# Patient Record
Sex: Female | Born: 1940 | Race: Black or African American | Hispanic: No | Smoking: Never smoker
Health system: Southern US, Community
[De-identification: ages and names within clinical notes are randomized; demographics above are authoritative.]

## PROBLEM LIST (undated history)

## (undated) DIAGNOSIS — I639 Cerebral infarction, unspecified: Secondary | ICD-10-CM

## (undated) DIAGNOSIS — I63512 Cerebral infarction due to unspecified occlusion or stenosis of left middle cerebral artery: Secondary | ICD-10-CM

## (undated) DIAGNOSIS — I071 Rheumatic tricuspid insufficiency: Secondary | ICD-10-CM

## (undated) DIAGNOSIS — I5032 Chronic diastolic (congestive) heart failure: Secondary | ICD-10-CM

## (undated) DIAGNOSIS — K8689 Other specified diseases of pancreas: Secondary | ICD-10-CM

## (undated) DIAGNOSIS — R001 Bradycardia, unspecified: Secondary | ICD-10-CM

## (undated) DIAGNOSIS — Z9889 Other specified postprocedural states: Secondary | ICD-10-CM

## (undated) DIAGNOSIS — I10 Essential (primary) hypertension: Secondary | ICD-10-CM

## (undated) DIAGNOSIS — I4892 Unspecified atrial flutter: Secondary | ICD-10-CM

## (undated) DIAGNOSIS — I340 Nonrheumatic mitral (valve) insufficiency: Secondary | ICD-10-CM

## (undated) DIAGNOSIS — R4701 Aphasia: Secondary | ICD-10-CM

## (undated) DIAGNOSIS — S7292XA Unspecified fracture of left femur, initial encounter for closed fracture: Secondary | ICD-10-CM

## (undated) DIAGNOSIS — I499 Cardiac arrhythmia, unspecified: Secondary | ICD-10-CM

## (undated) DIAGNOSIS — R160 Hepatomegaly, not elsewhere classified: Secondary | ICD-10-CM

## (undated) DIAGNOSIS — M1990 Unspecified osteoarthritis, unspecified site: Secondary | ICD-10-CM

## (undated) DIAGNOSIS — E785 Hyperlipidemia, unspecified: Secondary | ICD-10-CM

## (undated) HISTORY — DX: Nonrheumatic mitral (valve) insufficiency: I34.0

## (undated) HISTORY — PX: HIP SURGERY: SHX245

## (undated) HISTORY — PX: TOTAL KNEE ARTHROPLASTY: SHX125

## (undated) HISTORY — PX: LUMBAR SPINE SURGERY: SHX701

## (undated) HISTORY — DX: Cerebral infarction, unspecified: I63.9

## (undated) HISTORY — DX: Unspecified fracture of left femur, initial encounter for closed fracture: S72.92XA

## (undated) HISTORY — DX: Other specified diseases of pancreas: K86.89

## (undated) HISTORY — DX: Rheumatic tricuspid insufficiency: I07.1

## (undated) HISTORY — DX: Unspecified atrial flutter: I48.92

## (undated) HISTORY — DX: Hepatomegaly, not elsewhere classified: R16.0

## (undated) HISTORY — PX: KNEE ARTHROSCOPY: SUR90

## (undated) HISTORY — PX: TUBAL LIGATION: SHX77

## (undated) HISTORY — DX: Bradycardia, unspecified: R00.1

## (undated) HISTORY — DX: Cerebral infarction due to unspecified occlusion or stenosis of left middle cerebral artery: I63.512

## (undated) HISTORY — DX: Aphasia: R47.01

## (undated) HISTORY — DX: Unspecified osteoarthritis, unspecified site: M19.90

## (undated) HISTORY — DX: Essential (primary) hypertension: I10

## (undated) HISTORY — DX: Hyperlipidemia, unspecified: E78.5

## (undated) HISTORY — PX: JOINT REPLACEMENT: SHX530

---

## 1998-10-09 ENCOUNTER — Other Ambulatory Visit: Admission: RE | Admit: 1998-10-09 | Discharge: 1998-10-09 | Payer: Self-pay | Admitting: Family Medicine

## 1999-11-12 ENCOUNTER — Other Ambulatory Visit: Admission: RE | Admit: 1999-11-12 | Discharge: 1999-11-12 | Payer: Self-pay | Admitting: Family Medicine

## 2001-08-24 ENCOUNTER — Other Ambulatory Visit: Admission: RE | Admit: 2001-08-24 | Discharge: 2001-08-24 | Payer: Self-pay | Admitting: Family Medicine

## 2006-03-05 ENCOUNTER — Other Ambulatory Visit: Admission: RE | Admit: 2006-03-05 | Discharge: 2006-03-05 | Payer: Self-pay | Admitting: Family Medicine

## 2006-06-10 ENCOUNTER — Inpatient Hospital Stay (HOSPITAL_COMMUNITY): Admission: RE | Admit: 2006-06-10 | Discharge: 2006-06-13 | Payer: Self-pay | Admitting: Orthopedic Surgery

## 2006-07-06 ENCOUNTER — Encounter: Admission: RE | Admit: 2006-07-06 | Discharge: 2006-08-06 | Payer: Self-pay | Admitting: Orthopedic Surgery

## 2006-08-07 ENCOUNTER — Encounter: Admission: RE | Admit: 2006-08-07 | Discharge: 2006-09-10 | Payer: Self-pay | Admitting: Orthopedic Surgery

## 2006-12-17 ENCOUNTER — Emergency Department (HOSPITAL_COMMUNITY): Admission: EM | Admit: 2006-12-17 | Discharge: 2006-12-17 | Payer: Self-pay | Admitting: Emergency Medicine

## 2007-04-20 ENCOUNTER — Inpatient Hospital Stay (HOSPITAL_COMMUNITY): Admission: RE | Admit: 2007-04-20 | Discharge: 2007-04-21 | Payer: Self-pay | Admitting: Neurosurgery

## 2007-10-07 ENCOUNTER — Ambulatory Visit: Payer: Self-pay | Admitting: Gastroenterology

## 2007-10-19 ENCOUNTER — Ambulatory Visit: Payer: Self-pay | Admitting: Gastroenterology

## 2007-10-19 ENCOUNTER — Encounter: Payer: Self-pay | Admitting: Gastroenterology

## 2008-06-14 ENCOUNTER — Ambulatory Visit: Payer: Self-pay | Admitting: Cardiology

## 2008-07-11 ENCOUNTER — Encounter: Payer: Self-pay | Admitting: Cardiology

## 2008-07-11 ENCOUNTER — Ambulatory Visit: Payer: Self-pay

## 2008-08-16 ENCOUNTER — Ambulatory Visit: Payer: Self-pay | Admitting: Cardiology

## 2009-01-04 DIAGNOSIS — E785 Hyperlipidemia, unspecified: Secondary | ICD-10-CM | POA: Insufficient documentation

## 2009-01-04 DIAGNOSIS — I10 Essential (primary) hypertension: Secondary | ICD-10-CM | POA: Insufficient documentation

## 2009-01-09 ENCOUNTER — Ambulatory Visit: Payer: Self-pay | Admitting: Cardiology

## 2009-01-09 DIAGNOSIS — I080 Rheumatic disorders of both mitral and aortic valves: Secondary | ICD-10-CM | POA: Insufficient documentation

## 2009-01-09 DIAGNOSIS — E663 Overweight: Secondary | ICD-10-CM | POA: Insufficient documentation

## 2009-01-09 DIAGNOSIS — R001 Bradycardia, unspecified: Secondary | ICD-10-CM | POA: Insufficient documentation

## 2009-03-21 ENCOUNTER — Encounter: Payer: Self-pay | Admitting: Cardiology

## 2009-04-18 ENCOUNTER — Encounter (INDEPENDENT_AMBULATORY_CARE_PROVIDER_SITE_OTHER): Payer: Self-pay | Admitting: *Deleted

## 2009-06-26 ENCOUNTER — Ambulatory Visit: Payer: Self-pay | Admitting: Cardiology

## 2009-06-26 ENCOUNTER — Ambulatory Visit (HOSPITAL_COMMUNITY): Admission: RE | Admit: 2009-06-26 | Discharge: 2009-06-26 | Payer: Self-pay | Admitting: Cardiology

## 2009-06-26 ENCOUNTER — Ambulatory Visit: Payer: Self-pay

## 2009-06-26 ENCOUNTER — Encounter: Payer: Self-pay | Admitting: Cardiology

## 2009-07-10 ENCOUNTER — Telehealth: Payer: Self-pay | Admitting: Cardiology

## 2009-07-10 ENCOUNTER — Encounter: Payer: Self-pay | Admitting: Cardiology

## 2009-07-11 ENCOUNTER — Telehealth: Payer: Self-pay | Admitting: Cardiology

## 2009-07-31 ENCOUNTER — Telehealth: Payer: Self-pay | Admitting: Cardiology

## 2009-08-01 ENCOUNTER — Telehealth: Payer: Self-pay | Admitting: Cardiology

## 2009-08-07 ENCOUNTER — Encounter: Payer: Self-pay | Admitting: Cardiology

## 2009-08-15 ENCOUNTER — Ambulatory Visit: Payer: Self-pay | Admitting: Cardiology

## 2009-09-03 ENCOUNTER — Telehealth: Payer: Self-pay | Admitting: Cardiology

## 2009-12-06 ENCOUNTER — Telehealth (INDEPENDENT_AMBULATORY_CARE_PROVIDER_SITE_OTHER): Payer: Self-pay | Admitting: *Deleted

## 2009-12-10 ENCOUNTER — Encounter: Payer: Self-pay | Admitting: Cardiology

## 2009-12-10 ENCOUNTER — Telehealth: Payer: Self-pay | Admitting: Cardiology

## 2009-12-26 ENCOUNTER — Ambulatory Visit: Payer: Self-pay | Admitting: Cardiology

## 2010-01-21 ENCOUNTER — Telehealth: Payer: Self-pay | Admitting: Cardiology

## 2010-03-14 ENCOUNTER — Ambulatory Visit (HOSPITAL_COMMUNITY): Admission: RE | Admit: 2010-03-14 | Discharge: 2010-03-14 | Payer: Self-pay | Admitting: Orthopedic Surgery

## 2010-03-19 ENCOUNTER — Telehealth: Payer: Self-pay | Admitting: Cardiology

## 2010-05-07 ENCOUNTER — Ambulatory Visit: Payer: Self-pay | Admitting: Cardiology

## 2010-05-07 ENCOUNTER — Inpatient Hospital Stay (HOSPITAL_COMMUNITY): Admission: RE | Admit: 2010-05-07 | Discharge: 2010-05-11 | Payer: Self-pay | Admitting: Orthopedic Surgery

## 2010-05-13 ENCOUNTER — Telehealth (INDEPENDENT_AMBULATORY_CARE_PROVIDER_SITE_OTHER): Payer: Self-pay | Admitting: *Deleted

## 2010-05-29 ENCOUNTER — Encounter: Payer: Self-pay | Admitting: Internal Medicine

## 2010-05-29 ENCOUNTER — Ambulatory Visit: Payer: Self-pay | Admitting: Cardiology

## 2010-05-29 DIAGNOSIS — I480 Paroxysmal atrial fibrillation: Secondary | ICD-10-CM | POA: Insufficient documentation

## 2010-05-29 DIAGNOSIS — R55 Syncope and collapse: Secondary | ICD-10-CM | POA: Insufficient documentation

## 2010-05-30 ENCOUNTER — Ambulatory Visit: Payer: Self-pay | Admitting: Internal Medicine

## 2010-05-30 ENCOUNTER — Telehealth: Payer: Self-pay | Admitting: Internal Medicine

## 2010-06-03 ENCOUNTER — Encounter
Admission: RE | Admit: 2010-06-03 | Discharge: 2010-08-08 | Payer: Self-pay | Source: Home / Self Care | Attending: Orthopedic Surgery | Admitting: Orthopedic Surgery

## 2010-06-04 ENCOUNTER — Encounter: Payer: Self-pay | Admitting: Internal Medicine

## 2010-06-11 HISTORY — PX: ATRIAL ABLATION SURGERY: SHX560

## 2010-06-12 ENCOUNTER — Encounter: Payer: Self-pay | Admitting: Internal Medicine

## 2010-06-17 ENCOUNTER — Encounter: Payer: Self-pay | Admitting: Internal Medicine

## 2010-06-20 ENCOUNTER — Encounter: Payer: Self-pay | Admitting: Internal Medicine

## 2010-06-24 ENCOUNTER — Ambulatory Visit: Payer: Self-pay | Admitting: Internal Medicine

## 2010-06-24 ENCOUNTER — Ambulatory Visit (HOSPITAL_COMMUNITY): Admission: RE | Admit: 2010-06-24 | Discharge: 2010-06-25 | Payer: Self-pay | Admitting: Internal Medicine

## 2010-06-28 ENCOUNTER — Encounter: Payer: Self-pay | Admitting: Internal Medicine

## 2010-07-24 ENCOUNTER — Encounter: Payer: Self-pay | Admitting: Internal Medicine

## 2010-07-24 ENCOUNTER — Ambulatory Visit: Payer: Self-pay | Admitting: Internal Medicine

## 2010-09-10 NOTE — Progress Notes (Signed)
Summary: surgical clearance   Phone Note Call from Patient Call back at Home Phone [phone]   Caller: Patient Reason for Call: Talk to Nurse Summary of Call: having orth surgery on 8/15 w/ Dr Wynelle Link, needs surgical clearance Initial call taken by: Darnell Level,  January 21, 2010 12:32 PM  Follow-up for Phone Call        The patient would certainly have bradycardia before during and after the procedure.  To date this has not been clinically significant.  However, she would be a risk for symptomatic bradyarrhythmias.  She would need to be watched closely on telemetry and I would suggest doing this procedure at [facility] so that she can be closer to acute cardiology intervention if needed. Follow-up by: [name], MD, [facility],  January 22, 2010 1:23 PM

## 2010-09-10 NOTE — Progress Notes (Signed)
Summary: BP Management   Phone Note Other Incoming   Summary of Call: I received a phone call from Ronnald Collum NP at [facility] in regards to this pt.  She saw the pt last week for HTN.  BP 210/97 pulse regular.  The pt has a Hx of Bigeminy and Bradycardia and Metoprolol and Norvasc were discontinued in the past.  The pt's only BP medication is Benicar HCT 40/25mg  daily.  Ronnald Collum started the pt on Clonidine 0.1mg  two times a day last week.  The pt was seen today at [facility] and BP was 140/90, EKG showed bigeminy 57 per Ronnald Collum.  EKG was faxed to our office for review.  Ronnald Collum would like to know if she should keep the pt on Clonidine or try another BP agent.  I discussed this pt with Dr Olevia Perches (DOD).  He recommended that the pt taper Clonidine off and restart Norvasc 5mg  daily.  Dr Olevia Perches would like the pt to see Ronnald Collum NP near the end of the week to follow-up on these medication changes.  I did make Dr Olevia Perches aware that the pt has taken Norvasc in the past but he recommended that the pt retry this medication.  I spoke with Ronnald Collum and made her aware of Dr Nichola Sizer recommendations.  Initial call taken by: Theodosia Quay, RN, BSN,  Dec 10, 2009 1:52 PM     Appended Document: BP Management Discussed with the primary care provider and plan formulated.

## 2010-09-10 NOTE — Assessment & Plan Note (Signed)
Summary: [facility]  Medications Added LIPITOR 20 MG TABS (ATORVASTATIN CALCIUM) 1 by mouth daily VICODIN 5-500 MG TABS (HYDROCODONE-ACETAMINOPHEN) as needed      Allergies Added: NKDA  Visit Type:  Follow-up Primary Provider:  Ronnald Collum NP  CC:  Atrial Flutter.  History of Present Illness: The patient presents for followup after recent knee surgery. Following the surgery well and ambulating with physical therapy she had a syncopal episode. She was noted at that time to be in atrial flutter. The rate was somewhat fast but was controlled with medications. She was anticoagulated per orthopedic protocol. She presents today for followup. Unfortunately her Coumadin was stopped but she remains in flutter. She doesn't really feel this. She doesn't feel any palpitations and she has had no presyncope or syncope. She has had no chest pressure, neck or arm is clear she has had no shortness of breath, PND or orthopnea. She has had no weight gain.  Current Medications (verified): 1)  Lipitor 20 Mg Tabs (Atorvastatin Calcium) .Marland Kitchen.. 1 By Mouth Daily 2)  Benicar Hct 40-25 Mg Tabs (Olmesartan Medoxomil-Hctz) .Marland Kitchen.. 1 Tab By Mouth Once Daily 3)  Fish Oil   Oil (Fish Oil) .... 3 Tabs Once Daily 4)  Vitamin D 1000 Unit  Tabs (Cholecalciferol) .... Once Daily 5)  Calcium Carbonate-Vitamin D 600-400 Mg-Unit  Tabs (Calcium Carbonate-Vitamin D) .... Once Daily 6)  Spironolactone 25 Mg Tabs (Spironolactone) .Marland Kitchen.. 1 By Mouth Daily 7)  Amlodipine Besylate 10 Mg Tabs (Amlodipine Besylate) .... 1/2 By Mouth Daily 8)  Vicodin 5-500 Mg Tabs (Hydrocodone-Acetaminophen) .... As Needed  Allergies (verified): No Known Drug Allergies  Past History:  Past Medical History:  1. Hypertension since 1997.   2. Hyperlipidemia x5 years.   3. Mild to Mod MR  4. Bradycardia  5. Atrial flutter  6. Moderate TR  Past Surgical History:  1. Right knee arthroscopy.  2. Tubal ligation.  3. Lumbar back surgery  4. Left  knee surgery  Review of Systems       As stated in the HPI and negative for all other systems.   Vital Signs:  Patient profile:   70 year old female Height:      64 inches Weight:      180 pounds BMI:     31.01 Pulse rate:   110 / minute Resp:     16 per minute BP sitting:   110 / 64  (right arm)  Vitals Entered By: Levora Angel, CNA (May 29, 2010 1:09 PM)  Physical Exam  General:  Well developed, well nourished, in no acute distress. Head:  normocephalic and atraumatic Mouth:  Poor dentition,gums and palate normal. Oral mucosa normal. Neck:  Neck supple, no JVD. No masses, thyromegaly or abnormal cervical nodes. Chest Wall:  no deformities or breast masses noted Lungs:  Clear bilaterally to auscultation and percussion. Abdomen:  Bowel sounds positive; abdomen soft and non-tender without masses, organomegaly, or hernias noted. No hepatosplenomegaly. Msk:  Back normal, normal gait. Muscle strength and tone normal. Extremities:  No clubbing or cyanosis, mild edema left leg Neurologic:  Alert and oriented x 3. Skin:  Intact without lesions or rashes. Cervical Nodes:  no significant adenopathy Inguinal Nodes:  no significant adenopathy Psych:  Normal affect.   Detailed Cardiovascular Exam  Neck    Carotids: Carotids full and equal bilaterally without bruits.      Neck Veins: Normal, no JVD.    Heart    Inspection: no deformities or lifts noted.  Palpation: normal PMI with no thrills palpable.      Auscultation: irregular rate and rhythm, S1, S2 soft apical systolic murmur heard in the axilla and holosystolic, no diastolic murmurs  Vascular    Abdominal Aorta: no palpable masses, pulsations, or audible bruits.      Femoral Pulses: normal femoral pulses bilaterally.      Pedal Pulses: pulses normal in all 4 extremities    Radial Pulses: normal radial pulses bilaterally.      Peripheral Circulation: no clubbing, cyanosis, or edema noted with normal capillary  refill.     EKG  Procedure date:  05/29/2010  Findings:      atrial flutter with variable conduction, left axis deviation, left ventricular hypertrophy, RSR prime V1  Impression & Recommendations:  Problem # 1:  ATRIAL FLUTTER (ICD-427.32) Patient has persistent atrial flutter. I will refer her to eat keep saturation lesion. She understands she needs to restart anticoagulation in anticipation of this. She could not work Pradaxa so she will resume Coumadin. She so much wants to limit her time on Coumadin therapy including a TEE would be desirable but I will defer to her electrophysiologist. I will arrange for Coumadin followup at her primary care office.  Problem # 2:  MITRAL REGURGITATION (ICD-396.3) This has been mild to moderate and I will follow this clinically with repeat echoes in the future.  Problem # 3:  HYPERTENSION (ICD-401.9) Bpressure is 12. She will continue the meds as listed.  Problem # 4:  SYNCOPE (ICD-780.2) In the hospital this appeared to be vagal and not related to arrhythmia.  Other Orders: EKG w/ Interpretation (93000) EP Referral (Cardiology EP Ref )  Patient Instructions: 1)  Your physician recommends that you schedule a follow-up appointment in: 6 months with Dr Percival Spanish in [place] 2)  Your physician recommends that you continue on your current medications as directed. Please refer to the Current Medication list given to you today. 3)  Your physician recommends that you have  lab work:  PT/INR at [facility] Monday June 03, 2010 at 12:20 4)  You have been referred to EP for atrial fib/flutter ablation

## 2010-09-10 NOTE — Progress Notes (Signed)
Summary: calling re surgical clearence   Phone Note Call from Patient   Caller: Patient Reason for Call: Talk to Nurse Summary of Call: pt calling to see if we recieved surgical clearence from [place]? pls call [phone] Initial call taken by: Lorenda Hatchet,  March 19, 2010 8:35 AM  Follow-up for Phone Call        I spoke with the pt and made her aware that Dr Percival Spanish cleared her for surgery in June (phone note).  The pt is scheduled for surgery on 8/15 with Dr Maureen Ralphs at [facility].  I made the pt aware that Dr Percival Spanish recommended performing surgery at [facility].  I will fax phone note to Dr Alusio's office and they can make further decisions about surgery.  Follow-up by: Theodosia Quay, RN, BSN,  March 19, 2010 8:55 AM

## 2010-09-10 NOTE — Progress Notes (Signed)
Summary: QUESTION ON MEDS   Phone Note Call from Patient Call back at Home Phone [phone]   Caller: Patient Reason for Call: Talk to Nurse Summary of Call: PT HAS QUESTION ON MEDS. Initial call taken by: Regan Lemming,  May 30, 2010 3:16 PM  Follow-up for Phone Call        Pt is going to start Pradaxa 150mg  two times a day and we will sch her ablation for 06/24/10.  will call pt to sch labs  pt aware Janan Halter, RN, BSN  May 30, 2010 4:37 PM

## 2010-09-10 NOTE — Progress Notes (Signed)
   Lov faxed to [facility]  December 06, 2009 2:43 PM

## 2010-09-10 NOTE — Procedures (Signed)
Summary: Holter monitor order  Holter monitor order   Imported By: Jamelle Haring 08/13/2009 08:43:33  _____________________________________________________________________  External Attachment:    Type:   Image     Comment:   External Document

## 2010-09-10 NOTE — Assessment & Plan Note (Signed)
Summary: Brooke Chavez  Medications Added SPIRONOLACTONE 25 MG TABS (SPIRONOLACTONE) 1 by mouth daily AMLODIPINE BESYLATE 10 MG TABS (AMLODIPINE BESYLATE) 1/2 by mouth daily      Allergies Added: NKDA  Visit Type:  Follow-up Primary Provider:  Ronnald Collum NP  CC:  Bradycardia.  History of Present Illness: The patient was referred back to discuss bradycardia. She still noted to have heart rates in the 40s. Today she is having premature ectopic complexes as well. However, she remains asymptomatic. She has had no presyncope or syncope. She has no weakness or lightheadedness. She has no chest discomfort or shortness of breath. She has been evaluated in the past with a Holter monitor demonstrating no significant symptomatic bradycardia arrhythmias or pauses.  Current Medications (verified): 1)  Lipitor 40 Mg Tabs (Atorvastatin Calcium) .... Take One Tablet By Mouth Daily. 2)  Benicar Hct 40-25 Mg Tabs (Olmesartan Medoxomil-Hctz) .Marland Kitchen.. 1 Tab By Mouth Once Daily 3)  Fish Oil   Oil (Fish Oil) .... 3 Tabs Once Daily 4)  Vitamin D 1000 Unit  Tabs (Cholecalciferol) .... Once Daily 5)  Calcium Carbonate-Vitamin D 600-400 Mg-Unit  Tabs (Calcium Carbonate-Vitamin D) .... Once Daily 6)  Spironolactone 25 Mg Tabs (Spironolactone) .Marland Kitchen.. 1 By Mouth Daily 7)  Amlodipine Besylate 10 Mg Tabs (Amlodipine Besylate) .... 1/2 By Mouth Daily  Allergies (verified): No Known Drug Allergies  Past History:  Past Medical History: Reviewed history from 01/09/2009 and no changes required.  1. Hypertension since 1997.   2. Hyperlipidemia x5 years.   3. Mild to Mod MR  4. Bradycardia  Past Surgical History: Reviewed history from 01/09/2009 and no changes required.  1. Right knee arthroscopy.  2. Tubal ligation.  3. Lumbar back surgery  Review of Systems       As stated in the HPI and negative for all other systems.   Vital Signs:  Patient profile:   70 year old female Height:      64  inches Weight:      181 pounds BMI:     31.18 Pulse rate:   62 / minute Resp:     16 per minute BP sitting:   124 / 60  (right arm)  Vitals Entered By: Levora Angel, CNA (Dec 26, 2009 11:44 AM)  Physical Exam  General:  Well developed, well nourished, in no acute distress. Head:  normocephalic and atraumatic Neck:  Neck supple, no JVD. No masses, thyromegaly or abnormal cervical nodes. Chest Wall:  no deformities or breast masses noted Lungs:  Clear bilaterally to auscultation and percussion. Heart:  Non-displaced PMI, chest non-tender; regular rate and rhythm, S1, S2 without murmurs, rubs or gallops. Carotid upstroke normal, no bruit. Normal abdominal aortic size, no bruits. Femorals normal pulses, no bruits. Pedals normal pulses. No edema, no varicosities. Abdomen:  Bowel sounds positive; abdomen soft and non-tender without masses, organomegaly, or hernias noted. No hepatosplenomegaly. Msk:  Back normal, normal gait. Muscle strength and tone normal. Extremities:  No clubbing or cyanosis. Neurologic:  Alert and oriented x 3. Psych:  Normal affect.   EKG  Procedure date:  12/26/2009  Findings:      junctional escape beats, premature ectopic complexes, left axis deviation, left ventricular hypertrophy  Impression & Recommendations:  Problem # 1:  BRADYCARDIA (ICD-427.89) I reviewed the previous Holter results as well as the most recent EKG. There were clear P waves with sinus bradycardia on the most recent EKG. Right now she has junctional beats with premature ectopic complexes frequently.  She's had junctional rhythm in the past. Its narrow complex. 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 At this point she remains completely asymptomatic. No further therapy is indicated at this point the we have had a long discussion about the symptoms that could occur that would prompt further evaluation. Orders: EKG w/ Interpretation (93000)  Problem # 2:  HYPERTENSION  (ICD-401.9) Her blood pressure is controlled. She will continue the meds as listed. Orders: EKG w/ Interpretation (93000)  Problem # 3:  MITRAL REGURGITATION (ICD-396.3) I will follow this clinically over time.  Patient Instructions: 1)  Your physician recommends that you schedule a follow-up appointment in: i yr with Dr Percival Spanish 2)  Your physician recommends that you continue on your current medications as directed. Please refer to the Current Medication list given to you today.

## 2010-09-10 NOTE — Assessment & Plan Note (Signed)
Summary: discuss flutter ablation.  Medications Added CARDIZEM CD 120 MG XR24H-CAP (DILTIAZEM HCL COATED BEADS) one by mouth daily PRADAXA 150 MG CAPS (DABIGATRAN ETEXILATE MESYLATE) one by mouth bid        Visit Type:  Initial Consult Referring Provider:  Dr Percival Spanish Primary Provider:  Ronnald Collum NP ([facility])   History of Present Illness: Brooke Chavez is a pleasant 70 yo AAF with a h/o HTN, DJD, and recently diagnosed atrial flutter who presents today for EP consultation.  She is recovering from recent L total knee arthroplasy surgery 05/07/10.  She states that on post operative day 2, while walking with physical therapy, she became weak and collapsed.  She reports loss of consciousness for several seconds but took several minutes to regain composure.  Review of discharge summary suggests that she was in atrial flutter with V rates of 100 bpm at that time.  She recalls having severe pain while ambulating and things that this causes her to "pass out".   She was discharged and has done well since that time.  She reports fatigue and decreased exercise tolerance.  She is not sure if this is atrial flutter or recovery from surgery. The patient denies symptoms of palpitations, chest pain, shortness of breath, orthopnea, PND, lower extremity edema, dizziness, presyncope,  further syncope, or neurologic sequela. The patient is tolerating medications without difficulties and is otherwise without complaint today.    Allergies: No Known Drug Allergies  Past History:  Past Medical History:  1. Hypertension since 1997.   2. Hyperlipidemia x5 years.   3. Mild to Mod MR  4. Bradycardia  5. Moderate TR with RA enlargement  6. Atrial flutter, typical  by EKG diagnosed 9/11  7. DJD  Past Surgical History:  1. Right knee arthroscopy.  2. Tubal ligation.  3. Lumbar back surgery  4. L total knee arthroplasty  Family History: Reviewed history from 01/04/2009 and no changes  required. Father deceased at age 65 cause is unknown.  The patient's  father died when she was young.  Mother deceased at age 23 with a history of  hypertension.  Social History: Pt lives in [place].   Her daughter lives with her.  The patient denies any tobacco products or alcohol products.  Review of Systems       All systems are reviewed and negative except as listed in the HPI.   Vital Signs:  Patient profile:   70 year old female Height:      64 inches Weight:      181 pounds BMI:     31.18 Pulse rate:   91 / minute BP sitting:   124 / 78  (left arm)  Vitals Entered By: Margaretmary Bayley CMA (May 30, 2010 9:00 AM)  Physical Exam  General:  walks with a cane, overweight Head:  normocephalic and atraumatic Eyes:  PERRLA/EOM intact; conjunctiva and lids normal. Mouth:  Teeth, gums and palate normal. Oral mucosa normal. Neck:  Neck supple, no JVD. No masses, thyromegaly or abnormal cervical nodes. Lungs:  Clear bilaterally to auscultation and percussion. Heart:  iRRR, 2/6 LLSB Abdomen:  Bowel sounds positive; abdomen soft and non-tender without masses, organomegaly, or hernias noted. No hepatosplenomegaly. Msk:  walks with a cane Pulses:  pulses normal in all 4 extremities Extremities:  No clubbing or cyanosis. Neurologic:  Alert and oriented x 3. Skin:  Intact without lesions or rashes. Psych:  Normal affect.   Echocardiogram  Procedure date:  07/11/2008  Findings:        -  Overall left ventricular systolic function was normal. Left         ventricular ejection fraction was estimated , range being 55         % to 60 %. There were no left ventricular regional wall         motion abnormalities.   -  There was mild to moderate thickening of the mitral valve. There         was mild to moderate mitral valvular regurgitation. The         effective orifice of mitral regurgitation by proximal         isovelocity surface area was 0.13 cm^2. The volume of mitral          regurgitation by proximal isovelocity surface area was 28 cc.   -  The left atrium was moderately dilated.   -  The estimated peak pulmonary artery systolic pressure was mildly         increased.   -  The right atrium was mildly dilated.  CXR  Procedure date:  04/19/2007  Findings:      Mild thoracic spondylosis. Midline trachea. Mild   cardiomegaly. Normal   mediastinal contours.    Sharp costophrenic angles. No pneumothorax or congestive failure.    Minimal right base subsegmental atelectasis or scarring.    IMPRESSION     1. Cardiomegaly without acute cardiopulmonary disease.    EKG  Procedure date:  05/30/2010  Findings:      typical appearing atrial flutter, V rate 110 bpm, IVCD  Impression & Recommendations:  Problem # 1:  ATRIAL FLUTTER (ICD-427.32) The patient has typical appearing atrial flutter by EKG today.  She reports symptoms of fatigue.  Her CHADSVASc score is 3.  She therefore should be chronically anticoagulated with either coumadin or pradaxa.  I have suggested catheter ablation as an alternative to chronic anticoagulation. Risk, benefits, and alternatives to EP study and radiofrequency ablation for atrial flutter were also discussed in detail today. These risks include but are not limited to stroke, bleeding, vascular damage, tamponade, perforation, damage to the esophagus, lungs, and other structions, AV block requiring a pacemaker, worsening renal function, and death. The patient understands these risk and wishes to proceed.  We will therefore initiate anticoagulation at this time.  (She was initiated on coumadin yesterday but did not start taking the medicine as prescribed.  She wishes to check on her copay with pradaxa and would prefer pradaxa at this time).  Once she has been appropriately anticoagulted with either coumadin or pradaxa, we will proceed with ablation.  I have started cardizem 120mg  daily for rate control today.  Problem # 2:  SYNCOPE  (ICD-780.2) likely vagally mediated. no further workup planned at this time  Problem # 3:  HYPERTENSION (ICD-401.9) stable  Problem # 4:  BRADYCARDIA (ICD-427.89) we will stop cardizem post ablation  Patient Instructions: 1)  Your physician has recommended that you have an ablation.  Catheter ablation is a medical procedure used to treat some cardiac arrhythmias (irregular heartbeats). During catheter ablation, a long, thin, flexible tube is put into a blood vessel in your groin (upper thigh), or neck. This tube is called an ablation catheter. It is then guided to your heart through the blood vessel. Radiofrequency waves destroy small areas of heart tissue where abnormal heartbeats may cause an arrhythmia to start.  Please see the instruction sheet given to you today. 2)  Your physician has recommended you make the following change in your medication: start Cardizem 120mg  daily, start Pradaxa 150mg  one by mouth two times a day Prescriptions: PRADAXA 150 MG CAPS (DABIGATRAN ETEXILATE MESYLATE) one by mouth bid  #60 x 3   Entered by:   Janan Halter, RN, BSN   Authorized by:   Thompson Grayer, MD   Signed by:   Janan Halter, RN, BSN on 05/30/2010   Method used:   Electronically to        [facility] [phone]* (retail)       [place], [place]  [address]       Ph: [phone] or [phone]       Fax: [phone]   RxID:   [id number] CARDIZEM CD 120 MG XR24H-CAP (DILTIAZEM HCL COATED BEADS) one by mouth daily  #30 x 6   Entered by:   Janan Halter, RN, BSN   Authorized by:   Thompson Grayer, MD   Signed by:   Janan Halter, RN, BSN on 05/30/2010   Method used:   Electronically to        [facility] [phone]* (retail)       [place], [place]  [address]       Ph: [phone] or [phone]       Fax: [phone]   RxID:   [id number] CARDIZEM CD 120 MG XR24H-CAP (DILTIAZEM HCL COATED  BEADS) one by mouth daily  #30 x 6   Entered by:   Janan Halter, RN, BSN   Authorized by:   Thompson Grayer, MD   Signed by:   Janan Halter, RN, BSN on 05/30/2010   Method used:   Electronically to        [facility]* (retail)       [address]       [place], [place]  [address]       Ph: [phone]       Fax: [phone]   RxID:   [id number]

## 2010-09-10 NOTE — Assessment & Plan Note (Signed)
Summary: Brooke Chavez      Allergies Added: NKDA  Visit Type:  Follow-up Primary Provider:  Ronnald Collum NP  CC:  HTN and Bradycardia.  History of Present Illness: The patient presents for followup of the above. After the last appointment she wore a Holter monitor. This demonstrated sinus rhythm, premature atrial contractions and short runs of atrial tachycardia and occasional runs of a junctional rhythm or sinus bradycardia. She had no sustained pauses or severe symptoms. She had no presyncope or syncope. She felt no palpitations. She has been doing relatively well. She denies any chest pressure, neck or arm discomfort. She has had no shortness of breath, PND or orthopnea.  Of note she brings a blood pressure diary today as requested. Her typical blood pressure is above 140 occasionally with systolics in the [id number]. However, she did not tolerate Aldactone when I try that.  Current Medications (verified): 1)  Lipitor 40 Mg Tabs (Atorvastatin Calcium) .... Take One Tablet By Mouth Daily. 2)  Benicar Hct 40-25 Mg Tabs (Olmesartan Medoxomil-Hctz) .Marland Kitchen.. 1 Tab By Mouth Once Daily 3)  Fish Oil   Oil (Fish Oil) .... 3 Tabs Once Daily 4)  Vitamin D 1000 Unit  Tabs (Cholecalciferol) .... Once Daily 5)  Calcium Carbonate-Vitamin D 600-400 Mg-Unit  Tabs (Calcium Carbonate-Vitamin D) .... Once Daily  Allergies (verified): No Known Drug Allergies  Past History:  Past Medical History: Reviewed history from 01/09/2009 and no changes required.  1. Hypertension since 1997.   2. Hyperlipidemia x5 years.   3. Mild to Mod MR  4. Bradycardia  Past Surgical History: Reviewed history from 01/09/2009 and no changes required.  1. Right knee arthroscopy.  2. Tubal ligation.  3. Lumbar back surgery  Review of Systems       As stated in the HPI and negative for all other systems.   Vital Signs:  Patient profile:   70 year old female Height:      64 inches Weight:      186 pounds BMI:      32.04 Pulse rate:   48 / minute Resp:     16 per minute BP sitting:   146 / 74  (right arm)  Vitals Entered By: Levora Angel, CNA (August 15, 2009 3:31 PM)  Physical Exam  General:  Well developed, well nourished, in no acute distress. Head:  normocephalic and atraumatic Eyes:  PERRLA/EOM intact; conjunctiva and lids normal. Mouth:  Teeth, gums and palate normal. Oral mucosa normal. Neck:  Neck supple, no JVD. No masses, thyromegaly or abnormal cervical nodes. Chest Wall:  no deformities or breast masses noted Lungs:  Clear bilaterally to auscultation and percussion. Abdomen:  Bowel sounds positive; abdomen soft and non-tender without masses, organomegaly, or hernias noted. No hepatosplenomegaly. Msk:  Back normal, normal gait. Muscle strength and tone normal. Pulses:  pulses normal in all 4 extremities Extremities:  No clubbing or cyanosis. Neurologic:  Alert and oriented x 3. Skin:  Intact without lesions or rashes. Cervical Nodes:  no significant adenopathy Axillary Nodes:  no significant adenopathy Inguinal Nodes:  no significant adenopathy Psych:  Normal affect.   Detailed Cardiovascular Exam  Neck    Carotids: Carotids full and equal bilaterally without bruits.      Neck Veins: Normal, no JVD.    Heart    Inspection: no deformities or lifts noted.      Palpation: normal PMI with no thrills palpable.      Auscultation: regular rate and rhythm, S1,  S2 soft apical systolic murmur heard in the axilla and holosystolic, no diastolic murmurs  Vascular    Abdominal Aorta: no palpable masses, pulsations, or audible bruits.      Femoral Pulses: normal femoral pulses bilaterally.      Pedal Pulses: pulses normal in all 4 extremities    Radial Pulses: normal radial pulses bilaterally.      Peripheral Circulation: no clubbing, cyanosis, or edema noted with normal capillary refill.     Impression & Recommendations:  Problem # 1:  HYPERTENSION (ICD-401.9) Her blood pressure  is not at target. Unfortunately she hasn't tolerated amlodipine or Aldactone. I don't want to use medications that might worsen bradycardia. I don't want to use 4 times daily drugs or branded drugs because of cost. Therefore, our medication options are limited. Rather I had a frank discussion with her about therapeutic lifestyle changes that could keep [place] from needing to add another medicine. This should include increased physical activity and I gave her specific instructions about exercise. In addition I've instructed 10 pounds of weight loss with specific dietary instructions. (Greater than half hour with this discussion).  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 No further testing is indicated. Should she have any palpitations, presyncope or syncope I would be happy to readdress this.  Problem # 3:  MITRAL REGURGITATION (ICD-396.3) This he is mild to moderate and can be followed clinically.  Problem # 4:  OVERWEIGHT (ICD-278.02) As above.  Patient Instructions: 1)  Your physician recommends that you schedule a follow-up appointment in: 6 months 2)  Your physician recommends that you continue on your current medications as directed. Please refer to the Current Medication list given to you today. 3)  Your physician encouraged you to lose weight for better health. 4)  Your physician discussed the importance of regular exercise and recommended that you start or continue a regular exercise program for good health. Increase walking

## 2010-09-10 NOTE — Progress Notes (Signed)
Summary: REFILL--Benicar  Medications Added METOPROLOL TARTRATE 50 MG TABS (METOPROLOL TARTRATE) Take one tablet by mouth twice a day METOPROLOL TARTRATE 50 MG TABS (METOPROLOL TARTRATE) Take one tablet by mouth twice a day AMLODIPINE BESYLATE 10 MG TABS (AMLODIPINE BESYLATE) Take one tablet by mouth daily AMLODIPINE BESYLATE 10 MG TABS (AMLODIPINE BESYLATE) Take one tablet by mouth daily LIPITOR 40 MG TABS (ATORVASTATIN CALCIUM) Take one tablet by mouth daily. BENICAR HCT 40-25 MG TABS (OLMESARTAN MEDOXOMIL-HCTZ) 1 tab by mouth once daily FISH OIL   OIL (FISH OIL) 3 tabs once daily VITAMIN D 1000 UNIT  TABS (CHOLECALCIFEROL) once daily CALCIUM CARBONATE-VITAMIN D 600-400 MG-UNIT  TABS (CALCIUM CARBONATE-VITAMIN D) once daily SPIRONOLACTONE 50 MG TABS (SPIRONOLACTONE) one by mouth daily SPIRONOLACTONE 50 MG TABS (SPIRONOLACTONE) one by mouth daily       Phone Note Refill Request Message from:  Patient on September 03, 2009 8:55 AM  Refills Requested: Medication #1:  BENICAR HCT 40-25 MG TABS 1 tab by mouth once daily [facility] [id number]  Initial call taken by: Delsa Sale,  September 03, 2009 8:55 AM  Follow-up for Phone Call        Rx sent into pharmacy. Pt notiified. Levora Angel, CNA  September 03, 2009 9:09 AM  Follow-up by: Levora Angel, CNA,  September 03, 2009 9:09 AM    Prescriptions: BENICAR HCT 40-25 MG TABS (OLMESARTAN MEDOXOMIL-HCTZ) 1 tab by mouth once daily  #30 x 11   Entered by:   Levora Angel, CNA   Authorized by:   [name], MD, [facility]   Signed by:   Levora Angel, CNA on 09/03/2009   Method used:   Electronically to        [facility] (retail)       [address]       [place], [place]  [address]       Ph: [phone]       Fax: [phone]   RxID:   [id number]

## 2010-09-10 NOTE — Letter (Signed)
Summary: ELectrophysiology/Ablation Procedure Instructions  [facility] [place]  [id number]   [place], [place] [address]   Phone: [phone]  Fax: [phone]     Electrophysiology/Ablation Procedure Instructions    You are scheduled for a(n) flutter ablation on 06/24/10 at 12:30pm with [name].  1.  Please come to the [facility] at 10:30am on the day of your procedure.  2.  Come prepared to stay overnight.   Please bring your insurance cards and a list of your medications.  3.  Come to [facility] office on [facility] 06/17/10 for lab work.  You do not have to be fasting.  4.  Do not have anything to eat or drink after midnight the night before your procedure.  5.   All of your  medications may be taken with a small amount of water.  6.  Educational material received:  Ablation   * Occasionally, EP studies and ablations can become lengthy.  Please make your family aware of this before your procedure starts.  Average time ranges from 2-8 hours for EP studies/ablations.  Your physician will locate your family after the procedure with the results.  * If you have any questions after you get home, please call the office at [phone]. Leonia Reader

## 2010-09-10 NOTE — Progress Notes (Signed)
Summary: talk to nurse   Phone Note Call from Patient Call back at Home Phone [phone]   Caller: Patient Summary of Call: pt was told when she left the hospital that she would be on HCTZ 25mg , but does not have a script. please call the pt to discuss this.  Initial call taken by: Lorraine Lax,  May 13, 2010 1:38 PM  Follow-up for Phone Call        Pt calls today with question about HCTZ.  Her dc instruction sheet listed this as new but she did not have a prescription for this given to her at discharge.  She called her pharmacy and none was called in for her.  She was told to call Dr. Rosezella [place] office. She did not think Dr. Maureen Ralphs ordered it and did not want to call his office. Her bp today is 119/70 she is currently taking amlodipine 5 mg daily and Benicar 40/25 mg daily.  She does not want to start another new bp med if she doesn't have to.  I told her Dr. Percival Spanish would be out of the office until Thursday.  I will send him this message today.     Appended Document: talk to nurse No need to start [place]: talk to nurse pt aware

## 2010-09-12 NOTE — Medication Information (Signed)
Summary: Pradaxa  Pradaxa   Imported By: Marilynne Drivers 08/07/2010 10:45:10  _____________________________________________________________________  External Attachment:    Type:   Image     Comment:   External Document

## 2010-09-12 NOTE — Assessment & Plan Note (Signed)
Summary: eph/post ablation/Brooke Chavez/appt at 1:45  Medications Added LIPITOR 40 MG TABS (ATORVASTATIN CALCIUM) Take one tablet by mouth daily.      Allergies Added: NKDA  Referring Brooke Chavez:  Dr Brooke Chavez Primary Brooke Chavez:  Brooke Collum NP ([place])   History of Present Illness: The patient presents today for routine electrophysiology followup. She reports doing very well since her atrial flutter ablation. The patient denies symptoms of palpitations, chest pain, shortness of breath, orthopnea, PND, lower extremity edema, dizziness, presyncope, syncope, or neurologic sequela. The patient is tolerating medications without difficulties and is otherwise without complaint today.   Current Medications (verified): 1)  Lipitor 40 Mg Tabs (Atorvastatin Calcium) .... Take One Tablet By Mouth Daily. 2)  Benicar Hct 40-25 Mg Tabs (Olmesartan Medoxomil-Hctz) .Marland Kitchen.. 1 Tab By Mouth Once Daily 3)  Fish Oil   Oil (Fish Oil) .... 3 Tabs Once Daily 4)  Vitamin D 1000 Unit  Tabs (Cholecalciferol) .... Once Daily 5)  Calcium Carbonate-Vitamin D 600-400 Mg-Unit  Tabs (Calcium Carbonate-Vitamin D) .... Once Daily 6)  Spironolactone 25 Mg Tabs (Spironolactone) .Marland Kitchen.. 1 By Mouth Daily 7)  Amlodipine Besylate 10 Mg Tabs (Amlodipine Besylate) .... 1/2 By Mouth Daily 8)  Vicodin 5-500 Mg Tabs (Hydrocodone-Acetaminophen) .... As Needed 9)  Pradaxa 150 Mg Caps (Dabigatran Etexilate Mesylate) .... One By Mouth Bid  Allergies (verified): No Known Drug Allergies  Past History:  Past Medical History:  1. Hypertension since 1997.   2. Hyperlipidemia x5 years.   3. Mild to Mod MR  4. Bradycardia  5. Moderate TR with RA enlargement  6. Atrial flutter, typical  by EKG diagnosed 9/11 s/p CIT ablation 11/11  7. DJD  Past Surgical History:  1. Right knee arthroscopy.  2. Tubal ligation.  3. Lumbar back surgery  4. L total knee arthroplasty  5. Atrial flutter ablation 11/11  Social  History: Reviewed history from 05/30/2010 and no changes required. Pt lives in [place].   Her daughter lives with her.  The patient denies any tobacco products or alcohol products.  Review of Systems       All systems are reviewed and negative except as listed in the HPI.   Vital Signs:  Patient profile:   70 year old female Height:      64 inches Weight:      181 pounds BMI:     31.18 Pulse rate:   58 / minute BP sitting:   130 / 70  (left arm)  Vitals Entered By: Margaretmary Bayley CMA (July 24, 2010 2:49 PM)  Physical Exam  General:  walks with a cane, overweight Head:  normocephalic and atraumatic Eyes:  PERRLA/EOM intact; conjunctiva and lids normal. Mouth:  Teeth, gums and palate normal. Oral mucosa normal. Neck:  Neck supple, no JVD. No masses, thyromegaly or abnormal cervical nodes. Lungs:  Clear bilaterally to auscultation and percussion. Heart:  brady RR, 2/6 LLSB Abdomen:  Bowel sounds positive; abdomen soft and non-tender without masses, organomegaly, or hernias noted. No hepatosplenomegaly. Msk:  walks with a cane Extremities:  No clubbing or cyanosis. Neurologic:  Alert and oriented x 3.   EKG  Procedure date:  07/24/2010  Findings:      junctional rhythm 58 bpm with occasional sinus beats.  With casual ambulation in the hall, her heart rate increased to 80s.  Follow-up EKG (after resting for several minutes while hooking up ekg machine) revealed sinus rhythm 68 bpm.  Impression & Recommendations:  Problem # 1:  ATRIAL  FLUTTER (ICD-427.32) doing well s/p ablation stop pradaxa  Problem # 2:  BRADYCARDIA (ICD-427.89) asymptomatic she has sinus node dysfunction, but appears to be tolerating this without difficulty her sinus node functions nicely with ambulation she wishes to avoid PPM and at this time I would agree that she does not meet indications for pacing  Problem # 3:  HYPERTENSION (ICD-401.9) stable follow-up with Dr Brooke Chavez  Patient  Instructions: 1)  Your physician recommends that you schedule a follow-up appointment with Dr Brooke Chavez 2)  Your physician has recommended you make the following change in your medication: stop Pradaxa

## 2010-10-24 LAB — CBC
HCT: 31.1 % — ABNORMAL LOW (ref 36.0–46.0)
HCT: 33.1 % — ABNORMAL LOW (ref 36.0–46.0)
HCT: 35.3 % — ABNORMAL LOW (ref 36.0–46.0)
HCT: 42.3 % (ref 36.0–46.0)
Hemoglobin: 10.6 g/dL — ABNORMAL LOW (ref 12.0–15.0)
Hemoglobin: 11.3 g/dL — ABNORMAL LOW (ref 12.0–15.0)
Hemoglobin: 12.1 g/dL (ref 12.0–15.0)
Hemoglobin: 14.8 g/dL (ref 12.0–15.0)
MCH: 31.8 pg (ref 26.0–34.0)
MCH: 32.2 pg (ref 26.0–34.0)
MCH: 32.4 pg (ref 26.0–34.0)
MCH: 33 pg (ref 26.0–34.0)
MCHC: 34.1 g/dL (ref 30.0–36.0)
MCHC: 34.1 g/dL (ref 30.0–36.0)
MCHC: 34.3 g/dL (ref 30.0–36.0)
MCHC: 35 g/dL (ref 30.0–36.0)
MCV: 92.7 fL (ref 78.0–100.0)
MCV: 94.4 fL (ref 78.0–100.0)
MCV: 94.5 fL (ref 78.0–100.0)
MCV: 94.8 fL (ref 78.0–100.0)
Platelets: 163 10*3/uL (ref 150–400)
Platelets: 165 10*3/uL (ref 150–400)
Platelets: 196 10*3/uL (ref 150–400)
Platelets: 224 10*3/uL (ref 150–400)
RBC: 3.29 MIL/uL — ABNORMAL LOW (ref 3.87–5.11)
RBC: 3.49 MIL/uL — ABNORMAL LOW (ref 3.87–5.11)
RBC: 3.81 MIL/uL — ABNORMAL LOW (ref 3.87–5.11)
RBC: 4.48 MIL/uL (ref 3.87–5.11)
RDW: 13.1 % (ref 11.5–15.5)
RDW: 13.1 % (ref 11.5–15.5)
RDW: 13.2 % (ref 11.5–15.5)
RDW: 13.3 % (ref 11.5–15.5)
WBC: 10.8 10*3/uL — ABNORMAL HIGH (ref 4.0–10.5)
WBC: 13.4 10*3/uL — ABNORMAL HIGH (ref 4.0–10.5)
WBC: 13.4 10*3/uL — ABNORMAL HIGH (ref 4.0–10.5)
WBC: 7.7 10*3/uL (ref 4.0–10.5)

## 2010-10-24 LAB — COMPREHENSIVE METABOLIC PANEL
ALT: 27 U/L (ref 0–35)
AST: 37 U/L (ref 0–37)
Albumin: 4 g/dL (ref 3.5–5.2)
Alkaline Phosphatase: 64 U/L (ref 39–117)
BUN: 20 mg/dL (ref 6–23)
CO2: 27 mEq/L (ref 19–32)
Calcium: 10.2 mg/dL (ref 8.4–10.5)
Chloride: 107 mEq/L (ref 96–112)
Creatinine, Ser: 1.25 mg/dL — ABNORMAL HIGH (ref 0.4–1.2)
GFR calc Af Amer: 51 mL/min — ABNORMAL LOW (ref >60)
GFR calc non Af Amer: 42 mL/min — ABNORMAL LOW (ref >60)
Glucose, Bld: 86 mg/dL (ref 70–99)
Potassium: 4.1 mEq/L (ref 3.5–5.1)
Sodium: 139 mEq/L (ref 135–145)
Total Bilirubin: 0.7 mg/dL (ref 0.3–1.2)
Total Protein: 7.2 g/dL (ref 6.0–8.3)

## 2010-10-24 LAB — BASIC METABOLIC PANEL
BUN: 10 mg/dL (ref 6–23)
BUN: 11 mg/dL (ref 6–23)
BUN: 8 mg/dL (ref 6–23)
CO2: 26 mEq/L (ref 19–32)
CO2: 29 mEq/L (ref 19–32)
CO2: 29 mEq/L (ref 19–32)
Calcium: 8.6 mg/dL (ref 8.4–10.5)
Calcium: 8.7 mg/dL (ref 8.4–10.5)
Calcium: 9 mg/dL (ref 8.4–10.5)
Chloride: 104 mEq/L (ref 96–112)
Chloride: 105 mEq/L (ref 96–112)
Chloride: 105 mEq/L (ref 96–112)
Creatinine, Ser: 1.22 mg/dL — ABNORMAL HIGH (ref 0.4–1.2)
Creatinine, Ser: 1.22 mg/dL — ABNORMAL HIGH (ref 0.4–1.2)
Creatinine, Ser: 1.23 mg/dL — ABNORMAL HIGH (ref 0.4–1.2)
GFR calc Af Amer: 52 mL/min — ABNORMAL LOW (ref >60)
GFR calc Af Amer: 53 mL/min — ABNORMAL LOW (ref >60)
GFR calc Af Amer: 53 mL/min — ABNORMAL LOW (ref >60)
GFR calc non Af Amer: 43 mL/min — ABNORMAL LOW (ref >60)
GFR calc non Af Amer: 44 mL/min — ABNORMAL LOW (ref >60)
GFR calc non Af Amer: 44 mL/min — ABNORMAL LOW (ref >60)
Glucose, Bld: 106 mg/dL — ABNORMAL HIGH (ref 70–99)
Glucose, Bld: 118 mg/dL — ABNORMAL HIGH (ref 70–99)
Glucose, Bld: 125 mg/dL — ABNORMAL HIGH (ref 70–99)
Potassium: 3.9 mEq/L (ref 3.5–5.1)
Potassium: 3.9 mEq/L (ref 3.5–5.1)
Potassium: 4.1 mEq/L (ref 3.5–5.1)
Sodium: 135 mEq/L (ref 135–145)
Sodium: 138 mEq/L (ref 135–145)
Sodium: 140 mEq/L (ref 135–145)

## 2010-10-24 LAB — URINALYSIS, MICROSCOPIC ONLY
Bilirubin Urine: NEGATIVE
Glucose, UA: 100 mg/dL — AB
Ketones, ur: NEGATIVE mg/dL
Nitrite: NEGATIVE
Protein, ur: NEGATIVE mg/dL
Specific Gravity, Urine: 1.013 (ref 1.005–1.030)
Urobilinogen, UA: 1 mg/dL (ref 0.0–1.0)
pH: 6 (ref 5.0–8.0)

## 2010-10-24 LAB — URINALYSIS, ROUTINE W REFLEX MICROSCOPIC
Bilirubin Urine: NEGATIVE
Glucose, UA: NEGATIVE mg/dL
Hgb urine dipstick: NEGATIVE
Ketones, ur: NEGATIVE mg/dL
Nitrite: NEGATIVE
Protein, ur: NEGATIVE mg/dL
Specific Gravity, Urine: 1.016 (ref 1.005–1.030)
Urobilinogen, UA: 1 mg/dL (ref 0.0–1.0)
pH: 6 (ref 5.0–8.0)

## 2010-10-24 LAB — TYPE AND SCREEN
ABO/RH(D): O NEG
Antibody Screen: NEGATIVE

## 2010-10-24 LAB — ABO/RH: ABO/RH(D): O NEG

## 2010-10-24 LAB — APTT: aPTT: 30 seconds (ref 24–37)

## 2010-10-24 LAB — PROTIME-INR
INR: 0.96 (ref 0.00–1.49)
INR: 1.16 (ref 0.00–1.49)
INR: 1.76 — ABNORMAL HIGH (ref 0.00–1.49)
INR: 1.76 — ABNORMAL HIGH (ref 0.00–1.49)
INR: 1.94 — ABNORMAL HIGH (ref 0.00–1.49)
Prothrombin Time: 13 seconds (ref 11.6–15.2)
Prothrombin Time: 15 seconds (ref 11.6–15.2)
Prothrombin Time: 20.7 seconds — ABNORMAL HIGH (ref 11.6–15.2)
Prothrombin Time: 20.7 seconds — ABNORMAL HIGH (ref 11.6–15.2)
Prothrombin Time: 22.3 seconds — ABNORMAL HIGH (ref 11.6–15.2)

## 2010-10-24 LAB — SURGICAL PCR SCREEN
MRSA, PCR: NEGATIVE
Staphylococcus aureus: NEGATIVE

## 2010-10-24 LAB — URINE CULTURE
Colony Count: NO GROWTH
Culture  Setup Time: 201109291250
Culture: NO GROWTH

## 2010-10-24 LAB — GLUCOSE, CAPILLARY: Glucose-Capillary: 142 mg/dL — ABNORMAL HIGH (ref 70–99)

## 2010-10-25 LAB — URINALYSIS, ROUTINE W REFLEX MICROSCOPIC
Bilirubin Urine: NEGATIVE
Glucose, UA: NEGATIVE mg/dL
Hgb urine dipstick: NEGATIVE
Ketones, ur: NEGATIVE mg/dL
Nitrite: NEGATIVE
Protein, ur: NEGATIVE mg/dL
Specific Gravity, Urine: 1.021 (ref 1.005–1.030)
Urobilinogen, UA: 0.2 mg/dL (ref 0.0–1.0)
pH: 5.5 (ref 5.0–8.0)

## 2010-10-25 LAB — CBC
HCT: 42.7 % (ref 36.0–46.0)
Hemoglobin: 14.8 g/dL (ref 12.0–15.0)
MCH: 33.3 pg (ref 26.0–34.0)
MCHC: 34.5 g/dL (ref 30.0–36.0)
MCV: 96.6 fL (ref 78.0–100.0)
Platelets: 189 10*3/uL (ref 150–400)
RBC: 4.43 MIL/uL (ref 3.87–5.11)
RDW: 13.9 % (ref 11.5–15.5)
WBC: 8 10*3/uL (ref 4.0–10.5)

## 2010-10-25 LAB — COMPREHENSIVE METABOLIC PANEL
ALT: 24 U/L (ref 0–35)
AST: 31 U/L (ref 0–37)
Albumin: 3.8 g/dL (ref 3.5–5.2)
Alkaline Phosphatase: 51 U/L (ref 39–117)
BUN: 20 mg/dL (ref 6–23)
CO2: 25 mEq/L (ref 19–32)
Calcium: 9.9 mg/dL (ref 8.4–10.5)
Chloride: 110 mEq/L (ref 96–112)
Creatinine, Ser: 1.36 mg/dL — ABNORMAL HIGH (ref 0.4–1.2)
GFR calc Af Amer: 47 mL/min — ABNORMAL LOW (ref >60)
GFR calc non Af Amer: 39 mL/min — ABNORMAL LOW (ref >60)
Glucose, Bld: 96 mg/dL (ref 70–99)
Potassium: 4 mEq/L (ref 3.5–5.1)
Sodium: 142 mEq/L (ref 135–145)
Total Bilirubin: 1.2 mg/dL (ref 0.3–1.2)
Total Protein: 7.5 g/dL (ref 6.0–8.3)

## 2010-10-25 LAB — APTT: aPTT: 34 seconds (ref 24–37)

## 2010-10-25 LAB — PROTIME-INR
INR: 1.07 (ref 0.00–1.49)
Prothrombin Time: 14.1 seconds (ref 11.6–15.2)

## 2010-10-25 LAB — SURGICAL PCR SCREEN
MRSA, PCR: NEGATIVE
Staphylococcus aureus: NEGATIVE

## 2010-12-24 NOTE — Assessment & Plan Note (Signed)
[facility] OFFICE NOTE   LYNA, ODEA                          MRN:          [id number]  DATE:08/16/2008                            DOB:          [birth year]    PRIMARY CARE PHYSICIAN:  [name], Nurse Practitioner.   REASON FOR PRESENTATION:  Evaluate the patient with hypertension and  mitral regurgitation.   HISTORY OF PRESENT ILLNESS:  The patient is 70 years old.  She returns  for her second visit.  She was referred initially for cardiomegaly.  I  did refer her for an echocardiogram.  This demonstrated her EF to be  well preserved.  There was some mild-to-moderate mitral valve  regurgitation.  Her right-sided pressures and pulmonary pressures were  slightly elevated.  There was actually no evidence of left ventricular  hypertrophy or diastolic dysfunction.  The patient had recently been  started on Benicar HCT, switched off an ACE inhibitor secondary to  cough.  She says she is no longer having that cough.  Her blood pressure  is little bit better but still elevated.  She has had some reading still  in the 170s.  She does not take it at home.  She is having no new  shortness of breath.  Denies any PND or orthopnea.  She is not having  any new chest discomfort, neck or arm discomfort.  She does not have any  palpitations, presyncope, or syncope.   PAST MEDICAL HISTORY:  1. Hypertension since 1997.  2. Hyperlipidemia x5 years.  3. Right total knee replacement.  4. Lower lumbar disk surgery.  5. Tubal ligation.   ALLERGIES:  None.   MEDICATIONS:  1. Benicar 40/12.5 daily.  2. Lipitor 40 mg daily.  3. Amlodipine 10 mg daily.  4. Calcium.  5. Fish oil.  6. Metoprolol 50 mg b.i.d.   REVIEW OF SYSTEMS:  As stated in the HPI and otherwise negative for  other systems.   PHYSICAL EXAMINATION:  GENERAL:  The patient is in no distress.  VITAL SIGNS:  Blood pressure 152/78, heart rate 61  and regular, weight  185 pounds, and body mass index 31.  HEENT:  Eyes unremarkable.  Pupils equal, round, and reactive to light.  Fundi not visualized.  Oral mucosa unremarkable.  NECK:  No jugular venous distension at 45 degrees.  Carotid upstroke  brisk and symmetrical.  No bruits.  No thyromegaly.  LYMPHATICS:  No cervical, axillary, or inguinal adenopathy.  LUNGS:  Clear to auscultation bilaterally.  BACK:  No costovertebral angle tenderness.  CHEST:  Unremarkable.  HEART:  PMI not displaced or sustained.  S1 and S2 within normal limits.  No S3, no S4, 2/6 apical systolic murmur radiating slightly to the  axilla, no diastolic murmurs.  ABDOMEN:  Obese, positive bowel sounds.  Normal in frequency and pitch.  No bruits, no rebound, no guarding.  No midline pulsatile mass.  No  hepatomegaly.  No splenomegaly.  SKIN:  No rashes, no nodules.  EXTREMITIES:  2+ pulses throughout.  No edema.  No cyanosis.  No  clubbing.  NEUROLOGIC:  Oriented to person, place, and time.  Cranial nerves II-XII  grossly intact.  Motor grossly intact.   ASSESSMENT AND PLAN:  1. Hypertension.  Blood pressure is still not well controlled.  I am      going to increase her to Benicar 40/25 daily.  I have told her that      she clearly has an ACE-induced cough and she would list this as an      intolerance.  I have also asked her to lose weight as this will      help with her blood pressure control.  The next step in my opinion      should be the addition of spironolactone if she is not at target      blood pressures.  2. Dyslipidemia per [name].  I would suggest a goal LDL      less than 100 and HDL greater than 50, which is an aggressive goal.  3. Obesity.  She understands the need to lose weight with diet and      exercise.  4. Mitral regurgitation.  This was found on echo and I do hear a      slight murmur.  We will follow this up with a repeat echo in 1 year      or symptomatically.  5.  Followup.  I will see her again in a couple of months or sooner if      needed.     [name], MD, [facility]  Electronically Signed    JH/MedQ  DD: 08/16/2008  DT: 08/17/2008  Job #: [id number]   cc:   [name], NP

## 2010-12-24 NOTE — Op Note (Signed)
Brooke Chavez, Brooke Chavez                 ACCOUNT NO.:  [id number]   MEDICAL RECORD NO.:  [id number]          PATIENT TYPE:  INP   LOCATION:  3018                         FACILITY:  [facility]   PHYSICIAN:  Otilio Connors, M.D.  DATE OF BIRTH:  24-May-1941   DATE OF PROCEDURE:  04/20/2007  DATE OF DISCHARGE:                               OPERATIVE REPORT   PREOPERATIVE DIAGNOSES:  1. Lumbar stenosis.  2. Spondylosis.  3. Herniated nucleus pulposus with left radiculopathy.   POSTOPERATIVE DIAGNOSES:  1. Lumbar stenosis.  2. Spondylosis.  3. Herniated nucleus pulposus with left radiculopathy.   PROCEDURES:  1. Decompressive laminectomy of L4-5 with diskectomy, all on the left.  2. Microdissection with microscope.   ASSISTANT:  Dr. Arnoldo Morale   ANESTHESIA:  General endotracheal tube anesthesia.   ESTIMATED BLOOD LOSS:  Minimal.   BLOOD GIVEN:  None.   DRAINS:  None.   COMPLICATIONS:  None.   REASON FOR PROCEDURE:  The patient is a 70 year old woman who has had  back, left hip and left leg pain and tingling.  MRI was done showing  spondylotic change, worse at 4-5 with stenosis, lateral recess stenosis  and HNP, worse to the left.  The patient was brought in for  decompression.   PROCEDURE IN DETAIL:  The patient was brought to the operating room.  General anesthesia was induced.  The patient was placed in the prone  position in a Wilson frame with all pressure points padded.  The patient  was prepped and draped in a sterile fashion.  The incision was injected  with 10 mL of 1% lidocaine with epinephrine.  The needle was placed in  the interspace.  X-rays were obtained showing this was the point of 3-4  interspace.  An incision was then made centered below the level where  the needle was.  The incision was taken down to the fascia.  Hemostasis  was obtained with Bovie cauterization.  The fascia was incised on the  left side and subperiosteal dissection was done over the spinous  process  lamina out to the facet.  A self-retaining retractor was placed and a  marker was placed in the interspace.  Another x-ray was obtained showing  position at the 4-5 level.   The microscope was brought in for microdissection.  A high-speed drill  was used to start decompressive laminectomy and then medial facetectomy  of the inferior part of L4 to L5 and medial facets were removed to  decompress the central canal.  Hypertrophic ligamentum flavum was also  removed and a foraminotomy done over the L5 root.  Then, we started  exploring at the epidural space and there was thick ligament flavum out  laterally and this also was removed with Kerrison punches and when we  were finished, we had good central decompression.   We continued to explore the epidural space and a large __________  disk  herniation and then medially free fragment of disk were found.  We  started removing free fragment of disk.  We incised the disk space and  we then performed  diskectomy using pituitary rongeurs and curettes.  An  osteophyte tool were used to remove the osteophytes from the superior  and inferior endplates.  When we were finished, we had good  decompression of the central canal and decompression at both the 4 and 5  roots.   Hemostasis was obtained with bipolar cauterization and Gelfoam and  thrombin Gelfoam was irrigated out __________  solution, we had good  hemostasis and the retractors were removed.  The fascia was closed with  0 Vicryl interrupted sutures.  The subcutaneous tissue was closed with  both 2-0 and 3-0 Vicryl interrupted sutures.  The skin was closed with  benzoin and Steri-Strips.  Dressing was placed.   The patient was placed back into supine position, awoken from anesthesia  and transferred to the recovery room in stable condition.           ______________________________  Otilio Connors, M.D.     JRH/MEDQ  D:  04/20/2007  T:  04/21/2007  Job:  [id number]

## 2010-12-24 NOTE — Assessment & Plan Note (Signed)
Brooke Chavez OFFICE NOTE   Brooke Chavez, Brooke Chavez                          MRN:          [id number]  DATE:06/14/2008                            DOB:          [birth year]    PRIMARY CARE PHYSICIAN:  [name], MD   REASON FOR PRESENTATION:  Evaluate the patient with cardiomegaly.   HISTORY OF PRESENT ILLNESS:  The patient is a lovely 70 year old African  American female, who has had a cough for about 6 months.  It was a dry,  hacking, nonproductive cough.  It would bother her periodically  throughout the day.  As part of the workup, she did have a chest x-ray.  This demonstrated some vascular congestion with cardiomegaly.  She  subsequently had her benazepril discontinued.  She has been switched to  Benicar HCT.  Her cough she actually thinks is much improved.  Her blood  pressure has been a little elevated since that switch, however.  It is  being followed at [facility].   The patient has not had any prior cardiac workup.  She has had a  longstanding hypertension, however.  She has some hyperlipidemia as  well.  She does not exercise routinely being limited by knee pain.  However, she does push a vacuum cleaner and do some other chores of  daily living.  With this, she does not get any shortness of breath, PND,  or orthopnea.  She does not have any palpitation, presyncope, or  syncope.  She has never had chest pressure, neck or arm discomfort.   PAST MEDICAL HISTORY:  Hypertension since 1977, hyperlipidemia x5 years.   PAST SURGICAL HISTORY:  Right total knee replacement, lower lumbar disk  surgery, tubal ligation.   ALLERGIES:  None.   MEDICATIONS:  1. Benicar HCT 40/12.5 daily.  2. Lipitor 40 mg daily.  3. Amlodipine 10 mg daily.  4. Calcium.  5. Fish oil.   SOCIAL HISTORY:  The patient is retired.  She is a widow.  She has 3  children.  She does not smoke cigarettes or drink alcohol.   FAMILY HISTORY:  Contributory for father dying of myocardial infarction  at 72.  Her brother died in his early 70s of myocardial infarction.  Her  mother died of complications of hypertension at age 55.   REVIEW OF SYSTEMS:  As stated in the HPI and positive for diarrhea.  Otherwise, negative for all other systems.   PHYSICAL EXAMINATION:  GENERAL:  The patient is pleasant and in no  distress.  VITAL SIGNS:  Blood pressure 170/80, heart rate 53 and regular, weight  181 pounds, body mass index 31.  HEENT:  Eyes are unremarkable.  Pupils, equal, round, and reactive to  light.  Fundi within normal limits.  Oral mucosa unremarkable.  NECK:  No jugular venous distention to 45 degrees.  Carotid upstroke  brisk and symmetrical.  No bruits, no thyromegaly.  LYMPHATICS:  No cervical, axillary, or inguinal adenopathy.  LUNGS:  Clear to auscultation bilaterally.  BACK:  No costovertebral angle tenderness.  CHEST:  Unremarkable.  HEART:  PMI is displaced laterally or mildly sustained.  S1 and S2  within normal limits.  No S3, no S4, no murmurs.  ABDOMEN:  Flat, positive bowel sounds, normal in frequency and pitch, no  bruits.  No midline pulsatile mass, no hepatomegaly, no splenomegaly.  SKIN:  No rashes, no nodules.  EXTREMITIES:  Pulses 2+ throughout.  No edema, no cyanosis, no clubbing.  NEURO:  Oriented to person, place, and time.  Cranial nerves II through  XII are grossly intact.  Motor grossly intact throughout.   EKG bradycardia, possible ectopic atrial origin, rate 53, left ventricle  hypertrophy by voltage criteria, left axis deviation, borderline  interventricular conduction delay.   ASSESSMENT AND PLAN:  1. Cardiomegaly.  The patient had cardiomegaly on a chest x-ray.  PMI      suggests this as well, though I do not hear any S4.  I would      suspect that she probably does have some left ventricular      hypertrophy and cardiomegaly based on the EKG in the physical.      This is  most likely related to hypertension.  I am going to order      an echocardiogram to further quantify this.  The treatment, as she      is asymptomatic, will continue to be management of her      hypertension.  2. Hypertension.  Blood pressure is elevated today.  She was just made      this switch to Benicar HCT.  This may need to be increased or      another medicine added.  We will follow this when I see her back      and also she will be followed at [facility].  3. Dyslipidemia, per her primary care physicians.  4. Osteoporosis, again per primary care physicians.  5. Weight.  She does have a body mass index that puts her in the obese      range.  She and I will discuss the need for weight loss as well as      other therapeutic lifestyle changes (TLC).      This will help her blood pressure as well.  6. Followup.  I would like to see her back in about a month after she      has had her echo.     [name], MD, [facility]  Electronically Signed    JH/MedQ  DD: 06/14/2008  DT: 06/15/2008  Job #: [id number]   cc:   [name], MD

## 2010-12-27 NOTE — Discharge Summary (Signed)
Brooke Chavez, Brooke Chavez                 ACCOUNT NO.:  [id number]   MEDICAL RECORD NO.:  [id number]          PATIENT TYPE:  INP   LOCATION:  [id number]                         FACILITY:  [facility]   PHYSICIAN:  Gaynelle Arabian, M.D.    DATE OF BIRTH:  [birth year]   DATE OF ADMISSION:  06/10/2006  DATE OF DISCHARGE:  06/13/2006                               DISCHARGE SUMMARY   ADMISSION DIAGNOSES:  1. Bilateral knee osteoarthritis, right more symptomatic than left.  2. Hypertension.  3. Hypercholesterolemia.   DISCHARGE DIAGNOSES:  1. Osteoarthritis right knee, status post right total knee      arthroplasty.  2. Acute blood loss anemia, did not require transfusion.  3. Osteoarthritis left knee.  4. Hypertension.  5. Hypercholesterolemia.   PROCEDURE:  June 10, 2006, right total knee.  Surgeon was Gaynelle Arabian, M.D. and assistant was Alexzandrew L. Dara Lords, P.A.  Anesthesia  was spinal, tourniquet time 45 minutes.   CONSULTATIONS:  None.   BRIEF HISTORY:  Ms. Bumpas is a 70 year old with end-stage arthritis of  both knees, right more symptomatic than left.  She has failed  nonoperative management and now presents for right total knee.   LABORATORY DATA:  She had a preoperative CBC that showed hemoglobin of  15.2, hematocrit 43.8, white blood cell count 5.8.  Postoperative  hemoglobin 12.1.  Last known H&H 10.4 and 30.4.  PT/PTT preoperative  12.9 and 31 respectively, INR 1.0.  Serial pro times follow; PT/INR 17.6  and 1.4.  Chem study on admission all within normal limits.  Serial  BMET's were followed.  Sodium did drop from 138 to 134, last noted at  132.  Remaining electrolytes remained within normal limits.  Glucose did  go up to 96, to 155, back down to 102.  Lipid profile taken on June 05, 2006; cholesterol elevated at 295, triglycerides normal at 95, HDL  cholesterol 64, VLDL 19, LDL cholesterol high at 212, total  cholesterol/HDL ratio of 4.6.  Vitamin B level at 33.   Urinalysis  preoperative negative.  Blood group type O negative.   Chest x-ray dated February 16, 2006; no active disease.   EKG May 16, 2006; sinus bradycardia, possible left atrial  enlargement, left axis deviation, left ventricular hypertrophy, no old  tracings, confirmed by Bryson Dames, M.D.   HOSPITAL COURSE:  The patient was admitted to [facility] and tolerated the procedure well and later transferred to  recovery room and orthopedic floor.  She was started on PCA and p.o.  analgesics for pain control following surgery and given 24 hours of  postoperative IV antibiotics.  Started on Coumadin for DVT prophylaxis.  Had a rough night following surgery, but was able to sleep a little bit.  For the pain, encouraged the PCA supplemented by the p.o. medicines.  Did have some drainage onto the dressing.  Dressing was changed and  incision looked good.  By day #2, she was feeling better.  Pain was  under better control.  I discontinued the PCA and the fluid.  Incision  looked excellent.  She was running some low grade temperature,  encouraged incentive spirometer and antipyretics.  Sodium was a little  lower following surgery.  She started getting up with physical therapy a  little bit more on day #2, walked about 100 feet and then 200 feet later  that day.  She did progress well with PT and therefore she was ready to  go home by the following day of June 13, 2006.  Temperature was down  a little bit to 100.7.  Incision was clean and dry, no signs of  infection, and was ready to go home.   DISCHARGE PLAN:  The patient was discharged home on June 13, 2006.   DISCHARGE DIAGNOSES:  Please see above.   DISCHARGE MEDICATIONS:  Coumadin, Percocet, and Robaxin.   DIET:  As tolerated.   FOLLOWUP:  In 2 weeks.   DISPOSITION:  Home.   CONDITION ON DISCHARGE:  Improved.      Alexzandrew L. Dara Lords, P.A.      Gaynelle Arabian, M.D.  Electronically  Signed    ALP/MEDQ  D:  07/09/2006  T:  07/10/2006  Job:  [id number]   cc:   Chipper Herb, M.D.  Fax: [phone]

## 2010-12-27 NOTE — Op Note (Signed)
NAMEJALAYNA, SHAGENA                 ACCOUNT NO.:  [id number]   MEDICAL RECORD NO.:  [id number]          PATIENT TYPE:  INP   LOCATION:  57                         FACILITY:  [facility]   PHYSICIAN:  Gaynelle Arabian, M.D.    DATE OF BIRTH:  1941/03/10   DATE OF PROCEDURE:  06/10/2006  DATE OF DISCHARGE:                                 OPERATIVE REPORT   PREOPERATIVE DIAGNOSIS:  Osteoarthritis, right knee.   POSTOPERATIVE DIAGNOSIS:  Osteoarthritis, right knee.   PROCEDURE:  Right total knee arthroplasty.   SURGEON:  Aluisio.   ASSISTANT:  Arlee Muslim.   ANESTHESIA:  Spinal.   ESTIMATED BLOOD LOSS:  Minimal.   DRAINS:  Hemovac x1.   TOURNIQUET TIME:  45 minutes at 300 mmHg.   COMPLICATIONS:  None.   CONDITION:  Stable to recovery.   CLINICAL NOTE:  Ms. Hindes is a 70 year old female with end-stage  osteoarthritis of both knees, right more symptomatic than left.  She has  failed nonoperative management and presents for total knee arthroplasty.   PROCEDURE IN DETAIL:  After successful initiation of spinal anesthetic, a  tourniquet was placed high on her right thigh, and right lower extremity  prepped and draped in the usual sterile fashion.  Extremity was wrapped in  Esmarch, knee flexed, tourniquet inflated to 300 mmHg.  Midline incision was  made with a 10 blade through subcutaneous tissue to the level of the  extensor mechanism.  Fresh blade was used to make a medial parapatellar  arthrotomy.  Soft tissue over the proximal medial tibia is subperiosteally  elevated to the joint line with the knife and into the semimembranosus bursa  with a Cobb elevator.  Soft tissue laterally is elevated with attention  being paid to avoiding the patellar tendon on the tibial tubercle.  Patella  subluxed laterally, knee flexed 90 degrees and ACL and PCL removed.  Drill  was used to create a starting hole in the distal femur.  Canal was  thoroughly irrigated.  A 5-degree right valgus alignment  guide was placed  and referencing off the posterior condyles rotations marked and the blocked  pinned removed 11 mm off of the distal femur.  I removed 11 mm because of  her flexion contracture.  Sizing block was placed, and the size 3 was most  appropriate.  Rotation was marked off of the epicondylar axis.  Size 3  cutting blocks placed, and then anterior, posterior and chamfer cuts were  made.   Tibia subluxed forward and the menisci were removed.  Extramedullary tibial  alignment guide was placed referencing proximally at the medial aspect of  the tibial tubercle and distally along the second metatarsal axis and tibial  crest.  Blocks pinned to remove 10 mm of the nondeficient lateral side.  Tibial resection was made with an oscillating saw.  Size 3 was the most  appropriate tibial component, and the proximal tibia was prepared the  modular drill and keel punch for a size 3.  Femoral preparation was  completed the intercondylar cut.   Size 3 mobile bearing tibial trial, size  3 posterior stabilized femoral  trial and a 10-mm posterior stabilized rotating platform insert trial are  placed.  With a 10, she had a tiny bit of hyperextension.  Went to a 12.5  which allowed for full extension with excellent varus and valgus balance  throughout full range of motion.  Patella was then everted and thickness  measured to be 25 mm.  Freehand resection was taken about 14 mm, 38 template  was placed, lug holes were drilled, trial patella was placed and it tracked  normally.  Osteophytes were removed off the posterior femur with the trial  in place.  All trials were removed, and the cut bone surfaces prepared with  pulsatile lavage.  Cement mixed, and once ready for implantation, the size 3  mobile bearing tibial tray, size 3 posterior stabilized femur and 38 patella  were cemented into place.  The patella was held with a clamp.  Trial 12.5-mm  insert was placed, knee held in full extension, and all  extruded cement  removed.  Once the cement was fully hardened, then the permanent 12.5-mm  posterior stabilized rotating platform insert was placed into the tibial  tray.  The wounds were copiously irrigated with saline solution, and the  extensor mechanism closed over a Hemovac drain with interrupted #1 PDS.  Flexion against gravity ws about 135 degrees.  Tourniquet was released a  total time of 45 minutes.  Subcu was closed with interrupted 2-0 Vicryl and  subcuticular running 4-0 Monocryl.  The Hemovac was hooked to suction.  Incision cleaned and dried, and Steri-Strips and a bulky sterile dressing  applied.  She was placed into a knee immobilizer, awakened, and transferred  to recovery in stable condition.      Gaynelle Arabian, M.D.  Electronically Signed     FA/MEDQ  D:  06/10/2006  T:  06/10/2006  Job:  [id number]

## 2010-12-27 NOTE — H&P (Signed)
NAMEJOHAN, Brooke Chavez                 ACCOUNT NO.:  [id number]   MEDICAL RECORD NO.:  [id number]          PATIENT TYPE:  INP   LOCATION:  [id number]                         FACILITY:  [facility]   PHYSICIAN:  Gaynelle Arabian, M.D.    DATE OF BIRTH:  01-29-1941   DATE OF ADMISSION:  06/10/2006  DATE OF DISCHARGE:                                HISTORY & PHYSICAL   Date of office visit history and physical June 02, 2006.   CHIEF COMPLAINT:  Bilateral knee pain.   HISTORY OF PRESENT ILLNESS:  The patient is a 70 year old female who has  been seen by Dr. Wynelle Link for ongoing bilateral knee pain.  She had problems  in the right knee for many years now and most recently developed ongoing  problems in the left knee.  Her pain has been progressive in nature.  She  has severe arthritic changes at both knees with bone-on-bone knee and  patellofemoral compartments on the right and a little lesser to an extent on  the left.  She has end-stage arthritis in both knees.  It is felt she has  reached a point where she can benefit from undergoing knee replacement.  Risks and benefits have been discussed and she elects to proceed with  surgery.   ALLERGIES:  No known drug allergies.   INTOLERANCES:  Aspirin causes GI upset and Lipitor causes leg cramps.   CURRENT MEDICATIONS:  1. Mobic 15 mg daily.  2. Benazepril HCl 20 mg daily.  3. Amlodipine 10 mg daily.  4. Fish oil 4 times a day.  5. Garlic supplement daily.  6. Citracal daily.   PAST MEDICAL HISTORY:  1. Hypertension.  2. Hypercholesterolemia.  3. Arthritis.   PAST SURGICAL HISTORY:  1. Right knee arthroscopy.  2. Tubal ligation.   FAMILY HISTORY:  Father deceased at age 69 cause is unknown.  The patient's  father died when she was young.  Mother deceased at age 45 with a history of  hypertension.   SOCIAL HISTORY:  Her daughter lives with her.  The patient denies any  tobacco products or alcohol products.   REVIEW OF SYSTEMS:  GENERAL:   No fevers, chills, night sweats.  NEURO:  No  seizures, syncope, or paralysis.  RESPIRATORY:  No shortness of breath,  productive cough, or hemoptysis.  She does have a little bit of a dry throat  which she uses throat lozenges for.  CARDIOVASCULAR:  No chest pain, angina,  orthopnea.  GI:  No nausea, vomiting, diarrhea, or constipation.  GU:  No  dysuria, hematuria, or discharge.  MUSCULOSKELETAL:  Both knees.   PHYSICAL EXAMINATION:  VITAL SIGNS:  Pulse 72.  Respiration 12.  Blood  pressure 178/80.  GENERAL:  A 70 year old, African-American female, well-nourished, well-  developed in no acute distress.  She is alert and cooperative, pleasant,  good historian.  HEENT:  Normocephalic, atraumatic.  Pupils are round and reactive.  Oropharynx is clear.  EOMs intact.  She does have partial upper and lower  denture plates.  NECK:  Supple. No bruits.  CHEST:  Clear.  Anterior  and posterior chest wall is normal.  No rhonchi,  rales, or wheezing.  HEART:  Regular rate and rhythm.  No murmur.  S1, S2 noted.  ABDOMEN:  Soft, nontender.  Bowel sounds present.  BREASTS/RECTAL/GENITALIA:  Not done. Not pertinent to present illness.  EXTREMITIES:  The right knee shows a moderate varus malalignment deformity.  No effusion.  Range of motion 10 to 110, moderate crepitus is noted.  Left  knee range of motion 0 to 115, moderate crepitus, no effusion.   IMPRESSION:  1. Bilateral knees, osteoarthritis, right more symptomatic than the left.  2. Hypertension.  3. Hypercholesterolemia.   PLAN:  The patient admitted to [facility] to undergo a right total knee  arthroplasty.  Surgery will be performed by Dr. Gaynelle Arabian.      Alexzandrew L. Dara Lords, P.A.      Gaynelle Arabian, M.D.  Electronically Signed    ALP/MEDQ  D:  06/11/2006  T:  06/12/2006  Job:  [id number]   cc:   Chipper Herb, M.D.  Fax: [phone]   Gaynelle Arabian, M.D.  Fax: [phone]

## 2011-03-21 ENCOUNTER — Encounter: Payer: Self-pay | Admitting: Cardiology

## 2011-04-29 ENCOUNTER — Encounter: Payer: Self-pay | Admitting: Cardiology

## 2011-04-30 ENCOUNTER — Encounter: Payer: Self-pay | Admitting: Cardiology

## 2011-04-30 ENCOUNTER — Ambulatory Visit (INDEPENDENT_AMBULATORY_CARE_PROVIDER_SITE_OTHER): Payer: Medicare Other | Admitting: Cardiology

## 2011-04-30 VITALS — BP 108/64 | HR 100 | Resp 18 | Ht 63.0 in | Wt 178.0 lb

## 2011-04-30 DIAGNOSIS — E785 Hyperlipidemia, unspecified: Secondary | ICD-10-CM

## 2011-04-30 DIAGNOSIS — E663 Overweight: Secondary | ICD-10-CM

## 2011-04-30 DIAGNOSIS — I4892 Unspecified atrial flutter: Secondary | ICD-10-CM

## 2011-04-30 DIAGNOSIS — I080 Rheumatic disorders of both mitral and aortic valves: Secondary | ICD-10-CM

## 2011-04-30 MED ORDER — DABIGATRAN ETEXILATE MESYLATE 75 MG PO CAPS: ORAL_CAPSULE | ORAL | Status: DC

## 2011-04-30 MED ORDER — DABIGATRAN ETEXILATE MESYLATE 75 MG PO CAPS: 75.0000 mg | ORAL_CAPSULE | Freq: Two times a day (BID) | ORAL | Status: DC

## 2011-04-30 NOTE — Assessment & Plan Note (Signed)
She had some mild to moderate MR on echo in Nov 11.  I will repeat this echo at future appts in sinus rhythm.

## 2011-04-30 NOTE — Assessment & Plan Note (Signed)
She is back in flutter.  At this point she would consider redo ablation.  I will set her up to see [name].  She will resume Pradaxa and I will check a CBC next week.

## 2011-04-30 NOTE — Assessment & Plan Note (Signed)
The patient understands the need to lose weight with diet and exercise.

## 2011-04-30 NOTE — Progress Notes (Signed)
Addended by: Janan Halter F on: 04/30/2011 07:05 PM   Modules accepted: Orders

## 2011-04-30 NOTE — Assessment & Plan Note (Signed)
Her LDL was 130 in May and her Lipitor was increased.  This is followed closely by her primary team.

## 2011-04-30 NOTE — Progress Notes (Signed)
HPI The patient presents for follow up of recurrent atrial flutter.  She was noted to be in this yesterday.  She is s/p ablation last year.  She actually feels quite well. She does not notice any palpitations. She does not have any of the pounding she had before.  The patient denies any new symptoms such as chest discomfort, neck or arm discomfort. There has been no new shortness of breath, PND or orthopnea. There have been no reported, presyncope or syncope.  Of note she has had no evidence of GI bleeding or other contraindication to anticoagulation.  No Known Allergies  Current Outpatient Prescriptions  Medication Sig Dispense Refill  . amLODipine (NORVASC) 10 MG tablet Take 5 mg by mouth daily.        Marland Kitchen atorvastatin (LIPITOR) 40 MG tablet Take 40 mg by mouth daily.        . cholecalciferol (VITAMIN D) 1000 UNITS tablet Take 1,000 Units by mouth daily.        . fish oil-omega-3 fatty acids 1000 MG capsule Take 3 capsules by mouth daily.        Marland Kitchen olmesartan-hydrochlorothiazide (BENICAR HCT) 40-25 MG per tablet Take 1 tablet by mouth daily.        Marland Kitchen spironolactone (ALDACTONE) 25 MG tablet Take 25 mg by mouth daily.          Past Medical History  Diagnosis Date  . Hypertension     Since 1997  . Hyperlipidemia     x5 years  . Mitral regurgitation     Mild to moderate  . Bradycardia   . 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 Atrial flutter     Typical by EKG diagnosis 9/11 s/p CIT ablation 11/11  . DJD (degenerative joint disease)     Past Surgical History  Procedure Date  . Knee arthroscopy     Right  . Tubal ligation   . Lumbar spine surgery   . Total knee arthroplasty     Left  . Atrial ablation surgery 06/2010    ROS:  As stated in the HPI and negative for all other systems.  PHYSICAL EXAM BP 108/64  Pulse 100  Resp 18  Ht 5\' 3"  (1.6 m)  Wt 178 lb (80.74 kg)  BMI 31.53 kg/m2 GENERAL:  Well appearing HEENT:  Pupils equal round and reactive,  fundi not visualized, oral mucosa unremarkable, poor dentition NECK:  No jugular venous distention, waveform within normal limits, carotid upstroke brisk and symmetric, no bruits, no thyromegaly LYMPHATICS:  No cervical, inguinal adenopathy LUNGS:  Clear to auscultation bilaterally BACK:  No CVA tenderness CHEST:  Unremarkable HEART:  PMI not displaced or sustained,S1 and S2 within normal limits, no S3, no S4, no clicks, no rubs, no murmurs, irregular ABD:  Flat, positive bowel sounds normal in frequency in pitch, no bruits, no rebound, no guarding, no midline pulsatile mass, no hepatomegaly, no splenomegaly EXT:  2 plus pulses throughout, no edema, no cyanosis no clubbing SKIN:  No rashes no nodules NEURO:  Cranial nerves II through XII grossly intact, motor grossly intact throughout PSYCH:  Cognitively intact, oriented to person place and time  EKG:  Atrial flutter with variable conduction. LAD.  ASSESSMENT AND PLAN

## 2011-04-30 NOTE — Patient Instructions (Addendum)
Please start Pradaxa 75 mg twice a day Continue all other medications the same  Please have a CBC (lab work) in 2 weeks  Please see Dr Rayann Heman for repeat flutter ablation

## 2011-05-05 NOTE — Progress Notes (Signed)
Addended by: Shellia Cleverly on: 05/05/2011 04:09 PM   Modules accepted: Orders

## 2011-05-08 ENCOUNTER — Telehealth: Payer: Self-pay | Admitting: Cardiology

## 2011-05-08 ENCOUNTER — Telehealth: Payer: Self-pay | Admitting: *Deleted

## 2011-05-08 NOTE — Telephone Encounter (Signed)
Paperwork received and is being worked on.

## 2011-05-08 NOTE — Telephone Encounter (Signed)
A call was left on may voice mail yesterday when I wasn't in the office. Gwen was calling regarding Prior Authorization for the patient's Pradaxa. Will forward to Dr. Rosezella [place] nurse.

## 2011-05-08 NOTE — Telephone Encounter (Signed)
Brooke Chavez has samples for the pt.  They will leave them at the front desk for her to pick up.  Pt is aware.

## 2011-05-08 NOTE — Telephone Encounter (Signed)
I attempted to call [employer]. They were asking for a diagnosis code for the patient's Pradaxa. I explained it would be for atrial flutter. Per Holland Falling, a diagnosis of "heart flutter" will not be accepted for Pradaxa. The person I spoke with said that a letter was faxed to Dr. Percival Spanish regarding this issue. I will forward the message to [facility] to see if she knows about this.

## 2011-05-08 NOTE — Telephone Encounter (Signed)
The med is pradaxa

## 2011-05-08 NOTE — Telephone Encounter (Signed)
Pt called. She said that insurance company not covering. Pt wants to know if she can have samples.

## 2011-05-15 ENCOUNTER — Encounter: Payer: Self-pay | Admitting: Internal Medicine

## 2011-05-19 ENCOUNTER — Ambulatory Visit: Payer: Self-pay | Admitting: Internal Medicine

## 2011-05-23 LAB — COMPREHENSIVE METABOLIC PANEL
ALT: 70 — ABNORMAL HIGH
AST: 31
Albumin: 3.3 — ABNORMAL LOW
Alkaline Phosphatase: 62
BUN: 14
CO2: 29
Calcium: 9.4
Chloride: 109
Creatinine, Ser: 1.02
GFR calc Af Amer: >60
GFR calc non Af Amer: 54 — ABNORMAL LOW
Glucose, Bld: 67 — ABNORMAL LOW
Potassium: 4
Sodium: 146 — ABNORMAL HIGH
Total Bilirubin: 0.5
Total Protein: 6.2

## 2011-05-23 LAB — URINALYSIS, ROUTINE W REFLEX MICROSCOPIC
Glucose, UA: NEGATIVE
Hgb urine dipstick: NEGATIVE
Ketones, ur: NEGATIVE
Nitrite: NEGATIVE
Protein, ur: NEGATIVE
Specific Gravity, Urine: 1.02
Urobilinogen, UA: 1
pH: 6.5

## 2011-05-23 LAB — CBC
HCT: 44.3
Hemoglobin: 14.9
MCHC: 33.6
MCV: 94.2
Platelets: 259
RBC: 4.71
RDW: 15.4 — ABNORMAL HIGH
WBC: 11.5 — ABNORMAL HIGH

## 2011-05-23 LAB — APTT: aPTT: 27

## 2011-05-23 LAB — PROTIME-INR
INR: 0.9
Prothrombin Time: 12.3

## 2011-05-28 ENCOUNTER — Ambulatory Visit (INDEPENDENT_AMBULATORY_CARE_PROVIDER_SITE_OTHER): Payer: Medicare Other | Admitting: Internal Medicine

## 2011-05-28 ENCOUNTER — Encounter: Payer: Self-pay | Admitting: Internal Medicine

## 2011-05-28 ENCOUNTER — Encounter: Payer: Self-pay | Admitting: *Deleted

## 2011-05-28 DIAGNOSIS — I10 Essential (primary) hypertension: Secondary | ICD-10-CM

## 2011-05-28 DIAGNOSIS — E785 Hyperlipidemia, unspecified: Secondary | ICD-10-CM

## 2011-05-28 DIAGNOSIS — I4892 Unspecified atrial flutter: Secondary | ICD-10-CM

## 2011-05-28 MED ORDER — DABIGATRAN ETEXILATE MESYLATE 150 MG PO CAPS: ORAL_CAPSULE | ORAL | Status: DC

## 2011-05-28 NOTE — Patient Instructions (Addendum)
Your physician has recommended that you have an ablation. Catheter ablation is a medical procedure used to treat some cardiac arrhythmias (irregular heartbeats). During catheter ablation, a long, thin, flexible tube is put into a blood vessel in your groin (upper thigh), or neck. This tube is called an ablation catheter. It is then guided to your heart through the blood vessel. Radio frequency waves destroy small areas of heart tissue where abnormal heartbeats may cause an arrhythmia to start. Please see the instruction sheet given to you today.  Your physician has recommended you make the following change in your medication:  1) Increase Pradaxa to 150mg  twice daily

## 2011-05-28 NOTE — Progress Notes (Signed)
The patient presents today for routine electrophysiology followup.  Since last being seen in our clinic, the patient reports doing reasonably well.  She recently returned to Dr Hochrein's clinic and was found to have returned to atrial flutter.  She reports occasional palpitations and fatigue.  Today, she denies symptoms of chest pain, shortness of breath, orthopnea, PND, lower extremity edema, dizziness, presyncope, syncope, or neurologic sequela.  The patient feels that she is tolerating medications without difficulties and is otherwise without complaint today.   Past Medical History  Diagnosis Date  . Hypertension     Since 1997  . Hyperlipidemia     x5 years  . Mitral regurgitation     Mild to moderate  . Bradycardia   . 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 Atrial flutter     Typical by EKG diagnosis 9/11 s/p CIT ablation 11/11  . DJD (degenerative joint disease)    Past Surgical History  Procedure Date  . Knee arthroscopy     Right  . Tubal ligation   . Lumbar spine surgery   . Total knee arthroplasty     Left  . Atrial ablation surgery 06/2010    Current Outpatient Prescriptions  Medication Sig Dispense Refill  . amLODipine (NORVASC) 10 MG tablet Take 5 mg by mouth daily.        Marland Kitchen atorvastatin (LIPITOR) 40 MG tablet Take 40 mg by mouth daily.        . cholecalciferol (VITAMIN D) 1000 UNITS tablet Take 1,000 Units by mouth daily.        . dabigatran (PRADAXA) 75 MG CAPS Take one by mouth twice daily  60 capsule  6  . fish oil-omega-3 fatty acids 1000 MG capsule Take 3 capsules by mouth daily.        Marland Kitchen olmesartan-hydrochlorothiazide (BENICAR HCT) 40-25 MG per tablet Take 1 tablet by mouth daily.        Marland Kitchen spironolactone (ALDACTONE) 25 MG tablet Take 25 mg by mouth daily.          Allergies  Allergen Reactions  . Aspirin     History   Social History  . Marital Status: Widowed    Spouse Name: N/A    Number of Children: N/A  . Years of  Education: N/A   Occupational History  . Not on file.   Social History Main Topics  . Smoking status: Never Smoker   . Smokeless tobacco: Not on file  . Alcohol Use: No  . Drug Use: Not on file  . Sexually Active: Not on file   Other Topics Concern  . Not on file   Social History Narrative   Lives in [place] daughter lives with her    Family History  Problem Relation Age of Onset  . Hypertension Mother     ROS-  All systems are reviewed and are negative except as outlined in the HPI above   Physical Exam: Filed Vitals:   05/28/11 1431  BP: 137/80  Pulse: 73  Height: 5\' 3"  (1.6 m)  Weight: 179 lb (81.194 kg)    GEN- The patient is well appearing, alert and oriented x 3 today.   Head- normocephalic, atraumatic Eyes-  Sclera clear, conjunctiva pink Ears- hearing intact Oropharynx- clear Neck- supple, no JVP Lymph- no cervical lymphadenopathy Lungs- Clear to ausculation bilaterally, normal work of breathing Heart- irregular rate and rhythm,  GI- soft, NT, ND, + BS Extremities- no clubbing, cyanosis, or edema  MS- no significant deformity or atrophy Skin- no rash or lesion Psych- euthymic mood, full affect Neuro- strength and sensation are intact  ekg from 04/29/11 reveals typical appearing atrial flutter  Assessment and Plan:

## 2011-05-28 NOTE — Assessment & Plan Note (Signed)
Stable No change required today  

## 2011-05-28 NOTE — Assessment & Plan Note (Signed)
The patient presents today with recurrent typical appearing atrial flutter.  She is minimally symptomatic.  Her CHADSVASC score is at least 3.  She should therefore be anticoagulated.  She is on Pradaxa 75mg  BID today, though her CrCl is 55.  I will therefore increase to pradaxa 150mg  BID.   Therapeutic strategies for atrial flutter including medicine and ablation were discussed in detail with the patient today. Risk, benefits, and alternatives to EP study and radiofrequency ablation for afib were also discussed in detail today. These risks include but are not limited to stroke, bleeding, vascular damage, tamponade, perforation, damage to the heart and other structures,AV block requiring PPM, worsening renal function, and death. The patient understands these risk and wishes to proceed.  We will therefore proceed with catheter ablation in 3 weeks once the patient has been adequately anticoagulated.

## 2011-05-29 ENCOUNTER — Encounter (HOSPITAL_COMMUNITY): Payer: Self-pay

## 2011-05-29 ENCOUNTER — Ambulatory Visit (HOSPITAL_COMMUNITY): Admit: 2011-05-29 | Payer: Self-pay | Admitting: Internal Medicine

## 2011-05-29 SURGERY — ATRIAL FLUTTER ABLATION
Anesthesia: Choice

## 2011-05-30 ENCOUNTER — Other Ambulatory Visit: Payer: Self-pay | Admitting: Internal Medicine

## 2011-05-30 DIAGNOSIS — I4892 Unspecified atrial flutter: Secondary | ICD-10-CM

## 2011-05-30 NOTE — Progress Notes (Signed)
Addended byKarlene Einstein on: 05/30/2011 04:25 PM   Modules accepted: Orders

## 2011-06-20 ENCOUNTER — Other Ambulatory Visit (INDEPENDENT_AMBULATORY_CARE_PROVIDER_SITE_OTHER): Payer: Medicare Other | Admitting: *Deleted

## 2011-06-20 DIAGNOSIS — I4892 Unspecified atrial flutter: Secondary | ICD-10-CM

## 2011-06-20 LAB — CBC WITH DIFFERENTIAL/PLATELET
Basophils Absolute: 0 10*3/uL (ref 0.0–0.1)
Basophils Relative: 0.4 % (ref 0.0–3.0)
Eosinophils Absolute: 0.1 10*3/uL (ref 0.0–0.7)
Eosinophils Relative: 2.2 % (ref 0.0–5.0)
HCT: 36.1 % (ref 36.0–46.0)
Hemoglobin: 12.2 g/dL (ref 12.0–15.0)
Lymphocytes Relative: 34.8 % (ref 12.0–46.0)
Lymphs Abs: 1.7 10*3/uL (ref 0.7–4.0)
MCHC: 33.8 g/dL (ref 30.0–36.0)
MCV: 94.9 fl (ref 78.0–100.0)
Monocytes Absolute: 0.4 10*3/uL (ref 0.1–1.0)
Monocytes Relative: 7.8 % (ref 3.0–12.0)
Neutro Abs: 2.8 10*3/uL (ref 1.4–7.7)
Neutrophils Relative %: 54.8 % (ref 43.0–77.0)
Platelets: 202 10*3/uL (ref 150.0–400.0)
RBC: 3.81 Mil/uL — ABNORMAL LOW (ref 3.87–5.11)
RDW: 13.8 % (ref 11.5–14.6)
WBC: 5 10*3/uL (ref 4.5–10.5)

## 2011-06-20 LAB — BASIC METABOLIC PANEL
BUN: 20 mg/dL (ref 6–23)
CO2: 25 mEq/L (ref 19–32)
Calcium: 9.2 mg/dL (ref 8.4–10.5)
Chloride: 109 mEq/L (ref 96–112)
Creatinine, Ser: 1.6 mg/dL — ABNORMAL HIGH (ref 0.4–1.2)
GFR: 41.79 mL/min — ABNORMAL LOW (ref >60.00)
Glucose, Bld: 88 mg/dL (ref 70–99)
Potassium: 4.2 mEq/L (ref 3.5–5.1)
Sodium: 141 mEq/L (ref 135–145)

## 2011-06-23 ENCOUNTER — Encounter (HOSPITAL_COMMUNITY): Payer: Self-pay | Admitting: Pharmacy Technician

## 2011-06-24 ENCOUNTER — Encounter (HOSPITAL_COMMUNITY): Payer: Self-pay | Admitting: Pharmacy Technician

## 2011-06-24 ENCOUNTER — Telehealth: Payer: Self-pay | Admitting: Internal Medicine

## 2011-06-24 NOTE — Telephone Encounter (Signed)
Pt has some questions about the procedure she is having done in hospital. Please call

## 2011-06-24 NOTE — Telephone Encounter (Signed)
I talked with pt. She had questions about billing for ablation. I referred her to billing [phone].

## 2011-06-24 NOTE — Telephone Encounter (Signed)
Attempted to reach the pt but no answer.

## 2011-06-27 ENCOUNTER — Encounter (HOSPITAL_COMMUNITY): Payer: Self-pay | Admitting: Certified Registered"

## 2011-06-27 ENCOUNTER — Ambulatory Visit (HOSPITAL_COMMUNITY): Payer: Medicare Other | Admitting: Certified Registered"

## 2011-06-27 ENCOUNTER — Encounter (HOSPITAL_COMMUNITY)
Admission: RE | Disposition: A | Discharge status: Home / Self Care | Payer: Self-pay | Source: Ambulatory Visit | Attending: Internal Medicine

## 2011-06-27 ENCOUNTER — Ambulatory Visit (HOSPITAL_COMMUNITY)
Admission: RE | Admit: 2011-06-27 | Discharge: 2011-06-28 | Disposition: A | Discharge status: Home / Self Care | Payer: Medicare Other | Source: Ambulatory Visit | Attending: Internal Medicine | Admitting: Internal Medicine

## 2011-06-27 ENCOUNTER — Encounter (HOSPITAL_COMMUNITY): Payer: Self-pay | Admitting: General Practice

## 2011-06-27 DIAGNOSIS — I10 Essential (primary) hypertension: Secondary | ICD-10-CM | POA: Insufficient documentation

## 2011-06-27 DIAGNOSIS — R001 Bradycardia, unspecified: Secondary | ICD-10-CM | POA: Insufficient documentation

## 2011-06-27 DIAGNOSIS — I495 Sick sinus syndrome: Secondary | ICD-10-CM | POA: Insufficient documentation

## 2011-06-27 DIAGNOSIS — I480 Paroxysmal atrial fibrillation: Secondary | ICD-10-CM | POA: Insufficient documentation

## 2011-06-27 DIAGNOSIS — E785 Hyperlipidemia, unspecified: Secondary | ICD-10-CM | POA: Insufficient documentation

## 2011-06-27 DIAGNOSIS — I079 Rheumatic tricuspid valve disease, unspecified: Secondary | ICD-10-CM | POA: Insufficient documentation

## 2011-06-27 DIAGNOSIS — I4892 Unspecified atrial flutter: Secondary | ICD-10-CM

## 2011-06-27 HISTORY — PX: A FLUTTER ABLATION: SHX5348

## 2011-06-27 LAB — PROTIME-INR
INR: 1.76 — ABNORMAL HIGH (ref 0.00–1.49)
Prothrombin Time: 20.8 seconds — ABNORMAL HIGH (ref 11.6–15.2)

## 2011-06-27 SURGERY — ABLATION, ARRHYTHMOGENIC FOCUS, FOR ATRIAL FLUTTER
Anesthesia: General | Wound class: Clean

## 2011-06-27 MED ORDER — SIMVASTATIN 20 MG PO TABS
20.0000 mg | ORAL_TABLET | Freq: Every day | ORAL | Status: DC
  Administered 2011-06-27: 20 mg via ORAL
  Filled 2011-06-27 (×2): qty 1

## 2011-06-27 MED ORDER — SODIUM CHLORIDE 0.9 % IJ SOLN
3.0000 mL | Freq: Two times a day (BID) | INTRAMUSCULAR | Status: DC
  Administered 2011-06-27 (×2): 3 mL via INTRAVENOUS

## 2011-06-27 MED ORDER — LACTATED RINGERS IV SOLN: INTRAVENOUS | Status: DC | PRN

## 2011-06-27 MED ORDER — HYDROCODONE-ACETAMINOPHEN 5-325 MG PO TABS: 1.0000 | ORAL_TABLET | ORAL | Status: DC | PRN

## 2011-06-27 MED ORDER — ONDANSETRON HCL 4 MG/2ML IJ SOLN: 4.0000 mg | Freq: Once | INTRAMUSCULAR | Status: AC | PRN

## 2011-06-27 MED ORDER — SODIUM CHLORIDE 0.45 % IV SOLN
INTRAVENOUS | Status: DC
  Administered 2011-06-27: 25 mL via INTRAVENOUS
  Administered 2011-06-27: 100 mL via INTRAVENOUS
  Administered 2011-06-27 (×2): 25 mL via INTRAVENOUS
  Administered 2011-06-27: 50 mL via INTRAVENOUS
  Administered 2011-06-27: 25 mL via INTRAVENOUS
  Administered 2011-06-27: 06:00:00 via INTRAVENOUS
  Administered 2011-06-27: 125 mL via INTRAVENOUS
  Administered 2011-06-27: 25 mL via INTRAVENOUS

## 2011-06-27 MED ORDER — PROPOFOL 10 MG/ML IV EMUL
INTRAVENOUS | Status: DC | PRN
  Administered 2011-06-27: 75 ug/kg/min via INTRAVENOUS

## 2011-06-27 MED ORDER — HEPARIN SODIUM (PORCINE) 1000 UNIT/ML IJ SOLN
INTRAMUSCULAR | Status: AC
  Filled 2011-06-27: qty 1

## 2011-06-27 MED ORDER — VITAMIN D3 25 MCG (1000 UNIT) PO TABS
1000.0000 [IU] | ORAL_TABLET | Freq: Every day | ORAL | Status: DC
  Administered 2011-06-27: 1000 [IU] via ORAL
  Filled 2011-06-27 (×2): qty 1

## 2011-06-27 MED ORDER — HYDROMORPHONE HCL PF 1 MG/ML IJ SOLN: 0.2500 mg | INTRAMUSCULAR | Status: DC | PRN

## 2011-06-27 MED ORDER — DABIGATRAN ETEXILATE MESYLATE 150 MG PO CAPS
150.0000 mg | ORAL_CAPSULE | Freq: Two times a day (BID) | ORAL | Status: DC
  Administered 2011-06-27: 150 mg via ORAL
  Filled 2011-06-27 (×4): qty 1

## 2011-06-27 MED ORDER — SODIUM CHLORIDE 0.9 % IJ SOLN: 3.0000 mL | INTRAMUSCULAR | Status: DC | PRN

## 2011-06-27 MED ORDER — ACETAMINOPHEN 325 MG PO TABS
650.0000 mg | ORAL_TABLET | ORAL | Status: DC | PRN
Start: 2011-06-27 — End: 2011-06-28

## 2011-06-27 MED ORDER — AMLODIPINE BESYLATE 5 MG PO TABS
5.0000 mg | ORAL_TABLET | Freq: Every day | ORAL | Status: DC
  Administered 2011-06-27: 5 mg via ORAL
  Filled 2011-06-27 (×2): qty 1

## 2011-06-27 MED ORDER — ONDANSETRON HCL 4 MG/2ML IJ SOLN: 4.0000 mg | Freq: Four times a day (QID) | INTRAMUSCULAR | Status: DC | PRN

## 2011-06-27 MED ORDER — BUPIVACAINE HCL (PF) 0.25 % IJ SOLN
INTRAMUSCULAR | Status: AC
  Filled 2011-06-27: qty 30

## 2011-06-27 MED ORDER — ACETAMINOPHEN 325 MG PO TABS: 325.0000 mg | ORAL_TABLET | Freq: Four times a day (QID) | ORAL | Status: DC | PRN

## 2011-06-27 MED ORDER — FENTANYL CITRATE 0.05 MG/ML IJ SOLN
INTRAMUSCULAR | Status: DC | PRN
  Administered 2011-06-27 (×2): 50 ug via INTRAVENOUS

## 2011-06-27 NOTE — Progress Notes (Signed)
Doing well s/p ablation of atrial flutter (see my op note) She has chronic bradycardia with intermittent junctional rhythm for which she is asymptomatic.  No further workup is planned unless symptoms occur.  Plan is to discharge to home in am. Continue pradaxa 150mg  BID until she sees me in the office in 4 weeks. Would not start AV nodal agents given bradycardia.

## 2011-06-27 NOTE — Transfer of Care (Signed)
Immediate Anesthesia Transfer of Care Note  Patient: Brooke Chavez  Procedure(s) Performed:  ABLATION A FLUTTER  Patient Location: PACU and Cath Lab  Anesthesia Type: MAC  Level of Consciousness: awake, alert  and oriented  Airway & Oxygen Therapy: Patient Spontanous Breathing  Post-op Assessment: Report given to PACU RN and Post -op Vital signs reviewed and stable  Post vital signs: stable  Complications: No apparent anesthesia complications

## 2011-06-27 NOTE — Anesthesia Postprocedure Evaluation (Signed)
  Anesthesia Post-op Note  Patient: Brooke Chavez  Procedure(s) Performed:  ABLATION A FLUTTER  Patient Location: Cath Lab  Anesthesia Type: MAC  Level of Consciousness: awake, alert , oriented and sedated  Airway and Oxygen Therapy: Patient Spontanous Breathing and Patient connected to nasal cannula oxygen  Post-op Pain: none  Post-op Assessment: Post-op Vital signs reviewed, Patient's Cardiovascular Status Stable, Respiratory Function Stable, Patent Airway, No signs of Nausea or vomiting and Pain level controlled  Post-op Vital Signs: stable  Complications: No apparent anesthesia complications

## 2011-06-27 NOTE — Brief Op Note (Signed)
06/27/2011  9:34 AM  PATIENT:  Brooke Chavez  70 y.o. female  PRE-OPERATIVE DIAGNOSIS:  A Flutter  POST-OPERATIVE DIAGNOSIS:  Typical counter clockwise atrial flutter,  Sinus node dysfunction  PROCEDURE:  Procedure(s): ABLATION A FLUTTER  SURGEON:  Surgeon(s): Coralyn Mark, MD  ANESTHESIA:   IV sedation  EBL:   1cc  BLOOD ADMINISTERED:none  DRAINS: none   LOCAL MEDICATIONS USED:  LIDOCAINE 1 CC  SPECIMEN:  None   DISPOSITION OF SPECIMEN:  N/A  COUNTS:  YES  TOURNIQUET:  none  DICTATION: epic  PLAN OF CARE: Admit for overnight observation  PATIENT DISPOSITION:  PACU - hemodynamically stable.   Delay start of Pharmacological VTE agent (>24hrs) due to surgical blood loss or risk of bleeding:  {YES/NO/NOT APPLICABLE:20182

## 2011-06-27 NOTE — Preoperative (Signed)
Beta Blockers   Reason not to administer Beta Blockers:Not Applicable 

## 2011-06-27 NOTE — H&P (View-Only) (Signed)
The patient presents today for routine electrophysiology followup.  Since last being seen in our clinic, the patient reports doing reasonably well.  She recently returned to Dr Hochrein's clinic and was found to have returned to atrial flutter.  She reports occasional palpitations and fatigue.  Today, she denies symptoms of chest pain, shortness of breath, orthopnea, PND, lower extremity edema, dizziness, presyncope, syncope, or neurologic sequela.  The patient feels that she is tolerating medications without difficulties and is otherwise without complaint today.   Past Medical History  Diagnosis Date  . Hypertension     Since 1997  . Hyperlipidemia     x5 years  . Mitral regurgitation     Mild to moderate  . Bradycardia   . 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 Atrial flutter     Typical by EKG diagnosis 9/11 s/p CIT ablation 11/11  . DJD (degenerative joint disease)    Past Surgical History  Procedure Date  . Knee arthroscopy     Right  . Tubal ligation   . Lumbar spine surgery   . Total knee arthroplasty     Left  . Atrial ablation surgery 06/2010    Current Outpatient Prescriptions  Medication Sig Dispense Refill  . amLODipine (NORVASC) 10 MG tablet Take 5 mg by mouth daily.        Marland Kitchen atorvastatin (LIPITOR) 40 MG tablet Take 40 mg by mouth daily.        . cholecalciferol (VITAMIN D) 1000 UNITS tablet Take 1,000 Units by mouth daily.        . dabigatran (PRADAXA) 75 MG CAPS Take one by mouth twice daily  60 capsule  6  . fish oil-omega-3 fatty acids 1000 MG capsule Take 3 capsules by mouth daily.        Marland Kitchen olmesartan-hydrochlorothiazide (BENICAR HCT) 40-25 MG per tablet Take 1 tablet by mouth daily.        Marland Kitchen spironolactone (ALDACTONE) 25 MG tablet Take 25 mg by mouth daily.          Allergies  Allergen Reactions  . Aspirin     History   Social History  . Marital Status: Widowed    Spouse Name: N/A    Number of Children: N/A  . Years of  Education: N/A   Occupational History  . Not on file.   Social History Main Topics  . Smoking status: Never Smoker   . Smokeless tobacco: Not on file  . Alcohol Use: No  . Drug Use: Not on file  . Sexually Active: Not on file   Other Topics Concern  . Not on file   Social History Narrative   Lives in [place] daughter lives with her    Family History  Problem Relation Age of Onset  . Hypertension Mother     ROS-  All systems are reviewed and are negative except as outlined in the HPI above   Physical Exam: Filed Vitals:   05/28/11 1431  BP: 137/80  Pulse: 73  Height: 5\' 3"  (1.6 m)  Weight: 179 lb (81.194 kg)    GEN- The patient is well appearing, alert and oriented x 3 today.   Head- normocephalic, atraumatic Eyes-  Sclera clear, conjunctiva pink Ears- hearing intact Oropharynx- clear Neck- supple, no JVP Lymph- no cervical lymphadenopathy Lungs- Clear to ausculation bilaterally, normal work of breathing Heart- irregular rate and rhythm,  GI- soft, NT, ND, + BS Extremities- no clubbing, cyanosis, or edema  MS- no significant deformity or atrophy Skin- no rash or lesion Psych- euthymic mood, full affect Neuro- strength and sensation are intact  ekg from 04/29/11 reveals typical appearing atrial flutter  Assessment and Plan:

## 2011-06-27 NOTE — Anesthesia Procedure Notes (Signed)
Procedure Name: MAC Date/Time: 06/27/2011 7:37 AM Performed by: Barrington Ellison Pre-anesthesia Checklist: Patient identified, Emergency Drugs available, Patient being monitored and Suction available Patient Re-evaluated:Patient Re-evaluated prior to inductionOxygen Delivery Method: Simple face mask Intubation Type: IV induction

## 2011-06-27 NOTE — Anesthesia Preprocedure Evaluation (Addendum)
Anesthesia Evaluation  Patient identified by MRN, date of birth, ID band Patient awake    Reviewed: Allergy & Precautions, H&P , NPO status   History of Anesthesia Complications (+) AWARENESS UNDER ANESTHESIA  Airway Mallampati: I TM Distance: >3 FB Neck ROM: full    Dental  (+) Poor Dentition, Missing and Dental Advisory Given   Pulmonary neg pulmonary ROS,    Pulmonary exam normal       Cardiovascular hypertension, Pt. on medications + dysrhythmias Atrial Fibrillation irregular Normal    Neuro/Psych Negative Neurological ROS  Negative Psych ROS   GI/Hepatic negative GI ROS, Neg liver ROS,   Endo/Other  Negative Endocrine ROS  Renal/GU negative Renal ROS  Genitourinary negative   Musculoskeletal   Abdominal   Peds  Hematology negative hematology ROS (+)   Anesthesia Other Findings   Reproductive/Obstetrics                         Anesthesia Physical Anesthesia Plan  ASA: III  Anesthesia Plan: General and MAC   Post-op Pain Management:    Induction: Intravenous  Airway Management Planned: Mask  Additional Equipment:   Intra-op Plan:   Post-operative Plan: Extubation in OR  Informed Consent: I have reviewed the patients History and Physical, chart, labs and discussed the procedure including the risks, benefits and alternatives for the proposed anesthesia with the patient or authorized representative who has indicated his/her understanding and acceptance.     Plan Discussed with: Anesthesiologist, CRNA and Surgeon  Anesthesia Plan Comments:        Anesthesia Quick Evaluation

## 2011-06-27 NOTE — Interval H&P Note (Signed)
History and Physical Interval Note:   06/27/2011   7:20 AM   Brooke Chavez  has presented today for EP study and radiofrequency ablation for recurrent atrial flutter.  The various methods of treatment have been discussed with the patient and family. After consideration of risks, benefits and other options for treatment, the patient has consented to  Procedure(s): EP study and radiofrequency ablation. ABLATION A FLUTTER as a surgical intervention .  The patients' history has been reviewed, patient re-examined, no change in status, stable for surgery.  I have reviewed the patients' chart and labs.  Questions were answered to the patient's satisfaction.     Thompson Grayer  MD

## 2011-06-27 NOTE — Op Note (Signed)
SURGEON:  Thompson Grayer, MD      PREPROCEDURE DIAGNOSIS:  Atrial flutter.      POSTPROCEDURE DIAGNOSIS:  Isthmus-dependent counter clockwise right atrial flutter,  Sick sinus syndrome     PROCEDURES:   1. Comprehensive EP study.   2. Coronary sinus pacing and recording.   3. Mapping of supraventricular tachycardia.   4. Radiofrequency ablation of supraventricular tachycardia.      INTRODUCTION: Brooke Chavez is a 70 y.o. female with a history of recurrent atrial flutter who presents today for EP study and radiofrequency ablation.  She previously underwent CTI ablation for atrial flutter 06/25/2011. She has chronic asymptomatic bradycardia.  She did well until recently.  She has now developed symptoms of weakness and fatigue for which she was found to have atrial flutter with ventricular rates in the 100s.  She now presents for repeat EP study and radiofrequency ablation of her atrial flutter.      DESCRIPTION OF PROCEDURE:  Informed written consent was obtained and the patient was brought to the Electrophysiology Lab in the fasting state.   She was very clear that she had been compliant with Pradaxa 150 mg b.i.d. over the past 3 weeks without interruption.  She was adequately sedated with intravenous medication as outlined in the anesthesia  report.  The patient's right groin was prepped and draped in the usual sterile fashion by the EP Lab staff.  Using a percutaneous Seldinger technique, one 6-French, one 7-French, and one 8-French hemostasis sheaths were placed into the right common femoral vein.  A 6-French decapolar Polaris X catheter was introduced through the right common femoral vein and advanced into the coronary sinus for recording and pacing from this location.  A 6-French quadripolar Josephson catheter was introduced through the right common femoral vein and advanced into the right ventricle for recording and pacing.  This catheter was then pulled back to the His bundle location.  The  patient presented to the Electrophysiology Lab in atrial flutter.  The surface electrocardiogram was consistent with typical atrial flutter.  The atrial flutter cycle length was 300 milliseconds.  The coronary sinus catheter activation was consistent with proximal to distal activation and suggestive of right atrial flutter.  The patient's QRS duration was 109 milliseconds with a QT interval of 412 milliseconds and an HV interval of 54 milliseconds.   Entrainment was performed from the left atrium, which revealed a long postpacing interval.   Entrainment was performed from the cavotricuspid isthmus which revealed a PPI equal to the tachycadria cycle length.  A duodecapolar halo catheter was introduced into the RA and positioned around the tricuspid annulus.  This demonstrated counter clockwise conduction around the TVA.   The patient was therefore felt to have recurrent counter clock wise isthmus-dependent right atrial flutter.     A Biosense Webster 3.5 mm Thermacool ablation catheter was introduced through the right common femoral vein and   advanced into the right atrium.   Mapping was performed along the atrial side of  the cavotricuspid isthmus.  This demonstrated a moderate-sized isthmus.   The patient was found to have recurrent conduction along the ventricular side of the isthmus.  A series of radiofrequency applications were delivered with a target temperature of 40 degrees at 35 watts each.  The tachycardia slowed and then terminated during ablation.  Additional mapping of the atrial signal was performed with additional ablation performed  Following radiofrequency applications, complete bidirectional isthmus block was achieved as evident by differential atrial pacing from  the low lateral right atrium.  The patient was observed for 20 minutes without return of conduction through the cavotricuspid isthmus.  Following ablation, the stimulus to earliest atrial activation recorded across the isthmus line  measured 200 milliseconds.  Following ablation, the AH interval measured 92 milliseconds with an HV interval of 52 milliseconds.  The patient was observed to have junctional rhythm predominantly with occasional sinus beats.  A long sinus node recovery time was also observed following atrial pacing.  As Atrial pacing was performed, which revealed decremental AV conduction with no evidence of PR greater than RR.  The AV Wenckebach cycle length was 410 milliseconds.  Atrial pacing was continued down to a cycle length of 220 milliseconds with no sustained arrhythmias induced.  Ventricular pacing was performed, which revealed midline decremental VA conduction with a retrograde VA block of 460 milliseconds.  No arrhythmias were induced. The procedure was therefore considered completed.  All catheters were removed and the sheaths were aspirated and flushed.  The sheaths were removed and hemostasis was assured.  There were no early apparent complications.      CONCLUSIONS:   1.  Recurrent Isthmus-dependent right atrial flutter upon presentation.   2. Successful radiofrequency ablation of atrial flutter along the cavotricuspid isthmus with complete bidirectional isthmus block achieved.   3. No inducible arrhythmias following ablation.   4. Sinus Node dysfunction (previously observed clinically but without symptoms)  5. No early apparent complications.           Brooke Chavez 06/27/2011 9:51 AM

## 2011-06-28 NOTE — Progress Notes (Signed)
  Subjective:  No chest pain or sob.  Objective:  Vital Signs in the last 24 hours: Temp:  [97.8 F (36.6 C)-98.8 F (37.1 C)] 97.9 F (36.6 C) (11/17 0420) Pulse Rate:  [28-54] 45  (11/17 0420) Resp:  [18-19] 19  (11/17 0420) BP: (108-137)/(55-64) 109/64 mmHg (11/17 0420) SpO2:  [98 %-100 %] 98 % (11/17 0420)  Intake/Output from previous day: 11/16 0701 - 11/17 0700 In: 603 [P.O.:600; I.V.:3] Out: -  Intake/Output from this shift:    Physical Exam: Well appearing NAD HEENT: Unremarkable Neck:  No JVD, no thyromegally Lymphatics:  No adenopathy Back:  No CVA tenderness Lungs:  Clear with no wheezes, rales, or rhonchi HEART:  Regular rate rhythm, no murmurs, no rubs, no clicks Abd:  Flat, positive bowel sounds, no organomegally, no rebound, no guarding Ext:  2 plus pulses, no edema, no cyanosis, no clubbing. Groin without hematoma. Skin:  No rashes no nodules Neuro:  CN II through XII intact, motor grossly intact  Lab Results: No results found for this basename: WBC:2,HGB:2,PLT:2 in the last 72 hours No results found for this basename: NA:2,K:2,CL:2,CO2:2,GLUCOSE:2,BUN:2,CREATININE:2 in the last 72 hours No results found for this basename: TROPONINI:2,CK,MB:2 in the last 72 hours Hepatic Function Panel No results found for this basename: PROT,ALBUMIN,AST,ALT,ALKPHOS,BILITOT,BILIDIR,IBILI in the last 72 hours No results found for this basename: CHOL in the last 72 hours No results found for this basename: PROTIME in the last 72 hours  Cardiac Studies: Tele - NSR/Sinus brady with PAC's Assessment/Plan:  Active Problems: 1.  HYPERTENSION - Vitals are stable. Continue current meds. 2.  Atrial flutter - She is s/p catheter ablation.   3. BRADYCARDIA - She is asymptomatic.    LOS: 1 day    Cristopher Peru 06/28/2011, 7:48 AM

## 2011-06-28 NOTE — Discharge Summary (Signed)
Physician Discharge Summary  Patient ID: Brooke Chavez,  MRN: [id number], DOB/AGE: [birth year] 70 y.o.  Admit date: 06/27/2011 Discharge date: 06/28/2011  Primary Discharge Diagnosis:  1. Atrial flutter  - typical by EKG 9/11 s/p CIT 11/11 2. Chronic asymptomatic bradycardia  Secondary Discharge Diagnosis:  1. Hypertension     2. Hyperlipidemia      3. Mitral regurgitation, mild to moderate   4. TR (tricuspid regurgitation) Mild with RA enlargment   5. DJD (degenerative joint disease)     6. bilateral knee arthroscopy 7. Tubal ligation     8. Lumbar spine surgery      Hospital Course: 70 y/o Brooke Chavez with hx atrial flutter, HTN, HL presented to [name] office with recurrent atrial flutter with associated fatigue and weakness. She also has a history of chronic bradycardia. [name] felt she would benefit from EP study with RF atrial flutter ablation. She came in for this procedure 06/27/11 and had mapping of her SVT as well as RFA. She tolerated the procedure well without complications. The patient was seen and examined by [name] who feels she is stable for discharge.   Discharge Vitals: Blood pressure 109/64, pulse 45, temperature 97.9 Brooke Chavez (36.6 C), temperature source Oral, resp. rate 19, height 5\' 3"  (1.6 m), weight 179 lb (81.194 kg), SpO2 98.00%.  Labs: No labs this admission, but outpatient: Lab Results  Component Value Date   WBC 5.0 06/20/2011   HGB 12.2 06/20/2011   HCT 36.1 06/20/2011   MCV 94.9 06/20/2011   PLT 202.0 06/20/2011    Diagnostic Studies/Procedures:  1. EPS/RFA 06/27/11, please see full report.  Discharge Medications:  Current Discharge Medication List    CONTINUE these medications which have NOT CHANGED   Details  acetaminophen (TYLENOL) 325 MG tablet Take 325 mg by mouth every 6 (six) hours as needed. For pain       amLODipine (NORVASC) 5 MG tablet Take 5 mg by mouth daily.      atorvastatin (LIPITOR) 40 MG tablet Take 40 mg by mouth daily.        cholecalciferol (VITAMIN D) 1000 UNITS tablet Take 1,000 Units by mouth daily.      dabigatran (PRADAXA) 150 MG CAPS Take one by mouth twice daily Qty: 60 capsule, Refills: 6   Associated Diagnoses: Atrial flutter    fish oil-omega-3 fatty acids 1000 MG capsule Take 3 capsules by mouth daily.      olmesartan-hydrochlorothiazide (BENICAR HCT) 40-25 MG per tablet Take 1 tablet by mouth daily.      spironolactone (ALDACTONE) 25 MG tablet Take 25 mg by mouth daily.         Disposition:  The patient will be discharged in stable condition to home. Discharge Orders    Future Orders Please Complete By Expires   Diet - low sodium heart healthy      Increase activity slowly      Scheduling Instructions:   No driving for 1 day. No lifting over 5 lbs for 3 day. No sexual activity for 3 days.    Discharge wound care:      Comments:   Keep procedure site clean & dry. If you notice increased pain, swelling, bleeding or pus, call/return!  You may shower, but no soaking baths/hot tubs/pools for 1 week.       Follow-up Information    Follow up with Thompson Grayer, MD. (Office will call you for an appointment in 4 weeks)    Contact information:   [id number]  [address] [place] [phone]          Duration of Discharge Encounter: Greater than 30 minutes including physician and PA time.  Signed, Melina Copa PA-C 06/28/2011, 8:23 AM

## 2011-07-15 ENCOUNTER — Telehealth: Payer: Self-pay | Admitting: Internal Medicine

## 2011-07-15 NOTE — Telephone Encounter (Signed)
Patient wants samples to be given to [facility] Dr Hochrein's nurse to bring to [facility] next Wed.  07/23/11  She has enough to last until Sat  And will call Dr Tawanna Sat office to see if they can give her enough to last until wed  She will call me back if there is a problem

## 2011-07-15 NOTE — Telephone Encounter (Signed)
New Msg: Pt calling wanting to get samples of pradaxa 150 mg. Please return pt call to discuss further.

## 2011-07-17 ENCOUNTER — Telehealth: Payer: Self-pay | Admitting: Internal Medicine

## 2011-07-17 NOTE — Telephone Encounter (Signed)
Fu Call: Pt calling wanting to see if pt can come to office and pick up samples of pradxa. Please return pt call to discuss further.

## 2011-07-17 NOTE — Telephone Encounter (Signed)
Samples out front for her  Patient is on her way to pick them up

## 2011-07-29 ENCOUNTER — Ambulatory Visit (INDEPENDENT_AMBULATORY_CARE_PROVIDER_SITE_OTHER): Payer: Medicare Other | Admitting: Physician Assistant

## 2011-07-29 ENCOUNTER — Encounter: Payer: Self-pay | Admitting: Physician Assistant

## 2011-07-29 VITALS — BP 145/75 | HR 60 | Ht 63.0 in | Wt 183.0 lb

## 2011-07-29 DIAGNOSIS — I080 Rheumatic disorders of both mitral and aortic valves: Secondary | ICD-10-CM

## 2011-07-29 DIAGNOSIS — I10 Essential (primary) hypertension: Secondary | ICD-10-CM

## 2011-07-29 DIAGNOSIS — R1013 Epigastric pain: Secondary | ICD-10-CM

## 2011-07-29 DIAGNOSIS — K219 Gastro-esophageal reflux disease without esophagitis: Secondary | ICD-10-CM | POA: Insufficient documentation

## 2011-07-29 DIAGNOSIS — I4892 Unspecified atrial flutter: Secondary | ICD-10-CM

## 2011-07-29 DIAGNOSIS — I498 Other specified cardiac arrhythmias: Secondary | ICD-10-CM

## 2011-07-29 DIAGNOSIS — K3189 Other diseases of stomach and duodenum: Secondary | ICD-10-CM

## 2011-07-29 MED ORDER — FAMOTIDINE 20 MG PO TABS: 20.0000 mg | ORAL_TABLET | Freq: Two times a day (BID) | ORAL | Status: DC

## 2011-07-29 NOTE — Assessment & Plan Note (Signed)
Arrange followup with Dr. Percival Spanish in 3 months in [place].  Obtain repeat echocardiogram to reassess MR prior to that visit.

## 2011-07-29 NOTE — Assessment & Plan Note (Signed)
I reviewed her old EKGs and reviewed her EKGs with Dr. [name].   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  She is not symptomatic.  No change in therapy at this time.

## 2011-07-29 NOTE — Progress Notes (Signed)
[place],   [address] Phone: [phone] Fax:  [phone]  Date:  07/29/2011   Name:  Brooke Chavez       DOB:  [birth year] MRN:  [id number]  PCP:  Dr. Laurance Flatten Primary Cardiologist:  Dr. [name]  Primary Electrophysiologist:  Dr. Thompson Grayer    History of Present Illness: Brooke Chavez is a 70 y.o. female who presents for post hospital follow up.  She has a history of hypertension, hyperlipidemia, mild to moderate mitral regurgitation, mild tricuspid regurgitation and atrial flutter.  She had undergone prior ablative therapy.  She had recurrent atrial flutter recently and was set up for repeat RFCA with [name].  This was performed 06/27/11.  This was successful and demonstrated normal sinus rhythm postoperatively.  She had been anticoagulated with Pradaxa.  [name] recommended continuing Pradaxa until she followed up in 4 weeks with him.  Overall, she is doing well.  She denies dyspnea, orthopnea, PND, significant LE edema, lightheadedness, dizziness or syncope.  She denies exertional chest discomfort.  She has noted recent onset of indigestion.  This is associated with meals.  She has mild relief with TUMS therapy.  Past Medical History  Diagnosis Date  . Hypertension     Since 1997  . Hyperlipidemia     x5 years  . Mitral regurgitation     Mild to moderate  . Bradycardia   . 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 Atrial flutter     Typical by EKG diagnosis 9/11 s/p CIT ablation 11/11  . DJD (degenerative joint disease)   . Shortness of breath     Current Outpatient Prescriptions  Medication Sig Dispense Refill  . acetaminophen (TYLENOL) 325 MG tablet Take 325 mg by mouth every 6 (six) hours as needed. For pain         . amLODipine (NORVASC) 5 MG tablet Take 5 mg by mouth daily.        Marland Kitchen atorvastatin (LIPITOR) 40 MG tablet Take 40 mg by mouth daily.        . cholecalciferol (VITAMIN D) 1000 UNITS  tablet Take 1,000 Units by mouth daily.        . dabigatran (PRADAXA) 150 MG CAPS Take one by mouth twice daily  60 capsule  6  . fish oil-omega-3 fatty acids 1000 MG capsule Take 3 capsules by mouth daily.        Marland Kitchen olmesartan-hydrochlorothiazide (BENICAR HCT) 40-25 MG per tablet Take 1 tablet by mouth daily.        Marland Kitchen spironolactone (ALDACTONE) 25 MG tablet Take 25 mg by mouth daily.         Allergies: Allergies  Allergen Reactions  . Aspirin Nausea And Vomiting    History  Substance Use Topics  . Smoking status: Never Smoker   . Smokeless tobacco: Never Used  . Alcohol Use: No     ROS:  Please see the history of present illness.   She denies melena, hematochezia, hematemesis.  All other systems reviewed and negative.   PHYSICAL EXAM: VS:  BP 145/75  Pulse 60  Ht 5\' 3"  (1.6 m)  Wt 183 lb (83.008 kg)  BMI 32.42 kg/m2 Well nourished, well developed, in no acute distress HEENT: normal Neck: JVP 4-5 cm Cardiac:  normal S1, S2; Irregular rhythm; no murmur Lungs:  clear to auscultation bilaterally, no wheezing, rhonchi or rales Abd: soft, nontender, no hepatomegaly Ext: no edema; RFA  site without hematoma or bruit Skin: warm and dry Neuro:  CNs 2-12 intact, no focal abnormalities noted  EKG:   Junctional rhythm with occasional sinus beat, heart rate 60, no significant change when compared to prior tracings  ASSESSMENT AND PLAN:

## 2011-07-29 NOTE — Assessment & Plan Note (Signed)
This may be exacerbated by her Pradaxa.  Start Pepcid 20 mg twice a day.  If she does not have significant relief with this, I have asked her to followup with her PCP.

## 2011-07-29 NOTE — Patient Instructions (Addendum)
Continue Pradaxa for now.  I will discuss with Dr. Thompson Grayer to see if we can get you off of it.   Start Pepcid 20 mg twice a day  Your physician recommends that you schedule a follow-up appointment with [name] in one month  Your physician has requested that you have an echocardiogram. Echocardiography is a painless test that uses sound waves to create images of your heart. It provides your doctor with information about the size and shape of your heart and how well your heart's chambers and valves are working. This procedure takes approximately one hour. There are no restrictions for this procedure.Schedule in 3 months before you see Dr.Hochrein.  Your physician wants you to follow-up in: 42months with Dr. Percival Spanish in [facility] will receive a reminder letter in the mail two months in advance. If you don't receive a letter, please call our office to schedule the follow-up appointment.

## 2011-07-29 NOTE — Assessment & Plan Note (Signed)
Status post repeat RFCA.  I will discuss her case further with [name] to determine whether or not her Pradaxa can be discontinued.  Followup with [name] in one month.

## 2011-07-29 NOTE — Assessment & Plan Note (Signed)
Blood pressure somewhat uncontrolled today.  Continue to monitor for now.

## 2011-07-31 ENCOUNTER — Telehealth: Payer: Self-pay | Admitting: Physician Assistant

## 2011-07-31 NOTE — Telephone Encounter (Signed)
D/w Dr. Thompson Grayer. She can stop her Pradaxa now. Richardson Dopp, PA-C  5:35 PM 07/31/2011

## 2011-08-01 NOTE — Telephone Encounter (Signed)
Patient aware to stop her Pradaxa now.

## 2011-08-12 DIAGNOSIS — S7292XA Unspecified fracture of left femur, initial encounter for closed fracture: Secondary | ICD-10-CM

## 2011-08-12 HISTORY — DX: Unspecified fracture of left femur, initial encounter for closed fracture: S72.92XA

## 2011-09-17 ENCOUNTER — Ambulatory Visit: Payer: Medicare Other | Admitting: Internal Medicine

## 2011-10-13 DIAGNOSIS — I10 Essential (primary) hypertension: Secondary | ICD-10-CM | POA: Diagnosis not present

## 2011-10-13 DIAGNOSIS — E785 Hyperlipidemia, unspecified: Secondary | ICD-10-CM | POA: Diagnosis not present

## 2011-10-13 DIAGNOSIS — E782 Mixed hyperlipidemia: Secondary | ICD-10-CM | POA: Diagnosis not present

## 2011-10-14 ENCOUNTER — Other Ambulatory Visit: Payer: Self-pay

## 2011-10-14 ENCOUNTER — Ambulatory Visit (HOSPITAL_COMMUNITY): Payer: Medicare Other | Attending: Cardiology

## 2011-10-14 DIAGNOSIS — R0609 Other forms of dyspnea: Secondary | ICD-10-CM | POA: Diagnosis not present

## 2011-10-14 DIAGNOSIS — I10 Essential (primary) hypertension: Secondary | ICD-10-CM | POA: Insufficient documentation

## 2011-10-14 DIAGNOSIS — I080 Rheumatic disorders of both mitral and aortic valves: Secondary | ICD-10-CM

## 2011-10-14 DIAGNOSIS — I059 Rheumatic mitral valve disease, unspecified: Secondary | ICD-10-CM

## 2011-10-14 DIAGNOSIS — E785 Hyperlipidemia, unspecified: Secondary | ICD-10-CM | POA: Diagnosis not present

## 2011-10-14 DIAGNOSIS — R55 Syncope and collapse: Secondary | ICD-10-CM | POA: Insufficient documentation

## 2011-10-14 DIAGNOSIS — I4892 Unspecified atrial flutter: Secondary | ICD-10-CM

## 2011-10-14 DIAGNOSIS — R0989 Other specified symptoms and signs involving the circulatory and respiratory systems: Secondary | ICD-10-CM | POA: Insufficient documentation

## 2011-10-15 DIAGNOSIS — S79929A Unspecified injury of unspecified thigh, initial encounter: Secondary | ICD-10-CM | POA: Diagnosis not present

## 2011-10-15 DIAGNOSIS — S72009A Fracture of unspecified part of neck of unspecified femur, initial encounter for closed fracture: Secondary | ICD-10-CM | POA: Diagnosis not present

## 2011-10-15 DIAGNOSIS — R6889 Other general symptoms and signs: Secondary | ICD-10-CM | POA: Diagnosis not present

## 2011-10-15 DIAGNOSIS — S298XXA Other specified injuries of thorax, initial encounter: Secondary | ICD-10-CM | POA: Diagnosis not present

## 2011-10-15 DIAGNOSIS — S79919A Unspecified injury of unspecified hip, initial encounter: Secondary | ICD-10-CM | POA: Diagnosis not present

## 2011-10-15 DIAGNOSIS — E875 Hyperkalemia: Secondary | ICD-10-CM | POA: Diagnosis not present

## 2011-10-15 DIAGNOSIS — D62 Acute posthemorrhagic anemia: Secondary | ICD-10-CM | POA: Diagnosis not present

## 2011-10-15 DIAGNOSIS — E669 Obesity, unspecified: Secondary | ICD-10-CM | POA: Diagnosis not present

## 2011-10-15 DIAGNOSIS — I10 Essential (primary) hypertension: Secondary | ICD-10-CM | POA: Diagnosis not present

## 2011-10-15 DIAGNOSIS — I440 Atrioventricular block, first degree: Secondary | ICD-10-CM | POA: Diagnosis not present

## 2011-10-15 DIAGNOSIS — S72143A Displaced intertrochanteric fracture of unspecified femur, initial encounter for closed fracture: Secondary | ICD-10-CM | POA: Diagnosis not present

## 2011-10-16 ENCOUNTER — Encounter: Payer: Self-pay | Admitting: *Deleted

## 2011-10-16 ENCOUNTER — Telehealth: Payer: Self-pay | Admitting: *Deleted

## 2011-10-16 NOTE — Telephone Encounter (Signed)
Message copied by Michae Kava on Thu Oct 16, 2011  5:01 PM ------      Message from: [place], [place] T      Created: Wed Oct 15, 2011  1:22 PM       Normal LV function      Mild mitral regurgitation      Follow up with Dr. [name] as planned      [facility], PA-C  1:22 PM 10/15/2011

## 2011-10-16 NOTE — Telephone Encounter (Signed)
I called the pt's home # and a woman answered and when I asked to s/w the pt woman abrubtly said NO and hung up on me, so I sent out a results letter today. Brooke Chavez

## 2011-10-21 DIAGNOSIS — Z9181 History of falling: Secondary | ICD-10-CM | POA: Diagnosis not present

## 2011-10-21 DIAGNOSIS — S8290XD Unspecified fracture of unspecified lower leg, subsequent encounter for closed fracture with routine healing: Secondary | ICD-10-CM | POA: Diagnosis not present

## 2011-10-21 DIAGNOSIS — I10 Essential (primary) hypertension: Secondary | ICD-10-CM | POA: Diagnosis not present

## 2011-10-23 DIAGNOSIS — S8290XD Unspecified fracture of unspecified lower leg, subsequent encounter for closed fracture with routine healing: Secondary | ICD-10-CM | POA: Diagnosis not present

## 2011-10-23 DIAGNOSIS — Z9181 History of falling: Secondary | ICD-10-CM | POA: Diagnosis not present

## 2011-10-23 DIAGNOSIS — I10 Essential (primary) hypertension: Secondary | ICD-10-CM | POA: Diagnosis not present

## 2011-10-24 DIAGNOSIS — S8290XD Unspecified fracture of unspecified lower leg, subsequent encounter for closed fracture with routine healing: Secondary | ICD-10-CM | POA: Diagnosis not present

## 2011-10-24 DIAGNOSIS — I10 Essential (primary) hypertension: Secondary | ICD-10-CM | POA: Diagnosis not present

## 2011-10-24 DIAGNOSIS — Z9181 History of falling: Secondary | ICD-10-CM | POA: Diagnosis not present

## 2011-10-27 ENCOUNTER — Ambulatory Visit: Payer: Medicare Other | Admitting: Internal Medicine

## 2011-10-28 DIAGNOSIS — Z9181 History of falling: Secondary | ICD-10-CM | POA: Diagnosis not present

## 2011-10-28 DIAGNOSIS — I10 Essential (primary) hypertension: Secondary | ICD-10-CM | POA: Diagnosis not present

## 2011-10-28 DIAGNOSIS — S8290XD Unspecified fracture of unspecified lower leg, subsequent encounter for closed fracture with routine healing: Secondary | ICD-10-CM | POA: Diagnosis not present

## 2011-10-29 ENCOUNTER — Encounter: Payer: Self-pay | Admitting: Cardiology

## 2011-10-29 ENCOUNTER — Ambulatory Visit: Payer: Medicare Other | Admitting: Internal Medicine

## 2011-10-29 ENCOUNTER — Ambulatory Visit (INDEPENDENT_AMBULATORY_CARE_PROVIDER_SITE_OTHER): Payer: Medicare Other | Admitting: Cardiology

## 2011-10-29 VITALS — BP 100/60 | HR 52 | Ht 62.0 in | Wt 176.0 lb

## 2011-10-29 DIAGNOSIS — I10 Essential (primary) hypertension: Secondary | ICD-10-CM | POA: Diagnosis not present

## 2011-10-29 DIAGNOSIS — R001 Bradycardia, unspecified: Secondary | ICD-10-CM

## 2011-10-29 DIAGNOSIS — I080 Rheumatic disorders of both mitral and aortic valves: Secondary | ICD-10-CM | POA: Diagnosis not present

## 2011-10-29 DIAGNOSIS — I498 Other specified cardiac arrhythmias: Secondary | ICD-10-CM

## 2011-10-29 DIAGNOSIS — I4892 Unspecified atrial flutter: Secondary | ICD-10-CM | POA: Diagnosis not present

## 2011-10-29 NOTE — Patient Instructions (Signed)
The current medical regimen is effective;  continue present plan and medications.  Follow up in 6 months with Dr Hochrein.  You will receive a letter in the mail 2 months before you are due.  Please call us when you receive this letter to schedule your follow up appointment.  

## 2011-10-29 NOTE — Progress Notes (Signed)
   HPI The patient presents for follow up of atrial flutter.   She is s/p ablation last year although she has had paroxysms post ablation.  She had a redo ablation.  She seems to be maintaining sinus rhythm. She's had no symptomatic paroxysms. Unfortunately she fell and broke her hip surgery at [facility]. She had no cardiovascular complications with that. She has had a followup echocardiogram which demonstrated mild mitral regurgitation but preserved left ventricular function. She denies any presyncope or syncope. She has had no chest pressure, neck or arm discomfort. She has had no shortness of breath.   Allergies  Allergen Reactions  . Aspirin Nausea And Vomiting    Current Outpatient Prescriptions  Medication Sig Dispense Refill  . acetaminophen (TYLENOL) 325 MG tablet Take 325 mg by mouth every 6 (six) hours as needed. For pain         . amLODipine (NORVASC) 5 MG tablet Take 5 mg by mouth daily.        Marland Kitchen atorvastatin (LIPITOR) 40 MG tablet Take 40 mg by mouth daily.        . cholecalciferol (VITAMIN D) 1000 UNITS tablet Take 1,000 Units by mouth daily.        . fish oil-omega-3 fatty acids 1000 MG capsule Take 3 capsules by mouth daily.        Marland Kitchen olmesartan-hydrochlorothiazide (BENICAR HCT) 40-25 MG per tablet Take 1 tablet by mouth daily.        Marland Kitchen spironolactone (ALDACTONE) 25 MG tablet Take 25 mg by mouth daily.         Past Medical History  Diagnosis Date  . Hypertension     Since 1997  . Hyperlipidemia     x5 years  . Mitral regurgitation     Mild to moderate  . Bradycardia   . 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 Atrial flutter     Typical by EKG diagnosis 9/11 s/p CIT ablation 11/11  . DJD (degenerative joint disease)   . Shortness of breath     Past Surgical History  Procedure Date  . Knee arthroscopy     Right  . Tubal ligation   . Lumbar spine surgery   . Total knee arthroplasty     Left  . Atrial ablation surgery 06/2010     ROS:  As stated in the HPI and negative for all other systems.  PHYSICAL EXAM BP 100/60  Pulse 52  Ht 5\' 2"  (1.575 m)  Wt 176 lb (79.833 kg)  BMI 32.19 kg/m2 PHYSICAL EXAM GEN:  No distress NECK:  No jugular venous distention at 90 degrees, waveform within normal limits, carotid upstroke brisk and symmetric, no bruits, no thyromegaly LYMPHATICS:  No cervical adenopathy LUNGS:  Clear to auscultation bilaterally BACK:  No CVA tenderness CHEST:  Unremarkable HEART:  S1 and S2 within normal limits, no S3, no S4, no clicks, no rubs, no murmurs ABD:  Positive bowel sounds normal in frequency in pitch, no bruits, no rebound, no guarding, unable to assess midline mass or bruit with the patient seated. EXT:  2 plus pulses throughout, moderate edema, no cyanosis no clubbing  EKG:  Sinus rhythm with sinus arrhythmia, left ventricular hypertrophy by voltage criteria, left axis deviation, rate 52, no acute ST-T wave changes. 10/29/2011   ASSESSMENT AND PLAN

## 2011-10-29 NOTE — Assessment & Plan Note (Signed)
No further cardiovascular testing is indicated.  She will continue the meds as listed.

## 2011-10-29 NOTE — Assessment & Plan Note (Signed)
This is mild and no further testing is indicated.

## 2011-10-29 NOTE — Assessment & Plan Note (Signed)
The blood pressure is at target. No change in medications is indicated. We will continue with therapeutic lifestyle changes (TLC).  

## 2011-10-30 DIAGNOSIS — Z9181 History of falling: Secondary | ICD-10-CM | POA: Diagnosis not present

## 2011-10-30 DIAGNOSIS — S8290XD Unspecified fracture of unspecified lower leg, subsequent encounter for closed fracture with routine healing: Secondary | ICD-10-CM | POA: Diagnosis not present

## 2011-10-30 DIAGNOSIS — I10 Essential (primary) hypertension: Secondary | ICD-10-CM | POA: Diagnosis not present

## 2011-10-31 DIAGNOSIS — I10 Essential (primary) hypertension: Secondary | ICD-10-CM | POA: Diagnosis not present

## 2011-10-31 DIAGNOSIS — Z9181 History of falling: Secondary | ICD-10-CM | POA: Diagnosis not present

## 2011-10-31 DIAGNOSIS — S8290XD Unspecified fracture of unspecified lower leg, subsequent encounter for closed fracture with routine healing: Secondary | ICD-10-CM | POA: Diagnosis not present

## 2011-11-03 DIAGNOSIS — Z9181 History of falling: Secondary | ICD-10-CM | POA: Diagnosis not present

## 2011-11-03 DIAGNOSIS — I10 Essential (primary) hypertension: Secondary | ICD-10-CM | POA: Diagnosis not present

## 2011-11-03 DIAGNOSIS — S8290XD Unspecified fracture of unspecified lower leg, subsequent encounter for closed fracture with routine healing: Secondary | ICD-10-CM | POA: Diagnosis not present

## 2011-11-06 DIAGNOSIS — S8290XD Unspecified fracture of unspecified lower leg, subsequent encounter for closed fracture with routine healing: Secondary | ICD-10-CM | POA: Diagnosis not present

## 2011-11-06 DIAGNOSIS — I10 Essential (primary) hypertension: Secondary | ICD-10-CM | POA: Diagnosis not present

## 2011-11-06 DIAGNOSIS — Z9181 History of falling: Secondary | ICD-10-CM | POA: Diagnosis not present

## 2011-11-07 DIAGNOSIS — I10 Essential (primary) hypertension: Secondary | ICD-10-CM | POA: Diagnosis not present

## 2011-11-07 DIAGNOSIS — Z9181 History of falling: Secondary | ICD-10-CM | POA: Diagnosis not present

## 2011-11-07 DIAGNOSIS — S8290XD Unspecified fracture of unspecified lower leg, subsequent encounter for closed fracture with routine healing: Secondary | ICD-10-CM | POA: Diagnosis not present

## 2011-11-11 DIAGNOSIS — S8290XD Unspecified fracture of unspecified lower leg, subsequent encounter for closed fracture with routine healing: Secondary | ICD-10-CM | POA: Diagnosis not present

## 2011-11-11 DIAGNOSIS — I10 Essential (primary) hypertension: Secondary | ICD-10-CM | POA: Diagnosis not present

## 2011-11-11 DIAGNOSIS — Z9181 History of falling: Secondary | ICD-10-CM | POA: Diagnosis not present

## 2011-11-13 DIAGNOSIS — S72009A Fracture of unspecified part of neck of unspecified femur, initial encounter for closed fracture: Secondary | ICD-10-CM | POA: Diagnosis not present

## 2011-11-14 DIAGNOSIS — Z9181 History of falling: Secondary | ICD-10-CM | POA: Diagnosis not present

## 2011-11-14 DIAGNOSIS — S8290XD Unspecified fracture of unspecified lower leg, subsequent encounter for closed fracture with routine healing: Secondary | ICD-10-CM | POA: Diagnosis not present

## 2011-11-14 DIAGNOSIS — I10 Essential (primary) hypertension: Secondary | ICD-10-CM | POA: Diagnosis not present

## 2011-11-26 DIAGNOSIS — S72009D Fracture of unspecified part of neck of unspecified femur, subsequent encounter for closed fracture with routine healing: Secondary | ICD-10-CM | POA: Diagnosis not present

## 2011-12-01 ENCOUNTER — Ambulatory Visit: Payer: Medicare Other | Admitting: Physical Therapy

## 2011-12-02 ENCOUNTER — Ambulatory Visit: Payer: Medicare Other | Attending: Orthopedic Surgery | Admitting: Physical Therapy

## 2011-12-02 DIAGNOSIS — M25559 Pain in unspecified hip: Secondary | ICD-10-CM | POA: Insufficient documentation

## 2011-12-02 DIAGNOSIS — R5381 Other malaise: Secondary | ICD-10-CM | POA: Diagnosis not present

## 2011-12-02 DIAGNOSIS — R269 Unspecified abnormalities of gait and mobility: Secondary | ICD-10-CM | POA: Insufficient documentation

## 2011-12-02 DIAGNOSIS — IMO0001 Reserved for inherently not codable concepts without codable children: Secondary | ICD-10-CM | POA: Insufficient documentation

## 2011-12-04 ENCOUNTER — Ambulatory Visit: Payer: Medicare Other | Admitting: Physical Therapy

## 2011-12-09 ENCOUNTER — Ambulatory Visit: Payer: Medicare Other | Admitting: Physical Therapy

## 2011-12-11 ENCOUNTER — Ambulatory Visit: Payer: Medicare Other | Attending: Orthopedic Surgery | Admitting: Physical Therapy

## 2011-12-11 DIAGNOSIS — R5381 Other malaise: Secondary | ICD-10-CM | POA: Diagnosis not present

## 2011-12-11 DIAGNOSIS — R269 Unspecified abnormalities of gait and mobility: Secondary | ICD-10-CM | POA: Insufficient documentation

## 2011-12-11 DIAGNOSIS — IMO0001 Reserved for inherently not codable concepts without codable children: Secondary | ICD-10-CM | POA: Diagnosis not present

## 2011-12-11 DIAGNOSIS — M25559 Pain in unspecified hip: Secondary | ICD-10-CM | POA: Diagnosis not present

## 2011-12-16 ENCOUNTER — Ambulatory Visit: Payer: Medicare Other | Admitting: *Deleted

## 2011-12-18 ENCOUNTER — Ambulatory Visit: Payer: Medicare Other | Admitting: *Deleted

## 2011-12-23 ENCOUNTER — Ambulatory Visit: Payer: Medicare Other | Admitting: Physical Therapy

## 2011-12-25 ENCOUNTER — Ambulatory Visit: Payer: Medicare Other | Admitting: *Deleted

## 2011-12-30 ENCOUNTER — Ambulatory Visit: Payer: Medicare Other | Admitting: *Deleted

## 2012-01-01 ENCOUNTER — Ambulatory Visit: Payer: Medicare Other | Admitting: *Deleted

## 2012-01-06 ENCOUNTER — Ambulatory Visit: Payer: Medicare Other | Admitting: *Deleted

## 2012-01-08 ENCOUNTER — Ambulatory Visit: Payer: Medicare Other | Admitting: *Deleted

## 2012-01-13 ENCOUNTER — Ambulatory Visit: Payer: Medicare Other | Attending: Orthopedic Surgery | Admitting: *Deleted

## 2012-01-13 DIAGNOSIS — IMO0001 Reserved for inherently not codable concepts without codable children: Secondary | ICD-10-CM | POA: Insufficient documentation

## 2012-01-13 DIAGNOSIS — M25559 Pain in unspecified hip: Secondary | ICD-10-CM | POA: Insufficient documentation

## 2012-01-13 DIAGNOSIS — R269 Unspecified abnormalities of gait and mobility: Secondary | ICD-10-CM | POA: Diagnosis not present

## 2012-01-13 DIAGNOSIS — R5381 Other malaise: Secondary | ICD-10-CM | POA: Insufficient documentation

## 2012-01-15 ENCOUNTER — Ambulatory Visit: Payer: Medicare Other | Admitting: *Deleted

## 2012-01-16 DIAGNOSIS — M1710 Unilateral primary osteoarthritis, unspecified knee: Secondary | ICD-10-CM | POA: Diagnosis not present

## 2012-01-16 DIAGNOSIS — IMO0002 Reserved for concepts with insufficient information to code with codable children: Secondary | ICD-10-CM | POA: Diagnosis not present

## 2012-01-20 ENCOUNTER — Ambulatory Visit: Payer: Medicare Other | Admitting: *Deleted

## 2012-01-20 DIAGNOSIS — R5381 Other malaise: Secondary | ICD-10-CM | POA: Diagnosis not present

## 2012-01-20 DIAGNOSIS — R5383 Other fatigue: Secondary | ICD-10-CM | POA: Diagnosis not present

## 2012-01-20 DIAGNOSIS — E785 Hyperlipidemia, unspecified: Secondary | ICD-10-CM | POA: Diagnosis not present

## 2012-01-20 DIAGNOSIS — I10 Essential (primary) hypertension: Secondary | ICD-10-CM | POA: Diagnosis not present

## 2012-01-20 DIAGNOSIS — Z0000 Encounter for general adult medical examination without abnormal findings: Secondary | ICD-10-CM | POA: Diagnosis not present

## 2012-01-20 DIAGNOSIS — R609 Edema, unspecified: Secondary | ICD-10-CM | POA: Diagnosis not present

## 2012-01-20 DIAGNOSIS — E782 Mixed hyperlipidemia: Secondary | ICD-10-CM | POA: Diagnosis not present

## 2012-01-20 DIAGNOSIS — Z124 Encounter for screening for malignant neoplasm of cervix: Secondary | ICD-10-CM | POA: Diagnosis not present

## 2012-01-20 DIAGNOSIS — Z79899 Other long term (current) drug therapy: Secondary | ICD-10-CM | POA: Diagnosis not present

## 2012-01-21 DIAGNOSIS — Z4789 Encounter for other orthopedic aftercare: Secondary | ICD-10-CM | POA: Diagnosis not present

## 2012-01-21 DIAGNOSIS — S72009A Fracture of unspecified part of neck of unspecified femur, initial encounter for closed fracture: Secondary | ICD-10-CM | POA: Diagnosis not present

## 2012-01-22 ENCOUNTER — Encounter: Payer: Medicare Other | Admitting: *Deleted

## 2012-01-27 ENCOUNTER — Ambulatory Visit: Payer: Medicare Other | Admitting: *Deleted

## 2012-05-05 ENCOUNTER — Ambulatory Visit (INDEPENDENT_AMBULATORY_CARE_PROVIDER_SITE_OTHER): Payer: Medicare Other | Admitting: Cardiology

## 2012-05-05 ENCOUNTER — Encounter: Payer: Self-pay | Admitting: Cardiology

## 2012-05-05 VITALS — BP 130/80 | HR 51 | Ht 63.0 in | Wt 173.0 lb

## 2012-05-05 DIAGNOSIS — I4892 Unspecified atrial flutter: Secondary | ICD-10-CM

## 2012-05-05 DIAGNOSIS — I10 Essential (primary) hypertension: Secondary | ICD-10-CM | POA: Diagnosis not present

## 2012-05-05 DIAGNOSIS — I499 Cardiac arrhythmia, unspecified: Secondary | ICD-10-CM | POA: Diagnosis not present

## 2012-05-05 NOTE — Progress Notes (Signed)
HPI The patient presents for follow up of atrial flutter.   She is s/p ablation last year although she has had paroxysms post ablation.  She had a redo ablation.  She seems to be maintaining sinus rhythm. She's had no symptomatic paroxysms. She is having a slow recovery from a broken hip in the spring. She's still doing a little physical therapy.  She walks with a cane. She's had one episode of some lightheadedness but no presyncope or syncope. She's not feeling any tachycardia. She has no new shortness of breath, PND or orthopnea. She's had no weight gain or edema.  Allergies  Allergen Reactions  . Aspirin Nausea And Vomiting    Current Outpatient Prescriptions  Medication Sig Dispense Refill  . acetaminophen (TYLENOL) 325 MG tablet Take 325 mg by mouth every 6 (six) hours as needed. For pain         . amLODipine (NORVASC) 5 MG tablet Take 5 mg by mouth daily.        Marland Kitchen atorvastatin (LIPITOR) 40 MG tablet Take 40 mg by mouth daily.        . cholecalciferol (VITAMIN D) 1000 UNITS tablet Take 1,000 Units by mouth daily.        . fish oil-omega-3 fatty acids 1000 MG capsule Take 3 capsules by mouth daily.        Marland Kitchen olmesartan-hydrochlorothiazide (BENICAR HCT) 40-25 MG per tablet Take 1 tablet by mouth daily.        Marland Kitchen spironolactone (ALDACTONE) 25 MG tablet Take 25 mg by mouth daily.       Marland Kitchen DISCONTD: famotidine (PEPCID) 20 MG tablet Take 1 tablet (20 mg total) by mouth 2 (two) times daily.  60 tablet  6    Past Medical History  Diagnosis Date  . Hypertension     Since 1997  . Hyperlipidemia     x5 years  . Mitral regurgitation     Mild to moderate  . Bradycardia   . 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 Atrial flutter     Typical by EKG diagnosis 9/11 s/p CIT ablation 11/11  . DJD (degenerative joint disease)   . Shortness of breath     Past Surgical History  Procedure Date  . Knee arthroscopy     Right  . Tubal ligation   . Lumbar spine surgery   .  Total knee arthroplasty     Left  . Atrial ablation surgery 06/2010    ROS:  As stated in the HPI and negative for all other systems.  PHYSICAL EXAM BP 130/80  Pulse 51  Ht 5\' 3"  (1.6 m)  Wt 173 lb (78.472 kg)  BMI 30.65 kg/m2 PHYSICAL EXAM GENERAL:  Well appearing HEENT:  Pupils equal round and reactive, fundi not visualized, oral mucosa unremarkable, dentures NECK:  No jugular venous distention, waveform within normal limits, carotid upstroke brisk and symmetric, no bruits, no thyromegaly LYMPHATICS:  No cervical, inguinal adenopathy LUNGS:  Clear to auscultation bilaterally BACK:  No CVA tenderness CHEST:  Unremarkable HEART:  PMI not displaced or sustained,S1 and S2 within normal limits, no S3, no S4, no clicks, no rubs, soft apical systolic murmur without radiation. ABD:  Flat, positive bowel sounds normal in frequency in pitch, no bruits, no rebound, no guarding, no midline pulsatile mass, no hepatomegaly, no splenomegaly EXT:  2 plus pulses throughout, no edema, no cyanosis no clubbing SKIN:  No rashes no nodules NEURO:  Cranial  nerves II through XII grossly intact, motor grossly intact throughout [facility]:  Cognitively intact, oriented to person place and time   EKG:  Junctional rhythm with probable premature atrial and possible ventricular contractions, interventricular conduction delay, left axis deviation, no acute ST-T wave changes. 05/05/2012   ASSESSMENT AND PLAN  Atrial flutter -  Her rhythm is slowly than previous. She did have one episode of lightheadedness. I will apply a 24-hour Holter to look for any significant bradycardia arrhythmias. If she has recurrent symptoms she may need further monitoring.  MITRAL REGURGITATION -  This is mild and no further testing is indicated.  I will follow this clinically and with repeat echocardiograms.   HYPERTENSION -  The blood pressure is at target. No change in medications is indicated. We will continue with therapeutic  lifestyle changes (TLC).  She does have some mild renal insufficiency of 1.48 in June and is due to have follow this with Redge Gainer, MD

## 2012-05-05 NOTE — Patient Instructions (Addendum)
The current medical regimen is effective;  continue present plan and medications.  Your physician has recommended that you wear a holter monitor. Holter monitors are medical devices that record the heart's electrical activity. Doctors most often use these monitors to diagnose arrhythmias. Arrhythmias are problems with the speed or rhythm of the heartbeat. The monitor is a small, portable device. You can wear one while you do your normal daily activities. This is usually used to diagnose what is causing palpitations/syncope (passing out).  Follow up in 6 months with Dr Percival Spanish.  You will receive a letter in the mail 2 months before you are due.  Please call us when you receive this letter to schedule your follow up appointment.

## 2012-05-07 DIAGNOSIS — I4891 Unspecified atrial fibrillation: Secondary | ICD-10-CM | POA: Diagnosis not present

## 2012-05-19 DIAGNOSIS — M1990 Unspecified osteoarthritis, unspecified site: Secondary | ICD-10-CM | POA: Diagnosis not present

## 2012-05-19 DIAGNOSIS — IMO0002 Reserved for concepts with insufficient information to code with codable children: Secondary | ICD-10-CM | POA: Diagnosis not present

## 2012-05-19 DIAGNOSIS — M169 Osteoarthritis of hip, unspecified: Secondary | ICD-10-CM | POA: Diagnosis not present

## 2012-05-19 DIAGNOSIS — M899 Disorder of bone, unspecified: Secondary | ICD-10-CM | POA: Diagnosis not present

## 2012-05-19 DIAGNOSIS — S7290XD Unspecified fracture of unspecified femur, subsequent encounter for closed fracture with routine healing: Secondary | ICD-10-CM | POA: Diagnosis not present

## 2012-05-26 DIAGNOSIS — R609 Edema, unspecified: Secondary | ICD-10-CM | POA: Diagnosis not present

## 2012-05-26 DIAGNOSIS — I10 Essential (primary) hypertension: Secondary | ICD-10-CM | POA: Diagnosis not present

## 2012-05-26 DIAGNOSIS — E785 Hyperlipidemia, unspecified: Secondary | ICD-10-CM | POA: Diagnosis not present

## 2012-06-16 ENCOUNTER — Ambulatory Visit (INDEPENDENT_AMBULATORY_CARE_PROVIDER_SITE_OTHER): Payer: Medicare Other | Admitting: Cardiology

## 2012-06-16 ENCOUNTER — Encounter: Payer: Self-pay | Admitting: Cardiology

## 2012-06-16 VITALS — BP 130/60 | HR 50 | Ht 63.0 in | Wt 169.0 lb

## 2012-06-16 DIAGNOSIS — I10 Essential (primary) hypertension: Secondary | ICD-10-CM | POA: Diagnosis not present

## 2012-06-16 DIAGNOSIS — I4892 Unspecified atrial flutter: Secondary | ICD-10-CM

## 2012-06-16 DIAGNOSIS — I080 Rheumatic disorders of both mitral and aortic valves: Secondary | ICD-10-CM

## 2012-06-16 DIAGNOSIS — E785 Hyperlipidemia, unspecified: Secondary | ICD-10-CM

## 2012-06-16 NOTE — Patient Instructions (Addendum)
The current medical regimen is effective;  continue present plan and medications.  Follow up in 6 months with Dr Hochrein.  You will receive a letter in the mail 2 months before you are due.  Please call us when you receive this letter to schedule your follow up appointment.  

## 2012-06-16 NOTE — Progress Notes (Signed)
HPI The patient presents for follow up of atrial flutter.   She is s/p ablation last year although she has had paroxysms post ablation.  She had a redo ablation. When I last saw her she had junctional bradycardia. I had her wear a monitor. I reviewed that with her today. She had some paroxysms of probable fibrillation though these were very brief period.  She had lots of atrial ectopy and runs of atrial tachycardia. However, she had no sustained tachyarrhythmias. She had some bradycardic episodes but no sustained bradycardia arrhythmias. There were some junctional beats. She today says that she does not feel any tachycardia palpitations or any of the tiredness that she had previously. She certainly has had no presyncope or syncope. She has had no chest pressure, neck or arm discomfort. She has had no weight gain or edema.   Allergies  Allergen Reactions  . Aspirin Nausea And Vomiting    Current Outpatient Prescriptions  Medication Sig Dispense Refill  . acetaminophen (TYLENOL) 325 MG tablet Take 325 mg by mouth every 6 (six) hours as needed. For pain         . amLODipine (NORVASC) 5 MG tablet Take 5 mg by mouth daily.        Marland Kitchen atorvastatin (LIPITOR) 40 MG tablet Take 40 mg by mouth daily.        . cholecalciferol (VITAMIN D) 1000 UNITS tablet Take 1,000 Units by mouth daily.        Marland Kitchen ezetimibe (ZETIA) 10 MG tablet Take 10 mg by mouth daily.      . fish oil-omega-3 fatty acids 1000 MG capsule Take 3 capsules by mouth daily.        Marland Kitchen olmesartan-hydrochlorothiazide (BENICAR HCT) 40-25 MG per tablet Take 1 tablet by mouth daily.        Marland Kitchen spironolactone (ALDACTONE) 25 MG tablet Take 25 mg by mouth daily.       . [DISCONTINUED] famotidine (PEPCID) 20 MG tablet Take 1 tablet (20 mg total) by mouth 2 (two) times daily.  60 tablet  6    Past Medical History  Diagnosis Date  . Hypertension     Since 1997  . Hyperlipidemia     x5 years  . Mitral regurgitation     Mild to moderate  .  Bradycardia   . 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 Atrial flutter     Typical by EKG diagnosis 9/11 s/p CIT ablation 11/11  . DJD (degenerative joint disease)   . Shortness of breath     Past Surgical History  Procedure Date  . Knee arthroscopy     Right  . Tubal ligation   . Lumbar spine surgery   . Total knee arthroplasty     Left  . Atrial ablation surgery 06/2010  . Hip surgery     Left (fracture) 3/13    ROS:  As stated in the HPI and negative for all other systems.  PHYSICAL EXAM BP 130/60  Pulse 50  Ht 5\' 3"  (1.6 m)  Wt 169 lb (76.658 kg)  BMI 29.94 kg/m2 PHYSICAL EXAM GENERAL:  Well appearing HEENT:  Pupils equal round and reactive, fundi not visualized, oral mucosa unremarkable, dentures NECK:  No jugular venous distention, waveform within normal limits, carotid upstroke brisk and symmetric, no bruits, no thyromegaly LYMPHATICS:  No cervical, inguinal adenopathy LUNGS:  Clear to auscultation bilaterally BACK:  No CVA tenderness CHEST:  Unremarkable HEART:  PMI  not displaced or sustained,S1 and S2 within normal limits, no S3, no S4, no clicks, no rubs, soft apical systolic murmur without radiation. ABD:  Flat, positive bowel sounds normal in frequency in pitch, no bruits, no rebound, no guarding, no midline pulsatile mass, no hepatomegaly, no splenomegaly EXT:  2 plus pulses throughout, no edema, no cyanosis no clubbing SKIN:  No rashes no nodules NEURO:  Cranial nerves II through XII grossly intact, motor grossly intact throughout PSYCH:  Cognitively intact, oriented to person place and time   EKG:  Junctional rhythm with probable premature atrial and possible ventricular contractions, interventricular conduction delay, left axis deviation, no acute ST-T wave changes. 06/16/2012   ASSESSMENT AND PLAN  Atrial flutter -  I reviewed with her the Holter that she wore recently. She has brief runs of bradycardia arrhythmia with probable  junctional rhythm but no sustained pauses. She has some tachycardia arrhythmias and may actually have some short runs of fibrillation though the mechanism is not clear. We discussed the risks benefits of anticoagulation given this rhythm and she would prefer to avoid any blood thinners. Therefore, no change in therapy is indicated.  MITRAL REGURGITATION -  This is mild and no further testing is indicated.  I will follow this clinically and with repeat echocardiograms.   HYPERTENSION -  The blood pressure is at target. No change in medications is indicated. We will continue with therapeutic lifestyle changes (TLC).

## 2012-08-24 DIAGNOSIS — E559 Vitamin D deficiency, unspecified: Secondary | ICD-10-CM | POA: Diagnosis not present

## 2012-08-24 DIAGNOSIS — I4891 Unspecified atrial fibrillation: Secondary | ICD-10-CM | POA: Diagnosis not present

## 2012-08-24 DIAGNOSIS — E785 Hyperlipidemia, unspecified: Secondary | ICD-10-CM | POA: Diagnosis not present

## 2012-08-24 DIAGNOSIS — M810 Age-related osteoporosis without current pathological fracture: Secondary | ICD-10-CM | POA: Diagnosis not present

## 2012-08-24 DIAGNOSIS — I10 Essential (primary) hypertension: Secondary | ICD-10-CM | POA: Diagnosis not present

## 2012-09-29 DIAGNOSIS — M810 Age-related osteoporosis without current pathological fracture: Secondary | ICD-10-CM | POA: Diagnosis not present

## 2012-09-29 DIAGNOSIS — M25559 Pain in unspecified hip: Secondary | ICD-10-CM | POA: Diagnosis not present

## 2012-11-02 DIAGNOSIS — Z1231 Encounter for screening mammogram for malignant neoplasm of breast: Secondary | ICD-10-CM | POA: Diagnosis not present

## 2012-11-02 LAB — HM MAMMOGRAPHY: HM Mammogram: NEGATIVE

## 2012-11-04 ENCOUNTER — Encounter: Payer: Self-pay | Admitting: Nurse Practitioner

## 2012-11-05 ENCOUNTER — Ambulatory Visit (INDEPENDENT_AMBULATORY_CARE_PROVIDER_SITE_OTHER): Payer: Medicare Other | Admitting: Nurse Practitioner

## 2012-11-05 ENCOUNTER — Encounter: Payer: Self-pay | Admitting: Nurse Practitioner

## 2012-11-05 VITALS — BP 116/53 | HR 44 | Temp 97.8°F | Ht 63.0 in | Wt 166.0 lb

## 2012-11-05 DIAGNOSIS — R609 Edema, unspecified: Secondary | ICD-10-CM

## 2012-11-05 NOTE — Progress Notes (Signed)
Subjective:    Patient ID: Brooke Chavez, female    DOB: 1941/05/31, 72 y.o.   MRN: [id number]  HPIPatient in C/O lower extremity edema. Use to be on spirolactone and was stopped by clinical pharmacist. Since stopping has had lower extremity edema. Stopped spirolactone due to also being on Diovan/HCTZ. Patient started taking spirolactone herself and edema resolved.    Review of Systems  Constitutional: Negative.   Respiratory: Negative.   Cardiovascular: Negative.   Musculoskeletal: Negative.    Allergies  Allergen Reactions  . Aspirin Nausea And Vomiting    Outpatient Encounter Prescriptions as of 11/05/2012  Medication Sig Dispense Refill  . acetaminophen (TYLENOL) 325 MG tablet Take 325 mg by mouth every 6 (six) hours as needed. For pain         . amLODipine (NORVASC) 5 MG tablet Take 5 mg by mouth daily.        Marland Kitchen atorvastatin (LIPITOR) 40 MG tablet Take 40 mg by mouth daily.        Marland Kitchen olmesartan-hydrochlorothiazide (BENICAR HCT) 40-25 MG per tablet Take 1 tablet by mouth daily.        Marland Kitchen spironolactone (ALDACTONE) 25 MG tablet Take 25 mg by mouth daily.       . [DISCONTINUED] cholecalciferol (VITAMIN D) 1000 UNITS tablet Take 1,000 Units by mouth daily.        . [DISCONTINUED] ezetimibe (ZETIA) 10 MG tablet Take 10 mg by mouth daily.      . [DISCONTINUED] fish oil-omega-3 fatty acids 1000 MG capsule Take 3 capsules by mouth daily.         No facility-administered encounter medications on file as of 11/05/2012.    Past Medical History  Diagnosis Date  . Hypertension     Since 1997  . Hyperlipidemia     x5 years  . Mitral regurgitation     Mild to moderate  . Bradycardia   . 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 Atrial flutter     Typical by EKG diagnosis 9/11 s/p CIT ablation 11/11  . DJD (degenerative joint disease)   . Shortness of breath     Past Surgical History  Procedure Laterality Date  . Knee arthroscopy      Right  . Tubal ligation     . Lumbar spine surgery    . Total knee arthroplasty      Left  . Atrial ablation surgery  06/2010  . Hip surgery      Left (fracture) 3/13    History   Social History  . Marital Status: Widowed    Spouse Name: N/A    Number of Children: N/A  . Years of Education: N/A   Occupational History  . Not on file.   Social History Main Topics  . Smoking status: Never Smoker   . Smokeless tobacco: Never Used  . Alcohol Use: No  . Drug Use: No  . Sexually Active: Not on file   Other Topics Concern  . Not on file   Social History Narrative   Lives in [place]   Her daughter lives with her          Objective:   Physical Exam  Constitutional: She is oriented to person, place, and time. She appears well-developed and well-nourished.  Cardiovascular: Normal rate, regular rhythm, normal heart sounds and intact distal pulses.   Pulmonary/Chest: Effort normal and breath sounds normal.  Musculoskeletal: She exhibits edema (mild right LE 1+ left  LE).  Neurological: She is alert and oriented to person, place, and time.  Skin: Skin is warm and dry.  Psychiatric: She has a normal mood and affect. Her behavior is normal. Judgment and thought content normal.   BP 116/53  Pulse 44  Temp(Src) 97.8 F (36.6 C) (Oral)  Ht 5\' 3"  (1.6 m)  Wt 166 lb (75.297 kg)  BMI 29.41 kg/m2  LMP 11/05/1992        Assessment & Plan:  1. Peripheral edema Ok to go back on spirolactone as previously Surveyor, quantity for compression knee hi wear daily Elevate legs when sitting RTO for regular F/U in 1 month  [place], FNP

## 2012-11-05 NOTE — Patient Instructions (Signed)
Peripheral Edema  You have swelling in your legs (peripheral edema). This swelling is due to excess accumulation of salt and water in your body. Edema may be a sign of heart, kidney or liver disease, or a side effect of a medication. It may also be due to problems in the leg veins. Elevating your legs and using special support stockings may be very helpful, if the cause of the swelling is due to poor venous circulation. Avoid long periods of standing, whatever the cause.  Treatment of edema depends on identifying the cause. Chips, pretzels, pickles and other salty foods should be avoided. Restricting salt in your diet is almost always needed. Water pills (diuretics) are often used to remove the excess salt and water from your body via urine. These medicines prevent the kidney from reabsorbing sodium. This increases urine flow.  Diuretic treatment may also result in lowering of potassium levels in your body. Potassium supplements may be needed if you have to use diuretics daily. Daily weights can help you keep track of your progress in clearing your edema. You should call your caregiver for follow up care as recommended.  SEEK IMMEDIATE MEDICAL CARE IF:    You have increased swelling, pain, redness, or heat in your legs.   You develop shortness of breath, especially when lying down.   You develop chest or abdominal pain, weakness, or fainting.   You have a fever.  Document Released: 09/04/2004 Document Revised: 10/20/2011 Document Reviewed: 08/15/2009  Exit[employer] Patient Information 2013 [employer].

## 2012-12-01 DIAGNOSIS — S72009D Fracture of unspecified part of neck of unspecified femur, subsequent encounter for closed fracture with routine healing: Secondary | ICD-10-CM | POA: Diagnosis not present

## 2012-12-01 DIAGNOSIS — IMO0002 Reserved for concepts with insufficient information to code with codable children: Secondary | ICD-10-CM | POA: Diagnosis not present

## 2012-12-07 ENCOUNTER — Encounter: Payer: Self-pay | Admitting: Nurse Practitioner

## 2012-12-07 ENCOUNTER — Ambulatory Visit (INDEPENDENT_AMBULATORY_CARE_PROVIDER_SITE_OTHER): Payer: Medicare Other | Admitting: Nurse Practitioner

## 2012-12-07 VITALS — BP 126/61 | HR 98 | Temp 96.6°F | Ht 64.0 in | Wt 166.0 lb

## 2012-12-07 DIAGNOSIS — R609 Edema, unspecified: Secondary | ICD-10-CM | POA: Diagnosis not present

## 2012-12-07 LAB — COMPLETE METABOLIC PANEL WITH GFR
ALT: 22 U/L (ref 0–35)
AST: 27 U/L (ref 0–37)
Albumin: 4.1 g/dL (ref 3.5–5.2)
Alkaline Phosphatase: 65 U/L (ref 39–117)
BUN: 14 mg/dL (ref 6–23)
CO2: 27 mEq/L (ref 19–32)
Calcium: 9.8 mg/dL (ref 8.4–10.5)
Chloride: 105 mEq/L (ref 96–112)
Creat: 1.14 mg/dL — ABNORMAL HIGH (ref 0.50–1.10)
GFR, Est African American: 56 mL/min — ABNORMAL LOW
GFR, Est Non African American: 48 mL/min — ABNORMAL LOW
Glucose, Bld: 92 mg/dL (ref 70–99)
Potassium: 3.8 mEq/L (ref 3.5–5.3)
Sodium: 142 mEq/L (ref 135–145)
Total Bilirubin: 0.8 mg/dL (ref 0.3–1.2)
Total Protein: 7.1 g/dL (ref 6.0–8.3)

## 2012-12-07 NOTE — Patient Instructions (Signed)
Edema Edema is an abnormal build-up of fluids in tissues. Because this is partly dependent on gravity (water flows to the lowest place), it is more common in the legs and thighs (lower extremities). It is also common in the looser tissues, like around the eyes. Painless swelling of the feet and ankles is common and increases as a person ages. It may affect both legs and may include the calves or even thighs. When squeezed, the fluid may move out of the affected area and may leave a dent for a few moments. CAUSES   Prolonged standing or sitting in one place for extended periods of time. Movement helps pump tissue fluid into the veins, and absence of movement prevents this, resulting in edema.  Varicose veins. The valves in the veins do not work as well as they should. This causes fluid to leak into the tissues.  Fluid and salt overload.  Injury, burn, or surgery to the leg, ankle, or foot, may damage veins and allow fluid to leak out.  Sunburn damages vessels. Leaky vessels allow fluid to go out into the sunburned tissues.  Allergies (from insect bites or stings, medications or chemicals) cause swelling by allowing vessels to become leaky.  Protein in the blood helps keep fluid in your vessels. Low protein, as in malnutrition, allows fluid to leak out.  Hormonal changes, including pregnancy and menstruation, cause fluid retention. This fluid may leak out of vessels and cause edema.  Medications that cause fluid retention. Examples are sex hormones, blood pressure medications, steroid treatment, or anti-depressants.  Some illnesses cause edema, especially heart failure, kidney disease, or liver disease.  Surgery that cuts veins or lymph nodes, such as surgery done for the heart or for breast cancer, may result in edema. DIAGNOSIS  Your caregiver is usually easily able to determine what is causing your swelling (edema) by simply asking what is wrong (getting a history) and examining you (doing  a physical). Sometimes x-rays, EKG (electrocardiogram or heart tracing), and blood work may be done to evaluate for underlying medical illness. TREATMENT  General treatment includes:  Leg elevation (or elevation of the affected body part).  Restriction of fluid intake.  Prevention of fluid overload.  Compression of the affected body part. Compression with elastic bandages or support stockings squeezes the tissues, preventing fluid from entering and forcing it back into the blood vessels.  Diuretics (also called water pills or fluid pills) pull fluid out of your body in the form of increased urination. These are effective in reducing the swelling, but can have side effects and must be used only under your caregiver's supervision. Diuretics are appropriate only for some types of edema. The specific treatment can be directed at any underlying causes discovered. Heart, liver, or kidney disease should be treated appropriately. HOME CARE INSTRUCTIONS   Elevate the legs (or affected body part) above the level of the heart, while lying down.  Avoid sitting or standing still for prolonged periods of time.  Avoid putting anything directly under the knees when lying down, and do not wear constricting clothing or garters on the upper legs.  Exercising the legs causes the fluid to work back into the veins and lymphatic channels. This may help the swelling go down.  The pressure applied by elastic bandages or support stockings can help reduce ankle swelling.  A low-salt diet may help reduce fluid retention and decrease the ankle swelling.  Take any medications exactly as prescribed. SEEK MEDICAL CARE IF:  Your edema is   not responding to recommended treatments. SEEK IMMEDIATE MEDICAL CARE IF:   You develop shortness of breath or chest pain.  You cannot breathe when you lay down; or if, while lying down, you have to get up and go to the window to get your breath.  You are having increasing  swelling without relief from treatment.  You develop a fever over 102 F (38.9 C).  You develop pain or redness in the areas that are swollen.  Tell your caregiver right away if you have gained 3 lb/1.4 kg in 1 day or 5 lb/2.3 kg in a week. MAKE SURE YOU:   Understand these instructions.  Will watch your condition.  Will get help right away if you are not doing well or get worse. Document Released: 07/28/2005 Document Revised: 01/27/2012 Document Reviewed: 03/15/2008 [employer] Patient Information 2013 [employer].  

## 2012-12-07 NOTE — Progress Notes (Signed)
  Subjective:    Patient ID: Brooke Chavez, female    DOB: 16-Oct-1940, 72 y.o.   MRN: [id number]  HPI  Patient was seen one month ago with peripheral edema. At that time she was not taking her spirolactone. We put her back o her spriolactone and patient is back for follow-up. Doing better. Edema has resolved. Wears her knee high compression stockings daily.    Review of Systems  All other systems reviewed and are negative.       Objective:   Physical Exam  Constitutional: She appears well-developed and well-nourished.  Cardiovascular: Normal rate and normal heart sounds.   Pulmonary/Chest: Effort normal and breath sounds normal.  Musculoskeletal: She exhibits edema (mild bilateral ankles- Left slightly more swoeen then right.).  Skin: Skin is warm and dry.   BP 126/61  Pulse 98  Temp(Src) 96.6 F (35.9 C) (Oral)  Ht 5\' 4"  (1.626 m)  Wt 166 lb (75.297 kg)  BMI 28.48 kg/m2  LMP 11/05/1992        Assessment & Plan:  1. Peripheral edema Continue spirolatone as rx Elevate legs when sitting Wear knee high compression hoe daily especially when on feet a lot RTO in 3 months F/U  Mary-Margaret Hassell Done, FNP  - COMPLETE METABOLIC PANEL WITH GFR

## 2012-12-30 ENCOUNTER — Other Ambulatory Visit: Payer: Self-pay | Admitting: Nurse Practitioner

## 2012-12-30 ENCOUNTER — Telehealth: Payer: Self-pay | Admitting: Nurse Practitioner

## 2012-12-30 NOTE — Telephone Encounter (Signed)
PT AWARE TO PLEASE CONTACT PHARMACY.

## 2013-01-03 ENCOUNTER — Other Ambulatory Visit: Payer: Self-pay | Admitting: Family Medicine

## 2013-01-04 ENCOUNTER — Ambulatory Visit (INDEPENDENT_AMBULATORY_CARE_PROVIDER_SITE_OTHER): Payer: Medicare Other | Admitting: Nurse Practitioner

## 2013-01-04 ENCOUNTER — Other Ambulatory Visit: Payer: Self-pay | Admitting: *Deleted

## 2013-01-04 ENCOUNTER — Ambulatory Visit (INDEPENDENT_AMBULATORY_CARE_PROVIDER_SITE_OTHER): Payer: Medicare Other

## 2013-01-04 ENCOUNTER — Encounter: Payer: Self-pay | Admitting: Nurse Practitioner

## 2013-01-04 VITALS — BP 142/62 | HR 70 | Temp 97.7°F | Ht 63.0 in | Wt 166.0 lb

## 2013-01-04 DIAGNOSIS — M545 Low back pain, unspecified: Secondary | ICD-10-CM

## 2013-01-04 DIAGNOSIS — S32009A Unspecified fracture of unspecified lumbar vertebra, initial encounter for closed fracture: Secondary | ICD-10-CM | POA: Diagnosis not present

## 2013-01-04 DIAGNOSIS — S32000A Wedge compression fracture of unspecified lumbar vertebra, initial encounter for closed fracture: Secondary | ICD-10-CM

## 2013-01-04 MED ORDER — TRAMADOL HCL 50 MG PO TABS: 50.0000 mg | ORAL_TABLET | Freq: Three times a day (TID) | ORAL | Status: DC | PRN

## 2013-01-04 MED ORDER — VALSARTAN-HYDROCHLOROTHIAZIDE 80-12.5 MG PO TABS: 1.0000 | ORAL_TABLET | Freq: Every day | ORAL | Status: DC

## 2013-01-04 NOTE — Progress Notes (Signed)
  Subjective:    Patient ID: Brooke Chavez, female    DOB: October 28, 1940, 71 y.o.   MRN: [id number]  HPI- Patient was at home Friday and fell- Said that she tripped over her flower bed and landed on her butt on driveway. Sharp pains in lower back with certain movement- Sharp pains when she goes to bed at night. Patient has taken some tylenol which has helped some- Heat also helps some.    Review of Systems  Musculoskeletal: Positive for myalgias and back pain. Negative for gait problem.  Neurological: Negative for tremors, weakness and numbness.  All other systems reviewed and are negative.       Objective:   Physical Exam  Constitutional: She appears well-developed and well-nourished.  Cardiovascular: Normal rate and normal heart sounds.   Pulmonary/Chest: Effort normal and breath sounds normal.  Musculoskeletal: Normal range of motion.  Point tenderness low mid back on palpation. Motor strength and sensation distally intact (-) SLR bil  Neurological: She has normal reflexes.  Lumbar X-ray Compression fracture lumbar spine- Preliminary reading by Ronnald Collum, FNP  [facility]  BP 142/62  Pulse 70  Temp(Src) 97.7 F (36.5 C) (Oral)  Ht 5\' 3"  (1.6 m)  Wt 166 lb (75.297 kg)  BMI 29.41 kg/m2  LMP 11/05/1992        Assessment & Plan:  Compression fracture lumbar spine  Patient wants to take pain meds and wait on seeing radiologist for vertebroplasty  Ultram 50mg  1 po TID prn  Rest  No heavy lifting, bending or stooping  Mary-Margaret Hassell Done, FNP

## 2013-01-04 NOTE — Patient Instructions (Signed)
Vertebral Fracture  You have a fracture of one or more vertebra. These are the bony parts that form the spine. Minor vertebral fractures happen when people fall. Osteoporosis is associated with many of these fractures. Hospital care may not be necessary for minor compression fractures that are stable. However, multiple fractures of the spine or unstable injuries can cause severe pain and even damage the spinal cord. A spinal cord injury may cause paralysis, numbness, or loss of normal bowel and bladder control.   Normally there is pain and stiffness in the back for 3 to 6 weeks after a vertebral fracture. Bed rest for several days, pain medicine, and a slow return to activity is often the only treatment that is needed depending on the location of the fracture. Neck and back braces may be helpful in reducing pain and increasing mobility. When your pain allows, you should begin walking or swimming to help maintain your endurance. Exercises to improve motion and to strengthen the back may also be useful after the initial pain improves. Treatment for osteoporosis may be essential for full recovery. This will help reduce your risk of vertebral fractures with a future fall.  During the first few days after a spine fracture you may feel nauseated or vomit. If this is severe, hospital care with IV fluids will be needed.   Arrange for follow-up care as recommended to assure proper long-term care and prevention of further spine injury.   SEEK IMMEDIATE MEDICAL CARE IF:   You have increasing pain, vomiting, or are unable to move around at all.   You develop numbness, tingling, weakness, or paralysis of any part of your body.   You develop a loss of normal bowel or bladder control.   You have difficulty breathing, cough, fever, chest or abdominal pain.  MAKE SURE YOU:    Understand these instructions.   Will watch your condition.   Will get help right away if you are not doing well or get worse.  Document Released:  09/04/2004 Document Revised: 10/20/2011 Document Reviewed: 03/20/2009  Exit[employer] Patient Information 2014 [employer].

## 2013-01-24 ENCOUNTER — Telehealth: Payer: Self-pay | Admitting: Nurse Practitioner

## 2013-01-24 NOTE — Telephone Encounter (Signed)
appt made

## 2013-01-25 ENCOUNTER — Encounter: Payer: Self-pay | Admitting: Family Medicine

## 2013-01-25 ENCOUNTER — Ambulatory Visit (INDEPENDENT_AMBULATORY_CARE_PROVIDER_SITE_OTHER): Payer: Medicare Other | Admitting: Family Medicine

## 2013-01-25 VITALS — BP 135/73 | HR 91 | Temp 97.7°F | Ht 62.0 in | Wt 159.2 lb

## 2013-01-25 DIAGNOSIS — M549 Dorsalgia, unspecified: Secondary | ICD-10-CM

## 2013-01-25 DIAGNOSIS — R059 Cough, unspecified: Secondary | ICD-10-CM | POA: Diagnosis not present

## 2013-01-25 DIAGNOSIS — R05 Cough: Secondary | ICD-10-CM

## 2013-01-25 DIAGNOSIS — L259 Unspecified contact dermatitis, unspecified cause: Secondary | ICD-10-CM

## 2013-01-25 MED ORDER — BENZONATATE 100 MG PO CAPS: 100.0000 mg | ORAL_CAPSULE | Freq: Two times a day (BID) | ORAL | Status: DC | PRN

## 2013-01-25 MED ORDER — AZITHROMYCIN 250 MG PO TABS: ORAL_TABLET | ORAL | Status: DC

## 2013-01-25 MED ORDER — METHYLPREDNISOLONE 4 MG PO KIT: PACK | ORAL | Status: DC

## 2013-01-25 NOTE — Progress Notes (Signed)
  Subjective:    Patient ID: Brooke Chavez, female    DOB: 05/04/1941, 72 y.o.   MRN: [id number]  HPI This 72 y.o. female presents for evaluation of cough, URI symptoms for over a week, back pain, and rash on lower extremities and back.  She states she has been taking tylenol for her back which is helping but when she coughs it grabs had the pain is severe.  She has been seen for this a week ago and was tx'd with tramadol which she states she doesn't like to take because it makes her sleepy.  She has developed rash on her left leg and her back which itches and cannot remember any specific allergens or etiology of the rash.  She states she has had cough and congestion for over a week.    Review of Systems  Constitutional: Positive for fatigue. Negative for fever, chills, diaphoresis, activity change, appetite change and unexpected weight change.  HENT: Positive for congestion, rhinorrhea, sneezing and postnasal drip. Negative for hearing loss, ear pain, facial swelling, drooling, mouth sores, neck pain, neck stiffness, dental problem, sinus pressure and ear discharge.   Eyes: Negative.   Respiratory: Positive for cough. Negative for choking, chest tightness, shortness of breath, wheezing and stridor.   Cardiovascular: Negative.   Gastrointestinal: Negative.        Objective:   Physical Exam Vital signs noted  Well developed well nourished female.  HEENT - Head atraumatic Normocephalic                Eyes - PERRLA, Conjuctiva - clear Sclera- Clear EOMI                Ears - EAC's Wnl TM's Wnl Gross Hearing WNL                Nose - Nares patent                 Throat - oropharanx wnl Respiratory - Lungs CTA bilateral Cardiac - RRR S1 and S2 without murmur GI - Abdomen soft Nontender and bowel sounds active x 4 Extremities - Rash left lower extremity. Neuro - Grossly intact. MS - TTP bilateral LS spine.      Assessment & Plan:  Back pain - Plan: methylPREDNISolone (MEDROL, PAK,) 4  MG tablet  Cough - Plan: methylPREDNISolone (MEDROL, PAK,) 4 MG tablet, azithromycin (ZITHROMAX) 250 MG tablet, benzonatate (TESSALON) 100 MG capsule  Contact dermatitis - Plan: methylPREDNISolone (MEDROL, PAK,) 4 MG tablet Continue tramadol prn for pain and tylenol otc as directed. Follow up prn if sx's persist or continue

## 2013-01-25 NOTE — Patient Instructions (Signed)
Back Pain, Adult  Low back pain is very common. About 1 in 5 people have back pain. The cause of low back pain is rarely dangerous. The pain often gets better over time. About half of people with a sudden onset of back pain feel better in just 2 weeks. About 8 in 10 people feel better by 6 weeks.   CAUSES  Some common causes of back pain include:  · Strain of the muscles or ligaments supporting the spine.  · Wear and tear (degeneration) of the spinal discs.  · Arthritis.  · Direct injury to the back.  DIAGNOSIS  Most of the time, the direct cause of low back pain is not known. However, back pain can be treated effectively even when the exact cause of the pain is unknown. Answering your caregiver's questions about your overall health and symptoms is one of the most accurate ways to make sure the cause of your pain is not dangerous. If your caregiver needs more information, he or she may order lab work or imaging tests (X-rays or MRIs). However, even if imaging tests show changes in your back, this usually does not require surgery.  HOME CARE INSTRUCTIONS  For many people, back pain returns. Since low back pain is rarely dangerous, it is often a condition that people can learn to manage on their own.   · Remain active. It is stressful on the back to sit or stand in one place. Do not sit, drive, or stand in one place for more than 30 minutes at a time. Take short walks on level surfaces as soon as pain allows. Try to increase the length of time you walk each day.  · Do not stay in bed. Resting more than 1 or 2 days can delay your recovery.  · Do not avoid exercise or work. Your body is made to move. It is not dangerous to be active, even though your back may hurt. Your back will likely heal faster if you return to being active before your pain is gone.  · Pay attention to your body when you  bend and lift. Many people have less discomfort when lifting if they bend their knees, keep the load close to their bodies, and  avoid twisting. Often, the most comfortable positions are those that put less stress on your recovering back.  · Find a comfortable position to sleep. Use a firm mattress and lie on your side with your knees slightly bent. If you lie on your back, put a pillow under your knees.  · Only take over-the-counter or prescription medicines as directed by your caregiver. Over-the-counter medicines to reduce pain and inflammation are often the most helpful. Your caregiver may prescribe muscle relaxant drugs. These medicines help dull your pain so you can more quickly return to your normal activities and healthy exercise.  · Put ice on the injured area.  · Put ice in a plastic bag.  · Place a towel between your skin and the bag.  · Leave the ice on for 15-20 minutes, 3-4 times a day for the first 2 to 3 days. After that, ice and heat may be alternated to reduce pain and spasms.  · Ask your caregiver about trying back exercises and gentle massage. This may be of some benefit.  · Avoid feeling anxious or stressed. Stress increases muscle tension and can worsen back pain. It is important to recognize when you are anxious or stressed and learn ways to manage it. Exercise is a great option.  SEEK MEDICAL CARE IF:  · You have pain that is not relieved with rest or   medicine.  · You have pain that does not improve in 1 week.  · You have new symptoms.  · You are generally not feeling well.  SEEK IMMEDIATE MEDICAL CARE IF:   · You have pain that radiates from your back into your legs.  · You develop new bowel or bladder control problems.  · You have unusual weakness or numbness in your arms or legs.  · You develop nausea or vomiting.  · You develop abdominal pain.  · You feel faint.  Document Released: 07/28/2005 Document Revised: 01/27/2012 Document Reviewed: 12/16/2010  [employer]® Patient Information ©2014 [employer].

## 2013-03-02 ENCOUNTER — Other Ambulatory Visit: Payer: Self-pay | Admitting: Nurse Practitioner

## 2013-03-30 ENCOUNTER — Other Ambulatory Visit: Payer: Self-pay | Admitting: Nurse Practitioner

## 2013-03-31 NOTE — Telephone Encounter (Signed)
Last lipids 1/14  MMM

## 2013-04-13 ENCOUNTER — Other Ambulatory Visit: Payer: Self-pay | Admitting: Nurse Practitioner

## 2013-04-19 ENCOUNTER — Other Ambulatory Visit: Payer: Self-pay | Admitting: Nurse Practitioner

## 2013-04-21 DIAGNOSIS — H2510 Age-related nuclear cataract, unspecified eye: Secondary | ICD-10-CM | POA: Diagnosis not present

## 2013-05-17 ENCOUNTER — Ambulatory Visit: Payer: Medicare Other | Admitting: Nurse Practitioner

## 2013-05-25 DIAGNOSIS — M1990 Unspecified osteoarthritis, unspecified site: Secondary | ICD-10-CM | POA: Diagnosis not present

## 2013-05-25 DIAGNOSIS — IMO0002 Reserved for concepts with insufficient information to code with codable children: Secondary | ICD-10-CM | POA: Diagnosis not present

## 2013-05-25 DIAGNOSIS — M899 Disorder of bone, unspecified: Secondary | ICD-10-CM | POA: Diagnosis not present

## 2013-05-25 DIAGNOSIS — S72143A Displaced intertrochanteric fracture of unspecified femur, initial encounter for closed fracture: Secondary | ICD-10-CM | POA: Diagnosis not present

## 2013-05-25 DIAGNOSIS — S72009D Fracture of unspecified part of neck of unspecified femur, subsequent encounter for closed fracture with routine healing: Secondary | ICD-10-CM | POA: Diagnosis not present

## 2013-06-02 ENCOUNTER — Other Ambulatory Visit: Payer: Self-pay | Admitting: Nurse Practitioner

## 2013-06-02 ENCOUNTER — Ambulatory Visit (INDEPENDENT_AMBULATORY_CARE_PROVIDER_SITE_OTHER): Payer: Medicare Other | Admitting: Nurse Practitioner

## 2013-06-02 ENCOUNTER — Encounter: Payer: Self-pay | Admitting: Nurse Practitioner

## 2013-06-02 VITALS — BP 127/84 | HR 90 | Temp 97.0°F | Ht 62.0 in | Wt 164.0 lb

## 2013-06-02 DIAGNOSIS — E785 Hyperlipidemia, unspecified: Secondary | ICD-10-CM

## 2013-06-02 DIAGNOSIS — I10 Essential (primary) hypertension: Secondary | ICD-10-CM | POA: Diagnosis not present

## 2013-06-02 MED ORDER — ATORVASTATIN CALCIUM 40 MG PO TABS: 40.0000 mg | ORAL_TABLET | Freq: Every day | ORAL | Status: DC

## 2013-06-02 MED ORDER — SPIRONOLACTONE 25 MG PO TABS: 25.0000 mg | ORAL_TABLET | Freq: Every day | ORAL | Status: DC

## 2013-06-02 MED ORDER — VALSARTAN-HYDROCHLOROTHIAZIDE 80-12.5 MG PO TABS: 1.0000 | ORAL_TABLET | Freq: Every day | ORAL | Status: DC

## 2013-06-02 MED ORDER — ALENDRONATE SODIUM 70 MG PO TABS: 70.0000 mg | ORAL_TABLET | ORAL | Status: DC

## 2013-06-02 MED ORDER — AMLODIPINE BESYLATE 10 MG PO TABS: 10.0000 mg | ORAL_TABLET | Freq: Every day | ORAL | Status: DC

## 2013-06-02 NOTE — Progress Notes (Signed)
Subjective:    Patient ID: Brooke Chavez, female    DOB: 1941-01-10, 72 y.o.   MRN: [id number]  Hypertension This is a chronic problem. The current episode started more than 1 year ago. The problem has been waxing and waning since onset. The problem is controlled. Pertinent negatives include no anxiety, blurred vision, chest pain, palpitations, peripheral edema or shortness of breath. Risk factors for coronary artery disease include dyslipidemia and post-menopausal state. Past treatments include diuretics, calcium channel blockers and angiotensin blockers. The current treatment provides moderate improvement. There is no history of a thyroid problem. There is no history of sleep apnea.  Hyperlipidemia This is a chronic problem. The current episode started more than 1 year ago. The problem is controlled. Recent lipid tests were reviewed and are normal. She has no history of diabetes or obesity. Factors aggravating her hyperlipidemia include fatty foods. Pertinent negatives include no chest pain or shortness of breath. Current antihyperlipidemic treatment includes statins. The current treatment provides moderate improvement of lipids. Risk factors for coronary artery disease include post-menopausal, hypertension and dyslipidemia.  Osteoporosis Pt takes Fosamax weekly-Pt states last dexascan was within the last year.     Review of Systems  Eyes: Negative for blurred vision.  Respiratory: Negative for shortness of breath.   Cardiovascular: Negative for chest pain and palpitations.  All other systems reviewed and are negative.       Objective:   Physical Exam  Vitals reviewed. Constitutional: She is oriented to person, place, and time. She appears well-developed and well-nourished.  HENT:  Head: Normocephalic.  Right Ear: External ear normal.  Left Ear: External ear normal.  Mouth/Throat: Oropharynx is clear and moist.  Eyes: Pupils are equal, round, and reactive to light.  Neck: Normal  range of motion. Neck supple.  Cardiovascular: Normal rate, normal heart sounds and intact distal pulses.   No murmur heard. Irregular regular heart rate   Pulmonary/Chest: Effort normal and breath sounds normal.  Abdominal: Soft. Bowel sounds are normal.  Musculoskeletal: Normal range of motion.  Neurological: She is alert and oriented to person, place, and time.  Skin: Skin is warm and dry.  Psychiatric: She has a normal mood and affect. Her behavior is normal. Judgment and thought content normal.    BP 127/84  Pulse 90  Temp(Src) 97 F (36.1 C) (Oral)  Ht 5\' 2"  (1.575 m)  Wt 164 lb (74.39 kg)  BMI 29.99 kg/m2  LMP 11/05/1992       Assessment & Plan:   1. HYPERTENSION   2. HYPERLIPIDEMIA    Orders Placed This Encounter  Procedures  . NMR, lipoprofile  . CMP14+EGFR   Meds ordered this encounter  Medications  . alendronate (FOSAMAX) 70 MG tablet    Sig: Take 1 tablet (70 mg total) by mouth every 7 (seven) days.    Dispense:  4 tablet    Refill:  3  . amLODipine (NORVASC) 10 MG tablet    Sig: Take 1 tablet (10 mg total) by mouth daily.    Dispense:  30 tablet    Refill:  3  . atorvastatin (LIPITOR) 40 MG tablet    Sig: Take 1 tablet (40 mg total) by mouth daily.    Dispense:  30 tablet    Refill:  3    NTBS  . spironolactone (ALDACTONE) 25 MG tablet    Sig: Take 1 tablet (25 mg total) by mouth daily.    Dispense:  30 tablet    Refill:  3  .  valsartan-hydrochlorothiazide (DIOVAN-HCT) 80-12.5 MG per tablet    Sig: Take 1 tablet by mouth daily.    Dispense:  30 tablet    Refill:  4    Continue all meds Labs pending Diet and exercise encouraged Health maintenance reviewed Follow up in 3 months  [place], FNP

## 2013-06-02 NOTE — Patient Instructions (Signed)

## 2013-06-03 LAB — CMP14+EGFR
ALT: 25 IU/L (ref 0–32)
AST: 33 IU/L (ref 0–40)
Albumin/Globulin Ratio: 1.8 (ref 1.1–2.5)
Albumin: 4.2 g/dL (ref 3.5–4.8)
Alkaline Phosphatase: 64 IU/L (ref 39–117)
BUN/Creatinine Ratio: 17 (ref 11–26)
BUN: 22 mg/dL (ref 8–27)
CO2: 21 mmol/L (ref 18–29)
Calcium: 9.4 mg/dL (ref 8.6–10.2)
Chloride: 102 mmol/L (ref 97–108)
Creatinine, Ser: 1.29 mg/dL — ABNORMAL HIGH (ref 0.57–1.00)
GFR calc Af Amer: 48 mL/min/{1.73_m2} — ABNORMAL LOW (ref >59)
GFR calc non Af Amer: 41 mL/min/{1.73_m2} — ABNORMAL LOW (ref >59)
Globulin, Total: 2.4 g/dL (ref 1.5–4.5)
Glucose: 80 mg/dL (ref 65–99)
Potassium: 4.9 mmol/L (ref 3.5–5.2)
Sodium: 141 mmol/L (ref 134–144)
Total Bilirubin: 0.4 mg/dL (ref 0.0–1.2)
Total Protein: 6.6 g/dL (ref 6.0–8.5)

## 2013-06-07 LAB — NMR, LIPOPROFILE
Cholesterol: 183 mg/dL (ref <200)
HDL Cholesterol by NMR: 61 mg/dL (ref >=40)
HDL Particle Number: 34.3 umol/L (ref >=30.5)
LDL Particle Number: 1082 nmol/L — ABNORMAL HIGH (ref <1000)
LDL Size: 21.2 nm (ref >20.5)
LDLC SERPL CALC-MCNC: 104 mg/dL — ABNORMAL HIGH (ref <100)
LP-IR Score: <25 (ref <=45)
Small LDL Particle Number: 316 nmol/L (ref <=527)
Triglycerides by NMR: 90 mg/dL (ref <150)

## 2013-11-04 ENCOUNTER — Other Ambulatory Visit: Payer: Self-pay | Admitting: *Deleted

## 2013-11-04 MED ORDER — AMLODIPINE BESYLATE 10 MG PO TABS: 10.0000 mg | ORAL_TABLET | Freq: Every day | ORAL | Status: DC

## 2013-11-04 MED ORDER — VALSARTAN-HYDROCHLOROTHIAZIDE 80-12.5 MG PO TABS: 1.0000 | ORAL_TABLET | Freq: Every day | ORAL | Status: DC

## 2013-11-08 ENCOUNTER — Telehealth: Payer: Self-pay | Admitting: Nurse Practitioner

## 2013-11-08 NOTE — Telephone Encounter (Signed)
Discussed schedule with patient and appt made for 11/21/13 at 8:00am.

## 2013-11-21 ENCOUNTER — Ambulatory Visit (INDEPENDENT_AMBULATORY_CARE_PROVIDER_SITE_OTHER): Payer: Medicare Other | Admitting: Nurse Practitioner

## 2013-11-21 ENCOUNTER — Encounter: Payer: Self-pay | Admitting: Nurse Practitioner

## 2013-11-21 VITALS — BP 128/64 | HR 68 | Temp 97.0°F | Ht 62.0 in | Wt 172.0 lb

## 2013-11-21 DIAGNOSIS — E663 Overweight: Secondary | ICD-10-CM

## 2013-11-21 DIAGNOSIS — M810 Age-related osteoporosis without current pathological fracture: Secondary | ICD-10-CM

## 2013-11-21 DIAGNOSIS — E785 Hyperlipidemia, unspecified: Secondary | ICD-10-CM

## 2013-11-21 DIAGNOSIS — I10 Essential (primary) hypertension: Secondary | ICD-10-CM | POA: Diagnosis not present

## 2013-11-21 DIAGNOSIS — R1013 Epigastric pain: Secondary | ICD-10-CM

## 2013-11-21 DIAGNOSIS — I498 Other specified cardiac arrhythmias: Secondary | ICD-10-CM

## 2013-11-21 DIAGNOSIS — K3189 Other diseases of stomach and duodenum: Secondary | ICD-10-CM

## 2013-11-21 MED ORDER — ALENDRONATE SODIUM 70 MG PO TABS: 70.0000 mg | ORAL_TABLET | ORAL | Status: DC

## 2013-11-21 MED ORDER — SPIRONOLACTONE 25 MG PO TABS: 25.0000 mg | ORAL_TABLET | Freq: Every day | ORAL | Status: DC

## 2013-11-21 MED ORDER — AMLODIPINE BESYLATE 10 MG PO TABS: 10.0000 mg | ORAL_TABLET | Freq: Every day | ORAL | Status: DC

## 2013-11-21 MED ORDER — VALSARTAN-HYDROCHLOROTHIAZIDE 80-12.5 MG PO TABS: 1.0000 | ORAL_TABLET | Freq: Every day | ORAL | Status: DC

## 2013-11-21 MED ORDER — ATORVASTATIN CALCIUM 40 MG PO TABS: 40.0000 mg | ORAL_TABLET | Freq: Every day | ORAL | Status: DC

## 2013-11-21 NOTE — Patient Instructions (Signed)

## 2013-11-21 NOTE — Progress Notes (Signed)
Subjective:    Patient ID: Brooke Chavez, female    DOB: [birth year], 73 y.o.   MRN: [id number]  Patient here today for follow up of chronic medical problems.  Hypertension This is a chronic problem. The current episode started more than 1 year ago. The problem has been waxing and waning since onset. The problem is controlled. Pertinent negatives include no anxiety, blurred vision, chest pain, palpitations, peripheral edema or shortness of breath. Risk factors for coronary artery disease include dyslipidemia and post-menopausal state. Past treatments include diuretics, calcium channel blockers and angiotensin blockers. The current treatment provides moderate improvement. There is no history of a thyroid problem. There is no history of sleep apnea.  Hyperlipidemia This is a chronic problem. The current episode started more than 1 year ago. The problem is controlled. Recent lipid tests were reviewed and are normal. She has no history of diabetes or obesity. Factors aggravating her hyperlipidemia include fatty foods. Pertinent negatives include no chest pain or shortness of breath. Current antihyperlipidemic treatment includes statins. The current treatment provides moderate improvement of lipids. Risk factors for coronary artery disease include post-menopausal, hypertension and dyslipidemia.  Osteoporosis Pt takes Fosamax weekly-Pt states last dexascan was within the last year.     Review of Systems  Eyes: Negative for blurred vision.  Respiratory: Negative for shortness of breath.   Cardiovascular: Negative for chest pain and palpitations.  All other systems reviewed and are negative.      Objective:   Physical Exam  Vitals reviewed. Constitutional: She is oriented to person, place, and time. She appears well-developed and well-nourished.  HENT:  Head: Normocephalic.  Right Ear: External ear normal.  Left Ear: External ear normal.  Mouth/Throat: Oropharynx is clear and moist.  Eyes:  Pupils are equal, round, and reactive to light.  Neck: Normal range of motion. Neck supple.  Cardiovascular: Normal rate, normal heart sounds and intact distal pulses.   No murmur heard. Irregular regular heart rate   Pulmonary/Chest: Effort normal and breath sounds normal.  Abdominal: Soft. Bowel sounds are normal.  Musculoskeletal: Normal range of motion.  Neurological: She is alert and oriented to person, place, and time.  Skin: Skin is warm and dry.  Psychiatric: She has a normal mood and affect. Her behavior is normal. Judgment and thought content normal.    BP 128/64  Pulse 68  Temp(Src) 97 F (36.1 C) (Oral)  Ht 5' 2"  (1.575 m)  Wt 172 lb (78.019 kg)  BMI 31.45 kg/m2  LMP 11/05/1992       Assessment & Plan:    1. Overweight   2. HYPERTENSION   3. HYPERLIPIDEMIA   4. Dyspepsia   5. BRADYCARDIA   6. Osteoporosis, unspecified    Orders Placed This Encounter  Procedures  . CMP14+EGFR  . NMR, lipoprofile   Meds ordered this encounter  Medications  . alendronate (FOSAMAX) 70 MG tablet    Sig: Take 1 tablet (70 mg total) by mouth every 7 (seven) days.    Dispense:  4 tablet    Refill:  5    Order Specific Question:  Supervising Provider    Answer:  Chipper Herb [[id number]]  . amLODipine (NORVASC) 10 MG tablet    Sig: Take 1 tablet (10 mg total) by mouth daily.    Dispense:  30 tablet    Refill:  5    Order Specific Question:  Supervising Provider    Answer:  Chipper Herb [[id number]]  . atorvastatin (LIPITOR) 40 MG  tablet    Sig: Take 1 tablet (40 mg total) by mouth daily.    Dispense:  30 tablet    Refill:  5    Order Specific Question:  Supervising Provider    Answer:  Chipper Herb [[id number]]  . spironolactone (ALDACTONE) 25 MG tablet    Sig: Take 1 tablet (25 mg total) by mouth daily.    Dispense:  30 tablet    Refill:  5    Order Specific Question:  Supervising Provider    Answer:  Chipper Herb [[id number]]  . valsartan-hydrochlorothiazide (DIOVAN-HCT)  80-12.5 MG per tablet    Sig: Take 1 tablet by mouth daily.    Dispense:  30 tablet    Refill:  5    Order Specific Question:  Supervising Provider    Answer:  Chipper Herb [[id number]]   Patient to schedule mammogram Labs pending Health maintenance reviewed Diet and exercise encouraged Continue all meds Follow up  In 3 months   [place], FNP

## 2013-11-22 LAB — CMP14+EGFR
ALT: 14 IU/L (ref 0–32)
AST: 27 IU/L (ref 0–40)
Albumin/Globulin Ratio: 1.8 (ref 1.1–2.5)
Albumin: 4.1 g/dL (ref 3.5–4.8)
Alkaline Phosphatase: 63 IU/L (ref 39–117)
BUN/Creatinine Ratio: 16 (ref 11–26)
BUN: 22 mg/dL (ref 8–27)
CO2: 21 mmol/L (ref 18–29)
Calcium: 9.1 mg/dL (ref 8.7–10.3)
Chloride: 102 mmol/L (ref 97–108)
Creatinine, Ser: 1.37 mg/dL — ABNORMAL HIGH (ref 0.57–1.00)
GFR calc Af Amer: 44 mL/min/{1.73_m2} — ABNORMAL LOW (ref >59)
GFR calc non Af Amer: 38 mL/min/{1.73_m2} — ABNORMAL LOW (ref >59)
Globulin, Total: 2.3 g/dL (ref 1.5–4.5)
Glucose: 83 mg/dL (ref 65–99)
Potassium: 4.2 mmol/L (ref 3.5–5.2)
Sodium: 141 mmol/L (ref 134–144)
Total Bilirubin: 0.4 mg/dL (ref 0.0–1.2)
Total Protein: 6.4 g/dL (ref 6.0–8.5)

## 2013-11-22 LAB — NMR, LIPOPROFILE
Cholesterol: 161 mg/dL (ref <200)
HDL Cholesterol by NMR: 62 mg/dL (ref >=40)
HDL Particle Number: 33.9 umol/L (ref >=30.5)
LDL Particle Number: 955 nmol/L (ref <1000)
LDL Size: 20.9 nm (ref >20.5)
LDLC SERPL CALC-MCNC: 83 mg/dL (ref <100)
LP-IR Score: 33 (ref <=45)
Small LDL Particle Number: 311 nmol/L (ref <=527)
Triglycerides by NMR: 80 mg/dL (ref <150)

## 2014-04-21 ENCOUNTER — Encounter: Payer: Self-pay | Admitting: Gastroenterology

## 2014-05-16 ENCOUNTER — Telehealth: Payer: Self-pay | Admitting: Nurse Practitioner

## 2014-05-16 DIAGNOSIS — E785 Hyperlipidemia, unspecified: Secondary | ICD-10-CM

## 2014-05-16 DIAGNOSIS — I10 Essential (primary) hypertension: Secondary | ICD-10-CM

## 2014-05-16 DIAGNOSIS — M810 Age-related osteoporosis without current pathological fracture: Secondary | ICD-10-CM

## 2014-05-16 MED ORDER — AMLODIPINE BESYLATE 10 MG PO TABS: 10.0000 mg | ORAL_TABLET | Freq: Every day | ORAL | Status: DC

## 2014-05-16 MED ORDER — VALSARTAN-HYDROCHLOROTHIAZIDE 80-12.5 MG PO TABS: 1.0000 | ORAL_TABLET | Freq: Every day | ORAL | Status: DC

## 2014-05-16 MED ORDER — ATORVASTATIN CALCIUM 40 MG PO TABS: 40.0000 mg | ORAL_TABLET | Freq: Every day | ORAL | Status: DC

## 2014-05-16 MED ORDER — SPIRONOLACTONE 25 MG PO TABS: 25.0000 mg | ORAL_TABLET | Freq: Every day | ORAL | Status: DC

## 2014-05-16 NOTE — Telephone Encounter (Signed)
Patient aware.

## 2014-05-16 NOTE — Telephone Encounter (Signed)
rx sent to pharamcy 

## 2014-05-23 ENCOUNTER — Encounter: Payer: Self-pay | Admitting: Cardiology

## 2014-05-23 ENCOUNTER — Ambulatory Visit (INDEPENDENT_AMBULATORY_CARE_PROVIDER_SITE_OTHER): Payer: Medicare Other | Admitting: Cardiology

## 2014-05-23 VITALS — BP 153/68 | HR 67 | Ht 63.0 in | Wt 171.0 lb

## 2014-05-23 DIAGNOSIS — I4892 Unspecified atrial flutter: Secondary | ICD-10-CM

## 2014-05-23 DIAGNOSIS — I480 Paroxysmal atrial fibrillation: Secondary | ICD-10-CM | POA: Diagnosis not present

## 2014-05-23 DIAGNOSIS — I340 Nonrheumatic mitral (valve) insufficiency: Secondary | ICD-10-CM | POA: Diagnosis not present

## 2014-05-23 NOTE — Patient Instructions (Signed)
Your physician recommends that you schedule a follow-up appointment in: one year with Dr. Percival Spanish  We are ordering an Echo

## 2014-05-23 NOTE — Progress Notes (Signed)
HPI The patient presents for follow up of atrial flutter.   She is s/p ablation and redo ablation. When I last saw her she had junctional bradycardia. I had her wear a monitor. She had some paroxysms of probable fibrillation though these were very brief period.  She had lots of atrial ectopy and runs of atrial tachycardia. However, she had no sustained tachyarrhythmias. She had some bradycardic episodes but no sustained bradycardia arrhythmias. There were some junctional beats.   She has not been followed at the last couple of years. However, she says she's been doing well. She rarely feels any palpitations. She has not had any presyncope or syncope. She has not had any shortness of breath, PND or orthopnea. She has no weight gain or edema. She tries to stay active though she's not exercising routinely.   Allergies  Allergen Reactions  . Aspirin Nausea And Vomiting    Current Outpatient Prescriptions  Medication Sig Dispense Refill  . acetaminophen (TYLENOL) 325 MG tablet Take 325 mg by mouth every 6 (six) hours as needed. For pain         . alendronate (FOSAMAX) 70 MG tablet Take 1 tablet (70 mg total) by mouth every 7 (seven) days.  4 tablet  5  . amLODipine (NORVASC) 10 MG tablet Take 1 tablet (10 mg total) by mouth daily.  30 tablet  5  . atorvastatin (LIPITOR) 40 MG tablet Take 1 tablet (40 mg total) by mouth daily.  30 tablet  5  . spironolactone (ALDACTONE) 25 MG tablet Take 1 tablet (25 mg total) by mouth daily.  30 tablet  5  . valsartan-hydrochlorothiazide (DIOVAN-HCT) 80-12.5 MG per tablet Take 1 tablet by mouth daily.  30 tablet  5  . [DISCONTINUED] famotidine (PEPCID) 20 MG tablet Take 1 tablet (20 mg total) by mouth 2 (two) times daily.  60 tablet  6   No current facility-administered medications for this visit.    Past Medical History  Diagnosis Date  . Hypertension     Since 1997  . Hyperlipidemia     x5 years  . Mitral regurgitation     Mild to moderate  .  Bradycardia   . 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 Atrial flutter     Typical by EKG diagnosis 9/11 s/p CIT ablation 11/11  . DJD (degenerative joint disease)   . Shortness of breath     Past Surgical History  Procedure Laterality Date  . Knee arthroscopy      Right  . Tubal ligation    . Lumbar spine surgery    . Total knee arthroplasty      Left  . Atrial ablation surgery  06/2010  . Hip surgery      Left (fracture) 3/13    ROS:  As stated in the HPI and negative for all other systems.  PHYSICAL EXAM BP 153/68  Pulse 67  Ht 5\' 3"  (1.6 m)  Wt 171 lb (77.565 kg)  BMI 30.30 kg/m2  LMP 11/05/1992 PHYSICAL EXAM GENERAL:  Well appearing NECK:  No jugular venous distention, waveform within normal limits, carotid upstroke brisk and symmetric, no bruits, no thyromegaly LUNGS:  Clear to auscultation bilaterally BACK:  No CVA tenderness CHEST:  Unremarkable HEART:  PMI not displaced or sustained,S1 and S2 within normal limits, no S3, no S4, no clicks, no rubs, soft apical systolic murmur without radiation. ABD:  Flat, positive bowel sounds normal in frequency  in pitch, no bruits, no rebound, no guarding, no midline pulsatile mass, no hepatomegaly, no splenomegaly EXT:  2 plus pulses throughout, no edema, no cyanosis no clubbing   EKG:  Junctional rhythm with premature atrial contractions, interventricular conduction delay, left axis deviation, no acute ST-T wave changes. 05/23/2014   ASSESSMENT AND PLAN  Atrial flutter -  The patient has had multiple atrial ectopy.  A couple of years ago there may have been some very brief runs of fibrillation though this was very difficult to sort out from her multifocal atrial ectopy. There've been no documented sustained episodes. She reports bleeding his only. She says she does tolerate aspirin. Therefore, I have not elected to put her on anticoagulant. She had had the risks benefits discussion of this.  MITRAL  REGURGITATION -  This is mild and no further testing is indicated.  I will follow this clinically and with repeat echocardiogram which I will order today.   HYPERTENSION -  The blood pressure is slightly elevated.  However, this is unusual. No change in medications is indicated. We will continue with therapeutic lifestyle changes (TLC).

## 2014-06-09 ENCOUNTER — Ambulatory Visit (HOSPITAL_COMMUNITY)
Admission: RE | Admit: 2014-06-09 | Discharge: 2014-06-09 | Disposition: A | Discharge status: Home / Self Care | Payer: Medicare Other | Source: Ambulatory Visit | Attending: Cardiology | Admitting: Cardiology

## 2014-06-09 DIAGNOSIS — E785 Hyperlipidemia, unspecified: Secondary | ICD-10-CM | POA: Insufficient documentation

## 2014-06-09 DIAGNOSIS — I340 Nonrheumatic mitral (valve) insufficiency: Secondary | ICD-10-CM

## 2014-06-09 DIAGNOSIS — I4892 Unspecified atrial flutter: Secondary | ICD-10-CM

## 2014-06-09 DIAGNOSIS — I059 Rheumatic mitral valve disease, unspecified: Secondary | ICD-10-CM

## 2014-06-09 DIAGNOSIS — I10 Essential (primary) hypertension: Secondary | ICD-10-CM | POA: Insufficient documentation

## 2014-06-09 DIAGNOSIS — I480 Paroxysmal atrial fibrillation: Secondary | ICD-10-CM

## 2014-06-09 NOTE — Progress Notes (Signed)
2D Echocardiogram Complete.  06/09/2014   Brooke Chavez [place], [place]

## 2014-06-16 ENCOUNTER — Encounter: Payer: Self-pay | Admitting: Nurse Practitioner

## 2014-06-16 ENCOUNTER — Ambulatory Visit (INDEPENDENT_AMBULATORY_CARE_PROVIDER_SITE_OTHER): Payer: Medicare Other | Admitting: Nurse Practitioner

## 2014-06-16 VITALS — BP 140/67 | HR 83 | Temp 98.9°F | Ht 63.0 in | Wt 174.2 lb

## 2014-06-16 DIAGNOSIS — I4892 Unspecified atrial flutter: Secondary | ICD-10-CM | POA: Diagnosis not present

## 2014-06-16 DIAGNOSIS — E663 Overweight: Secondary | ICD-10-CM

## 2014-06-16 DIAGNOSIS — R635 Abnormal weight gain: Secondary | ICD-10-CM

## 2014-06-16 DIAGNOSIS — E785 Hyperlipidemia, unspecified: Secondary | ICD-10-CM | POA: Diagnosis not present

## 2014-06-16 DIAGNOSIS — I10 Essential (primary) hypertension: Secondary | ICD-10-CM

## 2014-06-16 DIAGNOSIS — I080 Rheumatic disorders of both mitral and aortic valves: Secondary | ICD-10-CM

## 2014-06-16 NOTE — Progress Notes (Signed)
Subjective:    Patient ID: Brooke Chavez, female    DOB: [birth year], 73 y.o.   MRN: [id number]   Patient here today for follow up of chronic medical problems. No complaints today   Hypertension This is a chronic problem. The current episode started more than 1 year ago. The problem has been waxing and waning since onset. The problem is controlled. Pertinent negatives include no anxiety, blurred vision, chest pain, palpitations, peripheral edema or shortness of breath. Risk factors for coronary artery disease include dyslipidemia and post-menopausal state. Past treatments include diuretics, calcium channel blockers and angiotensin blockers. The current treatment provides moderate improvement. There is no history of a thyroid problem. There is no history of sleep apnea.  Hyperlipidemia This is a chronic problem. The current episode started more than 1 year ago. The problem is controlled. Recent lipid tests were reviewed and are normal. She has no history of diabetes or obesity. Factors aggravating her hyperlipidemia include fatty foods. Pertinent negatives include no chest pain or shortness of breath. Current antihyperlipidemic treatment includes statins. The current treatment provides moderate improvement of lipids. Risk factors for coronary artery disease include post-menopausal, hypertension and dyslipidemia.  Osteoporosis Pt takes Fosamax weekly-Pt states last dexascan was within the last year.  Atrial flutter/mitral valve regurgitation Patient saw echo cardiogram 1 week ago but have not gotten results as of yet. Results foiund in computer and patient told that mitral valve regure has gone from mild to moderate.  Review of Systems  Eyes: Negative for blurred vision.  Respiratory: Negative for shortness of breath.   Cardiovascular: Negative for chest pain and palpitations.  All other systems reviewed and are negative.      Objective:   Physical Exam  Constitutional: She is oriented to  person, place, and time. She appears well-developed and well-nourished.  HENT:  Nose: Nose normal.  Mouth/Throat: Oropharynx is clear and moist.  Eyes: EOM are normal.  Neck: Trachea normal, normal range of motion and full passive range of motion without pain. Neck supple. No JVD present. Carotid bruit is not present. No thyromegaly present.  Cardiovascular: Normal rate, normal heart sounds and intact distal pulses.  Exam reveals no gallop and no friction rub.   No murmur heard. Irregular heart beat  Pulmonary/Chest: Effort normal and breath sounds normal.  Abdominal: Soft. Bowel sounds are normal. She exhibits no distension and no mass. There is no tenderness.  Musculoskeletal: Normal range of motion.  Lymphadenopathy:    She has no cervical adenopathy.  Neurological: She is alert and oriented to person, place, and time. She has normal reflexes.  Skin: Skin is warm and dry.  Psychiatric: She has a normal mood and affect. Her behavior is normal. Judgment and thought content normal.    BP 140/67 mmHg  Pulse 83  Temp(Src) 98.9 F (37.2 C) (Oral)  Ht _0  (1.6 m)  Wt 174 lb 3.2 oz (79.017 kg)  BMI 30.87 kg/m2  LMP 11/05/1992       Assessment & Plan:   1. Essential hypertension Low NA diet - CMP14+EGFR  2. Hyperlipidemia with target LDL less than 100 Watch fats in diet - NMR, lipoprofile  3. Overweight Discussed diet and exercise for person with BMI >25 Will recheck weight in 3-6 months   4. MITRAL REGURGITATION Keep follow up with cardiology Make sure repeat echo in 1 year  5. Atrial flutter, unspecified    Patient to schedule pap at next appointment Patient will schedule mammo Labs pending Health maintenance  reviewed Diet and exercise encouraged Continue all meds Follow up  In 3 month   [place], FNP

## 2014-06-16 NOTE — Patient Instructions (Signed)
Mitral Valvular Regurgitation Mitral valvular regurgitation (MVR, MR) is a condition in which there is a leaky mitral valve. The mitral valve is the large valve between the two left chambers of the heart. When the large muscular ventricle contracts to pump blood, the mitral valve keeps that blood from flowing backward and back into the atrium. If there is too much regurgitation, the heart has to work harder. This eventually can cause heart failure. When your heart goes into failure, you do not feel well. You have shortness of breath (dyspnea) with exertion. The kidneys do not work as well so you may retain fluid. This is one of the reasons your lower legs and ankles may swell. You may have a rapid weight gain. In addition to this swelling, the fluid retention makes fluid back up in the lungs. This causes additional shortness of breath, which then makes the failure worse. The first sign you will usually recognize is shortness of breath with exertion (climbing stairs for example). You will also usually get a rapid heartbeat. Upon discharge from this location, weigh yourself after arriving home. Record your weight at the same time every day as this will provide a record of your progress. As you get better, your weight will usually go down. Follow a low sodium (low salt) diet. You may notice that you get short of breath while sleeping. The heart actually has to work harder while you are lying down. This may also produce a night cough or make it necessary to sleep with two or more pillows. SYMPTOMS  You may have no symptoms if mitral regurgitation is mild. It may be discovered only during a routine exam by your caregiver when a heart murmur is heard. DIAGNOSIS  The best study for mitral regurgitation is the echocardiogram (ultrasound of the heart). This shows the cause of the MR and how bad it is. It also gives information about the left ventricle and atrium (the two heart chambers that are bridged by the mitral  valve.) TREATMENT   Early MR may be treated with medications. If there are no medication allergies or problems, ACE Inhibitors are commonly used in the treatment of MR. Under treatment, these symptoms usually improve rapidly. Medications treat but will not cure or slow the progression of the MR. This is more dependent on the cause of the MR.  If MR becomes more severe, surgery may become necessary to repair or replace the valve. This is called open heart surgery.  There is no absolute age limitation to valve surgery. 73 year old people have had their valves replaced. The risk of stroke and death is low with open heart surgery, but does increase a with age and other medical problems. Elderly patients that are otherwise healthy usually do well with valve replacement surgery.  A cardiac catheterization is usually done prior to valve surgery unless the patient is very young. This is done to check the health of coronary arteries. If there are blockages, a bypass can be done while the chest is open for the valve replacement. If you are beginning to have symptoms from mitral regurgitation or are waiting for a surgical procedure to help you with this problem, following are some of the things you can do to help yourself while you are waiting for surgery or are simply putting off the surgery to see if it is needed. HOME CARE INSTRUCTIONS   Activity Level--- Your caregiver will help you determine what type of exercise program may be helpful. It is important  to maintain strength and increase it if possible. Pace your activities and avoid shortness of breath or chest pain. Plan activities for at least an hour after meals or before eating. This allows your body to handle one activity at a time. Your caregiver can help advise you for activities.  Diet--- Maintain a low salt diet or as directed by your caregiver and eat a heart healthy diet. Get diet information from your caregiver or dietician. Remove your salt  shaker and avoid adding salt to you foods. Measure the amounts of fluids you take in per day in cups and record these amounts.  Discharge Medications--- You may have been prescribed an ACE inhibitor or a beta blocker to take for your heart failure. Take either as directed as this improves your heart function and your survival. Ask your caregiver if being on statins (cholesterol lowering drugs) would be helpful.  Weight Monitoring---Weigh yourself today. When you get home, compare it to your scale and record your weight. Weigh twice per day and record these weights and try to weigh at the same time every day. It is best to weigh first thing in the morning, in your same clothes, after going to the bathroom and before eating or drinking anything. Place the scale on a hard surfaced floor. Bring these weights to your caregiver to be reviewed during your appointments.  Blood pressure monitoring should be done twice per week. You can get a home blood pressure cuff at your drugstore. Record these values and bring them with you for your clinic visits. Notify your caregiver if you become dizzy or lightheaded upon standing up.  Be familiar with your medications--- If you have trouble remembering when you took them, write down times or set your medications out in advance for the day or the week to avoid problems. If you are on medications and do not remember if you have taken your medication, just skip it for that day unless your caregiver advises you otherwise. If you are on a diuretic (water pill), take these in the morning so you are not up all night going to the bathroom.  If you are currently a smoker, it is time to quit. Nicotine makes your heart work harder and is one of the leading causes of cardiac (heart) deaths. Do not leave without a smoking cessation plan or instructions on help available to quit smoking.  Immunization with influenza and pneumococcal vaccines may reduce the risk of respiratory  infection.  Nonsteroidal anti-inflammatory drugs should not be used. They can cause sodium (salt) retention and also may hurt the action of diuretics and ACE inhibitors.  Aldosterone Antagonists may have beneficial effects.  If you do not follow your diet and take your medications properly, this may rapidly lead to emergency care or hospitalization. Follow the advice of your caregiver.  What To Do If Symptoms Worsen--- If there are immediate problems go to the Emergency Department. This would include any symptoms which brought you in and which are getting worse rather than better. Call emergency services (911 in [place] for immediate care. DAILY PATH TO QUALITY LIVING  Monitor weight and record.  Monitor blood pressure and record.  Monitor fluid intake.  Monitor Sodium intake.  Monitor Activity Levels.  Take your medications.  Stop all use of nicotine.  Avoid alcohol.  Know when to call for help and do so. SEEK IMMEDIATE MEDICAL CARE IF:  Your weight increases by 03 lb/1.4 kg in 1 day or 05 lb/2.3 kg in a week,  or as your caregiver suggests.  You notice increasing shortness of breath during rest, sleeping, or with activity, and which is unusual for you.  You develop chest pain.  You develop sweating or nausea which is unusual for you.  You notice increased swelling in your hands, feet, ankles or abdomen.  You have a feeling of fullness in your abdomen or develop nausea or loss of appetite.  You notice dizziness, blurred vision, headache, or unsteadiness. Make an appointment with your caregiver as directed for follow-up. MAKE SURE YOU:   Understand these instructions.  Will watch your condition.  Will get help right away if you are not doing well or get worse. Document Released: 10/15/2004 Document Revised: 10/20/2011 Document Reviewed: 09/24/2007 [facility] Patient Information 2015 [place], [place]. This information is not intended to replace advice given to you by your  health care provider. Make sure you discuss any questions you have with your health care provider.

## 2014-06-17 LAB — CMP14+EGFR
ALT: 13 IU/L (ref 0–32)
AST: 22 IU/L (ref 0–40)
Albumin/Globulin Ratio: 1.6 (ref 1.1–2.5)
Albumin: 4.4 g/dL (ref 3.5–4.8)
Alkaline Phosphatase: 62 IU/L (ref 39–117)
BUN/Creatinine Ratio: 15 (ref 11–26)
BUN: 22 mg/dL (ref 8–27)
CO2: 23 mmol/L (ref 18–29)
Calcium: 9.8 mg/dL (ref 8.7–10.3)
Chloride: 101 mmol/L (ref 97–108)
Creatinine, Ser: 1.46 mg/dL — ABNORMAL HIGH (ref 0.57–1.00)
GFR calc Af Amer: 41 mL/min/{1.73_m2} — ABNORMAL LOW (ref >59)
GFR calc non Af Amer: 35 mL/min/{1.73_m2} — ABNORMAL LOW (ref >59)
Globulin, Total: 2.7 g/dL (ref 1.5–4.5)
Glucose: 82 mg/dL (ref 65–99)
Potassium: 6 mmol/L (ref 3.5–5.2)
Sodium: 141 mmol/L (ref 134–144)
Total Bilirubin: 0.5 mg/dL (ref 0.0–1.2)
Total Protein: 7.1 g/dL (ref 6.0–8.5)

## 2014-06-17 LAB — NMR, LIPOPROFILE
Cholesterol: 192 mg/dL (ref 100–199)
HDL Cholesterol by NMR: 66 mg/dL (ref >39)
HDL Particle Number: 36 umol/L (ref >=30.5)
LDL Particle Number: 1319 nmol/L — ABNORMAL HIGH (ref <1000)
LDL Size: 21.4 nm (ref >20.5)
LDL-C: 104 mg/dL — ABNORMAL HIGH (ref 0–99)
LP-IR Score: <25 (ref <=45)
Small LDL Particle Number: 472 nmol/L (ref <=527)
Triglycerides by NMR: 111 mg/dL (ref 0–149)

## 2014-06-19 ENCOUNTER — Other Ambulatory Visit (INDEPENDENT_AMBULATORY_CARE_PROVIDER_SITE_OTHER): Payer: Medicare Other

## 2014-06-19 DIAGNOSIS — E875 Hyperkalemia: Secondary | ICD-10-CM

## 2014-06-19 LAB — POTASSIUM: Potassium: 4.4 mmol/L (ref 3.5–5.2)

## 2014-06-19 NOTE — Progress Notes (Signed)
Lab only 

## 2014-06-20 ENCOUNTER — Telehealth: Payer: Self-pay | Admitting: Family Medicine

## 2014-06-20 NOTE — Telephone Encounter (Signed)
-----   Message from [name], [place] sent at 06/20/2014 10:02 AM EST ----- K normal

## 2014-06-21 NOTE — Telephone Encounter (Signed)
Stp advised "K normal" just meant her potassium levels were normal, pt voiced understanding, will close call-kc

## 2014-07-11 DIAGNOSIS — Z96651 Presence of right artificial knee joint: Secondary | ICD-10-CM | POA: Diagnosis not present

## 2014-07-11 DIAGNOSIS — Z96652 Presence of left artificial knee joint: Secondary | ICD-10-CM | POA: Diagnosis not present

## 2014-07-11 DIAGNOSIS — Z471 Aftercare following joint replacement surgery: Secondary | ICD-10-CM | POA: Diagnosis not present

## 2014-07-19 ENCOUNTER — Encounter (HOSPITAL_COMMUNITY): Payer: Self-pay | Admitting: Internal Medicine

## 2014-08-07 DIAGNOSIS — Z1231 Encounter for screening mammogram for malignant neoplasm of breast: Secondary | ICD-10-CM | POA: Diagnosis not present

## 2014-09-19 ENCOUNTER — Ambulatory Visit (INDEPENDENT_AMBULATORY_CARE_PROVIDER_SITE_OTHER): Payer: Medicare Other | Admitting: Nurse Practitioner

## 2014-09-19 ENCOUNTER — Encounter: Payer: Self-pay | Admitting: Nurse Practitioner

## 2014-09-19 VITALS — BP 123/67 | HR 52 | Temp 97.8°F | Ht 62.5 in | Wt 168.0 lb

## 2014-09-19 DIAGNOSIS — Z01419 Encounter for gynecological examination (general) (routine) without abnormal findings: Secondary | ICD-10-CM

## 2014-09-19 DIAGNOSIS — Z0000 Encounter for general adult medical examination without abnormal findings: Secondary | ICD-10-CM | POA: Diagnosis not present

## 2014-09-19 DIAGNOSIS — I10 Essential (primary) hypertension: Secondary | ICD-10-CM | POA: Diagnosis not present

## 2014-09-19 DIAGNOSIS — M810 Age-related osteoporosis without current pathological fracture: Secondary | ICD-10-CM | POA: Diagnosis not present

## 2014-09-19 DIAGNOSIS — E785 Hyperlipidemia, unspecified: Secondary | ICD-10-CM

## 2014-09-19 LAB — POCT CBC
Granulocyte percent: 72.7 %G (ref 37–80)
HCT, POC: 41.3 % (ref 37.7–47.9)
Hemoglobin: 12.8 g/dL (ref 12.2–16.2)
Lymph, poc: 1.8 (ref 0.6–3.4)
MCH, POC: 29.2 pg (ref 27–31.2)
MCHC: 31 g/dL — AB (ref 31.8–35.4)
MCV: 94.1 fL (ref 80–97)
MPV: 8.4 fL (ref 0–99.8)
POC Granulocyte: 6.3 (ref 2–6.9)
POC LYMPH PERCENT: 21.4 %L (ref 10–50)
Platelet Count, POC: 270 10*3/uL (ref 142–424)
RBC: 4.4 M/uL (ref 4.04–5.48)
RDW, POC: 13.7 %
WBC: 8.6 10*3/uL (ref 4.6–10.2)

## 2014-09-19 LAB — POCT URINALYSIS DIPSTICK
Bilirubin, UA: NEGATIVE
Blood, UA: NEGATIVE
Glucose, UA: NEGATIVE
Ketones, UA: NEGATIVE
Leukocytes, UA: NEGATIVE
Nitrite, UA: NEGATIVE
Protein, UA: NEGATIVE
Spec Grav, UA: 1.01
Urobilinogen, UA: NEGATIVE
pH, UA: 5

## 2014-09-19 LAB — POCT UA - MICROSCOPIC ONLY
Bacteria, U Microscopic: NEGATIVE
Casts, Ur, LPF, POC: NEGATIVE
Crystals, Ur, HPF, POC: NEGATIVE
Mucus, UA: NEGATIVE
RBC, urine, microscopic: NEGATIVE
WBC, Ur, HPF, POC: NEGATIVE
Yeast, UA: NEGATIVE

## 2014-09-19 MED ORDER — SPIRONOLACTONE 25 MG PO TABS: 25.0000 mg | ORAL_TABLET | Freq: Every day | ORAL | Status: DC

## 2014-09-19 MED ORDER — VALSARTAN-HYDROCHLOROTHIAZIDE 80-12.5 MG PO TABS: 1.0000 | ORAL_TABLET | Freq: Every day | ORAL | Status: DC

## 2014-09-19 MED ORDER — AMLODIPINE BESYLATE 10 MG PO TABS: 10.0000 mg | ORAL_TABLET | Freq: Every day | ORAL | Status: DC

## 2014-09-19 MED ORDER — ATORVASTATIN CALCIUM 40 MG PO TABS: 40.0000 mg | ORAL_TABLET | Freq: Every day | ORAL | Status: DC

## 2014-09-19 NOTE — Addendum Note (Signed)
Addended by: Earlene Plater on: 09/19/2014 09:40 AM   Modules accepted: Miquel Dunn

## 2014-09-19 NOTE — Progress Notes (Signed)
Subjective:    Patient ID: Brooke Chavez, female    DOB: 01-27-1941, 74 y.o.   MRN: [id number]   Patient here today for CPE with pap. No acute complaint.    Hypertension This is a chronic problem. The current episode started more than 1 year ago. The problem has been waxing and waning since onset. The problem is controlled. Pertinent negatives include no anxiety, blurred vision, chest pain, palpitations, peripheral edema or shortness of breath. Risk factors for coronary artery disease include dyslipidemia and post-menopausal state. Past treatments include diuretics, calcium channel blockers and angiotensin blockers. The current treatment provides moderate improvement. There is no history of a thyroid problem. There is no history of sleep apnea.  Hyperlipidemia This is a chronic problem. The current episode started more than 1 year ago. The problem is controlled. Recent lipid tests were reviewed and are normal. She has no history of diabetes or obesity. Factors aggravating her hyperlipidemia include fatty foods. Pertinent negatives include no chest pain or shortness of breath. Current antihyperlipidemic treatment includes statins. The current treatment provides moderate improvement of lipids. Risk factors for coronary artery disease include post-menopausal, hypertension and dyslipidemia.  Osteoporosis Pt  States she stop taking the fosamax because she couldn't remember.  Atrial flutter/mitral valve regurgitation Patient is seeing Dr. Ritta Slot, last appointment was in 2015.  Review of Systems  Constitutional: Negative.   HENT: Negative.   Eyes: Negative.  Negative for blurred vision.  Respiratory: Negative.  Negative for shortness of breath.   Cardiovascular: Negative for chest pain and palpitations.  Gastrointestinal: Negative.   Endocrine: Negative.   Genitourinary: Negative.   Musculoskeletal: Negative.   Skin: Negative.   Allergic/Immunologic: Negative.   Neurological: Negative.    Hematological: Negative.   Psychiatric/Behavioral: Negative.   All other systems reviewed and are negative.      Objective:   Physical Exam  Constitutional: She is oriented to person, place, and time. She appears well-developed and well-nourished.  HENT:  Head: Normocephalic.  Right Ear: Hearing, tympanic membrane, external ear and ear canal normal.  Left Ear: Hearing, tympanic membrane, external ear and ear canal normal.  Nose: Nose normal.  Mouth/Throat: Uvula is midline and oropharynx is clear and moist.  Eyes: Conjunctivae and EOM are normal. Pupils are equal, round, and reactive to light.  Neck: Trachea normal, normal range of motion and full passive range of motion without pain. Neck supple. No JVD present. Carotid bruit is not present. No thyroid mass and no thyromegaly present.  Cardiovascular: Normal rate, normal heart sounds and intact distal pulses.  Exam reveals no gallop and no friction rub.   No murmur heard. Regularly Irregular heart rate.   Pulmonary/Chest: Effort normal and breath sounds normal. Right breast exhibits no inverted nipple, no mass, no nipple discharge, no skin change and no tenderness. Left breast exhibits no inverted nipple, no mass, no nipple discharge, no skin change and no tenderness.  Abdominal: Soft. Bowel sounds are normal. She exhibits no distension and no mass. There is no tenderness.  Genitourinary: Vagina normal and uterus normal. No breast swelling, tenderness, discharge or bleeding.  bimanual exam-No adnexal masses or tenderness. Cervix is parous and pink with nabothin cyst on posterior asect Small posterior wall uterine fibroid.  Musculoskeletal: Normal range of motion.  Lymphadenopathy:    She has no cervical adenopathy.  Neurological: She is alert and oriented to person, place, and time. She has normal reflexes.  Skin: Skin is warm and dry.  Psychiatric: She has a normal  mood and affect. Her behavior is normal. Judgment and thought  content normal.    BP 123/67 mmHg  Pulse 52  Temp(Src) 97.8 F (36.6 C) (Oral)  Ht 5' 2.5" (1.588 m)  Wt 168 lb (76.204 kg)  BMI 30.22 kg/m2  LMP 11/05/1992       Assessment & Plan:   1. Annual physical exam - POCT UA - Microscopic Only - POCT urinalysis dipstick - POCT CBC - Thyroid Panel With TSH  2. Essential hypertension No salt added to diet - CMP14+EGFR - amLODipine (NORVASC) 10 MG tablet; Take 1 tablet (10 mg total) by mouth daily.  Dispense: 30 tablet; Refill: 5 - spironolactone (ALDACTONE) 25 MG tablet; Take 1 tablet (25 mg total) by mouth daily.  Dispense: 30 tablet; Refill: 5 - valsartan-hydrochlorothiazide (DIOVAN-HCT) 80-12.5 MG per tablet; Take 1 tablet by mouth daily.  Dispense: 30 tablet; Refill: 5  3. Hyperlipidemia with target LDL less than 100 Low fat - NMR, lipoprofile - atorvastatin (LIPITOR) 40 MG tablet; Take 1 tablet (40 mg total) by mouth daily.  Dispense: 30 tablet; Refill: 5  4. Osteoporosis Weight bearing exercises - DG Bone Density; Future  5. Encounter for routine gynecological examination - Pap IG (Image Guided)   hemoccult cards given to patient with directions Labs pending Health maintenance reviewed Diet and exercise encouraged Continue all meds Follow up  In 6 months   [place], FNP

## 2014-09-19 NOTE — Patient Instructions (Signed)
Pap Test A Pap test is a procedure done in a clinic office to evaluate cells that are on the surface of the cervix. The cervix is the lower portion of the uterus and upper portion of the vagina. For some women, the cervical region has the potential to form cancer. With consistent evaluations by your caregiver, this type of cancer can be prevented.  If a Pap test is abnormal, it is most often a result of a previous exposure to human papillomavirus (HPV). HPV is a virus that can infect the cells of the cervix and cause dysplasia. Dysplasia is where the cells no longer look normal. If a woman has been diagnosed with high-grade or severe dysplasia, they are at higher risk of developing cervical cancer. People diagnosed with low-grade dysplasia should still be seen by their caregiver because there is a small chance that low-grade dysplasia could develop into cancer.  LET YOUR CAREGIVER KNOW ABOUT:  Recent sexually transmitted infection (STI) you have had.  Any new sex partners you have had.  History of previous abnormal Pap tests results.  History of previous cervical procedures you have had (colposcopy, biopsy, loop electrosurgical excision procedure [LEEP]).  Concerns you have had regarding unusual vaginal discharge.  History of pelvic pain.  Your use of birth control. BEFORE THE PROCEDURE  Ask your caregiver when to schedule your Pap test. It is best not to be on your period if your caregiver uses a wooden spatula to collect cells or applies cells to a glass slide. Newer techniques are not so sensitive to the timing of a menstrual cycle.  Do not douche or have sexual intercourse for 24 hours before the test.   Do not use vaginal creams or tampons for 24 hours before the test.   Empty your bladder just before the test to lessen any discomfort.  PROCEDURE You will lie on an exam table with your feet in stirrups. A warm metal or plastic instrument (speculum) is placed in your vagina. This  instrument allows your caregiver to see the inside of your vagina and look at your cervix. A small, plastic brush or wooden spatula is then used to collect cervical cells. These cells are placed in a lab specimen container. The cells are looked at under a microscope. A specialist will determine if the cells are normal.  AFTER THE PROCEDURE Make sure to get your test results.If your results come back abnormal, you may need further testing.  Document Released: 10/18/2002 Document Revised: 10/20/2011 Document Reviewed: 07/24/2011 [employer] Patient Information 2015 [employer]. This information is not intended to replace advice given to you by your health care provider. Make sure you discuss any questions you have with your health care provider.  

## 2014-09-20 LAB — CMP14+EGFR
ALT: 13 IU/L (ref 0–32)
AST: 23 IU/L (ref 0–40)
Albumin/Globulin Ratio: 1.6 (ref 1.1–2.5)
Albumin: 4.1 g/dL (ref 3.5–4.8)
Alkaline Phosphatase: 71 IU/L (ref 39–117)
BUN/Creatinine Ratio: 17 (ref 11–26)
BUN: 25 mg/dL (ref 8–27)
Bilirubin Total: 0.4 mg/dL (ref 0.0–1.2)
CO2: 23 mmol/L (ref 18–29)
Calcium: 9.5 mg/dL (ref 8.7–10.3)
Chloride: 103 mmol/L (ref 97–108)
Creatinine, Ser: 1.48 mg/dL — ABNORMAL HIGH (ref 0.57–1.00)
GFR calc Af Amer: 40 mL/min/{1.73_m2} — ABNORMAL LOW (ref >59)
GFR calc non Af Amer: 35 mL/min/{1.73_m2} — ABNORMAL LOW (ref >59)
Globulin, Total: 2.5 g/dL (ref 1.5–4.5)
Glucose: 90 mg/dL (ref 65–99)
Potassium: 4.5 mmol/L (ref 3.5–5.2)
Sodium: 141 mmol/L (ref 134–144)
Total Protein: 6.6 g/dL (ref 6.0–8.5)

## 2014-09-20 LAB — NMR, LIPOPROFILE
Cholesterol: 177 mg/dL (ref 100–199)
HDL Cholesterol by NMR: 67 mg/dL (ref >39)
HDL Particle Number: 34.8 umol/L (ref >=30.5)
LDL Particle Number: 1025 nmol/L — ABNORMAL HIGH (ref <1000)
LDL Size: 21.1 nm (ref >20.5)
LDL-C: 98 mg/dL (ref 0–99)
LP-IR Score: <25 (ref <=45)
Small LDL Particle Number: 194 nmol/L (ref <=527)
Triglycerides by NMR: 58 mg/dL (ref 0–149)

## 2014-09-20 LAB — PAP IG (IMAGE GUIDED): PAP Smear Comment: 0

## 2014-09-20 LAB — THYROID PANEL WITH TSH
Free Thyroxine Index: 2.4 (ref 1.2–4.9)
T3 Uptake Ratio: 31 % (ref 24–39)
T4, Total: 7.9 ug/dL (ref 4.5–12.0)
TSH: 2.46 u[IU]/mL (ref 0.450–4.500)

## 2014-10-19 ENCOUNTER — Other Ambulatory Visit: Payer: Medicare Other

## 2014-10-19 DIAGNOSIS — Z1212 Encounter for screening for malignant neoplasm of rectum: Secondary | ICD-10-CM | POA: Diagnosis not present

## 2014-10-19 NOTE — Addendum Note (Signed)
Addended by: Earlene Plater on: 10/19/2014 03:04 PM   Modules accepted: Orders

## 2014-10-19 NOTE — Progress Notes (Signed)
Lab only 

## 2014-10-22 LAB — FECAL OCCULT BLOOD, IMMUNOCHEMICAL: Fecal Occult Bld: NEGATIVE

## 2014-11-27 ENCOUNTER — Telehealth: Payer: Self-pay | Admitting: Nurse Practitioner

## 2014-11-29 ENCOUNTER — Ambulatory Visit: Payer: Medicare Other

## 2014-11-29 ENCOUNTER — Other Ambulatory Visit: Payer: Medicare Other

## 2015-01-26 ENCOUNTER — Other Ambulatory Visit: Payer: Self-pay | Admitting: Nurse Practitioner

## 2015-02-05 ENCOUNTER — Other Ambulatory Visit: Payer: Self-pay

## 2015-03-19 ENCOUNTER — Other Ambulatory Visit: Payer: Self-pay | Admitting: Nurse Practitioner

## 2015-03-22 ENCOUNTER — Telehealth: Payer: Self-pay | Admitting: Nurse Practitioner

## 2015-03-22 DIAGNOSIS — I10 Essential (primary) hypertension: Secondary | ICD-10-CM

## 2015-03-22 DIAGNOSIS — E785 Hyperlipidemia, unspecified: Secondary | ICD-10-CM

## 2015-03-22 MED ORDER — AMLODIPINE BESYLATE 10 MG PO TABS: ORAL_TABLET | ORAL | Status: DC

## 2015-03-22 MED ORDER — VALSARTAN-HYDROCHLOROTHIAZIDE 80-12.5 MG PO TABS: 1.0000 | ORAL_TABLET | Freq: Every day | ORAL | Status: DC

## 2015-03-22 MED ORDER — ATORVASTATIN CALCIUM 40 MG PO TABS: 40.0000 mg | ORAL_TABLET | Freq: Every day | ORAL | Status: DC

## 2015-03-22 MED ORDER — SPIRONOLACTONE 25 MG PO TABS: 25.0000 mg | ORAL_TABLET | Freq: Every day | ORAL | Status: DC

## 2015-03-22 NOTE — Telephone Encounter (Signed)
done

## 2015-04-10 ENCOUNTER — Ambulatory Visit (INDEPENDENT_AMBULATORY_CARE_PROVIDER_SITE_OTHER): Payer: Medicare Other | Admitting: Nurse Practitioner

## 2015-04-10 ENCOUNTER — Ambulatory Visit (INDEPENDENT_AMBULATORY_CARE_PROVIDER_SITE_OTHER): Payer: Medicare Other

## 2015-04-10 ENCOUNTER — Other Ambulatory Visit: Payer: Medicare Other

## 2015-04-10 ENCOUNTER — Encounter: Payer: Self-pay | Admitting: Nurse Practitioner

## 2015-04-10 VITALS — BP 140/84 | HR 91 | Temp 98.4°F | Ht 62.0 in | Wt 170.0 lb

## 2015-04-10 DIAGNOSIS — R001 Bradycardia, unspecified: Secondary | ICD-10-CM

## 2015-04-10 DIAGNOSIS — E785 Hyperlipidemia, unspecified: Secondary | ICD-10-CM | POA: Diagnosis not present

## 2015-04-10 DIAGNOSIS — I4892 Unspecified atrial flutter: Secondary | ICD-10-CM

## 2015-04-10 DIAGNOSIS — Z6831 Body mass index (BMI) 31.0-31.9, adult: Secondary | ICD-10-CM

## 2015-04-10 DIAGNOSIS — I10 Essential (primary) hypertension: Secondary | ICD-10-CM

## 2015-04-10 DIAGNOSIS — M810 Age-related osteoporosis without current pathological fracture: Secondary | ICD-10-CM

## 2015-04-10 MED ORDER — VALSARTAN-HYDROCHLOROTHIAZIDE 80-12.5 MG PO TABS: 1.0000 | ORAL_TABLET | Freq: Every day | ORAL | Status: DC

## 2015-04-10 MED ORDER — AMLODIPINE BESYLATE 10 MG PO TABS: ORAL_TABLET | ORAL | Status: DC

## 2015-04-10 MED ORDER — SPIRONOLACTONE 25 MG PO TABS: 25.0000 mg | ORAL_TABLET | Freq: Every day | ORAL | Status: DC

## 2015-04-10 MED ORDER — ATORVASTATIN CALCIUM 40 MG PO TABS: 40.0000 mg | ORAL_TABLET | Freq: Every day | ORAL | Status: DC

## 2015-04-10 NOTE — Progress Notes (Addendum)
Subjective:    Patient ID: Brooke Chavez, female    DOB: 12-22-1940, 74 y.o.   MRN: [id number]   Patient here today for follow up of chronic medical problems. No complaints today   Hypertension This is a chronic problem. The current episode started more than 1 year ago. The problem has been waxing and waning since onset. The problem is controlled. Pertinent negatives include no anxiety, blurred vision, chest pain, palpitations, peripheral edema or shortness of breath. There are no associated agents to hypertension. Risk factors for coronary artery disease include dyslipidemia and post-menopausal state. Past treatments include diuretics, calcium channel blockers and angiotensin blockers. The current treatment provides moderate improvement. There are no compliance problems.  There is no history of a thyroid problem. There is no history of sleep apnea.  Hyperlipidemia This is a chronic problem. The current episode started more than 1 year ago. The problem is controlled. Recent lipid tests were reviewed and are normal. She has no history of diabetes or obesity. Factors aggravating her hyperlipidemia include fatty foods. Pertinent negatives include no chest pain or shortness of breath. Current antihyperlipidemic treatment includes statins. The current treatment provides moderate improvement of lipids. Risk factors for coronary artery disease include post-menopausal, hypertension and dyslipidemia.  Osteoporosis Pt stopped taking her fosamax about 6 months ago. States she had a hard time remembering to take the medication. Atrial flutter/mitral valve regurgitation Pt states she see Dr. Casimer Lanius about a year ago and received a good report. States she is due to see him soon.   Review of Systems  Constitutional: Negative.   HENT: Negative.   Eyes: Negative for blurred vision.  Respiratory: Negative.  Negative for shortness of breath.   Cardiovascular: Negative.  Negative for chest pain and palpitations.   Gastrointestinal: Negative.   Genitourinary: Negative.   Musculoskeletal: Negative.   Neurological: Negative.   All other systems reviewed and are negative.      Objective:   Physical Exam  Constitutional: She is oriented to person, place, and time. She appears well-developed and well-nourished.  HENT:  Nose: Nose normal.  Mouth/Throat: Oropharynx is clear and moist.  Eyes: EOM are normal.  Neck: Trachea normal, normal range of motion and full passive range of motion without pain. Neck supple. No JVD present. Carotid bruit is not present. No thyromegaly present.  Cardiovascular: Normal rate, normal heart sounds and intact distal pulses.  Exam reveals no gallop and no friction rub.   No murmur heard. Irregular heart beat  Pulmonary/Chest: Effort normal and breath sounds normal.  Abdominal: Soft. Bowel sounds are normal. She exhibits no distension and no mass. There is no tenderness.  Musculoskeletal: Normal range of motion.  Lymphadenopathy:    She has no cervical adenopathy.  Neurological: She is alert and oriented to person, place, and time. She has normal reflexes.  Skin: Skin is warm and dry.  Psychiatric: She has a normal mood and affect. Her behavior is normal. Judgment and thought content normal.   BP 140/84 mmHg  Pulse 91  Temp(Src) 98.4 F (36.9 C) (Oral)  Ht _0  (1.575 m)  Wt 170 lb (77.111 kg)  BMI 31.09 kg/m2  LMP 11/05/1992  Chest x ray- no cardiopulmonary disorder-Preliminary reading by Ronnald Collum, FNP  [facility]         Assessment & Plan:  1. Essential hypertension Do not add salt to deit - amLODipine (NORVASC) 10 MG tablet; TAKE 1 TABLET (10 MG TOTAL) BY MOUTH DAILY.  Dispense: 30 tablet; Refill: 5 -  spironolactone (ALDACTONE) 25 MG tablet; Take 1 tablet (25 mg total) by mouth daily.  Dispense: 30 tablet; Refill: 5 - valsartan-hydrochlorothiazide (DIOVAN-HCT) 80-12.5 MG per tablet; Take 1 tablet by mouth daily.  Dispense: 30 tablet; Refill: 5 -  CMP14+EGFR - DG Chest 2 View; Future  2. Atrial flutter, unspecified  3. Osteoporosis Weight bearing exercises - DG Bone Density; Future  4. Hyperlipidemia with target LDL less than 100 Low fat diet - atorvastatin (LIPITOR) 40 MG tablet; Take 1 tablet (40 mg total) by mouth daily.  Dispense: 30 tablet; Refill: 5 - Lipid panel  5. Bradycardia  6. BMI 31.0-31.9,adult Discussed diet and exercise for person with BMI >25 Will recheck weight in 3-6 months     Labs pending Health maintenance reviewed Diet and exercise encouraged Continue all meds Follow up  In 3 month   [place], FNP

## 2015-04-10 NOTE — Patient Instructions (Signed)

## 2015-04-11 LAB — CMP14+EGFR
ALT: 16 IU/L (ref 0–32)
AST: 25 IU/L (ref 0–40)
Albumin/Globulin Ratio: 1.7 (ref 1.1–2.5)
Albumin: 4.5 g/dL (ref 3.5–4.8)
Alkaline Phosphatase: 75 IU/L (ref 39–117)
BUN/Creatinine Ratio: 18 (ref 11–26)
BUN: 25 mg/dL (ref 8–27)
Bilirubin Total: 0.5 mg/dL (ref 0.0–1.2)
CO2: 23 mmol/L (ref 18–29)
Calcium: 9.8 mg/dL (ref 8.7–10.3)
Chloride: 100 mmol/L (ref 97–108)
Creatinine, Ser: 1.41 mg/dL — ABNORMAL HIGH (ref 0.57–1.00)
GFR calc Af Amer: 42 mL/min/{1.73_m2} — ABNORMAL LOW (ref >59)
GFR calc non Af Amer: 37 mL/min/{1.73_m2} — ABNORMAL LOW (ref >59)
Globulin, Total: 2.6 g/dL (ref 1.5–4.5)
Glucose: 91 mg/dL (ref 65–99)
Potassium: 4.7 mmol/L (ref 3.5–5.2)
Sodium: 141 mmol/L (ref 134–144)
Total Protein: 7.1 g/dL (ref 6.0–8.5)

## 2015-04-11 LAB — LIPID PANEL
Chol/HDL Ratio: 2.9 ratio units (ref 0.0–4.4)
Cholesterol, Total: 191 mg/dL (ref 100–199)
HDL: 67 mg/dL (ref >39)
LDL Calculated: 105 mg/dL — ABNORMAL HIGH (ref 0–99)
Triglycerides: 97 mg/dL (ref 0–149)
VLDL Cholesterol Cal: 19 mg/dL (ref 5–40)

## 2015-04-17 ENCOUNTER — Other Ambulatory Visit: Payer: Medicare Other

## 2015-04-17 ENCOUNTER — Telehealth: Payer: Self-pay | Admitting: Nurse Practitioner

## 2015-06-01 ENCOUNTER — Telehealth: Payer: Self-pay | Admitting: Nurse Practitioner

## 2015-06-14 ENCOUNTER — Telehealth: Payer: Self-pay | Admitting: Cardiology

## 2015-06-14 NOTE — Telephone Encounter (Signed)
Close encounter 

## 2015-06-19 NOTE — Telephone Encounter (Signed)
[facility]  To schedule DEXA'; order in so anyone can schedule

## 2015-06-21 ENCOUNTER — Telehealth: Payer: Self-pay | Admitting: Nurse Practitioner

## 2015-06-22 ENCOUNTER — Encounter: Payer: Self-pay | Admitting: Cardiology

## 2015-06-22 ENCOUNTER — Ambulatory Visit (INDEPENDENT_AMBULATORY_CARE_PROVIDER_SITE_OTHER): Payer: Medicare Other | Admitting: Cardiology

## 2015-06-22 VITALS — BP 148/60 | HR 69 | Ht 63.0 in | Wt 171.0 lb

## 2015-06-22 DIAGNOSIS — I080 Rheumatic disorders of both mitral and aortic valves: Secondary | ICD-10-CM

## 2015-06-22 DIAGNOSIS — I10 Essential (primary) hypertension: Secondary | ICD-10-CM | POA: Diagnosis not present

## 2015-06-22 NOTE — Patient Instructions (Signed)
Your physician wants you to follow-up in: 1 Year. You will receive a reminder letter in the mail two months in advance. If you don't receive a letter, please call our office to schedule the follow-up appointment.  Your physician has requested that you have an echocardiogram. Echocardiography is a painless test that uses sound waves to create images of your heart. It provides your doctor with information about the size and shape of your heart and how well your heart's chambers and valves are working. This procedure takes approximately one hour. There are no restrictions for this procedure.

## 2015-06-22 NOTE — Progress Notes (Signed)
HPI The patient presents for follow up of atrial flutter.   She is s/p ablation and redo ablation.   Post procedure she had some paroxysms of probable fibrillation though these were very brief period.  She had lots of atrial ectopy and runs of atrial tachycardia. However, she had no sustained tachyarrhythmias. She had some bradycardic episodes but no sustained bradycardia arrhythmias. There were some junctional beats.   She has not been followed at the last couple of years. However, she says she's been doing well. She rarely feels any palpitations. She has not had any presyncope or syncope. She has not had any shortness of breath, PND or orthopnea. She has no weight gain or edema. She tries to stay active though she's not exercising routinely.   Allergies  Allergen Reactions  . Aspirin Nausea And Vomiting    Current Outpatient Prescriptions  Medication Sig Dispense Refill  . acetaminophen (TYLENOL) 325 MG tablet Take 325 mg by mouth every 6 (six) hours as needed. For pain       . amLODipine (NORVASC) 10 MG tablet TAKE 1 TABLET (10 MG TOTAL) BY MOUTH DAILY. 30 tablet 5  . atorvastatin (LIPITOR) 40 MG tablet Take 1 tablet (40 mg total) by mouth daily. 30 tablet 5  . spironolactone (ALDACTONE) 25 MG tablet Take 1 tablet (25 mg total) by mouth daily. 30 tablet 5  . valsartan-hydrochlorothiazide (DIOVAN-HCT) 80-12.5 MG per tablet Take 1 tablet by mouth daily. 30 tablet 5  . [DISCONTINUED] famotidine (PEPCID) 20 MG tablet Take 1 tablet (20 mg total) by mouth 2 (two) times daily. 60 tablet 6   No current facility-administered medications for this visit.    Past Medical History  Diagnosis Date  . Hypertension     Since 1997  . Hyperlipidemia     x5 years  . Mitral regurgitation     Mild to moderate  . Bradycardia   . 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 Atrial flutter ([place])     Typical by EKG diagnosis 9/11 s/p CIT ablation 11/11  . DJD (degenerative joint  disease)   . Shortness of breath     Past Surgical History  Procedure Laterality Date  . Knee arthroscopy      Right  . Tubal ligation    . Lumbar spine surgery    . Total knee arthroplasty      Left  . Atrial ablation surgery  06/2010  . Hip surgery      Left (fracture) 3/13  . A flutter ablation N/A 06/27/2011    Procedure: ABLATION A FLUTTER;  Surgeon: Thompson Grayer, MD;  Location[facility] C[facility] LAB;  Service: Cardiovascular;  Laterality: N/A;    ROS:  As stated in the HPI and negative for all other systems.  PHYSICAL EXAM BP 148/60 mmHg  Pulse 69  Ht 5\' 3"  (1.6 m)  Wt 171 lb (77.565 kg)  BMI 30.30 kg/m2  LMP 11/05/1992 PHYSICAL EXAM GENERAL:  Well appearing NECK:  No jugular venous distention, waveform within normal limits, carotid upstroke brisk and symmetric, no bruits, no thyromegaly LUNGS:  Clear to auscultation bilaterally BACK:  No CVA tenderness CHEST:  Unremarkable HEART:  PMI not displaced or sustained,S1 and S2 within normal limits, no S3, no S4, no clicks, no rubs, soft apical systolic murmur without radiation. ABD:  Flat, positive bowel sounds normal in frequency in pitch, no bruits, no rebound, no guarding, no midline pulsatile mass, no hepatomegaly, no  splenomegaly EXT:  2 plus pulses throughout, no edema, no cyanosis no clubbing   EKG:  Sinus and junctional rhythm with premature atrial contractions, interventricular conduction delay, left axis deviation, no acute ST-T wave changes. 06/22/2015   ASSESSMENT AND PLAN  Atrial flutter -  The patient has had multiple atrial ectopy.  A couple of years ago there may have been some very brief runs of fibrillation though this was very difficult to sort out from her multifocal atrial ectopy. There've been no documented sustained episodes. She reports bleeding his only. She says she does tolerate aspirin. Therefore, I have not elected to put her on anticoagulant. She had had the risks benefits discussion of  this.  MITRAL REGURGITATION -  This is mild and no further testing is indicated.  I will follow this clinically and with repeat echocardiogram which I will order today.   HYPERTENSION -  The blood pressure is slightly elevated.  However, this is unusual. No change in medications is indicated. We will continue with therapeutic lifestyle changes (TLC).

## 2015-06-22 NOTE — Telephone Encounter (Signed)
Pt aware of appointment date/time 

## 2015-06-29 ENCOUNTER — Ambulatory Visit (HOSPITAL_COMMUNITY)
Admission: RE | Admit: 2015-06-29 | Discharge: 2015-06-29 | Disposition: A | Discharge status: Home / Self Care | Payer: Medicare Other | Source: Ambulatory Visit | Attending: Cardiology | Admitting: Cardiology

## 2015-06-29 DIAGNOSIS — I340 Nonrheumatic mitral (valve) insufficiency: Secondary | ICD-10-CM | POA: Diagnosis not present

## 2015-06-29 DIAGNOSIS — I080 Rheumatic disorders of both mitral and aortic valves: Secondary | ICD-10-CM | POA: Diagnosis not present

## 2015-06-29 DIAGNOSIS — I10 Essential (primary) hypertension: Secondary | ICD-10-CM | POA: Insufficient documentation

## 2015-06-29 DIAGNOSIS — E785 Hyperlipidemia, unspecified: Secondary | ICD-10-CM | POA: Insufficient documentation

## 2015-06-29 DIAGNOSIS — I4892 Unspecified atrial flutter: Secondary | ICD-10-CM | POA: Insufficient documentation

## 2015-06-29 NOTE — Progress Notes (Signed)
*  PRELIMINARY RESULTS* Echocardiogram 2D Echocardiogram has been performed.  Leavy Cella 06/29/2015, 12:03 PM

## 2015-07-04 ENCOUNTER — Telehealth: Payer: Self-pay | Admitting: *Deleted

## 2015-07-04 DIAGNOSIS — I340 Nonrheumatic mitral (valve) insufficiency: Secondary | ICD-10-CM

## 2015-07-04 NOTE — Telephone Encounter (Signed)
-----   Message from [name], MD sent at 07/01/2015 10:22 AM EST ----- She has moderate MR.  I will follow in on year with another echo.  Call Ms. Vincent with the results and send results to [name], FNP

## 2015-07-09 ENCOUNTER — Telehealth: Payer: Self-pay | Admitting: Cardiology

## 2015-07-09 NOTE — Telephone Encounter (Signed)
Left message to call.

## 2015-07-09 NOTE — Telephone Encounter (Signed)
Returning call,concerning her test results.

## 2015-07-09 NOTE — Telephone Encounter (Signed)
Pt is returning [name] call  ° °Thanks  °

## 2015-07-09 NOTE — Telephone Encounter (Signed)
Spoke to pt, results acknowledged.

## 2015-07-13 ENCOUNTER — Ambulatory Visit (INDEPENDENT_AMBULATORY_CARE_PROVIDER_SITE_OTHER): Payer: Medicare Other

## 2015-07-13 DIAGNOSIS — M810 Age-related osteoporosis without current pathological fracture: Secondary | ICD-10-CM

## 2015-07-24 ENCOUNTER — Ambulatory Visit (INDEPENDENT_AMBULATORY_CARE_PROVIDER_SITE_OTHER): Payer: Medicare Other | Admitting: Nurse Practitioner

## 2015-07-24 ENCOUNTER — Encounter: Payer: Self-pay | Admitting: Nurse Practitioner

## 2015-07-24 VITALS — BP 137/55 | HR 53 | Temp 98.3°F | Ht 63.0 in | Wt 174.4 lb

## 2015-07-24 DIAGNOSIS — R1013 Epigastric pain: Secondary | ICD-10-CM

## 2015-07-24 DIAGNOSIS — I10 Essential (primary) hypertension: Secondary | ICD-10-CM

## 2015-07-24 DIAGNOSIS — I4892 Unspecified atrial flutter: Secondary | ICD-10-CM

## 2015-07-24 DIAGNOSIS — E785 Hyperlipidemia, unspecified: Secondary | ICD-10-CM

## 2015-07-24 DIAGNOSIS — M810 Age-related osteoporosis without current pathological fracture: Secondary | ICD-10-CM | POA: Diagnosis not present

## 2015-07-24 DIAGNOSIS — Z6830 Body mass index (BMI) 30.0-30.9, adult: Secondary | ICD-10-CM | POA: Diagnosis not present

## 2015-07-24 MED ORDER — VALSARTAN-HYDROCHLOROTHIAZIDE 80-12.5 MG PO TABS: 1.0000 | ORAL_TABLET | Freq: Every day | ORAL | Status: DC

## 2015-07-24 MED ORDER — ATORVASTATIN CALCIUM 40 MG PO TABS: 40.0000 mg | ORAL_TABLET | Freq: Every day | ORAL | Status: DC

## 2015-07-24 MED ORDER — AMLODIPINE BESYLATE 10 MG PO TABS: ORAL_TABLET | ORAL | Status: DC

## 2015-07-24 MED ORDER — SPIRONOLACTONE 25 MG PO TABS: 25.0000 mg | ORAL_TABLET | Freq: Every day | ORAL | Status: DC

## 2015-07-24 NOTE — Progress Notes (Signed)
Subjective:    Patient ID: Brooke Chavez, female    DOB: 12-29-1940, 74 y.o.   MRN: [id number]   Patient here today for follow up of chronic medical problems. No complaints today   Hypertension This is a chronic problem. The current episode started more than 1 year ago. The problem has been waxing and waning since onset. The problem is controlled. Pertinent negatives include no anxiety, blurred vision, chest pain, palpitations, peripheral edema or shortness of breath. There are no associated agents to hypertension. Risk factors for coronary artery disease include dyslipidemia and post-menopausal state. Past treatments include diuretics, calcium channel blockers and angiotensin blockers. The current treatment provides moderate improvement. There are no compliance problems.  There is no history of a thyroid problem. There is no history of sleep apnea.  Hyperlipidemia This is a chronic problem. The current episode started more than 1 year ago. The problem is controlled. Recent lipid tests were reviewed and are normal. She has no history of diabetes or obesity. Factors aggravating her hyperlipidemia include fatty foods. Pertinent negatives include no chest pain or shortness of breath. Current antihyperlipidemic treatment includes statins. The current treatment provides moderate improvement of lipids. Risk factors for coronary artery disease include post-menopausal, hypertension and dyslipidemia.  Osteoporosis Pt stopped taking her fosamax about 6 months ago. States she had a hard time remembering to take the medication. Atrial flutter/mitral valve regurgitation Pt states she see Dr. Casimer Lanius about a year ago and received a good report. States she is due to see him soon.  dyspepsia currently not on any rx medicine- just uses TUMS when occurs.    Review of Systems  Constitutional: Negative.   HENT: Negative.   Eyes: Negative for blurred vision.  Respiratory: Negative.  Negative for shortness of  breath.   Cardiovascular: Negative.  Negative for chest pain and palpitations.  Gastrointestinal: Negative.   Genitourinary: Negative.   Musculoskeletal: Negative.   Neurological: Negative.   All other systems reviewed and are negative.      Objective:   Physical Exam  Constitutional: She is oriented to person, place, and time. She appears well-developed and well-nourished.  HENT:  Nose: Nose normal.  Mouth/Throat: Oropharynx is clear and moist.  Eyes: EOM are normal.  Neck: Trachea normal, normal range of motion and full passive range of motion without pain. Neck supple. No JVD present. Carotid bruit is not present. No thyromegaly present.  Cardiovascular: Normal rate, normal heart sounds and intact distal pulses.  Exam reveals no gallop and no friction rub.   No murmur heard. Irregular heart beat  Pulmonary/Chest: Effort normal and breath sounds normal.  Abdominal: Soft. Bowel sounds are normal. She exhibits no distension and no mass. There is no tenderness.  Musculoskeletal: Normal range of motion.  Lymphadenopathy:    She has no cervical adenopathy.  Neurological: She is alert and oriented to person, place, and time. She has normal reflexes.  Skin: Skin is warm and dry.  Psychiatric: She has a normal mood and affect. Her behavior is normal. Judgment and thought content normal.   BP 137/55 mmHg  Pulse 53  Temp(Src) 98.3 F (36.8 C) (Oral)  Ht 5' 3"  (1.6 m)  Wt 174 lb 6.4 oz (79.107 kg)  BMI 30.90 kg/m2  LMP 11/05/1992       Assessment & Plan:  1. Essential hypertension Do ot add s;at to diet - valsartan-hydrochlorothiazide (DIOVAN-HCT) 80-12.5 MG tablet; Take 1 tablet by mouth daily.  Dispense: 30 tablet; Refill: 5 - spironolactone (ALDACTONE)  25 MG tablet; Take 1 tablet (25 mg total) by mouth daily.  Dispense: 30 tablet; Refill: 5 - amLODipine (NORVASC) 10 MG tablet; TAKE 1 TABLET (10 MG TOTAL) BY MOUTH DAILY.  Dispense: 30 tablet; Refill: 5 - CMP14+EGFR  2.  Atrial flutter, unspecified type ([place])  3. Hyperlipidemia with target LDL less than 100 Low fat diet - atorvastatin (LIPITOR) 40 MG tablet; Take 1 tablet (40 mg total) by mouth daily.  Dispense: 30 tablet; Refill: 5 - Lipid panel  4. BMI 30.0-30.9,adult Discussed diet and exercise for person with BMI >25 Will recheck weight in 3-6 months  5. Dyspepsia Avoid spicy foods Do not eat 2 hours prior to bedtime     Labs pending Health maintenance reviewed Diet and exercise encouraged Continue all meds Follow up  In 3 month   [place], FNP

## 2015-07-24 NOTE — Patient Instructions (Signed)
Hypertension Hypertension, commonly called high blood pressure, is when the force of blood pumping through your arteries is too strong. Your arteries are the blood vessels that carry blood from your heart throughout your body. A blood pressure reading consists of a higher number over a lower number, such as 110/72. The higher number (systolic) is the pressure inside your arteries when your heart pumps. The lower number (diastolic) is the pressure inside your arteries when your heart relaxes. Ideally you want your blood pressure below 120/80. Hypertension forces your heart to work harder to pump blood. Your arteries may become narrow or stiff. Having untreated or uncontrolled hypertension can cause heart attack, stroke, kidney disease, and other problems. RISK FACTORS Some risk factors for high blood pressure are controllable. Others are not.  Risk factors you cannot control include:   Race. You may be at higher risk if you are African American.  Age. Risk increases with age.  Gender. Men are at higher risk than women before age 45 years. After age 65, women are at higher risk than men. Risk factors you can control include:  Not getting enough exercise or physical activity.  Being overweight.  Getting too much fat, sugar, calories, or salt in your diet.  Drinking too much alcohol. SIGNS AND SYMPTOMS Hypertension does not usually cause signs or symptoms. Extremely high blood pressure (hypertensive crisis) may cause headache, anxiety, shortness of breath, and nosebleed. DIAGNOSIS To check if you have hypertension, your health care provider will measure your blood pressure while you are seated, with your arm held at the level of your heart. It should be measured at least twice using the same arm. Certain conditions can cause a difference in blood pressure between your right and left arms. A blood pressure reading that is higher than normal on one occasion does not mean that you need treatment. If  it is not clear whether you have high blood pressure, you may be asked to return on a different day to have your blood pressure checked again. Or, you may be asked to monitor your blood pressure at home for 1 or more weeks. TREATMENT Treating high blood pressure includes making lifestyle changes and possibly taking medicine. Living a healthy lifestyle can help lower high blood pressure. You may need to change some of your habits. Lifestyle changes may include:  Following the DASH diet. This diet is high in fruits, vegetables, and whole grains. It is low in salt, red meat, and added sugars.  Keep your sodium intake below 2,300 mg per day.  Getting at least 30-45 minutes of aerobic exercise at least 4 times per week.  Losing weight if necessary.  Not smoking.  Limiting alcoholic beverages.  Learning ways to reduce stress. Your health care provider may prescribe medicine if lifestyle changes are not enough to get your blood pressure under control, and if one of the following is true:  You are 18-59 years of age and your systolic blood pressure is above 140.  You are 60 years of age or older, and your systolic blood pressure is above 150.  Your diastolic blood pressure is above 90.  You have diabetes, and your systolic blood pressure is over 140 or your diastolic blood pressure is over 90.  You have kidney disease and your blood pressure is above 140/90.  You have heart disease and your blood pressure is above 140/90. Your personal target blood pressure may vary depending on your medical conditions, your age, and other factors. HOME CARE INSTRUCTIONS    Have your blood pressure rechecked as directed by your health care provider.   Take medicines only as directed by your health care provider. Follow the directions carefully. Blood pressure medicines must be taken as prescribed. The medicine does not work as well when you skip doses. Skipping doses also puts you at risk for  problems.  Do not smoke.   Monitor your blood pressure at home as directed by your health care provider. SEEK MEDICAL CARE IF:   You think you are having a reaction to medicines taken.  You have recurrent headaches or feel dizzy.  You have swelling in your ankles.  You have trouble with your vision. SEEK IMMEDIATE MEDICAL CARE IF:  You develop a severe headache or confusion.  You have unusual weakness, numbness, or feel faint.  You have severe chest or abdominal pain.  You vomit repeatedly.  You have trouble breathing. MAKE SURE YOU:   Understand these instructions.  Will watch your condition.  Will get help right away if you are not doing well or get worse.   This information is not intended to replace advice given to you by your health care provider. Make sure you discuss any questions you have with your health care provider.   Document Released: 07/28/2005 Document Revised: 12/12/2014 Document Reviewed: 05/20/2013 Elsevier Interactive Patient Education 2016 [employer]  

## 2015-07-25 ENCOUNTER — Ambulatory Visit (INDEPENDENT_AMBULATORY_CARE_PROVIDER_SITE_OTHER): Payer: Medicare Other | Admitting: Pharmacist

## 2015-07-25 ENCOUNTER — Encounter: Payer: Self-pay | Admitting: Pharmacist

## 2015-07-25 VITALS — BP 132/62 | HR 64 | Ht 63.0 in | Wt 170.0 lb

## 2015-07-25 DIAGNOSIS — Z0000 Encounter for general adult medical examination without abnormal findings: Secondary | ICD-10-CM

## 2015-07-25 DIAGNOSIS — E66811 Obesity, class 1: Secondary | ICD-10-CM

## 2015-07-25 DIAGNOSIS — E669 Obesity, unspecified: Secondary | ICD-10-CM

## 2015-07-25 DIAGNOSIS — H9193 Unspecified hearing loss, bilateral: Secondary | ICD-10-CM

## 2015-07-25 DIAGNOSIS — M810 Age-related osteoporosis without current pathological fracture: Secondary | ICD-10-CM

## 2015-07-25 LAB — LIPID PANEL
Chol/HDL Ratio: 2.7 ratio units (ref 0.0–4.4)
Cholesterol, Total: 176 mg/dL (ref 100–199)
HDL: 65 mg/dL (ref >39)
LDL Calculated: 95 mg/dL (ref 0–99)
Triglycerides: 80 mg/dL (ref 0–149)
VLDL Cholesterol Cal: 16 mg/dL (ref 5–40)

## 2015-07-25 LAB — CMP14+EGFR
ALT: 9 IU/L (ref 0–32)
AST: 22 IU/L (ref 0–40)
Albumin/Globulin Ratio: 1.5 (ref 1.1–2.5)
Albumin: 4.3 g/dL (ref 3.5–4.8)
Alkaline Phosphatase: 67 IU/L (ref 39–117)
BUN/Creatinine Ratio: 10 — ABNORMAL LOW (ref 11–26)
BUN: 16 mg/dL (ref 8–27)
Bilirubin Total: 0.5 mg/dL (ref 0.0–1.2)
CO2: 25 mmol/L (ref 18–29)
Calcium: 9.9 mg/dL (ref 8.7–10.3)
Chloride: 105 mmol/L (ref 96–106)
Creatinine, Ser: 1.54 mg/dL — ABNORMAL HIGH (ref 0.57–1.00)
GFR calc Af Amer: 38 mL/min/{1.73_m2} — ABNORMAL LOW (ref >59)
GFR calc non Af Amer: 33 mL/min/{1.73_m2} — ABNORMAL LOW (ref >59)
Globulin, Total: 2.8 g/dL (ref 1.5–4.5)
Glucose: 98 mg/dL (ref 65–99)
Potassium: 5 mmol/L (ref 3.5–5.2)
Sodium: 147 mmol/L — ABNORMAL HIGH (ref 134–144)
Total Protein: 7.1 g/dL (ref 6.0–8.5)

## 2015-07-25 MED ORDER — RISEDRONATE SODIUM 35 MG PO TBEC: 1.0000 | DELAYED_RELEASE_TABLET | ORAL | Status: DC

## 2015-07-25 NOTE — Patient Instructions (Signed)
Brooke Chavez , Thank you for taking time to come for your Medicare Wellness Visit. I appreciate your ongoing commitment to your health goals. Please review the following plan we discussed and let me know if I can assist you in the future.   These are the goals we discussed: We are adding a vitamin D level to your labs that were checked yesterday Try almond milk - less likely to bother stomach that regular milk Start calcium supplement - 500mg  once a day.  Try to exercise daily - goal is 150 minutes of exercise per week Sending referral for hearing check / Dr Romeo Rabon but you can also call [facility] if  You prefer [phone] Remember to make appointment for mammogram   This is a list of the screening recommended for you and due dates:  Health Maintenance  Topic Date Due  . Shingles Vaccine  10/09/2015*  . Pneumonia vaccines (1 of 2 - PCV13) 10/09/2015*  . Flu Shot  11/09/2015*  . Mammogram  08/08/2015  . Stool Blood Test  10/23/2015  . Pap Smear  09/19/2016  . Colon Cancer Screening  10/18/2017  . Tetanus Vaccine  04/11/2021  . DEXA scan (bone density measurement)  07/2017  *Topic was postponed. The date shown is not the original due date.     Calcium & Vitamin D: The Facts  Why is calcium and vitamin D consumption important? Calcium: . Most Americans do not consume adequate amounts of calcium! Calcium is required for proper muscle function, nerve communication, bone support, and many other functions in the body.  . The body uses bones as a source of calcium. Bones 'remodel' themselves continuously - the body constantly breaks bone down to release calcium and rebuilds bones by replacing calcium in the bone later.  . As we get older, the rate of bone breakdown occurs faster than bone rebuilding which could lead to osteopenia, osteoporosis, and possible fractures.   Vitamin D: . People naturally make vitamin D in the body when sunlight hits the skin and triggers a process that  leads to vitamin D production. This natural vitamin D production requires about 10-15 minutes of sun exposure on the hands, arms, and face at least 2-3 times per week. However, due to decreased sun exposure and the use of sunscreen, most people will need to get additional vitamin D from foods or supplements. Your doctor can measure your body's vitamin D level through a simple blood test to determine your daily vitamin D needs.  . Vitamin D is used to help the body absorb calcium, maintain bone health, help the immune system, and reduce inflammation. It also plays a role in muscle performance, balance and risk of falling.  . Vitamin D deficiency can lead to osteomalacia or softening of the bones, bone pain, and muscle weakness.   The recommended daily allowance of Calcium and Vitamin D varies for different age groups. Age group Calcium (mg) Vitamin D (IU)  Females and Males: Age 43-50 1000 mg 600 IU  Females: Age 49- 45 1200 mg 600 IU  Males: Age 49-70 1000 mg 600 IU  Females and Males: Age 57+ 1200 mg 800 IU  Pregnant/lactating Females age 49-50 1000 mg 600 IU   How much Calcium do you get in your diet? Calcium Intake # of servings per day  Total calcium (mg)  Skim milk, 2% milk (1 cup) _________ x 300 mg   Yogurt (1 small container) _________ x 200 mg   Cheese (1oz) _________  x 200 mg   Cottage Cheese (1 cup)             ________ x 150 mg   Almond milk (1 cup) _________ x 450 mg   Fortified Orange Juice (1 cup) _________ x 300 mg   Broccoli or spinach ( 1 cup) _________ x 100 mg   Salmon (3 oz) _________ x 150 mg    Almonds (1/4 cup) _______ x 90 mg      How do we get Calcium and Vitamin D in our diet? Calcium: . Obtaining calcium from the diet is the most preferred way to reach the recommended daily goal. If this goal is not reached through diet, calcium supplements are available.  . Calcium is found in many foods including: dairy products, dark leafy vegetables (like broccoli, kale,  and spinach), fish, and fortified products like juices and cereals.  . The food label will have a %DV (percent daily value) listed showing the amount of calcium per serving. To determine the total mg per serving, simply replace the % with zero (0).  For example, Almond Breeze almond milk contains 45% DV of calcium or 450mg  per 1 cup.  . You can increase the amount of calcium in your diet by using more calcium products in your daily meals. Use yogurt and fruit to make smoothies or use yogurt to top baked potatoes or make whipped potatoes. Sprinkle low fat cheese onto salads or into egg white omelets. You can even add non-fat dry milk powder (300mg  calcium per 1/3 cup) to hot cereals, meat loaf, soups, or potatoes.  . Calcium supplements come in many forms including tablets, chewables, and gummies. Be sure to read the label to determine the correct number of tablets per serving and whether or not to take the supplement with food.  . Calcium carbonate products (Oscal, Caltrate, and Viactiv) are generally better absorbed when taken with food while calcium citrate products like Citracal can be taken with or without food.  . The body can only absorb about 600 mg of calcium at one time. It is recommended to take calcium supplements in small amounts several times per day.  However, taking it all at once is better than not taking it at all. . Increasing your intake of calcium is essential for bone health, but may also lead to some side effects like constipation, increased gas, bloating or abdominal cramping. To help reduce these side effects, start with 1 tablet per day and slowly increase your intake of the supplement to the recommended doses. It is also recommended that you drink plenty of water each day. Vitamin D: . Very few foods naturally contain vitamin D. However, it is found in saltwater fish (like tuna, salmon and mackerel), beef liver, egg yolks, cheese and vitamin D fortified foods (like yogurt, cereals,  orange juice and milk) . The amount of vitamin D in each food or product is listed as %DV on the product label. To determine the total amount of vitamin D per serving, drop the % sign and multiply the number by 4. For example, 1 cup of Almond Breeze almond milk contains 25% DV vitamin D or 100 IU per serving (25 x 4 =100). . Vitamin D is also found in multivitamins and supplements and may be listed as ergocalciferol (vitamin D2) or cholecalciferol (vitamin D3). Each of these forms of vitamin D are equivalent and the daily recommended intake will vary based on your age and the vitamin D levels in your body.  Follow your doctor's recommendation for vitamin D intake.       Fall Prevention in the Home  Falls can cause injuries and can affect people from all age groups. There are many simple things that you can do to make your home safe and to help prevent falls. WHAT CAN I DO ON THE OUTSIDE OF MY HOME?  Regularly repair the edges of walkways and driveways and fix any cracks.  Remove high doorway thresholds.  Trim any shrubbery on the main path into your home.  Use bright outdoor lighting.  Clear walkways of debris and clutter, including tools and rocks.  Regularly check that handrails are securely fastened and in good repair. Both sides of any steps should have handrails.  Install guardrails along the edges of any raised decks or porches.  Have leaves, snow, and ice cleared regularly.  Use sand or salt on walkways during winter months.  In the garage, clean up any spills right away, including grease or oil spills. WHAT CAN I DO IN THE BATHROOM?  Use night lights.  Install grab bars by the toilet and in the tub and shower. Do not use towel bars as grab bars.  Use non-skid mats or decals on the floor of the tub or shower.  If you need to sit down while you are in the shower, use a plastic, non-slip stool.Marland Kitchen  Keep the floor dry. Immediately clean up any water that spills on the  floor.  Remove soap buildup in the tub or shower on a regular basis.  Attach bath mats securely with double-sided non-slip rug tape.  Remove throw rugs and other tripping hazards from the floor. WHAT CAN I DO IN THE BEDROOM?  Use night lights.  Make sure that a bedside light is easy to reach.  Do not use oversized bedding that drapes onto the floor.  Have a firm chair that has side arms to use for getting dressed.  Remove throw rugs and other tripping hazards from the floor. WHAT CAN I DO IN THE KITCHEN?   Clean up any spills right away.  Avoid walking on wet floors.  Place frequently used items in easy-to-reach places.  If you need to reach for something above you, use a sturdy step stool that has a grab bar.  Keep electrical cables out of the way.  Do not use floor polish or wax that makes floors slippery. If you have to use wax, make sure that it is non-skid floor wax.  Remove throw rugs and other tripping hazards from the floor. WHAT CAN I DO IN THE STAIRWAYS?  Do not leave any items on the stairs.  Make sure that there are handrails on both sides of the stairs. Fix handrails that are broken or loose. Make sure that handrails are as long as the stairways.  Check any carpeting to make sure that it is firmly attached to the stairs. Fix any carpet that is loose or worn.  Avoid having throw rugs at the top or bottom of stairways, or secure the rugs with carpet tape to prevent them from moving.  Make sure that you have a light switch at the top of the stairs and the bottom of the stairs. If you do not have them, have them installed. WHAT ARE SOME OTHER FALL PREVENTION TIPS?  Wear closed-toe shoes that fit well and support your feet. Wear shoes that have rubber soles or low heels.  When you use a stepladder, make sure that it is completely opened  and that the sides are firmly locked. Have someone hold the ladder while you are using it. Do not climb a closed  stepladder.  Add color or contrast paint or tape to grab bars and handrails in your home. Place contrasting color strips on the first and last steps.  Use mobility aids as needed, such as canes, walkers, scooters, and crutches.  Turn on lights if it is dark. Replace any light bulbs that burn out.  Set up furniture so that there are clear paths. Keep the furniture in the same spot.  Fix any uneven floor surfaces.  Choose a carpet design that does not hide the edge of steps of a stairway.  Be aware of any and all pets.  Review your medicines with your healthcare provider. Some medicines can cause dizziness or changes in blood pressure, which increase your risk of falling. Talk with your health care provider about other ways that you can decrease your risk of falls. This may include working with a physical therapist or trainer to improve your strength, balance, and endurance.   This information is not intended to replace advice given to you by your health care provider. Make sure you discuss any questions you have with your health care provider.   Document Released: 07/18/2002 Document Revised: 12/12/2014 Document Reviewed: 09/01/2014 Elsevier Interactive Patient Education [employer].

## 2015-07-25 NOTE — Progress Notes (Signed)
Patient ID: Brooke Chavez, female   DOB: February 24, 1941, 74 y.o.   MRN: [id number]   Subjective:   Brooke Chavez is a 74 y.o. black, female who presents for an Initial Medicare Annual Wellness Visit and to review DEXA results.  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  She is widowed, her husband died in 55.  Her daughter and grandson live with her.   Patient has history of left femur fracture in 2013. She took alendronate for about 2-3 months but stopped because she was having trouble remembering to take.   Review of Systems  Review of Systems  Constitutional: Negative.   HENT: Positive for hearing loss. Negative for congestion, ear discharge, ear pain, nosebleeds, sore throat and tinnitus.   Eyes: Negative.   Respiratory: Negative.  Negative for stridor.   Cardiovascular: Negative.   Gastrointestinal: Negative.   Genitourinary: Negative.   Musculoskeletal: Negative.   Skin: Negative.   Neurological: Negative.  Negative for headaches.  Endo/Heme/Allergies: Negative.   Psychiatric/Behavioral: Negative.      Current Medications (verified) Outpatient Encounter Prescriptions as of 07/25/2015  Medication Sig  . acetaminophen (TYLENOL) 325 MG tablet Take 325 mg by mouth every 6 (six) hours as needed. For pain     . amLODipine (NORVASC) 10 MG tablet TAKE 1 TABLET (10 MG TOTAL) BY MOUTH DAILY.  Marland Kitchen atorvastatin (LIPITOR) 40 MG tablet Take 1 tablet (40 mg total) by mouth daily.  Marland Kitchen spironolactone (ALDACTONE) 25 MG tablet Take 1 tablet (25 mg total) by mouth daily.  . valsartan-hydrochlorothiazide (DIOVAN-HCT) 80-12.5 MG tablet Take 1 tablet by mouth daily.   No facility-administered encounter medications on file as of 07/25/2015.    Allergies (verified) Aspirin and Oxycodone-acetaminophen   History: Past Medical History  Diagnosis Date  . Hypertension     Since 1997   . Hyperlipidemia     x5 years  . Mitral regurgitation     Mild to moderate  . Bradycardia   . 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 Atrial flutter ([place])     Typical by EKG diagnosis 9/11 s/p CIT ablation 11/11  . DJD (degenerative joint disease)   . Femur fracture, left ([place]) 2013  . Osteopenia    Past Surgical History  Procedure Laterality Date  . Knee arthroscopy      Right  . Tubal ligation    . Lumbar spine surgery    . Total knee arthroplasty      Left  . Atrial ablation surgery  06/2010  . Hip surgery      Left (fracture) 3/13  . A flutter ablation N/A 06/27/2011    Procedure: ABLATION A FLUTTER;  Surgeon: Thompson Grayer, MD;  Location: [facility] CATH LAB;  Service: Cardiovascular;  Laterality: N/A;   Family History  Problem Relation Age of Onset  . Hypertension Mother   . Cancer Sister     leukemia  . Diabetes Brother   . Diabetes Brother   . Heart attack Brother    Social History   Occupational History  . Not on file.   Social History Main Topics  . Smoking status: Never Smoker   . Smokeless tobacco: Never Used  . Alcohol Use: No  . Drug Use: No  . Sexual Activity: No    Do you feel safe at home?  Yes  Dietary issues and exercise activities: Current Exercise Habits:: The patient does not participate in regular exercise at present  Current Dietary habits:  Patient limits fried and high fat foods;  She avoids milks most days and she is lactose intolerant.  She reports that she eats lots of green leafy vegetables.   Objective:    Today's Vitals   07/25/15 0906  BP: 132/62  Pulse: 64  Height: 5\' 3"  (1.6 m)  Weight: 170 lb (77.111 kg)  PainSc: 0-No pain   Body mass index is 30.12 kg/(m^2).    DEXA Results Date of Test T-Score for AP Spine L1-L4 T-Score for Total Left Hip T-Score for Total Right Hip  07/13/2015 0.2 -- -1.5  09/29/2012 -1.0 -- -1.8  12/12/2009 -0.7 -- -1.4  05/11/2006 -1.2 -- -1.5   FRAX 10 year  estimate: Total FX risk:  18%  (consider medication if >/= 20%) Hip FX risk:  3.6%  (consider medication if >/= 3%)  Activities of Daily Living In your present state of health, do you have any difficulty performing the following activities: 07/25/2015  Hearing? Y  Vision? N  Difficulty concentrating or making decisions? N  Walking or climbing stairs? N  Dressing or bathing? N  Doing errands, shopping? N  Preparing Food and eating ? N  Using the Toilet? N  In the past six months, have you accidently leaked urine? N  Do you have problems with loss of bowel control? N  Managing your Medications? N  Managing your Finances? N  Housekeeping or managing your Housekeeping? N    Are there smokers in your home (other than you)? No   Cardiac Risk Factors include: advanced age (>77men, >28 women);dyslipidemia;family history of premature cardiovascular disease;hypertension;obesity (BMI >30kg/m2);sedentary lifestyle  Depression Screen PHQ 2/9 Scores 07/25/2015 07/24/2015 04/10/2015 09/19/2014  PHQ - 2 Score 0 0 0 0    Fall Risk Fall Risk  07/25/2015 07/24/2015 04/10/2015 09/19/2014 06/16/2014  Falls in the past year? No No No No No    Cognitive Function: No flowsheet data found.  Immunizations and Health Maintenance  There is no immunization history on file for this patient. There are no preventive care reminders to display for this patient.  Patient Care Team: [name], FNP as PCP - General (Nurse Practitioner)  Indicate any recent Medical Services you may have received from other than [facility] providers in the past year (date may be approximate).    Assessment:    Annual Wellness Visit  Osteoporosis - low BMD with history of femur fracture HTN - at goal today Hard of hearing Obesity - class 1   Screening Tests Health Maintenance  Topic Date Due  . ZOSTAVAX  10/09/2015 (Originally 08/29/2000)  . PNA vac Low Risk Adult (1 of 2 - PCV13) 10/09/2015 (Originally 08/29/2005)   . INFLUENZA VACCINE  11/09/2015 (Originally 03/12/2015)  . MAMMOGRAM  08/08/2015  . COLON CANCER SCREENING ANNUAL FOBT  10/23/2015  . PAP SMEAR  09/19/2016  . COLONOSCOPY  10/18/2017  . TETANUS/TDAP  04/11/2021  . DEXA SCAN  Completed        Plan:   During the course of the visit Kaliya was educated and counseled about the following appropriate screening and preventive services:   Vaccines to include Pneumoccal, Influenza, Hepatitis B, Td, Zostavax - patient declined all recommended vaccines  Colorectal cancer screening - colonoscopy and FOBT are both UTD  Cardiovascular disease screening - BP at goal.  Patient is taking statin  Diabetes screening - last FBG  was 81  Bone Denisty / Osteoporosis Screening - Dexa done 07/13/2015.   Recommend start Atelvia (generic) 35mg  take 1 tablet daily - its ok to take with food and should also take with 4 to 6 ounces of water.   Mammogram - due in next month - patient has appt.   PAP - UTD  Glaucoma screening / Diabetic Eye Exam - UTD  Nutrition counseling - increase calcium intake from diet.  Discussed content of calcium of a variety of foods.  Patient to also start calcium 500mg  daily.  Try almond milk since unable to drink regular milk due to glucose intolerance  Advanced Directives  recommend weight bearing exercise - 30 minutes at least 4 days per week.    Counseled and educated about fall risk and prevention.  Referral to reassess hearing with Dr Romeo Rabon (I also gave patient number for [facility] because she has seen commercial and would like to call them to check price of visit)  Added vitamin D level to labs checked yesterday  Recheck DEXA:  2 years  Time spent counseling patient:  60 minutes Goals    None       Patient Instructions (the written plan) were given to the patient.   Cherre Robins, [facility]   07/25/2015

## 2015-07-27 LAB — SPECIMEN STATUS REPORT

## 2015-07-27 LAB — VITAMIN D 25 HYDROXY (VIT D DEFICIENCY, FRACTURES): Vit D, 25-Hydroxy: 20.1 ng/mL — ABNORMAL LOW (ref 30.0–100.0)

## 2015-08-02 ENCOUNTER — Encounter: Payer: Self-pay | Admitting: *Deleted

## 2015-09-25 DIAGNOSIS — Z96652 Presence of left artificial knee joint: Secondary | ICD-10-CM | POA: Diagnosis not present

## 2015-09-25 DIAGNOSIS — Z96653 Presence of artificial knee joint, bilateral: Secondary | ICD-10-CM | POA: Diagnosis not present

## 2015-09-25 DIAGNOSIS — Z96651 Presence of right artificial knee joint: Secondary | ICD-10-CM | POA: Diagnosis not present

## 2015-09-25 DIAGNOSIS — Z471 Aftercare following joint replacement surgery: Secondary | ICD-10-CM | POA: Diagnosis not present

## 2015-11-02 DIAGNOSIS — H2513 Age-related nuclear cataract, bilateral: Secondary | ICD-10-CM | POA: Diagnosis not present

## 2016-01-18 DIAGNOSIS — H18411 Arcus senilis, right eye: Secondary | ICD-10-CM | POA: Diagnosis not present

## 2016-01-18 DIAGNOSIS — H2511 Age-related nuclear cataract, right eye: Secondary | ICD-10-CM | POA: Diagnosis not present

## 2016-01-18 DIAGNOSIS — H18412 Arcus senilis, left eye: Secondary | ICD-10-CM | POA: Diagnosis not present

## 2016-01-18 DIAGNOSIS — H2512 Age-related nuclear cataract, left eye: Secondary | ICD-10-CM | POA: Diagnosis not present

## 2016-01-28 ENCOUNTER — Telehealth: Payer: Self-pay | Admitting: Nurse Practitioner

## 2016-01-29 ENCOUNTER — Telehealth: Payer: Self-pay | Admitting: *Deleted

## 2016-01-29 NOTE — Telephone Encounter (Signed)
The patient can have cataract surgery.

## 2016-01-29 NOTE — Telephone Encounter (Signed)
Requesting surgical clearance:   1. Type of surgery: Cataract extraction w/intraocular Lens implantation of Right and Left eye  2. Surgeon: Unknown  3. Surgical date: 02/08/2016  4. Medications that need to be help: NONE  5. [facility]: Mamie [phone]  Pt saw you last November, Is pt cleared for surgery?

## 2016-01-31 NOTE — Telephone Encounter (Signed)
Clearance faxed to [facility] via faxed machine

## 2016-02-08 DIAGNOSIS — H2512 Age-related nuclear cataract, left eye: Secondary | ICD-10-CM | POA: Diagnosis not present

## 2016-02-08 DIAGNOSIS — H2511 Age-related nuclear cataract, right eye: Secondary | ICD-10-CM | POA: Diagnosis not present

## 2016-02-08 DIAGNOSIS — H2513 Age-related nuclear cataract, bilateral: Secondary | ICD-10-CM | POA: Diagnosis not present

## 2016-02-08 DIAGNOSIS — H25012 Cortical age-related cataract, left eye: Secondary | ICD-10-CM | POA: Diagnosis not present

## 2016-02-14 ENCOUNTER — Ambulatory Visit (INDEPENDENT_AMBULATORY_CARE_PROVIDER_SITE_OTHER): Payer: Medicare Other | Admitting: Nurse Practitioner

## 2016-02-14 ENCOUNTER — Encounter: Payer: Self-pay | Admitting: Nurse Practitioner

## 2016-02-14 VITALS — BP 129/63 | HR 73 | Temp 97.9°F | Ht 63.0 in | Wt 167.8 lb

## 2016-02-14 DIAGNOSIS — Z1212 Encounter for screening for malignant neoplasm of rectum: Secondary | ICD-10-CM | POA: Diagnosis not present

## 2016-02-14 DIAGNOSIS — Z9289 Personal history of other medical treatment: Secondary | ICD-10-CM

## 2016-02-14 DIAGNOSIS — I10 Essential (primary) hypertension: Secondary | ICD-10-CM | POA: Diagnosis not present

## 2016-02-14 DIAGNOSIS — E785 Hyperlipidemia, unspecified: Secondary | ICD-10-CM

## 2016-02-14 DIAGNOSIS — Z6830 Body mass index (BMI) 30.0-30.9, adult: Secondary | ICD-10-CM | POA: Diagnosis not present

## 2016-02-14 DIAGNOSIS — R1013 Epigastric pain: Secondary | ICD-10-CM

## 2016-02-14 MED ORDER — AMLODIPINE BESYLATE 10 MG PO TABS: ORAL_TABLET | ORAL | Status: DC

## 2016-02-14 MED ORDER — VALSARTAN-HYDROCHLOROTHIAZIDE 80-12.5 MG PO TABS: 1.0000 | ORAL_TABLET | Freq: Every day | ORAL | Status: DC

## 2016-02-14 MED ORDER — ATORVASTATIN CALCIUM 40 MG PO TABS: 40.0000 mg | ORAL_TABLET | Freq: Every day | ORAL | Status: DC

## 2016-02-14 MED ORDER — SPIRONOLACTONE 25 MG PO TABS: 25.0000 mg | ORAL_TABLET | Freq: Every day | ORAL | Status: DC

## 2016-02-14 NOTE — Patient Instructions (Signed)
Indigestion Indigestion is a feeling of pain, discomfort, burning, or fullness in the upper part of your belly (abdomen). It can come and go. It may occur often or rarely. Indigestion tends to happen while you are eating or right after you have finished eating. It may be worse at night and while bending over or lying down. HOME CARE Take these actions to lessen your pain or discomfort and to help avoid problems. Diet  Follow a diet as told by your doctor. You may need to avoid foods and drinks such as:  Coffee and tea (with or without caffeine).  Drinks that contain alcohol.  Energy drinks and sports drinks.  Carbonated drinks or sodas.  Chocolate and cocoa.  Peppermint and mint flavorings.  Garlic and onions.  Horseradish.  Spicy and acidic foods, such as peppers, chili powder, curry powder, vinegar, hot sauces, and BBQ sauce.  Citrus fruit juices and citrus fruits, such as oranges, lemons, and limes.  Tomato-based foods, such as red sauce, chili, salsa, and pizza with red sauce.  Fried and fatty foods, such as donuts, french fries, potato chips, and high-fat dressings.  High-fat meats, such as hot dogs, rib eye steak, sausage, ham, and bacon.  High-fat dairy items, such as whole milk, butter, and cream cheese.  Eat small meals often. Avoid eating large meals.  Avoid drinking large amounts of liquid with your meals.  Avoid eating meals during the 2-3 hours before bedtime.  Avoid lying down right after you eat.  Do not exercise right after you eat. General Instructions  Pay attention to any changes in your symptoms.  Take over-the-counter and prescription medicines only as told by your doctor. Do not take aspirin, ibuprofen, or other NSAIDs unless your doctor says it is okay.  Do not use any tobacco products, including cigarettes, chewing tobacco, and e-cigarettes. If you need help quitting, ask your doctor.  Wear loose clothes. Do not wear anything tight around  your waist.  Raise (elevate) the head of your bed about 6 inches (15 cm).  Try to lower your stress. If you need help doing this, ask your doctor.  If you are overweight, lose an amount of weight that is healthy for you. Ask your doctor about a safe weight loss goal.  Keep all follow-up visits as told by your doctor. This is important. GET HELP IF:  You have new symptoms.  You lose weight and you do not know why it is happening.  You have trouble swallowing, or it hurts to swallow.  Your symptoms do not get better with treatment.  Your symptoms last for more than two days.  You have a fever.  You throw up (vomit). GET HELP RIGHT AWAY IF:  You have pain in your arms, neck, jaw, teeth, or back.  You feel sweaty, dizzy, or light-headed.  You pass out (faint).  You have chest pain or shortness of breath.  You cannot stop throwing up, or you throw up blood.  Your poop (stool) is bloody or black.  You have very bad pain in your belly.   This information is not intended to replace advice given to you by your health care provider. Make sure you discuss any questions you have with your health care provider.   Document Released: 08/30/2010 Document Revised: 04/18/2015 Document Reviewed: 11/22/2014 Elsevier Interactive Patient Education [employer].

## 2016-02-14 NOTE — Progress Notes (Signed)
Subjective:    Patient ID: Brooke Chavez, female    DOB: 1940/08/18, 75 y.o.   MRN: [id number]   Patient here today for follow up of chronic medical problems.  Outpatient Encounter Prescriptions as of 02/14/2016  Medication Sig  . acetaminophen (TYLENOL) 325 MG tablet Take 325 mg by mouth every 6 (six) hours as needed. For pain     . amLODipine (NORVASC) 10 MG tablet TAKE 1 TABLET (10 MG TOTAL) BY MOUTH DAILY.  Marland Kitchen atorvastatin (LIPITOR) 40 MG tablet Take 1 tablet (40 mg total) by mouth daily.  Marland Kitchen spironolactone (ALDACTONE) 25 MG tablet Take 1 tablet (25 mg total) by mouth daily.  . valsartan-hydrochlorothiazide (DIOVAN-HCT) 80-12.5 MG tablet Take 1 tablet by mouth daily.         Hypertension This is a chronic problem. The current episode started more than 1 year ago. The problem has been waxing and waning since onset. The problem is controlled. Pertinent negatives include no anxiety, blurred vision, chest pain, palpitations, peripheral edema or shortness of breath. There are no associated agents to hypertension. Risk factors for coronary artery disease include dyslipidemia and post-menopausal state. Past treatments include diuretics, calcium channel blockers and angiotensin blockers. The current treatment provides moderate improvement. There are no compliance problems.  There is no history of a thyroid problem. There is no history of sleep apnea.  Hyperlipidemia This is a chronic problem. The current episode started more than 1 year ago. The problem is controlled. Recent lipid tests were reviewed and are normal. She has no history of diabetes or obesity. Factors aggravating her hyperlipidemia include fatty foods. Pertinent negatives include no chest pain or shortness of breath. Current antihyperlipidemic treatment includes statins. The current treatment provides moderate improvement of lipids. Risk factors for coronary artery disease include post-menopausal, hypertension and dyslipidemia.   Osteoporosis Pt stopped taking her fosamax about 6 months ago. States she had a hard time remembering to take the medication. Atrial flutter/mitral valve regurgitation Pt states she see Dr. Casimer Lanius about a year ago and received a good report. States she is due to see him soon.  dyspepsia currently not on any rx medicine- just uses TUMS when occurs.    Review of Systems  Constitutional: Negative.   HENT: Negative.   Eyes: Negative for blurred vision.  Respiratory: Negative.  Negative for shortness of breath.   Cardiovascular: Negative.  Negative for chest pain and palpitations.  Gastrointestinal: Negative.   Genitourinary: Negative.   Musculoskeletal: Negative.   Neurological: Negative.   All other systems reviewed and are negative.      Objective:   Physical Exam  Constitutional: She is oriented to person, place, and time. She appears well-developed and well-nourished.  HENT:  Nose: Nose normal.  Mouth/Throat: Oropharynx is clear and moist.  Eyes: EOM are normal.  Neck: Trachea normal, normal range of motion and full passive range of motion without pain. Neck supple. No JVD present. Carotid bruit is not present. No thyromegaly present.  Cardiovascular: Normal rate, normal heart sounds and intact distal pulses.  Exam reveals no gallop and no friction rub.   No murmur heard. Irregular heart beat  Pulmonary/Chest: Effort normal and breath sounds normal.  Abdominal: Soft. Bowel sounds are normal. She exhibits no distension and no mass. There is no tenderness.  Musculoskeletal: Normal range of motion.  Lymphadenopathy:    She has no cervical adenopathy.  Neurological: She is alert and oriented to person, place, and time. She has normal reflexes.  Skin: Skin is  warm and dry.  Psychiatric: She has a normal mood and affect. Her behavior is normal. Judgment and thought content normal.   BP 129/63 mmHg  Pulse 73  Temp(Src) 97.9 F (36.6 C) (Oral)  Ht 5' 3"  (1.6 m)  Wt 167 lb  12.8 oz (76.114 kg)  BMI 29.73 kg/m2  LMP 11/05/1992       Assessment & Plan:  1. Essential hypertension Do not add salt o diet - CMP14+EGFR - valsartan-hydrochlorothiazide (DIOVAN-HCT) 80-12.5 MG tablet; Take 1 tablet by mouth daily.  Dispense: 30 tablet; Refill: 5 - spironolactone (ALDACTONE) 25 MG tablet; Take 1 tablet (25 mg total) by mouth daily.  Dispense: 30 tablet; Refill: 5 - amLODipine (NORVASC) 10 MG tablet; TAKE 1 TABLET (10 MG TOTAL) BY MOUTH DAILY.  Dispense: 30 tablet; Refill: 5  2. Hyperlipidemia with target LDL less than 100 Low fat diet - Lipid panel - atorvastatin (LIPITOR) 40 MG tablet; Take 1 tablet (40 mg total) by mouth daily.  Dispense: 30 tablet; Refill: 5  3. BMI 30.0-30.9,adult Discussed diet and exercise for person with BMI >25 Will recheck weight in 3-6 months   4. Dyspepsia Watch greasy food in diet  5. Screening for malignant neoplasm of the rectum - Fecal occult blood, imunochemical; Future  6. Hx of mammogram - MM Digital Screening; Future    Labs pending Health maintenance reviewed Diet and exercise encouraged Continue all meds Follow up  In 6 month   [place], FNP

## 2016-02-15 LAB — CMP14+EGFR
ALT: 12 IU/L (ref 0–32)
AST: 21 IU/L (ref 0–40)
Albumin/Globulin Ratio: 1.5 (ref 1.2–2.2)
Albumin: 4.2 g/dL (ref 3.5–4.8)
Alkaline Phosphatase: 66 IU/L (ref 39–117)
BUN/Creatinine Ratio: 13 (ref 12–28)
BUN: 19 mg/dL (ref 8–27)
Bilirubin Total: 0.4 mg/dL (ref 0.0–1.2)
CO2: 21 mmol/L (ref 18–29)
Calcium: 9.6 mg/dL (ref 8.7–10.3)
Chloride: 103 mmol/L (ref 96–106)
Creatinine, Ser: 1.41 mg/dL — ABNORMAL HIGH (ref 0.57–1.00)
GFR calc Af Amer: 42 mL/min/{1.73_m2} — ABNORMAL LOW (ref >59)
GFR calc non Af Amer: 36 mL/min/{1.73_m2} — ABNORMAL LOW (ref >59)
Globulin, Total: 2.8 g/dL (ref 1.5–4.5)
Glucose: 87 mg/dL (ref 65–99)
Potassium: 4.3 mmol/L (ref 3.5–5.2)
Sodium: 143 mmol/L (ref 134–144)
Total Protein: 7 g/dL (ref 6.0–8.5)

## 2016-02-15 LAB — LIPID PANEL
Chol/HDL Ratio: 2.8 ratio units (ref 0.0–4.4)
Cholesterol, Total: 168 mg/dL (ref 100–199)
HDL: 61 mg/dL (ref >39)
LDL Calculated: 89 mg/dL (ref 0–99)
Triglycerides: 89 mg/dL (ref 0–149)
VLDL Cholesterol Cal: 18 mg/dL (ref 5–40)

## 2016-03-03 DIAGNOSIS — H2512 Age-related nuclear cataract, left eye: Secondary | ICD-10-CM | POA: Diagnosis not present

## 2016-03-03 DIAGNOSIS — H2513 Age-related nuclear cataract, bilateral: Secondary | ICD-10-CM | POA: Diagnosis not present

## 2016-06-03 ENCOUNTER — Encounter: Payer: Medicare Other | Admitting: *Deleted

## 2016-06-03 DIAGNOSIS — Z1231 Encounter for screening mammogram for malignant neoplasm of breast: Secondary | ICD-10-CM | POA: Diagnosis not present

## 2016-07-02 ENCOUNTER — Encounter: Payer: Self-pay | Admitting: *Deleted

## 2016-07-06 NOTE — Progress Notes (Signed)
HPI The patient presents for follow up of atrial flutter.   She is s/p ablation and redo ablation.   Post procedure she had some paroxysms of probable fibrillation though these were very brief period.  She has had lots of atrial ectopy and runs of atrial tachycardia. However, she had no sustained tachyarrhythmias. She had some bradycardic episodes but no sustained bradycardia arrhythmias. There were some junctional beats.  We have managed her medically for this.  I did order an echo last year when I saw her to follow up MR.  This was moderate.   Since I last saw her she has done well.  The patient denies any new symptoms such as chest discomfort, neck or arm discomfort. There has been no new shortness of breath, PND or orthopnea. There have been no reported palpitations, presyncope or syncope.    Allergies  Allergen Reactions  . Aspirin Nausea And Vomiting  . Oxycodone-Acetaminophen Anxiety    Hallucinations    Current Outpatient Prescriptions  Medication Sig Dispense Refill  . acetaminophen (TYLENOL) 325 MG tablet Take 325 mg by mouth every 6 (six) hours as needed. For pain       . amLODipine (NORVASC) 10 MG tablet TAKE 1 TABLET (10 MG TOTAL) BY MOUTH DAILY. 30 tablet 5  . atorvastatin (LIPITOR) 40 MG tablet Take 1 tablet (40 mg total) by mouth daily. 30 tablet 5  . spironolactone (ALDACTONE) 25 MG tablet Take 1 tablet (25 mg total) by mouth daily. 30 tablet 5  . valsartan-hydrochlorothiazide (DIOVAN-HCT) 80-12.5 MG tablet Take 1 tablet by mouth daily. 30 tablet 5   No current facility-administered medications for this visit.     Past Medical History:  Diagnosis Date  . Atrial flutter ([place])    Typical by EKG diagnosis 9/11 s/p CIT ablation 11/11  . Bradycardia   . DJD (degenerative joint disease)   . Femur fracture, left ([place]) 2013  . Hyperlipidemia    x5 years  . Hypertension    Since 1997  . Mitral regurgitation    Mild to moderate  . 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 A FLUTTER ABLATION N/A 06/27/2011   Procedure: ABLATION A FLUTTER;  Surgeon: Thompson Grayer, MD;  Location: [facility] C[facility] LAB;  Service: Cardiovascular;  Laterality: N/A;  . ATRIAL ABLATION SURGERY  06/2010  . HIP SURGERY     Left (fracture) 3/13  . KNEE ARTHROSCOPY     Right  . LUMBAR SPINE SURGERY    . TOTAL KNEE ARTHROPLASTY     Left  . TUBAL LIGATION      ROS:   As stated in the HPI and negative for all other systems.  PHYSICAL EXAM BP 140/65   Pulse 63   Ht 5\' 3"  (1.6 m)   Wt 170 lb (77.1 kg)   LMP 11/05/1992   BMI 30.11 kg/m  PHYSICAL EXAM GENERAL:  Well appearing NECK:  No jugular venous distention, waveform within normal limits, carotid upstroke brisk and symmetric, no bruits, no thyromegaly LUNGS:  Clear to auscultation bilaterally BACK:  No CVA tenderness CHEST:  Unremarkable HEART:  PMI not displaced or sustained,S1 and S2 within normal limits, no S3, no S4, no clicks, no rubs, apical holosystolic murmur without radiation. ABD:  Flat, positive bowel sounds normal in frequency in pitch, no bruits, no rebound, no guarding, no midline pulsatile mass, no hepatomegaly, no splenomegaly EXT:  2  plus pulses throughout, no edema, no cyanosis no clubbing   EKG:  Sinus rhythm, rate 63, premature atrial contractions and bigeminal pattern with occasional couplets. Left axis deviation, left anterior fascicular block, poor anterior R wave progression.  07/08/2016   ASSESSMENT AND PLAN  Atrial flutter -  The patient has had multiple atrial ectopy.  She's not had any problems with this. She's been intolerant of anticoagulation in the past and didn't want to start for this and she's not had documented flutter or fibrillation recently. Therefore, she'll not be on anticoagulation.  MITRAL REGURGITATION -  This is moderate and she has no symptoms. I will plan to follow this next year with an echo ordered  before I see her in November.  HYPERTENSION -  The blood pressure is at target. No change in medications is indicated. We will continue with therapeutic lifestyle changes (TLC).

## 2016-07-08 ENCOUNTER — Encounter: Payer: Self-pay | Admitting: Cardiology

## 2016-07-08 ENCOUNTER — Ambulatory Visit (INDEPENDENT_AMBULATORY_CARE_PROVIDER_SITE_OTHER): Payer: Medicare Other | Admitting: Cardiology

## 2016-07-08 VITALS — BP 140/65 | HR 63 | Ht 63.0 in | Wt 170.0 lb

## 2016-07-08 DIAGNOSIS — I080 Rheumatic disorders of both mitral and aortic valves: Secondary | ICD-10-CM

## 2016-07-08 DIAGNOSIS — I483 Typical atrial flutter: Secondary | ICD-10-CM | POA: Diagnosis not present

## 2016-07-08 NOTE — Patient Instructions (Signed)
Medication Instructions:  Continue current medications  Labwork: None Ordered  Testing/Procedures: Your physician has requested that you have an echocardiogram in 1 Year. Echocardiography is a painless test that uses sound waves to create images of your heart. It provides your doctor with information about the size and shape of your heart and how well your heart's chambers and valves are working. This procedure takes approximately one hour. There are no restrictions for this procedure.   Follow-Up: Your physician wants you to follow-up in: 1 Year. You will receive a reminder letter in the mail two months in advance. If you don't receive a letter, please call our office to schedule the follow-up appointment.   Any Other Special Instructions Will Be Listed Below (If Applicable).     If you need a refill on your cardiac medications before your next appointment, please call your pharmacy.

## 2016-07-10 ENCOUNTER — Telehealth: Payer: Self-pay | Admitting: Nurse Practitioner

## 2016-07-10 NOTE — Telephone Encounter (Signed)
Denied.

## 2016-09-05 ENCOUNTER — Other Ambulatory Visit: Payer: Self-pay | Admitting: Nurse Practitioner

## 2016-09-05 DIAGNOSIS — I10 Essential (primary) hypertension: Secondary | ICD-10-CM

## 2016-09-09 ENCOUNTER — Ambulatory Visit (INDEPENDENT_AMBULATORY_CARE_PROVIDER_SITE_OTHER): Payer: Medicare Other | Admitting: Nurse Practitioner

## 2016-09-09 ENCOUNTER — Encounter: Payer: Self-pay | Admitting: Nurse Practitioner

## 2016-09-09 VITALS — BP 124/48 | HR 53 | Temp 97.9°F | Ht 63.0 in | Wt 169.0 lb

## 2016-09-09 DIAGNOSIS — Z6830 Body mass index (BMI) 30.0-30.9, adult: Secondary | ICD-10-CM | POA: Diagnosis not present

## 2016-09-09 DIAGNOSIS — R1013 Epigastric pain: Secondary | ICD-10-CM | POA: Diagnosis not present

## 2016-09-09 DIAGNOSIS — I10 Essential (primary) hypertension: Secondary | ICD-10-CM | POA: Diagnosis not present

## 2016-09-09 DIAGNOSIS — I483 Typical atrial flutter: Secondary | ICD-10-CM

## 2016-09-09 DIAGNOSIS — E785 Hyperlipidemia, unspecified: Secondary | ICD-10-CM | POA: Diagnosis not present

## 2016-09-09 DIAGNOSIS — M810 Age-related osteoporosis without current pathological fracture: Secondary | ICD-10-CM | POA: Diagnosis not present

## 2016-09-09 MED ORDER — ATORVASTATIN CALCIUM 40 MG PO TABS: 40.0000 mg | ORAL_TABLET | Freq: Every day | ORAL | 5 refills | Status: DC

## 2016-09-09 NOTE — Progress Notes (Signed)
Subjective:    Patient ID: Brooke Chavez, female    DOB: 1941-07-31, 76 y.o.   MRN: [id number]   Patient here today for follow up of chronic medical problems. There have been no changes since last visit. No complaints today.  Outpatient Encounter Prescriptions as of 02/14/2016  Medication Sig  . acetaminophen (TYLENOL) 325 MG tablet Take 325 mg by mouth every 6 (six) hours as needed. For pain     . amLODipine (NORVASC) 10 MG tablet TAKE 1 TABLET (10 MG TOTAL) BY MOUTH DAILY.  Marland Kitchen atorvastatin (LIPITOR) 40 MG tablet Take 1 tablet (40 mg total) by mouth daily.  Marland Kitchen spironolactone (ALDACTONE) 25 MG tablet Take 1 tablet (25 mg total) by mouth daily.  . valsartan-hydrochlorothiazide (DIOVAN-HCT) 80-12.5 MG tablet Take 1 tablet by mouth daily.         Hypertension  This is a chronic problem. The current episode started more than 1 year ago. The problem has been waxing and waning since onset. The problem is controlled. Pertinent negatives include no anxiety, blurred vision, chest pain, palpitations, peripheral edema or shortness of breath. There are no associated agents to hypertension. Risk factors for coronary artery disease include dyslipidemia and post-menopausal state. Past treatments include diuretics, calcium channel blockers and angiotensin blockers. The current treatment provides moderate improvement. There are no compliance problems.  There is no history of sleep apnea or a thyroid problem.  Hyperlipidemia  This is a chronic problem. The current episode started more than 1 year ago. The problem is controlled. Recent lipid tests were reviewed and are normal. She has no history of diabetes or obesity. Factors aggravating her hyperlipidemia include fatty foods. Pertinent negatives include no chest pain or shortness of breath. Current antihyperlipidemic treatment includes statins. The current treatment provides moderate improvement of lipids. Risk factors for coronary artery disease include  post-menopausal, hypertension and dyslipidemia.  Osteoporosis Pt stopped taking her fosamax about 6 months ago. States she had a hard time remembering to take the medication. Atrial flutter/mitral valve regurgitation Pt states she see Dr. Casimer Lanius about a year ago and received a good report. States she is due to see him soon.  dyspepsia currently not on any rx medicine- just uses TUMS when occurs.    Review of Systems  Constitutional: Negative.   HENT: Negative.   Eyes: Negative for blurred vision.  Respiratory: Negative.  Negative for shortness of breath.   Cardiovascular: Negative.  Negative for chest pain and palpitations.  Gastrointestinal: Negative.   Genitourinary: Negative.   Musculoskeletal: Negative.   Neurological: Negative.   All other systems reviewed and are negative.      Objective:   Physical Exam  Constitutional: She is oriented to person, place, and time. She appears well-developed and well-nourished.  HENT:  Nose: Nose normal.  Mouth/Throat: Oropharynx is clear and moist.  Eyes: EOM are normal.  Neck: Trachea normal, normal range of motion and full passive range of motion without pain. Neck supple. No JVD present. Carotid bruit is not present. No thyromegaly present.  Cardiovascular: Normal rate, normal heart sounds and intact distal pulses.  Exam reveals no gallop and no friction rub.   No murmur heard. Irregular heart beat  Pulmonary/Chest: Effort normal and breath sounds normal.  Abdominal: Soft. Bowel sounds are normal. She exhibits no distension and no mass. There is no tenderness.  Musculoskeletal: Normal range of motion.  Lymphadenopathy:    She has no cervical adenopathy.  Neurological: She is alert and oriented to person, place, and  time. She has normal reflexes.  Skin: Skin is warm and dry.  Psychiatric: She has a normal mood and affect. Her behavior is normal. Judgment and thought content normal.   LMP 11/05/1992   BP (!) 124/48   Pulse (!)  53   Temp 97.9 F (36.6 C) (Oral)   Ht 5\' 3"  (1.6 m)   Wt 169 lb (76.7 kg)   LMP 11/05/1992   BMI 29.94 kg/m       Assessment & Plan:  1. Typical atrial flutter ([facility]) Avoid caffeine  2. Essential hypertension Low sodium diet  3. Age-related osteoporosis without current pathological fracture Weight earing exericses  4. BMI 30.0-30.9,adult Discussed diet and exercise for person with BMI >25 Will recheck weight in 3-6 months  5. Dyspepsia Avoid spicy foods Do not eat 2 hours prior to bedtime  6. Hyperlipidemia with target LDL less than 100 Low fat diet - atorvastatin (LIPITOR) 40 MG tablet; Take 1 tablet (40 mg total) by mouth daily.  Dispense: 30 tablet; Refill: 5    Labs pending Health maintenance reviewed Diet and exercise encouraged Continue all meds Follow up  In 6 months   [place], FNP

## 2016-09-09 NOTE — Addendum Note (Signed)
Addended by: Rolena Infante on: 09/09/2016 02:43 PM   Modules accepted: Orders

## 2016-09-09 NOTE — Patient Instructions (Signed)
Bone Health Introduction Bones protect organs, store calcium, and anchor muscles. Good health habits, such as eating nutritious foods and exercising regularly, are important for maintaining healthy bones. They can also help to prevent a condition that causes bones to lose density and become weak and brittle (osteoporosis). Why is bone mass important? Bone mass refers to the amount of bone tissue that you have. The higher your bone mass, the stronger your bones. An important step toward having healthy bones throughout life is to have strong and dense bones during childhood. A young adult who has a high bone mass is more likely to have a high bone mass later in life. Bone mass at its greatest it is called peak bone mass. A large decline in bone mass occurs in older adults. In women, it occurs about the time of menopause. During this time, it is important to practice good health habits, because if more bone is lost than what is replaced, the bones will become less healthy and more likely to break (fracture). If you find that you have a low bone mass, you may be able to prevent osteoporosis or further bone loss by changing your diet and lifestyle. How can I find out if my bone mass is low? Bone mass can be measured with an X-ray test that is called a bone mineral density (BMD) test. This test is recommended for all women who are age 60 or older. It may also be recommended for men who are age 381 or older, or for people who are more likely to develop osteoporosis due to:  Having bones that break easily.  Having a long-term disease that weakens bones, such as kidney disease or rheumatoid arthritis.  Having menopause earlier than normal.  Taking medicine that weakens bones, such as steroids, thyroid hormones, or hormone treatment for breast cancer or prostate cancer.  Smoking.  Drinking three or more alcoholic drinks each day. What are the nutritional recommendations for healthy bones? To have healthy  bones, you need to get enough of the right minerals and vitamins. Most nutrition experts recommend getting these nutrients from the foods that you eat. Nutritional recommendations vary from person to person. Ask your health care provider what is healthy for you. Here are some general guidelines. Calcium Recommendations  Calcium is the most important (essential) mineral for bone health. Most people can get enough calcium from their diet, but supplements may be recommended for people who are at risk for osteoporosis. Good sources of calcium include:  Dairy products, such as low-fat or nonfat milk, cheese, and yogurt.  Dark green leafy vegetables, such as bok choy and broccoli.  Calcium-fortified foods, such as orange juice, cereal, bread, soy beverages, and tofu products.  Nuts, such as almonds. Follow these recommended amounts for daily calcium intake:  Children, age 38?3: 700 mg.  Children, age 91?8: 1,000 mg.  Children, age 86?13: 1,300 mg.  Teens, age 91?18: 1,300 mg.  Adults, age 69?50: 1,000 mg.  Adults, age 92?70:  Men: 1,000 mg.  Women: 1,200 mg.  Adults, age 91 or older: 1,200 mg.  Pregnant and breastfeeding females:  Teens: 1,300 mg.  Adults: 1,000 mg. Vitamin D Recommendations  Vitamin D is the most essential vitamin for bone health. It helps the body to absorb calcium. Sunlight stimulates the skin to make vitamin D, so be sure to get enough sunlight. If you live in a cold climate or you do not get outside often, your health care provider may recommend that you take vitamin  D supplements. Good sources of vitamin D in your diet include:  Egg yolks.  Saltwater fish.  Milk and cereal fortified with vitamin D. Follow these recommended amounts for daily vitamin D intake:  Children and teens, age 35?18: 72 international units.  Adults, age 50 or younger: 400-800 international units.  Adults, age 37 or older: 800-1,000 international units. Other Nutrients  Other  nutrients for bone health include:  Phosphorus. This mineral is found in meat, poultry, dairy foods, nuts, and legumes. The recommended daily intake for adult men and adult women is 700 mg.  Magnesium. This mineral is found in seeds, nuts, dark green vegetables, and legumes. The recommended daily intake for adult men is 400?420 mg. For adult women, it is 310?320 mg.  Vitamin K. This vitamin is found in green leafy vegetables. The recommended daily intake is 120 mg for adult men and 90 mg for adult women. What type of physical activity is best for building and maintaining healthy bones? Weight-bearing and strength-building activities are important for building and maintaining peak bone mass. Weight-bearing activities cause muscles and bones to work against gravity. Strength-building activities increases muscle strength that supports bones. Weight-bearing and muscle-building activities include:  Walking and hiking.  Jogging and running.  Dancing.  Gym exercises.  Lifting weights.  Tennis and racquetball.  Climbing stairs.  Aerobics. Adults should get at least 30 minutes of moderate physical activity on most days. Children should get at least 60 minutes of moderate physical activity on most days. Ask your health care provide what type of exercise is best for you. Where can I find more information? For more information, check out the following websites:  [place]: YardHomes.se  [employer] of [employer]: http://www.niams.AnonymousEar.fr.asp This information is not intended to replace advice given to you by your health care provider. Make sure you discuss any questions you have with your health care provider. Document Released: 10/18/2003 Document Revised: 02/15/2016 Document Reviewed: 08/02/2014  2017 [employer]vier

## 2016-09-10 LAB — CMP14+EGFR
ALT: 14 IU/L (ref 0–32)
AST: 23 IU/L (ref 0–40)
Albumin/Globulin Ratio: 1.5 (ref 1.2–2.2)
Albumin: 4.3 g/dL (ref 3.5–4.8)
Alkaline Phosphatase: 71 IU/L (ref 39–117)
BUN/Creatinine Ratio: 14 (ref 12–28)
BUN: 21 mg/dL (ref 8–27)
Bilirubin Total: 0.5 mg/dL (ref 0.0–1.2)
CO2: 25 mmol/L (ref 18–29)
Calcium: 9.3 mg/dL (ref 8.7–10.3)
Chloride: 104 mmol/L (ref 96–106)
Creatinine, Ser: 1.51 mg/dL — ABNORMAL HIGH (ref 0.57–1.00)
GFR calc Af Amer: 38 mL/min/{1.73_m2} — ABNORMAL LOW (ref >59)
GFR calc non Af Amer: 33 mL/min/{1.73_m2} — ABNORMAL LOW (ref >59)
Globulin, Total: 2.8 g/dL (ref 1.5–4.5)
Glucose: 73 mg/dL (ref 65–99)
Potassium: 4.6 mmol/L (ref 3.5–5.2)
Sodium: 144 mmol/L (ref 134–144)
Total Protein: 7.1 g/dL (ref 6.0–8.5)

## 2016-09-10 LAB — LIPID PANEL
Chol/HDL Ratio: 2.8 ratio units (ref 0.0–4.4)
Cholesterol, Total: 184 mg/dL (ref 100–199)
HDL: 66 mg/dL (ref >39)
LDL Calculated: 100 mg/dL — ABNORMAL HIGH (ref 0–99)
Triglycerides: 89 mg/dL (ref 0–149)
VLDL Cholesterol Cal: 18 mg/dL (ref 5–40)

## 2016-11-12 DIAGNOSIS — Z0289 Encounter for other administrative examinations: Secondary | ICD-10-CM

## 2017-01-06 ENCOUNTER — Ambulatory Visit: Payer: Medicare Other

## 2017-01-07 ENCOUNTER — Encounter: Payer: Self-pay | Admitting: Nurse Practitioner

## 2017-03-10 DIAGNOSIS — H2513 Age-related nuclear cataract, bilateral: Secondary | ICD-10-CM | POA: Diagnosis not present

## 2017-03-12 ENCOUNTER — Telehealth: Payer: Self-pay | Admitting: Nurse Practitioner

## 2017-03-12 MED ORDER — LOSARTAN POTASSIUM-HCTZ 50-12.5 MG PO TABS
1.0000 | ORAL_TABLET | Freq: Every day | ORAL | 3 refills | Status: DC
Start: 2017-03-12 — End: 2017-04-28

## 2017-03-12 NOTE — Telephone Encounter (Signed)
Please advise 

## 2017-03-12 NOTE — Telephone Encounter (Signed)
Valsartan changed to losartan

## 2017-03-17 ENCOUNTER — Other Ambulatory Visit: Payer: Self-pay | Admitting: Nurse Practitioner

## 2017-03-17 DIAGNOSIS — I10 Essential (primary) hypertension: Secondary | ICD-10-CM

## 2017-04-20 ENCOUNTER — Other Ambulatory Visit: Payer: Self-pay | Admitting: Nurse Practitioner

## 2017-04-20 DIAGNOSIS — I10 Essential (primary) hypertension: Secondary | ICD-10-CM

## 2017-04-21 ENCOUNTER — Ambulatory Visit: Payer: Medicare Other | Admitting: Nurse Practitioner

## 2017-04-28 ENCOUNTER — Encounter: Payer: Self-pay | Admitting: Nurse Practitioner

## 2017-04-28 ENCOUNTER — Ambulatory Visit (INDEPENDENT_AMBULATORY_CARE_PROVIDER_SITE_OTHER): Payer: Medicare Other | Admitting: Nurse Practitioner

## 2017-04-28 VITALS — BP 148/84 | HR 75 | Temp 98.4°F | Ht 63.0 in | Wt 166.6 lb

## 2017-04-28 DIAGNOSIS — Z6830 Body mass index (BMI) 30.0-30.9, adult: Secondary | ICD-10-CM | POA: Diagnosis not present

## 2017-04-28 DIAGNOSIS — I483 Typical atrial flutter: Secondary | ICD-10-CM

## 2017-04-28 DIAGNOSIS — M810 Age-related osteoporosis without current pathological fracture: Secondary | ICD-10-CM | POA: Diagnosis not present

## 2017-04-28 DIAGNOSIS — I10 Essential (primary) hypertension: Secondary | ICD-10-CM

## 2017-04-28 DIAGNOSIS — E785 Hyperlipidemia, unspecified: Secondary | ICD-10-CM

## 2017-04-28 DIAGNOSIS — R1013 Epigastric pain: Secondary | ICD-10-CM

## 2017-04-28 MED ORDER — AMLODIPINE BESYLATE 10 MG PO TABS: ORAL_TABLET | ORAL | 1 refills | Status: DC

## 2017-04-28 MED ORDER — SPIRONOLACTONE 25 MG PO TABS: ORAL_TABLET | ORAL | 1 refills | Status: DC

## 2017-04-28 MED ORDER — ATORVASTATIN CALCIUM 40 MG PO TABS: 40.0000 mg | ORAL_TABLET | Freq: Every day | ORAL | 5 refills | Status: DC

## 2017-04-28 NOTE — Patient Instructions (Signed)
DASH Eating Plan DASH stands for "Dietary Approaches to Stop Hypertension." The DASH eating plan is a healthy eating plan that has been shown to reduce high blood pressure (hypertension). It may also reduce your risk for type 2 diabetes, heart disease, and stroke. The DASH eating plan may also help with weight loss. What are tips for following this plan? General guidelines  Avoid eating more than 2,300 mg (milligrams) of salt (sodium) a day. If you have hypertension, you may need to reduce your sodium intake to 1,500 mg a day.  Limit alcohol intake to no more than 1 drink a day for nonpregnant women and 2 drinks a day for men. One drink equals 12 oz of beer, 5 oz of wine, or 1 oz of hard liquor.  Work with your health care provider to maintain a healthy body weight or to lose weight. Ask what an ideal weight is for you.  Get at least 30 minutes of exercise that causes your heart to beat faster (aerobic exercise) most days of the week. Activities may include walking, swimming, or biking.  Work with your health care provider or diet and nutrition specialist (dietitian) to adjust your eating plan to your individual calorie needs. Reading food labels  Check food labels for the amount of sodium per serving. Choose foods with less than 5 percent of the Daily Value of sodium. Generally, foods with less than 300 mg of sodium per serving fit into this eating plan.  To find whole grains, look for the word "whole" as the first word in the ingredient list. Shopping  Buy products labeled as "low-sodium" or "no salt added."  Buy fresh foods. Avoid canned foods and premade or frozen meals. Cooking  Avoid adding salt when cooking. Use salt-free seasonings or herbs instead of table salt or sea salt. Check with your health care provider or pharmacist before using salt substitutes.  Do not fry foods. Cook foods using healthy methods such as baking, boiling, grilling, and broiling instead.  Cook with  heart-healthy oils, such as olive, canola, soybean, or sunflower oil. Meal planning   Eat a balanced diet that includes: ? 5 or more servings of fruits and vegetables each day. At each meal, try to fill half of your plate with fruits and vegetables. ? Up to 6-8 servings of whole grains each day. ? Less than 6 oz of lean meat, poultry, or fish each day. A 3-oz serving of meat is about the same size as a deck of cards. One egg equals 1 oz. ? 2 servings of low-fat dairy each day. ? A serving of nuts, seeds, or beans 5 times each week. ? Heart-healthy fats. Healthy fats called Omega-3 fatty acids are found in foods such as flaxseeds and coldwater fish, like sardines, salmon, and mackerel.  Limit how much you eat of the following: ? Canned or prepackaged foods. ? Food that is high in trans fat, such as fried foods. ? Food that is high in saturated fat, such as fatty meat. ? Sweets, desserts, sugary drinks, and other foods with added sugar. ? Full-fat dairy products.  Do not salt foods before eating.  Try to eat at least 2 vegetarian meals each week.  Eat more home-cooked food and less restaurant, buffet, and fast food.  When eating at a restaurant, ask that your food be prepared with less salt or no salt, if possible. What foods are recommended? The items listed may not be a complete list. Talk with your dietitian about what   dietary choices are best for you. Grains Whole-grain or whole-wheat bread. Whole-grain or whole-wheat pasta. Brown rice. Oatmeal. Quinoa. Bulgur. Whole-grain and low-sodium cereals. Pita bread. Low-fat, low-sodium crackers. Whole-wheat flour tortillas. Vegetables Fresh or frozen vegetables (raw, steamed, roasted, or grilled). Low-sodium or reduced-sodium tomato and vegetable juice. Low-sodium or reduced-sodium tomato sauce and tomato paste. Low-sodium or reduced-sodium canned vegetables. Fruits All fresh, dried, or frozen fruit. Canned fruit in natural juice (without  added sugar). Meat and other protein foods Skinless chicken or turkey. Ground chicken or turkey. Pork with fat trimmed off. Fish and seafood. Egg whites. Dried beans, peas, or lentils. Unsalted nuts, nut butters, and seeds. Unsalted canned beans. Lean cuts of beef with fat trimmed off. Low-sodium, lean deli meat. Dairy Low-fat (1%) or fat-free (skim) milk. Fat-free, low-fat, or reduced-fat cheeses. Nonfat, low-sodium ricotta or cottage cheese. Low-fat or nonfat yogurt. Low-fat, low-sodium cheese. Fats and oils Soft margarine without trans fats. Vegetable oil. Low-fat, reduced-fat, or light mayonnaise and salad dressings (reduced-sodium). Canola, safflower, olive, soybean, and sunflower oils. Avocado. Seasoning and other foods Herbs. Spices. Seasoning mixes without salt. Unsalted popcorn and pretzels. Fat-free sweets. What foods are not recommended? The items listed may not be a complete list. Talk with your dietitian about what dietary choices are best for you. Grains Baked goods made with fat, such as croissants, muffins, or some breads. Dry pasta or rice meal packs. Vegetables Creamed or fried vegetables. Vegetables in a cheese sauce. Regular canned vegetables (not low-sodium or reduced-sodium). Regular canned tomato sauce and paste (not low-sodium or reduced-sodium). Regular tomato and vegetable juice (not low-sodium or reduced-sodium). Pickles. Olives. Fruits Canned fruit in a light or heavy syrup. Fried fruit. Fruit in cream or butter sauce. Meat and other protein foods Fatty cuts of meat. Ribs. Fried meat. Bacon. Sausage. Bologna and other processed lunch meats. Salami. Fatback. Hotdogs. Bratwurst. Salted nuts and seeds. Canned beans with added salt. Canned or smoked fish. Whole eggs or egg yolks. Chicken or turkey with skin. Dairy Whole or 2% milk, cream, and half-and-half. Whole or full-fat cream cheese. Whole-fat or sweetened yogurt. Full-fat cheese. Nondairy creamers. Whipped toppings.  Processed cheese and cheese spreads. Fats and oils Butter. Stick margarine. Lard. Shortening. Ghee. Bacon fat. Tropical oils, such as coconut, palm kernel, or palm oil. Seasoning and other foods Salted popcorn and pretzels. Onion salt, garlic salt, seasoned salt, table salt, and sea salt. Worcestershire sauce. Tartar sauce. Barbecue sauce. Teriyaki sauce. Soy sauce, including reduced-sodium. Steak sauce. Canned and packaged gravies. Fish sauce. Oyster sauce. Cocktail sauce. Horseradish that you find on the shelf. Ketchup. Mustard. Meat flavorings and tenderizers. Bouillon cubes. Hot sauce and Tabasco sauce. Premade or packaged marinades. Premade or packaged taco seasonings. Relishes. Regular salad dressings. Where to find more information:  National Heart, Lung, and Blood Institute: www.nhlbi.nih.gov  American Heart Association: www.heart.org Summary  The DASH eating plan is a healthy eating plan that has been shown to reduce high blood pressure (hypertension). It may also reduce your risk for type 2 diabetes, heart disease, and stroke.  With the DASH eating plan, you should limit salt (sodium) intake to 2,300 mg a day. If you have hypertension, you may need to reduce your sodium intake to 1,500 mg a day.  When on the DASH eating plan, aim to eat more fresh fruits and vegetables, whole grains, lean proteins, low-fat dairy, and heart-healthy fats.  Work with your health care provider or diet and nutrition specialist (dietitian) to adjust your eating plan to your individual   calorie needs. This information is not intended to replace advice given to you by your health care provider. Make sure you discuss any questions you have with your health care provider. Document Released: 07/17/2011 Document Revised: 07/21/2016 Document Reviewed: 07/21/2016 Elsevier Interactive Patient Education  2017 Elsevier Inc.  

## 2017-04-28 NOTE — Progress Notes (Signed)
Subjective:    Patient ID: Brooke Chavez, female    DOB: Dec 24, 1940, 76 y.o.   MRN: [id number]  HPI Brooke Chavez is here today for follow up of chronic medical problem.  Outpatient Encounter Prescriptions as of 04/28/2017  Medication Sig  . acetaminophen (TYLENOL) 325 MG tablet Take 325 mg by mouth every 6 (six) hours as needed. For pain     . amLODipine (NORVASC) 10 MG tablet TAKE 1 TABLET BY MOUTH ONCE DAILY (NEEDS TO BE SEEN FOR REFILLS)  . atorvastatin (LIPITOR) 40 MG tablet Take 1 tablet (40 mg total) by mouth daily.  Marland Kitchen losartan-hydrochlorothiazide (HYZAAR) 50-12.5 MG tablet Take 1 tablet by mouth daily.  Marland Kitchen spironolactone (ALDACTONE) 25 MG tablet TAKE 1 TABLET BY MOUTH ONCE DAILY (NEEDS TO BE SEEN BEFORE NEXT REFILL)   No facility-administered encounter medications on file as of 04/28/2017.     1. Essential hypertension  Blood pressure controlled with amlodipine and Hyzaar.  Patient does not check BP at home.  2. Typical atrial flutter ([place])  Patient followed regularly by a cardiologist.  3. Age-related osteoporosis without current pathological fracture  Patient not managing with medication, but participating in regular weight-bearing exercise.  4. Hyperlipidemia with target LDL less than 100  Patient taking atorvastatin for management.  LFTs monitored regularly.  5. Dyspepsia  Not currently on medication.  Using TUMS when needed.  6. BMI 30.0-30.9,adult  No significant weight gain or loss.    New complaints: Patient states she received a letter in the mail regarding her valsartan and she wanted to be sure she was no longer prescribed this.  Patient assured that her medicine was changed to losartan and she voices understanding.  Social history: Lives alone   Review of Systems  Constitutional: Negative for activity change, appetite change and fatigue.  Respiratory: Negative for cough and shortness of breath.   Cardiovascular: Negative for chest pain and palpitations.    Musculoskeletal: Negative for arthralgias.  Neurological: Negative for dizziness, light-headedness and headaches.  All other systems reviewed and are negative.      Objective:   Physical Exam  Constitutional: She is oriented to person, place, and time. She appears well-developed and well-nourished. No distress.  HENT:  Head: Normocephalic.  Right Ear: External ear normal.  Left Ear: External ear normal.  Mouth/Throat: Oropharynx is clear and moist.  Eyes: Pupils are equal, round, and reactive to light.  Neck: Normal range of motion. Neck supple. No thyromegaly present.  Cardiovascular: Normal rate, normal heart sounds and intact distal pulses.   No murmur heard. Pulmonary/Chest: Effort normal and breath sounds normal. No respiratory distress. She has no wheezes.  Abdominal: Soft. Bowel sounds are normal. She exhibits no distension. There is no tenderness.  Musculoskeletal: Normal range of motion. She exhibits edema (nonpitting bilateral lower extremities).  Lymphadenopathy:    She has no cervical adenopathy.  Neurological: She is alert and oriented to person, place, and time.  Skin: Skin is warm and dry.  Psychiatric: She has a normal mood and affect. Her behavior is normal.   Pulse 75   Temp 98.4 F (36.9 C) (Oral)   Ht 5' 3" (1.6 m)   Wt 166 lb 9.6 oz (75.6 kg)   LMP 11/05/1992   SpO2 98%   BMI 29.51 kg/m     Assessment & Plan:  1. Essential hypertension Low sodium diet - spironolactone (ALDACTONE) 25 MG tablet; TAKE 1 TABLET BY MOUTH ONCE DAILY  Dispense: 90 tablet; Refill: 1 -  amLODipine (NORVASC) 10 MG tablet; TAKE 1 TABLET BY MOUTH ONCE DAILY (NEEDS TO BE SEEN FOR REFILLS)  Dispense: 90 tablet; Refill: 1 - CMP14+EGFR  2. Typical atrial flutter ([facility])   3. Age-related osteoporosis without current pathological fracture Weight bearing exercises encouraged  4. Hyperlipidemia with target LDL less than 100 Low fat diet - atorvastatin (LIPITOR) 40 MG tablet; Take 1  tablet (40 mg total) by mouth daily.  Dispense: 30 tablet; Refill: 5 - Lipid panel  5. Dyspepsia Avoid spicy foods Do not eat 2 hours prior to bedtime  6. BMI 30.0-30.9,adult Discussed diet and exercise for person with BMI >25 Will recheck weight in 3-6 months    Labs pending Health maintenance reviewed Diet and exercise encouraged Continue all meds Follow up  In 6 months   [place], FNP

## 2017-04-29 LAB — CMP14+EGFR
ALT: 15 IU/L (ref 0–32)
AST: 21 IU/L (ref 0–40)
Albumin/Globulin Ratio: 1.5 (ref 1.2–2.2)
Albumin: 4.2 g/dL (ref 3.5–4.8)
Alkaline Phosphatase: 68 IU/L (ref 39–117)
BUN/Creatinine Ratio: 16 (ref 12–28)
BUN: 23 mg/dL (ref 8–27)
Bilirubin Total: 0.5 mg/dL (ref 0.0–1.2)
CO2: 23 mmol/L (ref 20–29)
Calcium: 9.3 mg/dL (ref 8.7–10.3)
Chloride: 104 mmol/L (ref 96–106)
Creatinine, Ser: 1.42 mg/dL — ABNORMAL HIGH (ref 0.57–1.00)
GFR calc Af Amer: 41 mL/min/{1.73_m2} — ABNORMAL LOW (ref >59)
GFR calc non Af Amer: 36 mL/min/{1.73_m2} — ABNORMAL LOW (ref >59)
Globulin, Total: 2.8 g/dL (ref 1.5–4.5)
Glucose: 76 mg/dL (ref 65–99)
Potassium: 4.5 mmol/L (ref 3.5–5.2)
Sodium: 142 mmol/L (ref 134–144)
Total Protein: 7 g/dL (ref 6.0–8.5)

## 2017-04-29 LAB — LIPID PANEL
Chol/HDL Ratio: 2.9 ratio (ref 0.0–4.4)
Cholesterol, Total: 172 mg/dL (ref 100–199)
HDL: 60 mg/dL (ref >39)
LDL Calculated: 88 mg/dL (ref 0–99)
Triglycerides: 119 mg/dL (ref 0–149)
VLDL Cholesterol Cal: 24 mg/dL (ref 5–40)

## 2017-05-08 ENCOUNTER — Telehealth: Payer: Self-pay | Admitting: Nurse Practitioner

## 2017-05-08 ENCOUNTER — Other Ambulatory Visit: Payer: Self-pay | Admitting: *Deleted

## 2017-05-08 DIAGNOSIS — E785 Hyperlipidemia, unspecified: Secondary | ICD-10-CM

## 2017-05-08 DIAGNOSIS — I10 Essential (primary) hypertension: Secondary | ICD-10-CM

## 2017-05-08 MED ORDER — ATORVASTATIN CALCIUM 40 MG PO TABS: 40.0000 mg | ORAL_TABLET | Freq: Every day | ORAL | 5 refills | Status: DC

## 2017-05-08 MED ORDER — AMLODIPINE BESYLATE 10 MG PO TABS: ORAL_TABLET | ORAL | 1 refills | Status: DC

## 2017-05-08 MED ORDER — SPIRONOLACTONE 25 MG PO TABS: ORAL_TABLET | ORAL | 1 refills | Status: DC

## 2017-05-08 NOTE — Telephone Encounter (Signed)
Patient aware that Rx has been sent to [facility]

## 2017-05-19 ENCOUNTER — Ambulatory Visit (INDEPENDENT_AMBULATORY_CARE_PROVIDER_SITE_OTHER): Payer: Medicare Other | Admitting: *Deleted

## 2017-05-19 ENCOUNTER — Encounter: Payer: Self-pay | Admitting: *Deleted

## 2017-05-19 VITALS — BP 120/57 | HR 67 | Ht 62.0 in | Wt 163.0 lb

## 2017-05-19 DIAGNOSIS — Z0000 Encounter for general adult medical examination without abnormal findings: Secondary | ICD-10-CM

## 2017-05-19 NOTE — Patient Instructions (Addendum)
  Ms. Shinault , Thank you for taking time to come for your Medicare Wellness Visit. I appreciate your ongoing commitment to your health goals. Please review the following plan we discussed and let me know if I can assist you in the future.   These are the goals we discussed: Goals    . Exercise 150 minutes per week (moderate activity)          Chair exercises daily        This is a list of the screening recommended for you and due dates:  Health Maintenance  Topic Date Due  . Flu Shot  03/11/2017  . Pap Smear  04/28/2018*  . Pneumonia vaccines (1 of 2 - PCV13) 04/28/2018*  . Mammogram  06/03/2017  . DEXA scan (bone density measurement)  07/12/2017  . Tetanus Vaccine  04/11/2021  *Topic was postponed. The date shown is not the original due date.   Try the strengthening exercises that we discussed to help with bowel control and if you don't see any improvement or if you would like a gastroenterology referral please let us know.   Check your blood pressure daily and keep a log for a week or two. I would like to see if your bottom number is staying low.

## 2017-05-22 NOTE — Progress Notes (Signed)
Subjective:   Brooke Chavez is a 76 y.o. female who presents for an Initial Medicare Annual Wellness Visit. Brooke Chavez lives at home. Her daughter and grandson live with her. Her husband passed years ago. She enjoys computer games like freecell and stays active with house and yardwork.   Review of Systems    Reports that health is the same as last year.   Cardiac Risk Factors include: advanced age (>58men, >38 women);dyslipidemia;family history of premature cardiovascular disease;obesity (BMI >30kg/m2);hypertension  Other systems negative.     Objective:    Today's Vitals   05/19/17 1207  BP: (!) 120/57  Pulse: 67  Weight: 163 lb (73.9 kg)  Height: 5\' 2"  (1.575 m)   Body mass index is 29.81 kg/m.   Current Medications (verified) Outpatient Encounter Prescriptions as of 05/19/2017  Medication Sig  . acetaminophen (TYLENOL) 325 MG tablet Take 325 mg by mouth every 6 (six) hours as needed. For pain     . amLODipine (NORVASC) 10 MG tablet TAKE 1 TABLET BY MOUTH ONCE DAILY (NEEDS TO BE SEEN FOR REFILLS)  . atorvastatin (LIPITOR) 40 MG tablet Take 1 tablet (40 mg total) by mouth daily.  Marland Kitchen losartan-hydrochlorothiazide (HYZAAR) 50-12.5 MG tablet Take 1 tablet by mouth daily.  Marland Kitchen spironolactone (ALDACTONE) 25 MG tablet TAKE 1 TABLET BY MOUTH ONCE DAILY   No facility-administered encounter medications on file as of 05/19/2017.     Allergies (verified) Aspirin and Oxycodone-acetaminophen   History: Past Medical History:  Diagnosis Date  . Atrial flutter ([place])    Typical by EKG diagnosis 9/11 s/p CIT ablation 11/11  . Bradycardia   . DJD (degenerative joint disease)   . Femur fracture, left ([place]) 2013  . Hyperlipidemia    x5 years  . Hypertension    Since 1997  . Mitral regurgitation    Mild to moderate  . TR (tricuspid regurgitation)    Mild with RA enlargment   Past Surgical History:  Procedure Laterality Date  . A FLUTTER ABLATION N/A 06/27/2011   Procedure: ABLATION A  FLUTTER;  Surgeon: Thompson Grayer, MD;  Location[facility] CATH LAB;  Service: Cardiovascular;  Laterality: N/A;  . ATRIAL ABLATION SURGERY  06/2010  . HIP SURGERY     Left (fracture) 3/13  . KNEE ARTHROSCOPY     Right  . LUMBAR SPINE SURGERY    . TOTAL KNEE ARTHROPLASTY     Left  . TUBAL LIGATION     Family History  Problem Relation Age of Onset  . Hypertension Mother   . Cancer Sister        leukemia  . Diabetes Brother   . Stroke Sister   . Diabetes Brother   . Heart attack Brother   . Healthy Daughter   . Healthy Son   . Healthy Daughter    Social History   Occupational History  . Not on file.   Social History Main Topics  . Smoking status: Never Smoker  . Smokeless tobacco: Never Used  . Alcohol use No  . Drug use: No  . Sexual activity: No    Tobacco Counseling No tobacco use  Activities of Daily Living In your present state of health, do you have any difficulty performing the following activities: 05/19/2017 05/19/2017  Hearing? - Y  Comment - Has hearing aids that someone gave her but needs to have them fitted to her ears. She waiting until she has the funds.   Vision? - N  Difficulty concentrating or  making decisions? - Y  Comment - Has some problems with remembering names  Walking or climbing stairs? - N  Dressing or bathing? - N  Doing errands, shopping? - N  Conservation officer, nature and eating ? - N  Using the Toilet? - N  In the past six months, have you accidently leaked urine? - N  Do you have problems with loss of bowel control? Y N  Comment Has some episodes of fecal incontinence s/p colonoscopy years ago -  Managing your Medications? - N  Managing your Finances? - N  Housekeeping or managing your Housekeeping? - N  Some recent data might be hidden    Immunizations and Health Maintenance  There is no immunization history on file for this patient. There are no preventive care reminders to display for this patient.  Patient Care Team: [name], FNP as PCP - General (Nurse Practitioner)  No hospitalizations, ER visits, or surgeries this past year.      Assessment:   This is a routine wellness examination for Brooke Chavez.  Hearing/Vision screen No deficits noted. Eye exam up to date  Dietary issues and exercise activities discussed: Current Exercise Habits: The patient does not participate in regular exercise at present, Exercise limited by: None identified  Goals    . Exercise 150 minutes per week (moderate activity)          Chair exercises daily       Depression Screen PHQ 2/9 Scores 05/19/2017 04/28/2017 09/09/2016 02/14/2016 07/25/2015 07/24/2015 04/10/2015  PHQ - 2 Score 0 0 0 0 0 0 0    Fall Risk Fall Risk  05/19/2017 04/28/2017 09/09/2016 02/14/2016 07/25/2015  Falls in the past year? No No No No No    Cognitive Function: MMSE - Mini Mental State Exam 05/19/2017 07/25/2015  Orientation to time 4 5  Orientation to Place 5 5  Registration 3 3  Attention/ Calculation 5 5  Recall 3 3  Language- name 2 objects 2 2  Language- repeat 1 1  Language- follow 3 step command 3 3  Language- read & follow direction 1 1  Write a sentence 1 1  Copy design 1 1  Total score 29 30        Screening Tests Health Maintenance  Topic Date Due  . INFLUENZA VACCINE  11/17/2017 (Originally 03/11/2017)  . PAP SMEAR  04/28/2018 (Originally 09/19/2016)  . PNA vac Low Risk Adult (1 of 2 - PCV13) 04/28/2018 (Originally 08/29/2005)  . MAMMOGRAM  06/03/2017  . DEXA SCAN  07/12/2017  . TETANUS/TDAP  04/11/2021      Plan:  Mammogram scheduled. 150 min of moderate activity recommended weekly Keep f/u appt with PCP  I have personally reviewed and noted the following in the patient's chart:   . Medical and social history . Use of alcohol, tobacco or illicit drugs  . Current medications and supplements . Functional ability and status . Nutritional status . Physical activity . Advanced directives . List of other  physicians . Hospitalizations, surgeries, and ER visits in previous 12 months . Vitals . Screenings to include cognitive, depression, and falls . Referrals and appointments  In addition, I have reviewed and discussed with patient certain preventive protocols, quality metrics, and best practice recommendations. A written personalized care plan for preventive services as well as general preventive health recommendations were provided to patient.     Chong Sicilian, RN 05/22/2017    I have reviewed and agree with the above AWV documentation.   Mary-Margaret  Hassell Done, [place]

## 2017-05-28 ENCOUNTER — Telehealth: Payer: Self-pay | Admitting: Nurse Practitioner

## 2017-06-12 ENCOUNTER — Ambulatory Visit (HOSPITAL_COMMUNITY): Payer: Medicare Other | Attending: Cardiology

## 2017-06-12 ENCOUNTER — Other Ambulatory Visit: Payer: Self-pay

## 2017-06-12 DIAGNOSIS — I081 Rheumatic disorders of both mitral and tricuspid valves: Secondary | ICD-10-CM | POA: Insufficient documentation

## 2017-06-12 DIAGNOSIS — I080 Rheumatic disorders of both mitral and aortic valves: Secondary | ICD-10-CM | POA: Diagnosis not present

## 2017-06-12 DIAGNOSIS — R001 Bradycardia, unspecified: Secondary | ICD-10-CM | POA: Diagnosis not present

## 2017-06-12 DIAGNOSIS — E785 Hyperlipidemia, unspecified: Secondary | ICD-10-CM | POA: Diagnosis not present

## 2017-06-12 DIAGNOSIS — I119 Hypertensive heart disease without heart failure: Secondary | ICD-10-CM | POA: Insufficient documentation

## 2017-06-12 DIAGNOSIS — I272 Pulmonary hypertension, unspecified: Secondary | ICD-10-CM | POA: Insufficient documentation

## 2017-06-12 DIAGNOSIS — I4892 Unspecified atrial flutter: Secondary | ICD-10-CM | POA: Insufficient documentation

## 2017-06-23 ENCOUNTER — Telehealth: Payer: Self-pay | Admitting: Nurse Practitioner

## 2017-06-23 MED ORDER — HYDROCHLOROTHIAZIDE 25 MG PO TABS: 25.0000 mg | ORAL_TABLET | Freq: Every day | ORAL | 1 refills | Status: DC

## 2017-06-23 NOTE — Telephone Encounter (Signed)
Stopped hyzaar and changed to hctz 25mg  daily. patiett understands changes.

## 2017-06-23 NOTE — Telephone Encounter (Signed)
Do you want to change? Please advise

## 2017-06-30 ENCOUNTER — Telehealth: Payer: Self-pay | Admitting: Nurse Practitioner

## 2017-06-30 DIAGNOSIS — Z1231 Encounter for screening mammogram for malignant neoplasm of breast: Secondary | ICD-10-CM | POA: Diagnosis not present

## 2017-06-30 NOTE — Telephone Encounter (Signed)
error 

## 2017-08-26 NOTE — H&P (View-Only) (Signed)
HPI The patient presents for follow up of atrial flutter.   She is s/p ablation and redo ablation.   Post procedure she had some paroxysms of probable fibrillation though these were very brief period.  She has had lots of atrial ectopy and runs of atrial tachycardia. However, she had no sustained tachyarrhythmias. She had some bradycardic episodes but no sustained bradycardia arrhythmias. There were some junctional beats.  We have managed her medically for this.  She had an echo late last year.  This demonstrated that the MR had increases to moderately severe.  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  The ejection fraction was also reduced 45-50%.  She returns to discuss this.  He denies any new symptoms.  She is not having any palpitations, presyncope or syncope.  She is not having any chest pressure, neck or arm discomfort.  She does stay somewhat active.   Allergies  Allergen Reactions  . Aspirin Nausea And Vomiting  . Oxycodone-Acetaminophen Anxiety    Hallucinations    Current Outpatient Medications  Medication Sig Dispense Refill  . acetaminophen (TYLENOL) 325 MG tablet Take 325 mg by mouth every 6 (six) hours as needed. For pain       . amLODipine (NORVASC) 10 MG tablet TAKE 1 TABLET BY MOUTH ONCE DAILY (NEEDS TO BE SEEN FOR REFILLS) 90 tablet 1  . atorvastatin (LIPITOR) 40 MG tablet Take 1 tablet (40 mg total) by mouth daily. 90 tablet 0  . hydrochlorothiazide (HYDRODIURIL) 25 MG tablet Take 1 tablet (25 mg total) daily by mouth. 90 tablet 1  . spironolactone (ALDACTONE) 25 MG tablet TAKE 1 TABLET BY MOUTH ONCE DAILY 90 tablet 1   No current facility-administered medications for this visit.     Past Medical History:  Diagnosis Date  . Atrial flutter ([place])    Typical by EKG diagnosis 9/11 s/p CIT ablation 11/11  . Bradycardia   . DJD (degenerative joint disease)   . Femur fracture, left ([place]) 2013  . Hyperlipidemia    x5 years  . Hypertension    Since 1997  .  Mitral regurgitation    Mild to moderate  . 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 A FLUTTER ABLATION N/A 06/27/2011   Procedure: ABLATION A FLUTTER;  Surgeon: Thompson Grayer, MD;  Location: [facility] CATH LAB;  Service: Cardiovascular;  Laterality: N/A;  . ATRIAL ABLATION SURGERY  06/2010  . HIP SURGERY     Left (fracture) 3/13  . KNEE ARTHROSCOPY     Right  . LUMBAR SPINE SURGERY    . TOTAL KNEE ARTHROPLASTY     Left  . TUBAL LIGATION      ROS:     As stated in the HPI and negative for all other systems.  PHYSICAL EXAM BP 138/62   Pulse (!) 53   Ht 5\' 3"  (1.6 m)   Wt 159 lb (72.1 kg)   LMP 11/05/1992   BMI 28.17 kg/m   GENERAL:  Well appearing NECK:  No jugular venous distention, waveform within normal limits, carotid upstroke brisk and symmetric, no bruits, no thyromegaly LUNGS:  Clear to auscultation bilaterally CHEST:  Unremarkable HEART:  PMI not displaced or sustained,S1 and S2 within normal limits, no S3, no S4, no clicks, no rubs, 3 out of 6 apical holosystolic murmur with some radiation anteriorly, no diastolic murmurs ABD:  Flat, positive bowel sounds normal in  frequency in pitch, no bruits, no rebound, no guarding, no midline pulsatile mass, no hepatomegaly, no splenomegaly EXT:  2 plus pulses throughout, no edema, no cyanosis no clubbing    EKG:  Sinus rhythm, rate 53 Left axis deviation, left anterior fascicular block, poor anterior R wave progression.  08/28/2017   ASSESSMENT AND PLAN  Atrial flutter -  The patient has had atrial ectopy but no evidence of recurrent flutter.  No change in therapy is indicated.  MITRAL REGURGITATION -  This is increased in severity with now reduced ejection fraction.  She will need a TEE and probably right and left heart catheterization with plan for repair.   HYPERTENSION -  The blood pressure is at target.  No change in therapy is indicated.

## 2017-08-26 NOTE — Progress Notes (Signed)
HPI The patient presents for follow up of atrial flutter.   She is s/p ablation and redo ablation.   Post procedure she had some paroxysms of probable fibrillation though these were very brief period.  She has had lots of atrial ectopy and runs of atrial tachycardia. However, she had no sustained tachyarrhythmias. She had some bradycardic episodes but no sustained bradycardia arrhythmias. There were some junctional beats.  We have managed her medically for this.  She had an echo late last year.  This demonstrated that the MR had increases to moderately severe.  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  The ejection fraction was also reduced 45-50%.  She returns to discuss this.  He denies any new symptoms.  She is not having any palpitations, presyncope or syncope.  She is not having any chest pressure, neck or arm discomfort.  She does stay somewhat active.   Allergies  Allergen Reactions  . Aspirin Nausea And Vomiting  . Oxycodone-Acetaminophen Anxiety    Hallucinations    Current Outpatient Medications  Medication Sig Dispense Refill  . acetaminophen (TYLENOL) 325 MG tablet Take 325 mg by mouth every 6 (six) hours as needed. For pain       . amLODipine (NORVASC) 10 MG tablet TAKE 1 TABLET BY MOUTH ONCE DAILY (NEEDS TO BE SEEN FOR REFILLS) 90 tablet 1  . atorvastatin (LIPITOR) 40 MG tablet Take 1 tablet (40 mg total) by mouth daily. 90 tablet 0  . hydrochlorothiazide (HYDRODIURIL) 25 MG tablet Take 1 tablet (25 mg total) daily by mouth. 90 tablet 1  . spironolactone (ALDACTONE) 25 MG tablet TAKE 1 TABLET BY MOUTH ONCE DAILY 90 tablet 1   No current facility-administered medications for this visit.     Past Medical History:  Diagnosis Date  . Atrial flutter ([place])    Typical by EKG diagnosis 9/11 s/p CIT ablation 11/11  . Bradycardia   . DJD (degenerative joint disease)   . Femur fracture, left ([place]) 2013  . Hyperlipidemia    x5 years  . Hypertension    Since 1997  .  Mitral regurgitation    Mild to moderate  . 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 A FLUTTER ABLATION N/A 06/27/2011   Procedure: ABLATION A FLUTTER;  Surgeon: Thompson Grayer, MD;  Location: [facility] CATH LAB;  Service: Cardiovascular;  Laterality: N/A;  . ATRIAL ABLATION SURGERY  06/2010  . HIP SURGERY     Left (fracture) 3/13  . KNEE ARTHROSCOPY     Right  . LUMBAR SPINE SURGERY    . TOTAL KNEE ARTHROPLASTY     Left  . TUBAL LIGATION      ROS:     As stated in the HPI and negative for all other systems.  PHYSICAL EXAM BP 138/62   Pulse (!) 53   Ht 5\' 3"  (1.6 m)   Wt 159 lb (72.1 kg)   LMP 11/05/1992   BMI 28.17 kg/m   GENERAL:  Well appearing NECK:  No jugular venous distention, waveform within normal limits, carotid upstroke brisk and symmetric, no bruits, no thyromegaly LUNGS:  Clear to auscultation bilaterally CHEST:  Unremarkable HEART:  PMI not displaced or sustained,S1 and S2 within normal limits, no S3, no S4, no clicks, no rubs, 3 out of 6 apical holosystolic murmur with some radiation anteriorly, no diastolic murmurs ABD:  Flat, positive bowel sounds normal in  frequency in pitch, no bruits, no rebound, no guarding, no midline pulsatile mass, no hepatomegaly, no splenomegaly EXT:  2 plus pulses throughout, no edema, no cyanosis no clubbing    EKG:  Sinus rhythm, rate 53 Left axis deviation, left anterior fascicular block, poor anterior R wave progression.  08/28/2017   ASSESSMENT AND PLAN  Atrial flutter -  The patient has had atrial ectopy but no evidence of recurrent flutter.  No change in therapy is indicated.  MITRAL REGURGITATION -  This is increased in severity with now reduced ejection fraction.  She will need a TEE and probably right and left heart catheterization with plan for repair.   HYPERTENSION -  The blood pressure is at target.  No change in therapy is indicated.

## 2017-08-28 ENCOUNTER — Encounter: Payer: Self-pay | Admitting: Cardiology

## 2017-08-28 ENCOUNTER — Ambulatory Visit (INDEPENDENT_AMBULATORY_CARE_PROVIDER_SITE_OTHER): Payer: Medicare Other | Admitting: Cardiology

## 2017-08-28 ENCOUNTER — Other Ambulatory Visit: Payer: Self-pay | Admitting: *Deleted

## 2017-08-28 VITALS — BP 138/62 | HR 53 | Ht 63.0 in | Wt 159.0 lb

## 2017-08-28 DIAGNOSIS — Z01812 Encounter for preprocedural laboratory examination: Secondary | ICD-10-CM | POA: Diagnosis not present

## 2017-08-28 DIAGNOSIS — I340 Nonrheumatic mitral (valve) insufficiency: Secondary | ICD-10-CM

## 2017-08-28 DIAGNOSIS — E785 Hyperlipidemia, unspecified: Secondary | ICD-10-CM

## 2017-08-28 MED ORDER — ATORVASTATIN CALCIUM 40 MG PO TABS: 40.0000 mg | ORAL_TABLET | Freq: Every day | ORAL | 0 refills | Status: DC

## 2017-08-28 NOTE — Patient Instructions (Signed)
Medication Instructions:  Continue current medications  If you need a refill on your cardiac medications before your next appointment, please call your pharmacy.  Labwork: Pre Op labs HERE IN OUR OFFICE AT [facility]  Take the provided lab slips for you to take with you to the lab for you blood draw.   You will NOT need to fast   You may go to any [facility] lab that is convenient for you however, we do have a lab in our office that is able to assist you. You do NOT need an appointment for our lab. Once in our office lobby there is a podium to the right of the check-in desk where you are to sign-in and ring a doorbell to alert [place] you are here. Lab is open Monday-Friday from 8:00am to 4:00pm; and is closed for lunch from 12:45p-1:45pm    Testing/Procedures: Your physician has requested that you have a TEE. During a TEE, sound waves are used to create images of your heart. It provides your doctor with information about the size and shape of your heart and how well your heart's chambers and valves are working. In this test, a transducer is attached to the end of a flexible tube that's guided down your throat and into your esophagus (the tube leading from you mouth to your stomach) to get a more detailed image of your heart. You are not awake for the procedure. Please see the instruction sheet given to you today. For further information please visit [email or web address].  Follow-Up: Your physician wants you to follow-up in: After Test. You should receive a reminder letter in the mail two months in advance. If you do not receive a letter, please call our office [phone].    Thank you for choosing [facility] at [facility]!!

## 2017-09-03 DIAGNOSIS — Z01812 Encounter for preprocedural laboratory examination: Secondary | ICD-10-CM | POA: Diagnosis not present

## 2017-09-03 DIAGNOSIS — I340 Nonrheumatic mitral (valve) insufficiency: Secondary | ICD-10-CM | POA: Diagnosis not present

## 2017-09-04 LAB — CBC
Hematocrit: 42.9 % (ref 34.0–46.6)
Hemoglobin: 14.2 g/dL (ref 11.1–15.9)
MCH: 31.6 pg (ref 26.6–33.0)
MCHC: 33.1 g/dL (ref 31.5–35.7)
MCV: 95 fL (ref 79–97)
Platelets: 250 10*3/uL (ref 150–379)
RBC: 4.5 x10E6/uL (ref 3.77–5.28)
RDW: 14.6 % (ref 12.3–15.4)
WBC: 7.4 10*3/uL (ref 3.4–10.8)

## 2017-09-04 LAB — BASIC METABOLIC PANEL
BUN/Creatinine Ratio: 15 (ref 12–28)
BUN: 23 mg/dL (ref 8–27)
CO2: 23 mmol/L (ref 20–29)
Calcium: 10.1 mg/dL (ref 8.7–10.3)
Chloride: 102 mmol/L (ref 96–106)
Creatinine, Ser: 1.52 mg/dL — ABNORMAL HIGH (ref 0.57–1.00)
GFR calc Af Amer: 38 mL/min/{1.73_m2} — ABNORMAL LOW (ref >59)
GFR calc non Af Amer: 33 mL/min/{1.73_m2} — ABNORMAL LOW (ref >59)
Glucose: 75 mg/dL (ref 65–99)
Potassium: 4.8 mmol/L (ref 3.5–5.2)
Sodium: 143 mmol/L (ref 134–144)

## 2017-09-04 LAB — TSH: TSH: 1.61 u[IU]/mL (ref 0.450–4.500)

## 2017-09-08 ENCOUNTER — Encounter: Payer: Self-pay | Admitting: Cardiology

## 2017-09-08 NOTE — Telephone Encounter (Signed)
Patient calling, states that she is returning call for lab results. Patient will not be available after 2pm today.

## 2017-09-08 NOTE — Telephone Encounter (Signed)
This encounter was created in error - please disregard.

## 2017-09-15 ENCOUNTER — Other Ambulatory Visit: Payer: Self-pay | Admitting: *Deleted

## 2017-09-15 DIAGNOSIS — Z01812 Encounter for preprocedural laboratory examination: Secondary | ICD-10-CM

## 2017-09-18 ENCOUNTER — Ambulatory Visit (HOSPITAL_BASED_OUTPATIENT_CLINIC_OR_DEPARTMENT_OTHER)
Admission: RE | Admit: 2017-09-18 | Discharge: 2017-09-18 | Disposition: A | Discharge status: Home / Self Care | Payer: Medicare Other | Source: Ambulatory Visit | Attending: Cardiovascular Disease | Admitting: Cardiovascular Disease

## 2017-09-18 ENCOUNTER — Encounter (HOSPITAL_COMMUNITY)
Admission: RE | Disposition: A | Discharge status: Home / Self Care | Payer: Self-pay | Source: Ambulatory Visit | Attending: Cardiovascular Disease

## 2017-09-18 ENCOUNTER — Other Ambulatory Visit: Payer: Self-pay | Admitting: Cardiovascular Disease

## 2017-09-18 ENCOUNTER — Ambulatory Visit (HOSPITAL_COMMUNITY)
Admission: RE | Admit: 2017-09-18 | Discharge: 2017-09-18 | Disposition: A | Discharge status: Home / Self Care | Payer: Medicare Other | Source: Ambulatory Visit | Attending: Cardiovascular Disease | Admitting: Cardiovascular Disease

## 2017-09-18 ENCOUNTER — Other Ambulatory Visit: Payer: Self-pay

## 2017-09-18 ENCOUNTER — Encounter (HOSPITAL_COMMUNITY): Payer: Self-pay | Admitting: Cardiovascular Disease

## 2017-09-18 DIAGNOSIS — I081 Rheumatic disorders of both mitral and tricuspid valves: Secondary | ICD-10-CM | POA: Insufficient documentation

## 2017-09-18 DIAGNOSIS — Q211 Atrial septal defect: Secondary | ICD-10-CM | POA: Insufficient documentation

## 2017-09-18 DIAGNOSIS — E785 Hyperlipidemia, unspecified: Secondary | ICD-10-CM | POA: Insufficient documentation

## 2017-09-18 DIAGNOSIS — Z79899 Other long term (current) drug therapy: Secondary | ICD-10-CM | POA: Diagnosis not present

## 2017-09-18 DIAGNOSIS — I340 Nonrheumatic mitral (valve) insufficiency: Secondary | ICD-10-CM

## 2017-09-18 DIAGNOSIS — I4892 Unspecified atrial flutter: Secondary | ICD-10-CM | POA: Diagnosis not present

## 2017-09-18 DIAGNOSIS — Z96652 Presence of left artificial knee joint: Secondary | ICD-10-CM | POA: Insufficient documentation

## 2017-09-18 DIAGNOSIS — Z01812 Encounter for preprocedural laboratory examination: Secondary | ICD-10-CM

## 2017-09-18 DIAGNOSIS — I10 Essential (primary) hypertension: Secondary | ICD-10-CM | POA: Insufficient documentation

## 2017-09-18 HISTORY — PX: TEE WITHOUT CARDIOVERSION: SHX5443

## 2017-09-18 SURGERY — ECHOCARDIOGRAM, TRANSESOPHAGEAL
Anesthesia: Moderate Sedation

## 2017-09-18 MED ORDER — MIDAZOLAM HCL 10 MG/2ML IJ SOLN
INTRAMUSCULAR | Status: DC | PRN
  Administered 2017-09-18: 1 mg via INTRAVENOUS
  Administered 2017-09-18: 2 mg via INTRAVENOUS
  Administered 2017-09-18: 1 mg via INTRAVENOUS

## 2017-09-18 MED ORDER — MIDAZOLAM HCL 5 MG/ML IJ SOLN
INTRAMUSCULAR | Status: AC
  Filled 2017-09-18: qty 2

## 2017-09-18 MED ORDER — SODIUM CHLORIDE 0.9 % IV SOLN
INTRAVENOUS | Status: DC
  Administered 2017-09-18: 11:00:00 via INTRAVENOUS

## 2017-09-18 MED ORDER — BUTAMBEN-TETRACAINE-BENZOCAINE 2-2-14 % EX AERO
INHALATION_SPRAY | CUTANEOUS | Status: DC | PRN
  Administered 2017-09-18: 2 via TOPICAL

## 2017-09-18 MED ORDER — FENTANYL CITRATE (PF) 100 MCG/2ML IJ SOLN
INTRAMUSCULAR | Status: AC
  Filled 2017-09-18: qty 2

## 2017-09-18 MED ORDER — LIDOCAINE VISCOUS 2 % MT SOLN
OROMUCOSAL | Status: AC
  Filled 2017-09-18: qty 15

## 2017-09-18 MED ORDER — DIPHENHYDRAMINE HCL 50 MG/ML IJ SOLN
INTRAMUSCULAR | Status: AC
  Filled 2017-09-18: qty 1

## 2017-09-18 MED ORDER — FENTANYL CITRATE (PF) 100 MCG/2ML IJ SOLN
INTRAMUSCULAR | Status: DC | PRN
  Administered 2017-09-18 (×2): 25 ug via INTRAVENOUS
  Administered 2017-09-18: 12.5 ug via INTRAVENOUS

## 2017-09-18 NOTE — Discharge Instructions (Signed)

## 2017-09-18 NOTE — Interval H&P Note (Signed)
History and Physical Interval Note:  09/18/2017 11:51 AM  Brooke Chavez  has presented today for surgery, with the diagnosis of MITRAL VALVE INSUFFICIENCY  The various methods of treatment have been discussed with the patient and family. After consideration of risks, benefits and other options for treatment, the patient has consented to  Procedure(s): TRANSESOPHAGEAL ECHOCARDIOGRAM (TEE) (N/A) as a surgical intervention .  The patient's history has been reviewed, patient examined, no change in status, stable for surgery.  I have reviewed the patient's chart and labs.  Questions were answered to the patient's satisfaction.     Skeet Latch, MD

## 2017-09-18 NOTE — CV Procedure (Signed)
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    During this procedure the patient is administered a total of Versed 4 mg and Fentanyl 62.5 mcg to achieve and maintain moderate conscious sedation.  The patient's heart rate, blood pressure, and oxygen saturation are monitored continuously during the procedure. The period of conscious sedation is 20 minutes, of which I was present face-to-face 100% of this time.   Mieka Leaton C. Oval Linsey, MD, [facility] 09/18/2017 12:43 PM

## 2017-09-19 NOTE — Progress Notes (Signed)
Please schedule her to have follow up with me in March.  We will need to talk about cardiac cath.  Call [name] with the results and send results to [name], FNP

## 2017-09-21 ENCOUNTER — Encounter (HOSPITAL_COMMUNITY): Payer: Self-pay | Admitting: Cardiovascular Disease

## 2017-09-23 ENCOUNTER — Telehealth: Payer: Self-pay | Admitting: *Deleted

## 2017-09-23 NOTE — Telephone Encounter (Signed)
-----   Message from [name], MD sent at 09/19/2017  6:16 PM EST -----   ----- Message ----- From: Skeet Latch, MD Sent: 09/18/2017   5:30 PM To: [name], MD

## 2017-09-23 NOTE — Telephone Encounter (Signed)
Pt aware of her result appt schedule for March 8th @ 11:40 am

## 2017-10-15 NOTE — Progress Notes (Signed)
HPI The patient presents for follow up of atrial flutter.   She is s/p ablation and redo ablation.   Post procedure she had some paroxysms of probable fibrillation though these were very brief period.  She has had lots of atrial ectopy and runs of atrial tachycardia. However, she had no sustained tachyarrhythmias. She had some bradycardic episodes but no sustained bradycardia arrhythmias. There were some junctional beats.  We have managed her medically for this.  She had an echo late last year.  This demonstrated that the MR had increases to moderately severe.  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  The ejection fraction was also reduced 45-50%. I sent her for a TEE.    This demonstrated moderate prolapse of the anterior leaflet middle segment and medial scallop prolapse of the posterior leaflet.  I reviewed this personally.  She has severe regurgitation.  She does get short of breath if she climbs quickly up a flight of stairs.  However, she is not describing any excessive shortness of breath, PND or orthopnea.  She is not having any palpitations, presyncope or syncope.  She has no chest pressure, neck or arm discomfort.  Allergies  Allergen Reactions  . Aspirin Nausea And Vomiting  . Oxycodone-Acetaminophen Anxiety    Hallucinations    Current Outpatient Medications  Medication Sig Dispense Refill  . acetaminophen (TYLENOL) 500 MG tablet Take 500 mg by mouth every 8 (eight) hours as needed for mild pain or headache.    Marland Kitchen amLODipine (NORVASC) 10 MG tablet TAKE 1 TABLET BY MOUTH ONCE DAILY (NEEDS TO BE SEEN FOR REFILLS) (Patient taking differently: Take 10 mg by mouth at bedtime. (NEEDS TO BE SEEN FOR REFILLS)) 90 tablet 1  . atorvastatin (LIPITOR) 40 MG tablet Take 1 tablet (40 mg total) by mouth daily. (Patient taking differently: Take 40 mg by mouth at bedtime. ) 90 tablet 0  . hydrochlorothiazide (HYDRODIURIL) 25 MG tablet Take 1 tablet (25 mg total) daily by mouth. (Patient taking  differently: Take 25 mg by mouth at bedtime. ) 90 tablet 1  . IRON PO Take 1 tablet by mouth as needed (If iron is low before donating blood will take tablets).     Marland Kitchen spironolactone (ALDACTONE) 25 MG tablet TAKE 1 TABLET BY MOUTH ONCE DAILY (Patient taking differently: Take 25 mg by mouth at bedtime as needed. ) 90 tablet 1  . apixaban (ELIQUIS) 5 MG TABS tablet Take 1 tablet (5 mg total) by mouth 2 (two) times daily. 60 tablet 11   No current facility-administered medications for this visit.     Past Medical History:  Diagnosis Date  . Atrial flutter ([place])    Typical by EKG diagnosis 9/11 s/p CIT ablation 11/11  . Bradycardia   . DJD (degenerative joint disease)   . Femur fracture, left ([place]) 2013  . Hyperlipidemia    x5 years  . Hypertension    Since 1997  . Mitral regurgitation    Mild to moderate  . 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 A FLUTTER ABLATION N/A 06/27/2011   Procedure: ABLATION A FLUTTER;  Surgeon: Thompson Grayer, MD;  Location: [facility] CATH LAB;  Service: Cardiovascular;  Laterality: N/A;  . ATRIAL ABLATION SURGERY  06/2010  . HIP SURGERY     Left (fracture) 3/13  . KNEE ARTHROSCOPY     Right  . LUMBAR SPINE SURGERY    .  TEE WITHOUT CARDIOVERSION N/A 09/18/2017   Procedure: TRANSESOPHAGEAL ECHOCARDIOGRAM (TEE);  Surgeon: Skeet Latch, MD;  Location: [facility];  Service: Cardiovascular;  Laterality: N/A;  . TOTAL KNEE ARTHROPLASTY     Left  . TUBAL LIGATION      ROS:     As stated in the HPI and negative for all other systems.  PHYSICAL EXAM BP 132/68   Pulse 72   Ht 5\' 3"  (1.6 m)   Wt 163 lb 9.6 oz (74.2 kg)   LMP 11/05/1992   BMI 28.98 kg/m   GENERAL:  Well appearing NECK:  No jugular venous distention, waveform within normal limits, carotid upstroke brisk and symmetric, no bruits, no thyromegaly LUNGS:  Clear to auscultation bilaterally CHEST:  Unremarkable HEART:  PMI  not displaced or sustained,S1 and S2 within normal limits, no S3, no clicks, no rubs, 3 out of 6 apical systolic murmur holosystolic and heard best at the apex and axillary area, no diastolic murmurs, irregular ABD:  Flat, positive bowel sounds normal in frequency in pitch, no bruits, no rebound, no guarding, no midline pulsatile mass, no hepatomegaly, no splenomegaly EXT:  2 plus pulses throughout, no edema, no cyanosis no clubbing    EKG:  NA  ASSESSMENT AND PLAN  Atrial flutter -  She was in sinus rhythm when I saw her in January.  However, I noticed that when she came in for her TEE an EKG was done and she was in flutter with variable conduction.  She needs to be on anticoagulation.  She has no significant contraindications and I will start her on Eliquis.  We can consider further therapy in the future.  She has no active bleeding or contraindications.  MITRAL REGURGITATION -  This is severe.  I reviewed the TEE images personally.  I would suspect she has a repairable valve.  I will send her to see Dr. Roxy Manns  to discuss this.  They can then discuss the timing of further testing including cardiac catheterization.    HYPERTENSION -  The blood pressure is reasonably controlled.  This is much better than previous.  She will continue on the meds as listed.

## 2017-10-16 ENCOUNTER — Ambulatory Visit (INDEPENDENT_AMBULATORY_CARE_PROVIDER_SITE_OTHER): Payer: Medicare Other | Admitting: Cardiology

## 2017-10-16 ENCOUNTER — Encounter: Payer: Self-pay | Admitting: Cardiology

## 2017-10-16 VITALS — BP 132/68 | HR 72 | Ht 63.0 in | Wt 163.6 lb

## 2017-10-16 DIAGNOSIS — I10 Essential (primary) hypertension: Secondary | ICD-10-CM | POA: Diagnosis not present

## 2017-10-16 DIAGNOSIS — I483 Typical atrial flutter: Secondary | ICD-10-CM

## 2017-10-16 DIAGNOSIS — I340 Nonrheumatic mitral (valve) insufficiency: Secondary | ICD-10-CM

## 2017-10-16 MED ORDER — APIXABAN 5 MG PO TABS: 5.0000 mg | ORAL_TABLET | Freq: Two times a day (BID) | ORAL | 11 refills | Status: DC

## 2017-10-16 NOTE — Patient Instructions (Addendum)
Medication Instructions:  Continue current medications  If you need a refill on your cardiac medications before your next appointment, please call your pharmacy.  Labwork: None Ordered   Testing/Procedures: None Ordered   Follow-Up: Your physician wants you to follow-up in: Dr Roxy Manns Cardiothoracic Surgeon.     Thank you for choosing [facility] at [facility]!!

## 2017-10-19 ENCOUNTER — Telehealth: Payer: Self-pay | Admitting: Cardiology

## 2017-10-19 NOTE — Telephone Encounter (Signed)
New Message:     Pt says the new medicine called in is too expensive.She says she wants to talk to Dr Percival Spanish about this medicine.

## 2017-10-19 NOTE — Telephone Encounter (Signed)
Spoke with patient and she stated her prescription for Eliquis was over $300 when she went to pick it up. Stated she can not afford that and would like another medication. Did call [facility] and her insurance covered and paid around $150 of the Rx. Have place a 30 day copay card at front desk, 2 weeks samples, and patient assistance application at front for pick up. Will forward to Pharm D for further recommendations.

## 2017-10-19 NOTE — Telephone Encounter (Signed)
Discussed with Pharm D and no further recommendations until we see if patient assistance will be approved.

## 2017-10-19 NOTE — Telephone Encounter (Signed)
Advised patient, verbalized understanding  Will forward to Dr Percival Spanish to see when patient is to return for follow up

## 2017-10-21 NOTE — Telephone Encounter (Signed)
If she cannot be on DOAC she need to start warfarin.  Please make sure this is resolved in the next couple of days.

## 2017-10-23 NOTE — Telephone Encounter (Signed)
New message    Patient called needs to speak with Dr Percival Spanish nurse about the paperwork that was sent to her to fill out for her medication

## 2017-10-23 NOTE — Telephone Encounter (Signed)
Spoke with patient and she wanted to let the office know that her daughter has faxed the paperwork r/e her eliquis to our office.   Patient confirmed that she has eliquis and just received a 30 day supply.

## 2017-10-30 ENCOUNTER — Encounter: Payer: Self-pay | Admitting: Gastroenterology

## 2017-10-30 ENCOUNTER — Ambulatory Visit: Payer: Medicare Other | Admitting: Nurse Practitioner

## 2017-10-30 MED ORDER — APIXABAN 5 MG PO TABS: 5.0000 mg | ORAL_TABLET | Freq: Two times a day (BID) | ORAL | 3 refills | Status: DC

## 2017-10-30 NOTE — Addendum Note (Signed)
Addended by: Vennie Homans on: 10/30/2017 11:26 AM   Modules accepted: Orders

## 2017-10-30 NOTE — Telephone Encounter (Signed)
Called and let pt know we did received form and is awaiting Dr Percival Spanish signature in order for form to be faxed

## 2017-11-02 ENCOUNTER — Other Ambulatory Visit: Payer: Self-pay | Admitting: *Deleted

## 2017-11-02 ENCOUNTER — Encounter: Payer: Self-pay | Admitting: Thoracic Surgery (Cardiothoracic Vascular Surgery)

## 2017-11-02 ENCOUNTER — Institutional Professional Consult (permissible substitution) (INDEPENDENT_AMBULATORY_CARE_PROVIDER_SITE_OTHER): Payer: Medicare Other | Admitting: Thoracic Surgery (Cardiothoracic Vascular Surgery)

## 2017-11-02 ENCOUNTER — Other Ambulatory Visit: Payer: Self-pay

## 2017-11-02 VITALS — BP 150/60 | HR 57 | Resp 18 | Ht 63.0 in | Wt 165.6 lb

## 2017-11-02 DIAGNOSIS — I080 Rheumatic disorders of both mitral and aortic valves: Secondary | ICD-10-CM | POA: Diagnosis not present

## 2017-11-02 DIAGNOSIS — I712 Thoracic aortic aneurysm, without rupture, unspecified: Secondary | ICD-10-CM

## 2017-11-02 DIAGNOSIS — I340 Nonrheumatic mitral (valve) insufficiency: Secondary | ICD-10-CM | POA: Diagnosis not present

## 2017-11-02 DIAGNOSIS — Z01818 Encounter for other preprocedural examination: Secondary | ICD-10-CM

## 2017-11-02 DIAGNOSIS — I7409 Other arterial embolism and thrombosis of abdominal aorta: Secondary | ICD-10-CM

## 2017-11-02 NOTE — H&P (View-Only) (Signed)
[address]       [place] [address]             [facility] REPORT  Referring Provider is [name], MD PCP is [name], FNP  Chief Complaint  Patient presents with  . Mitral Regurgitation    New patient evaluation, TEE 09/18/2017    HPI:  Patient is a 77 year old African-American female with history of mitral regurgitation, atrial flutter status post ablation, hypertension, and degenerative joint disease who has been referred for surgical consultation to discuss treatment option for management of mitral valve prolapse with severe primary mitral regurgitation.  Patient's cardiac history dates back several years ago to 2011 when she was diagnosed with atrial flutter.  She underwent cardioversion and later underwent catheter-based ablation in 2012.  She has been followed regularly ever since by Dr. Percival Spanish.  Previous echocardiograms have demonstrated the presence of normal left ventricular systolic function with mitral valve prolapse and what was felt to be moderate mitral regurgitation.  Follow-up transthoracic echocardiogram performed June 12, 2017 revealed significant change in left ventricular systolic function with ejection fraction estimated at only 45-50%.  She subsequently underwent transesophageal echocardiogram on September 18, 2017 which revealed similar findings, with mild left ventricular systolic dysfunction and ejection fraction estimated 45-50%.  There was mitral valve prolapse with severe mitral regurgitation confirmed by presence of systolic flow reversal in the pulmonary veins.  There was severe left atrial enlargement.  There was a patent foramen ovale.  Right ventricular size and function was normal.  The right atrium was dilated.  There was only mild tricuspid regurgitation.  Cardiothoracic surgical consultation was requested.  The patient is widowed and lives locally in [place] with one  of her daughters and her grandson.  She has been retired since 2000, having previously worked in the [employer].  She has remained reasonably active throughout retirement and continues to work 2 or 3 days a week doing light housework.  She drives an automobile and remains functionally independent.  She describes stable symptoms of exertional shortness of breath.  She states that she gets short of breath when she walks up an incline or tries to go up a flight of stairs.  She denies shortness of breath with low level activity or at rest.  She has not had any PND, or orthopnea.  She reports occasional palpitation.  She has not had dizzy spells or syncope.  She has mild chronic lower extremity edema.  Past Medical History:  Diagnosis Date  . Atrial flutter ([place])    Typical by EKG diagnosis 9/11 s/p CIT ablation 11/11  . Bradycardia   . DJD (degenerative joint disease)   . Femur fracture, left ([place]) 2013  . Hyperlipidemia    x5 years  . Hypertension    Since 1997  . Mitral regurgitation    severe  . 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 A FLUTTER ABLATION N/A 06/27/2011   Procedure: ABLATION A FLUTTER;  Surgeon: Thompson Grayer, MD;  Location: [facility] CATH LAB;  Service: Cardiovascular;  Laterality: N/A;  . ATRIAL ABLATION SURGERY  06/2010  . HIP SURGERY     Left (fracture) 3/13  . KNEE ARTHROSCOPY     Right  . LUMBAR SPINE SURGERY    . TEE WITHOUT CARDIOVERSION N/A 09/18/2017   Procedure: TRANSESOPHAGEAL ECHOCARDIOGRAM (TEE);  Surgeon: Skeet Latch, MD;  Location: [facility];  Service: Cardiovascular;  Laterality: N/A;  . TOTAL KNEE ARTHROPLASTY     Left  . TUBAL LIGATION      Family History  Problem Relation Age of Onset  . Hypertension Mother   . Cancer Sister        leukemia  . Diabetes Brother   . Stroke Sister   . Diabetes Brother   . Heart attack Brother   . Healthy Daughter   . Healthy Son     . Healthy Daughter     Social History   Socioeconomic History  . Marital status: Widowed    Spouse name: Not on file  . Number of children: Not on file  . Years of education: Not on file  . Highest education level: Not on file  Occupational History  . Not on file  Social Needs  . Financial resource strain: Not on file  . Food insecurity:    Worry: Not on file    Inability: Not on file  . Transportation needs:    Medical: Not on file    Non-medical: Not on file  Tobacco Use  . Smoking status: Never Smoker  . Smokeless tobacco: Never Used  Substance and Sexual Activity  . Alcohol use: No  . Drug use: No  . Sexual activity: Never  Lifestyle  . Physical activity:    Days per week: Not on file    Minutes per session: Not on file  . Stress: Not on file  Relationships  . Social connections:    Talks on phone: Not on file    Gets together: Not on file    Attends religious service: Not on file    Active member of club or organization: Not on file    Attends meetings of clubs or organizations: Not on file    Relationship status: Not on file  . Intimate partner violence:    Fear of current or ex partner: Not on file    Emotionally abused: Not on file    Physically abused: Not on file    Forced sexual activity: Not on file  Other Topics Concern  . Not on file  Social History Narrative   Lives in [place]   Her daughter lives with her    Current Outpatient Medications  Medication Sig Dispense Refill  . acetaminophen (TYLENOL) 500 MG tablet Take 500 mg by mouth every 8 (eight) hours as needed for mild pain or headache.    Marland Kitchen amLODipine (NORVASC) 10 MG tablet TAKE 1 TABLET BY MOUTH ONCE DAILY (NEEDS TO BE SEEN FOR REFILLS) (Patient taking differently: Take 10 mg by mouth at bedtime. (NEEDS TO BE SEEN FOR REFILLS)) 90 tablet 1  . apixaban (ELIQUIS) 5 MG TABS tablet Take 1 tablet (5 mg total) by mouth 2 (two) times daily. 180 tablet 3  . atorvastatin (LIPITOR) 40 MG tablet  Take 1 tablet (40 mg total) by mouth daily. (Patient taking differently: Take 40 mg by mouth at bedtime. ) 90 tablet 0  . hydrochlorothiazide (HYDRODIURIL) 25 MG tablet Take 1 tablet (25 mg total) daily by mouth. (Patient taking differently: Take 25 mg by mouth at bedtime. ) 90 tablet 1  . IRON PO Take 1 tablet by mouth as needed (If iron is low before donating blood will take tablets).     Marland Kitchen spironolactone (ALDACTONE) 25 MG tablet TAKE 1 TABLET BY MOUTH ONCE DAILY (Patient taking differently: Take 25 mg by mouth at bedtime  as needed. ) 90 tablet 1   No current facility-administered medications for this visit.     Allergies  Allergen Reactions  . Aspirin Nausea And Vomiting  . Oxycodone-Acetaminophen Anxiety    Hallucinations      Review of Systems:   General:  decreased appetite, normal energy, no weight gain, slight weight loss, no fever  Cardiac:  no chest pain with exertion, no chest pain at rest, + SOB with exertion, no resting SOB, no PND, no orthopnea, + palpitations, + arrhythmia, no atrial fibrillation, + LE edema, no dizzy spells, no syncope  Respiratory:  Mild exertional shortness of breath, no home oxygen, no productive cough, occasional dry cough, no bronchitis, no wheezing, no hemoptysis, no asthma, no pain with inspiration or cough, no sleep apnea, no CPAP at night  GI:   no difficulty swallowing, no reflux, no frequent heartburn, no hiatal hernia, no abdominal pain, no constipation, + occasional diarrhea, no hematochezia, no hematemesis, no melena  GU:   no dysuria,  no frequency, no urinary tract infection, no hematuria, no kidney stones, no kidney disease  Vascular:  no pain suggestive of claudication, no pain in feet, + frequent leg cramps, no varicose veins, no DVT, no non-healing foot ulcer  Neuro:   no stroke, no TIA's, no seizures, no headaches, no temporary blindness one eye,  no slurred speech, no peripheral neuropathy, no chronic pain, no instability of gait, very  mild memory/cognitive dysfunction  Musculoskeletal: + arthritis, no joint swelling, no myalgias, no difficulty walking, normal mobility   Skin:   no rash, no itching, no skin infections, no pressure sores or ulcerations  Psych:   no anxiety, no depression, no nervousness, no unusual recent stress  Eyes:   no blurry vision, no floaters, no recent vision changes, + wears glasses or contacts  ENT:   + hearing loss, no loose or painful teeth, + dentures, last saw dentist within the past year  Hematologic:  no easy bruising, no abnormal bleeding, no clotting disorder, no frequent epistaxis  Endocrine:  no diabetes, does not check CBG's at home     Physical Exam:   BP (!) 150/60 (BP Location: Right Arm, Patient Position: Sitting, Cuff Size: Normal)   Pulse (!) 57   Resp 18   Ht 5\' 3"  (1.6 m)   Wt 165 lb 9.6 oz (75.1 kg)   LMP 11/05/1992   SpO2 97% Comment: RA  BMI 29.33 kg/m   General:  Moderately obese,  well-appearing  HEENT:  Unremarkable   Neck:   no JVD, no bruits, no adenopathy   Chest:   clear to auscultation, symmetrical breath sounds, no wheezes, no rhonchi   CV:   RRR, grade IV/VI late systolic murmur   Abdomen:  soft, non-tender, no masses   Extremities:  warm, well-perfused, pulses diminished, 1+ bilateral LE edema  Rectal/GU  Deferred  Neuro:   Grossly non-focal and symmetrical throughout  Skin:   Clean and dry, no rashes, no breakdown   Diagnostic Tests:  Transthoracic Echocardiography  Patient:    Brooke Chavez, Brooke Chavez MR #:       [id number] Study Date: 06/29/2015 Gender:     F Age:        48 Height:     160 cm Weight:     77.6 kg BSA:        1.88 m^2 Pt. Status: Room:   ATTENDING    [name], MD  [facility]     [name], MD  REFERRING    [name], MD  SONOGRAPHER  Leavy Cella  PERFORMING   [facility]  cc:  ------------------------------------------------------------------- LV EF: 60% -    65%  ------------------------------------------------------------------- Indications:      Atrial flutter 427.32.  ------------------------------------------------------------------- History:   PMH:  Mitral Regurge, Bradycardia, A flutter.  Risk factors:  Hypertension. Dyslipidemia.  ------------------------------------------------------------------- Study Conclusions  - Left ventricle: The cavity size was normal. Wall thickness was   increased in a pattern of mild LVH. Systolic function was normal.   The estimated ejection fraction was in the range of 60% to 65%.   Wall motion was normal; there were no regional wall motion   abnormalities. Left ventricular diastolic function parameters   were normal. - Mitral valve: Mild anterior leaflet prolapse with moderate,   posteriorly directed regurgitation. - Left atrium: The atrium was moderately dilated.  Transthoracic echocardiography.  M-mode, complete 2D, spectral Doppler, and color Doppler.  Birthdate:  Patient birthdate: Apr 09, 1941.  Age:  Patient is 77 yr old.  Sex:  Gender: female. BMI: 30.3 kg/m^2.  Blood pressure:     120/53  Patient status: Outpatient.  Study date:  Study date: 06/29/2015. Study time: 11:27 AM.  Location:  Echo laboratory.  -------------------------------------------------------------------  ------------------------------------------------------------------- Left ventricle:  The cavity size was normal. Wall thickness was increased in a pattern of mild LVH. Systolic function was normal. The estimated ejection fraction was in the range of 60% to 65%. Wall motion was normal; there were no regional wall motion abnormalities. The transmitral flow pattern was normal. The deceleration time of the early transmitral flow velocity was normal. The pulmonary vein flow pattern was normal. The tissue Doppler parameters were normal. Left ventricular diastolic function parameters were  normal.  ------------------------------------------------------------------- Aortic valve:   Structurally normal valve.   Cusp separation was normal.  Doppler:  Transvalvular velocity was within the normal range. There was no stenosis. There was no regurgitation.  ------------------------------------------------------------------- Aorta:  Aortic root: The aortic root was normal in size.  ------------------------------------------------------------------- Mitral valve:  Mild anterior leaflet prolapse with moderate, posteriorly directed regurgitation.  Normal thickness leaflets . Doppler:     Peak gradient (D): 4 mm Hg.  ------------------------------------------------------------------- Left atrium:  The atrium was moderately dilated.  ------------------------------------------------------------------- Atrial septum:  No defect or patent foramen ovale was identified.   ------------------------------------------------------------------- Right ventricle:  The cavity size was normal. Wall thickness was normal. Systolic function was normal.  ------------------------------------------------------------------- Pulmonic valve:   Poorly visualized.  Doppler:  There was no significant regurgitation.  ------------------------------------------------------------------- Tricuspid valve:   Structurally normal valve.   Leaflet separation was normal.  Doppler:  Transvalvular velocity was within the normal range. There was no regurgitation.  ------------------------------------------------------------------- Right atrium:  The atrium was normal in size.  ------------------------------------------------------------------- Pericardium:  There was no pericardial effusion.  ------------------------------------------------------------------- Systemic veins: Inferior vena cava: The vessel was normal in size. The respirophasic diameter changes were in the normal range (>=  50%), consistent with normal central venous pressure.  ------------------------------------------------------------------- Measurements   Left ventricle                         Value        Reference  LV ID, ED, PLAX chordal                51    mm     43 - 52  LV ID, ES, PLAX chordal  35.1  mm     23 - 38  LV fx shortening, PLAX chordal         31    %      >=29  LV PW thickness, ED                    14.7  mm     ---------  IVS/LV PW ratio, ED                    0.66         <=1.3  LV e&', lateral                         11.5  cm/s   ---------  LV E/e&', lateral                       8.39         ---------  LV e&', medial                          9.24  cm/s   ---------  LV E/e&', medial                        10.44        ---------  LV e&', average                         10.37 cm/s   ---------  LV E/e&', average                       9.31         ---------    Ventricular septum                     Value        Reference  IVS thickness, ED                      9.67  mm     ---------    Aorta                                  Value        Reference  Aortic root ID, ED                     31    mm     ---------    Left atrium                            Value        Reference  LA ID, A-P, ES                         42    mm     ---------  LA ID/bsa, A-P                 (H)     2.23  cm/m^2 <=2.2  LA volume, ES, 1-p A4C                 82.7  ml     ---------  LA  volume/bsa, ES, 1-p A4C             43.9  ml/m^2 ---------  LA volume, ES, 1-p A2C                 56.9  ml     ---------  LA volume/bsa, ES, 1-p A2C             30.2  ml/m^2 ---------    Mitral valve                           Value        Reference  Mitral E-wave peak velocity            96.5  cm/s   ---------  Mitral A-wave peak velocity            64.5  cm/s   ---------  Mitral deceleration time               215   ms     150 - 230  Mitral peak gradient, D                4     mm Hg  ---------  Mitral E/A  ratio, peak                 1.5          ---------    Systemic veins                         Value        Reference  Estimated CVP                          3     mm Hg  ---------  Legend: (L)  and  (H)  mark values outside specified reference range.  ------------------------------------------------------------------- Prepared and Electronically Authenticated by  Kate Sable, MD 2016-11-18T17:58:39   Transthoracic Echocardiography  Patient:    Alvera, Tourigny MR #:       [id number] Study Date: 06/12/2017 Gender:     F Age:        73 Height:     157.5 cm Weight:     73.9 kg BSA:        1.82 m^2 Pt. Status: Room:   ATTENDING    [name], MD  ORDERING     [name], MD  REFERRING    [name], MD  SONOGRAPHER  Cindy Hazy, RDCS  PERFORMING   [facility]  cc:  ------------------------------------------------------------------- LV EF: 45% -   50%  ------------------------------------------------------------------- Indications:      I05.9 Mitral Valve Disorder.  ------------------------------------------------------------------- History:   PMH:  Acquired from the patient and from the patient&'s chart.  PMH:  Atrial Flutter. Tricuspid and Mitral Regurgitation. Bradycardia. Degenerative joint disease.  Risk factors: Hypertension. Dyslipidemia.  ------------------------------------------------------------------- Study Conclusions  - Left ventricle: The cavity size was normal. There was mild   concentric hypertrophy. Systolic function was mildly reduced. The   estimated ejection fraction was in the range of 45% to 50%. Wall   motion was normal; there were no regional wall motion   abnormalities. - Aortic valve: Trileaflet; normal thickness leaflets. There was no   regurgitation. - Mitral valve: Thickened mitral valve with prolapse of the   anterior leaflet and posteriorly directed jet of mitral   regurgitation. There  was mild  regurgitation directed posteriorly. - Left atrium: The atrium was severely dilated. - Right ventricle: The cavity size was normal. Wall thickness was   normal. Systolic function was normal. - Right atrium: The atrium was moderately dilated. - Tricuspid valve: There was mild regurgitation. - Pulmonary arteries: Systolic pressure was moderately increased.   PA peak pressure: 50 mm Hg (S).  Impressions:  - When compared to the prior study from 06/29/2015 there are   significant changes.   LVEF has dropped from 60-65% to 45-50%. Thickened mitral valve   with prolapse of the anterior leaflet and posteriorly directed   jet of mitral regurgitation. Mitral regurgitation is at least   moderate, most probably severe. 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   A TEE is recommended for further evaluation, surgical consult   should be considered.  ------------------------------------------------------------------- Study data:   Study status:  Routine.  Procedure:  The patient reported no pain pre or post test. Transthoracic echocardiography for left ventricular function evaluation, for right ventricular function evaluation, and for assessment of valvular function. Image quality was adequate.  Study completion:  There were no complications.          Transthoracic echocardiography.  M-mode, complete 2D, spectral Doppler, and color Doppler.  Birthdate: Patient birthdate: 04-14-1941.  Age:  Patient is 77 yr old.  Sex: Gender: female.    BMI: 29.8 kg/m^2.  Patient status:  Outpatient. Study date:  Study date: 06/12/2017. Study time: 01:12 PM. Location:  [facility]  -------------------------------------------------------------------  ------------------------------------------------------------------- Left ventricle:  The cavity size was normal. There was mild concentric hypertrophy. Systolic function was mildly reduced. The estimated  ejection fraction was in the range of 45% to 50%. Wall motion was normal; there were no regional wall motion abnormalities. The study was not technically sufficient to allow evaluation of LV diastolic dysfunction due to atrial fibrillation.   ------------------------------------------------------------------- Aortic valve:   Trileaflet; normal thickness leaflets. Mobility was not restricted.  Doppler:  Transvalvular velocity was within the normal range. There was no stenosis. There was no regurgitation.   ------------------------------------------------------------------- Aorta:  Aortic root: The aortic root was normal in size.  ------------------------------------------------------------------- Mitral valve:  Thickened mitral valve with prolapse of the anterior leaflet and posteriorly directed jet of mitral regurgitation. Mobility was not restricted.  Doppler:  Transvalvular velocity was within the normal range. There was no evidence for stenosis. There was mild regurgitation directed posteriorly.    Peak gradient (D): 4 mm Hg.  ------------------------------------------------------------------- Left atrium:  The atrium was severely dilated.  ------------------------------------------------------------------- Right ventricle:  The cavity size was normal. Wall thickness was normal. Systolic function was normal.  ------------------------------------------------------------------- Pulmonic valve:    Structurally normal valve.   Cusp separation was normal.  Doppler:  Transvalvular velocity was within the normal range. There was no evidence for stenosis. There was no regurgitation.  ------------------------------------------------------------------- Tricuspid valve:   Structurally normal valve.    Doppler: Transvalvular velocity was within the normal range. There was mild regurgitation.  ------------------------------------------------------------------- Pulmonary artery:    The main pulmonary artery was normal-sized. Systolic pressure was moderately increased.  ------------------------------------------------------------------- Right atrium:  The atrium was moderately dilated.  ------------------------------------------------------------------- Pericardium:  There was no pericardial effusion.  ------------------------------------------------------------------- Systemic veins: Inferior vena cava: The vessel was normal in size.  ------------------------------------------------------------------- Measurements   Left ventricle  Value        Reference  LV ID, ED, PLAX chordal        (H)     52.7  mm     43 - 52  LV ID, ES, PLAX chordal                33.3  mm     23 - 38  LV fx shortening, PLAX chordal         37    %      >=29  LV PW thickness, ED                    12.9  mm     ---------  IVS/LV PW ratio, ED                    0.7          <=1.3  Stroke volume, 2D                      74    ml     ---------  Stroke volume/bsa, 2D                  41    ml/m^2 ---------  LV e&', lateral                         16.4  cm/s   ---------  LV E/e&', lateral                       6.1          ---------  LV e&', medial                          9.76  cm/s   ---------  LV E/e&', medial                        10.25        ---------  LV e&', average                         13.08 cm/s   ---------  LV E/e&', average                       7.65         ---------    Ventricular septum                     Value        Reference  IVS thickness, ED                      9.09  mm     ---------    LVOT                                   Value        Reference  LVOT ID, S                             22    mm     ---------  LVOT area  3.8   cm^2   ---------  LVOT ID                                22    mm     ---------  LVOT peak velocity, S                  79.4  cm/s   ---------  LVOT mean velocity, S                   52.7  cm/s   ---------  LVOT VTI, S                            19.4  cm     ---------  LVOT peak gradient, S                  3     mm Hg  ---------  Stroke volume (SV), LVOT DP            73.7  ml     ---------  Stroke index (SV/bsa), LVOT DP         40.4  ml/m^2 ---------    Aorta                                  Value        Reference  Aortic root ID, ED                     33    mm     ---------    Left atrium                            Value        Reference  LA ID, A-P, ES                         52    mm     ---------  LA ID/bsa, A-P                 (H)     2.85  cm/m^2 <=2.2  LA volume, S                           99    ml     ---------  LA volume/bsa, S                       54.3  ml/m^2 ---------  LA volume, ES, 1-p A4C                 104   ml     ---------  LA volume/bsa, ES, 1-p A4C             57    ml/m^2 ---------  LA volume, ES, 1-p A2C                 90    ml     ---------  LA volume/bsa, ES, 1-p A2C             49.3  ml/m^2 ---------    Mitral valve  Value        Reference  Mitral E-wave peak velocity            100   cm/s   ---------  Mitral peak gradient, D                4     mm Hg  ---------    Pulmonary arteries                     Value        Reference  PA pressure, S, DP             (H)     50    mm Hg  <=30    Tricuspid valve                        Value        Reference  Tricuspid regurg peak velocity         342   cm/s   ---------  Tricuspid peak RV-RA gradient          47    mm Hg  ---------    Right ventricle                        Value        Reference  RV s&', lateral, S                      15.36 cm/s   ---------  Legend: (L)  and  (H)  mark values outside specified reference range.  ------------------------------------------------------------------- Prepared and Electronically Authenticated by  Ena Dawley, M.D. 2018-11-02T14:35:11   Transesophageal Echocardiography  Patient:    Leslee, Suire MR #:        [id number] Study Date: 09/18/2017 Gender:     F Age:        15 Height:     160 cm Weight:     72.6 kg BSA:        1.82 m^2 Pt. Status: Room:   SONOGRAPHER  [facility]  ADMITTING    Skeet Latch, MD  ATTENDING    Skeet Latch, MD  ORDERING     Skeet Latch, MD  PERFORMING   Skeet Latch, MD  [place], MD  cc:  ------------------------------------------------------------------- LV EF: 45% -   50%  ------------------------------------------------------------------- Indications:      Mitral valve prolapse 424.0.  ------------------------------------------------------------------- History:   PMH:   Atrial flutter.  Mitral valve disease.  Risk factors:  Hypertension. Dyslipidemia.  ------------------------------------------------------------------- Study Conclusions  - Left ventricle: Systolic function was mildly reduced. The   estimated ejection fraction was in the range of 45% to 50%. Wall   motion was normal; there were no regional wall motion   abnormalities. - Aortic valve: There was trivial regurgitation. - Mitral valve: Elongation, consistent with myxomatous   proliferation. Moderate prolapse, involving the middle segment of   the anterior leaflet and the medial scallop of the posterior   leaflet. There was severe regurgitation directed eccentrically   and posteriorly. Severe regurgitation is suggested by pulmonary   vein systolic flow reversal. - Left atrium: The atrium was severely dilated. No evidence of   thrombus in the atrial cavity or appendage. No evidence of   thrombus in the atrial cavity or appendage. - Right ventricle: Systolic function was normal. - Right atrium: The  atrium was severely dilated. No evidence of   thrombus in the atrial cavity or appendage. - Atrial septum: There was a patent foramen ovale with left to   right flow at rest. - Tricuspid valve: There was mild regurgitation. Peak RV-RA    gradient (S): 41 mm Hg.  ------------------------------------------------------------------- Study data:   Study status:  Routine.  Consent:  The risks, benefits, and alternatives to the procedure were explained to the patient and informed consent was obtained.  Procedure:  The patient reported no pain pre or post test. Initial setup. The patient was brought to the laboratory. Surface ECG leads were monitored. Sedation. Conscious sedation was administered by cardiology staff. Transesophageal echocardiography. Topical anesthesia was obtained using viscous lidocaine. An adult multiplane transesophageal probe was inserted by the attending cardiologistwithout difficulty. Image quality was adequate.  Study completion:  The patient tolerated the procedure well. There were no complications.  Administered medications:   Fentanyl, 62.65mcg, IV.  Midazolam, 4mg , IV. Diagnostic transesophageal echocardiography.  2D and color Doppler.  Birthdate:  Patient birthdate: 1940/10/29.  Age:  Patient is 77 yr old.  Sex:  Gender: female.    BMI: 28.4 kg/m^2.  Blood pressure:   171/67  Patient status:  Outpatient.  Study date:  Study date: 09/18/2017. Study time: 11:44 AM.  Location:  Endoscopy.  -------------------------------------------------------------------  ------------------------------------------------------------------- Left ventricle:  Systolic function was mildly reduced. The estimated ejection fraction was in the range of 45% to 50%. Wall motion was normal; there were no regional wall motion abnormalities.  ------------------------------------------------------------------- Aortic valve:   Structurally normal valve. Trileaflet; normal thickness leaflets. Cusp separation was normal.  Doppler:  There was trivial regurgitation.  ------------------------------------------------------------------- Aorta:  There was no atheroma. There was no evidence for dissection. Aortic root: The aortic  root was not dilated. Ascending aorta: The ascending aorta was normal in size. Aortic arch: The aortic arch was normal in size. Descending aorta: The descending aorta was normal in size.  ------------------------------------------------------------------- Mitral valve:   Elongation, consistent with myxomatous proliferation. Leaflet separation was normal.  Moderate prolapse, involving the middle segment of the anterior leaflet and the medial scallop of the posterior leaflet.  Doppler:  There was severe regurgitation directed eccentrically and posteriorly. Severe regurgitation is suggested by pulmonary vein systolic flow reversal.  ------------------------------------------------------------------- Left atrium:  The atrium was severely dilated.  No evidence of thrombus in the atrial cavity or appendage.  No evidence of thrombus in the atrial cavity or appendage. The appendage was morphologically a left appendage, multilobulated, and of normal size. Emptying velocity was normal.  ------------------------------------------------------------------- Atrial septum:  There was a patent foramen ovale with left to right flow at rest.  ------------------------------------------------------------------- Right ventricle:  The cavity size was normal. Wall thickness was normal. Systolic function was normal.  ------------------------------------------------------------------- Pulmonic valve:    Structurally normal valve.  ------------------------------------------------------------------- Tricuspid valve:   Structurally normal valve.   Leaflet separation was normal.  Doppler:  There was mild regurgitation.  ------------------------------------------------------------------- Pulmonary artery:   The main pulmonary artery was normal-sized.  ------------------------------------------------------------------- Right atrium:  The atrium was severely dilated.  No evidence of thrombus in the  atrial cavity or appendage. The appendage was morphologically a right appendage.  ------------------------------------------------------------------- Pericardium:  There was no pericardial effusion.  ------------------------------------------------------------------- Measurements   Mitral valve                                  Value  Mitral regurg  VTI, PISA                       207    cm  Mitral ERO, PISA                              0.3    cm^2  Mitral regurg volume, PISA                    62     ml    Tricuspid valve                               Value  Tricuspid regurg peak velocity                318.45 cm/s  Tricuspid peak RV-RA gradient                 41     mm Hg  Tricuspid maximal regurg velocity, PISA       318.45 cm/s  Legend: (L)  and  (H)  mark values outside specified reference range.  ------------------------------------------------------------------- Prepared and Electronically Authenticated by  Skeet Latch, MD 2019-02-08T17:17:53   Impression:  Patient has mitral valve prolapse with stage D severe symptomatic primary mitral regurgitation.  She describes stable symptoms of exertional shortness of breath that occur only with more strenuous physical exertion such as walking up a flight of stairs or walking up an incline, consistent with chronic diastolic congestive heart failure [place] Heart Association functional class II.  I have personally reviewed the patient's recent transthoracic and transesophageal echocardiograms.  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  There are no obvious ruptured primary chordae tendinae but there is severe elongation and prolapse involving a portion of the middle scallop of the anterior leaflet.  There is an eccentric jet of regurgitation that courses posteriorly around the dilated left atrium.  There was flow reversal in the pulmonary veins.  There is  moderate left ventricular hypertrophy and mild left ventricular systolic dysfunction with ejection fraction estimated only 45-50% in the setting of severe mitral regurgitation.  I agree the patient needs elective mitral valve repair.   Plan:  The patient and one of her daughters were counseled at length regarding the indications, risks and potential benefits of mitral valve repair.  The rationale for elective surgery has been explained, including a comparison between surgery and continued medical therapy with close follow-up.  Long-term prognosis with continued medical therapy was discussed.  Expectations for her postoperative convalescence were discussed.  Alternative surgical approaches were discussed including a comparison between conventional median sternotomy and minimally invasive approach.  The patient is interested in proceeding with surgery in the near future.  As a next step the patient will need to undergo left and right heart catheterization.  In the absence of significant coronary artery disease she might be an acceptable candidate for minimally invasive approach for surgery.  As such she will undergo CT angiography to evaluate the feasibility of peripheral arterial cannulation for surgery.  We tentatively plan to proceed with minimally invasive mitral valve repair on November 24, 2017.  The patient will return to our office for follow-up prior to surgery on November 16, 2017.  At that time we will discuss timing of when she should stop taking Eliquis  in anticipation of surgery.    I spent in excess of 90 minutes during the conduct of this office consultation and >50% of this time involved direct face-to-face encounter with the patient for counseling and/or coordination of their care.    Valentina Gu. Roxy Manns, MD 11/02/2017 3:22 PM

## 2017-11-02 NOTE — Patient Instructions (Signed)
Continue all previous medications without any changes at this time  

## 2017-11-02 NOTE — Progress Notes (Signed)
[address]       [place] [address]             [facility] REPORT  Referring Provider is [name], MD PCP is [name], FNP  Chief Complaint  Patient presents with  . Mitral Regurgitation    New patient evaluation, TEE 09/18/2017    HPI:  Patient is a 77 year old African-American female with history of mitral regurgitation, atrial flutter status post ablation, hypertension, and degenerative joint disease who has been referred for surgical consultation to discuss treatment option for management of mitral valve prolapse with severe primary mitral regurgitation.  Patient's cardiac history dates back several years ago to 2011 when she was diagnosed with atrial flutter.  She underwent cardioversion and later underwent catheter-based ablation in 2012.  She has been followed regularly ever since by Dr. Percival Spanish.  Previous echocardiograms have demonstrated the presence of normal left ventricular systolic function with mitral valve prolapse and what was felt to be moderate mitral regurgitation.  Follow-up transthoracic echocardiogram performed June 12, 2017 revealed significant change in left ventricular systolic function with ejection fraction estimated at only 45-50%.  She subsequently underwent transesophageal echocardiogram on September 18, 2017 which revealed similar findings, with mild left ventricular systolic dysfunction and ejection fraction estimated 45-50%.  There was mitral valve prolapse with severe mitral regurgitation confirmed by presence of systolic flow reversal in the pulmonary veins.  There was severe left atrial enlargement.  There was a patent foramen ovale.  Right ventricular size and function was normal.  The right atrium was dilated.  There was only mild tricuspid regurgitation.  Cardiothoracic surgical consultation was requested.  The patient is widowed and lives locally in [place] with one  of her daughters and her grandson.  She has been retired since 2000, having previously worked in the [employer].  She has remained reasonably active throughout retirement and continues to work 2 or 3 days a week doing light housework.  She drives an automobile and remains functionally independent.  She describes stable symptoms of exertional shortness of breath.  She states that she gets short of breath when she walks up an incline or tries to go up a flight of stairs.  She denies shortness of breath with low level activity or at rest.  She has not had any PND, or orthopnea.  She reports occasional palpitation.  She has not had dizzy spells or syncope.  She has mild chronic lower extremity edema.  Past Medical History:  Diagnosis Date  . Atrial flutter ([name])    Typical by EKG diagnosis 9/11 s/p CIT ablation 11/11  . Bradycardia   . DJD (degenerative joint disease)   . Femur fracture, left ([name]) 2013  . Hyperlipidemia    x5 years  . Hypertension    Since 1997  . Mitral regurgitation    severe  . 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 A FLUTTER ABLATION N/A 06/27/2011   Procedure: ABLATION A FLUTTER;  Surgeon: Thompson Grayer, MD;  Location: [facility] CATH LAB;  Service: Cardiovascular;  Laterality: N/A;  . ATRIAL ABLATION SURGERY  06/2010  . HIP SURGERY     Left (fracture) 3/13  . KNEE ARTHROSCOPY     Right  . LUMBAR SPINE SURGERY    . TEE WITHOUT CARDIOVERSION N/A 09/18/2017   Procedure: TRANSESOPHAGEAL ECHOCARDIOGRAM (TEE);  Surgeon: Skeet Latch, MD;  Location: [facility];  Service: Cardiovascular;  Laterality: N/A;  . TOTAL KNEE ARTHROPLASTY     Left  . TUBAL LIGATION      Family History  Problem Relation Age of Onset  . Hypertension Mother   . Cancer Sister        leukemia  . Diabetes Brother   . Stroke Sister   . Diabetes Brother   . Heart attack Brother   . Healthy Daughter   . Healthy Son     . Healthy Daughter     Social History   Socioeconomic History  . Marital status: Widowed    Spouse name: Not on file  . Number of children: Not on file  . Years of education: Not on file  . Highest education level: Not on file  Occupational History  . Not on file  Social Needs  . Financial resource strain: Not on file  . Food insecurity:    Worry: Not on file    Inability: Not on file  . Transportation needs:    Medical: Not on file    Non-medical: Not on file  Tobacco Use  . Smoking status: Never Smoker  . Smokeless tobacco: Never Used  Substance and Sexual Activity  . Alcohol use: No  . Drug use: No  . Sexual activity: Never  Lifestyle  . Physical activity:    Days per week: Not on file    Minutes per session: Not on file  . Stress: Not on file  Relationships  . Social connections:    Talks on phone: Not on file    Gets together: Not on file    Attends religious service: Not on file    Active member of club or organization: Not on file    Attends meetings of clubs or organizations: Not on file    Relationship status: Not on file  . Intimate partner violence:    Fear of current or ex partner: Not on file    Emotionally abused: Not on file    Physically abused: Not on file    Forced sexual activity: Not on file  Other Topics Concern  . Not on file  Social History Narrative   Lives in [place]   Her daughter lives with her    Current Outpatient Medications  Medication Sig Dispense Refill  . acetaminophen (TYLENOL) 500 MG tablet Take 500 mg by mouth every 8 (eight) hours as needed for mild pain or headache.    Marland Kitchen amLODipine (NORVASC) 10 MG tablet TAKE 1 TABLET BY MOUTH ONCE DAILY (NEEDS TO BE SEEN FOR REFILLS) (Patient taking differently: Take 10 mg by mouth at bedtime. (NEEDS TO BE SEEN FOR REFILLS)) 90 tablet 1  . apixaban (ELIQUIS) 5 MG TABS tablet Take 1 tablet (5 mg total) by mouth 2 (two) times daily. 180 tablet 3  . atorvastatin (LIPITOR) 40 MG tablet  Take 1 tablet (40 mg total) by mouth daily. (Patient taking differently: Take 40 mg by mouth at bedtime. ) 90 tablet 0  . hydrochlorothiazide (HYDRODIURIL) 25 MG tablet Take 1 tablet (25 mg total) daily by mouth. (Patient taking differently: Take 25 mg by mouth at bedtime. ) 90 tablet 1  . IRON PO Take 1 tablet by mouth as needed (If iron is low before donating blood will take tablets).     Marland Kitchen spironolactone (ALDACTONE) 25 MG tablet TAKE 1 TABLET BY MOUTH ONCE DAILY (Patient taking differently: Take 25 mg by mouth at bedtime  as needed. ) 90 tablet 1   No current facility-administered medications for this visit.     Allergies  Allergen Reactions  . Aspirin Nausea And Vomiting  . Oxycodone-Acetaminophen Anxiety    Hallucinations      Review of Systems:   General:  decreased appetite, normal energy, no weight gain, slight weight loss, no fever  Cardiac:  no chest pain with exertion, no chest pain at rest, + SOB with exertion, no resting SOB, no PND, no orthopnea, + palpitations, + arrhythmia, no atrial fibrillation, + LE edema, no dizzy spells, no syncope  Respiratory:  Mild exertional shortness of breath, no home oxygen, no productive cough, occasional dry cough, no bronchitis, no wheezing, no hemoptysis, no asthma, no pain with inspiration or cough, no sleep apnea, no CPAP at night  GI:   no difficulty swallowing, no reflux, no frequent heartburn, no hiatal hernia, no abdominal pain, no constipation, + occasional diarrhea, no hematochezia, no hematemesis, no melena  GU:   no dysuria,  no frequency, no urinary tract infection, no hematuria, no kidney stones, no kidney disease  Vascular:  no pain suggestive of claudication, no pain in feet, + frequent leg cramps, no varicose veins, no DVT, no non-healing foot ulcer  Neuro:   no stroke, no TIA's, no seizures, no headaches, no temporary blindness one eye,  no slurred speech, no peripheral neuropathy, no chronic pain, no instability of gait, very  mild memory/cognitive dysfunction  Musculoskeletal: + arthritis, no joint swelling, no myalgias, no difficulty walking, normal mobility   Skin:   no rash, no itching, no skin infections, no pressure sores or ulcerations  Psych:   no anxiety, no depression, no nervousness, no unusual recent stress  Eyes:   no blurry vision, no floaters, no recent vision changes, + wears glasses or contacts  ENT:   + hearing loss, no loose or painful teeth, + dentures, last saw dentist within the past year  Hematologic:  no easy bruising, no abnormal bleeding, no clotting disorder, no frequent epistaxis  Endocrine:  no diabetes, does not check CBG's at home     Physical Exam:   BP (!) 150/60 (BP Location: Right Arm, Patient Position: Sitting, Cuff Size: Normal)   Pulse (!) 57   Resp 18   Ht 5\' 3"  (1.6 m)   Wt 165 lb 9.6 oz (75.1 kg)   LMP 11/05/1992   SpO2 97% Comment: RA  BMI 29.33 kg/m   General:  Moderately obese,  well-appearing  HEENT:  Unremarkable   Neck:   no JVD, no bruits, no adenopathy   Chest:   clear to auscultation, symmetrical breath sounds, no wheezes, no rhonchi   CV:   RRR, grade IV/VI late systolic murmur   Abdomen:  soft, non-tender, no masses   Extremities:  warm, well-perfused, pulses diminished, 1+ bilateral LE edema  Rectal/GU  Deferred  Neuro:   Grossly non-focal and symmetrical throughout  Skin:   Clean and dry, no rashes, no breakdown   Diagnostic Tests:  Transthoracic Echocardiography  Patient:    Mardee, Clune MR #:       [id number] Study Date: 06/29/2015 Gender:     F Age:        6 Height:     160 cm Weight:     77.6 kg BSA:        1.88 m^2 Pt. Status: Room:   ATTENDING    [name], MD  [facility]     [name], MD  REFERRING    [name], MD  SONOGRAPHER  Leavy Cella  PERFORMING   [facility]  cc:  ------------------------------------------------------------------- LV EF: 60% -    65%  ------------------------------------------------------------------- Indications:      Atrial flutter 427.32.  ------------------------------------------------------------------- History:   PMH:  Mitral Regurge, Bradycardia, A flutter.  Risk factors:  Hypertension. Dyslipidemia.  ------------------------------------------------------------------- Study Conclusions  - Left ventricle: The cavity size was normal. Wall thickness was   increased in a pattern of mild LVH. Systolic function was normal.   The estimated ejection fraction was in the range of 60% to 65%.   Wall motion was normal; there were no regional wall motion   abnormalities. Left ventricular diastolic function parameters   were normal. - Mitral valve: Mild anterior leaflet prolapse with moderate,   posteriorly directed regurgitation. - Left atrium: The atrium was moderately dilated.  Transthoracic echocardiography.  M-mode, complete 2D, spectral Doppler, and color Doppler.  Birthdate:  Patient birthdate: 06-11-1941.  Age:  Patient is 77 yr old.  Sex:  Gender: female. BMI: 30.3 kg/m^2.  Blood pressure:     120/53  Patient status: Outpatient.  Study date:  Study date: 06/29/2015. Study time: 11:27 AM.  Location:  Echo laboratory.  -------------------------------------------------------------------  ------------------------------------------------------------------- Left ventricle:  The cavity size was normal. Wall thickness was increased in a pattern of mild LVH. Systolic function was normal. The estimated ejection fraction was in the range of 60% to 65%. Wall motion was normal; there were no regional wall motion abnormalities. The transmitral flow pattern was normal. The deceleration time of the early transmitral flow velocity was normal. The pulmonary vein flow pattern was normal. The tissue Doppler parameters were normal. Left ventricular diastolic function parameters were  normal.  ------------------------------------------------------------------- Aortic valve:   Structurally normal valve.   Cusp separation was normal.  Doppler:  Transvalvular velocity was within the normal range. There was no stenosis. There was no regurgitation.  ------------------------------------------------------------------- Aorta:  Aortic root: The aortic root was normal in size.  ------------------------------------------------------------------- Mitral valve:  Mild anterior leaflet prolapse with moderate, posteriorly directed regurgitation.  Normal thickness leaflets . Doppler:     Peak gradient (D): 4 mm Hg.  ------------------------------------------------------------------- Left atrium:  The atrium was moderately dilated.  ------------------------------------------------------------------- Atrial septum:  No defect or patent foramen ovale was identified.   ------------------------------------------------------------------- Right ventricle:  The cavity size was normal. Wall thickness was normal. Systolic function was normal.  ------------------------------------------------------------------- Pulmonic valve:   Poorly visualized.  Doppler:  There was no significant regurgitation.  ------------------------------------------------------------------- Tricuspid valve:   Structurally normal valve.   Leaflet separation was normal.  Doppler:  Transvalvular velocity was within the normal range. There was no regurgitation.  ------------------------------------------------------------------- Right atrium:  The atrium was normal in size.  ------------------------------------------------------------------- Pericardium:  There was no pericardial effusion.  ------------------------------------------------------------------- Systemic veins: Inferior vena cava: The vessel was normal in size. The respirophasic diameter changes were in the normal range (>=  50%), consistent with normal central venous pressure.  ------------------------------------------------------------------- Measurements   Left ventricle                         Value        Reference  LV ID, ED, PLAX chordal                51    mm     43 - 52  LV ID, ES, PLAX chordal  35.1  mm     23 - 38  LV fx shortening, PLAX chordal         31    %      >=29  LV PW thickness, ED                    14.7  mm     ---------  IVS/LV PW ratio, ED                    0.66         <=1.3  LV e&', lateral                         11.5  cm/s   ---------  LV E/e&', lateral                       8.39         ---------  LV e&', medial                          9.24  cm/s   ---------  LV E/e&', medial                        10.44        ---------  LV e&', average                         10.37 cm/s   ---------  LV E/e&', average                       9.31         ---------    Ventricular septum                     Value        Reference  IVS thickness, ED                      9.67  mm     ---------    Aorta                                  Value        Reference  Aortic root ID, ED                     31    mm     ---------    Left atrium                            Value        Reference  LA ID, A-P, ES                         42    mm     ---------  LA ID/bsa, A-P                 (H)     2.23  cm/m^2 <=2.2  LA volume, ES, 1-p A4C                 82.7  ml     ---------  LA  volume/bsa, ES, 1-p A4C             43.9  ml/m^2 ---------  LA volume, ES, 1-p A2C                 56.9  ml     ---------  LA volume/bsa, ES, 1-p A2C             30.2  ml/m^2 ---------    Mitral valve                           Value        Reference  Mitral E-wave peak velocity            96.5  cm/s   ---------  Mitral A-wave peak velocity            64.5  cm/s   ---------  Mitral deceleration time               215   ms     150 - 230  Mitral peak gradient, D                4     mm Hg  ---------  Mitral E/A  ratio, peak                 1.5          ---------    Systemic veins                         Value        Reference  Estimated CVP                          3     mm Hg  ---------  Legend: (L)  and  (H)  mark values outside specified reference range.  ------------------------------------------------------------------- Prepared and Electronically Authenticated by  Kate Sable, MD 2016-11-18T17:58:39   Transthoracic Echocardiography  Patient:    Brooke Chavez, Brooke Chavez MR #:       [id number] Study Date: 06/12/2017 Gender:     F Age:        37 Height:     157.5 cm Weight:     73.9 kg BSA:        1.82 m^2 Pt. Status: Room:   ATTENDING    [name], MD  ORDERING     [name], MD  REFERRING    [name], MD  SONOGRAPHER  Cindy Hazy, RDCS  PERFORMING   [facility]  cc:  ------------------------------------------------------------------- LV EF: 45% -   50%  ------------------------------------------------------------------- Indications:      I05.9 Mitral Valve Disorder.  ------------------------------------------------------------------- History:   PMH:  Acquired from the patient and from the patient&'s chart.  PMH:  Atrial Flutter. Tricuspid and Mitral Regurgitation. Bradycardia. Degenerative joint disease.  Risk factors: Hypertension. Dyslipidemia.  ------------------------------------------------------------------- Study Conclusions  - Left ventricle: The cavity size was normal. There was mild   concentric hypertrophy. Systolic function was mildly reduced. The   estimated ejection fraction was in the range of 45% to 50%. Wall   motion was normal; there were no regional wall motion   abnormalities. - Aortic valve: Trileaflet; normal thickness leaflets. There was no   regurgitation. - Mitral valve: Thickened mitral valve with prolapse of the   anterior leaflet and posteriorly directed jet of mitral   regurgitation. There  was mild  regurgitation directed posteriorly. - Left atrium: The atrium was severely dilated. - Right ventricle: The cavity size was normal. Wall thickness was   normal. Systolic function was normal. - Right atrium: The atrium was moderately dilated. - Tricuspid valve: There was mild regurgitation. - Pulmonary arteries: Systolic pressure was moderately increased.   PA peak pressure: 50 mm Hg (S).  Impressions:  - When compared to the prior study from 06/29/2015 there are   significant changes.   LVEF has dropped from 60-65% to 45-50%. Thickened mitral valve   with prolapse of the anterior leaflet and posteriorly directed   jet of mitral regurgitation. Mitral regurgitation is at least   moderate, most probably severe. 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   A TEE is recommended for further evaluation, surgical consult   should be considered.  ------------------------------------------------------------------- Study data:   Study status:  Routine.  Procedure:  The patient reported no pain pre or post test. Transthoracic echocardiography for left ventricular function evaluation, for right ventricular function evaluation, and for assessment of valvular function. Image quality was adequate.  Study completion:  There were no complications.          Transthoracic echocardiography.  M-mode, complete 2D, spectral Doppler, and color Doppler.  Birthdate: Patient birthdate: [birth year].  Age:  Patient is 77 yr old.  Sex: Gender: female.    BMI: 29.8 kg/m^2.  Patient status:  Outpatient. Study date:  Study date: 06/12/2017. Study time: 01:12 PM. Location:  [facility]  -------------------------------------------------------------------  ------------------------------------------------------------------- Left ventricle:  The cavity size was normal. There was mild concentric hypertrophy. Systolic function was mildly reduced. The estimated  ejection fraction was in the range of 45% to 50%. Wall motion was normal; there were no regional wall motion abnormalities. The study was not technically sufficient to allow evaluation of LV diastolic dysfunction due to atrial fibrillation.   ------------------------------------------------------------------- Aortic valve:   Trileaflet; normal thickness leaflets. Mobility was not restricted.  Doppler:  Transvalvular velocity was within the normal range. There was no stenosis. There was no regurgitation.   ------------------------------------------------------------------- Aorta:  Aortic root: The aortic root was normal in size.  ------------------------------------------------------------------- Mitral valve:  Thickened mitral valve with prolapse of the anterior leaflet and posteriorly directed jet of mitral regurgitation. Mobility was not restricted.  Doppler:  Transvalvular velocity was within the normal range. There was no evidence for stenosis. There was mild regurgitation directed posteriorly.    Peak gradient (D): 4 mm Hg.  ------------------------------------------------------------------- Left atrium:  The atrium was severely dilated.  ------------------------------------------------------------------- Right ventricle:  The cavity size was normal. Wall thickness was normal. Systolic function was normal.  ------------------------------------------------------------------- Pulmonic valve:    Structurally normal valve.   Cusp separation was normal.  Doppler:  Transvalvular velocity was within the normal range. There was no evidence for stenosis. There was no regurgitation.  ------------------------------------------------------------------- Tricuspid valve:   Structurally normal valve.    Doppler: Transvalvular velocity was within the normal range. There was mild regurgitation.  ------------------------------------------------------------------- Pulmonary artery:    The main pulmonary artery was normal-sized. Systolic pressure was moderately increased.  ------------------------------------------------------------------- Right atrium:  The atrium was moderately dilated.  ------------------------------------------------------------------- Pericardium:  There was no pericardial effusion.  ------------------------------------------------------------------- Systemic veins: Inferior vena cava: The vessel was normal in size.  ------------------------------------------------------------------- Measurements   Left ventricle  Value        Reference  LV ID, ED, PLAX chordal        (H)     52.7  mm     43 - 52  LV ID, ES, PLAX chordal                33.3  mm     23 - 38  LV fx shortening, PLAX chordal         37    %      >=29  LV PW thickness, ED                    12.9  mm     ---------  IVS/LV PW ratio, ED                    0.7          <=1.3  Stroke volume, 2D                      74    ml     ---------  Stroke volume/bsa, 2D                  41    ml/m^2 ---------  LV e&', lateral                         16.4  cm/s   ---------  LV E/e&', lateral                       6.1          ---------  LV e&', medial                          9.76  cm/s   ---------  LV E/e&', medial                        10.25        ---------  LV e&', average                         13.08 cm/s   ---------  LV E/e&', average                       7.65         ---------    Ventricular septum                     Value        Reference  IVS thickness, ED                      9.09  mm     ---------    LVOT                                   Value        Reference  LVOT ID, S                             22    mm     ---------  LVOT area  3.8   cm^2   ---------  LVOT ID                                22    mm     ---------  LVOT peak velocity, S                  79.4  cm/s   ---------  LVOT mean velocity, S                   52.7  cm/s   ---------  LVOT VTI, S                            19.4  cm     ---------  LVOT peak gradient, S                  3     mm Hg  ---------  Stroke volume (SV), LVOT DP            73.7  ml     ---------  Stroke index (SV/bsa), LVOT DP         40.4  ml/m^2 ---------    Aorta                                  Value        Reference  Aortic root ID, ED                     33    mm     ---------    Left atrium                            Value        Reference  LA ID, A-P, ES                         52    mm     ---------  LA ID/bsa, A-P                 (H)     2.85  cm/m^2 <=2.2  LA volume, S                           99    ml     ---------  LA volume/bsa, S                       54.3  ml/m^2 ---------  LA volume, ES, 1-p A4C                 104   ml     ---------  LA volume/bsa, ES, 1-p A4C             57    ml/m^2 ---------  LA volume, ES, 1-p A2C                 90    ml     ---------  LA volume/bsa, ES, 1-p A2C             49.3  ml/m^2 ---------    Mitral valve  Value        Reference  Mitral E-wave peak velocity            100   cm/s   ---------  Mitral peak gradient, D                4     mm Hg  ---------    Pulmonary arteries                     Value        Reference  PA pressure, S, DP             (H)     50    mm Hg  <=30    Tricuspid valve                        Value        Reference  Tricuspid regurg peak velocity         342   cm/s   ---------  Tricuspid peak RV-RA gradient          47    mm Hg  ---------    Right ventricle                        Value        Reference  RV s&', lateral, S                      15.36 cm/s   ---------  Legend: (L)  and  (H)  mark values outside specified reference range.  ------------------------------------------------------------------- Prepared and Electronically Authenticated by  Ena Dawley, M.D. 2018-11-02T14:35:11   Transesophageal Echocardiography  Patient:    Brooke Chavez, Brooke Chavez MR #:        [id number] Study Date: 09/18/2017 Gender:     F Age:        89 Height:     160 cm Weight:     72.6 kg BSA:        1.82 m^2 Pt. Status: Room:   SONOGRAPHER  [facility]  ADMITTING    Skeet Latch, MD  ATTENDING    Skeet Latch, MD  ORDERING     Skeet Latch, MD  PERFORMING   Skeet Latch, MD  [place], MD  cc:  ------------------------------------------------------------------- LV EF: 45% -   50%  ------------------------------------------------------------------- Indications:      Mitral valve prolapse 424.0.  ------------------------------------------------------------------- History:   PMH:   Atrial flutter.  Mitral valve disease.  Risk factors:  Hypertension. Dyslipidemia.  ------------------------------------------------------------------- Study Conclusions  - Left ventricle: Systolic function was mildly reduced. The   estimated ejection fraction was in the range of 45% to 50%. Wall   motion was normal; there were no regional wall motion   abnormalities. - Aortic valve: There was trivial regurgitation. - Mitral valve: Elongation, consistent with myxomatous   proliferation. Moderate prolapse, involving the middle segment of   the anterior leaflet and the medial scallop of the posterior   leaflet. There was severe regurgitation directed eccentrically   and posteriorly. Severe regurgitation is suggested by pulmonary   vein systolic flow reversal. - Left atrium: The atrium was severely dilated. No evidence of   thrombus in the atrial cavity or appendage. No evidence of   thrombus in the atrial cavity or appendage. - Right ventricle: Systolic function was normal. - Right atrium: The  atrium was severely dilated. No evidence of   thrombus in the atrial cavity or appendage. - Atrial septum: There was a patent foramen ovale with left to   right flow at rest. - Tricuspid valve: There was mild regurgitation. Peak RV-RA    gradient (S): 41 mm Hg.  ------------------------------------------------------------------- Study data:   Study status:  Routine.  Consent:  The risks, benefits, and alternatives to the procedure were explained to the patient and informed consent was obtained.  Procedure:  The patient reported no pain pre or post test. Initial setup. The patient was brought to the laboratory. Surface ECG leads were monitored. Sedation. Conscious sedation was administered by cardiology staff. Transesophageal echocardiography. Topical anesthesia was obtained using viscous lidocaine. An adult multiplane transesophageal probe was inserted by the attending cardiologistwithout difficulty. Image quality was adequate.  Study completion:  The patient tolerated the procedure well. There were no complications.  Administered medications:   Fentanyl, 62.1mcg, IV.  Midazolam, 4mg , IV. Diagnostic transesophageal echocardiography.  2D and color Doppler.  Birthdate:  Patient birthdate: 21-Apr-1941.  Age:  Patient is 77 yr old.  Sex:  Gender: female.    BMI: 28.4 kg/m^2.  Blood pressure:   171/67  Patient status:  Outpatient.  Study date:  Study date: 09/18/2017. Study time: 11:44 AM.  Location:  Endoscopy.  -------------------------------------------------------------------  ------------------------------------------------------------------- Left ventricle:  Systolic function was mildly reduced. The estimated ejection fraction was in the range of 45% to 50%. Wall motion was normal; there were no regional wall motion abnormalities.  ------------------------------------------------------------------- Aortic valve:   Structurally normal valve. Trileaflet; normal thickness leaflets. Cusp separation was normal.  Doppler:  There was trivial regurgitation.  ------------------------------------------------------------------- Aorta:  There was no atheroma. There was no evidence for dissection. Aortic root: The aortic  root was not dilated. Ascending aorta: The ascending aorta was normal in size. Aortic arch: The aortic arch was normal in size. Descending aorta: The descending aorta was normal in size.  ------------------------------------------------------------------- Mitral valve:   Elongation, consistent with myxomatous proliferation. Leaflet separation was normal.  Moderate prolapse, involving the middle segment of the anterior leaflet and the medial scallop of the posterior leaflet.  Doppler:  There was severe regurgitation directed eccentrically and posteriorly. Severe regurgitation is suggested by pulmonary vein systolic flow reversal.  ------------------------------------------------------------------- Left atrium:  The atrium was severely dilated.  No evidence of thrombus in the atrial cavity or appendage.  No evidence of thrombus in the atrial cavity or appendage. The appendage was morphologically a left appendage, multilobulated, and of normal size. Emptying velocity was normal.  ------------------------------------------------------------------- Atrial septum:  There was a patent foramen ovale with left to right flow at rest.  ------------------------------------------------------------------- Right ventricle:  The cavity size was normal. Wall thickness was normal. Systolic function was normal.  ------------------------------------------------------------------- Pulmonic valve:    Structurally normal valve.  ------------------------------------------------------------------- Tricuspid valve:   Structurally normal valve.   Leaflet separation was normal.  Doppler:  There was mild regurgitation.  ------------------------------------------------------------------- Pulmonary artery:   The main pulmonary artery was normal-sized.  ------------------------------------------------------------------- Right atrium:  The atrium was severely dilated.  No evidence of thrombus in the  atrial cavity or appendage. The appendage was morphologically a right appendage.  ------------------------------------------------------------------- Pericardium:  There was no pericardial effusion.  ------------------------------------------------------------------- Measurements   Mitral valve                                  Value  Mitral regurg  VTI, PISA                       207    cm  Mitral ERO, PISA                              0.3    cm^2  Mitral regurg volume, PISA                    62     ml    Tricuspid valve                               Value  Tricuspid regurg peak velocity                318.45 cm/s  Tricuspid peak RV-RA gradient                 41     mm Hg  Tricuspid maximal regurg velocity, PISA       318.45 cm/s  Legend: (L)  and  (H)  mark values outside specified reference range.  ------------------------------------------------------------------- Prepared and Electronically Authenticated by  Skeet Latch, MD 2019-02-08T17:17:53   Impression:  Patient has mitral valve prolapse with stage D severe symptomatic primary mitral regurgitation.  She describes stable symptoms of exertional shortness of breath that occur only with more strenuous physical exertion such as walking up a flight of stairs or walking up an incline, consistent with chronic diastolic congestive heart failure New York Heart Association functional class II.  I have personally reviewed the patient's recent transthoracic and transesophageal echocardiograms.  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  There are no obvious ruptured primary chordae tendinae but there is severe elongation and prolapse involving a portion of the middle scallop of the anterior leaflet.  There is an eccentric jet of regurgitation that courses posteriorly around the dilated left atrium.  There was flow reversal in the pulmonary veins.  There is  moderate left ventricular hypertrophy and mild left ventricular systolic dysfunction with ejection fraction estimated only 45-50% in the setting of severe mitral regurgitation.  I agree the patient needs elective mitral valve repair.   Plan:  The patient and one of her daughters were counseled at length regarding the indications, risks and potential benefits of mitral valve repair.  The rationale for elective surgery has been explained, including a comparison between surgery and continued medical therapy with close follow-up.  Long-term prognosis with continued medical therapy was discussed.  Expectations for her postoperative convalescence were discussed.  Alternative surgical approaches were discussed including a comparison between conventional median sternotomy and minimally invasive approach.  The patient is interested in proceeding with surgery in the near future.  As a next step the patient will need to undergo left and right heart catheterization.  In the absence of significant coronary artery disease she might be an acceptable candidate for minimally invasive approach for surgery.  As such she will undergo CT angiography to evaluate the feasibility of peripheral arterial cannulation for surgery.  We tentatively plan to proceed with minimally invasive mitral valve repair on November 24, 2017.  The patient will return to our office for follow-up prior to surgery on November 16, 2017.  At that time we will discuss timing of when she should stop taking Eliquis  in anticipation of surgery.    I spent in excess of 90 minutes during the conduct of this office consultation and >50% of this time involved direct face-to-face encounter with the patient for counseling and/or coordination of their care.    Valentina Gu. Roxy Manns, MD 11/02/2017 3:22 PM

## 2017-11-02 NOTE — Telephone Encounter (Signed)
Form signed and faxed to [facility] today

## 2017-11-03 ENCOUNTER — Other Ambulatory Visit: Payer: Self-pay | Admitting: *Deleted

## 2017-11-03 ENCOUNTER — Telehealth: Payer: Self-pay

## 2017-11-03 ENCOUNTER — Ambulatory Visit (INDEPENDENT_AMBULATORY_CARE_PROVIDER_SITE_OTHER): Payer: Medicare Other | Admitting: Nurse Practitioner

## 2017-11-03 ENCOUNTER — Encounter: Payer: Self-pay | Admitting: Nurse Practitioner

## 2017-11-03 VITALS — BP 134/66 | HR 47 | Temp 98.2°F | Ht 63.0 in | Wt 164.2 lb

## 2017-11-03 DIAGNOSIS — M810 Age-related osteoporosis without current pathological fracture: Secondary | ICD-10-CM

## 2017-11-03 DIAGNOSIS — I10 Essential (primary) hypertension: Secondary | ICD-10-CM | POA: Diagnosis not present

## 2017-11-03 DIAGNOSIS — Z6830 Body mass index (BMI) 30.0-30.9, adult: Secondary | ICD-10-CM

## 2017-11-03 DIAGNOSIS — E785 Hyperlipidemia, unspecified: Secondary | ICD-10-CM

## 2017-11-03 DIAGNOSIS — I483 Typical atrial flutter: Secondary | ICD-10-CM | POA: Diagnosis not present

## 2017-11-03 DIAGNOSIS — R001 Bradycardia, unspecified: Secondary | ICD-10-CM | POA: Diagnosis not present

## 2017-11-03 DIAGNOSIS — K219 Gastro-esophageal reflux disease without esophagitis: Secondary | ICD-10-CM

## 2017-11-03 DIAGNOSIS — I340 Nonrheumatic mitral (valve) insufficiency: Secondary | ICD-10-CM

## 2017-11-03 MED ORDER — HYDROCHLOROTHIAZIDE 25 MG PO TABS: 25.0000 mg | ORAL_TABLET | Freq: Every day | ORAL | 1 refills | Status: DC

## 2017-11-03 MED ORDER — APIXABAN 5 MG PO TABS: 5.0000 mg | ORAL_TABLET | Freq: Two times a day (BID) | ORAL | 1 refills | Status: DC

## 2017-11-03 MED ORDER — AMLODIPINE BESYLATE 10 MG PO TABS: 10.0000 mg | ORAL_TABLET | Freq: Every day | ORAL | 1 refills | Status: DC

## 2017-11-03 MED ORDER — SPIRONOLACTONE 25 MG PO TABS: 25.0000 mg | ORAL_TABLET | Freq: Every evening | ORAL | 1 refills | Status: DC | PRN

## 2017-11-03 MED ORDER — ATORVASTATIN CALCIUM 40 MG PO TABS: 40.0000 mg | ORAL_TABLET | Freq: Every day | ORAL | 1 refills | Status: DC

## 2017-11-03 NOTE — Addendum Note (Signed)
Addended by: [name] on: 11/03/2017 03:14 PM   Modules accepted: Orders

## 2017-11-03 NOTE — Progress Notes (Signed)
Subjective:    Patient ID: Brooke Chavez, female    DOB: April 13, 1941, 77 y.o.   MRN: [id number]  HPI  Brooke Chavez is here today for follow up of chronic medical problem.  Outpatient Encounter Medications as of 11/03/2017  Medication Sig  . amLODipine (NORVASC) 10 MG tablet TAKE 1 TABLET BY MOUTH ONCE DAILY (NEEDS TO BE SEEN FOR REFILLS) (Patient taking differently: Take 10 mg by mouth at bedtime. (NEEDS TO BE SEEN FOR REFILLS))  . apixaban (ELIQUIS) 5 MG TABS tablet Take 1 tablet (5 mg total) by mouth 2 (two) times daily.  Marland Kitchen atorvastatin (LIPITOR) 40 MG tablet Take 1 tablet (40 mg total) by mouth daily. (Patient taking differently: Take 40 mg by mouth at bedtime. )  . hydrochlorothiazide (HYDRODIURIL) 25 MG tablet Take 1 tablet (25 mg total) daily by mouth. (Patient taking differently: Take 25 mg by mouth at bedtime. )  . spironolactone (ALDACTONE) 25 MG tablet TAKE 1 TABLET BY MOUTH ONCE DAILY (Patient taking differently: Take 25 mg by mouth at bedtime as needed. )  . IRON PO Take 1 tablet by mouth as needed (If iron is low before donating blood will take tablets).      1. Typical atrial flutter [facility])  She sees dr. Warren Lacy. She has leakage and she is fixing to have heart surgery April 16.  2. Essential hypertension  No c/o chest pain, sob or headche. Sh edoe snot check blood pressure at home  3. Age-related osteoporosis without current pathological fracture  Last dexascan was 07/13/15 with t score of -1.5. Patient does not want to do anymore dexascans  4. BMI 30.0-30.9,adult  No recent weight changes  5. Bradycardia  Is seeing Dr. Warren Lacy  6. Hyperlipidemia with target LDL less than 100  Has been watching diet since she found out that she has to do heart surgery  7. Gastroesophageal reflux disease without esophagitis  Uses pepcid when she needs it.    New complaints: None today  Social history: Her daughter and grandson llive with her    Review of Systems    Constitutional: Negative for activity change and appetite change.  HENT: Negative.   Eyes: Negative for pain.  Respiratory: Negative for shortness of breath.   Cardiovascular: Negative for chest pain, palpitations and leg swelling.  Gastrointestinal: Negative for abdominal pain.  Endocrine: Negative for polydipsia.  Genitourinary: Negative.   Skin: Negative for rash.  Neurological: Negative for dizziness, weakness and headaches.  Hematological: Does not bruise/bleed easily.  Psychiatric/Behavioral: Negative.   All other systems reviewed and are negative.      Objective:   Physical Exam  Constitutional: She is oriented to person, place, and time. She appears well-developed and well-nourished.  HENT:  Nose: Nose normal.  Mouth/Throat: Oropharynx is clear and moist.  Eyes: EOM are normal.  Neck: Trachea normal, normal range of motion and full passive range of motion without pain. Neck supple. No JVD present. Carotid bruit is not present. No thyromegaly present.  Cardiovascular: Normal rate, normal heart sounds and intact distal pulses. Exam reveals no gallop and no friction rub.  No murmur heard. irregular heart beat  Pulmonary/Chest: Effort normal and breath sounds normal.  Abdominal: Soft. Bowel sounds are normal. She exhibits no distension and no mass. There is no tenderness.  Musculoskeletal: Normal range of motion.  Lymphadenopathy:    She has no cervical adenopathy.  Neurological: She is alert and oriented to person, place, and time. She has normal reflexes.  Skin: Skin is warm and dry.  Psychiatric: She has a normal mood and affect. Her behavior is normal. Judgment and thought content normal.   BP 134/66   Pulse (!) 47   Temp 98.2 F (36.8 C) (Oral)   Ht 5' 3"  (1.6 m)   Wt 164 lb 3.2 oz (74.5 kg)   LMP 11/05/1992   BMI 29.09 kg/m       Assessment & Plan:  1. Typical atrial flutter ([place]) Keep follow up with rd. [place] - apixaban (ELIQUIS) 5 MG TABS tablet;  Take 1 tablet (5 mg total) by mouth 2 (two) times daily.  Dispense: 180 tablet; Refill: 1  2. Essential hypertension Low sodium diet - spironolactone (ALDACTONE) 25 MG tablet; Take 1 tablet (25 mg total) by mouth at bedtime as needed.  Dispense: 90 tablet; Refill: 1 - amLODipine (NORVASC) 10 MG tablet; Take 1 tablet (10 mg total) by mouth at bedtime. (NEEDS TO BE SEEN FOR REFILLS)  Dispense: 90 tablet; Refill: 1 - hydrochlorothiazide (HYDRODIURIL) 25 MG tablet; Take 1 tablet (25 mg total) by mouth daily.  Dispense: 90 tablet; Refill: 1 - CMP14+EGFR  3. Age-related osteoporosis without current pathological fracture Weight bearing exercise  4. BMI 30.0-30.9,adult Discussed diet and exercise for person with BMI >25 Will recheck weight in 3-6 months  5. Bradycardia  6. Hyperlipidemia with target LDL less than 100 Low fat diet - atorvastatin (LIPITOR) 40 MG tablet; Take 1 tablet (40 mg total) by mouth at bedtime.  Dispense: 90 tablet; Refill: 1 - Lipid panel  7. Gastroesophageal reflux disease without esophagitis Avoid spicy foods Do not eat 2 hours prior to bedtime     Labs pending Health maintenance reviewed Diet and exercise encouraged Continue all meds Follow up  In 3 months   Mayn[place], FNP

## 2017-11-03 NOTE — Telephone Encounter (Signed)
Scheduled patient for R/L heart cath on 4/3 with Dr. Burt Knack. Case time: 1330. Called to scheduled lab work and review cath instructions with patient.  Left message to call back.

## 2017-11-03 NOTE — Telephone Encounter (Signed)
-----   Message from Sherren Mocha, MD sent at 11/02/2017 10:12 PM EDT ----- Sure  Ander Purpura or Valetta Fuller can you arrange? thx ----- Message ----- From: Rexene Alberts, MD Sent: 11/02/2017   5:52 PM To: Barkley Boards, RN, Sherren Mocha, MD  This is a patient of Jake's with severe MR who needs mitral valve repair.  Can you get her in reasonably soon for L+R heart cath?  She is on Eliquis which will need to be held prior to cath.  No reason to bridge.  Thx Cub

## 2017-11-04 LAB — CMP14+EGFR
ALT: 8 IU/L (ref 0–32)
AST: 20 IU/L (ref 0–40)
Albumin/Globulin Ratio: 1.5 (ref 1.2–2.2)
Albumin: 4.3 g/dL (ref 3.5–4.8)
Alkaline Phosphatase: 75 IU/L (ref 39–117)
BUN/Creatinine Ratio: 15 (ref 12–28)
BUN: 23 mg/dL (ref 8–27)
Bilirubin Total: 0.5 mg/dL (ref 0.0–1.2)
CO2: 24 mmol/L (ref 20–29)
Calcium: 9.7 mg/dL (ref 8.7–10.3)
Chloride: 103 mmol/L (ref 96–106)
Creatinine, Ser: 1.54 mg/dL — ABNORMAL HIGH (ref 0.57–1.00)
GFR calc Af Amer: 37 mL/min/{1.73_m2} — ABNORMAL LOW (ref >59)
GFR calc non Af Amer: 32 mL/min/{1.73_m2} — ABNORMAL LOW (ref >59)
Globulin, Total: 2.8 g/dL (ref 1.5–4.5)
Glucose: 78 mg/dL (ref 65–99)
Potassium: 4.3 mmol/L (ref 3.5–5.2)
Sodium: 142 mmol/L (ref 134–144)
Total Protein: 7.1 g/dL (ref 6.0–8.5)

## 2017-11-04 LAB — LIPID PANEL
Chol/HDL Ratio: 3.2 ratio (ref 0.0–4.4)
Cholesterol, Total: 200 mg/dL — ABNORMAL HIGH (ref 100–199)
HDL: 63 mg/dL (ref >39)
LDL Calculated: 106 mg/dL — ABNORMAL HIGH (ref 0–99)
Triglycerides: 155 mg/dL — ABNORMAL HIGH (ref 0–149)
VLDL Cholesterol Cal: 31 mg/dL (ref 5–40)

## 2017-11-04 NOTE — Telephone Encounter (Signed)
Left message to call back  

## 2017-11-04 NOTE — Telephone Encounter (Signed)
Follow up  ° ° °Patient is returning call. Please call to discuss.  °

## 2017-11-05 ENCOUNTER — Encounter: Payer: Self-pay | Admitting: *Deleted

## 2017-11-05 NOTE — Telephone Encounter (Signed)
Left pt a message to call back. 

## 2017-11-05 NOTE — Telephone Encounter (Signed)
New message  Pt verbalized that she is returning call for RN  She is calling to schedule her procedure

## 2017-11-05 NOTE — Telephone Encounter (Signed)
F/U Call:  Patient returning call , states that her cell phone is not in service. Please call her at this number [phone].

## 2017-11-06 NOTE — Telephone Encounter (Signed)
Confirmed with patient date/time of R/L heart cath 4/3 with Dr. Burt Knack. Instructions reviewed and letter made and placed at front desk for patient to pick up on Monday. She understands her last dose of Eliquis should be taken Monday night. She will call if she has any questions.

## 2017-11-10 ENCOUNTER — Telehealth: Payer: Self-pay | Admitting: *Deleted

## 2017-11-10 NOTE — Telephone Encounter (Addendum)
Pt contacted pre-catheterization scheduled at [facility] for: Wednesday November 11, 2017 1:30 PM Verified arrival time and place: [place] Entrance A/[place] at: 8:30 AM for IV hydration pre-cath Nothing to eat or drink after midnight prior to cath. Verified allergies in Epic. Verified no diabetes medications.  Hold: Apixaban 11/09/17, 11/10/17, 11/11/17 until post cath. HCTZ AM of cath Spironolactone AM of cath  Except hold medications AM meds can be  taken pre-cath with sip of water including: ASA 81 mg  Confirmed patient has responsible person to drive home post procedure and observe patient for 24 hours: yes

## 2017-11-11 ENCOUNTER — Encounter (HOSPITAL_COMMUNITY)
Admission: RE | Disposition: A | Discharge status: Home / Self Care | Payer: Self-pay | Source: Ambulatory Visit | Attending: Cardiovascular Disease

## 2017-11-11 ENCOUNTER — Ambulatory Visit (HOSPITAL_COMMUNITY)
Admission: RE | Admit: 2017-11-11 | Discharge: 2017-11-11 | Disposition: A | Discharge status: Home / Self Care | Payer: Medicare Other | Source: Ambulatory Visit | Attending: Cardiovascular Disease | Admitting: Cardiovascular Disease

## 2017-11-11 DIAGNOSIS — M1990 Unspecified osteoarthritis, unspecified site: Secondary | ICD-10-CM | POA: Insufficient documentation

## 2017-11-11 DIAGNOSIS — Z96652 Presence of left artificial knee joint: Secondary | ICD-10-CM | POA: Insufficient documentation

## 2017-11-11 DIAGNOSIS — Z886 Allergy status to analgesic agent status: Secondary | ICD-10-CM | POA: Diagnosis not present

## 2017-11-11 DIAGNOSIS — Z79899 Other long term (current) drug therapy: Secondary | ICD-10-CM | POA: Insufficient documentation

## 2017-11-11 DIAGNOSIS — Z885 Allergy status to narcotic agent status: Secondary | ICD-10-CM | POA: Insufficient documentation

## 2017-11-11 DIAGNOSIS — I10 Essential (primary) hypertension: Secondary | ICD-10-CM | POA: Insufficient documentation

## 2017-11-11 DIAGNOSIS — Z7901 Long term (current) use of anticoagulants: Secondary | ICD-10-CM | POA: Insufficient documentation

## 2017-11-11 DIAGNOSIS — I340 Nonrheumatic mitral (valve) insufficiency: Secondary | ICD-10-CM | POA: Diagnosis present

## 2017-11-11 DIAGNOSIS — I080 Rheumatic disorders of both mitral and aortic valves: Secondary | ICD-10-CM | POA: Diagnosis present

## 2017-11-11 DIAGNOSIS — I081 Rheumatic disorders of both mitral and tricuspid valves: Secondary | ICD-10-CM | POA: Insufficient documentation

## 2017-11-11 DIAGNOSIS — E785 Hyperlipidemia, unspecified: Secondary | ICD-10-CM | POA: Insufficient documentation

## 2017-11-11 HISTORY — PX: RIGHT/LEFT HEART CATH AND CORONARY ANGIOGRAPHY: CATH118266

## 2017-11-11 LAB — POCT I-STAT 3, VENOUS BLOOD GAS (G3P V)
Acid-base deficit: 2 mmol/L (ref 0.0–2.0)
Bicarbonate: 22.6 mmol/L (ref 20.0–28.0)
O2 Saturation: 70 %
TCO2: 24 mmol/L (ref 22–32)
pCO2, Ven: 39.4 mmHg — ABNORMAL LOW (ref 44.0–60.0)
pH, Ven: 7.367 (ref 7.250–7.430)
pO2, Ven: 38 mmHg (ref 32.0–45.0)

## 2017-11-11 LAB — CBC WITH DIFFERENTIAL/PLATELET
Basophils Absolute: 0 10*3/uL (ref 0.0–0.1)
Basophils Relative: 0 %
Eosinophils Absolute: 0.1 10*3/uL (ref 0.0–0.7)
Eosinophils Relative: 1 %
HCT: 40.4 % (ref 36.0–46.0)
Hemoglobin: 13.4 g/dL (ref 12.0–15.0)
Lymphocytes Relative: 24 %
Lymphs Abs: 1.7 10*3/uL (ref 0.7–4.0)
MCH: 31.5 pg (ref 26.0–34.0)
MCHC: 33.2 g/dL (ref 30.0–36.0)
MCV: 94.8 fL (ref 78.0–100.0)
Monocytes Absolute: 0.3 10*3/uL (ref 0.1–1.0)
Monocytes Relative: 4 %
Neutro Abs: 5 10*3/uL (ref 1.7–7.7)
Neutrophils Relative %: 71 %
Platelets: 254 10*3/uL (ref 150–400)
RBC: 4.26 MIL/uL (ref 3.87–5.11)
RDW: 13.5 % (ref 11.5–15.5)
WBC: 7 10*3/uL (ref 4.0–10.5)

## 2017-11-11 LAB — POCT ACTIVATED CLOTTING TIME: Activated Clotting Time: 175 seconds

## 2017-11-11 LAB — POCT I-STAT 3, ART BLOOD GAS (G3+)
Acid-base deficit: 4 mmol/L — ABNORMAL HIGH (ref 0.0–2.0)
Bicarbonate: 20.8 mmol/L (ref 20.0–28.0)
O2 Saturation: 92 %
TCO2: 22 mmol/L (ref 22–32)
pCO2 arterial: 35.1 mmHg (ref 32.0–48.0)
pH, Arterial: 7.381 (ref 7.350–7.450)
pO2, Arterial: 64 mmHg — ABNORMAL LOW (ref 83.0–108.0)

## 2017-11-11 LAB — PROTIME-INR
INR: 0.98
Prothrombin Time: 12.9 seconds (ref 11.4–15.2)

## 2017-11-11 SURGERY — RIGHT/LEFT HEART CATH AND CORONARY ANGIOGRAPHY
Anesthesia: LOCAL

## 2017-11-11 MED ORDER — VERAPAMIL HCL 2.5 MG/ML IV SOLN
INTRAVENOUS | Status: DC | PRN
  Administered 2017-11-11: 8 mL via INTRA_ARTERIAL

## 2017-11-11 MED ORDER — SODIUM CHLORIDE 0.9% FLUSH: 3.0000 mL | INTRAVENOUS | Status: DC | PRN

## 2017-11-11 MED ORDER — SODIUM CHLORIDE 0.9% FLUSH: 3.0000 mL | Freq: Two times a day (BID) | INTRAVENOUS | Status: DC

## 2017-11-11 MED ORDER — LIDOCAINE HCL (PF) 1 % IJ SOLN
INTRAMUSCULAR | Status: DC | PRN
  Administered 2017-11-11: 2 mL
  Administered 2017-11-11: 10 mL

## 2017-11-11 MED ORDER — VERAPAMIL HCL 2.5 MG/ML IV SOLN
INTRAVENOUS | Status: AC
  Filled 2017-11-11: qty 2

## 2017-11-11 MED ORDER — MIDAZOLAM HCL 2 MG/2ML IJ SOLN
INTRAMUSCULAR | Status: AC
  Filled 2017-11-11: qty 2

## 2017-11-11 MED ORDER — ONDANSETRON HCL 4 MG/2ML IJ SOLN: 4.0000 mg | Freq: Four times a day (QID) | INTRAMUSCULAR | Status: DC | PRN

## 2017-11-11 MED ORDER — HEPARIN SODIUM (PORCINE) 1000 UNIT/ML IJ SOLN
INTRAMUSCULAR | Status: DC | PRN
  Administered 2017-11-11: 3000 [IU] via INTRAVENOUS

## 2017-11-11 MED ORDER — IOHEXOL 350 MG/ML SOLN
INTRAVENOUS | Status: DC | PRN
  Administered 2017-11-11: 60 mL via INTRA_ARTERIAL

## 2017-11-11 MED ORDER — SODIUM CHLORIDE 0.9 % WEIGHT BASED INFUSION: 1.0000 mL/kg/h | INTRAVENOUS | Status: DC

## 2017-11-11 MED ORDER — MIDAZOLAM HCL 2 MG/2ML IJ SOLN
INTRAMUSCULAR | Status: DC | PRN
  Administered 2017-11-11: 1 mg via INTRAVENOUS

## 2017-11-11 MED ORDER — FENTANYL CITRATE (PF) 100 MCG/2ML IJ SOLN
INTRAMUSCULAR | Status: AC
  Filled 2017-11-11: qty 2

## 2017-11-11 MED ORDER — ASPIRIN 81 MG PO CHEW: 81.0000 mg | CHEWABLE_TABLET | ORAL | Status: DC

## 2017-11-11 MED ORDER — FENTANYL CITRATE (PF) 100 MCG/2ML IJ SOLN
INTRAMUSCULAR | Status: DC | PRN
  Administered 2017-11-11: 25 ug via INTRAVENOUS

## 2017-11-11 MED ORDER — HEPARIN (PORCINE) IN NACL 2-0.9 UNIT/ML-% IJ SOLN
INTRAMUSCULAR | Status: AC | PRN
  Administered 2017-11-11: 500 mL

## 2017-11-11 MED ORDER — LIDOCAINE HCL (PF) 1 % IJ SOLN
INTRAMUSCULAR | Status: AC
  Filled 2017-11-11: qty 30

## 2017-11-11 MED ORDER — SODIUM CHLORIDE 0.9 % IV SOLN: 250.0000 mL | INTRAVENOUS | Status: DC | PRN

## 2017-11-11 MED ORDER — SODIUM CHLORIDE 0.9 % WEIGHT BASED INFUSION
3.0000 mL/kg/h | INTRAVENOUS | Status: AC
  Administered 2017-11-11: 3 mL/kg/h via INTRAVENOUS

## 2017-11-11 MED ORDER — HEPARIN SODIUM (PORCINE) 1000 UNIT/ML IJ SOLN
INTRAMUSCULAR | Status: AC
  Filled 2017-11-11: qty 1

## 2017-11-11 MED ORDER — HEPARIN (PORCINE) IN NACL 2-0.9 UNIT/ML-% IJ SOLN
INTRAMUSCULAR | Status: AC
Start: 2017-11-11 — End: ?
  Filled 2017-11-11: qty 1000

## 2017-11-11 SURGICAL SUPPLY — 20 items
BAND CMPR LRG ZPHR (HEMOSTASIS) ×1
BAND ZEPHYR COMPRESS 30 LONG (HEMOSTASIS) ×2 IMPLANT
CATH INFINITI 5 FR JL3.5 (CATHETERS) ×1 IMPLANT
CATH INFINITI JR4 5F (CATHETERS) ×1 IMPLANT
CATH LAUNCHER 5F JR4 (CATHETERS) ×2 IMPLANT
CATH SWAN GANZ 7F STRAIGHT (CATHETERS) ×2 IMPLANT
COVER PRB 48X5XTLSCP FOLD TPE (BAG) IMPLANT
COVER PROBE 5X48 (BAG) ×2
GUIDEWIRE INQWIRE 1.5J.035X260 (WIRE) IMPLANT
INQWIRE 1.5J .035X260CM (WIRE) ×2
KIT HEART LEFT (KITS) ×2 IMPLANT
KIT HEMO VALVE WATCHDOG (MISCELLANEOUS) ×1 IMPLANT
NDL PERC 21GX4CM (NEEDLE) IMPLANT
NEEDLE PERC 21GX4CM (NEEDLE) ×2 IMPLANT
PACK CARDIAC CATHETERIZATION (CUSTOM PROCEDURE TRAY) ×2 IMPLANT
SHEATH AVANTI 11CM 5FR (SHEATH) ×2 IMPLANT
SHEATH AVANTI 11CM 7FR (SHEATH) ×1 IMPLANT
SHEATH RAIN RADIAL 21G 6FR (SHEATH) ×1 IMPLANT
TRANSDUCER W/STOPCOCK (MISCELLANEOUS) ×2 IMPLANT
TUBING CIL FLEX 10 FLL-RA (TUBING) ×2 IMPLANT

## 2017-11-11 NOTE — Interval H&P Note (Signed)
History and Physical Interval Note:  11/11/2017 2:44 PM  Brooke Chavez  has presented today for surgery, with the diagnosis of mr  The various methods of treatment have been discussed with the patient and family. After consideration of risks, benefits and other options for treatment, the patient has consented to  Procedure(s): RIGHT/LEFT HEART CATH AND CORONARY ANGIOGRAPHY (N/A) as a surgical intervention .  The patient's history has been reviewed, patient examined, no change in status, stable for surgery.  I have reviewed the patient's chart and labs.  Questions were answered to the patient's satisfaction.     Sherren Mocha

## 2017-11-11 NOTE — Discharge Instructions (Signed)

## 2017-11-11 NOTE — Progress Notes (Signed)
Site area: rt groin fv sheath Site Prior to Removal:  Level  0 Pressure Applied For: 10 minutes Manual:   yes Patient Status During Pull:  stable Post Pull Site:  Level 0 Post Pull Instructions Given:  yes Post Pull Pulses Present: palpable rt dp Dressing Applied:  Gauze and tegaderm Bedrest begins @ 1600 Comments:

## 2017-11-12 ENCOUNTER — Encounter (HOSPITAL_COMMUNITY): Payer: Self-pay | Admitting: Cardiovascular Disease

## 2017-11-13 ENCOUNTER — Other Ambulatory Visit: Payer: Self-pay | Admitting: *Deleted

## 2017-11-13 ENCOUNTER — Encounter: Payer: Self-pay | Admitting: Thoracic Surgery (Cardiothoracic Vascular Surgery)

## 2017-11-13 ENCOUNTER — Ambulatory Visit
Admission: RE | Admit: 2017-11-13 | Discharge: 2017-11-13 | Disposition: A | Discharge status: Home / Self Care | Payer: Medicare Other | Source: Ambulatory Visit | Attending: Thoracic Surgery (Cardiothoracic Vascular Surgery) | Admitting: Thoracic Surgery (Cardiothoracic Vascular Surgery)

## 2017-11-13 ENCOUNTER — Telehealth: Payer: Self-pay | Admitting: Thoracic Surgery (Cardiothoracic Vascular Surgery)

## 2017-11-13 DIAGNOSIS — I340 Nonrheumatic mitral (valve) insufficiency: Secondary | ICD-10-CM

## 2017-11-13 DIAGNOSIS — K8689 Other specified diseases of pancreas: Secondary | ICD-10-CM

## 2017-11-13 DIAGNOSIS — I7409 Other arterial embolism and thrombosis of abdominal aorta: Secondary | ICD-10-CM

## 2017-11-13 DIAGNOSIS — K769 Liver disease, unspecified: Secondary | ICD-10-CM

## 2017-11-13 DIAGNOSIS — R160 Hepatomegaly, not elsewhere classified: Secondary | ICD-10-CM

## 2017-11-13 DIAGNOSIS — Z01818 Encounter for other preprocedural examination: Secondary | ICD-10-CM

## 2017-11-13 DIAGNOSIS — I712 Thoracic aortic aneurysm, without rupture, unspecified: Secondary | ICD-10-CM

## 2017-11-13 HISTORY — DX: Other specified diseases of pancreas: K86.89

## 2017-11-13 HISTORY — DX: Hepatomegaly, not elsewhere classified: R16.0

## 2017-11-13 MED ORDER — IOPAMIDOL (ISOVUE-370) INJECTION 76%
60.0000 mL | Freq: Once | INTRAVENOUS | Status: AC | PRN
  Administered 2017-11-13: 60 mL via INTRAVENOUS

## 2017-11-13 NOTE — Telephone Encounter (Signed)
Discussed results of CT angiogram with patient over the telephone and recommended that we proceed with MRI of the abdomen as soon as possible to investigate further.  All questions answered.  Rexene Alberts, MD 11/13/2017 1:30 PM

## 2017-11-16 ENCOUNTER — Other Ambulatory Visit: Payer: Self-pay | Admitting: *Deleted

## 2017-11-16 ENCOUNTER — Encounter: Payer: Self-pay | Admitting: Thoracic Surgery (Cardiothoracic Vascular Surgery)

## 2017-11-16 ENCOUNTER — Ambulatory Visit (INDEPENDENT_AMBULATORY_CARE_PROVIDER_SITE_OTHER): Payer: Medicare Other | Admitting: Thoracic Surgery (Cardiothoracic Vascular Surgery)

## 2017-11-16 VITALS — BP 140/60 | HR 84 | Resp 20 | Ht 63.0 in | Wt 164.0 lb

## 2017-11-16 DIAGNOSIS — I340 Nonrheumatic mitral (valve) insufficiency: Secondary | ICD-10-CM

## 2017-11-16 DIAGNOSIS — I712 Thoracic aortic aneurysm, without rupture, unspecified: Secondary | ICD-10-CM

## 2017-11-16 NOTE — Patient Instructions (Signed)
Stop taking Eliquis 7 days prior to surgery (take your last dose tomorrow)  Continue taking all other medications without change through the day before surgery.  Have nothing to eat or drink after midnight the night before surgery.  On the morning of surgery do not take any medications

## 2017-11-16 NOTE — Progress Notes (Signed)
[address]       [place] [address]             [phone]     CARDIOTHORACIC SURGERY OFFICE NOTE  Referring Provider is [name], MD PCP is [name], FNP   HPI:  Patient is a 77 year old African-American female who returns the office today for follow-up of severe symptomatic primary mitral regurgitation.  She was originally seen in consultation on November 02, 2017.  Since then she underwent left and right heart catheterization by Dr. Burt Knack on November 11, 2017.  She was found to have widely patent coronary arteries with no significant coronary artery disease.  Right heart pressures were minimally elevated.  There were large V waves on wedge tracing consistent with severe mitral regurgitation.  The patient subsequently underwent CT angiography and returns to our office today with tentative plans to proceed with minimally invasive mitral valve repair next week.  She reports no new problems or complaints.   Current Outpatient Medications  Medication Sig Dispense Refill  . acetaminophen (TYLENOL) 500 MG tablet Take 1,000 mg by mouth every 6 (six) hours as needed for moderate pain or headache.    Marland Kitchen amLODipine (NORVASC) 10 MG tablet Take 1 tablet (10 mg total) by mouth at bedtime. (NEEDS TO BE SEEN FOR REFILLS) 90 tablet 1  . apixaban (ELIQUIS) 5 MG TABS tablet Take 1 tablet (5 mg total) by mouth 2 (two) times daily. 180 tablet 1  . atorvastatin (LIPITOR) 40 MG tablet Take 1 tablet (40 mg total) by mouth at bedtime. 90 tablet 1  . hydrochlorothiazide (HYDRODIURIL) 25 MG tablet Take 1 tablet (25 mg total) by mouth daily. 90 tablet 1  . IRON PO Take 1 tablet by mouth daily as needed (If iron is low before donating blood will take tablets).     Marland Kitchen spironolactone (ALDACTONE) 25 MG tablet Take 1 tablet (25 mg total) by mouth at bedtime as needed. (Patient taking differently: Take 25 mg by mouth at bedtime. ) 90 tablet 1   No current facility-administered  medications for this visit.       Physical Exam:   BP 140/60   Pulse 84   Resp 20   Ht 5\' 3"  (1.6 m)   Wt 164 lb (74.4 kg)   LMP 11/05/1992   SpO2 97% Comment: RA  BMI 29.05 kg/m   General:  Well-appearing  Chest:   Clear to auscultation  CV:   Regular rate and rhythm with prominent systolic murmur  Incisions:  n/a  Abdomen:  Soft nontender  Extremities:  Warm and well perfused  Diagnostic Tests:  RIGHT/LEFT HEART CATH AND CORONARY ANGIOGRAPHY  Conclusion   1.  Widely patent coronary arteries without obstructive CAD 2.  Essentially normal right heart hemodynamics with minimal elevation of pulmonary artery pressure 3.  Prominent V wave and pulmonary capillary wedge tracing suggestive of hemodynamically significant mitral regurgitation    Indications   Severe mitral regurgitation [I34.0 (ICD-10-CM)]  Procedural Details/Technique   Technical Details INDICATION: Severe mitral regurgitation (preoperative study)  PROCEDURAL DETAILS: Ultrasound guidance is used to access the right femoral vein. The ultrasound images captured and stored in the patient's chart. A 7 French sheath is inserted. A Swan-Ganz catheter is used for the right heart procedure. Pressures were recorded and oxygen saturations are obtained. Fick cardiac output is calculated. Using the modified Seldinger technique a 5/6 French Slender sheath was placed in the right radial artery. Intra-arterial verapamil was  administered through the radial artery sheath. IV heparin was administered after a JR4 catheter was advanced into the central aorta. A Swan-Ganz catheter was used for the right heart catheterization. Standard protocol was followed for recording of right heart pressures and sampling of oxygen saturations. Fick cardiac output was calculated. Standard Judkins catheters were used for selective coronary angiography. Left ventricular pressure is recorded with a JR4 catheter. Because of subclavian tortuosity, a 5  [place] JR4 guide catheter was used to engage the right coronary artery. There were no immediate procedural complications. The patient was transferred to the post catheterization recovery area for further monitoring.    Estimated blood loss <50 mL.  During this procedure the patient was administered the following to achieve and maintain moderate conscious sedation: Versed 1 mg, Fentanyl 25 mcg, while the patient's heart rate, blood pressure, and oxygen saturation were continuously monitored. The period of conscious sedation was 31 minutes, of which I was present face-to-face 100% of this time.  Coronary Findings   Diagnostic  Dominance: Right  Left Main  The vessel exhibits minimal luminal irregularities.  Left Anterior Descending  The vessel exhibits minimal luminal irregularities.  Left Circumflex  The vessel exhibits minimal luminal irregularities.  Right Coronary Artery  The vessel exhibits minimal luminal irregularities.  Intervention   No interventions have been documented.  Coronary Diagrams   Diagnostic Diagram       Implants    No implant documentation for this case.  MERGE Images   Show images for CARDIAC CATHETERIZATION   Link to Procedure Log   Procedure Log    Hemo Data    Most Recent Value  Fick Cardiac Output 5.87 L/min  Fick Cardiac Output Index 3.32 (L/min)/BSA  Aortic Mean Gradient 5 mmHg  Aortic Peak Gradient 1 mmHg  Aortic Valve Area 3.10  Aortic Value Area Index 1.75 cm2/BSA  RA A Wave -99 mmHg  RA V Wave 0 mmHg  RA Mean 2 mmHg  RV Systolic Pressure 47 mmHg  RV Diastolic Pressure -3 mmHg  RV EDP 1 mmHg  PA Systolic Pressure 49 mmHg  PA Diastolic Pressure 11 mmHg  PA Mean 23 mmHg  PW A Wave -99 mmHg  PW V Wave 17 mmHg  PW Mean 9 mmHg  AO Systolic Pressure 956 mmHg  AO Diastolic Pressure 49 mmHg  AO Mean 82 mmHg  LV Systolic Pressure 387 mmHg  LV Diastolic Pressure 0 mmHg  LV EDP 5 mmHg  Arterial Occlusion Pressure Extended Systolic  Pressure 564 mmHg  Arterial Occlusion Pressure Extended Diastolic Pressure 62 mmHg  Arterial Occlusion Pressure Extended Mean Pressure 90 mmHg  Left Ventricular Apex Extended Systolic Pressure 332 mmHg  Left Ventricular Apex Extended Diastolic Pressure 0 mmHg  Left Ventricular Apex Extended EDP Pressure 2 mmHg  QP/QS 1  TPVR Index 6.94 HRUI  TSVR Index 26.82 HRUI  PVR SVR Ratio 0.16  TPVR/TSVR Ratio 0.26    CT ANGIOGRAPHY CHEST, ABDOMEN AND PELVIS  TECHNIQUE: Multidetector CT imaging through the chest, abdomen and pelvis was performed using the standard protocol during bolus administration of intravenous contrast. Multiplanar reconstructed images and MIPs were obtained and reviewed to evaluate the vascular anatomy.  CONTRAST:  64mL ISOVUE-370 IOPAMIDOL (ISOVUE-370) INJECTION 76% per Dr. Anselm Pancoast  COMPARISON:  06/09/2011 by report only  FINDINGS: CTA CHEST FINDINGS  Cardiovascular: Mild four-chamber cardiac enlargement. No pericardial effusion. Mildly dilated central pulmonary arteries. Satisfactory opacification of pulmonary arteries noted, and there is no evidence of pulmonary emboli. Scattered coronary calcifications. Adequate  contrast opacification of the thoracic aorta with no evidence of dissection, aneurysm, or stenosis. There is bovine variant brachiocephalic arch anatomy without proximal stenosis. Scattered calcified plaque in the arch and descending thoracic aorta.  Mediastinum/Nodes: No hilar or mediastinal adenopathy.  Lungs/Pleura: Lungs are clear. No pleural effusion or pneumothorax.  Musculoskeletal: No chest wall abnormality. No acute or significant osseous findings. Anterior vertebral endplate spurring at multiple levels in the mid and lower thoracic spine.  Review of the MIP images confirms the above findings.  CTA ABDOMEN AND PELVIS FINDINGS  VASCULAR  Aorta: No aneurysm, dissection, or stenosis. Scattered calcified plaque in the  infrarenal segment.  Celiac: Calcified ostial plaque resulting in mild short-segment stenosis, patent distally.  SMA: Patent without evidence of aneurysm, dissection, vasculitis or significant stenosis.  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  IMA: Diminutive, patent  Inflow: Scattered plaque in the common and internal iliac arteries without stenosis. Mild tortuosity. No aneurysm.  Veins: There is abnormal early opacification of the right femoral vein seen at the lower margin of the scan in the proximal thigh, suggesting high-flow lesion or AV fistula more peripherally in the right lower extremity. Inflow artifact in the right iliac venous system and IVC.  Review of the MIP images confirms the above findings.  NON-VASCULAR  Hepatobiliary: Multiple enhancing poorly marginated lesions in the liver measured up to 1.3 cm diameter (image 82/4), which are inconspicuous on delayed scans through the liver. 4.1 cm cyst in segment 4B. 2.1 cm cyst in segment 2. 1 cm probable subcapsular cyst in segment 8. Gallbladder is unremarkable. No biliary ductal dilatation.  Pancreas: 0.8 cm hypervascular lesion in the pancreatic body image 94/4. No ductal dilatation. No adjacent inflammatory changes.  Spleen: Normal in size without focal abnormality.  Adrenals/Urinary Tract: Bilateral adrenal hyperplasia. Multiple left renal cysts, largest 1.8 cm in the lower pole. No hydronephrosis. 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 Urinary bladder is nondistended  Stomach/Bowel: Stomach and small bowel are nondilated. Normal appendix. Colon is nondilated with a few scattered descending and sigmoid diverticula; no adjacent inflammatory/edematous change.  Lymphatic: No abdominal  or pelvic adenopathy.  Reproductive: Uterus and bilateral adnexa are unremarkable.  Other: No ascites.  No free air.  Musculoskeletal: Compression deformities of L2 and L3, seen on prior radiographs of 01/04/2013. Marked narrowing of the L4-5 interspace as before. Fixation hardware across the left femoral neck. No acute fracture or worrisome bone lesion.  Review of the MIP images confirms the above findings.  IMPRESSION: 1. Scattered aortoiliac arterial plaque without aneurysm or significant stenosis. There is mild tortuosity of the iliac arterial systems. 2. 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 3. Abnormal early enhancement of the right femoral vein suggesting high-flow lesion or AV fistula more peripherally in the right lower extremity, below the level of the scan. 4. Enhancing subcentimeter pancreatic lesion and multiple hypervascular liver lesions suggesting possibility of metastatic disease. Consider MR abdomen with and without contrast for further characterization.  These results will be called to the ordering clinician or representative by the Radiologist Assistant, and communication documented in the PACS or zVision Dashboard.   Electronically Signed   By: Lucrezia Europe M.D.   On: 11/13/2017 11:59    Impression:  Patient has mitral valve prolapse with stage D  severe symptomatic primary mitral regurgitation.  She describes stable symptoms of exertional shortness of breath that occur only with more strenuous physical exertion such as walking up a flight of stairs or walking up an incline, consistent with chronic diastolic congestive heart failure New York Heart Association functional class II.  I have personally reviewed the patient's recent transthoracic and transesophageal echocardiograms.  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  There are no  obvious ruptured primary chordae tendinae but there is severe elongation and prolapse involving a portion of the middle scallop of the anterior leaflet.  There is an eccentric jet of regurgitation that courses posteriorly around the dilated left atrium.  There was flow reversal in the pulmonary veins.  There is moderate left ventricular hypertrophy and mild left ventricular systolic dysfunction with ejection fraction estimated only 45-50% in the setting of severe mitral regurgitation.  I agree the patient needs elective mitral valve repair.  Diagnostic cardiac catheterization reveals normal coronary arteries with no significant coronary artery disease.  Right heart pressures were mildly elevated.  CT angiography revealed no contraindication to peripheral cannulation for surgery.  CT angiogram of the abdomen did reveal a small enhancing lesion in the pancreas and multiple hypervascular abnormalities of the liver.  MRI has been scheduled for later this week.    Plan:  The patient was again counseled at length regarding the indications, risks and potential benefits of mitral valve repair.  The rationale for elective surgery has been explained, including a comparison between surgery and continued medical therapy with close follow-up.  The likelihood of successful and durable valve repair has been discussed with particular reference to the findings of their recent echocardiogram.  Based upon these findings and previous experience, I have quoted them a greater than 90 percent likelihood of successful valve repair.  In the unlikely event that their valve cannot be successfully repaired, we discussed the possibility of replacing the mitral valve using a mechanical prosthesis with the attendant need for long-term anticoagulation versus the alternative of replacing it using a bioprosthetic tissue valve with its potential for late structural valve deterioration and failure, depending upon the patient's longevity.  The  patient specifically requests that if the mitral valve must be replaced that it be done using a bioprosthetic tissue valve.   The patient understands and accepts all potential risks of surgery including but not limited to risk of death, stroke or other neurologic complication, myocardial infarction, congestive heart failure, respiratory failure, renal failure, bleeding requiring transfusion and/or reexploration, arrhythmia, infection or other wound complications, pneumonia, pleural and/or pericardial effusion, pulmonary embolus, aortic dissection or other major vascular complication, or delayed complications related to valve repair or replacement including but not limited to structural valve deterioration and failure, thrombosis, embolization, endocarditis, or paravalvular leak.  Alternative surgical approaches have been discussed including a comparison between conventional sternotomy and minimally-invasive techniques.  The relative risks and benefits of each have been reviewed as they pertain to the patient's specific circumstances, and all of their questions have been addressed.  Specific risks potentially related to the minimally-invasive approach were discussed at length, including but not limited to risk of conversion to full or partial sternotomy, aortic dissection or other major vascular complication, unilateral acute lung injury or pulmonary edema, phrenic nerve dysfunction or paralysis, rib fracture, chronic pain, lung hernia, or lymphocele.   At the time of surgery we will plan to close the patient's patent foramen ovale.  We will  also plan to clip her left atrial appendage given her history of atrial flutter.  All of their questions have been answered.    We tentatively plan to proceed with surgery on November 24, 2017.  Patient has been instructed to stop taking Eliquis 7 days prior to surgery.    I spent in excess of 15 minutes during the conduct of this office consultation and >50% of this time  involved direct face-to-face encounter with the patient for counseling and/or coordination of their care.    Valentina Gu. Roxy Manns, MD 11/16/2017 1:04 PM

## 2017-11-17 ENCOUNTER — Ambulatory Visit: Payer: Medicare Other | Admitting: Thoracic Surgery (Cardiothoracic Vascular Surgery)

## 2017-11-20 ENCOUNTER — Ambulatory Visit (HOSPITAL_COMMUNITY)
Admission: RE | Admit: 2017-11-20 | Discharge: 2017-11-20 | Disposition: A | Discharge status: Home / Self Care | Payer: Medicare Other | Source: Ambulatory Visit | Attending: Thoracic Surgery (Cardiothoracic Vascular Surgery) | Admitting: Thoracic Surgery (Cardiothoracic Vascular Surgery)

## 2017-11-20 ENCOUNTER — Encounter (HOSPITAL_COMMUNITY): Payer: Self-pay

## 2017-11-20 ENCOUNTER — Encounter (HOSPITAL_COMMUNITY)
Admission: RE | Admit: 2017-11-20 | Discharge: 2017-11-20 | Disposition: A | Discharge status: Home / Self Care | Payer: Medicare Other | Source: Ambulatory Visit | Attending: Thoracic Surgery (Cardiothoracic Vascular Surgery) | Admitting: Thoracic Surgery (Cardiothoracic Vascular Surgery)

## 2017-11-20 ENCOUNTER — Other Ambulatory Visit: Payer: Self-pay

## 2017-11-20 DIAGNOSIS — Z01812 Encounter for preprocedural laboratory examination: Secondary | ICD-10-CM | POA: Diagnosis not present

## 2017-11-20 DIAGNOSIS — Z79899 Other long term (current) drug therapy: Secondary | ICD-10-CM | POA: Insufficient documentation

## 2017-11-20 DIAGNOSIS — I517 Cardiomegaly: Secondary | ICD-10-CM | POA: Diagnosis not present

## 2017-11-20 DIAGNOSIS — I340 Nonrheumatic mitral (valve) insufficiency: Secondary | ICD-10-CM

## 2017-11-20 DIAGNOSIS — Z01818 Encounter for other preprocedural examination: Secondary | ICD-10-CM | POA: Diagnosis not present

## 2017-11-20 DIAGNOSIS — R0602 Shortness of breath: Secondary | ICD-10-CM | POA: Insufficient documentation

## 2017-11-20 DIAGNOSIS — Z7901 Long term (current) use of anticoagulants: Secondary | ICD-10-CM | POA: Insufficient documentation

## 2017-11-20 DIAGNOSIS — Z0183 Encounter for blood typing: Secondary | ICD-10-CM | POA: Diagnosis not present

## 2017-11-20 DIAGNOSIS — I6523 Occlusion and stenosis of bilateral carotid arteries: Secondary | ICD-10-CM | POA: Insufficient documentation

## 2017-11-20 HISTORY — DX: Cardiac arrhythmia, unspecified: I49.9

## 2017-11-20 LAB — COMPREHENSIVE METABOLIC PANEL
ALT: 14 U/L (ref 14–54)
AST: 26 U/L (ref 15–41)
Albumin: 4 g/dL (ref 3.5–5.0)
Alkaline Phosphatase: 63 U/L (ref 38–126)
Anion gap: 13 (ref 5–15)
BUN: 17 mg/dL (ref 6–20)
CO2: 19 mmol/L — ABNORMAL LOW (ref 22–32)
Calcium: 9.2 mg/dL (ref 8.9–10.3)
Chloride: 106 mmol/L (ref 101–111)
Creatinine, Ser: 1.45 mg/dL — ABNORMAL HIGH (ref 0.44–1.00)
GFR calc Af Amer: 39 mL/min — ABNORMAL LOW (ref >60)
GFR calc non Af Amer: 34 mL/min — ABNORMAL LOW (ref >60)
Glucose, Bld: 95 mg/dL (ref 65–99)
Potassium: 3.8 mmol/L (ref 3.5–5.1)
Sodium: 138 mmol/L (ref 135–145)
Total Bilirubin: 0.8 mg/dL (ref 0.3–1.2)
Total Protein: 7.3 g/dL (ref 6.5–8.1)

## 2017-11-20 LAB — PULMONARY FUNCTION TEST
DL/VA % pred: 77 %
DL/VA: 3.6 ml/min/mmHg/L
DLCO unc % pred: 47 %
DLCO unc: 10.97 ml/min/mmHg
FEF 25-75 Post: 2.02 L/sec
FEF 25-75 Pre: 1.96 L/sec
FEF2575-%Change-Post: 3 %
FEF2575-%Pred-Post: 149 %
FEF2575-%Pred-Pre: 145 %
FEV1-%Change-Post: 0 %
FEV1-%Pred-Post: 103 %
FEV1-%Pred-Pre: 103 %
FEV1-Post: 1.61 L
FEV1-Pre: 1.61 L
FEV1FVC-%Change-Post: 3 %
FEV1FVC-%Pred-Pre: 110 %
FEV6-%Change-Post: -3 %
FEV6-%Pred-Post: 96 %
FEV6-%Pred-Pre: 99 %
FEV6-Post: 1.85 L
FEV6-Pre: 1.92 L
FEV6FVC-%Pred-Post: 104 %
FEV6FVC-%Pred-Pre: 104 %
FVC-%Change-Post: -3 %
FVC-%Pred-Post: 92 %
FVC-%Pred-Pre: 95 %
FVC-Post: 1.85 L
FVC-Pre: 1.92 L
Post FEV1/FVC ratio: 87 %
Post FEV6/FVC ratio: 100 %
Pre FEV1/FVC ratio: 84 %
Pre FEV6/FVC Ratio: 100 %
RV % pred: 64 %
RV: 1.46 L
TLC % pred: 75 %
TLC: 3.69 L

## 2017-11-20 LAB — CBC
HCT: 37.3 % (ref 36.0–46.0)
Hemoglobin: 12.5 g/dL (ref 12.0–15.0)
MCH: 31.6 pg (ref 26.0–34.0)
MCHC: 33.5 g/dL (ref 30.0–36.0)
MCV: 94.2 fL (ref 78.0–100.0)
Platelets: 252 10*3/uL (ref 150–400)
RBC: 3.96 MIL/uL (ref 3.87–5.11)
RDW: 13.6 % (ref 11.5–15.5)
WBC: 7.8 10*3/uL (ref 4.0–10.5)

## 2017-11-20 LAB — SURGICAL PCR SCREEN
MRSA, PCR: NEGATIVE
Staphylococcus aureus: NEGATIVE

## 2017-11-20 LAB — URINALYSIS, ROUTINE W REFLEX MICROSCOPIC
Bilirubin Urine: NEGATIVE
Glucose, UA: NEGATIVE mg/dL
Hgb urine dipstick: NEGATIVE
Ketones, ur: NEGATIVE mg/dL
Leukocytes, UA: NEGATIVE
Nitrite: NEGATIVE
Protein, ur: NEGATIVE mg/dL
Specific Gravity, Urine: 1.014 (ref 1.005–1.030)
pH: 6 (ref 5.0–8.0)

## 2017-11-20 LAB — TYPE AND SCREEN
ABO/RH(D): O NEG
Antibody Screen: NEGATIVE

## 2017-11-20 LAB — PROTIME-INR
INR: 1.03
Prothrombin Time: 13.4 seconds (ref 11.4–15.2)

## 2017-11-20 LAB — HEMOGLOBIN A1C
Hgb A1c MFr Bld: 5.5 % (ref 4.8–5.6)
Mean Plasma Glucose: 111.15 mg/dL

## 2017-11-20 LAB — APTT: aPTT: 32 seconds (ref 24–36)

## 2017-11-20 MED ORDER — ALBUTEROL SULFATE (2.5 MG/3ML) 0.083% IN NEBU
2.5000 mg | INHALATION_SOLUTION | Freq: Once | RESPIRATORY_TRACT | Status: AC
  Administered 2017-11-20: 2.5 mg via RESPIRATORY_TRACT

## 2017-11-20 NOTE — Pre-Procedure Instructions (Signed)
Brooke Chavez  11/20/2017      [facility] [place], [address] [name]  [address] Phone: [phone] Fax: [phone]    Your procedure is scheduled on Tuesday, April 16  Report to [facility] Admitting at 5:30 AM               Your surgery or procedure is scheduled for 7:30 AM   Call this number if you have problems the morning of surgery: [phone]- the pre surgery desk   Remember:  Do not eat food or drink liquids after midnight Monday, April 15.  Take these medicines the morning of surgery with A SIP OF WATER : no medications per DR. Roxy Manns     Stop Eliquis as instructed by Dr Roxy Manns.  STOP taking  Aspirin Products (Goody Powder, Excedrin Migraine), Ibuprofen (Advil), Naproxen (Aleve), Vitamins and Herbal Products (ie Fish Oil) Special instructions:   [place]- Preparing For Surgery  Before surgery, you can play an important role. Because skin is not sterile, your skin needs to be as free of germs as possible. You can reduce the number of germs on your skin by washing with CHG (chlorahexidine gluconate) Soap before surgery.  CHG is an antiseptic cleaner which kills germs and bonds with the skin to continue killing germs even after washing.  Please do not use if you have an allergy to CHG or antibacterial soaps. If your skin becomes reddened/irritated stop using the CHG.  Do not shave (including legs and underarms) for at least 48 hours prior to first CHG shower. It is OK to shave your face.  Please follow these instructions carefully.   1. Shower the NIGHT BEFORE SURGERY and the MORNING OF SURGERY with CHG.   2. If you chose to wash your hair, wash your hair first as usual with your normal shampoo.  3. After you shampoo, rinse your hair and body thoroughly to remove the shampoo.  4. Use CHG as you would any other liquid soap. You can apply CHG directly to the skin and wash gently with a scrungie or a clean  washcloth.   5. Apply the CHG Soap to your body ONLY FROM THE NECK DOWN.  Do not use on open wounds or open sores. Avoid contact with your eyes, ears, mouth and genitals (private parts). Wash Face and genitals (private parts)  with your normal soap.  6. Wash thoroughly, paying special attention to the area where your surgery will be performed.  7. Thoroughly rinse your body with warm water from the neck down.  8. DO NOT shower/wash with your normal soap after using and rinsing off the CHG Soap.  9. Pat yourself dry with a CLEAN TOWEL.  10. Wear CLEAN PAJAMAS to bed the night before surgery, wear comfortable clothes the morning of surgery  11. Place CLEAN SHEETS on your bed the night of your first shower and DO NOT SLEEP WITH PETS.  Day of Surgery: Shower as above Do not apply any deodorants/lotions, powders or colognes. Please wear clean clothes to the hospital/surgery center.    Do not wear jewelry, make-up or nail polish.  Do not shave 48 hours prior to surgery.  Men may shave face and neck.  Do not bring valuables to the hospital.  [facility] is not responsible for any belongings or valuables.  Contacts, dentures or bridgework may not be worn into surgery.  Leave your suitcase in the car.  After surgery it may be brought to your room.  For patients admitted to the hospital, discharge time will be determined by your treatment team.  Please read over the following fact sheets that you were given: Pain Booklet, Patient Instructions for Mupirocin Application, Incentive Spirometry, Surgical Site Infections.

## 2017-11-20 NOTE — Progress Notes (Signed)
PCP is Dr. [name] Cardiologist is Dr Percival Spanish Denies chest pain, cough, or fever. Reports she stopped taking Eliquis  4-919 as directed by Dr Edilia Bo noted 09-18-17 Stress test noted 11-11-17 Card cath noted 11-11-17 Ekg noted 11-11-17

## 2017-11-20 NOTE — Pre-Procedure Instructions (Signed)
Brooke Chavez  11/20/2017      [name], [name] [name] [address] Phone: [phone] Fax: [phone]    Your procedure is scheduled on Tuesday, April 16  Report to [facility] Admitting at 5:30 AM               Your surgery or procedure is scheduled for 7:30 AM   Call this number if you have problems the morning of surgery: [phone]- the pre surgery desk   Remember:  Do not eat food or drink liquids after midnight Monday, April 15.  Take these medicines the morning of surgery with A SIP OF WATER : amLODipine (NORVASC)   May take if needed: acetaminophen (TYLENOL)   Stop Eliquis as instructed by Dr Brooke Chavez.  STOP taking  Aspirin Products (Goody Powder, Excedrin Migraine), Ibuprofen (Advil), Naproxen (Aleve), Vitamins and Herbal Products (ie Fish Oil) Special instructions:   [place]- Preparing For Surgery  Before surgery, you can play an important role. Because skin is not sterile, your skin needs to be as free of germs as possible. You can reduce the number of germs on your skin by washing with CHG (chlorahexidine gluconate) Soap before surgery.  CHG is an antiseptic cleaner which kills germs and bonds with the skin to continue killing germs even after washing.  Please do not use if you have an allergy to CHG or antibacterial soaps. If your skin becomes reddened/irritated stop using the CHG.  Do not shave (including legs and underarms) for at least 48 hours prior to first CHG shower. It is OK to shave your face.  Please follow these instructions carefully.   1. Shower the NIGHT BEFORE SURGERY and the MORNING OF SURGERY with CHG.   2. If you chose to wash your hair, wash your hair first as usual with your normal shampoo.  3. After you shampoo, rinse your hair and body thoroughly to remove the shampoo.  4. Use CHG as you would any other liquid soap. You can apply CHG directly to the skin and  wash gently with a scrungie or a clean washcloth.   5. Apply the CHG Soap to your body ONLY FROM THE NECK DOWN.  Do not use on open wounds or open sores. Avoid contact with your eyes, ears, mouth and genitals (private parts). Wash Face and genitals (private parts)  with your normal soap.  6. Wash thoroughly, paying special attention to the area where your surgery will be performed.  7. Thoroughly rinse your body with warm water from the neck down.  8. DO NOT shower/wash with your normal soap after using and rinsing off the CHG Soap.  9. Pat yourself dry with a CLEAN TOWEL.  10. Wear CLEAN PAJAMAS to bed the night before surgery, wear comfortable clothes the morning of surgery  11. Place CLEAN SHEETS on your bed the night of your first shower and DO NOT SLEEP WITH PETS.  Day of Surgery: Shower as above Do not apply any deodorants/lotions, powders or colognes. Please wear clean clothes to the hospital/surgery center.    Do not wear jewelry, make-up or nail polish.  Do not shave 48 hours prior to surgery.  Men may shave face and neck.  Do not bring valuables to the hospital.  [facility] is not responsible for any belongings or valuables.  Contacts, dentures or bridgework may not be worn into surgery.  Leave your suitcase  in the car.  After surgery it may be brought to your room.  For patients admitted to the hospital, discharge time will be determined by your treatment team.  Please read over the following fact sheets that you were given: Pain Booklet, Patient Instructions for Mupirocin Application, Incentive Spirometry, Surgical Site Infections.

## 2017-11-20 NOTE — Progress Notes (Signed)
Pre-MVR testing has been completed. 1-39% ICA stenosis bilaterally.  11/20/17 2:37 PM Brooke Chavez RVT

## 2017-11-21 ENCOUNTER — Ambulatory Visit
Admission: RE | Admit: 2017-11-21 | Discharge: 2017-11-21 | Disposition: A | Discharge status: Home / Self Care | Payer: Medicare Other | Source: Ambulatory Visit | Attending: Thoracic Surgery (Cardiothoracic Vascular Surgery) | Admitting: Thoracic Surgery (Cardiothoracic Vascular Surgery)

## 2017-11-21 DIAGNOSIS — R160 Hepatomegaly, not elsewhere classified: Secondary | ICD-10-CM

## 2017-11-21 DIAGNOSIS — K7689 Other specified diseases of liver: Secondary | ICD-10-CM | POA: Diagnosis not present

## 2017-11-21 DIAGNOSIS — K8689 Other specified diseases of pancreas: Secondary | ICD-10-CM

## 2017-11-21 MED ORDER — GADOBENATE DIMEGLUMINE 529 MG/ML IV SOLN
7.0000 mL | Freq: Once | INTRAVENOUS | Status: AC | PRN
  Administered 2017-11-21: 7 mL via INTRAVENOUS

## 2017-11-23 ENCOUNTER — Encounter: Payer: Self-pay | Admitting: Thoracic Surgery (Cardiothoracic Vascular Surgery)

## 2017-11-23 ENCOUNTER — Encounter (HOSPITAL_COMMUNITY): Payer: Self-pay | Admitting: Certified Registered"

## 2017-11-23 ENCOUNTER — Telehealth: Payer: Self-pay | Admitting: Thoracic Surgery (Cardiothoracic Vascular Surgery)

## 2017-11-23 LAB — BLOOD GAS, ARTERIAL
Acid-base deficit: 0.6 mmol/L (ref 0.0–2.0)
Bicarbonate: 22.9 mmol/L (ref 20.0–28.0)
Drawn by: 42180
FIO2: 21
O2 Saturation: 97.6 %
Patient temperature: 98.6
pCO2 arterial: 33.6 mmHg (ref 32.0–48.0)
pH, Arterial: 7.449 (ref 7.350–7.450)
pO2, Arterial: 99.1 mmHg (ref 83.0–108.0)

## 2017-11-23 MED ORDER — KENNESTONE BLOOD CARDIOPLEGIA VIAL
13.0000 mL | Status: DC
  Filled 2017-11-23: qty 1

## 2017-11-23 MED ORDER — VANCOMYCIN HCL 1000 MG IV SOLR
INTRAVENOUS | Status: DC
  Filled 2017-11-23: qty 1000

## 2017-11-23 MED ORDER — INSULIN REGULAR HUMAN 100 UNIT/ML IJ SOLN
INTRAMUSCULAR | Status: DC
  Filled 2017-11-23: qty 1

## 2017-11-23 MED ORDER — PLASMA-LYTE 148 IV SOLN
INTRAVENOUS | Status: DC
  Filled 2017-11-23: qty 2.5

## 2017-11-23 MED ORDER — CEFUROXIME SODIUM 750 MG IJ SOLR
750.0000 mg | INTRAMUSCULAR | Status: DC
  Filled 2017-11-23: qty 750

## 2017-11-23 MED ORDER — DOPAMINE-DEXTROSE 3.2-5 MG/ML-% IV SOLN
0.0000 ug/kg/min | INTRAVENOUS | Status: DC
  Filled 2017-11-23: qty 250

## 2017-11-23 MED ORDER — VANCOMYCIN HCL 10 G IV SOLR
1250.0000 mg | INTRAVENOUS | Status: DC
  Filled 2017-11-23: qty 1250

## 2017-11-23 MED ORDER — TRANEXAMIC ACID (OHS) BOLUS VIA INFUSION
15.0000 mg/kg | INTRAVENOUS | Status: DC
  Filled 2017-11-23: qty 1113

## 2017-11-23 MED ORDER — DEXMEDETOMIDINE HCL IN NACL 400 MCG/100ML IV SOLN
0.1000 ug/kg/h | INTRAVENOUS | Status: DC
  Filled 2017-11-23: qty 100

## 2017-11-23 MED ORDER — TRANEXAMIC ACID (OHS) PUMP PRIME SOLUTION
2.0000 mg/kg | INTRAVENOUS | Status: DC
  Filled 2017-11-23: qty 1.48

## 2017-11-23 MED ORDER — SODIUM CHLORIDE 0.9 % IV SOLN
1.5000 g | INTRAVENOUS | Status: DC
  Filled 2017-11-23: qty 1.5

## 2017-11-23 MED ORDER — KENNESTONE BLOOD CARDIOPLEGIA (KBC) MANNITOL SYRINGE (20%, 32ML)
32.0000 mL | INTRAVENOUS | Status: DC
  Filled 2017-11-23: qty 1

## 2017-11-23 MED ORDER — TRANEXAMIC ACID 1000 MG/10ML IV SOLN
1.5000 mg/kg/h | INTRAVENOUS | Status: DC
  Filled 2017-11-23: qty 25

## 2017-11-23 MED ORDER — EPINEPHRINE PF 1 MG/ML IJ SOLN
0.0000 ug/min | INTRAVENOUS | Status: DC
  Filled 2017-11-23: qty 4

## 2017-11-23 MED ORDER — SODIUM CHLORIDE 0.9 % IV SOLN
INTRAVENOUS | Status: DC
  Filled 2017-11-23: qty 30

## 2017-11-23 MED ORDER — POTASSIUM CHLORIDE 2 MEQ/ML IV SOLN
80.0000 meq | INTRAVENOUS | Status: DC
  Filled 2017-11-23: qty 40

## 2017-11-23 MED ORDER — NITROGLYCERIN IN D5W 200-5 MCG/ML-% IV SOLN
2.0000 ug/min | INTRAVENOUS | Status: DC
  Filled 2017-11-23: qty 250

## 2017-11-23 MED ORDER — SODIUM CHLORIDE 0.9 % IV SOLN
30.0000 ug/min | INTRAVENOUS | Status: DC
  Filled 2017-11-23 (×2): qty 2

## 2017-11-23 MED ORDER — MAGNESIUM SULFATE 50 % IJ SOLN
40.0000 meq | INTRAMUSCULAR | Status: DC
  Filled 2017-11-23: qty 9.85

## 2017-11-23 MED ORDER — GLUTARALDEHYDE 0.625% SOAKING SOLUTION
TOPICAL | Status: DC
  Filled 2017-11-23: qty 50

## 2017-11-23 NOTE — H&P (Signed)
[address]       [place] [address]             [phone]          CARDIOTHORACIC SURGERY HISTORY AND PHYSICAL EXAM  Referring Provider is [name], MD PCP is [name], FNP      Chief Complaint  Patient presents with  . Mitral Regurgitation    New patient evaluation, TEE 09/18/2017    HPI:  Patient is a 77 year old African-American female with history of mitral regurgitation, atrial flutter status post ablation, hypertension, and degenerative joint disease who has been referred for surgical consultation to discuss treatment option for management of mitral valve prolapse with severe primary mitral regurgitation.  Patient's cardiac history dates back several years ago to 2011 when she was diagnosed with atrial flutter.  She underwent cardioversion and later underwent catheter-based ablation in 2012.  She has been followed regularly ever since by Dr. Percival Spanish.  Previous echocardiograms have demonstrated the presence of normal left ventricular systolic function with mitral valve prolapse and what was felt to be moderate mitral regurgitation.  Follow-up transthoracic echocardiogram performed June 12, 2017 revealed significant change in left ventricular systolic function with ejection fraction estimated at only 45-50%.  She subsequently underwent transesophageal echocardiogram on September 18, 2017 which revealed similar findings, with mild left ventricular systolic dysfunction and ejection fraction estimated 45-50%.  There was mitral valve prolapse with severe mitral regurgitation confirmed by presence of systolic flow reversal in the pulmonary veins.  There was severe left atrial enlargement.  There was a patent foramen ovale.  Right ventricular size and function was normal.  The right atrium was dilated.  There was only mild tricuspid regurgitation.  Cardiothoracic surgical consultation was requested.  The patient is widowed and lives  locally in [place] with one of her daughters and her grandson.  She has been retired since 2000, having previously worked in the [employer].  She has remained reasonably active throughout retirement and continues to work 2 or 3 days a week doing light housework.  She drives an automobile and remains functionally independent.  She describes stable symptoms of exertional shortness of breath.  She states that she gets short of breath when she walks up an incline or tries to go up a flight of stairs.  She denies shortness of breath with low level activity or at rest.  She has not had any PND, or orthopnea.  She reports occasional palpitation.  She has not had dizzy spells or syncope.  She has mild chronic lower extremity edema.  Patient was originally seen in consultation on November 02, 2017.  Since then she underwent left and right heart catheterization by Dr. Burt Knack on November 11, 2017.  She was found to have widely patent coronary arteries with no significant coronary artery disease.  Right heart pressures were minimally elevated.  There were large V waves on wedge tracing consistent with severe mitral regurgitation.  The patient subsequently underwent CT angiography and returns to our office today with tentative plans to proceed with minimally invasive mitral valve repair next week.  She reports no new problems or complaints.   Past Medical History:  Diagnosis Date  . Atrial flutter ([place])    Typical by EKG diagnosis 9/11 s/p CIT ablation 11/11  . Bradycardia   . DJD (degenerative joint disease)   . Dysrhythmia   . Femur fracture, left ([place]) 2013  . Heart murmur   . Hyperlipidemia  x5 years  . Hypertension    Since 1997  . Mitral regurgitation    severe  . 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 A FLUTTER ABLATION N/A 06/27/2011   Procedure: ABLATION A FLUTTER;  Surgeon: Thompson Grayer, MD;  Location: [facility] CATH LAB;   Service: Cardiovascular;  Laterality: N/A;  . ATRIAL ABLATION SURGERY  06/2010  . HIP SURGERY     Left (fracture) 3/13  . JOINT REPLACEMENT     both knees   . KNEE ARTHROSCOPY     Right  . LUMBAR SPINE SURGERY    . RIGHT/LEFT HEART CATH AND CORONARY ANGIOGRAPHY N/A 11/11/2017   Procedure: RIGHT/LEFT HEART CATH AND CORONARY ANGIOGRAPHY;  Surgeon: Sherren Mocha, MD;  Location: [facility] CV LAB;  Service: Cardiovascular;  Laterality: N/A;  . TEE WITHOUT CARDIOVERSION N/A 09/18/2017   Procedure: TRANSESOPHAGEAL ECHOCARDIOGRAM (TEE);  Surgeon: Skeet Latch, MD;  Location: [facility];  Service: Cardiovascular;  Laterality: N/A;  . TOTAL KNEE ARTHROPLASTY     Left  . TUBAL LIGATION      Family History  Problem Relation Age of Onset  . Hypertension Mother   . Cancer Sister        leukemia  . Diabetes Brother   . Stroke Sister   . Diabetes Brother   . Heart attack Brother   . Healthy Daughter   . Healthy Son   . Healthy Daughter     Social History Social History   Tobacco Use  . Smoking status: Never Smoker  . Smokeless tobacco: Never Used  Substance Use Topics  . Alcohol use: No  . Drug use: No    Prior to Admission medications   Medication Sig Start Date End Date Taking? Authorizing Provider  acetaminophen (TYLENOL) 500 MG tablet Take 1,000 mg by mouth every 6 (six) hours as needed for moderate pain or headache.   Yes [provider]  amLODipine (NORVASC) 10 MG tablet Take 1 tablet (10 mg total) by mouth at bedtime. (NEEDS TO BE SEEN FOR REFILLS) 11/03/17  Yes Hassell Done, Mary-Margaret, FNP  apixaban (ELIQUIS) 5 MG TABS tablet Take 1 tablet (5 mg total) by mouth 2 (two) times daily. 11/03/17  Yes Martin, Mary-Margaret, FNP  atorvastatin (LIPITOR) 40 MG tablet Take 1 tablet (40 mg total) by mouth at bedtime. 11/03/17  Yes Martin, Mary-Margaret, FNP  hydrochlorothiazide (HYDRODIURIL) 25 MG tablet Take 1 tablet (25 mg total) by mouth daily. 11/03/17  Yes Martin,  Mary-Margaret, FNP  IRON PO Take 1 tablet by mouth daily as needed (If iron is low before donating blood will take tablets).    Yes [provider]  spironolactone (ALDACTONE) 25 MG tablet Take 1 tablet (25 mg total) by mouth at bedtime as needed. Patient taking differently: Take 25 mg by mouth at bedtime.  11/03/17  Yes Hassell Done, Mary-Margaret, FNP  famotidine (PEPCID) 20 MG tablet Take 1 tablet (20 mg total) by mouth 2 (two) times daily. 07/29/11 10/29/11  Richardson Dopp T, PA-C    Allergies  Allergen Reactions  . Aspirin Nausea And Vomiting  . Oxycodone-Acetaminophen Anxiety and Other (See Comments)    Hallucinations      Review of Systems:              General:                      decreased appetite, normal energy, no  weight gain, slight weight loss, no fever             Cardiac:                       no chest pain with exertion, no chest pain at rest, + SOB with exertion, no resting SOB, no PND, no orthopnea, + palpitations, + arrhythmia, no atrial fibrillation, + LE edema, no dizzy spells, no syncope             Respiratory:                 Mild exertional shortness of breath, no home oxygen, no productive cough, occasional dry cough, no bronchitis, no wheezing, no hemoptysis, no asthma, no pain with inspiration or cough, no sleep apnea, no CPAP at night             GI:                               no difficulty swallowing, no reflux, no frequent heartburn, no hiatal hernia, no abdominal pain, no constipation, + occasional diarrhea, no hematochezia, no hematemesis, no melena             GU:                              no dysuria,  no frequency, no urinary tract infection, no hematuria, no kidney stones, no kidney disease             Vascular:                     no pain suggestive of claudication, no pain in feet, + frequent leg cramps, no varicose veins, no DVT, no non-healing foot ulcer             Neuro:                         no stroke, no TIA's, no seizures, no  headaches, no temporary blindness one eye,  no slurred speech, no peripheral neuropathy, no chronic pain, no instability of gait, very mild memory/cognitive dysfunction             Musculoskeletal:         + arthritis, no joint swelling, no myalgias, no difficulty walking, normal mobility              Skin:                            no rash, no itching, no skin infections, no pressure sores or ulcerations             Psych:                         no anxiety, no depression, no nervousness, no unusual recent stress             Eyes:                           no blurry vision, no floaters, no recent vision changes, + wears glasses or contacts             ENT:                            +  hearing loss, no loose or painful teeth, + dentures, last saw dentist within the past year             Hematologic:               no easy bruising, no abnormal bleeding, no clotting disorder, no frequent epistaxis             Endocrine:                   no diabetes, does not check CBG's at home                           Physical Exam:              BP (!) 150/60 (BP Location: Right Arm, Patient Position: Sitting, Cuff Size: Normal)   Pulse (!) 57   Resp 18   Ht 5\' 3"  (1.6 m)   Wt 165 lb 9.6 oz (75.1 kg)   LMP 11/05/1992   SpO2 97% Comment: RA  BMI 29.33 kg/m              General:                      Moderately obese,  well-appearing             HEENT:                       Unremarkable              Neck:                           no JVD, no bruits, no adenopathy              Chest:                          clear to auscultation, symmetrical breath sounds, no wheezes, no rhonchi              CV:                              RRR, grade IV/VI late systolic murmur              Abdomen:                    soft, non-tender, no masses              Extremities:                 warm, well-perfused, pulses diminished, 1+ bilateral LE edema             Rectal/GU                   Deferred             Neuro:                          Grossly non-focal and symmetrical throughout             Skin:                            Clean and dry, no  rashes, no breakdown   Diagnostic Tests:  Transthoracic Echocardiography  Patient: Jolyn, Deshmukh MR #: [id number] Study Date: 06/29/2015 Gender: F Age: 82 Height: 160 cm Weight: 77.6 kg BSA: 1.88 m^2 Pt. Status: Room:  ATTENDING [name], MD [place], MD [place], MD SONOGRAPHER Leavy Cella PERFORMING [facility]  cc:  ------------------------------------------------------------------- LV EF: 60% - 65%  ------------------------------------------------------------------- Indications: Atrial flutter 427.32.  ------------------------------------------------------------------- History: PMH: Mitral Regurge, Bradycardia, A flutter. Risk factors: Hypertension. Dyslipidemia.  ------------------------------------------------------------------- Study Conclusions  - Left ventricle: The cavity size was normal. Wall thickness was increased in a pattern of mild LVH. Systolic function was normal. The estimated ejection fraction was in the range of 60% to 65%. Wall motion was normal; there were no regional wall motion abnormalities. Left ventricular diastolic function parameters were normal. - Mitral valve: Mild anterior leaflet prolapse with moderate, posteriorly directed regurgitation. - Left atrium: The atrium was moderately dilated.  Transthoracic echocardiography. M-mode, complete 2D, spectral Doppler, and color Doppler. Birthdate: Patient birthdate: 1940/12/23. Age: Patient is 77 yr old. Sex: Gender: female. BMI: 30.3 kg/m^2. Blood pressure: 120/53 Patient status: Outpatient. Study date: Study date: 06/29/2015. Study time: 11:27 AM. Location: Echo  laboratory.  -------------------------------------------------------------------  ------------------------------------------------------------------- Left ventricle: The cavity size was normal. Wall thickness was increased in a pattern of mild LVH. Systolic function was normal. The estimated ejection fraction was in the range of 60% to 65%. Wall motion was normal; there were no regional wall motion abnormalities. The transmitral flow pattern was normal. The deceleration time of the early transmitral flow velocity was normal. The pulmonary vein flow pattern was normal. The tissue Doppler parameters were normal. Left ventricular diastolic function parameters were normal.  ------------------------------------------------------------------- Aortic valve: Structurally normal valve. Cusp separation was normal. Doppler: Transvalvular velocity was within the normal range. There was no stenosis. There was no regurgitation.  ------------------------------------------------------------------- Aorta: Aortic root: The aortic root was normal in size.  ------------------------------------------------------------------- Mitral valve: Mild anterior leaflet prolapse with moderate, posteriorly directed regurgitation. Normal thickness leaflets . Doppler: Peak gradient (D): 4 mm Hg.  ------------------------------------------------------------------- Left atrium: The atrium was moderately dilated.  ------------------------------------------------------------------- Atrial septum: No defect or patent foramen ovale was identified.  ------------------------------------------------------------------- Right ventricle: The cavity size was normal. Wall thickness was normal. Systolic function was normal.  ------------------------------------------------------------------- Pulmonic valve: Poorly visualized. Doppler: There was no significant  regurgitation.  ------------------------------------------------------------------- Tricuspid valve: Structurally normal valve. Leaflet separation was normal. Doppler: Transvalvular velocity was within the normal range. There was no regurgitation.  ------------------------------------------------------------------- Right atrium: The atrium was normal in size.  ------------------------------------------------------------------- Pericardium: There was no pericardial effusion.  ------------------------------------------------------------------- Systemic veins: Inferior vena cava: The vessel was normal in size. The respirophasic diameter changes were in the normal range (>= 50%), consistent with normal central venous pressure.  ------------------------------------------------------------------- Measurements  Left ventricle Value Reference LV ID, ED, PLAX chordal 51 mm 43 - 52 LV ID, ES, PLAX chordal 35.1 mm 23 - 38 LV fx shortening, PLAX chordal 31 % >=29 LV PW thickness, ED 14.7 mm --------- IVS/LV PW ratio, ED 0.66 <=1.3 LV e&', lateral 11.5 cm/s --------- LV E/e&', lateral 8.39 --------- LV e&', medial 9.24 cm/s --------- LV E/e&', medial 10.44 --------- LV e&', average 10.37 cm/s --------- LV E/e&', average 9.31 ---------  Ventricular septum Value Reference IVS thickness, ED 9.67 mm ---------  Aorta Value Reference Aortic root ID, ED 31 mm ---------  Left atrium Value Reference LA ID,  A-P, ES 42 mm --------- LA ID/bsa, A-P (H) 2.23  cm/m^2 <=2.2 LA volume, ES, 1-p A4C 82.7 ml --------- LA volume/bsa, ES, 1-p A4C 43.9 ml/m^2 --------- LA volume, ES, 1-p A2C 56.9 ml --------- LA volume/bsa, ES, 1-p A2C 30.2 ml/m^2 ---------  Mitral valve Value Reference Mitral E-wave peak velocity 96.5 cm/s --------- Mitral A-wave peak velocity 64.5 cm/s --------- Mitral deceleration time 215 ms 150 - 230 Mitral peak gradient, D 4 mm Hg --------- Mitral E/A ratio, peak 1.5 ---------  Systemic veins Value Reference Estimated CVP 3 mm Hg ---------  Legend: (L) and (H) mark values outside specified reference range.  ------------------------------------------------------------------- Prepared and Electronically Authenticated by  Kate Sable, MD 2016-11-18T17:58:39   Transthoracic Echocardiography  Patient: Larin, Depaoli MR #: [id number] Study Date: 06/12/2017 Gender: F Age: 6 Height: 157.5 cm Weight: 73.9 kg BSA: 1.82 m^2 Pt. Status: Room:  ATTENDING [name], MD ORDERING [name], MD REFERRING [name], MD SONOGRAPHER Cindy Hazy, RDCS PERFORMING [facility]  cc:  ------------------------------------------------------------------- LV EF: 45% - 50%  ------------------------------------------------------------------- Indications: I05.9 Mitral Valve Disorder.  ------------------------------------------------------------------- History: PMH: Acquired from the patient and from the patient&'s chart. PMH: Atrial Flutter. Tricuspid and Mitral  Regurgitation. Bradycardia. Degenerative joint disease. Risk factors: Hypertension. Dyslipidemia.  ------------------------------------------------------------------- Study Conclusions  - Left ventricle: The cavity size was normal. There was mild concentric hypertrophy. Systolic function was mildly reduced. The estimated ejection fraction was in the range of 45% to 50%. Wall motion was normal; there were no regional wall motion abnormalities. - Aortic valve: Trileaflet; normal thickness leaflets. There was no regurgitation. - Mitral valve: Thickened mitral valve with prolapse of the anterior leaflet and posteriorly directed jet of mitral regurgitation. There was mild regurgitation directed posteriorly. - Left atrium: The atrium was severely dilated. - Right ventricle: The cavity size was normal. Wall thickness was normal. Systolic function was normal. - Right atrium: The atrium was moderately dilated. - Tricuspid valve: There was mild regurgitation. - Pulmonary arteries: Systolic pressure was moderately increased. PA peak pressure: 50 mm Hg (S).  Impressions:  - When compared to the prior study from 06/29/2015 there are significant changes. LVEF has dropped from 60-65% to 45-50%. Thickened mitral valve with prolapse of the anterior leaflet and posteriorly directed jet of mitral regurgitation. Mitral regurgitation is at least moderate, most probably severe. Left atrium is now severely dilated and there is new moderate pulmonary hypertension. A TEE is recommended for further evaluation, surgical consult should be considered.  ------------------------------------------------------------------- Study data: Study status: Routine. Procedure: The patient reported no pain pre or post test. Transthoracic echocardiography for left ventricular function evaluation, for right ventricular function evaluation, and for assessment of valvular  function. Image quality was adequate. Study completion: There were no complications. Transthoracic echocardiography. M-mode, complete 2D, spectral Doppler, and color Doppler. Birthdate: Patient birthdate: [birth year]. Age: Patient is 77 yr old. Sex: Gender: female. BMI: 29.8 kg/m^2. Patient status: Outpatient. Study date: Study date: 06/12/2017. Study time: 01:12 PM. Location: [facility]  -------------------------------------------------------------------  ------------------------------------------------------------------- Left ventricle: The cavity size was normal. There was mild concentric hypertrophy. Systolic function was mildly reduced. The estimated ejection fraction was in the range of 45% to 50%. Wall motion was normal; there were no regional wall motion abnormalities. The study was not technically sufficient to allow evaluation of LV diastolic dysfunction due to atrial fibrillation.  ------------------------------------------------------------------- Aortic valve: Trileaflet; normal thickness leaflets. Mobility was not restricted. Doppler: Transvalvular velocity was within the normal range. There was no stenosis. There was no regurgitation.  ------------------------------------------------------------------- Aorta: Aortic root: The aortic root  was normal in size.  ------------------------------------------------------------------- Mitral valve: Thickened mitral valve with prolapse of the anterior leaflet and posteriorly directed jet of mitral regurgitation. Mobility was not restricted. Doppler: Transvalvular velocity was within the normal range. There was no evidence for stenosis. There was mild regurgitation directed posteriorly. Peak gradient (D): 4 mm Hg.  ------------------------------------------------------------------- Left atrium: The atrium was severely  dilated.  ------------------------------------------------------------------- Right ventricle: The cavity size was normal. Wall thickness was normal. Systolic function was normal.  ------------------------------------------------------------------- Pulmonic valve: Structurally normal valve. Cusp separation was normal. Doppler: Transvalvular velocity was within the normal range. There was no evidence for stenosis. There was no regurgitation.  ------------------------------------------------------------------- Tricuspid valve: Structurally normal valve. Doppler: Transvalvular velocity was within the normal range. There was mild regurgitation.  ------------------------------------------------------------------- Pulmonary artery: The main pulmonary artery was normal-sized. Systolic pressure was moderately increased.  ------------------------------------------------------------------- Right atrium: The atrium was moderately dilated.  ------------------------------------------------------------------- Pericardium: There was no pericardial effusion.  ------------------------------------------------------------------- Systemic veins: Inferior vena cava: The vessel was normal in size.  ------------------------------------------------------------------- Measurements  Left ventricle Value Reference LV ID, ED, PLAX chordal (H) 52.7 mm 43 - 52 LV ID, ES, PLAX chordal 33.3 mm 23 - 38 LV fx shortening, PLAX chordal 37 % >=29 LV PW thickness, ED 12.9 mm --------- IVS/LV PW ratio, ED 0.7 <=1.3 Stroke volume, 2D 74 ml --------- Stroke volume/bsa, 2D 41 ml/m^2 --------- LV e&', lateral 16.4 cm/s --------- LV E/e&', lateral 6.1  --------- LV e&', medial 9.76 cm/s --------- LV E/e&', medial 10.25 --------- LV e&', average 13.08 cm/s --------- LV E/e&', average 7.65 ---------  Ventricular septum Value Reference IVS thickness, ED 9.09 mm ---------  LVOT Value Reference LVOT ID, S 22 mm --------- LVOT area 3.8 cm^2 --------- LVOT ID 22 mm --------- LVOT peak velocity, S 79.4 cm/s --------- LVOT mean velocity, S 52.7 cm/s --------- LVOT VTI, S 19.4 cm --------- LVOT peak gradient, S 3 mm Hg --------- Stroke volume (SV), LVOT DP 73.7 ml --------- Stroke index (SV/bsa), LVOT DP 40.4 ml/m^2 ---------  Aorta Value Reference Aortic root ID, ED 33 mm ---------  Left atrium Value Reference LA ID, A-P, ES 52 mm --------- LA ID/bsa, A-P (H) 2.85 cm/m^2 <=2.2 LA volume, S 99 ml --------- LA volume/bsa, S 54.3 ml/m^2 --------- LA volume, ES, 1-p A4C 104 ml --------- LA volume/bsa, ES, 1-p A4C 57 ml/m^2 --------- LA volume, ES, 1-p A2C 90 ml --------- LA volume/bsa, ES, 1-p A2C 49.3 ml/m^2 ---------  Mitral valve Value Reference Mitral E-wave peak velocity 100 cm/s --------- Mitral peak gradient, D 4 mm Hg  ---------  Pulmonary arteries Value Reference PA pressure, S, DP (H) 50 mm Hg <=30  Tricuspid valve Value Reference Tricuspid regurg peak velocity 342 cm/s --------- Tricuspid peak RV-RA gradient 47 mm Hg ---------  Right ventricle Value Reference RV s&', lateral, S 15.36 cm/s ---------  Legend: (L) and (H) mark values outside specified reference range.  ------------------------------------------------------------------- Prepared and Electronically Authenticated by  Ena Dawley, M.D. 2018-11-02T14:35:11   Transesophageal Echocardiography  Patient: Kimberla, Driskill MR #: [id number] Study Date: 09/18/2017 Gender: F Age: 28 Height: 160 cm Weight: 72.6 kg BSA: 1.82 m^2 Pt. Status: Room:  SONOGRAPHER [facility] ADMITTING Skeet Latch, MD ATTENDING Skeet Latch, MD ORDERING Skeet Latch, MD PERFORMING Skeet Latch, MD [place], MD  cc:  ------------------------------------------------------------------- LV EF: 45% - 50%  ------------------------------------------------------------------- Indications: Mitral valve prolapse 424.0.  ------------------------------------------------------------------- History: PMH: Atrial flutter. Mitral valve disease. Risk factors: Hypertension. Dyslipidemia.  ------------------------------------------------------------------- Study Conclusions  - Left  ventricle: Systolic function was mildly reduced. The estimated ejection fraction was in the range of 45% to 50%. Wall motion was normal; there were no regional wall motion abnormalities. - Aortic valve: There was trivial regurgitation. - Mitral valve: Elongation, consistent with  myxomatous proliferation. Moderate prolapse, involving the middle segment of the anterior leaflet and the medial scallop of the posterior leaflet. There was severe regurgitation directed eccentrically and posteriorly. Severe regurgitation is suggested by pulmonary vein systolic flow reversal. - Left atrium: The atrium was severely dilated. No evidence of thrombus in the atrial cavity or appendage. No evidence of thrombus in the atrial cavity or appendage. - Right ventricle: Systolic function was normal. - Right atrium: The atrium was severely dilated. No evidence of thrombus in the atrial cavity or appendage. - Atrial septum: There was a patent foramen ovale with left to right flow at rest. - Tricuspid valve: There was mild regurgitation. Peak RV-RA gradient (S): 41 mm Hg.  ------------------------------------------------------------------- Study data: Study status: Routine. Consent: The risks, benefits, and alternatives to the procedure were explained to the patient and informed consent was obtained. Procedure: The patient reported no pain pre or post test. Initial setup. The patient was brought to the laboratory. Surface ECG leads were monitored. Sedation. Conscious sedation was administered by cardiology staff. Transesophageal echocardiography. Topical anesthesia was obtained using viscous lidocaine. An adult multiplane transesophageal probe was inserted by the attending cardiologistwithout difficulty. Image quality was adequate. Study completion: The patient tolerated the procedure well. There were no complications. Administered medications: Fentanyl, 62.30mcg, IV. Midazolam, 4mg , IV. Diagnostic transesophageal echocardiography. 2D and color Doppler. Birthdate: Patient birthdate: Dec 04, 1940. Age: Patient is 77 yr old. Sex: Gender: female. BMI: 28.4 kg/m^2. Blood pressure: 171/67 Patient status: Outpatient. Study date: Study  date: 09/18/2017. Study time: 11:44 AM. Location: Endoscopy.  -------------------------------------------------------------------  ------------------------------------------------------------------- Left ventricle: Systolic function was mildly reduced. The estimated ejection fraction was in the range of 45% to 50%. Wall motion was normal; there were no regional wall motion abnormalities.  ------------------------------------------------------------------- Aortic valve: Structurally normal valve. Trileaflet; normal thickness leaflets. Cusp separation was normal. Doppler: There was trivial regurgitation.  ------------------------------------------------------------------- Aorta: There was no atheroma. There was no evidence for dissection. Aortic root: The aortic root was not dilated. Ascending aorta: The ascending aorta was normal in size. Aortic arch: The aortic arch was normal in size. Descending aorta: The descending aorta was normal in size.  ------------------------------------------------------------------- Mitral valve: Elongation, consistent with myxomatous proliferation. Leaflet separation was normal. Moderate prolapse, involving the middle segment of the anterior leaflet and the medial scallop of the posterior leaflet. Doppler: There was severe regurgitation directed eccentrically and posteriorly. Severe regurgitation is suggested by pulmonary vein systolic flow reversal.  ------------------------------------------------------------------- Left atrium: The atrium was severely dilated. No evidence of thrombus in the atrial cavity or appendage. No evidence of thrombus in the atrial cavity or appendage. The appendage was morphologically a left appendage, multilobulated, and of normal size. Emptying velocity was normal.  ------------------------------------------------------------------- Atrial septum: There was a patent foramen ovale with left to  right flow at rest.  ------------------------------------------------------------------- Right ventricle: The cavity size was normal. Wall thickness was normal. Systolic function was normal.  ------------------------------------------------------------------- Pulmonic valve: Structurally normal valve.  ------------------------------------------------------------------- Tricuspid valve: Structurally normal valve. Leaflet separation was normal. Doppler: There was mild regurgitation.  ------------------------------------------------------------------- Pulmonary artery: The main pulmonary artery was normal-sized.  ------------------------------------------------------------------- Right atrium: The atrium was severely dilated. No evidence of thrombus in the atrial cavity or appendage. The appendage was morphologically a right appendage.  -------------------------------------------------------------------  Pericardium: There was no pericardial effusion.  ------------------------------------------------------------------- Measurements  Mitral valve Value Mitral regurg VTI, PISA 207 cm Mitral ERO, PISA 0.3 cm^2 Mitral regurg volume, PISA 62 ml  Tricuspid valve Value Tricuspid regurg peak velocity 318.45 cm/s Tricuspid peak RV-RA gradient 41 mm Hg Tricuspid maximal regurg velocity, PISA 318.45 cm/s  Legend: (L) and (H) mark values outside specified reference range.  ------------------------------------------------------------------- Prepared and Electronically Authenticated by  Skeet Latch, MD 2019-02-08T17:17:53     RIGHT/LEFT HEART CATH AND CORONARY ANGIOGRAPHY  Conclusion   1. Widely patent coronary arteries without obstructive CAD 2.  Essentially normal right heart hemodynamics with minimal elevation of pulmonary artery pressure 3. Prominent V wave and pulmonary capillary wedge tracing suggestive of hemodynamically significant mitral regurgitation    Indications   Severe mitral regurgitation [I34.0 (ICD-10-CM)]  Procedural Details/Technique   Technical Details INDICATION: Severe mitral regurgitation (preoperative study)  PROCEDURAL DETAILS: Ultrasound guidance is used to access the right femoral vein. The ultrasound images captured and stored in the patient's chart. A 7 French sheath is inserted. A Swan-Ganz catheter is used for the right heart procedure. Pressures were recorded and oxygen saturations are obtained. Fick cardiac output is calculated. Using the modified Seldinger technique a 5/6 French Slender sheath was placed in the right radial artery. Intra-arterial verapamil was administered through the radial artery sheath. IV heparin was administered after a JR4 catheter was advanced into the central aorta. A Swan-Ganz catheter was used for the right heart catheterization. Standard protocol was followed for recording of right heart pressures and sampling of oxygen saturations. Fick cardiac output was calculated. Standard Judkins catheters were used for selective coronary angiography. Left ventricular pressure is recorded with a JR4 catheter. Because of subclavian tortuosity, a 5 [place] JR4 guide catheter was used to engage the right coronary artery. There were no immediate procedural complications. The patient was transferred to the post catheterization recovery area for further monitoring.    Estimated blood loss <50 mL.  During this procedure the patient was administered the following to achieve and maintain moderate conscious sedation: Versed 1 mg, Fentanyl 25 mcg, while the patient's heart rate, blood pressure, and oxygen saturation were continuously monitored. The period of conscious sedation was 31 minutes, of  which I was present face-to-face 100% of this time.  Coronary Findings   Diagnostic  Dominance: Right  Left Main  The vessel exhibits minimal luminal irregularities.  Left Anterior Descending  The vessel exhibits minimal luminal irregularities.  Left Circumflex  The vessel exhibits minimal luminal irregularities.  Right Coronary Artery  The vessel exhibits minimal luminal irregularities.  Intervention   No interventions have been documented.  Coronary Diagrams   Diagnostic Diagram       Implants       No implant documentation for this case.  MERGE Images   Show images for CARDIAC CATHETERIZATION   Link to Procedure Log   Procedure Log    Hemo Data    Most Recent Value  Fick Cardiac Output 5.87 L/min  Fick Cardiac Output Index 3.32 (L/min)/BSA  Aortic Mean Gradient 5 mmHg  Aortic Peak Gradient 1 mmHg  Aortic Valve Area 3.10  Aortic Value Area Index 1.75 cm2/BSA  RA A Wave -99 mmHg  RA V Wave 0 mmHg  RA Mean 2 mmHg  RV Systolic Pressure 47 mmHg  RV Diastolic Pressure -3 mmHg  RV EDP 1 mmHg  PA Systolic Pressure 49 mmHg  PA Diastolic Pressure 11 mmHg  PA Mean 23 mmHg  PW A  Wave -99 mmHg  PW V Wave 17 mmHg  PW Mean 9 mmHg  AO Systolic Pressure 175 mmHg  AO Diastolic Pressure 49 mmHg  AO Mean 82 mmHg  LV Systolic Pressure 102 mmHg  LV Diastolic Pressure 0 mmHg  LV EDP 5 mmHg  Arterial Occlusion Pressure Extended Systolic Pressure 585 mmHg  Arterial Occlusion Pressure Extended Diastolic Pressure 62 mmHg  Arterial Occlusion Pressure Extended Mean Pressure 90 mmHg  Left Ventricular Apex Extended Systolic Pressure 277 mmHg  Left Ventricular Apex Extended Diastolic Pressure 0 mmHg  Left Ventricular Apex Extended EDP Pressure 2 mmHg  QP/QS 1  TPVR Index 6.94 HRUI  TSVR Index 26.82 HRUI  PVR SVR Ratio 0.16  TPVR/TSVR Ratio 0.26    CT ANGIOGRAPHY CHEST, ABDOMEN AND PELVIS  TECHNIQUE: Multidetector CT imaging through the chest, abdomen and  pelvis was performed using the standard protocol during bolus administration of intravenous contrast. Multiplanar reconstructed images and MIPs were obtained and reviewed to evaluate the vascular anatomy.  CONTRAST: 55mL ISOVUE-370 IOPAMIDOL (ISOVUE-370) INJECTION 76% per Dr. Anselm Pancoast  COMPARISON: 06/09/2011 by report only  FINDINGS: CTA CHEST FINDINGS  Cardiovascular: Mild four-chamber cardiac enlargement. No pericardial effusion. Mildly dilated central pulmonary arteries. Satisfactory opacification of pulmonary arteries noted, and there is no evidence of pulmonary emboli. Scattered coronary calcifications. Adequate contrast opacification of the thoracic aorta with no evidence of dissection, aneurysm, or stenosis. There is bovine variant brachiocephalic arch anatomy without proximal stenosis. Scattered calcified plaque in the arch and descending thoracic aorta.  Mediastinum/Nodes: No hilar or mediastinal adenopathy.  Lungs/Pleura: Lungs are clear. No pleural effusion or pneumothorax.  Musculoskeletal: No chest wall abnormality. No acute or significant osseous findings. Anterior vertebral endplate spurring at multiple levels in the mid and lower thoracic spine.  Review of the MIP images confirms the above findings.  CTA ABDOMEN AND PELVIS FINDINGS  VASCULAR  Aorta: No aneurysm, dissection, or stenosis. Scattered calcified plaque in the infrarenal segment.  Celiac: Calcified ostial plaque resulting in mild short-segment stenosis, patent distally.  SMA: Patent without evidence of aneurysm, dissection, vasculitis or significant stenosis.  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  IMA: Diminutive, patent  Inflow: Scattered plaque in the common and internal iliac  arteries without stenosis. Mild tortuosity. No aneurysm.  Veins: There is abnormal early opacification of the right femoral vein seen at the lower margin of the scan in the proximal thigh, suggesting high-flow lesion or AV fistula more peripherally in the right lower extremity. Inflow artifact in the right iliac venous system and IVC.  Review of the MIP images confirms the above findings.  NON-VASCULAR  Hepatobiliary: Multiple enhancing poorly marginated lesions in the liver measured up to 1.3 cm diameter (image 82/4), which are inconspicuous on delayed scans through the liver. 4.1 cm cyst in segment 4B. 2.1 cm cyst in segment 2. 1 cm probable subcapsular cyst in segment 8. Gallbladder is unremarkable. No biliary ductal dilatation.  Pancreas: 0.8 cm hypervascular lesion in the pancreatic body image 94/4. No ductal dilatation. No adjacent inflammatory changes.  Spleen: Normal in size without focal abnormality.  Adrenals/Urinary Tract: Bilateral adrenal hyperplasia. Multiple left renal cysts, largest 1.8 cm in the lower pole. No hydronephrosis. 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 Urinary bladder is nondistended  Stomach/Bowel: Stomach and small bowel are nondilated. Normal appendix. Colon is  nondilated with a few scattered descending and sigmoid diverticula; no adjacent inflammatory/edematous change.  Lymphatic: No abdominal or pelvic adenopathy.  Reproductive: Uterus and bilateral adnexa are unremarkable.  Other: No ascites. No free air.  Musculoskeletal: Compression deformities of L2 and L3, seen on prior radiographs of 01/04/2013. Marked narrowing of the L4-5 interspace as before. Fixation hardware across the left femoral neck. No acute fracture or worrisome bone lesion.  Review of the MIP images confirms the above findings.  IMPRESSION: 1. Scattered aortoiliac arterial plaque without aneurysm or significant stenosis. There is  mild tortuosity of the iliac arterial systems. 2. 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 3. Abnormal early enhancement of the right femoral vein suggesting high-flow lesion or AV fistula more peripherally in the right lower extremity, below the level of the scan. 4. Enhancing subcentimeter pancreatic lesion and multiple hypervascular liver lesions suggesting possibility of metastatic disease. Consider MR abdomen with and without contrast for further characterization.  These results will be called to the ordering clinician or representative by the Radiologist Assistant, and communication documented in the PACS or zVision Dashboard.   Electronically Signed By: Lucrezia Europe M.D. On: 11/13/2017 11:59    Impression:  Patient has mitral valve prolapse with stage D severe symptomatic primary mitral regurgitation. She describes stable symptoms of exertional shortness of breath that occur only with more strenuous physical exertion such as walking up a flight of stairs or walking up an incline, consistent with chronic diastolic congestive heart failure [place] Heart Association functional class II. I have personally reviewed the patient's recent transthoracic and transesophageal echocardiograms. 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 There are no obvious ruptured primary chordae tendinaebut there is severe elongation and prolapse involving a portion of the middle scallop of the anterior leaflet. There is an eccentric jet of regurgitation that courses posteriorly around the dilated left atrium. There was flow reversal in the pulmonary veins. There is moderate left ventricular hypertrophy and mild left ventricular systolic dysfunction with ejection fraction estimated only 45-50% in the setting of severe mitral regurgitation. I agree the patient needs elective mitral  valve repair.  Diagnostic cardiac catheterization reveals normal coronary arteries with no significant coronary artery disease.  Right heart pressures were mildly elevated.  CT angiography revealed no contraindication to peripheral cannulation for surgery.  CT angiogram of the abdomen did reveal a small enhancing lesion in the pancreas and multiple hypervascular abnormalities of the liver.  MRI has been scheduled for later this week.    Plan:  The patient was again counseled at length regarding the indications, risks and potential benefits of mitral valve repair.  The rationale for elective surgery has been explained, including a comparison between surgery and continued medical therapy with close follow-up.  The likelihood of successful and durable valve repair has been discussed with particular reference to the findings of their recent echocardiogram.  Based upon these findings and previous experience, I have quoted them a greater than 90 percent likelihood of successful valve repair.  In the unlikely event that their valve cannot be successfully repaired, we discussed the possibility of replacing the mitral valve using a mechanical prosthesis with the attendant need for long-term anticoagulation versus the alternative of replacing it using a bioprosthetic tissue valve with its potential for late structural valve deterioration and failure, depending upon the patient's longevity.  The patient specifically requests that if the mitral valve must be replaced  that it be done using a bioprosthetic tissue valve.   The patient understands and accepts all potential risks of surgery including but not limited to risk of death, stroke or other neurologic complication, myocardial infarction, congestive heart failure, respiratory failure, renal failure, bleeding requiring transfusion and/or reexploration, arrhythmia, infection or other wound complications, pneumonia, pleural and/or pericardial effusion, pulmonary  embolus, aortic dissection or other major vascular complication, or delayed complications related to valve repair or replacement including but not limited to structural valve deterioration and failure, thrombosis, embolization, endocarditis, or paravalvular leak.  Alternative surgical approaches have been discussed including a comparison between conventional sternotomy and minimally-invasive techniques.  The relative risks and benefits of each have been reviewed as they pertain to the patient's specific circumstances, and all of their questions have been addressed.  Specific risks potentially related to the minimally-invasive approach were discussed at length, including but not limited to risk of conversion to full or partial sternotomy, aortic dissection or other major vascular complication, unilateral acute lung injury or pulmonary edema, phrenic nerve dysfunction or paralysis, rib fracture, chronic pain, lung hernia, or lymphocele.   At the time of surgery we will plan to close the patient's patent foramen ovale.  We will also plan to clip her left atrial appendage given her history of atrial flutter.  All of their questions have been answered.    We tentatively plan to proceed with surgery on November 24, 2017.  Patient has been instructed to stop taking Eliquis 7 days prior to surgery.    Valentina Gu. Roxy Manns, MD 11/16/2017 1:04 PM

## 2017-11-23 NOTE — Telephone Encounter (Signed)
Called patient to discuss results of MRI of abdomen performed this weekend which confirms the presence of multiple hypervascular lesions in the liver suspicious for possible metastatic disease.  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  I discussed options with the patient including proceeding with elective mitral valve repair at this time versus postponing surgery to allow further workup.  Under the circumstances I favor postponing surgery so as to minimize any potential delay in diagnosis of possible malignancy.  The patient and her family will come to our office tomorrow to discuss the results in person and to make plans for further diagnostic workup.  All questions answered.  Rexene Alberts, MD 11/23/2017 5:43 PM

## 2017-11-24 ENCOUNTER — Ambulatory Visit (INDEPENDENT_AMBULATORY_CARE_PROVIDER_SITE_OTHER): Payer: Medicare Other | Admitting: Thoracic Surgery (Cardiothoracic Vascular Surgery)

## 2017-11-24 ENCOUNTER — Other Ambulatory Visit: Payer: Self-pay | Admitting: *Deleted

## 2017-11-24 ENCOUNTER — Encounter (HOSPITAL_COMMUNITY): Admission: RE | Payer: Self-pay | Source: Ambulatory Visit

## 2017-11-24 ENCOUNTER — Inpatient Hospital Stay (HOSPITAL_COMMUNITY)
Admission: RE | Admit: 2017-11-24 | Payer: Medicare Other | Source: Ambulatory Visit | Admitting: Thoracic Surgery (Cardiothoracic Vascular Surgery)

## 2017-11-24 ENCOUNTER — Encounter: Payer: Self-pay | Admitting: Thoracic Surgery (Cardiothoracic Vascular Surgery)

## 2017-11-24 VITALS — BP 140/78 | HR 45 | Resp 20 | Ht 63.0 in | Wt 159.0 lb

## 2017-11-24 DIAGNOSIS — I080 Rheumatic disorders of both mitral and aortic valves: Secondary | ICD-10-CM

## 2017-11-24 DIAGNOSIS — K869 Disease of pancreas, unspecified: Secondary | ICD-10-CM

## 2017-11-24 DIAGNOSIS — I340 Nonrheumatic mitral (valve) insufficiency: Secondary | ICD-10-CM | POA: Diagnosis not present

## 2017-11-24 DIAGNOSIS — K8689 Other specified diseases of pancreas: Secondary | ICD-10-CM

## 2017-11-24 DIAGNOSIS — R160 Hepatomegaly, not elsewhere classified: Secondary | ICD-10-CM

## 2017-11-24 SURGERY — REPAIR, MITRAL VALVE, MINIMALLY INVASIVE
Anesthesia: General | Site: Chest | Laterality: Right

## 2017-11-24 NOTE — Patient Instructions (Signed)
Restart taking Eliquis and continue all other previous medications without any changes at this time

## 2017-11-24 NOTE — Progress Notes (Signed)
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  She was originally seen in consultation on November 02, 2017.  CT angiogram of the chest, abdomen, and pelvis performed November 13, 2017 revealed the incidental finding of a small (subcentimeter) pancreatic lesion and multiple hypervascular lesions in the liver.  She was last seen in our office on November 16, 2017 at which time we made tentative plans for surgery but scheduled a follow-up MRI of the abdomen to evaluate the pancreatic and liver lesions further.  MRI of the abdomen was performed November 21, 2017 and the patient returned to the office today to review the results and discuss treatment options further.  She reports no new problems or complaints since her last office visit.  The patient specifically denies any symptoms of low-grade fever, malaise, anorexia, or weight loss.  She reports normal appetite.  She states that she does experience intermittent urgency with defecation that usually occurs immediately following a meal.  This is sometimes associated with loose bowel movements or diarrhea, although sometimes she states that her stools are normal.  She does not experience abdominal pain or cramping.   Current Outpatient Medications  Medication Sig Dispense Refill  . acetaminophen (TYLENOL) 500 MG tablet Take 1,000 mg by mouth every 6 (six) hours as needed for moderate pain or headache.    Marland Kitchen amLODipine (NORVASC) 10 MG tablet Take 1 tablet (10 mg total) by mouth at bedtime. (NEEDS TO BE SEEN FOR REFILLS) 90 tablet 1  . atorvastatin (LIPITOR) 40 MG tablet  Take 1 tablet (40 mg total) by mouth at bedtime. 90 tablet 1  . hydrochlorothiazide (HYDRODIURIL) 25 MG tablet Take 1 tablet (25 mg total) by mouth daily. 90 tablet 1  . IRON PO Take 1 tablet by mouth daily as needed (If iron is low before donating blood will take tablets).     Marland Kitchen spironolactone (ALDACTONE) 25 MG tablet Take 1 tablet (25 mg total) by mouth at bedtime as needed. (Patient taking differently: Take 25 mg by mouth at bedtime. ) 90 tablet 1  . apixaban (ELIQUIS) 5 MG TABS tablet Take 1 tablet (5 mg total) by mouth 2 (two) times daily. (Patient not taking: Reported on 11/24/2017) 180 tablet 1   No current facility-administered medications for this visit.       Physical Exam:   BP 140/78   Pulse (!) 45   Resp 20   Ht 5\' 3"  (1.6 m)   Wt 159 lb (72.1 kg)   LMP 11/05/1992   SpO2 96% Comment: RA  BMI 28.17 kg/m   General:  Well-appearing  Chest:   Clear to auscultation  CV:   Regular rate and rhythm with holosystolic murmur  Incisions:  n/a  Abdomen:  Soft nontender  Extremities:  Warm and well perfused  Diagnostic Tests:  MRI ABDOMEN WITHOUT AND WITH CONTRAST  TECHNIQUE: Multiplanar multisequence MR imaging of the abdomen was performed both before and after the administration of intravenous contrast.  CONTRAST:  36mL MULTIHANCE GADOBENATE DIMEGLUMINE 529 MG/ML IV SOLN  COMPARISON:  CTA abdomen/pelvis dated 11/13/2017  FINDINGS: Lower chest: Lung bases are clear.  Cardiomegaly.  Hepatobiliary: Multiple hepatic cysts, measuring up to 3.8 cm in segment 4B (series 10/image 22).  Scattered hypervascular lesions in both lobes, approximately 10-15 in number, measuring up to 13 mm in segment 5 (series 12/image 26). These are only evident on the early arterial phase.  Gallbladder is unremarkable. No intrahepatic or extrahepatic ductal dilatation.  Pancreas: Hypervascular lesion along the anterior aspect of the pancreatic tail noted on CT is not evident on  the current MR. However, there is corresponding diffusion abnormality (series 7/image 16). No pancreatic ductal dilatation or atrophy.  Spleen:  Within normal limits.  Adrenals/Urinary Tract:  Adrenal glands are within normal limits.  Left renal cysts measuring up to 1.9 cm (series 5/image 21). No hydronephrosis.  Right pelvic kidney with subcentimeter cysts, incompletely visualized. No hydronephrosis.  Stomach/Bowel: Stomach and visualized bowel are grossly unremarkable.  Vascular/Lymphatic:  No evidence of abdominal aortic aneurysm.  No suspicious abdominal lymphadenopathy.  Other:  No abdominal ascites.  Musculoskeletal: No focal osseous lesions.  IMPRESSION: Hypervascular lesion along the anterior aspect of the pancreatic tail is better visualized on prior CT. However, this remains worrisome for pancreatic neoplasm such as neuroendocrine tumor.  Scattered hypervascular lesions in the liver, approximately 10-15 in number, measuring up to 13 mm in segment 5. These are worrisome for hepatic metastases.  Additional ancillary findings as above.   Electronically Signed   By: Julian Hy M.D.   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   Plan:  I have discussed the implications of the results of the patient's recent CT and MRI with the patient and her daughter in the office today.  Her son listened and via telephone.  We discussed the fact that many neuroendocrine tumors are relatively slow growing and benign acting, but some can behave more aggressively.  We discussed the need for further diagnostic testing and possible biopsy to obtain  definitive diagnosis.  We discussed whether or not to proceed with mitral valve repair at this juncture or delay for a period of time to facilitate definitive diagnosis.  Under the circumstances I favor delaying plans for elective mitral valve surgery until definitive diagnosis and long-term treatment plans can be established.  The patient will undergo gadolinium dotatide PET-CT scan as soon as possible.  She will be referred to Dr. Clarene Essex for consultation once the PET/CT scan has been performed.  We will plan to have her return to our office in approximately 2 weeks to review the results of her scan and potentially reschedule elective mitral valve repair at that time.  In the meanwhile the patient will resume taking all of her previous medications including Eliquis.  All questions answered.  I spent in excess of 15 minutes during the conduct of this office consultation and >50% of this time involved direct face-to-face encounter with the patient for counseling and/or coordination of their care.    Valentina Gu. Roxy Manns, MD 11/24/2017 11:15 AM

## 2017-11-25 ENCOUNTER — Other Ambulatory Visit: Payer: Self-pay | Admitting: Thoracic Surgery (Cardiothoracic Vascular Surgery)

## 2017-11-25 ENCOUNTER — Encounter (HOSPITAL_COMMUNITY): Admission: RE | Admit: 2017-11-25 | Payer: Medicare Other | Source: Ambulatory Visit

## 2017-11-25 ENCOUNTER — Other Ambulatory Visit: Payer: Self-pay | Admitting: *Deleted

## 2017-11-25 DIAGNOSIS — D3A8 Other benign neuroendocrine tumors: Secondary | ICD-10-CM

## 2017-11-26 ENCOUNTER — Telehealth: Payer: Self-pay | Admitting: Cardiology

## 2017-11-26 NOTE — Telephone Encounter (Signed)
Spoke with pt, samples of eliquis placed at the front desk for patient pick up.

## 2017-11-26 NOTE — Telephone Encounter (Signed)
New Message  Pt c/o medication issue:  1. Name of Medication: apixaban (ELIQUIS) 5 MG TABS tablet  2. How are you currently taking this medication (dosage and times per day)? Take 1 tablet (5 mg total) by mouth 2 (two) times daily. Patient not taking: Reported on 11/24/2017  3. Are you having a reaction (difficulty breathing--STAT)? no  4. What is your medication issue? Pt states that she was suppose to have a procedure with Dr. Ricard Dillon and after she was going to be put on Coumadin, but the procedure was postponed and now she is reaching out trying to get  assistance due to the Eliquis being so costly. Please call

## 2017-11-30 ENCOUNTER — Other Ambulatory Visit (HOSPITAL_COMMUNITY): Payer: Medicare Other

## 2017-11-30 ENCOUNTER — Encounter (HOSPITAL_COMMUNITY)
Admission: RE | Admit: 2017-11-30 | Discharge: 2017-11-30 | Disposition: A | Discharge status: Home / Self Care | Payer: Medicare Other | Source: Ambulatory Visit | Attending: Thoracic Surgery (Cardiothoracic Vascular Surgery) | Admitting: Thoracic Surgery (Cardiothoracic Vascular Surgery)

## 2017-11-30 ENCOUNTER — Other Ambulatory Visit: Payer: Self-pay | Admitting: *Deleted

## 2017-11-30 DIAGNOSIS — C7A1 Malignant poorly differentiated neuroendocrine tumors: Secondary | ICD-10-CM | POA: Diagnosis not present

## 2017-11-30 DIAGNOSIS — D3A8 Other benign neuroendocrine tumors: Secondary | ICD-10-CM | POA: Insufficient documentation

## 2017-11-30 DIAGNOSIS — I483 Typical atrial flutter: Secondary | ICD-10-CM

## 2017-11-30 MED ORDER — GALLIUM GA 68 DOTATATE IV KIT
4.3000 | PACK | Freq: Once | INTRAVENOUS | Status: AC
  Administered 2017-11-30: 4.3 via INTRAVENOUS

## 2017-11-30 MED ORDER — APIXABAN 5 MG PO TABS: 5.0000 mg | ORAL_TABLET | Freq: Two times a day (BID) | ORAL | 3 refills | Status: DC

## 2017-11-30 NOTE — Telephone Encounter (Signed)
Eliquis rx printed for Dr Percival Spanish to signed

## 2017-12-02 DIAGNOSIS — R932 Abnormal findings on diagnostic imaging of liver and biliary tract: Secondary | ICD-10-CM | POA: Diagnosis not present

## 2017-12-02 DIAGNOSIS — R933 Abnormal findings on diagnostic imaging of other parts of digestive tract: Secondary | ICD-10-CM | POA: Diagnosis not present

## 2017-12-03 ENCOUNTER — Telehealth: Payer: Self-pay | Admitting: Oncology

## 2017-12-03 ENCOUNTER — Encounter: Payer: Self-pay | Admitting: Oncology

## 2017-12-03 NOTE — Telephone Encounter (Signed)
Pt has been scheduled to see Dr. Benay Spice on 5/2 at [facility] the pt a vm to call back. Letter mailed.

## 2017-12-09 ENCOUNTER — Telehealth: Payer: Self-pay

## 2017-12-09 NOTE — Telephone Encounter (Signed)
Called and left multiple messages for Brooke Chavez requesting to move initial med/onc consult from 5/2 to 12/09/17. Brooke Chavez called back and stated that she did not want to come in to see an oncologist until after she had heart surgery. I explained the importance of speaking with an oncologist to review her PET and MRI scans. Brooke Chavez was adamant that Dr. [name] had told her that it wasn't urgent. Brooke Chavez asked that her consult be cancelled and that she would call us back when she was ready. I will message Dr. [name] with this information.

## 2017-12-10 ENCOUNTER — Inpatient Hospital Stay: Payer: Medicare Other | Admitting: Oncology

## 2017-12-14 ENCOUNTER — Encounter: Payer: Medicare Other | Admitting: Thoracic Surgery (Cardiothoracic Vascular Surgery)

## 2017-12-18 ENCOUNTER — Other Ambulatory Visit: Payer: Self-pay

## 2017-12-18 ENCOUNTER — Encounter: Payer: Self-pay | Admitting: Thoracic Surgery (Cardiothoracic Vascular Surgery)

## 2017-12-18 ENCOUNTER — Ambulatory Visit (INDEPENDENT_AMBULATORY_CARE_PROVIDER_SITE_OTHER): Payer: Medicare Other | Admitting: Thoracic Surgery (Cardiothoracic Vascular Surgery)

## 2017-12-18 VITALS — BP 159/74 | HR 48 | Resp 18 | Ht 63.0 in | Wt 162.0 lb

## 2017-12-18 DIAGNOSIS — I080 Rheumatic disorders of both mitral and aortic valves: Secondary | ICD-10-CM | POA: Diagnosis not present

## 2017-12-18 DIAGNOSIS — I340 Nonrheumatic mitral (valve) insufficiency: Secondary | ICD-10-CM

## 2017-12-18 NOTE — Progress Notes (Signed)
[address]       [place], [address]             [phone]     CARDIOTHORACIC SURGERY OFFICE NOTE  Referring Provider is [name], MD PCP is [name], FNP   HPI:  Patient is a 77 year old African-American female who returns the office today for follow-up of severe symptomatic primary mitral regurgitation. She was originally seen in consultation on November 02, 2017 and plans were made for elective minimally invasive mitral valve repair.  CT angiogram of the chest, abdomen, and pelvis performed November 13, 2017 revealed the incidental finding of a small (subcentimeter) pancreatic lesion and multiple hypervascular lesions in the liver.    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  The patient subsequently underwent DOTATATE PET imaging and was referred to Dr. [name] for consultation.  PET scan revealed intense radiotracer uptake in the tail of the pancreas consistent with primary neuroendocrine neoplasm.  There was no significant radiotracer uptake in the liver to suggest the presence of metastatic disease.  The patient was seen in follow-up by Dr. 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  The patient states that she remains clinically stable.  She describes stable symptoms of exertional shortness of breath that occur only with more strenuous physical exertion.  She denies any resting shortness of breath, PND, orthopnea, or lower extremity edema.  She has had occasional brief palpitations and occasional slight dizzy spells.  She does not experience any chest pain or abdominal pain.  She reports occasional diarrhea.   Current Outpatient Medications  Medication Sig Dispense Refill  . acetaminophen  (TYLENOL) 500 MG tablet Take 1,000 mg by mouth every 6 (six) hours as needed for moderate pain or headache.    Marland Kitchen amLODipine (NORVASC) 10 MG tablet Take 1 tablet (10 mg total) by mouth at bedtime. (NEEDS TO BE SEEN FOR REFILLS) 90 tablet 1  . apixaban (ELIQUIS) 5 MG TABS tablet Take 1 tablet (5 mg total) by mouth 2 (two) times daily. 180 tablet 3  . atorvastatin (LIPITOR) 40 MG tablet Take 1 tablet (40 mg total) by mouth at bedtime. 90 tablet 1  . hydrochlorothiazide (HYDRODIURIL) 25 MG tablet Take 1 tablet (25 mg total) by mouth daily. 90 tablet 1  . IRON PO Take 1 tablet by mouth daily as needed (If iron is low before donating blood will take tablets).     Marland Kitchen spironolactone (ALDACTONE) 25 MG tablet Take 1 tablet (25 mg total) by mouth at bedtime as needed. (Patient taking differently: Take 25 mg by mouth at bedtime. ) 90 tablet 1   No current facility-administered medications for this visit.       Physical Exam:   BP (!) 159/74 (BP Location: Left Arm, Patient Position: Sitting, Cuff Size: Normal)   Pulse (!) 48   Resp 18   Ht 5\' 3"  (1.6 m)   Wt 162 lb (73.5 kg)   LMP 11/05/1992   SpO2 98% Comment: RA  BMI 28.70 kg/m   General:  Well-appearing  Chest:   Clear to auscultation  CV:   Regular rate and rhythm with holosystolic murmur  Incisions:  n/a  Abdomen:  Soft nontender  Extremities:  Warm and well-perfused  Diagnostic Tests:  NUCLEAR MEDICINE PET SKULL BASE TO THIGH  TECHNIQUE: 4.3 mCi Ga 58 DOTATATE was injected intravenously. Full-ring PET imaging was performed from the skull base to thigh after the radiotracer. CT data was obtained and used for attenuation correction and anatomic localization.  COMPARISON:  CT 11/13/2017 and MRI 11/21/2017  FINDINGS: NECK  No radiotracer activity in neck lymph nodes.  Incidental CT findings: None  CHEST  No radiotracer accumulation within mediastinal or hilar lymph nodes. No suspicious pulmonary nodules on the CT  scan.  Incidental CT finding:Increase caliber of the main pulmonary artery which measures 3.3 cm. Moderate cardiac enlargement.  ABDOMEN/PELVIS  No significant radiotracer uptake within the liver above background activity to correspond with the hypervascular liver lesions suspicious for metastasis. Lesion within the tail of pancreas however exhibits intense FDG uptake within SUV max of 63.8.  Physiologic activity noted in the liver, spleen, adrenal glands and kidneys.  Incidental CT findings:Aortic atherosclerosis. No aneurysm. 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  Incidental CT findings:None  IMPRESSION: 1. Intense radiotracer uptake localizes to the tail of pancreas lesion compatible with primary neuroendocrine neoplasm. 2. No significant radiotracer uptake within the liver parenchyma above background liver activity to correspond with the hypervascular liver lesions noted on the comparison CT and MRI.   Electronically Signed   By: Kerby Moors M.D.   On: 11/30/2017 14:15   Impression:  Patient has mitral valve prolapse with stage D severe symptomatic primary mitral regurgitation. She describes stable symptoms of exertional shortness of breath that occur only with more strenuous physical exertion such as walking up a flight of stairs or walking up an incline, consistent with chronic diastolic congestive heart failure New York Heart Association functional class II. I have personally reviewed the patient's recent transthoracic and transesophageal echocardiograms. 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 There are no obvious ruptured primary chordae tendinaebut there is severe elongation and prolapse involving a portion of the middle scallop of the anterior leaflet. There is an eccentric jet of regurgitation that courses posteriorly  around the dilated left atrium. There was flow reversal in the pulmonary veins. There is moderate left ventricular hypertrophy and mild left ventricular systolic dysfunction with ejection fraction estimated only 45-50% in the setting of severe mitral regurgitation. I agree the patient needs elective mitral valve repair.  Diagnostic cardiac catheterization reveals normal coronary arteries with no significant coronary artery disease.  Right heart pressures were mildly elevated.  CT angiography revealed no contraindication to peripheral cannulation for surgery.    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  There is no sign of metastatic disease.   Plan:  The patient was again counseled at length regarding the indications, risks and potential benefits of mitral valve repair.  The rationale for elective surgery has been explained, including a comparison between surgery and continued medical therapy with close follow-up.  The likelihood of successful and durable valve repair has been discussed with particular reference to the findings of their recent echocardiogram.  Based upon these findings and previous experience, I have quoted them a greater than 90 percent likelihood of successful valve repair.  In the unlikely event that their valve cannot be successfully repaired, we discussed the possibility of replacing the mitral valve using a mechanical prosthesis with the attendant need for  long-term anticoagulation versus the alternative of replacing it using a bioprosthetic tissue valve with its potential for late structural valve deterioration and failure, depending upon the patient's longevity.  The patient specifically requests that if the mitral valve must be replaced that it be done using a bioprosthetic tissue valve.   The patient understands and accepts all potential risks of surgery including but  not limited to risk of death, stroke or other neurologic complication, myocardial infarction, congestive heart failure, respiratory failure, renal failure, bleeding requiring transfusion and/or reexploration, arrhythmia, infection or other wound complications, pneumonia, pleural and/or pericardial effusion, pulmonary embolus, aortic dissection or other major vascular complication, or delayed complications related to valve repair or replacement including but not limited to structural valve deterioration and failure, thrombosis, embolization, endocarditis, or paravalvular leak.  Alternative surgical approaches have been discussed including a comparison between conventional sternotomy and minimally-invasive techniques.  The relative risks and benefits of each have been reviewed as they pertain to the patient's specific circumstances, and all of their questions have been addressed.  Specific risks potentially related to the minimally-invasive approach were discussed at length, including but not limited to risk of conversion to full or partial sternotomy, aortic dissection or other major vascular complication, unilateral acute lung injury or pulmonary edema, phrenic nerve dysfunction or paralysis, rib fracture, chronic pain, lung hernia, or lymphocele.   At the time of surgery we will plan to close the patient's patent foramen ovale.  We will also plan to clip her left atrial appendage given her history of atrial flutter.  All of her questions have been answered.    We tentatively plan to proceed with surgery on Jan 07, 2018.  Patient has been instructed to stop taking Eliquis 7 days prior to surgery.  I spent in excess of 15 minutes during the conduct of this office consultation and >50% of this time involved direct face-to-face encounter with the patient for counseling and/or coordination of their care.    Valentina Gu. Roxy Manns, MD 12/18/2017 4:35 PM

## 2017-12-18 NOTE — Patient Instructions (Signed)
Stop taking Eliquis 1 week prior to surgery (May 23rd)  Continue taking all other medications without change through the day before surgery.  Have nothing to eat or drink after midnight the night before surgery.  On the morning of surgery do not take any of your regularly scheduled medications

## 2017-12-21 ENCOUNTER — Other Ambulatory Visit: Payer: Self-pay | Admitting: *Deleted

## 2017-12-21 DIAGNOSIS — I340 Nonrheumatic mitral (valve) insufficiency: Secondary | ICD-10-CM

## 2018-01-05 ENCOUNTER — Other Ambulatory Visit: Payer: Self-pay

## 2018-01-05 ENCOUNTER — Encounter (HOSPITAL_COMMUNITY): Payer: Self-pay

## 2018-01-05 ENCOUNTER — Encounter (HOSPITAL_COMMUNITY)
Admission: RE | Admit: 2018-01-05 | Discharge: 2018-01-05 | Disposition: A | Discharge status: Home / Self Care | Payer: Medicare Other | Source: Ambulatory Visit | Attending: Thoracic Surgery (Cardiothoracic Vascular Surgery) | Admitting: Thoracic Surgery (Cardiothoracic Vascular Surgery)

## 2018-01-05 ENCOUNTER — Ambulatory Visit (HOSPITAL_COMMUNITY)
Admission: RE | Admit: 2018-01-05 | Discharge: 2018-01-05 | Disposition: A | Discharge status: Home / Self Care | Payer: Medicare Other | Source: Ambulatory Visit | Attending: Thoracic Surgery (Cardiothoracic Vascular Surgery) | Admitting: Thoracic Surgery (Cardiothoracic Vascular Surgery)

## 2018-01-05 DIAGNOSIS — I5033 Acute on chronic diastolic (congestive) heart failure: Secondary | ICD-10-CM | POA: Diagnosis not present

## 2018-01-05 DIAGNOSIS — I4589 Other specified conduction disorders: Secondary | ICD-10-CM | POA: Insufficient documentation

## 2018-01-05 DIAGNOSIS — J9383 Other pneumothorax: Secondary | ICD-10-CM | POA: Diagnosis not present

## 2018-01-05 DIAGNOSIS — Z6830 Body mass index (BMI) 30.0-30.9, adult: Secondary | ICD-10-CM | POA: Diagnosis not present

## 2018-01-05 DIAGNOSIS — I4892 Unspecified atrial flutter: Secondary | ICD-10-CM | POA: Insufficient documentation

## 2018-01-05 DIAGNOSIS — I9789 Other postprocedural complications and disorders of the circulatory system, not elsewhere classified: Secondary | ICD-10-CM | POA: Diagnosis not present

## 2018-01-05 DIAGNOSIS — Z01810 Encounter for preprocedural cardiovascular examination: Secondary | ICD-10-CM | POA: Insufficient documentation

## 2018-01-05 DIAGNOSIS — R001 Bradycardia, unspecified: Secondary | ICD-10-CM | POA: Diagnosis not present

## 2018-01-05 DIAGNOSIS — I4891 Unspecified atrial fibrillation: Secondary | ICD-10-CM | POA: Insufficient documentation

## 2018-01-05 DIAGNOSIS — I444 Left anterior fascicular block: Secondary | ICD-10-CM

## 2018-01-05 DIAGNOSIS — Z01812 Encounter for preprocedural laboratory examination: Secondary | ICD-10-CM

## 2018-01-05 DIAGNOSIS — I517 Cardiomegaly: Secondary | ICD-10-CM

## 2018-01-05 DIAGNOSIS — M1990 Unspecified osteoarthritis, unspecified site: Secondary | ICD-10-CM | POA: Diagnosis not present

## 2018-01-05 DIAGNOSIS — I340 Nonrheumatic mitral (valve) insufficiency: Secondary | ICD-10-CM

## 2018-01-05 DIAGNOSIS — J9811 Atelectasis: Secondary | ICD-10-CM | POA: Diagnosis not present

## 2018-01-05 DIAGNOSIS — D62 Acute posthemorrhagic anemia: Secondary | ICD-10-CM | POA: Diagnosis not present

## 2018-01-05 DIAGNOSIS — E785 Hyperlipidemia, unspecified: Secondary | ICD-10-CM | POA: Diagnosis not present

## 2018-01-05 DIAGNOSIS — Z01818 Encounter for other preprocedural examination: Secondary | ICD-10-CM | POA: Insufficient documentation

## 2018-01-05 DIAGNOSIS — Q211 Atrial septal defect: Secondary | ICD-10-CM | POA: Diagnosis not present

## 2018-01-05 DIAGNOSIS — I97190 Other postprocedural cardiac functional disturbances following cardiac surgery: Secondary | ICD-10-CM | POA: Diagnosis not present

## 2018-01-05 LAB — COMPREHENSIVE METABOLIC PANEL
ALT: 13 U/L — ABNORMAL LOW (ref 14–54)
AST: 23 U/L (ref 15–41)
Albumin: 4 g/dL (ref 3.5–5.0)
Alkaline Phosphatase: 56 U/L (ref 38–126)
Anion gap: 9 (ref 5–15)
BUN: 23 mg/dL — ABNORMAL HIGH (ref 6–20)
CO2: 20 mmol/L — ABNORMAL LOW (ref 22–32)
Calcium: 9.5 mg/dL (ref 8.9–10.3)
Chloride: 108 mmol/L (ref 101–111)
Creatinine, Ser: 1.61 mg/dL — ABNORMAL HIGH (ref 0.44–1.00)
GFR calc Af Amer: 34 mL/min — ABNORMAL LOW (ref >60)
GFR calc non Af Amer: 30 mL/min — ABNORMAL LOW (ref >60)
Glucose, Bld: 94 mg/dL (ref 65–99)
Potassium: 4.4 mmol/L (ref 3.5–5.1)
Sodium: 137 mmol/L (ref 135–145)
Total Bilirubin: 0.9 mg/dL (ref 0.3–1.2)
Total Protein: 7.6 g/dL (ref 6.5–8.1)

## 2018-01-05 LAB — BLOOD GAS, ARTERIAL
Acid-base deficit: 2.8 mmol/L — ABNORMAL HIGH (ref 0.0–2.0)
Bicarbonate: 20.9 mmol/L (ref 20.0–28.0)
Drawn by: 449841
FIO2: 21
O2 Saturation: 97.5 %
Patient temperature: 98.6
pCO2 arterial: 32.8 mmHg (ref 32.0–48.0)
pH, Arterial: 7.421 (ref 7.350–7.450)
pO2, Arterial: 100 mmHg (ref 83.0–108.0)

## 2018-01-05 LAB — CBC
HCT: 38 % (ref 36.0–46.0)
Hemoglobin: 12.6 g/dL (ref 12.0–15.0)
MCH: 31.7 pg (ref 26.0–34.0)
MCHC: 33.2 g/dL (ref 30.0–36.0)
MCV: 95.5 fL (ref 78.0–100.0)
Platelets: 242 10*3/uL (ref 150–400)
RBC: 3.98 MIL/uL (ref 3.87–5.11)
RDW: 13.2 % (ref 11.5–15.5)
WBC: 6.6 10*3/uL (ref 4.0–10.5)

## 2018-01-05 LAB — URINALYSIS, ROUTINE W REFLEX MICROSCOPIC
Bilirubin Urine: NEGATIVE
Glucose, UA: NEGATIVE mg/dL
Hgb urine dipstick: NEGATIVE
Ketones, ur: NEGATIVE mg/dL
Leukocytes, UA: NEGATIVE
Nitrite: NEGATIVE
Protein, ur: NEGATIVE mg/dL
Specific Gravity, Urine: 1.014 (ref 1.005–1.030)
pH: 5 (ref 5.0–8.0)

## 2018-01-05 LAB — PROTIME-INR
INR: 1
Prothrombin Time: 13 seconds (ref 11.4–15.2)

## 2018-01-05 LAB — SURGICAL PCR SCREEN
MRSA, PCR: NEGATIVE
Staphylococcus aureus: NEGATIVE

## 2018-01-05 LAB — HEMOGLOBIN A1C
Hgb A1c MFr Bld: 5.3 % (ref 4.8–5.6)
Mean Plasma Glucose: 105.41 mg/dL

## 2018-01-05 LAB — APTT: aPTT: 28 seconds (ref 24–36)

## 2018-01-05 NOTE — Progress Notes (Signed)
PCP:  Mary-Margaret Hassell Done  Cardiologist: [name]   EKG: 01/05/18 per order  Stress test:  ECHO: 09/18/17 in [facility]  Cardiac Cath: 11/21/17 in [facility]  Chest x-ray: 01/05/18 per order

## 2018-01-05 NOTE — Pre-Procedure Instructions (Signed)
Haniah BRADYN VASSEY  01/05/2018      [facility] [address], [place] [address] [name] [address] Phone: [phone] Fax: [phone]    Your procedure is scheduled on Jan 07, 2018.  Report to [facility] Admitting at 530 AM.  Call this number if you have problems the morning of surgery:  [phone]   Remember:  No food or drink after midnight.  Continue all medications as directed by your physician except follow these medication instructions before surgery below   Take these medicines the morning of surgery with A SIP OF WATER  Tylenol-if needed Amlodipine (norvasc)  Follow your surgeon's instructions on when to hold/resume eliquis (begin holding 12/31/17)  7 days prior to surgery STOP taking any Aspirin (unless otherwise instructed by your surgeon), Aleve, Naproxen, Ibuprofen, Motrin, Advil, Goody's, BC's, all herbal medications, fish oil, and all vitamins    Do not wear jewelry, make-up or nail polish.  Do not wear lotions, powders, or perfumes, or deodorant.  Do not shave 48 hours prior to surgery.    Do not bring valuables to the hospital.  [facility] is not responsible for any belongings or valuables.  Contacts, dentures or bridgework may not be worn into surgery.  Leave your suitcase in the car.  After surgery it may be brought to your room.  For patients admitted to the hospital, discharge time will be determined by your treatment team.  Patients discharged the day of surgery will not be allowed to drive home.    [facility]- Preparing For Surgery  Before surgery, you can play an important role. Because skin is not sterile, your skin needs to be as free of germs as possible. You can reduce the number of germs on your skin by washing with CHG (chlorahexidine gluconate) Soap before surgery.  CHG is an antiseptic cleaner which kills germs and bonds with the skin to continue killing germs even after washing.    Oral  Hygiene is also important to reduce your risk of infection.  Remember - BRUSH YOUR TEETH THE MORNING OF SURGERY WITH YOUR REGULAR TOOTHPASTE  Please do not use if you have an allergy to CHG or antibacterial soaps. If your skin becomes reddened/irritated stop using the CHG.  Do not shave (including legs and underarms) for at least 48 hours prior to first CHG shower. It is OK to shave your face.  Please follow these instructions carefully.   1. Shower the NIGHT BEFORE SURGERY and the MORNING OF SURGERY with CHG.   2. If you chose to wash your hair, wash your hair first as usual with your normal shampoo.  3. After you shampoo, rinse your hair and body thoroughly to remove the shampoo.  4. Use CHG as you would any other liquid soap. You can apply CHG directly to the skin and wash gently with a scrungie or a clean washcloth.   5. Apply the CHG Soap to your body ONLY FROM THE NECK DOWN.  Do not use on open wounds or open sores. Avoid contact with your eyes, ears, mouth and genitals (private parts). Wash Face and genitals (private parts)  with your normal soap.  6. Wash thoroughly, paying special attention to the area where your surgery will be performed.  7. Thoroughly rinse your body with warm water from the neck down.  8. DO NOT shower/wash with your normal soap after using and rinsing off the CHG Soap.  9.  Pat yourself dry with a CLEAN TOWEL.  10. Wear CLEAN PAJAMAS to bed the night before surgery, wear comfortable clothes the morning of surgery  11. Place CLEAN SHEETS on your bed the night of your first shower and DO NOT SLEEP WITH PETS.  Day of Surgery:  Do not apply any deodorants/lotions.  Please wear clean clothes to the hospital/surgery center.   Remember to brush your teeth WITH YOUR REGULAR TOOTHPASTE.  Please read over the following fact sheets that you were given. Pain Booklet, Coughing and Deep Breathing, MRSA Information and Surgical Site Infection Prevention

## 2018-01-06 ENCOUNTER — Encounter (HOSPITAL_COMMUNITY): Payer: Self-pay | Admitting: Thoracic Surgery (Cardiothoracic Vascular Surgery)

## 2018-01-06 DIAGNOSIS — I5032 Chronic diastolic (congestive) heart failure: Secondary | ICD-10-CM | POA: Diagnosis present

## 2018-01-06 MED ORDER — VANCOMYCIN HCL 10 G IV SOLR
1250.0000 mg | INTRAVENOUS | Status: AC
  Administered 2018-01-07: 1250 mg via INTRAVENOUS
  Filled 2018-01-06: qty 1250

## 2018-01-06 MED ORDER — GLUTARALDEHYDE 0.625% SOAKING SOLUTION
TOPICAL | Status: DC
  Filled 2018-01-06: qty 50

## 2018-01-06 MED ORDER — SODIUM CHLORIDE 0.9 % IV SOLN
1.5000 g | INTRAVENOUS | Status: AC
  Administered 2018-01-07: 1.5 g via INTRAVENOUS
  Administered 2018-01-07: .75 g via INTRAVENOUS
  Filled 2018-01-06: qty 1.5

## 2018-01-06 MED ORDER — KENNESTONE BLOOD CARDIOPLEGIA (KBC) MANNITOL SYRINGE (20%, 32ML)
32.0000 mL | Freq: Once | INTRAVENOUS | Status: DC
  Filled 2018-01-06: qty 32

## 2018-01-06 MED ORDER — NITROGLYCERIN IN D5W 200-5 MCG/ML-% IV SOLN
2.0000 ug/min | INTRAVENOUS | Status: DC
  Filled 2018-01-06: qty 250

## 2018-01-06 MED ORDER — KENNESTONE BLOOD CARDIOPLEGIA VIAL
13.0000 mL | Freq: Once | Status: DC
  Filled 2018-01-06: qty 13

## 2018-01-06 MED ORDER — DEXTROSE 5 % IV SOLN
0.0000 ug/min | INTRAVENOUS | Status: DC
  Filled 2018-01-06: qty 4

## 2018-01-06 MED ORDER — MILRINONE LACTATE IN DEXTROSE 20-5 MG/100ML-% IV SOLN
0.1250 ug/kg/min | INTRAVENOUS | Status: DC
Start: 2018-01-07 — End: 2018-01-07
  Filled 2018-01-06: qty 100

## 2018-01-06 MED ORDER — TRANEXAMIC ACID (OHS) BOLUS VIA INFUSION
15.0000 mg/kg | INTRAVENOUS | Status: AC
  Administered 2018-01-07: 1099.5 mg via INTRAVENOUS
  Filled 2018-01-06: qty 1100

## 2018-01-06 MED ORDER — VANCOMYCIN HCL 1000 MG IV SOLR
INTRAVENOUS | Status: AC
  Administered 2018-01-07: 1000 mL
  Filled 2018-01-06: qty 1000

## 2018-01-06 MED ORDER — TRANEXAMIC ACID (OHS) PUMP PRIME SOLUTION
2.0000 mg/kg | INTRAVENOUS | Status: DC
  Filled 2018-01-06: qty 1.47

## 2018-01-06 MED ORDER — SODIUM CHLORIDE 0.9 % IV SOLN
INTRAVENOUS | Status: DC
  Filled 2018-01-06: qty 30

## 2018-01-06 MED ORDER — DOPAMINE-DEXTROSE 3.2-5 MG/ML-% IV SOLN
0.0000 ug/kg/min | INTRAVENOUS | Status: DC
  Filled 2018-01-06: qty 250

## 2018-01-06 MED ORDER — DEXMEDETOMIDINE HCL IN NACL 400 MCG/100ML IV SOLN
0.1000 ug/kg/h | INTRAVENOUS | Status: AC
  Administered 2018-01-07: .3 ug/kg/h via INTRAVENOUS
  Filled 2018-01-06: qty 100

## 2018-01-06 MED ORDER — TRANEXAMIC ACID 1000 MG/10ML IV SOLN
1.5000 mg/kg/h | INTRAVENOUS | Status: AC
  Administered 2018-01-07: 1.5 mg/kg/h via INTRAVENOUS
  Filled 2018-01-06: qty 25

## 2018-01-06 MED ORDER — MAGNESIUM SULFATE 50 % IJ SOLN
40.0000 meq | INTRAMUSCULAR | Status: DC
  Filled 2018-01-06: qty 9.85

## 2018-01-06 MED ORDER — CEFUROXIME SODIUM 750 MG IJ SOLR
750.0000 mg | INTRAMUSCULAR | Status: DC
  Filled 2018-01-06: qty 750

## 2018-01-06 MED ORDER — SODIUM CHLORIDE 0.9 % IV SOLN
INTRAVENOUS | Status: AC
  Administered 2018-01-07: .8 [IU]/h via INTRAVENOUS
  Filled 2018-01-06: qty 1

## 2018-01-06 MED ORDER — PLASMA-LYTE 148 IV SOLN
INTRAVENOUS | Status: DC
  Filled 2018-01-06: qty 2.5

## 2018-01-06 MED ORDER — POTASSIUM CHLORIDE 2 MEQ/ML IV SOLN
80.0000 meq | INTRAVENOUS | Status: DC
  Filled 2018-01-06: qty 40

## 2018-01-06 MED ORDER — PHENYLEPHRINE HCL 10 MG/ML IJ SOLN
30.0000 ug/min | INTRAMUSCULAR | Status: AC
  Administered 2018-01-07: 15 ug/min via INTRAVENOUS
  Filled 2018-01-06: qty 20

## 2018-01-06 NOTE — H&P (Signed)
[address]       [place] [address]             [phone]          CARDIOTHORACIC SURGERY HISTORY AND PHYSICAL EXAM  Referring Provider is Brooke Breeding, MD PCP is Brooke Pretty, FNP      Chief Complaint  Patient presents with  . Mitral Regurgitation    New patient evaluation, TEE 09/18/2017    HPI:  Patient is a 77 year old African-American female with history of mitral regurgitation, atrial flutter status post ablation, hypertension, and degenerative joint disease who has been referred for surgical consultation to discuss treatment option for management of mitral valve prolapse with severe primary mitral regurgitation.  Patient's cardiac history dates back several years ago to 2011 when she was diagnosed with atrial flutter.  She underwent cardioversion and later underwent catheter-based ablation in 2012.  She has been followed regularly ever since by Dr. Percival Chavez.  Previous echocardiograms have demonstrated the presence of normal left ventricular systolic function with mitral valve prolapse and what was felt to be moderate mitral regurgitation.  Follow-up transthoracic echocardiogram performed June 12, 2017 revealed significant change in left ventricular systolic function with ejection fraction estimated at only 45-50%.  She subsequently underwent transesophageal echocardiogram on September 18, 2017 which revealed similar findings, with mild left ventricular systolic dysfunction and ejection fraction estimated 45-50%.  There was mitral valve prolapse with severe mitral regurgitation confirmed by presence of systolic flow reversal in the pulmonary veins.  There was severe left atrial enlargement.  There was a patent foramen ovale.  Right ventricular size and function was normal.  The right atrium was dilated.  There was only mild tricuspid regurgitation.  Cardiothoracic surgical consultation was requested.  The patient is widowed and lives  locally in [place] with one of her daughters and her grandson.  She has been retired since 2000, having previously worked in the [employer].  She has remained reasonably active throughout retirement and continues to work 2 or 3 days a week doing light housework.  She drives an automobile and remains functionally independent.  She describes stable symptoms of exertional shortness of breath.  She states that she gets short of breath when she walks up an incline or tries to go up a flight of stairs.  She denies shortness of breath with low level activity or at rest.  She has not had any PND, or orthopnea.  She reports occasional palpitation.  She has not had dizzy spells or syncope.  She has mild chronic lower extremity edema.    Patient is a 77 year old African-American female who returns the office today for follow-up of severe symptomatic primary mitral regurgitation. She was originally seen in consultation on November 02, 2017 and plans were made for elective minimally invasive mitral valve repair.CT angiogram of the chest, abdomen, and pelvis performed November 13, 2017 revealed the incidental finding of a small (subcentimeter) pancreatic lesion and multiple hypervascular lesions in the liver.   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  The patient subsequently underwent DOTATATE PET imaging and was referred to Dr. Watt Chavez for consultation.  PET scan revealed intense radiotracer uptake in the tail of the pancreas consistent with primary neuroendocrine neoplasm.  There was no significant radiotracer uptake in the liver to suggest the presence of metastatic disease.  The patient was seen in follow-up by Dr. 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  The patient states that she remains clinically stable.  She describes stable symptoms of exertional shortness of breath that occur only with more strenuous physical exertion.  She denies any resting shortness of breath, PND, orthopnea, or lower extremity edema.  She has had occasional brief palpitations and occasional slight dizzy spells.  She does not experience any chest pain or abdominal pain.  She reports occasional diarrhea.   Past Medical History:  Diagnosis Date  . Atrial flutter ([place])    Typical by EKG diagnosis 9/11 s/p CIT ablation 11/11  . Bradycardia   . DJD (degenerative joint disease)   . Dysrhythmia   . Femur fracture, left ([place]) 2013  . Heart murmur   . Hyperlipidemia    x5 years  . Hypertension    Since 1997  . Liver masses 11/13/2017   Multiple small nodules seen on CT and MRI  . Mitral regurgitation    severe  . 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 A FLUTTER ABLATION N/A 06/27/2011   Procedure: ABLATION A FLUTTER;  Surgeon: Brooke Grayer, MD;  Location: [facility] CATH LAB;  Service: Cardiovascular;  Laterality: N/A;  . ATRIAL ABLATION SURGERY  06/2010  . HIP SURGERY     Left (fracture) 3/13  . JOINT REPLACEMENT     both knees   . KNEE ARTHROSCOPY     Right  . LUMBAR SPINE SURGERY    . RIGHT/LEFT HEART CATH AND CORONARY ANGIOGRAPHY N/A 11/11/2017   Procedure: RIGHT/LEFT HEART CATH AND CORONARY ANGIOGRAPHY;  Surgeon: Brooke Mocha, MD;  Location: [facility] CV LAB;  Service: Cardiovascular;  Laterality: N/A;  . TEE WITHOUT CARDIOVERSION N/A 09/18/2017   Procedure: TRANSESOPHAGEAL ECHOCARDIOGRAM (TEE);  Surgeon: Brooke Latch, MD;  Location: [facility];  Service: Cardiovascular;  Laterality: N/A;  . TOTAL KNEE ARTHROPLASTY     Left  . TUBAL LIGATION      Family History  Problem Relation Age of Onset  . Hypertension Mother   . Cancer Sister         leukemia  . Diabetes Brother   . Stroke Sister   . Diabetes Brother   . Heart attack Brother   . Healthy Daughter   . Healthy Son   . Healthy Daughter     Social History Social History   Tobacco Use  . Smoking status: Never Smoker  . Smokeless tobacco: Never Used  Substance Use Topics  . Alcohol use: No  . Drug use: No    Prior to Admission medications   Medication Sig Start Date End Date Taking? Authorizing Provider  acetaminophen (TYLENOL) 500 MG tablet Take 1,000 mg by mouth every 6 (six) hours as needed for moderate pain or headache.   Yes [provider]  amLODipine (NORVASC) 10 MG tablet Take 1 tablet (10 mg total) by mouth at bedtime. (NEEDS TO BE SEEN FOR REFILLS) 11/03/17  Yes Hassell Done, Mary-Margaret, FNP  apixaban (ELIQUIS) 5 MG TABS tablet Take 1 tablet (5 mg total) by mouth 2 (two) times daily. 11/30/17  Yes Brooke Breeding, MD  atorvastatin (LIPITOR) 40 MG tablet Take 1 tablet (40 mg total) by mouth at bedtime. 11/03/17  Yes Hassell Done, Mary-Margaret, FNP  losartan-hydrochlorothiazide (HYZAAR) 50-12.5 MG  tablet Take 1 tablet by mouth at bedtime.  12/18/17  Yes [provider]  spironolactone (ALDACTONE) 25 MG tablet Take 1 tablet (25 mg total) by mouth at bedtime as needed. Patient taking differently: Take 25 mg by mouth at bedtime.  11/03/17  Yes Martin, Mary-Margaret, FNP  hydrochlorothiazide (HYDRODIURIL) 25 MG tablet Take 1 tablet (25 mg total) by mouth daily. Patient not taking: Reported on 12/25/2017 11/03/17   Brooke Pretty, FNP  famotidine (PEPCID) 20 MG tablet Take 1 tablet (20 mg total) by mouth 2 (two) times daily. 07/29/11 10/29/11  Richardson Dopp T, PA-C    Allergies  Allergen Reactions  . Aspirin Nausea And Vomiting  . Oxycodone-Acetaminophen Anxiety and Other (See Comments)    Hallucinations      Review of Systems:              General:                      decreased appetite, normal energy, no weight gain, slight weight loss, no  fever             Cardiac:                       no chest pain with exertion, no chest pain at rest, + SOB with exertion, no resting SOB, no PND, no orthopnea, + palpitations, + arrhythmia, no atrial fibrillation, + LE edema, no dizzy spells, no syncope             Respiratory:                 Mild exertional shortness of breath, no home oxygen, no productive cough, occasional dry cough, no bronchitis, no wheezing, no hemoptysis, no asthma, no pain with inspiration or cough, no sleep apnea, no CPAP at night             GI:                               no difficulty swallowing, no reflux, no frequent heartburn, no hiatal hernia, no abdominal pain, no constipation, + occasional diarrhea, no hematochezia, no hematemesis, no melena             GU:                              no dysuria,  no frequency, no urinary tract infection, no hematuria, no kidney stones, no kidney disease             Vascular:                     no pain suggestive of claudication, no pain in feet, + frequent leg cramps, no varicose veins, no DVT, no non-healing foot ulcer             Neuro:                         no stroke, no TIA's, no seizures, no headaches, no temporary blindness one eye,  no slurred speech, no peripheral neuropathy, no chronic pain, no instability of gait, very mild memory/cognitive dysfunction             Musculoskeletal:         + arthritis, no joint swelling, no myalgias, no difficulty walking, normal  mobility              Skin:                            no rash, no itching, no skin infections, no pressure sores or ulcerations             Psych:                         no anxiety, no depression, no nervousness, no unusual recent stress             Eyes:                           no blurry vision, no floaters, no recent vision changes, + wears glasses or contacts             ENT:                            + hearing loss, no loose or painful teeth, + dentures, last saw dentist within the past year              Hematologic:               no easy bruising, no abnormal bleeding, no clotting disorder, no frequent epistaxis             Endocrine:                   no diabetes, does not check CBG's at home                           Physical Exam:              BP (!) 150/60 (BP Location: Right Arm, Patient Position: Sitting, Cuff Size: Normal)   Pulse (!) 57   Resp 18   Ht 5\' 3"  (1.6 m)   Wt 165 lb 9.6 oz (75.1 kg)   LMP 11/05/1992   SpO2 97% Comment: RA  BMI 29.33 kg/m              General:                      Moderately obese,  well-appearing             HEENT:                       Unremarkable              Neck:                           no JVD, no bruits, no adenopathy              Chest:                          clear to auscultation, symmetrical breath sounds, no wheezes, no rhonchi              CV:                              RRR, grade IV/VI late  systolic murmur              Abdomen:                    soft, non-tender, no masses              Extremities:                 warm, well-perfused, pulses diminished, 1+ bilateral LE edema             Rectal/GU                   Deferred             Neuro:                         Grossly non-focal and symmetrical throughout             Skin:                            Clean and dry, no rashes, no breakdown   Diagnostic Tests:  Transthoracic Echocardiography  Patient: Brooke Chavez, Brooke Chavez MR #: [id number] Study Date: 06/29/2015 Gender: F Age: 35 Height: 160 cm Weight: 77.6 kg BSA: 1.88 m^2 Pt. Status: Room:  ATTENDING Brooke Breeding, MD [place], MD [place], MD SONOGRAPHER Leavy Cella PERFORMING [facility]  cc:  ------------------------------------------------------------------- LV EF: 60% - 65%  ------------------------------------------------------------------- Indications: Atrial flutter  427.32.  ------------------------------------------------------------------- History: PMH: Mitral Regurge, Bradycardia, A flutter. Risk factors: Hypertension. Dyslipidemia.  ------------------------------------------------------------------- Study Conclusions  - Left ventricle: The cavity size was normal. Wall thickness was increased in a pattern of mild LVH. Systolic function was normal. The estimated ejection fraction was in the range of 60% to 65%. Wall motion was normal; there were no regional wall motion abnormalities. Left ventricular diastolic function parameters were normal. - Mitral valve: Mild anterior leaflet prolapse with moderate, posteriorly directed regurgitation. - Left atrium: The atrium was moderately dilated.  Transthoracic echocardiography. M-mode, complete 2D, spectral Doppler, and color Doppler. Birthdate: Patient birthdate: 11-13-1940. Age: Patient is 77 yr old. Sex: Gender: female. BMI: 30.3 kg/m^2. Blood pressure: 120/53 Patient status: Outpatient. Study date: Study date: 06/29/2015. Study time: 11:27 AM. Location: Echo laboratory.  -------------------------------------------------------------------  ------------------------------------------------------------------- Left ventricle: The cavity size was normal. Wall thickness was increased in a pattern of mild LVH. Systolic function was normal. The estimated ejection fraction was in the range of 60% to 65%. Wall motion was normal; there were no regional wall motion abnormalities. The transmitral flow pattern was normal. The deceleration time of the early transmitral flow velocity was normal. The pulmonary vein flow pattern was normal. The tissue Doppler parameters were normal. Left ventricular diastolic function parameters were normal.  ------------------------------------------------------------------- Aortic valve: Structurally normal valve. Cusp  separation was normal. Doppler: Transvalvular velocity was within the normal range. There was no stenosis. There was no regurgitation.  ------------------------------------------------------------------- Aorta: Aortic root: The aortic root was normal in size.  ------------------------------------------------------------------- Mitral valve: Mild anterior leaflet prolapse with moderate, posteriorly directed regurgitation. Normal thickness leaflets . Doppler: Peak gradient (D): 4 mm Hg.  ------------------------------------------------------------------- Left atrium: The atrium was moderately dilated.  ------------------------------------------------------------------- Atrial septum: No defect or patent foramen ovale was identified.  ------------------------------------------------------------------- Right ventricle: The cavity size was normal. Wall thickness was normal. Systolic function was normal.  ------------------------------------------------------------------- Pulmonic valve: Poorly visualized. Doppler: There was no  significant regurgitation.  ------------------------------------------------------------------- Tricuspid valve: Structurally normal valve. Leaflet separation was normal. Doppler: Transvalvular velocity was within the normal range. There was no regurgitation.  ------------------------------------------------------------------- Right atrium: The atrium was normal in size.  ------------------------------------------------------------------- Pericardium: There was no pericardial effusion.  ------------------------------------------------------------------- Systemic veins: Inferior vena cava: The vessel was normal in size. The respirophasic diameter changes were in the normal range (>= 50%), consistent with normal central venous  pressure.  ------------------------------------------------------------------- Measurements  Left ventricle Value Reference LV ID, ED, PLAX chordal 51 mm 43 - 52 LV ID, ES, PLAX chordal 35.1 mm 23 - 38 LV fx shortening, PLAX chordal 31 % >=29 LV PW thickness, ED 14.7 mm --------- IVS/LV PW ratio, ED 0.66 <=1.3 LV e&', lateral 11.5 cm/s --------- LV E/e&', lateral 8.39 --------- LV e&', medial 9.24 cm/s --------- LV E/e&', medial 10.44 --------- LV e&', average 10.37 cm/s --------- LV E/e&', average 9.31 ---------  Ventricular septum Value Reference IVS thickness, ED 9.67 mm ---------  Aorta Value Reference Aortic root ID, ED 31 mm ---------  Left atrium Value Reference LA ID, A-P, ES 42 mm --------- LA ID/bsa, A-P (H) 2.23 cm/m^2 <=2.2 LA volume, ES, 1-p A4C 82.7 ml --------- LA volume/bsa, ES, 1-p A4C 43.9 ml/m^2 --------- LA volume, ES, 1-p A2C 56.9 ml --------- LA volume/bsa, ES, 1-p A2C 30.2 ml/m^2 ---------  Mitral valve Value Reference Mitral E-wave peak velocity 96.5 cm/s --------- Mitral A-wave peak velocity 64.5 cm/s --------- Mitral deceleration time 215 ms 150 - 230 Mitral peak gradient, D 4 mm Hg --------- Mitral E/A ratio, peak 1.5  ---------  Systemic veins Value Reference Estimated CVP 3 mm Hg ---------  Legend: (L) and (H) mark values outside specified reference range.  ------------------------------------------------------------------- Prepared and Electronically Authenticated by  Kate Sable, MD 2016-11-18T17:58:39   Transthoracic Echocardiography  Patient: Margean, Korell MR #: [id number] Study Date: 06/12/2017 Gender: F Age: 11 Height: 157.5 cm Weight: 73.9 kg BSA: 1.82 m^2 Pt. Status: Room:  ATTENDING Brooke Breeding, MD ORDERING Brooke Breeding, MD REFERRING Brooke Breeding, MD SONOGRAPHER Cindy Hazy, RDCS PERFORMING [facility]  cc:  ------------------------------------------------------------------- LV EF: 45% - 50%  ------------------------------------------------------------------- Indications: I05.9 Mitral Valve Disorder.  ------------------------------------------------------------------- History: PMH: Acquired from the patient and from the patient&'s chart. PMH: Atrial Flutter. Tricuspid and Mitral Regurgitation. Bradycardia. Degenerative joint disease. Risk factors: Hypertension. Dyslipidemia.  ------------------------------------------------------------------- Study Conclusions  - Left ventricle: The cavity size was normal. There was mild concentric hypertrophy. Systolic function was mildly reduced. The estimated ejection fraction was in the range of 45% to 50%. Wall motion was normal; there were no regional wall motion abnormalities. - Aortic valve: Trileaflet; normal thickness leaflets. There was no regurgitation. - Mitral valve: Thickened mitral valve with prolapse of the anterior leaflet and posteriorly directed jet of mitral regurgitation. There was mild regurgitation directed  posteriorly. - Left atrium: The atrium was severely dilated. - Right ventricle: The cavity size was normal. Wall thickness was normal. Systolic function was normal. - Right atrium: The atrium was moderately dilated. - Tricuspid valve: There was mild regurgitation. - Pulmonary arteries: Systolic pressure was moderately increased. PA peak pressure: 50 mm Hg (S).  Impressions:  - When compared to the prior study from 06/29/2015 there are significant changes. LVEF has dropped from 60-65% to 45-50%. Thickened mitral valve with prolapse of the anterior leaflet and posteriorly directed jet of mitral regurgitation. Mitral regurgitation is at least moderate, most probably severe. Left atrium is now severely dilated and there is new moderate pulmonary hypertension. A TEE  is recommended for further evaluation, surgical consult should be considered.  ------------------------------------------------------------------- Study data: Study status: Routine. Procedure: The patient reported no pain pre or post test. Transthoracic echocardiography for left ventricular function evaluation, for right ventricular function evaluation, and for assessment of valvular function. Image quality was adequate. Study completion: There were no complications. Transthoracic echocardiography. M-mode, complete 2D, spectral Doppler, and color Doppler. Birthdate: Patient birthdate: 1940-09-06. Age: Patient is 77 yr old. Sex: Gender: female. BMI: 29.8 kg/m^2. Patient status: Outpatient. Study date: Study date: 06/12/2017. Study time: 01:12 PM. Location: [facility]  -------------------------------------------------------------------  ------------------------------------------------------------------- Left ventricle: The cavity size was normal. There was mild concentric hypertrophy. Systolic function was mildly reduced. The estimated ejection fraction was in  the range of 45% to 50%. Wall motion was normal; there were no regional wall motion abnormalities. The study was not technically sufficient to allow evaluation of LV diastolic dysfunction due to atrial fibrillation.  ------------------------------------------------------------------- Aortic valve: Trileaflet; normal thickness leaflets. Mobility was not restricted. Doppler: Transvalvular velocity was within the normal range. There was no stenosis. There was no regurgitation.  ------------------------------------------------------------------- Aorta: Aortic root: The aortic root was normal in size.  ------------------------------------------------------------------- Mitral valve: Thickened mitral valve with prolapse of the anterior leaflet and posteriorly directed jet of mitral regurgitation. Mobility was not restricted. Doppler: Transvalvular velocity was within the normal range. There was no evidence for stenosis. There was mild regurgitation directed posteriorly. Peak gradient (D): 4 mm Hg.  ------------------------------------------------------------------- Left atrium: The atrium was severely dilated.  ------------------------------------------------------------------- Right ventricle: The cavity size was normal. Wall thickness was normal. Systolic function was normal.  ------------------------------------------------------------------- Pulmonic valve: Structurally normal valve. Cusp separation was normal. Doppler: Transvalvular velocity was within the normal range. There was no evidence for stenosis. There was no regurgitation.  ------------------------------------------------------------------- Tricuspid valve: Structurally normal valve. Doppler: Transvalvular velocity was within the normal range. There was mild regurgitation.  ------------------------------------------------------------------- Pulmonary artery: The main pulmonary artery  was normal-sized. Systolic pressure was moderately increased.  ------------------------------------------------------------------- Right atrium: The atrium was moderately dilated.  ------------------------------------------------------------------- Pericardium: There was no pericardial effusion.  ------------------------------------------------------------------- Systemic veins: Inferior vena cava: The vessel was normal in size.  ------------------------------------------------------------------- Measurements  Left ventricle Value Reference LV ID, ED, PLAX chordal (H) 52.7 mm 43 - 52 LV ID, ES, PLAX chordal 33.3 mm 23 - 38 LV fx shortening, PLAX chordal 37 % >=29 LV PW thickness, ED 12.9 mm --------- IVS/LV PW ratio, ED 0.7 <=1.3 Stroke volume, 2D 74 ml --------- Stroke volume/bsa, 2D 41 ml/m^2 --------- LV e&', lateral 16.4 cm/s --------- LV E/e&', lateral 6.1 --------- LV e&', medial 9.76 cm/s --------- LV E/e&', medial 10.25 --------- LV e&', average 13.08 cm/s --------- LV E/e&', average 7.65 ---------  Ventricular septum Value Reference IVS thickness, ED 9.09 mm ---------  LVOT Value Reference LVOT ID, S 22 mm --------- LVOT area 3.8 cm^2 --------- LVOT ID 22 mm --------- LVOT peak velocity, S 79.4 cm/s --------- LVOT mean velocity, S 52.7 cm/s  --------- LVOT VTI, S 19.4 cm --------- LVOT peak gradient, S 3 mm Hg --------- Stroke volume (SV), LVOT DP 73.7 ml --------- Stroke index (SV/bsa), LVOT DP 40.4 ml/m^2 ---------  Aorta Value Reference Aortic root ID, ED 33 mm ---------  Left atrium Value Reference LA ID, A-P, ES 52 mm --------- LA ID/bsa, A-P (H) 2.85 cm/m^2 <=2.2 LA volume, S 99 ml --------- LA volume/bsa, S 54.3 ml/m^2 --------- LA volume, ES, 1-p A4C 104 ml --------- LA volume/bsa, ES, 1-p A4C 57 ml/m^2 --------- LA  volume, ES, 1-p A2C 90 ml --------- LA volume/bsa, ES, 1-p A2C 49.3 ml/m^2 ---------  Mitral valve Value Reference Mitral E-wave peak velocity 100 cm/s --------- Mitral peak gradient, D 4 mm Hg ---------  Pulmonary arteries Value Reference PA pressure, S, DP (H) 50 mm Hg <=30  Tricuspid valve Value Reference Tricuspid regurg peak velocity 342 cm/s --------- Tricuspid peak RV-RA gradient 47 mm Hg ---------  Right ventricle Value Reference RV s&', lateral, S 15.36 cm/s ---------  Legend: (L) and (H) mark values outside specified reference range.  ------------------------------------------------------------------- Prepared and Electronically Authenticated by  Ena Dawley, M.D. 2018-11-02T14:35:11   Transesophageal Echocardiography  Patient: Brooke Chavez, Brooke Chavez MR #:  [id number] Study Date: 09/18/2017 Gender: F Age: 7 Height: 160 cm Weight: 72.6 kg BSA: 1.82 m^2 Pt. Status: Room:  SONOGRAPHER [facility] ADMITTING Brooke Latch, MD ATTENDING Brooke Latch, MD ORDERING Brooke Latch, MD PERFORMING Brooke Latch, MD [place], MD  cc:  ------------------------------------------------------------------- LV EF: 45% - 50%  ------------------------------------------------------------------- Indications: Mitral valve prolapse 424.0.  ------------------------------------------------------------------- History: PMH: Atrial flutter. Mitral valve disease. Risk factors: Hypertension. Dyslipidemia.  ------------------------------------------------------------------- Study Conclusions  - Left ventricle: Systolic function was mildly reduced. The estimated ejection fraction was in the range of 45% to 50%. Wall motion was normal; there were no regional wall motion abnormalities. - Aortic valve: There was trivial regurgitation. - Mitral valve: Elongation, consistent with myxomatous proliferation. Moderate prolapse, involving the middle segment of the anterior leaflet and the medial scallop of the posterior leaflet. There was severe regurgitation directed eccentrically and posteriorly. Severe regurgitation is suggested by pulmonary vein systolic flow reversal. - Left atrium: The atrium was severely dilated. No evidence of thrombus in the atrial cavity or appendage. No evidence of thrombus in the atrial cavity or appendage. - Right ventricle: Systolic function was normal. - Right atrium: The atrium was severely dilated. No evidence of thrombus in the atrial cavity or appendage. - Atrial septum: There was a patent foramen ovale with left to right flow at rest. - Tricuspid valve: There was mild regurgitation. Peak  RV-RA gradient (S): 41 mm Hg.  ------------------------------------------------------------------- Study data: Study status: Routine. Consent: The risks, benefits, and alternatives to the procedure were explained to the patient and informed consent was obtained. Procedure: The patient reported no pain pre or post test. Initial setup. The patient was brought to the laboratory. Surface ECG leads were monitored. Sedation. Conscious sedation was administered by cardiology staff. Transesophageal echocardiography. Topical anesthesia was obtained using viscous lidocaine. An adult multiplane transesophageal probe was inserted by the attending cardiologistwithout difficulty. Image quality was adequate. Study completion: The patient tolerated the procedure well. There were no complications. Administered medications: Fentanyl, 62.68mcg, IV. Midazolam, 4mg , IV. Diagnostic transesophageal echocardiography. 2D and color Doppler. Birthdate: Patient birthdate: Jan 05, 1941. Age: Patient is 77 yr old. Sex: Gender: female. BMI: 28.4 kg/m^2. Blood pressure: 171/67 Patient status: Outpatient. Study date: Study date: 09/18/2017. Study time: 11:44 AM. Location: Endoscopy.  -------------------------------------------------------------------  ------------------------------------------------------------------- Left ventricle: Systolic function was mildly reduced. The estimated ejection fraction was in the range of 45% to 50%. Wall motion was normal; there were no regional wall motion abnormalities.  ------------------------------------------------------------------- Aortic valve: Structurally normal valve. Trileaflet; normal thickness leaflets. Cusp separation was normal. Doppler: There was trivial regurgitation.  ------------------------------------------------------------------- Aorta: There was no atheroma. There was no evidence for dissection. Aortic root:  The aortic root was not dilated. Ascending aorta: The ascending aorta was normal in size. Aortic arch: The aortic arch was normal in size. Descending aorta: The  descending aorta was normal in size.  ------------------------------------------------------------------- Mitral valve: Elongation, consistent with myxomatous proliferation. Leaflet separation was normal. Moderate prolapse, involving the middle segment of the anterior leaflet and the medial scallop of the posterior leaflet. Doppler: There was severe regurgitation directed eccentrically and posteriorly. Severe regurgitation is suggested by pulmonary vein systolic flow reversal.  ------------------------------------------------------------------- Left atrium: The atrium was severely dilated. No evidence of thrombus in the atrial cavity or appendage. No evidence of thrombus in the atrial cavity or appendage. The appendage was morphologically a left appendage, multilobulated, and of normal size. Emptying velocity was normal.  ------------------------------------------------------------------- Atrial septum: There was a patent foramen ovale with left to right flow at rest.  ------------------------------------------------------------------- Right ventricle: The cavity size was normal. Wall thickness was normal. Systolic function was normal.  ------------------------------------------------------------------- Pulmonic valve: Structurally normal valve.  ------------------------------------------------------------------- Tricuspid valve: Structurally normal valve. Leaflet separation was normal. Doppler: There was mild regurgitation.  ------------------------------------------------------------------- Pulmonary artery: The main pulmonary artery was normal-sized.  ------------------------------------------------------------------- Right atrium: The atrium was severely dilated. No evidence  of thrombus in the atrial cavity or appendage. The appendage was morphologically a right appendage.  ------------------------------------------------------------------- Pericardium: There was no pericardial effusion.  ------------------------------------------------------------------- Measurements  Mitral valve Value Mitral regurg VTI, PISA 207 cm Mitral ERO, PISA 0.3 cm^2 Mitral regurg volume, PISA 62 ml  Tricuspid valve Value Tricuspid regurg peak velocity 318.45 cm/s Tricuspid peak RV-RA gradient 41 mm Hg Tricuspid maximal regurg velocity, PISA 318.45 cm/s  Legend: (L) and (H) mark values outside specified reference range.  ------------------------------------------------------------------- Prepared and Electronically Authenticated by  Brooke Latch, MD 2019-02-08T17:17:53    RIGHT/LEFT HEART CATH AND CORONARY ANGIOGRAPHY  Conclusion   1. Widely patent coronary arteries without obstructive CAD 2. Essentially normal right heart hemodynamics with minimal elevation of pulmonary artery pressure 3. Prominent V wave and pulmonary capillary wedge tracing suggestive of hemodynamically significant mitral regurgitation    Indications   Severe mitral regurgitation [I34.0 (ICD-10-CM)]  Procedural Details/Technique   Technical Details INDICATION: Severe mitral regurgitation (preoperative study)  PROCEDURAL DETAILS: Ultrasound guidance is used to access the right femoral vein. The ultrasound images captured and stored in the patient's chart. A 7 French sheath is inserted. A Swan-Ganz catheter is used for the right heart procedure. Pressures were recorded and oxygen saturations are obtained. Fick cardiac output is calculated. Using the modified Seldinger technique a 5/6  French Slender sheath was placed in the right radial artery. Intra-arterial verapamil was administered through the radial artery sheath. IV heparin was administered after a JR4 catheter was advanced into the central aorta. A Swan-Ganz catheter was used for the right heart catheterization. Standard protocol was followed for recording of right heart pressures and sampling of oxygen saturations. Fick cardiac output was calculated. Standard Judkins catheters were used for selective coronary angiography. Left ventricular pressure is recorded with a JR4 catheter. Because of subclavian tortuosity, a 5 [place] JR4 guide catheter was used to engage the right coronary artery. There were no immediate procedural complications. The patient was transferred to the post catheterization recovery area for further monitoring.    Estimated blood loss <50 mL.  During this procedure the patient was administered the following to achieve and maintain moderate conscious sedation: Versed 1 mg, Fentanyl 25 mcg, while the patient's heart rate, blood pressure, and oxygen saturation were continuously monitored. The period of conscious sedation was 31 minutes, of which I was present face-to-face 100% of this time.  Coronary Findings   Diagnostic  Dominance: Right  Left Main  The vessel exhibits minimal luminal irregularities.  Left Anterior Descending  The vessel exhibits minimal luminal irregularities.  Left Circumflex  The vessel exhibits minimal luminal irregularities.  Right Coronary Artery  The vessel exhibits minimal luminal irregularities.  Intervention   No interventions have been documented.  Coronary Diagrams   Diagnostic Diagram       Implants    No implant documentation for this case.  MERGE Images   Show images for CARDIAC CATHETERIZATION   Link to Procedure Log   Procedure Log    Hemo Data    Most Recent Value  Fick Cardiac Output 5.87 L/min  Fick Cardiac Output Index 3.32  (L/min)/BSA  Aortic Mean Gradient 5 mmHg  Aortic Peak Gradient 1 mmHg  Aortic Valve Area 3.10  Aortic Value Area Index 1.75 cm2/BSA  RA A Wave -99 mmHg  RA V Wave 0 mmHg  RA Mean 2 mmHg  RV Systolic Pressure 47 mmHg  RV Diastolic Pressure -3 mmHg  RV EDP 1 mmHg  PA Systolic Pressure 49 mmHg  PA Diastolic Pressure 11 mmHg  PA Mean 23 mmHg  PW A Wave -99 mmHg  PW V Wave 17 mmHg  PW Mean 9 mmHg  AO Systolic Pressure 833 mmHg  AO Diastolic Pressure 49 mmHg  AO Mean 82 mmHg  LV Systolic Pressure 825 mmHg  LV Diastolic Pressure 0 mmHg  LV EDP 5 mmHg  Arterial Occlusion Pressure Extended Systolic Pressure 053 mmHg  Arterial Occlusion Pressure Extended Diastolic Pressure 62 mmHg  Arterial Occlusion Pressure Extended Mean Pressure 90 mmHg  Left Ventricular Apex Extended Systolic Pressure 976 mmHg  Left Ventricular Apex Extended Diastolic Pressure 0 mmHg  Left Ventricular Apex Extended EDP Pressure 2 mmHg  QP/QS 1  TPVR Index 6.94 HRUI  TSVR Index 26.82 HRUI  PVR SVR Ratio 0.16  TPVR/TSVR Ratio 0.26    CT ANGIOGRAPHY CHEST, ABDOMEN AND PELVIS  TECHNIQUE: Multidetector CT imaging through the chest, abdomen and pelvis was performed using the standard protocol during bolus administration of intravenous contrast. Multiplanar reconstructed images and MIPs were obtained and reviewed to evaluate the vascular anatomy.  CONTRAST: 49mL ISOVUE-370 IOPAMIDOL (ISOVUE-370) INJECTION 76% per Dr. Anselm Pancoast  COMPARISON: 06/09/2011 by report only  FINDINGS: CTA CHEST FINDINGS  Cardiovascular: Mild four-chamber cardiac enlargement. No pericardial effusion. Mildly dilated central pulmonary arteries. Satisfactory opacification of pulmonary arteries noted, and there is no evidence of pulmonary emboli. Scattered coronary calcifications. Adequate contrast opacification of the thoracic aorta with no evidence of dissection, aneurysm, or stenosis. There is bovine variant brachiocephalic arch  anatomy without proximal stenosis. Scattered calcified plaque in the arch and descending thoracic aorta.  Mediastinum/Nodes: No hilar or mediastinal adenopathy.  Lungs/Pleura: Lungs are clear. No pleural effusion or pneumothorax.  Musculoskeletal: No chest wall abnormality. No acute or significant osseous findings. Anterior vertebral endplate spurring at multiple levels in the mid and lower thoracic spine.  Review of the MIP images confirms the above findings.  CTA ABDOMEN AND PELVIS FINDINGS  VASCULAR  Aorta: No aneurysm, dissection, or stenosis. Scattered calcified plaque in the infrarenal segment.  Celiac: Calcified ostial plaque resulting in mild short-segment stenosis, patent distally.  SMA: Patent without evidence of aneurysm, dissection, vasculitis or significant stenosis.  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  IMA: Diminutive, patent  Inflow: Scattered plaque in the common and  internal iliac arteries without stenosis. Mild tortuosity. No aneurysm.  Veins: There is abnormal early opacification of the right femoral vein seen at the lower margin of the scan in the proximal thigh, suggesting high-flow lesion or AV fistula more peripherally in the right lower extremity. Inflow artifact in the right iliac venous system and IVC.  Review of the MIP images confirms the above findings.  NON-VASCULAR  Hepatobiliary: Multiple enhancing poorly marginated lesions in the liver measured up to 1.3 cm diameter (image 82/4), which are inconspicuous on delayed scans through the liver. 4.1 cm cyst in segment 4B. 2.1 cm cyst in segment 2. 1 cm probable subcapsular cyst in segment 8. Gallbladder is unremarkable. No biliary ductal dilatation.  Pancreas: 0.8 cm hypervascular  lesion in the pancreatic body image 94/4. No ductal dilatation. No adjacent inflammatory changes.  Spleen: Normal in size without focal abnormality.  Adrenals/Urinary Tract: Bilateral adrenal hyperplasia. Multiple left renal cysts, largest 1.8 cm in the lower pole. No hydronephrosis. 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 Urinary bladder is nondistended  Stomach/Bowel: Stomach and small bowel are nondilated. Normal appendix. Colon is nondilated with a few scattered descending and sigmoid diverticula; no adjacent inflammatory/edematous change.  Lymphatic: No abdominal or pelvic adenopathy.  Reproductive: Uterus and bilateral adnexa are unremarkable.  Other: No ascites. No free air.  Musculoskeletal: Compression deformities of L2 and L3, seen on prior radiographs of 01/04/2013. Marked narrowing of the L4-5 interspace as before. Fixation hardware across the left femoral neck. No acute fracture or worrisome bone lesion.  Review of the MIP images confirms the above findings.  IMPRESSION: 1. Scattered aortoiliac arterial plaque without aneurysm or significant stenosis. There is mild tortuosity of the iliac arterial systems. 2. 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 3. Abnormal early enhancement of the right femoral vein suggesting high-flow lesion or AV fistula more peripherally in the right lower extremity, below the level of the scan. 4. Enhancing subcentimeter pancreatic lesion and multiple hypervascular liver lesions suggesting possibility of metastatic disease. Consider MR abdomen with and without contrast for further characterization.  These results will be called to the ordering clinician or representative by the Radiologist Assistant, and communication documented in the PACS or zVision Dashboard.   Electronically Signed By: Lucrezia Europe M.D. On: 11/13/2017  11:59     Impression:  Patient has mitral valve prolapse with stage D severe symptomatic primary mitral regurgitation. She describes stable symptoms of exertional shortness of breath that occur only with more strenuous physical exertion such as walking up a flight of stairs or walking up an incline, consistent with chronic diastolic congestive heart failure New [place] Heart Association functional class II. I have personally reviewed the patient's recent transthoracic and transesophageal echocardiograms. 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 There are no obvious ruptured primary chordae tendinaebut there is severe elongation and prolapse involving a portion of the middle scallop of the anterior leaflet. There is an eccentric jet of regurgitation that courses posteriorly around the dilated left atrium. There was flow reversal in the pulmonary veins. There is moderate left ventricular hypertrophy and mild left ventricular systolic dysfunction with ejection fraction estimated only 45-50% in the setting of severe mitral regurgitation. I agree the patient needs elective mitral valve repair.Diagnostic cardiac catheterization reveals normal coronary arteries with no significant coronary artery disease. Right heart pressures were mildly elevated. CT angiography revealed no contraindication to peripheral cannulation for surgery.  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  There is no sign of metastatic disease.   Plan:  The patient wasagaincounseled at length regarding the indications, risks and potential benefits of mitral valve repair. The rationale for elective surgery has been explained, including a comparison between surgery and continued medical therapy with close follow-up. The likelihood of  successful and durable valve repair has been discussed with particular reference to the findings of their recent echocardiogram. Based upon these findings and previous experience, I have quoted them a greater than 90percent likelihood of successful valve repair. In the unlikely event that their valve cannot be successfully repaired, we discussed the possibility of replacing the mitral valve using a mechanical prosthesis with the attendant need for long-term anticoagulation versus the alternative of replacing it using a bioprosthetic tissue valve with its potential for late structural valve deterioration and failure, depending upon the patient's longevity. The patient specifically requests that if the mitral valve must be replaced that it be done using a bioprosthetic tissuevalve. The patient understands and accepts all potential risks of surgery including but not limited to risk of death, stroke or other neurologic complication, myocardial infarction, congestive heart failure, respiratory failure, renal failure, bleeding requiring transfusion and/or reexploration, arrhythmia, infection or other wound complications, pneumonia, pleural and/or pericardial effusion, pulmonary embolus, aortic dissection or other major vascular complication, or delayed complications related to valve repair or replacement including but not limited to structural valve deterioration and failure, thrombosis, embolization, endocarditis, or paravalvular leak. Alternative surgical approaches have been discussed including a comparison between conventional sternotomy and minimally-invasive techniques. The relative risks and benefits of each have been reviewed as they pertain to the patient's specific circumstances, and all of their questions have been addressed. Specific risks potentially related to the minimally-invasive approach were discussed at length, including but not limited to risk of conversion to full or partial sternotomy,  aortic dissection or other major vascular complication, unilateral acute lung injury or pulmonary edema, phrenic nerve dysfunction or paralysis, rib fracture, chronic pain, lung hernia, or lymphocele. At the time of surgery we will plan to close the patient's patent foramen ovale. We will also plan to clip her left atrial appendage given her history of atrial flutter. All of her questions have been answered.  We tentatively plan to proceed with surgery on Jan 07, 2018. Patient has been instructed to stop taking Eliquis 7 days prior to surgery.      Valentina Gu. Roxy Manns, MD 12/18/2017 4:35 PM

## 2018-01-07 ENCOUNTER — Inpatient Hospital Stay (HOSPITAL_COMMUNITY): Payer: Medicare Other | Admitting: Emergency Medicine

## 2018-01-07 ENCOUNTER — Inpatient Hospital Stay (HOSPITAL_COMMUNITY): Payer: Medicare Other

## 2018-01-07 ENCOUNTER — Inpatient Hospital Stay (HOSPITAL_COMMUNITY): Payer: Medicare Other | Admitting: Certified Registered Nurse Anesthetist

## 2018-01-07 ENCOUNTER — Inpatient Hospital Stay (HOSPITAL_COMMUNITY)
Admission: RE | Admit: 2018-01-07 | Discharge: 2018-01-14 | DRG: 219 | Disposition: A | Discharge status: Home / Self Care | Payer: Medicare Other | Attending: Thoracic Surgery (Cardiothoracic Vascular Surgery) | Admitting: Thoracic Surgery (Cardiothoracic Vascular Surgery)

## 2018-01-07 ENCOUNTER — Encounter (HOSPITAL_COMMUNITY): Payer: Self-pay | Admitting: *Deleted

## 2018-01-07 ENCOUNTER — Ambulatory Visit (HOSPITAL_COMMUNITY): Payer: Medicare Other

## 2018-01-07 ENCOUNTER — Other Ambulatory Visit: Payer: Self-pay

## 2018-01-07 ENCOUNTER — Encounter (HOSPITAL_COMMUNITY)
Admission: RE | Disposition: A | Discharge status: Home / Self Care | Payer: Self-pay | Source: Home / Self Care | Attending: Thoracic Surgery (Cardiothoracic Vascular Surgery)

## 2018-01-07 DIAGNOSIS — M1990 Unspecified osteoarthritis, unspecified site: Secondary | ICD-10-CM | POA: Diagnosis present

## 2018-01-07 DIAGNOSIS — J9811 Atelectasis: Secondary | ICD-10-CM | POA: Diagnosis not present

## 2018-01-07 DIAGNOSIS — D3A8 Other benign neuroendocrine tumors: Secondary | ICD-10-CM | POA: Diagnosis present

## 2018-01-07 DIAGNOSIS — J939 Pneumothorax, unspecified: Secondary | ICD-10-CM | POA: Diagnosis not present

## 2018-01-07 DIAGNOSIS — I454 Nonspecific intraventricular block: Secondary | ICD-10-CM | POA: Diagnosis present

## 2018-01-07 DIAGNOSIS — I97190 Other postprocedural cardiac functional disturbances following cardiac surgery: Secondary | ICD-10-CM | POA: Diagnosis not present

## 2018-01-07 DIAGNOSIS — I5033 Acute on chronic diastolic (congestive) heart failure: Secondary | ICD-10-CM | POA: Diagnosis not present

## 2018-01-07 DIAGNOSIS — Z885 Allergy status to narcotic agent status: Secondary | ICD-10-CM

## 2018-01-07 DIAGNOSIS — K219 Gastro-esophageal reflux disease without esophagitis: Secondary | ICD-10-CM | POA: Diagnosis present

## 2018-01-07 DIAGNOSIS — I9789 Other postprocedural complications and disorders of the circulatory system, not elsewhere classified: Secondary | ICD-10-CM | POA: Diagnosis not present

## 2018-01-07 DIAGNOSIS — I459 Conduction disorder, unspecified: Secondary | ICD-10-CM | POA: Diagnosis present

## 2018-01-07 DIAGNOSIS — I440 Atrioventricular block, first degree: Secondary | ICD-10-CM | POA: Diagnosis present

## 2018-01-07 DIAGNOSIS — I080 Rheumatic disorders of both mitral and aortic valves: Secondary | ICD-10-CM | POA: Diagnosis present

## 2018-01-07 DIAGNOSIS — Z886 Allergy status to analgesic agent status: Secondary | ICD-10-CM

## 2018-01-07 DIAGNOSIS — I340 Nonrheumatic mitral (valve) insufficiency: Principal | ICD-10-CM

## 2018-01-07 DIAGNOSIS — J90 Pleural effusion, not elsewhere classified: Secondary | ICD-10-CM | POA: Diagnosis not present

## 2018-01-07 DIAGNOSIS — Y838 Other surgical procedures as the cause of abnormal reaction of the patient, or of later complication, without mention of misadventure at the time of the procedure: Secondary | ICD-10-CM | POA: Diagnosis not present

## 2018-01-07 DIAGNOSIS — D6959 Other secondary thrombocytopenia: Secondary | ICD-10-CM | POA: Diagnosis not present

## 2018-01-07 DIAGNOSIS — Q211 Atrial septal defect: Secondary | ICD-10-CM

## 2018-01-07 DIAGNOSIS — Z6830 Body mass index (BMI) 30.0-30.9, adult: Secondary | ICD-10-CM | POA: Diagnosis not present

## 2018-01-07 DIAGNOSIS — Z806 Family history of leukemia: Secondary | ICD-10-CM

## 2018-01-07 DIAGNOSIS — I341 Nonrheumatic mitral (valve) prolapse: Secondary | ICD-10-CM | POA: Diagnosis present

## 2018-01-07 DIAGNOSIS — E785 Hyperlipidemia, unspecified: Secondary | ICD-10-CM | POA: Diagnosis present

## 2018-01-07 DIAGNOSIS — Z4682 Encounter for fitting and adjustment of non-vascular catheter: Secondary | ICD-10-CM | POA: Diagnosis not present

## 2018-01-07 DIAGNOSIS — I4891 Unspecified atrial fibrillation: Secondary | ICD-10-CM | POA: Diagnosis not present

## 2018-01-07 DIAGNOSIS — J9383 Other pneumothorax: Secondary | ICD-10-CM | POA: Diagnosis not present

## 2018-01-07 DIAGNOSIS — R001 Bradycardia, unspecified: Secondary | ICD-10-CM | POA: Diagnosis present

## 2018-01-07 DIAGNOSIS — I5032 Chronic diastolic (congestive) heart failure: Secondary | ICD-10-CM | POA: Diagnosis not present

## 2018-01-07 DIAGNOSIS — I110 Hypertensive heart disease with heart failure: Secondary | ICD-10-CM | POA: Diagnosis present

## 2018-01-07 DIAGNOSIS — I2510 Atherosclerotic heart disease of native coronary artery without angina pectoris: Secondary | ICD-10-CM | POA: Diagnosis present

## 2018-01-07 DIAGNOSIS — D62 Acute posthemorrhagic anemia: Secondary | ICD-10-CM | POA: Diagnosis not present

## 2018-01-07 DIAGNOSIS — E278 Other specified disorders of adrenal gland: Secondary | ICD-10-CM | POA: Diagnosis present

## 2018-01-07 DIAGNOSIS — I272 Pulmonary hypertension, unspecified: Secondary | ICD-10-CM | POA: Diagnosis present

## 2018-01-07 DIAGNOSIS — Z7901 Long term (current) use of anticoagulants: Secondary | ICD-10-CM

## 2018-01-07 DIAGNOSIS — I081 Rheumatic disorders of both mitral and tricuspid valves: Secondary | ICD-10-CM | POA: Diagnosis not present

## 2018-01-07 DIAGNOSIS — Z8249 Family history of ischemic heart disease and other diseases of the circulatory system: Secondary | ICD-10-CM

## 2018-01-07 DIAGNOSIS — Z96653 Presence of artificial knee joint, bilateral: Secondary | ICD-10-CM | POA: Diagnosis present

## 2018-01-07 DIAGNOSIS — Z823 Family history of stroke: Secondary | ICD-10-CM

## 2018-01-07 DIAGNOSIS — Z79899 Other long term (current) drug therapy: Secondary | ICD-10-CM

## 2018-01-07 DIAGNOSIS — Z9889 Other specified postprocedural states: Secondary | ICD-10-CM

## 2018-01-07 DIAGNOSIS — I10 Essential (primary) hypertension: Secondary | ICD-10-CM

## 2018-01-07 DIAGNOSIS — Z954 Presence of other heart-valve replacement: Secondary | ICD-10-CM | POA: Diagnosis not present

## 2018-01-07 DIAGNOSIS — Z833 Family history of diabetes mellitus: Secondary | ICD-10-CM

## 2018-01-07 DIAGNOSIS — I509 Heart failure, unspecified: Secondary | ICD-10-CM

## 2018-01-07 HISTORY — PX: TEE WITHOUT CARDIOVERSION: SHX5443

## 2018-01-07 HISTORY — DX: Other specified postprocedural states: Z98.890

## 2018-01-07 HISTORY — PX: MITRAL VALVE REPAIR: SHX2039

## 2018-01-07 HISTORY — DX: Chronic diastolic (congestive) heart failure: I50.32

## 2018-01-07 HISTORY — PX: PATENT FORAMEN OVALE(PFO) CLOSURE: CATH118300

## 2018-01-07 LAB — POCT I-STAT 4, (NA,K, GLUC, HGB,HCT)
Glucose, Bld: 92 mg/dL (ref 65–99)
HCT: 32 % — ABNORMAL LOW (ref 36.0–46.0)
Hemoglobin: 10.9 g/dL — ABNORMAL LOW (ref 12.0–15.0)
Potassium: 4.1 mmol/L (ref 3.5–5.1)
Sodium: 141 mmol/L (ref 135–145)

## 2018-01-07 LAB — POCT I-STAT, CHEM 8
BUN: 23 mg/dL — ABNORMAL HIGH (ref 6–20)
BUN: 23 mg/dL — ABNORMAL HIGH (ref 6–20)
BUN: 21 mg/dL — ABNORMAL HIGH (ref 6–20)
BUN: 19 mg/dL (ref 6–20)
BUN: 20 mg/dL (ref 6–20)
BUN: 19 mg/dL (ref 6–20)
BUN: 17 mg/dL (ref 6–20)
Calcium, Ion: 1.23 mmol/L (ref 1.15–1.40)
Calcium, Ion: 1.23 mmol/L (ref 1.15–1.40)
Calcium, Ion: 1.07 mmol/L — ABNORMAL LOW (ref 1.15–1.40)
Calcium, Ion: 1.08 mmol/L — ABNORMAL LOW (ref 1.15–1.40)
Calcium, Ion: 1.13 mmol/L — ABNORMAL LOW (ref 1.15–1.40)
Calcium, Ion: 1.06 mmol/L — ABNORMAL LOW (ref 1.15–1.40)
Calcium, Ion: 1.01 mmol/L — ABNORMAL LOW (ref 1.15–1.40)
Chloride: 105 mmol/L (ref 101–111)
Chloride: 106 mmol/L (ref 101–111)
Chloride: 102 mmol/L (ref 101–111)
Chloride: 104 mmol/L (ref 101–111)
Chloride: 105 mmol/L (ref 101–111)
Chloride: 104 mmol/L (ref 101–111)
Chloride: 106 mmol/L (ref 101–111)
Creatinine, Ser: 1.3 mg/dL — ABNORMAL HIGH (ref 0.44–1.00)
Creatinine, Ser: 1.3 mg/dL — ABNORMAL HIGH (ref 0.44–1.00)
Creatinine, Ser: 1.1 mg/dL — ABNORMAL HIGH (ref 0.44–1.00)
Creatinine, Ser: 1.1 mg/dL — ABNORMAL HIGH (ref 0.44–1.00)
Creatinine, Ser: 1.2 mg/dL — ABNORMAL HIGH (ref 0.44–1.00)
Creatinine, Ser: 1.2 mg/dL — ABNORMAL HIGH (ref 0.44–1.00)
Creatinine, Ser: 1.2 mg/dL — ABNORMAL HIGH (ref 0.44–1.00)
Glucose, Bld: 98 mg/dL (ref 65–99)
Glucose, Bld: 123 mg/dL — ABNORMAL HIGH (ref 65–99)
Glucose, Bld: 116 mg/dL — ABNORMAL HIGH (ref 65–99)
Glucose, Bld: 169 mg/dL — ABNORMAL HIGH (ref 65–99)
Glucose, Bld: 174 mg/dL — ABNORMAL HIGH (ref 65–99)
Glucose, Bld: 141 mg/dL — ABNORMAL HIGH (ref 65–99)
Glucose, Bld: 122 mg/dL — ABNORMAL HIGH (ref 65–99)
HCT: 29 % — ABNORMAL LOW (ref 36.0–46.0)
HCT: 30 % — ABNORMAL LOW (ref 36.0–46.0)
HCT: 26 % — ABNORMAL LOW (ref 36.0–46.0)
HCT: 23 % — ABNORMAL LOW (ref 36.0–46.0)
HCT: 24 % — ABNORMAL LOW (ref 36.0–46.0)
HCT: 21 % — ABNORMAL LOW (ref 36.0–46.0)
HCT: 27 % — ABNORMAL LOW (ref 36.0–46.0)
Hemoglobin: 9.9 g/dL — ABNORMAL LOW (ref 12.0–15.0)
Hemoglobin: 10.2 g/dL — ABNORMAL LOW (ref 12.0–15.0)
Hemoglobin: 8.8 g/dL — ABNORMAL LOW (ref 12.0–15.0)
Hemoglobin: 7.8 g/dL — ABNORMAL LOW (ref 12.0–15.0)
Hemoglobin: 8.2 g/dL — ABNORMAL LOW (ref 12.0–15.0)
Hemoglobin: 7.1 g/dL — ABNORMAL LOW (ref 12.0–15.0)
Hemoglobin: 9.2 g/dL — ABNORMAL LOW (ref 12.0–15.0)
Potassium: 3.9 mmol/L (ref 3.5–5.1)
Potassium: 4.2 mmol/L (ref 3.5–5.1)
Potassium: 4 mmol/L (ref 3.5–5.1)
Potassium: 4.4 mmol/L (ref 3.5–5.1)
Potassium: 4.4 mmol/L (ref 3.5–5.1)
Potassium: 4.3 mmol/L (ref 3.5–5.1)
Potassium: 5.3 mmol/L — ABNORMAL HIGH (ref 3.5–5.1)
Sodium: 141 mmol/L (ref 135–145)
Sodium: 139 mmol/L (ref 135–145)
Sodium: 139 mmol/L (ref 135–145)
Sodium: 139 mmol/L (ref 135–145)
Sodium: 140 mmol/L (ref 135–145)
Sodium: 139 mmol/L (ref 135–145)
Sodium: 141 mmol/L (ref 135–145)
TCO2: 24 mmol/L (ref 22–32)
TCO2: 25 mmol/L (ref 22–32)
TCO2: 22 mmol/L (ref 22–32)
TCO2: 22 mmol/L (ref 22–32)
TCO2: 24 mmol/L (ref 22–32)
TCO2: 20 mmol/L — ABNORMAL LOW (ref 22–32)
TCO2: 23 mmol/L (ref 22–32)

## 2018-01-07 LAB — POCT I-STAT 3, ART BLOOD GAS (G3+)
Acid-base deficit: 2 mmol/L (ref 0.0–2.0)
Acid-base deficit: 8 mmol/L — ABNORMAL HIGH (ref 0.0–2.0)
Acid-base deficit: 7 mmol/L — ABNORMAL HIGH (ref 0.0–2.0)
Acid-base deficit: 1 mmol/L (ref 0.0–2.0)
Acid-base deficit: 9 mmol/L — ABNORMAL HIGH (ref 0.0–2.0)
Acid-base deficit: 5 mmol/L — ABNORMAL HIGH (ref 0.0–2.0)
Bicarbonate: 22.3 mmol/L (ref 20.0–28.0)
Bicarbonate: 20.3 mmol/L (ref 20.0–28.0)
Bicarbonate: 20.3 mmol/L (ref 20.0–28.0)
Bicarbonate: 17.7 mmol/L — ABNORMAL LOW (ref 20.0–28.0)
Bicarbonate: 24.5 mmol/L (ref 20.0–28.0)
Bicarbonate: 20.2 mmol/L (ref 20.0–28.0)
O2 Saturation: 100 %
O2 Saturation: 95 %
O2 Saturation: 98 %
O2 Saturation: 98 %
O2 Saturation: 98 %
O2 Saturation: 98 %
Patient temperature: 34.9
Patient temperature: 35.5
Patient temperature: 36.5
Patient temperature: 36.6
Patient temperature: 36.8
TCO2: 23 mmol/L (ref 22–32)
TCO2: 22 mmol/L (ref 22–32)
TCO2: 22 mmol/L (ref 22–32)
TCO2: 19 mmol/L — ABNORMAL LOW (ref 22–32)
TCO2: 26 mmol/L (ref 22–32)
TCO2: 21 mmol/L — ABNORMAL LOW (ref 22–32)
pCO2 arterial: 34.1 mmHg (ref 32.0–48.0)
pCO2 arterial: 51.7 mmHg — ABNORMAL HIGH (ref 32.0–48.0)
pCO2 arterial: 47 mmHg (ref 32.0–48.0)
pCO2 arterial: 39.6 mmHg (ref 32.0–48.0)
pCO2 arterial: 40.4 mmHg (ref 32.0–48.0)
pCO2 arterial: 38 mmHg (ref 32.0–48.0)
pH, Arterial: 7.423 (ref 7.350–7.450)
pH, Arterial: 7.19 — CL (ref 7.350–7.450)
pH, Arterial: 7.236 — ABNORMAL LOW (ref 7.350–7.450)
pH, Arterial: 7.257 — ABNORMAL LOW (ref 7.350–7.450)
pH, Arterial: 7.388 (ref 7.350–7.450)
pH, Arterial: 7.333 — ABNORMAL LOW (ref 7.350–7.450)
pO2, Arterial: 329 mmHg — ABNORMAL HIGH (ref 83.0–108.0)
pO2, Arterial: 88 mmHg (ref 83.0–108.0)
pO2, Arterial: 129 mmHg — ABNORMAL HIGH (ref 83.0–108.0)
pO2, Arterial: 111 mmHg — ABNORMAL HIGH (ref 83.0–108.0)
pO2, Arterial: 118 mmHg — ABNORMAL HIGH (ref 83.0–108.0)
pO2, Arterial: 121 mmHg — ABNORMAL HIGH (ref 83.0–108.0)

## 2018-01-07 LAB — CBC
HCT: 32.9 % — ABNORMAL LOW (ref 36.0–46.0)
HCT: 30.2 % — ABNORMAL LOW (ref 36.0–46.0)
Hemoglobin: 10.8 g/dL — ABNORMAL LOW (ref 12.0–15.0)
Hemoglobin: 9.8 g/dL — ABNORMAL LOW (ref 12.0–15.0)
MCH: 31.3 pg (ref 26.0–34.0)
MCH: 31.3 pg (ref 26.0–34.0)
MCHC: 32.8 g/dL (ref 30.0–36.0)
MCHC: 32.5 g/dL (ref 30.0–36.0)
MCV: 95.4 fL (ref 78.0–100.0)
MCV: 96.5 fL (ref 78.0–100.0)
Platelets: UNDETERMINED 10*3/uL (ref 150–400)
Platelets: 102 10*3/uL — ABNORMAL LOW (ref 150–400)
RBC: 3.45 MIL/uL — ABNORMAL LOW (ref 3.87–5.11)
RBC: 3.13 MIL/uL — ABNORMAL LOW (ref 3.87–5.11)
RDW: 13.2 % (ref 11.5–15.5)
RDW: 13.2 % (ref 11.5–15.5)
WBC: 17.3 10*3/uL — ABNORMAL HIGH (ref 4.0–10.5)
WBC: 14.5 10*3/uL — ABNORMAL HIGH (ref 4.0–10.5)

## 2018-01-07 LAB — GLUCOSE, CAPILLARY
Glucose-Capillary: 119 mg/dL — ABNORMAL HIGH (ref 65–99)
Glucose-Capillary: 68 mg/dL (ref 65–99)
Glucose-Capillary: 88 mg/dL (ref 65–99)
Glucose-Capillary: 102 mg/dL — ABNORMAL HIGH (ref 65–99)
Glucose-Capillary: 107 mg/dL — ABNORMAL HIGH (ref 65–99)

## 2018-01-07 LAB — CREATININE, SERUM
Creatinine, Ser: 1.28 mg/dL — ABNORMAL HIGH (ref 0.44–1.00)
GFR calc Af Amer: 46 mL/min — ABNORMAL LOW (ref >60)
GFR calc non Af Amer: 39 mL/min — ABNORMAL LOW (ref >60)

## 2018-01-07 LAB — APTT: aPTT: 36 seconds (ref 24–36)

## 2018-01-07 LAB — PROTIME-INR
INR: 1.45
Prothrombin Time: 17.5 seconds — ABNORMAL HIGH (ref 11.4–15.2)

## 2018-01-07 LAB — HEMOGLOBIN AND HEMATOCRIT, BLOOD
HCT: 22.5 % — ABNORMAL LOW (ref 36.0–46.0)
Hemoglobin: 7.5 g/dL — ABNORMAL LOW (ref 12.0–15.0)

## 2018-01-07 LAB — PREPARE RBC (CROSSMATCH)

## 2018-01-07 LAB — MAGNESIUM: Magnesium: 3.1 mg/dL — ABNORMAL HIGH (ref 1.7–2.4)

## 2018-01-07 LAB — PLATELET COUNT: Platelets: 138 10*3/uL — ABNORMAL LOW (ref 150–400)

## 2018-01-07 SURGERY — REPAIR, MITRAL VALVE, MINIMALLY INVASIVE
Anesthesia: General | Site: Chest | Laterality: Right

## 2018-01-07 MED ORDER — MAGNESIUM SULFATE 4 GM/100ML IV SOLN
4.0000 g | Freq: Once | INTRAVENOUS | Status: AC
  Administered 2018-01-07: 4 g via INTRAVENOUS

## 2018-01-07 MED ORDER — MIDAZOLAM HCL 2 MG/2ML IJ SOLN: 2.0000 mg | INTRAMUSCULAR | Status: DC | PRN

## 2018-01-07 MED ORDER — FENTANYL CITRATE (PF) 250 MCG/5ML IJ SOLN
INTRAMUSCULAR | Status: DC | PRN
  Administered 2018-01-07: 100 ug via INTRAVENOUS
  Administered 2018-01-07: 300 ug via INTRAVENOUS
  Administered 2018-01-07: 50 ug via INTRAVENOUS
  Administered 2018-01-07: 100 ug via INTRAVENOUS
  Administered 2018-01-07 (×2): 150 ug via INTRAVENOUS
  Administered 2018-01-07: 100 ug via INTRAVENOUS
  Administered 2018-01-07: 50 ug via INTRAVENOUS

## 2018-01-07 MED ORDER — LACTATED RINGERS IV SOLN
INTRAVENOUS | Status: DC | PRN
  Administered 2018-01-07: 07:00:00 via INTRAVENOUS

## 2018-01-07 MED ORDER — GLYCOPYRROLATE 0.2 MG/ML IJ SOLN
INTRAMUSCULAR | Status: DC | PRN
  Administered 2018-01-07: 0.2 mg via INTRAVENOUS

## 2018-01-07 MED ORDER — ASPIRIN 81 MG PO CHEW: 324.0000 mg | CHEWABLE_TABLET | Freq: Every day | ORAL | Status: DC

## 2018-01-07 MED ORDER — SODIUM CHLORIDE 0.9 % IV SOLN: Freq: Once | INTRAVENOUS | Status: DC

## 2018-01-07 MED ORDER — OXYCODONE HCL 5 MG PO TABS: 5.0000 mg | ORAL_TABLET | ORAL | Status: DC | PRN

## 2018-01-07 MED ORDER — SODIUM CHLORIDE 0.9 % IV SOLN: 250.0000 mL | INTRAVENOUS | Status: DC

## 2018-01-07 MED ORDER — METOPROLOL TARTRATE 12.5 MG HALF TABLET
ORAL_TABLET | ORAL | Status: AC
  Administered 2018-01-07: 12.5 mg via ORAL
  Filled 2018-01-07: qty 1

## 2018-01-07 MED ORDER — PROTAMINE SULFATE 10 MG/ML IV SOLN
INTRAVENOUS | Status: DC | PRN
  Administered 2018-01-07: 10 mg via INTRAVENOUS
  Administered 2018-01-07: 230 mg via INTRAVENOUS

## 2018-01-07 MED ORDER — FAMOTIDINE IN NACL 20-0.9 MG/50ML-% IV SOLN
20.0000 mg | Freq: Two times a day (BID) | INTRAVENOUS | Status: AC
  Administered 2018-01-07 (×2): 20 mg via INTRAVENOUS
  Filled 2018-01-07: qty 50

## 2018-01-07 MED ORDER — PROPOFOL 10 MG/ML IV BOLUS
INTRAVENOUS | Status: DC | PRN
  Administered 2018-01-07: 140 mg via INTRAVENOUS

## 2018-01-07 MED ORDER — LACTATED RINGERS IV SOLN
INTRAVENOUS | Status: DC
  Administered 2018-01-07: 20 mL/h via INTRAVENOUS

## 2018-01-07 MED ORDER — EPHEDRINE SULFATE 50 MG/ML IJ SOLN
INTRAMUSCULAR | Status: DC | PRN
  Administered 2018-01-07 (×3): 5 mg via INTRAVENOUS

## 2018-01-07 MED ORDER — PANTOPRAZOLE SODIUM 40 MG PO TBEC
40.0000 mg | DELAYED_RELEASE_TABLET | Freq: Every day | ORAL | Status: DC
  Administered 2018-01-09 – 2018-01-14 (×6): 40 mg via ORAL
  Filled 2018-01-07 (×6): qty 1

## 2018-01-07 MED ORDER — ALBUMIN HUMAN 5 % IV SOLN
250.0000 mL | INTRAVENOUS | Status: AC | PRN
  Administered 2018-01-07 (×3): 250 mL via INTRAVENOUS
  Filled 2018-01-07: qty 250

## 2018-01-07 MED ORDER — VANCOMYCIN HCL IN DEXTROSE 1-5 GM/200ML-% IV SOLN
1000.0000 mg | Freq: Once | INTRAVENOUS | Status: AC
  Administered 2018-01-07: 1000 mg via INTRAVENOUS
  Filled 2018-01-07: qty 200

## 2018-01-07 MED ORDER — EPHEDRINE SULFATE 50 MG/ML IJ SOLN
INTRAMUSCULAR | Status: AC
  Filled 2018-01-07: qty 1

## 2018-01-07 MED ORDER — HEPARIN SODIUM (PORCINE) 1000 UNIT/ML IJ SOLN
INTRAMUSCULAR | Status: DC | PRN
  Administered 2018-01-07: 22000 [IU] via INTRAVENOUS

## 2018-01-07 MED ORDER — DOCUSATE SODIUM 100 MG PO CAPS
200.0000 mg | ORAL_CAPSULE | Freq: Every day | ORAL | Status: DC
  Administered 2018-01-08 – 2018-01-12 (×5): 200 mg via ORAL
  Filled 2018-01-07 (×5): qty 2

## 2018-01-07 MED ORDER — SODIUM CHLORIDE 0.9% FLUSH
3.0000 mL | Freq: Two times a day (BID) | INTRAVENOUS | Status: DC
  Administered 2018-01-09 – 2018-01-14 (×11): 3 mL via INTRAVENOUS

## 2018-01-07 MED ORDER — CHLORHEXIDINE GLUCONATE 0.12% ORAL RINSE (MEDLINE KIT)
15.0000 mL | Freq: Two times a day (BID) | OROMUCOSAL | Status: DC
  Administered 2018-01-07: 15 mL via OROMUCOSAL

## 2018-01-07 MED ORDER — MAGNESIUM SULFATE 4 GM/100ML IV SOLN
INTRAVENOUS | Status: AC
  Administered 2018-01-07: 4 g via INTRAVENOUS
  Filled 2018-01-07: qty 100

## 2018-01-07 MED ORDER — BISACODYL 10 MG RE SUPP: 10.0000 mg | Freq: Every day | RECTAL | Status: DC

## 2018-01-07 MED ORDER — SODIUM CHLORIDE 0.9 % IV SOLN
INTRAVENOUS | Status: DC | PRN
  Administered 2018-01-07: 12:00:00

## 2018-01-07 MED ORDER — INSULIN ASPART 100 UNIT/ML ~~LOC~~ SOLN
0.0000 [IU] | SUBCUTANEOUS | Status: DC
  Administered 2018-01-08 – 2018-01-09 (×4): 2 [IU] via SUBCUTANEOUS

## 2018-01-07 MED ORDER — DEXMEDETOMIDINE HCL IN NACL 200 MCG/50ML IV SOLN
0.0000 ug/kg/h | INTRAVENOUS | Status: DC
  Administered 2018-01-07: 0.7 ug/kg/h via INTRAVENOUS
  Filled 2018-01-07: qty 50

## 2018-01-07 MED ORDER — FENTANYL CITRATE (PF) 250 MCG/5ML IJ SOLN
INTRAMUSCULAR | Status: AC
  Filled 2018-01-07: qty 25

## 2018-01-07 MED ORDER — METOPROLOL TARTRATE 12.5 MG HALF TABLET: 12.5000 mg | ORAL_TABLET | Freq: Two times a day (BID) | ORAL | Status: DC

## 2018-01-07 MED ORDER — HEPARIN SODIUM (PORCINE) 1000 UNIT/ML IJ SOLN
INTRAMUSCULAR | Status: AC
  Filled 2018-01-07: qty 1

## 2018-01-07 MED ORDER — METOPROLOL TARTRATE 25 MG/10 ML ORAL SUSPENSION: 12.5000 mg | Freq: Two times a day (BID) | ORAL | Status: DC

## 2018-01-07 MED ORDER — NITROGLYCERIN IN D5W 200-5 MCG/ML-% IV SOLN: 0.0000 ug/min | INTRAVENOUS | Status: DC

## 2018-01-07 MED ORDER — ALBUMIN HUMAN 5 % IV SOLN
INTRAVENOUS | Status: DC | PRN
  Administered 2018-01-07 (×2): via INTRAVENOUS

## 2018-01-07 MED ORDER — PROPOFOL 10 MG/ML IV BOLUS
INTRAVENOUS | Status: AC
  Filled 2018-01-07: qty 20

## 2018-01-07 MED ORDER — ACETAMINOPHEN 160 MG/5ML PO SOLN
1000.0000 mg | Freq: Four times a day (QID) | ORAL | Status: DC
  Administered 2018-01-07: 1000 mg
  Filled 2018-01-07 (×2): qty 40.6

## 2018-01-07 MED ORDER — INSULIN REGULAR BOLUS VIA INFUSION
0.0000 [IU] | Freq: Three times a day (TID) | INTRAVENOUS | Status: DC
  Filled 2018-01-07: qty 10

## 2018-01-07 MED ORDER — SODIUM CHLORIDE 0.9% FLUSH: 3.0000 mL | INTRAVENOUS | Status: DC | PRN

## 2018-01-07 MED ORDER — POTASSIUM CHLORIDE 10 MEQ/50ML IV SOLN: 10.0000 meq | INTRAVENOUS | Status: AC

## 2018-01-07 MED ORDER — PHENYLEPHRINE 40 MCG/ML (10ML) SYRINGE FOR IV PUSH (FOR BLOOD PRESSURE SUPPORT)
PREFILLED_SYRINGE | INTRAVENOUS | Status: AC
  Filled 2018-01-07: qty 10

## 2018-01-07 MED ORDER — ROCURONIUM BROMIDE 10 MG/ML (PF) SYRINGE
PREFILLED_SYRINGE | INTRAVENOUS | Status: DC | PRN
Start: 2018-01-07 — End: 2018-01-07
  Administered 2018-01-07 (×3): 50 mg via INTRAVENOUS

## 2018-01-07 MED ORDER — SODIUM CHLORIDE 0.9 % IV SOLN: INTRAVENOUS | Status: DC

## 2018-01-07 MED ORDER — METOPROLOL TARTRATE 12.5 MG HALF TABLET
12.5000 mg | ORAL_TABLET | Freq: Once | ORAL | Status: AC
  Administered 2018-01-07: 12.5 mg via ORAL

## 2018-01-07 MED ORDER — LACTATED RINGERS IV SOLN
INTRAVENOUS | Status: DC | PRN
  Administered 2018-01-07 (×2): via INTRAVENOUS

## 2018-01-07 MED ORDER — DEXTROSE 50 % IV SOLN
13.0000 mL | Freq: Once | INTRAVENOUS | Status: AC
  Administered 2018-01-07: 13 mL via INTRAVENOUS

## 2018-01-07 MED ORDER — SODIUM CHLORIDE 0.45 % IV SOLN
INTRAVENOUS | Status: DC | PRN
  Administered 2018-01-07: 20 mL/h via INTRAVENOUS

## 2018-01-07 MED ORDER — SODIUM CHLORIDE 0.9 % IV SOLN
INTRAVENOUS | Status: DC
  Administered 2018-01-07: 20 mL/h via INTRAVENOUS

## 2018-01-07 MED ORDER — PROTAMINE SULFATE 10 MG/ML IV SOLN
INTRAVENOUS | Status: AC
  Filled 2018-01-07: qty 25

## 2018-01-07 MED ORDER — DEXTROSE 50 % IV SOLN
INTRAVENOUS | Status: AC
  Administered 2018-01-07: 13 mL via INTRAVENOUS
  Filled 2018-01-07: qty 50

## 2018-01-07 MED ORDER — MIDAZOLAM HCL 10 MG/2ML IJ SOLN
INTRAMUSCULAR | Status: AC
  Filled 2018-01-07: qty 2

## 2018-01-07 MED ORDER — MORPHINE SULFATE (PF) 2 MG/ML IV SOLN: 1.0000 mg | INTRAVENOUS | Status: DC | PRN

## 2018-01-07 MED ORDER — CHLORHEXIDINE GLUCONATE 0.12 % MT SOLN
15.0000 mL | OROMUCOSAL | Status: AC
  Administered 2018-01-07: 15 mL via OROMUCOSAL

## 2018-01-07 MED ORDER — ACETAMINOPHEN 500 MG PO TABS
1000.0000 mg | ORAL_TABLET | Freq: Four times a day (QID) | ORAL | Status: AC
  Administered 2018-01-08 – 2018-01-12 (×17): 1000 mg via ORAL
  Filled 2018-01-07 (×18): qty 2

## 2018-01-07 MED ORDER — LACTATED RINGERS IV SOLN: 500.0000 mL | Freq: Once | INTRAVENOUS | Status: DC | PRN

## 2018-01-07 MED ORDER — BISACODYL 5 MG PO TBEC
10.0000 mg | DELAYED_RELEASE_TABLET | Freq: Every day | ORAL | Status: DC
  Administered 2018-01-08 – 2018-01-12 (×5): 10 mg via ORAL
  Filled 2018-01-07 (×5): qty 2

## 2018-01-07 MED ORDER — MIDAZOLAM HCL 5 MG/5ML IJ SOLN
INTRAMUSCULAR | Status: DC | PRN
  Administered 2018-01-07: 2 mg via INTRAVENOUS
  Administered 2018-01-07 (×2): 1 mg via INTRAVENOUS
  Administered 2018-01-07 (×2): 2 mg via INTRAVENOUS

## 2018-01-07 MED ORDER — SODIUM CHLORIDE 0.9 % IV SOLN
0.0000 ug/min | INTRAVENOUS | Status: DC
  Administered 2018-01-07: 35 ug/min via INTRAVENOUS
  Administered 2018-01-08: 50 ug/min via INTRAVENOUS
  Filled 2018-01-07: qty 2
  Filled 2018-01-07: qty 20

## 2018-01-07 MED ORDER — ROCURONIUM BROMIDE 10 MG/ML (PF) SYRINGE
PREFILLED_SYRINGE | INTRAVENOUS | Status: AC
  Filled 2018-01-07: qty 10

## 2018-01-07 MED ORDER — 0.9 % SODIUM CHLORIDE (POUR BTL) OPTIME
TOPICAL | Status: DC | PRN
  Administered 2018-01-07: 4000 mL

## 2018-01-07 MED ORDER — SODIUM CHLORIDE 0.9 % IV SOLN
INTRAVENOUS | Status: AC
  Administered 2018-01-07: 100 mL/h via INTRAVENOUS

## 2018-01-07 MED ORDER — ACETAMINOPHEN 160 MG/5ML PO SOLN: 650.0000 mg | Freq: Once | ORAL | Status: AC

## 2018-01-07 MED ORDER — METOPROLOL TARTRATE 5 MG/5ML IV SOLN: 2.5000 mg | INTRAVENOUS | Status: DC | PRN

## 2018-01-07 MED ORDER — CHLORHEXIDINE GLUCONATE 4 % EX LIQD: 30.0000 mL | CUTANEOUS | Status: DC

## 2018-01-07 MED ORDER — CHLORHEXIDINE GLUCONATE 0.12 % MT SOLN
15.0000 mL | Freq: Once | OROMUCOSAL | Status: AC
  Administered 2018-01-07: 15 mL via OROMUCOSAL

## 2018-01-07 MED ORDER — LACTATED RINGERS IV SOLN
INTRAVENOUS | Status: DC
  Administered 2018-01-07: 10 mL/h via INTRAVENOUS

## 2018-01-07 MED ORDER — CHLORHEXIDINE GLUCONATE 0.12 % MT SOLN
OROMUCOSAL | Status: AC
  Administered 2018-01-07: 15 mL via OROMUCOSAL
  Filled 2018-01-07: qty 15

## 2018-01-07 MED ORDER — TRAMADOL HCL 50 MG PO TABS
50.0000 mg | ORAL_TABLET | ORAL | Status: DC | PRN
  Administered 2018-01-10 – 2018-01-13 (×4): 100 mg via ORAL
  Filled 2018-01-07 (×4): qty 2

## 2018-01-07 MED ORDER — PHENYLEPHRINE HCL 10 MG/ML IJ SOLN
INTRAMUSCULAR | Status: DC | PRN
  Administered 2018-01-07: 40 ug via INTRAVENOUS

## 2018-01-07 MED ORDER — ORAL CARE MOUTH RINSE
15.0000 mL | OROMUCOSAL | Status: DC
  Administered 2018-01-07 – 2018-01-08 (×5): 15 mL via OROMUCOSAL

## 2018-01-07 MED ORDER — SODIUM CHLORIDE 0.9 % IV SOLN
1.5000 g | Freq: Two times a day (BID) | INTRAVENOUS | Status: AC
  Administered 2018-01-07 – 2018-01-09 (×4): 1.5 g via INTRAVENOUS
  Filled 2018-01-07 (×4): qty 1.5

## 2018-01-07 MED ORDER — ASPIRIN EC 325 MG PO TBEC
325.0000 mg | DELAYED_RELEASE_TABLET | Freq: Every day | ORAL | Status: DC
Start: 2018-01-08 — End: 2018-01-09
  Administered 2018-01-08: 325 mg via ORAL
  Filled 2018-01-07 (×2): qty 1

## 2018-01-07 MED ORDER — BUPIVACAINE LIPOSOME 1.3 % IJ SUSP
20.0000 mL | Freq: Once | INTRAMUSCULAR | Status: DC
  Filled 2018-01-07: qty 20

## 2018-01-07 MED ORDER — SODIUM BICARBONATE 8.4 % IV SOLN
100.0000 meq | Freq: Once | INTRAVENOUS | Status: AC
  Administered 2018-01-07: 100 meq via INTRAVENOUS

## 2018-01-07 MED ORDER — ONDANSETRON HCL 4 MG/2ML IJ SOLN
4.0000 mg | Freq: Four times a day (QID) | INTRAMUSCULAR | Status: DC | PRN
  Administered 2018-01-08 – 2018-01-12 (×2): 4 mg via INTRAVENOUS
  Filled 2018-01-07 (×2): qty 2

## 2018-01-07 MED ORDER — ACETAMINOPHEN 650 MG RE SUPP
650.0000 mg | Freq: Once | RECTAL | Status: AC
  Administered 2018-01-07: 650 mg via RECTAL

## 2018-01-07 SURGICAL SUPPLY — 112 items
ADAPTER CARDIO PERF ANTE/RETRO (ADAPTER) ×4 IMPLANT
ADH SKN CLS APL DERMABOND .7 (GAUZE/BANDAGES/DRESSINGS) ×3
ADPR PRFSN 84XANTGRD RTRGD (ADAPTER) ×3
BAG DECANTER FOR FLEXI CONT (MISCELLANEOUS) ×8 IMPLANT
BLADE SURG 11 STRL SS (BLADE) ×5 IMPLANT
CANISTER SUCT 3000ML PPV (MISCELLANEOUS) ×8 IMPLANT
CANNULA FEM VENOUS REMOTE 22FR (CANNULA) ×1 IMPLANT
CANNULA FEMORAL ART 14 SM (MISCELLANEOUS) ×4 IMPLANT
CANNULA GUNDRY RCSP 15FR (MISCELLANEOUS) ×4 IMPLANT
CANNULA OPTISITE PERFUSION 16F (CANNULA) IMPLANT
CANNULA OPTISITE PERFUSION 18F (CANNULA) ×2 IMPLANT
CANNULA SUMP PERICARDIAL (CANNULA) ×8 IMPLANT
CATH KIT ON Q 5IN SLV (PAIN MANAGEMENT) IMPLANT
CELLS DAT CNTRL 66122 CELL SVR (MISCELLANEOUS) ×3 IMPLANT
CONN ST 1/4X3/8  BEN (MISCELLANEOUS) ×2
CONN ST 1/4X3/8 BEN (MISCELLANEOUS) ×6 IMPLANT
CONNECTOR 1/2X3/8X1/2 3 WAY (MISCELLANEOUS) ×1
CONNECTOR 1/2X3/8X1/2 3WAY (MISCELLANEOUS) ×3 IMPLANT
CONT SPEC 4OZ CLIKSEAL STRL BL (MISCELLANEOUS) ×6 IMPLANT
COVER BACK TABLE 24X17X13 BIG (DRAPES) ×4 IMPLANT
CRADLE DONUT ADULT HEAD (MISCELLANEOUS) ×4 IMPLANT
DERMABOND ADVANCED (GAUZE/BANDAGES/DRESSINGS) ×1
DERMABOND ADVANCED .7 DNX12 (GAUZE/BANDAGES/DRESSINGS) ×6 IMPLANT
DEVICE PMI PUNCTURE CLOSURE (MISCELLANEOUS) ×4 IMPLANT
DEVICE SUT CK QUICK LOAD INDV (Prosthesis & Implant Heart) ×2 IMPLANT
DEVICE SUT CK QUICK LOAD MINI (Prosthesis & Implant Heart) ×2 IMPLANT
DEVICE TROCAR PUNCTURE CLOSURE (ENDOMECHANICALS) ×4 IMPLANT
DRAIN CHANNEL 28F RND 3/8 FF (WOUND CARE) ×8 IMPLANT
DRAPE BILATERAL SPLIT (DRAPES) ×4 IMPLANT
DRAPE C-ARM 42X72 X-RAY (DRAPES) ×4 IMPLANT
DRAPE CV SPLIT W-CLR ANES SCRN (DRAPES) ×4 IMPLANT
DRAPE HALF SHEET 40X57 (DRAPES) ×2 IMPLANT
DRAPE INCISE IOBAN 66X45 STRL (DRAPES) ×12 IMPLANT
DRAPE SLUSH/WARMER DISC (DRAPES) ×4 IMPLANT
DRSG COVADERM 4X10 (GAUZE/BANDAGES/DRESSINGS) ×2 IMPLANT
DRSG COVADERM 4X8 (GAUZE/BANDAGES/DRESSINGS) ×4 IMPLANT
ELECT BLADE 6.5 EXT (BLADE) ×4 IMPLANT
ELECT REM PT RETURN 9FT ADLT (ELECTROSURGICAL) ×8
ELECTRODE REM PT RTRN 9FT ADLT (ELECTROSURGICAL) ×6 IMPLANT
FELT TEFLON 1X6 (MISCELLANEOUS) ×8 IMPLANT
FEMORAL VENOUS CANN RAP (CANNULA) IMPLANT
GAUZE SPONGE 4X4 12PLY STRL (GAUZE/BANDAGES/DRESSINGS) ×2 IMPLANT
GAUZE SPONGE 4X4 12PLY STRL LF (GAUZE/BANDAGES/DRESSINGS) ×4 IMPLANT
GLOVE BIO SURGEON STRL SZ 6 (GLOVE) ×6 IMPLANT
GLOVE BIO SURGEON STRL SZ 6.5 (GLOVE) ×18 IMPLANT
GLOVE BIOGEL PI IND STRL 8 (GLOVE) IMPLANT
GLOVE BIOGEL PI INDICATOR 8 (GLOVE) ×2
GLOVE ECLIPSE 8.0 STRL XLNG CF (GLOVE) ×12 IMPLANT
GLOVE ORTHO TXT STRL SZ7.5 (GLOVE) ×12 IMPLANT
GOWN STRL REUS W/ TWL LRG LVL3 (GOWN DISPOSABLE) ×12 IMPLANT
GOWN STRL REUS W/TWL LRG LVL3 (GOWN DISPOSABLE) ×16
IV NS 1000ML (IV SOLUTION) ×4
IV NS 1000ML BAXH (IV SOLUTION) ×2 IMPLANT
IV NS IRRIG 3000ML ARTHROMATIC (IV SOLUTION) ×2 IMPLANT
KIT BASIN OR (CUSTOM PROCEDURE TRAY) ×4 IMPLANT
KIT DILATOR VASC 18G NDL (KITS) ×4 IMPLANT
KIT DRAINAGE VACCUM ASSIST (KITS) ×1 IMPLANT
KIT SUCTION CATH 14FR (SUCTIONS) ×4 IMPLANT
KIT SUT CK MINI COMBO 4X17 (Prosthesis & Implant Heart) ×1 IMPLANT
KIT TURNOVER KIT B (KITS) ×4 IMPLANT
LEAD PACING MYOCARDI (MISCELLANEOUS) ×4 IMPLANT
LINE VENT (MISCELLANEOUS) ×2 IMPLANT
NDL AORTIC ROOT 14G 7F (CATHETERS) ×2 IMPLANT
NEEDLE AORTIC ROOT 14G 7F (CATHETERS) ×4 IMPLANT
NS IRRIG 1000ML POUR BTL (IV SOLUTION) ×20 IMPLANT
PACK OPEN HEART (CUSTOM PROCEDURE TRAY) ×4 IMPLANT
PAD ARMBOARD 7.5X6 YLW CONV (MISCELLANEOUS) ×8 IMPLANT
PAD ELECT DEFIB RADIOL ZOLL (MISCELLANEOUS) ×4 IMPLANT
RETRACTOR WND ALEXIS 18 MED (MISCELLANEOUS) ×2 IMPLANT
RING MITRAL MEMO 4D 28 (Prosthesis & Implant Heart) ×2 IMPLANT
RTRCTR WOUND ALEXIS 18CM MED (MISCELLANEOUS) ×4
SET CANNULATION TOURNIQUET (MISCELLANEOUS) ×4 IMPLANT
SET CARDIOPLEGIA MPS 5001102 (MISCELLANEOUS) ×2 IMPLANT
SET IRRIG TUBING LAPAROSCOPIC (IRRIGATION / IRRIGATOR) ×4 IMPLANT
SOLUTION ANTI FOG 6CC (MISCELLANEOUS) ×4 IMPLANT
STOPCOCK 4 WAY LG BORE MALE ST (IV SETS) ×1 IMPLANT
SUT BONE WAX W31G (SUTURE) ×4 IMPLANT
SUT E-PACK MINIMALLY INVASIVE (SUTURE) ×4 IMPLANT
SUT ETHIBOND (SUTURE) ×2 IMPLANT
SUT ETHIBOND 2 0 SH (SUTURE) ×1 IMPLANT
SUT ETHIBOND 2-0 RB-1 WHT (SUTURE) ×4 IMPLANT
SUT ETHIBOND X763 2 0 SH 1 (SUTURE) ×4 IMPLANT
SUT GORETEX CV 4 TH 22 36 (SUTURE) ×4 IMPLANT
SUT GORETEX CV-5THC-13 36IN (SUTURE) ×9 IMPLANT
SUT GORETEX CV4 TH-18 (SUTURE) ×8 IMPLANT
SUT PROLENE 3 0 SH1 36 (SUTURE) ×20 IMPLANT
SUT PROLENE 4 0 RB 1 (SUTURE) ×8
SUT PROLENE 4-0 RB1 .5 CRCL 36 (SUTURE) ×2 IMPLANT
SUT PROLENE 6 0 C 1 30 (SUTURE) ×1 IMPLANT
SUT PTFE CHORD X 20MM (SUTURE) ×4 IMPLANT
SUT SILK  1 MH (SUTURE) ×1
SUT SILK 1 MH (SUTURE) ×1 IMPLANT
SUT SILK 2 0 SH CR/8 (SUTURE) IMPLANT
SUT SILK 3 0 SH CR/8 (SUTURE) IMPLANT
SUT VIC AB 2-0 CTX 36 (SUTURE) IMPLANT
SUT VIC AB 3-0 SH 8-18 (SUTURE) ×2 IMPLANT
SUT VICRYL 2 TP 1 (SUTURE) IMPLANT
SYR 10ML LL (SYRINGE) ×4 IMPLANT
SYR 50ML SLIP (SYRINGE) ×1 IMPLANT
SYSTEM SAHARA CHEST DRAIN ATS (WOUND CARE) ×4 IMPLANT
TAPE CLOTH SURG 4X10 WHT LF (GAUZE/BANDAGES/DRESSINGS) ×2 IMPLANT
TAPE PAPER 2X10 WHT MICROPORE (GAUZE/BANDAGES/DRESSINGS) ×1 IMPLANT
TOWEL GREEN STERILE (TOWEL DISPOSABLE) ×4 IMPLANT
TOWEL GREEN STERILE FF (TOWEL DISPOSABLE) ×4 IMPLANT
TRAY FOLEY SLVR 16FR TEMP STAT (SET/KITS/TRAYS/PACK) ×4 IMPLANT
TROCAR XCEL BLADELESS 5X75MML (TROCAR) ×4 IMPLANT
TROCAR XCEL NON-BLD 11X100MML (ENDOMECHANICALS) ×8 IMPLANT
TUBE SUCT INTRACARD DLP 20F (MISCELLANEOUS) ×4 IMPLANT
TUNNELER SHEATH ON-Q 11GX8 DSP (PAIN MANAGEMENT) IMPLANT
UNDERPAD 30X30 (UNDERPADS AND DIAPERS) ×4 IMPLANT
WATER STERILE IRR 1000ML POUR (IV SOLUTION) ×8 IMPLANT
WIRE .035 3MM-J 145CM (WIRE) ×4 IMPLANT

## 2018-01-07 NOTE — Interval H&P Note (Signed)
History and Physical Interval Note:  01/07/2018 5:55 AM  Brooke Chavez  has presented today for surgery, with the diagnosis of MR  The various methods of treatment have been discussed with the patient and family. After consideration of risks, benefits and other options for treatment, the patient has consented to  Procedure(s): MINIMALLY INVASIVE MITRAL VALVE REPAIR OR REPLACEMENT (MVR) (Right) TRANSESOPHAGEAL ECHOCARDIOGRAM (TEE) (N/A) as a surgical intervention .  The patient's history has been reviewed, patient examined, no change in status, stable for surgery.  I have reviewed the patient's chart and labs.  Questions were answered to the patient's satisfaction.     Rexene Alberts

## 2018-01-07 NOTE — Anesthesia Preprocedure Evaluation (Signed)
Anesthesia Evaluation  Patient identified by MRN, date of birth, ID band Patient awake    Reviewed: Allergy & Precautions, H&P , NPO status , Patient's Chart, lab work & pertinent test results  Airway Mallampati: II   Neck ROM: full    Dental   Pulmonary neg pulmonary ROS,    breath sounds clear to auscultation       Cardiovascular hypertension, +CHF  + dysrhythmias Atrial Fibrillation + Valvular Problems/Murmurs MR  Rhythm:regular Rate:Normal  S/p ablation for flutter.   TTE (09/2017): EF 45%. Severe MR. Mild TR. Dilated LA.  Global HK. No regional wall motion abnormalities.   Neuro/Psych    GI/Hepatic GERD  ,  Endo/Other    Renal/GU Renal InsufficiencyRenal disease     Musculoskeletal  (+) Arthritis ,   Abdominal   Peds  Hematology   Anesthesia Other Findings   Reproductive/Obstetrics                             Anesthesia Physical Anesthesia Plan  ASA: III  Anesthesia Plan: General   Post-op Pain Management:    Induction: Intravenous  PONV Risk Score and Plan: 3 and Ondansetron, Dexamethasone, Midazolam and Treatment may vary due to age or medical condition  Airway Management Planned: Double Lumen EBT  Additional Equipment: Arterial line, CVP, PA Cath, 3D TEE, TEE and Ultrasound Guidance Line Placement  Intra-op Plan:   Post-operative Plan: Post-operative intubation/ventilation  Informed Consent: I have reviewed the patients History and Physical, chart, labs and discussed the procedure including the risks, benefits and alternatives for the proposed anesthesia with the patient or authorized representative who has indicated his/her understanding and acceptance.     Plan Discussed with: CRNA, Anesthesiologist and Surgeon  Anesthesia Plan Comments:         Anesthesia Quick Evaluation

## 2018-01-07 NOTE — Brief Op Note (Signed)
01/07/2018  12:50 PM  PATIENT:  Brooke Chavez  77 y.o. female  PRE-OPERATIVE DIAGNOSIS:  Severe MR  POST-OPERATIVE DIAGNOSIS:  Severe MR  PROCEDURE: TRANSESOPHAGEAL ECHOCARDIOGRAM (TEE)    Minimally-Invasive Maze Procedure Serial # [id number] Annuloplasty ring size 28 mm) Closure of PFO  SURGEON:  Surgeon(s) and Role:    Rexene Alberts, MD - Primary  PHYSICIAN ASSISTANT: Lars Pinks PA-C  ASSISTANTS: Ara Kussmaul RNFA   ANESTHESIA:   general  EBL:  850   DRAINS: Chest tubes placed in the mediastinal and right pleural space   COUNTS CORRECT:  YES  DICTATION: .Dragon Dictation  PLAN OF CARE: Discharge to home after PACU  PATIENT DISPOSITION:  ICU - intubated and hemodynamically stable.   Delay start of Pharmacological VTE agent (>24hrs) due to surgical blood loss or risk of bleeding: yes

## 2018-01-07 NOTE — Anesthesia Procedure Notes (Signed)
Arterial Line Insertion Start/End5/30/2019 7:10 AM, 01/07/2018 7:10 AM  Patient location: Pre-op. Preanesthetic checklist: patient identified, IV checked, site marked, risks and benefits discussed, surgical consent, monitors and equipment checked, pre-op evaluation, timeout performed and anesthesia consent Lidocaine 1% used for infiltration Left, radial was placed Catheter size: 20 Fr Hand hygiene performed  and maximum sterile barriers used   Attempts: 1 Procedure performed without using ultrasound guided technique. Following insertion, dressing applied. Post procedure assessment: normal and unchanged

## 2018-01-07 NOTE — OR Nursing (Signed)
12:47 - 20 minute call to SICU charge nurse

## 2018-01-07 NOTE — Progress Notes (Signed)
Patient ID: Brooke Chavez, female   DOB: 1941-07-10, 77 y.o.   MRN: [id number]  TCTS Evening Rounds:   Hemodynamically stable  CI = 2.7 on neo only  Has started to wake up on vent. Weaning vent  Urine output good  CT output low  CBC    Component Value Date/Time   WBC 17.3 (H) 01/07/2018 1332   RBC 3.45 (L) 01/07/2018 1332   HGB 9.2 (L) 01/07/2018 1853   HGB 14.2 09/03/2017 1509   HCT 27.0 (L) 01/07/2018 1853   HCT 42.9 09/03/2017 1509   PLT PLATELET CLUMPS NOTED ON SMEAR, UNABLE TO ESTIMATE 01/07/2018 1332   PLT 250 09/03/2017 1509   MCV 95.4 01/07/2018 1332   MCV 95 09/03/2017 1509   MCH 31.3 01/07/2018 1332   MCHC 32.8 01/07/2018 1332   RDW 13.2 01/07/2018 1332   RDW 14.6 09/03/2017 1509   LYMPHSABS 1.7 11/11/2017 0936   MONOABS 0.3 11/11/2017 0936   EOSABS 0.1 11/11/2017 0936   BASOSABS 0.0 11/11/2017 0936     BMET    Component Value Date/Time   NA 141 01/07/2018 1853   NA 142 11/03/2017 1507   K 5.3 (H) 01/07/2018 1853   CL 106 01/07/2018 1853   CO2 20 (L) 01/05/2018 1242   GLUCOSE 122 (H) 01/07/2018 1853   BUN 17 01/07/2018 1853   BUN 23 11/03/2017 1507   CREATININE 1.20 (H) 01/07/2018 1853   CREATININE 1.14 (H) 12/07/2012 1655   CALCIUM 9.5 01/05/2018 1242   GFRNONAA 30 (L) 01/05/2018 1242   GFRNONAA 48 (L) 12/07/2012 1655   GFRAA 34 (L) 01/05/2018 1242   GFRAA 56 (L) 12/07/2012 1655     A/P:  Stable postop course. Continue current plans

## 2018-01-07 NOTE — Progress Notes (Signed)
PT failed parameters  NIF -5 x 3 tries  PT back on full support

## 2018-01-07 NOTE — Progress Notes (Signed)
NIF (-12) and VC (350L) obtain with fair effort from pt.

## 2018-01-07 NOTE — Progress Notes (Signed)
  Echocardiogram 2D Echocardiogram has been performed.  Brooke Chavez 01/07/2018, 8:23 AM

## 2018-01-07 NOTE — Progress Notes (Signed)
Patient did not meet parameters per rapid wean protocol. Dr Cyndia Bent notified of ABG results, NIF of -12 and VC of 350. MD requested to wean patient again when more awake. Will continue to monitor.

## 2018-01-07 NOTE — Transfer of Care (Signed)
Immediate Anesthesia Transfer of Care Note  Patient: Genice M Emig  Procedure(s) Performed: MINIMALLY INVASIVE MITRAL VALVE REPAIR using LivaNova ring size 28 MM (Right Chest) TRANSESOPHAGEAL ECHOCARDIOGRAM (TEE) (N/A ) PATENT FORAMEN OVALE (PFO) CLOSURE (N/A Chest)  Patient Location: SICU  Anesthesia Type:General  Level of Consciousness: Patient remains intubated per anesthesia plan  Airway & Oxygen Therapy: Patient remains intubated per anesthesia plan  Post-op Assessment: Report given to RN and Post -op Vital signs reviewed and stable  Post vital signs: Reviewed and stable  Last Vitals:  Vitals Value Taken Time  BP    Temp 35.1 C 01/07/2018  1:46 PM  Pulse 80 01/07/2018  1:46 PM  Resp 0 01/07/2018  1:46 PM  SpO2 99 % 01/07/2018  1:46 PM  Vitals shown include unvalidated device data.  Last Pain:  Vitals:   01/07/18 0601  PainSc: 0-No pain      Patients Stated Pain Goal: 4 (75/[id number])  Complications: No apparent anesthesia complications

## 2018-01-07 NOTE — Anesthesia Procedure Notes (Signed)
Central Venous Catheter Insertion Performed by: Lillia Abed, MD, anesthesiologist Start/End5/30/2019 6:30 AM, 01/07/2018 6:40 AM Patient location: Pre-op. Preanesthetic checklist: patient identified, IV checked, risks and benefits discussed, surgical consent, monitors and equipment checked, pre-op evaluation, timeout performed and anesthesia consent Position: Trendelenburg Lidocaine 1% used for infiltration and patient sedated Hand hygiene performed  and maximum sterile barriers used  Catheter size: 8.5 Fr PA cath and Central line was placed.Sheath introducer Swan type:thermodilution PA Cath depth:48 Procedure performed using ultrasound guided technique. Ultrasound Notes:anatomy identified, needle tip was noted to be adjacent to the nerve/plexus identified, no ultrasound evidence of intravascular and/or intraneural injection and image(s) printed for medical record Attempts: 1 Following insertion, line sutured, dressing applied and Biopatch. Post procedure assessment: blood return through all ports, free fluid flow and no air  Patient tolerated the procedure well with no immediate complications.

## 2018-01-07 NOTE — OR Nursing (Signed)
12:20 - 45 minute call to SICU charge nurse

## 2018-01-07 NOTE — Op Note (Signed)
CARDIOTHORACIC SURGERY OPERATIVE NOTE  Date of Procedure:  01/07/2018  Preoperative Diagnosis:   Severe Mitral Regurgitation  Patent Foramen Ovale  Postoperative Diagnosis: Same  Procedure:    Minimally-Invasive Mitral Valve Repair  Complex valvuloplasty including artificial Gore-tex neochord placement x6  Sorin Memo 4D Ring Annuloplasty (size 42mm, catalog # [id number], serial # [id number])  Closure of patent foramen ovale    Surgeon: Valentina Gu. 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  Patient's cardiac history dates back several years ago to 2011 when she was diagnosed with atrial flutter. She underwent cardioversion and later underwent catheter-based ablation in 2012. She has been followed regularly ever since by Dr. Percival Spanish. Previous echocardiograms have demonstrated the presence of normal left ventricular systolic function with mitral valve prolapse and what was felt to be moderate mitral regurgitation. Follow-up transthoracic echocardiogram performed June 12, 2017 revealed significant change in left ventricular systolic function with ejection fraction estimated at only 45-50%. She subsequently underwent  transesophageal echocardiogram on September 18, 2017 which revealed similar findings, with mild left ventricular systolic dysfunction and ejection fraction estimated 45-50%. There was mitral valve prolapse with severe mitral regurgitation confirmed by presence of systolic flow reversal in the pulmonary veins. There was severe left atrial enlargement. There was a patent foramen ovale. Right ventricular size and function was normal. The right atrium was dilated. There was only mild tricuspid regurgitation. Cardiothoracic surgical consultation was requested.  The patient has been seen in consultation and counseled at length regarding the indications, risks and potential benefits of surgery.  All questions have been answered, and the patient provides full informed consent for the operation as described.   DETAILS OF THE OPERATIVE PROCEDURE  Preparation:  The patient is brought to the operating room on the above mentioned date and central monitoring was established by the anesthesia team including placement of Swan-Ganz catheter through the left internal jugular vein.  A radial arterial line is placed. The patient is placed in the supine position on the operating table.  Intravenous antibiotics are administered. General endotracheal anesthesia is induced uneventfully. The patient is initially intubated using a dual lumen endotracheal tube.  A Foley catheter is placed.  Baseline transesophageal echocardiogram was performed.  Findings were notable for severe mitral regurgitation.  Left ventricle was moderately dilated.  There was prolapse involving the middle scallop of the anterior leaflet of the mitral valve.  The jet of regurgitation was directed posteriorly around the enlarged left atrium.  There was normal left ventricular systolic function.  There was normal right ventricular size and function.  There was mild tricuspid regurgitation.  A soft roll is placed behind the patient's left scapula and the  neck gently extended and turned to the left.   The patient's right neck, chest, abdomen, both groins, and both lower extremities are prepared and  draped in a sterile manner. A time out procedure is performed.  Surgical Approach:  A right miniature anterolateral thoracotomy incision is performed. The incision is placed just lateral to and superior to the right nipple. The pectoralis major muscle is retracted medially and completely preserved. The right pleural space is entered through the 3rd intercostal space. A soft tissue retractor is placed.  Two 11 mm ports are placed through separate stab incisions inferiorly. The right pleural space is insufflated continuously with carbon dioxide gas through the posterior port during the remainder of the operation.  A pledgeted sutures placed through the dome of the right hemidiaphragm and retracted inferiorly to facilitate exposure.  A longitudinal incision is made in the pericardium 3 cm anterior to the phrenic nerve and silk traction sutures are placed on either side of the incision for exposure.   Extracorporeal Cardiopulmonary Bypass and Myocardial Protection:  A small incision is made in the right inguinal crease and the anterior surface of the right common femoral artery and right common femoral vein are identified.  The patient is placed in Trendelenburg position. The right internal jugular vein is cannulated with Seldinger technique and a guidewire advanced into the right atrium. The patient is heparinized systemically. The right internal jugular vein is cannulated with a 14 [place] pediatric femoral venous cannula. Pursestring sutures are placed on the anterior surface of the right common femoral vein and right common femoral artery. The right common femoral vein is cannulated with the Seldinger technique and a guidewire is advanced under transesophageal echocardiogram guidance through the right atrium. The femoral vein is cannulated with a long 22 French  femoral venous cannula. The right common femoral artery is cannulated with Seldinger technique and a flexible guidewire is advanced until it can be appreciated intraluminally in the descending thoracic aorta on transesophageal echocardiogram. The femoral artery is cannulated with an 18 French femoral arterial cannula.  Adequate heparinization is verified.     The entire pre-bypass portion of the operation was notable for stable hemodynamics.  Cardiopulmonary bypass was begun.  Vacuum assist venous drainage is utilized. The incision in the pericardium is extended in both directions. Venous drainage and exposure are notably excellent. A retrograde cardioplegia cannula is placed through the right atrium into the coronary sinus using transesophageal echocardiogram guidance.  An antegrade cardioplegia cannula is placed in the ascending aorta.    The patient is cooled to 28C systemic temperature.  The aortic cross clamp is applied and cardioplegia is delivered initially in an antegrade fashion through the aortic root using modified [place] cold blood cardioplegia ([place] blood cardioplegia protocol).   The initial cardioplegic arrest is rapid with early diastolic arrest.  Repeat doses of cardioplegia are administered at 90 minutes and every 30 minutes thereafter through the coronary sinus catheter in order to maintain completely flat electrocardiogram.  Myocardial protection was felt to be excellent.   Closure of Patent Foramen Ovale:  A left atriotomy incision was performed through the interatrial groove and extended partially across the back wall of the left atrium after opening the oblique sinus inferiorly.  There was an obvious patent foramen ovale appreciated.  The patent foramen ovale was closed using simple running 4-0 Prolene suture.   Mitral Valve Repair:  The mitral valve is exposed using a self-retaining retractor.  The mitral valve was inspected and notable for fibroelastic deficiency  type degenerative disease.  There were multiple elongated primary chordae tendinae to the middle scallop of the anterior leaflet.  There were no  ruptured chordae tendinae.  The mitral annulus was dilated.  There was no calcification.  The posterior leaflet was relatively normal in appearance..  Interrupted 2-0 Ethibond horizontal mattress sutures are placed circumferentially around the entire mitral valve annulus. The sutures will ultimately be utilized for ring annuloplasty, and at this juncture there are utilized to suspend the valve symmetrically.  Artificial neochord placement was performed using Chord-X multi-strand CV-4 Goretex pre-measured loops.  The appropriate cord length was measured from corresponding normal length primary cords from the A1 segment of the anterior leaflet. The papillary muscle suture of the first Chord-X multi-strand suture was placed through the head of the anterior papillary muscle in a horizontal mattress fashion and tied over Teflon felt pledgets. Each of the three pre-measured loops were then reimplanted into the free margin of the A2 segment of the anterior leaflet.  The papillary muscle suture of a second Chord-X multi-strand suture was placed through the head of the posterior papillary muscle in a horizontal mattress fashion and tied over Teflon felt pledgets. Each of the three pre-measured loops were then reimplanted into the free margin of the A2 segment of the anterior leaflet.  The valve was tested with saline and appeared competent even without ring annuloplasty complete.  However, the cords to the posterior papillary muscle appeared to be restrictive and over correcting.  These cords were all removed, leaving a total of 6 neo-cords placed only to the anterior papillary muscle.  The valve was sized to a 28 mm annuloplasty ring, based upon the transverse distance between the left and right commissures and the height of the anterior leaflet, corresponding to a size just  slightly larger than the overall surface area of the anterior leaflet.  A Sorin Memo 4D annuloplasty ring (size 70mm, catalog #[id number], serial [id number]) was secured in place uneventfully. All ring sutures were secured using a Cor-knot device.    The valve was tested with saline and appeared competent. There is no residual leak. There was a broad, symmetrical line of coaptation of the anterior and posterior leaflet which was confirmed using the blue ink test.  Rewarming is begun.   Procedure Completion:  The atriotomy was closed using a 2-layer closure of running 3-0 Prolene suture after placing a sump drain across the mitral valve to serve as a left ventricular vent.  One final dose of warm retrograde "reanimation dose" cardioplegia was administered retrograde through the coronary sinus catheter while all air was evacuated through the aortic root.  The aortic cross clamp was removed after a total cross clamp time of 95 minutes.  Epicardial pacing wires are fixed to the inferior wall of the right ventricule and to the right atrial appendage. The patient is rewarmed to 37C temperature. The left ventricular vent is removed.  The patient is ventilated and flow volumes turndown while the mitral valve repair is inspected using transesophageal echocardiogram. The valve repair appears intact with no residual leak. The antegrade cardioplegia cannula is now removed. The patient is weaned and disconnected from cardiopulmonary bypass.  The patient's rhythm at separation from bypass was AV paced.  The patient was weaned from bypass without any inotropic support. Total cardiopulmonary bypass time for the operation was 142 minutes.  Followup transesophageal echocardiogram performed after separation from bypass revealed a well-seated annuloplasty ring in the mitral position with a normal functioning mitral valve. There was no residual leak.  Left ventricular function was unchanged from preoperatively.  The mean gradient  across the mitral valve was estimated to  be 3 mmHg.  The femoral arterial and venous cannulae were removed uneventfully. There was a palpable pulse in the distal right common femoral artery after removal of the cannula. Protamine was administered to reverse the anticoagulation. The right internal jugular cannula was removed and manual pressure held on the neck for 15 minutes.  Single lung ventilation was begun. The atriotomy closure was inspected for hemostasis. The pericardial sac was drained using a 28 French Bard drain placed through the anterior port incision.  The right pleural space is irrigated with saline solution and inspected for hemostasis.  Liposomal bupivacaine is utilized to anesthetize the intercostal nerve bundles from the third, fourth, and fifth intercostal spaces.  The right pleural space was drained using a 28 French Bard drain placed through the posterior port incision. The miniature thoracotomy incision was closed in multiple layers in routine fashion. The right groin incision was inspected for hemostasis and closed in multiple layers in routine fashion.  The post-bypass portion of the operation was notable for stable rhythm and hemodynamics.  No blood products were administered during the operation.   Disposition:  The patient tolerated the procedure well.  The patient was reintubated using a single lumen endotracheal tube and subsequently transported to the surgical intensive care unit in stable condition. There were no intraoperative complications. All sponge instrument and needle counts are verified correct at completion of the operation.     Valentina Gu. Roxy Manns MD 01/07/2018 12:53 PM

## 2018-01-07 NOTE — Anesthesia Procedure Notes (Signed)
Procedure Name: Intubation Date/Time: 01/07/2018 7:50 AM Performed by: Clearnce Sorrel, CRNA Pre-anesthesia Checklist: Patient identified, Emergency Drugs available, Suction available, Patient being monitored and Timeout performed Patient Re-evaluated:Patient Re-evaluated prior to induction Oxygen Delivery Method: Circle system utilized Preoxygenation: Pre-oxygenation with 100% oxygen Induction Type: IV induction Ventilation: Mask ventilation without difficulty and Oral airway inserted - appropriate to patient size Laryngoscope Size: Mac and 3 Grade View: Grade I Endobronchial tube: Left and 37 Fr Number of attempts: 1 Airway Equipment and Method: Stylet Placement Confirmation: ETT inserted through vocal cords under direct vision,  positive ETCO2 and breath sounds checked- equal and bilateral Tube secured with: Tape Dental Injury: Teeth and Oropharynx as per pre-operative assessment

## 2018-01-08 ENCOUNTER — Encounter (HOSPITAL_COMMUNITY): Payer: Self-pay | Admitting: Thoracic Surgery (Cardiothoracic Vascular Surgery)

## 2018-01-08 ENCOUNTER — Inpatient Hospital Stay (HOSPITAL_COMMUNITY): Payer: Medicare Other

## 2018-01-08 LAB — CBC
HCT: 31.3 % — ABNORMAL LOW (ref 36.0–46.0)
HCT: 30.8 % — ABNORMAL LOW (ref 36.0–46.0)
Hemoglobin: 10.4 g/dL — ABNORMAL LOW (ref 12.0–15.0)
Hemoglobin: 10.1 g/dL — ABNORMAL LOW (ref 12.0–15.0)
MCH: 31.8 pg (ref 26.0–34.0)
MCH: 31.9 pg (ref 26.0–34.0)
MCHC: 33.2 g/dL (ref 30.0–36.0)
MCHC: 32.8 g/dL (ref 30.0–36.0)
MCV: 95.7 fL (ref 78.0–100.0)
MCV: 97.2 fL (ref 78.0–100.0)
Platelets: 92 10*3/uL — ABNORMAL LOW (ref 150–400)
Platelets: 91 10*3/uL — ABNORMAL LOW (ref 150–400)
RBC: 3.27 MIL/uL — ABNORMAL LOW (ref 3.87–5.11)
RBC: 3.17 MIL/uL — ABNORMAL LOW (ref 3.87–5.11)
RDW: 13.2 % (ref 11.5–15.5)
RDW: 13.3 % (ref 11.5–15.5)
WBC: 17.9 10*3/uL — ABNORMAL HIGH (ref 4.0–10.5)
WBC: 16.6 10*3/uL — ABNORMAL HIGH (ref 4.0–10.5)

## 2018-01-08 LAB — POCT I-STAT, CHEM 8
BUN: 13 mg/dL (ref 6–20)
Calcium, Ion: 1.16 mmol/L (ref 1.15–1.40)
Chloride: 104 mmol/L (ref 101–111)
Creatinine, Ser: 1.1 mg/dL — ABNORMAL HIGH (ref 0.44–1.00)
Glucose, Bld: 122 mg/dL — ABNORMAL HIGH (ref 65–99)
HCT: 31 % — ABNORMAL LOW (ref 36.0–46.0)
Hemoglobin: 10.5 g/dL — ABNORMAL LOW (ref 12.0–15.0)
Potassium: 4.6 mmol/L (ref 3.5–5.1)
Sodium: 138 mmol/L (ref 135–145)
TCO2: 21 mmol/L — ABNORMAL LOW (ref 22–32)

## 2018-01-08 LAB — POCT I-STAT 3, ART BLOOD GAS (G3+)
Acid-base deficit: 4 mmol/L — ABNORMAL HIGH (ref 0.0–2.0)
Acid-base deficit: 4 mmol/L — ABNORMAL HIGH (ref 0.0–2.0)
Bicarbonate: 20.1 mmol/L (ref 20.0–28.0)
Bicarbonate: 21.1 mmol/L (ref 20.0–28.0)
O2 Saturation: 99 %
O2 Saturation: 99 %
Patient temperature: 37.1
Patient temperature: 36.3
TCO2: 21 mmol/L — ABNORMAL LOW (ref 22–32)
TCO2: 22 mmol/L (ref 22–32)
pCO2 arterial: 30.4 mmHg — ABNORMAL LOW (ref 32.0–48.0)
pCO2 arterial: 34.7 mmHg (ref 32.0–48.0)
pH, Arterial: 7.429 (ref 7.350–7.450)
pH, Arterial: 7.389 (ref 7.350–7.450)
pO2, Arterial: 148 mmHg — ABNORMAL HIGH (ref 83.0–108.0)
pO2, Arterial: 157 mmHg — ABNORMAL HIGH (ref 83.0–108.0)

## 2018-01-08 LAB — BASIC METABOLIC PANEL
Anion gap: 5 (ref 5–15)
BUN: 14 mg/dL (ref 6–20)
CO2: 20 mmol/L — ABNORMAL LOW (ref 22–32)
Calcium: 7.4 mg/dL — ABNORMAL LOW (ref 8.9–10.3)
Chloride: 113 mmol/L — ABNORMAL HIGH (ref 101–111)
Creatinine, Ser: 1.34 mg/dL — ABNORMAL HIGH (ref 0.44–1.00)
GFR calc Af Amer: 43 mL/min — ABNORMAL LOW (ref >60)
GFR calc non Af Amer: 37 mL/min — ABNORMAL LOW (ref >60)
Glucose, Bld: 130 mg/dL — ABNORMAL HIGH (ref 65–99)
Potassium: 4.6 mmol/L (ref 3.5–5.1)
Sodium: 138 mmol/L (ref 135–145)

## 2018-01-08 LAB — GLUCOSE, CAPILLARY
Glucose-Capillary: 102 mg/dL — ABNORMAL HIGH (ref 65–99)
Glucose-Capillary: 110 mg/dL — ABNORMAL HIGH (ref 65–99)
Glucose-Capillary: 119 mg/dL — ABNORMAL HIGH (ref 65–99)
Glucose-Capillary: 125 mg/dL — ABNORMAL HIGH (ref 65–99)
Glucose-Capillary: 126 mg/dL — ABNORMAL HIGH (ref 65–99)
Glucose-Capillary: 133 mg/dL — ABNORMAL HIGH (ref 65–99)

## 2018-01-08 LAB — CREATININE, SERUM
Creatinine, Ser: 1.18 mg/dL — ABNORMAL HIGH (ref 0.44–1.00)
GFR calc Af Amer: 50 mL/min — ABNORMAL LOW (ref >60)
GFR calc non Af Amer: 43 mL/min — ABNORMAL LOW (ref >60)

## 2018-01-08 LAB — MAGNESIUM
Magnesium: 2.4 mg/dL (ref 1.7–2.4)
Magnesium: 2 mg/dL (ref 1.7–2.4)

## 2018-01-08 MED ORDER — FUROSEMIDE 10 MG/ML IJ SOLN
20.0000 mg | Freq: Four times a day (QID) | INTRAMUSCULAR | Status: AC
  Administered 2018-01-08 – 2018-01-09 (×3): 20 mg via INTRAVENOUS
  Filled 2018-01-08 (×3): qty 2

## 2018-01-08 MED ORDER — ENOXAPARIN SODIUM 30 MG/0.3ML ~~LOC~~ SOLN
30.0000 mg | Freq: Every day | SUBCUTANEOUS | Status: DC
  Administered 2018-01-09 – 2018-01-13 (×5): 30 mg via SUBCUTANEOUS
  Filled 2018-01-08 (×5): qty 0.3

## 2018-01-08 MED ORDER — WARFARIN - PHYSICIAN DOSING INPATIENT
Freq: Every day | Status: DC
  Administered 2018-01-11 – 2018-01-13 (×2)

## 2018-01-08 MED ORDER — PATIENT'S GUIDE TO USING COUMADIN BOOK
Freq: Once | Status: AC
  Administered 2018-01-10: 10:00:00
  Filled 2018-01-08 (×2): qty 1

## 2018-01-08 MED ORDER — AMIODARONE HCL IN DEXTROSE 360-4.14 MG/200ML-% IV SOLN
60.0000 mg/h | INTRAVENOUS | Status: AC
  Administered 2018-01-08: 60 mg/h via INTRAVENOUS
  Filled 2018-01-08: qty 400

## 2018-01-08 MED ORDER — AMIODARONE HCL IN DEXTROSE 360-4.14 MG/200ML-% IV SOLN
30.0000 mg/h | INTRAVENOUS | Status: DC
  Administered 2018-01-08: 30 mg/h via INTRAVENOUS
  Filled 2018-01-08: qty 200

## 2018-01-08 MED ORDER — WARFARIN SODIUM 2.5 MG PO TABS
2.5000 mg | ORAL_TABLET | Freq: Every day | ORAL | Status: DC
  Administered 2018-01-08 – 2018-01-14 (×7): 2.5 mg via ORAL
  Filled 2018-01-08 (×8): qty 1

## 2018-01-08 MED FILL — Electrolyte-R (PH 7.4) Solution: INTRAVENOUS | Qty: 4000 | Status: AC

## 2018-01-08 MED FILL — Lidocaine HCl Local Soln Prefilled Syringe 100 MG/5ML (2%): INTRAMUSCULAR | Qty: 25 | Status: AC

## 2018-01-08 MED FILL — Heparin Sodium (Porcine) Inj 1000 Unit/ML: INTRAMUSCULAR | Qty: 30 | Status: AC

## 2018-01-08 MED FILL — Mannitol IV Soln 20%: INTRAVENOUS | Qty: 1000 | Status: AC

## 2018-01-08 MED FILL — Sodium Bicarbonate IV Soln 8.4%: INTRAVENOUS | Qty: 50 | Status: AC

## 2018-01-08 MED FILL — Sodium Chloride IV Soln 0.9%: INTRAVENOUS | Qty: 2000 | Status: AC

## 2018-01-08 MED FILL — Heparin Sodium (Porcine) Inj 1000 Unit/ML: INTRAMUSCULAR | Qty: 10 | Status: AC

## 2018-01-08 NOTE — Discharge Instructions (Addendum)
Information on my medicine - Coumadin   (Warfarin)  This medication education was reviewed with me or my healthcare representative as part of my discharge preparation.  The pharmacist that spoke with me during my hospital stay was:  Dareen Piano, [facility]  Why was Coumadin prescribed for you? Coumadin was prescribed for you because you have a blood clot or a medical condition that can cause an increased risk of forming blood clots. Blood clots can cause serious health problems by blocking the flow of blood to the heart, lung, or brain. Coumadin can prevent harmful blood clots from forming. As a reminder your indication for Coumadin is:   Blood Clot Prevention After Heart Valve Surgery  What test will check on my response to Coumadin? While on Coumadin (warfarin) you will need to have an INR test regularly to ensure that your dose is keeping you in the desired range. The INR (international normalized ratio) number is calculated from the result of the laboratory test called prothrombin time (PT).  If an INR APPOINTMENT HAS NOT ALREADY BEEN MADE FOR YOU please schedule an appointment to have this lab work done by your health care provider within 7 days. Your INR goal is usually a number between:  2 to 3 or your provider may give you a more narrow range like 2-2.5.  Ask your health care provider during an office visit what your goal INR is.  What  do you need to  know  About  COUMADIN? Take Coumadin (warfarin) exactly as prescribed by your healthcare provider about the same time each day.  DO NOT stop taking without talking to the doctor who prescribed the medication.  Stopping without other blood clot prevention medication to take the place of Coumadin may increase your risk of developing a new clot or stroke.  Get refills before you run out.  What do you do if you miss a dose? If you miss a dose, take it as soon as you remember on the same day then continue your regularly scheduled regimen the  next day.  Do not take two doses of Coumadin at the same time.  Important Safety Information A possible side effect of Coumadin (Warfarin) is an increased risk of bleeding. You should call your healthcare provider right away if you experience any of the following: ? Bleeding from an injury or your nose that does not stop. ? Unusual colored urine (red or dark brown) or unusual colored stools (red or black). ? Unusual bruising for unknown reasons. ? A serious fall or if you hit your head (even if there is no bleeding).  Some foods or medicines interact with Coumadin (warfarin) and might alter your response to warfarin. To help avoid this: ? Eat a balanced diet, maintaining a consistent amount of Vitamin K. ? Notify your provider about major diet changes you plan to make. ? Avoid alcohol or limit your intake to 1 drink for women and 2 drinks for men per day. (1 drink is 5 oz. wine, 12 oz. beer, or 1.5 oz. liquor.)  Make sure that ANY health care provider who prescribes medication for you knows that you are taking Coumadin (warfarin).  Also make sure the healthcare provider who is monitoring your Coumadin knows when you have started a new medication including herbals and non-prescription products.  Coumadin (Warfarin)  Major Drug Interactions  Increased Warfarin Effect Decreased Warfarin Effect  Alcohol (large quantities) Antibiotics (esp. Septra/Bactrim, Flagyl, Cipro) Amiodarone (Cordarone) Aspirin (ASA) Cimetidine (Tagamet) Megestrol (Megace) NSAIDs (  ibuprofen, naproxen, etc.) Piroxicam (Feldene) Propafenone (Rythmol SR) Propranolol (Inderal) Isoniazid (INH) Posaconazole (Noxafil) Barbiturates (Phenobarbital) Carbamazepine (Tegretol) Chlordiazepoxide (Librium) Cholestyramine (Questran) Griseofulvin Oral Contraceptives Rifampin Sucralfate (Carafate) Vitamin K   Coumadin (Warfarin) Major Herbal Interactions  Increased Warfarin Effect Decreased Warfarin Effect   Garlic Ginseng Ginkgo biloba Coenzyme Q10 Green tea St. Johns wort    Coumadin (Warfarin) FOOD Interactions  Eat a consistent number of servings per week of foods HIGH in Vitamin K (1 serving =  cup)  Collards (cooked, or boiled & drained) Kale (cooked, or boiled & drained) Mustard greens (cooked, or boiled & drained) Parsley *serving size only =  cup Spinach (cooked, or boiled & drained) Swiss chard (cooked, or boiled & drained) Turnip greens (cooked, or boiled & drained)  Eat a consistent number of servings per week of foods MEDIUM-HIGH in Vitamin K (1 serving = 1 cup)  Asparagus (cooked, or boiled & drained) Broccoli (cooked, boiled & drained, or raw & chopped) Brussel sprouts (cooked, or boiled & drained) *serving size only =  cup Lettuce, raw (green leaf, endive, romaine) Spinach, raw Turnip greens, raw & chopped   These websites have more information on Coumadin (warfarin):  FailFactory.se; VeganReport.com.au; Discharge Instructions:  1. You may shower, please wash incisions daily with soap and water and keep dry.  If you wish to cover wounds with dressing you may do so but please keep clean and change daily.  No tub baths or swimming until incisions have completely healed.  If your incisions become red or develop any drainage please call our office at [phone]  2. No driving for at least 2 weeks and you are no longer using narcotic pain medications  3. Monitor your weight daily. Please use the same scale and weigh at same time. If you gain 5-10 lbs in 48 hours with associated lower extremity swelling, please contact our office at [phone]  4. Fever of 101.5 for at least 24 hours with no source, please contact our office at [phone]  5. Activity- up as tolerated, please walk at least 3 times per day.  Avoid strenuous activity, no lifting, pushing, or pulling with your arms over 8-10 lbs for 2 weeks  6. If any questions or concerns  arise, please do not hesitate to contact our office at [phone]

## 2018-01-08 NOTE — Plan of Care (Signed)
Patient alert and oriented, denies shortness of breath, has occasional non productive cough, discussed possible causes relating to intubation and irritation of the back her throat.  Minimal drainage noted from chest tubes.  Will continue to monitor.  Family at the bedside remains supportive.

## 2018-01-08 NOTE — Procedures (Signed)
Extubation Procedure Note  Patient Details:   Name: Brooke Chavez DOB: 10-Mar-1941 MRN: [id number]   Airway Documentation:    Vent end date: 01/08/18 Vent end time: 0614   Evaluation  O2 sats: stable throughout Complications: No apparent complications Patient did tolerate procedure well. Bilateral Breath Sounds: Clear   Yes  Estill Bamberg 01/08/2018, 6:23 AM

## 2018-01-08 NOTE — Progress Notes (Signed)
Amiodarone Drug - Drug Interaction Consult Note  Recommendations: Monitor vital signs. Will monitor INR with warfarin and amiodarone - likely delay impact on INR sensitivity. Pharmacy will follow to monitor future medication interactions.  Amiodarone is metabolized by the cytochrome P450 system and therefore has the potential to cause many drug interactions. Amiodarone has an average plasma half-life of 50 days (range 20 to 100 days).   There is potential for drug interactions to occur several weeks or months after stopping treatment and the onset of drug interactions may be slow after initiating amiodarone.   []  Statins: Increased risk of myopathy. Simvastatin- restrict dose to 20mg  daily. Other statins: counsel patients to report any muscle pain or weakness immediately.  [x]  Anticoagulants: Amiodarone can increase anticoagulant effect. Consider warfarin dose reduction. Patients should be monitored closely and the dose of anticoagulant altered accordingly, remembering that amiodarone levels take several weeks to stabilize.  []  Antiepileptics: Amiodarone can increase plasma concentration of phenytoin, the dose should be reduced. Note that small changes in phenytoin dose can result in large changes in levels. Monitor patient and counsel on signs of toxicity.  []  Beta blockers: increased risk of bradycardia, AV block and myocardial depression. Sotalol - avoid concomitant use.  []   Calcium channel blockers (diltiazem and verapamil): increased risk of bradycardia, AV block and myocardial depression.  []   Cyclosporine: Amiodarone increases levels of cyclosporine. Reduced dose of cyclosporine is recommended.  []  Digoxin dose should be halved when amiodarone is started.  [x]  Diuretics: increased risk of cardiotoxicity if hypokalemia occurs.  []  Oral hypoglycemic agents (glyburide, glipizide, glimepiride): increased risk of hypoglycemia. Patient's glucose levels should be monitored closely when  initiating amiodarone therapy.   []  Drugs that prolong the QT interval:  Torsades de pointes risk may be increased with concurrent use - avoid if possible.  Monitor QTc, also keep magnesium/potassium WNL if concurrent therapy can't be avoided. Marland Kitchen Antibiotics: e.g. fluoroquinolones, erythromycin. . Antiarrhythmics: e.g. quinidine, procainamide, disopyramide, sotalol. . Antipsychotics: e.g. phenothiazines, haloperidol.  . Lithium, tricyclic antidepressants, and methadone.  Thank You,  Betsey Holiday  01/08/2018 5:58 PM

## 2018-01-08 NOTE — Anesthesia Postprocedure Evaluation (Signed)
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 (Right Chest) TRANSESOPHAGEAL ECHOCARDIOGRAM (TEE) (N/A ) PATENT FORAMEN OVALE (PFO) CLOSURE (N/A Chest)     Patient location during evaluation: SICU Anesthesia Type: General Level of consciousness: sedated Pain management: pain level controlled Vital Signs Assessment: post-procedure vital signs reviewed and stable Respiratory status: patient remains intubated per anesthesia plan Cardiovascular status: stable Postop Assessment: no apparent nausea or vomiting Anesthetic complications: no    Last Vitals:  Vitals:   01/08/18 0815 01/08/18 0830  BP:    Pulse: 80 80  Resp: 19 15  Temp:    SpO2: 100% 100%    Last Pain:  Vitals:   01/08/18 0800  TempSrc:   PainSc: 0-No pain                 Helane Briceno S

## 2018-01-08 NOTE — Progress Notes (Signed)
With great effort, pt performed pulmonary extubation mechanics. NIF of -22 and VC of 800 were obtained. Pt is able to follow commands.  ABG was obtained 7.43/30.2/147/20.1.  Pt meets criteria for extubation and was extubated at 0614 without any complications.  Pt placed on 4L [place].

## 2018-01-08 NOTE — Progress Notes (Signed)
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  Will start amiodarone.  Begin diuresis w/ lasix  Rexene Alberts, MD 01/08/2018 5:49 PM

## 2018-01-08 NOTE — Progress Notes (Addendum)
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  Slow to wake up overnight but extubated uneventfully this morning.  Currently awake and alert, denies pain and reports feeling remarkably well  Objective: Vital signs: BP Readings from Last 1 Encounters:  01/08/18 106/60   Pulse Readings from Last 1 Encounters:  01/08/18 80   Resp Readings from Last 1 Encounters:  01/08/18 15   Temp Readings from Last 1 Encounters:  01/08/18 98.8 F (37.1 C)    Hemodynamics: PAP: (34-55)/(16-29) 35/18 CO:  [3.5 L/min-4.9 L/min] 4.7 L/min CI:  [2.3 L/min/m2-2.8 L/min/m2] 2.7 L/min/m2  Physical Exam:  Rhythm:   Slow junctional rhythm vs Afib under pacer - VVI pacing  Breath sounds: clear  Heart sounds:  RRR w/out murmur  Incisions:  Dressings dry, intact  Abdomen:  Soft, non-distended, non-tender  Extremities:  Warm, well-perfused  Chest tubes:  Decreasing but significant volume thin serosanguinous output, no air leak    Intake/Output from previous day: 05/30 0701 - 05/31 0700 In: 7535.7 [I.V.:5160.7; Blood:775; IV [id number]:0160] Out: 1093 [Urine:2415; Blood:850; Chest Tube:830] Intake/Output this shift: Total I/O In: 66.7 [P.O.:60; I.V.:6.7] Out: 220 [Urine:190; Chest Tube:30]  Lab Results:  CBC: Recent Labs    01/07/18 1930 01/08/18 0432  WBC 14.5* 17.9*  HGB 9.8* 10.4*  HCT 30.2* 31.3*  PLT 102* 92*    BMET:  Recent Labs    01/05/18 1242  01/07/18 1853 01/07/18 1930 01/08/18 0432  NA 137   < > 141  --  138  K 4.4   < > 5.3*  --  4.6  CL 108   < > 106  --  113*  CO2 20*  --   --   --  20*  GLUCOSE 94   < > 122*  --  130*  BUN 23*   < > 17  --  14    CREATININE 1.61*   < > 1.20* 1.28* 1.34*  CALCIUM 9.5  --   --   --  7.4*   < > = values in this interval not displayed.     PT/INR:   Recent Labs    01/07/18 1332  LABPROT 17.5*  INR 1.45    CBG (last 3)  Recent Labs    01/07/18 2022 01/07/18 2358 01/08/18 0407  GLUCAP 107* 119* 102*    ABG    Component Value Date/Time   PHART 7.389 01/08/2018 0718   PCO2ART 34.7 01/08/2018 0718   PO2ART 157.0 (H) 01/08/2018 0718   HCO3 21.1 01/08/2018 0718   TCO2 22 01/08/2018 0718   ACIDBASEDEF 4.0 (H) 01/08/2018 0718   O2SAT 99.0 01/08/2018 0718    CXR: PORTABLE CHEST 1 VIEW  COMPARISON:  Yesterday.  FINDINGS: The endotracheal tube has been removed. The left jugular Swan-Ganz catheter tip is in the proximal right main pulmonary artery. Stable enlarged cardiac silhouette and prosthetic heart valve. Bilateral chest tubes remain in place with no pneumothorax seen. Increased left lower lobe airspace opacity. Decreased patchy and linear density in the right lung. Minimal right pleural fluid. Thoracic spine degenerative changes.  IMPRESSION: 1. Increased left lower  lobe atelectasis or pneumonia. 2. Improving right lung atelectasis and possible pneumonia. 3. Minimal right pleural effusion. 4. Stable cardiomegaly.   Electronically Signed   By: Claudie Revering M.D.   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 ring size 28 MM (Right) TRANSESOPHAGEAL ECHOCARDIOGRAM (TEE) (N/A) PATENT FORAMEN OVALE (PFO) CLOSURE (N/A)  Doing well POD1 Maintaining VVI paced rhythm w/ stable hemodynamics on minimal dose Neo for BP support Breathing comfortably w/ O2 sats 100% on 4 L/m via [place] Expected post op acute blood loss anemia, mild Expected post op atelectasis, mild Acute on chronic diastolic CHF with expected post-op volume excess, weight reportedly 16 lbs > preop Post op thrombocytopenia,  mild   Mobilize  Wean Neo off  Hold diuretics until BP stable off Neo  Continue VVI pacing for now  D/C lines  Start Coumadin slowly   Rexene Alberts, MD 01/08/2018 9:01 AM

## 2018-01-08 NOTE — Discharge Summary (Addendum)
Physician Discharge Summary       [address]       [place] [address]             [phone]    Patient ID: Brooke Chavez MRN: [id number] DOB/AGE: 09/14/1940 77 y.o.  Admit date: 01/07/2018 Discharge date: 01/14/2018  Admission Diagnoses: 1.  Severe Mitral Regurgitation 2. Patent Foramen Ovale  Discharge Diagnoses:  1.  S/P minimally invasive mitral valve repair  History of Essential hypertension History of obesity (BMI 30.0-30.9) History of Chronic diastolic congestive heart failure (HCC) History of Atrial flutter (HCC)-s/p CIT ablation 11/11 History of Hyperlipidemia History of Liver masses History of Pancreatic mass History of Femur fracture, left (HCC) History of DJD (degenerative joint disease) History of bradycardia  Procedure (s):   Minimally-Invasive Mitral Valve Repair             Complex valvuloplasty including artificial Gore-tex neochord placement x6             Sorin Memo 4D Ring Annuloplasty (size 9mm, catalog # [id number], serial # [id number])             Closure of patent foramen ovale by Dr. Roxy Manns on 01/07/2018.  History of Presenting Illness: Patient is a 77 year old African-American female with history of mitral regurgitation, atrial flutter status post ablation, hypertension, and degenerative joint disease who has been referred for surgical consultation to discuss treatment option for management of mitral valve prolapse with severe primary mitral regurgitation.  Patient's cardiac history dates back several years ago to 2011 when she was diagnosed with atrial flutter. She underwent cardioversion and later underwent catheter-based ablation in 2012. She has been followed regularly ever since by Dr. Percival Spanish. Previous echocardiograms have demonstrated the presence of normal left ventricular systolic function with mitral valve prolapse and what was felt to be moderate mitral regurgitation. Follow-up transthoracic echocardiogram performed  June 12, 2017 revealed significant change in left ventricular systolic function with ejection fraction estimated at only 45-50%. She subsequently underwent transesophageal echocardiogram on September 18, 2017 which revealed similar findings, with mild left ventricular systolic dysfunction and ejection fraction estimated 45-50%. There was mitral valve prolapse with severe mitral regurgitation confirmed by presence of systolic flow reversal in the pulmonary veins. There was severe left atrial enlargement. There was a patent foramen ovale. Right ventricular size and function was normal. The right atrium was dilated. There was only mild tricuspid regurgitation. Cardiothoracic surgical consultation was requested.  The patient is widowed and lives locally in [place] her daughters and her grandson. She has been retired since 2000, having previously worked in the [employer]. She has remained reasonably active throughout retirement and continues to work 2 or 3 days a week doing light housework. She drives an automobile and remains functionally independent. She describes stable symptoms of exertional shortness of breath. She states that she gets short of breath when she walks up an incline or tries to go up a flight of stairs. She denies shortness of breath with low level activity or at rest. She has not had any PND, ororthopnea. She reports occasional palpitation. She has not had dizzy spells or syncope. She has mild chronic lower extremity edema.   Patient is a 77 year old African-American female who returns the office today for follow-up of severe symptomatic primary mitral regurgitation. 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 The patient subsequently underwentDOTATATE PET imaging and was referred to Dr. [name] for consultation.PET scan revealed intense radiotracer uptake in the tail of the pancreas consistent with primary neuroendocrine neoplasm. There was no significant radiotracer uptake in the liver to suggest the presence of metastatic disease. The patient was seen in follow-up by Dr. 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 The patient states that she remains clinically stable. She describes stable symptoms of exertional shortness of breath that occur only with more strenuous physical exertion. She denies any resting shortness of breath, PND, orthopnea, or lower extremity edema. She has had occasional brief palpitations and occasional slight dizzy spells. She does not experience any chest pain or abdominal pain. She reports occasional diarrhea. The patient wasagaincounseled at length regarding the indications, risks and potential benefits of mitral valve repair. The rationale for elective surgery has been explained, including a comparison between surgery and continued medical therapy with close follow-up. The likelihood of successful and durable valve repair has been discussed with particular reference to the findings of their recent echocardiogram. Based upon these findings and previous experience, Dr.Owen quoted them a greater than 90percent likelihood of successful valve repair. In the unlikely event that their valve cannot be successfully repaired, we discussed the possibility of replacing the mitral valve using a mechanical prosthesis with the attendant need for  long-term anticoagulation versus the alternative of replacing it using a bioprosthetic tissue valve with its potential for late structural valve deterioration and failure, depending upon the patient's longevity. The patient specifically requests that if the mitral valve must be replaced that it be done using a bioprosthetic tissuevalve. The patient understands and accepts all potential risks of surgery. Pre operative carotid duplex showed no significant internal carotid artery stenosis bilaterally. Patient underwent a minimally invasive  mitral valve repair and closure of PFO on 01/01/2018.   Brief Hospital Course:  The patient was extubated early the morning of post operative day one without difficulty. She remained afebrile and hemodynamically stable. She was initially VVI paced. She then was mostly bradycardic (she has a history, was NOT on a beta blocker). She was weaned off of Neo Synephrine drip. Gordy Councilman, a line,  and foley were removed early in the post operative course. Chest tubes remained for a few days and then when the output decreased, were removed 06/03. She was started on Coumadin. PT and INR were monitored daily. As of 01/14/2018, she is on 2.5 mg of Coumadin. Her latest PT and INR are 18.3 and 1.54. She was volume over loaded and diuresed. She had ABL anemia. She did not require a post op transfusion. Last H and H was 9.6 and 29.1. She was weaned off the insulin drip.  The patient's HGA1C pre op was 5.3. The patient was felt surgically stable for transfer from the ICU to [facility] for further convalescence on 01/11/2018. She continues to progress with cardiac rehab. She was ambulating on room air. She has been tolerating a diet and has had a bowel movement. Epicardial pacing wires were attempted to be removed on 06/03 but the nurse felt  resistance. I then removed epicardial pacing wires on  06/04. Her creatinine increased to 1.79. Lasix was held and Losartan was stopped. Her last creatinine was  down to 1.62. As per discussion with Dr. Roxy Manns, we will stop Spironolactone and restart HCTZ 25 mg daily.The patient is felt surgically stable for discharge today.   Latest Vital Signs: Blood pressure 124/62, pulse 62, temperature 98.3 F (36.8 C), temperature source Oral, resp. rate 20, height 5\' 3"  (1.6 m), weight 156 lb 4.8 oz (70.9 kg), last menstrual period 11/05/1992, SpO2 98 %.  Physical Exam: Cardiovascular: RRR Pulmonary: Mostly clear bilaterally Abdomen: Soft, non tender, bowel sounds present. Extremities: Trace bilateral lower extremity edema. Wounds:  Clean and dry.     Discharge Condition: Stable and discharged to home.  Recent laboratory studies:  Lab Results  Component Value Date   WBC 9.1 01/12/2018   HGB 9.6 (L) 01/12/2018   HCT 29.1 (L) 01/12/2018   MCV 95.7 01/12/2018   PLT 167 01/12/2018   Lab Results  Component Value Date   NA 136 01/14/2018   K 4.4 01/14/2018   CL 100 (L) 01/14/2018   CO2 26 01/14/2018   CREATININE 1.62 (H) 01/14/2018   GLUCOSE 95 01/14/2018    Diagnostic Studies: Dg Chest 2 View  Result Date: 01/13/2018 CLINICAL DATA:  Pneumothorax. EXAM: CHEST - 2 VIEW COMPARISON:  Yesterday FINDINGS: Stable cardiomegaly. Status post mitral valve repair. Trace right pneumothorax is unchanged. Streaky opacities in the low volume lungs, but improved from prior. Stable chest wall gas on the right. IMPRESSION: 1. Stable trace right pneumothorax. 2. Improved lung volumes. Electronically Signed   By: Monte Fantasia M.D.   On: 01/13/2018 09:44     Discharge Medications: Allergies as of 01/14/2018      Reactions   Oxycodone-acetaminophen Anxiety, Other (See Comments)   Hallucinations   Aspirin Nausea And Vomiting      Medication List    STOP taking these medications   amLODipine 10 MG tablet Commonly known as:  NORVASC   apixaban 5 MG Tabs tablet Commonly known as:  ELIQUIS   losartan-hydrochlorothiazide 50-12.5 MG tablet Commonly known as:   HYZAAR   spironolactone 25 MG tablet Commonly known as:  ALDACTONE     TAKE these medications   acetaminophen 500 MG tablet Commonly known as:  TYLENOL Take 1,000 mg by mouth every 6 (six) hours as needed for moderate pain or headache.   amiodarone 200 MG tablet Commonly known as:  PACERONE Take 1 tablet (200 mg total) by mouth daily.   atorvastatin 40 MG tablet Commonly known as:  LIPITOR Take 1 tablet (40 mg total) by mouth at bedtime.   ferrous sulfate 325 (65 FE) MG tablet Take 1 tablet (325 mg total) by mouth daily with breakfast. For one month then stop.   hydrochlorothiazide 25 MG tablet Commonly known as:  HYDRODIURIL Take 1 tablet (25 mg total) by mouth daily.   traMADol 50 MG tablet Commonly known as:  ULTRAM Take 50 mg by mouth every 4-6 hours PRN severe pain   warfarin 2.5 MG tablet Commonly known as:  COUMADIN Take 1 tablet (2.5 mg total) by mouth daily at 6 PM. Or as directed      The patient has been discharged on:   1.Beta Blocker:  Yes [   ]                              No   [  x ]  If No, reason:Bradycardia  2.Ace Inhibitor/ARB: Yes [   ]                                     No  [  x  ]                                     If No, reason:Elevated creatinine, low BP  3.Statin:   Yes [ x  ]                  No  [   ]                  If No, reason:  4.Ecasa:  Yes  [ x  ]                  No   [   ]                  If No, reason: Follow Up Appointments: Follow-up Information    Rexene Alberts, MD Follow up on 01/25/2018.   Specialty:  Cardiothoracic Surgery Why:  PA/LAT CXR to be taken at 10:00 (at [facility] which is in the same building as Dr. Guy Sandifer office);Appointment time is at 10:30 Contact information: [place] [address] [phone]        [facility] [employer]. Go on 01/18/2018.   Specialty:  Cardiology Why:  for  PT and INR (as is on Coumadin for  mitral valve repair, INR goal 2-2.5) to be drawn . Appointment time is at 10:30 am Contact information: [address] [place], [place], [place]. Go on 01/27/2018.   Specialties:  Cardiology, Radiology Why:  Appointment time is at 11:00 am Contact information: [place] [address] [phone]           Signed: Sharalyn Ink [facility] 01/14/2018, 9:22 AM

## 2018-01-09 ENCOUNTER — Inpatient Hospital Stay (HOSPITAL_COMMUNITY): Payer: Medicare Other

## 2018-01-09 LAB — PROTIME-INR
INR: 1.14
Prothrombin Time: 14.5 seconds (ref 11.4–15.2)

## 2018-01-09 LAB — CBC
HCT: 31.7 % — ABNORMAL LOW (ref 36.0–46.0)
Hemoglobin: 10.6 g/dL — ABNORMAL LOW (ref 12.0–15.0)
MCH: 32 pg (ref 26.0–34.0)
MCHC: 33.4 g/dL (ref 30.0–36.0)
MCV: 95.8 fL (ref 78.0–100.0)
Platelets: 93 10*3/uL — ABNORMAL LOW (ref 150–400)
RBC: 3.31 MIL/uL — ABNORMAL LOW (ref 3.87–5.11)
RDW: 13.3 % (ref 11.5–15.5)
WBC: 17.7 10*3/uL — ABNORMAL HIGH (ref 4.0–10.5)

## 2018-01-09 LAB — BASIC METABOLIC PANEL
Anion gap: 7 (ref 5–15)
BUN: 12 mg/dL (ref 6–20)
CO2: 24 mmol/L (ref 22–32)
Calcium: 7.8 mg/dL — ABNORMAL LOW (ref 8.9–10.3)
Chloride: 105 mmol/L (ref 101–111)
Creatinine, Ser: 1.36 mg/dL — ABNORMAL HIGH (ref 0.44–1.00)
GFR calc Af Amer: 42 mL/min — ABNORMAL LOW (ref >60)
GFR calc non Af Amer: 36 mL/min — ABNORMAL LOW (ref >60)
Glucose, Bld: 111 mg/dL — ABNORMAL HIGH (ref 65–99)
Potassium: 4.1 mmol/L (ref 3.5–5.1)
Sodium: 136 mmol/L (ref 135–145)

## 2018-01-09 LAB — GLUCOSE, CAPILLARY
Glucose-Capillary: 107 mg/dL — ABNORMAL HIGH (ref 65–99)
Glucose-Capillary: 112 mg/dL — ABNORMAL HIGH (ref 65–99)
Glucose-Capillary: 164 mg/dL — ABNORMAL HIGH (ref 65–99)
Glucose-Capillary: 75 mg/dL (ref 65–99)
Glucose-Capillary: 82 mg/dL (ref 65–99)
Glucose-Capillary: 94 mg/dL (ref 65–99)

## 2018-01-09 MED ORDER — ATORVASTATIN CALCIUM 40 MG PO TABS
40.0000 mg | ORAL_TABLET | Freq: Every day | ORAL | Status: DC
  Administered 2018-01-10 – 2018-01-13 (×4): 40 mg via ORAL
  Filled 2018-01-09 (×4): qty 1

## 2018-01-09 MED ORDER — FUROSEMIDE 10 MG/ML IJ SOLN
20.0000 mg | Freq: Four times a day (QID) | INTRAMUSCULAR | Status: AC
  Administered 2018-01-09 (×3): 20 mg via INTRAVENOUS
  Filled 2018-01-09 (×3): qty 2

## 2018-01-09 MED ORDER — SPIRONOLACTONE 25 MG PO TABS
25.0000 mg | ORAL_TABLET | Freq: Every day | ORAL | Status: DC
  Administered 2018-01-09 – 2018-01-14 (×6): 25 mg via ORAL
  Filled 2018-01-09 (×6): qty 1

## 2018-01-09 MED ORDER — AMIODARONE HCL 200 MG PO TABS
200.0000 mg | ORAL_TABLET | Freq: Two times a day (BID) | ORAL | Status: DC
  Administered 2018-01-09 – 2018-01-11 (×6): 200 mg via ORAL
  Filled 2018-01-09 (×6): qty 1

## 2018-01-09 MED ORDER — MOVING RIGHT ALONG BOOK
Freq: Once | Status: AC
  Administered 2018-01-09: 11:00:00
  Filled 2018-01-09: qty 1

## 2018-01-09 MED ORDER — FUROSEMIDE 40 MG PO TABS
40.0000 mg | ORAL_TABLET | Freq: Two times a day (BID) | ORAL | Status: DC
  Administered 2018-01-10 – 2018-01-11 (×4): 40 mg via ORAL
  Filled 2018-01-09 (×4): qty 1

## 2018-01-09 MED ORDER — LOSARTAN POTASSIUM 25 MG PO TABS
25.0000 mg | ORAL_TABLET | Freq: Every day | ORAL | Status: DC
  Administered 2018-01-09 – 2018-01-11 (×3): 25 mg via ORAL
  Filled 2018-01-09 (×3): qty 1

## 2018-01-09 MED ORDER — POTASSIUM CHLORIDE CRYS ER 20 MEQ PO TBCR
20.0000 meq | EXTENDED_RELEASE_TABLET | Freq: Two times a day (BID) | ORAL | Status: DC
Start: 2018-01-10 — End: 2018-01-12
  Administered 2018-01-10 – 2018-01-11 (×4): 20 meq via ORAL
  Filled 2018-01-09 (×4): qty 1

## 2018-01-09 NOTE — Plan of Care (Signed)
No acute events during the day. Patient has been de-lined and is awaiting floor room for transfer. Started on Coumadin and given education regarding medication. Ambulating/voiding on own. Oxygen weaned off. Will continue to monitor.

## 2018-01-09 NOTE — Progress Notes (Addendum)
[address]       [place] [address]             [phone]        CARDIOTHORACIC SURGERY PROGRESS NOTE   R2 Days Post-Op Procedure(s) (LRB): MINIMALLY INVASIVE MITRAL VALVE REPAIR using LivaNova ring size 28 MM (Right) TRANSESOPHAGEAL ECHOCARDIOGRAM (TEE) (N/A) PATENT FORAMEN OVALE (PFO) CLOSURE (N/A)  Subjective: Looks good.  Minimal soreness in chest.  Ambulated well around entire ICU.  No SOB.  Appetite marginal but no nausea.  Objective: Vital signs: BP Readings from Last 1 Encounters:  01/09/18 (!) 118/57   Pulse Readings from Last 1 Encounters:  01/09/18 63   Resp Readings from Last 1 Encounters:  01/09/18 (!) 24   Temp Readings from Last 1 Encounters:  01/09/18 98.5 F (36.9 C) (Oral)    Hemodynamics:    Physical Exam:  Rhythm:   Sinus - junctional - HR 50-60  Breath sounds: clear  Heart sounds:  RRR w/out murmur  Incisions:  Dressings dry, intact  Abdomen:  Soft, non-distended, non-tender  Extremities:  Warm, well-perfused  Chest tubes:  decreasing volume thin serosanguinous output, no air leak    Intake/Output from previous day: 05/31 0701 - 06/01 0700 In: 863.5 [P.O.:180; I.V.:583.5; IV Piggyback:100] Out: 4485 [Urine:4075; Chest Tube:410] Intake/Output this shift: No intake/output data recorded.  Lab Results:  CBC: Recent Labs    01/08/18 1601 01/08/18 1610 01/09/18 0653  WBC 16.6*  --  17.7*  HGB 10.1* 10.5* 10.6*  HCT 30.8* 31.0* 31.7*  PLT 91*  --  93*    BMET:  Recent Labs    01/08/18 0432  01/08/18 1610 01/09/18 0259  NA 138  --  138 136  K 4.6  --  4.6 4.1  CL 113*  --  104 105  CO2 20*  --   --  24  GLUCOSE 130*  --  122* 111*  BUN 14  --  13 12  CREATININE 1.34*   < > 1.10* 1.36*  CALCIUM 7.4*  --   --  7.8*   < > = values in this interval not displayed.     PT/INR:   Recent Labs    01/09/18 0653  LABPROT 14.5  INR 1.14    CBG (last 3)  Recent Labs    01/09/18 0016  01/09/18 0438 01/09/18 0823  GLUCAP 107* 112* 164*    ABG    Component Value Date/Time   PHART 7.389 01/08/2018 0718   PCO2ART 34.7 01/08/2018 0718   PO2ART 157.0 (H) 01/08/2018 0718   HCO3 21.1 01/08/2018 0718   TCO2 21 (L) 01/08/2018 1610   ACIDBASEDEF 4.0 (H) 01/08/2018 0718   O2SAT 99.0 01/08/2018 0718    CXR: PORTABLE CHEST 1 VIEW  COMPARISON:  01/08/2018 and older exams.  FINDINGS: Cardiac silhouette remains enlarged.  No mediastinal widening.  Swan-Ganz catheter has been removed. Left introducer sheath is stable and well positioned.  Right chest tube is stable.  No pneumothorax.  Streaky opacities noted in the right mid lung. There is more confluent opacity at the lung bases, left greater than right. This is likely atelectasis, and is stable from the prior exam. No pulmonary edema.  IMPRESSION: 1. No evidence of an operative complication. No pneumothorax or pulmonary edema. No mediastinal widening. 2. Remaining support apparatus is well positioned. 3. Right mid lung and bilateral lung base opacities most likely atelectasis.   Electronically Signed   By: [name]  Ormond M.D.   On: 01/09/2018 08:14  Assessment/Plan: S/P Procedure(s) (LRB): MINIMALLY INVASIVE MITRAL VALVE REPAIR using LivaNova ring size 28 MM (Right) TRANSESOPHAGEAL ECHOCARDIOGRAM (TEE) (N/A) PATENT FORAMEN OVALE (PFO) CLOSURE (N/A)  Doing well Post-op atrial fibrillation, currently maintaining NSR/junctional rhythm on IV amiodarone with stable BP Breathing comfortably w/ O2 sats 95-100% on 1 L/min Acute on chronic diastolic CHF with expected post-op volume excess, diuresing very well and weight down 7 lbs but still reportedly 9 lbs > preop Expected post op acute blood loss anemia, very mild, stable Expected post op atelectasis, mild Post op thrombocytopenia, platelet count 93k stable   Mobilize  Diuresis  Leave chest tubes in place until output decreases further  Pacer at  VVI backup for now  Convert amiodarone to oral  Coumadin  Restart losartan at reduced dose  Restart spironolactone  Transfer step down    Rexene Alberts, MD 01/09/2018 9:22 AM

## 2018-01-10 LAB — BASIC METABOLIC PANEL
Anion gap: 7 (ref 5–15)
BUN: 14 mg/dL (ref 6–20)
CO2: 29 mmol/L (ref 22–32)
Calcium: 8.7 mg/dL — ABNORMAL LOW (ref 8.9–10.3)
Chloride: 101 mmol/L (ref 101–111)
Creatinine, Ser: 1.47 mg/dL — ABNORMAL HIGH (ref 0.44–1.00)
GFR calc Af Amer: 39 mL/min — ABNORMAL LOW (ref >60)
GFR calc non Af Amer: 33 mL/min — ABNORMAL LOW (ref >60)
Glucose, Bld: 96 mg/dL (ref 65–99)
Potassium: 3.8 mmol/L (ref 3.5–5.1)
Sodium: 137 mmol/L (ref 135–145)

## 2018-01-10 LAB — PROTIME-INR
INR: 1.14
Prothrombin Time: 14.6 seconds (ref 11.4–15.2)

## 2018-01-10 LAB — CBC
HCT: 31 % — ABNORMAL LOW (ref 36.0–46.0)
Hemoglobin: 10.3 g/dL — ABNORMAL LOW (ref 12.0–15.0)
MCH: 31.5 pg (ref 26.0–34.0)
MCHC: 33.2 g/dL (ref 30.0–36.0)
MCV: 94.8 fL (ref 78.0–100.0)
Platelets: 102 10*3/uL — ABNORMAL LOW (ref 150–400)
RBC: 3.27 MIL/uL — ABNORMAL LOW (ref 3.87–5.11)
RDW: 13.2 % (ref 11.5–15.5)
WBC: 13.4 10*3/uL — ABNORMAL HIGH (ref 4.0–10.5)

## 2018-01-10 MED ORDER — POTASSIUM CHLORIDE CRYS ER 20 MEQ PO TBCR
40.0000 meq | EXTENDED_RELEASE_TABLET | Freq: Once | ORAL | Status: AC
  Administered 2018-01-10: 40 meq via ORAL
  Filled 2018-01-10: qty 2

## 2018-01-10 NOTE — Progress Notes (Signed)
      [address]       [place] [address]             [phone]        CARDIOTHORACIC SURGERY PROGRESS NOTE   R3 Days Post-Op Procedure(s) (LRB): MINIMALLY INVASIVE MITRAL VALVE REPAIR using LivaNova ring size 28 MM (Right) TRANSESOPHAGEAL ECHOCARDIOGRAM (TEE) (N/A) PATENT FORAMEN OVALE (PFO) CLOSURE (N/A)  Subjective: Looks good and feels well.  No pain.  No SOB.  Good appetite.  Ambulated already once this morning.  No BM yet  Objective: Vital signs: BP Readings from Last 1 Encounters:  01/10/18 (!) 124/46   Pulse Readings from Last 1 Encounters:  01/10/18 (!) 58   Resp Readings from Last 1 Encounters:  01/10/18 (!) 24   Temp Readings from Last 1 Encounters:  01/10/18 98.6 F (37 C) (Oral)    Hemodynamics:    Physical Exam:  Rhythm:   Sinus/junctional w/ stable HR 55-60  Breath sounds: clear  Heart sounds:  RRR w/out murmur  Incisions:  Clean and dry  Abdomen:  Soft, non-distended, non-tender  Extremities:  Warm, well-perfused  Chest tubes:  decreasing volume thin serosanguinous output, no air leak    Intake/Output from previous day: 06/01 0701 - 06/02 0700 In: 523.4 [P.O.:490; I.V.:33.4] Out: 2435 [Urine:2225; Chest Tube:210] Intake/Output this shift: Total I/O In: -  Out: 380 [Urine:350; Chest Tube:30]  Lab Results:  CBC: Recent Labs    01/09/18 0653 01/10/18 0312  WBC 17.7* 13.4*  HGB 10.6* 10.3*  HCT 31.7* 31.0*  PLT 93* 102*    BMET:  Recent Labs    01/09/18 0259 01/10/18 0312  NA 136 137  K 4.1 3.8  CL 105 101  CO2 24 29  GLUCOSE 111* 96  BUN 12 14  CREATININE 1.36* 1.47*  CALCIUM 7.8* 8.7*     PT/INR:   Recent Labs    01/10/18 0312  LABPROT 14.6  INR 1.14    CBG (last 3)  Recent Labs    01/09/18 1210 01/09/18 1616 01/09/18 2027  GLUCAP 82 94 75    ABG    Component Value Date/Time   PHART 7.389 01/08/2018 0718   PCO2ART 34.7 01/08/2018 0718   PO2ART 157.0 (H) 01/08/2018 0718   HCO3  21.1 01/08/2018 0718   TCO2 21 (L) 01/08/2018 1610   ACIDBASEDEF 4.0 (H) 01/08/2018 0718   O2SAT 99.0 01/08/2018 0718    CXR: n/a  Assessment/Plan: S/P Procedure(s) (LRB): MINIMALLY INVASIVE MITRAL VALVE REPAIR using LivaNova ring size 28 MM (Right) TRANSESOPHAGEAL ECHOCARDIOGRAM (TEE) (N/A) PATENT FORAMEN OVALE (PFO) CLOSURE (N/A)  Doing well Maintaining NSR/junctional rhythm on oral amiodarone with stable BP Breathing comfortably w/ O2 sats 95-100% on 1 L/min Acute on chronic diastolic CHF with expected post-op volume excess,diuresing very well and weight down another 5 lbs but still reportedly 4 lbs > preop Expected post op acute blood loss anemia, verymild, stable Expected post op atelectasis,mild Post op thrombocytopenia,platelet count 102k improved   Mobilize  Diuresis  Leave chest tubes in place until output decreases further  Continue amiodarone  Coumadin  Continue losartan  Continue spironolactone  Awaiting bed on 4E for transfer    Rexene Alberts, MD 01/10/2018 9:03 AM

## 2018-01-10 NOTE — Plan of Care (Signed)
  Problem: Education: Goal: Knowledge of General Education information will improve Outcome: Progressing   Problem: Health Behavior/Discharge Planning: Goal: Ability to manage health-related needs will improve Outcome: Progressing   Problem: Clinical Measurements: Goal: Ability to maintain clinical measurements within normal limits will improve Outcome: Progressing Goal: Will remain free from infection Outcome: Progressing Goal: Respiratory complications will improve Outcome: Progressing Goal: Cardiovascular complication will be avoided Outcome: Progressing   Problem: Activity: Goal: Risk for activity intolerance will decrease Outcome: Progressing   Problem: Nutrition: Goal: Adequate nutrition will be maintained Outcome: Progressing   Problem: Elimination: Goal: Will not experience complications related to bowel motility Outcome: Progressing Goal: Will not experience complications related to urinary retention Outcome: Progressing   Problem: Pain Managment: Goal: General experience of comfort will improve Outcome: Progressing   Problem: Skin Integrity: Goal: Risk for impaired skin integrity will decrease Outcome: Progressing   Problem: Activity: Goal: Risk for activity intolerance will decrease Outcome: Progressing   Problem: Cardiac: Goal: Hemodynamic stability will improve Outcome: Progressing   Problem: Clinical Measurements: Goal: Postoperative complications will be avoided or minimized Outcome: Progressing   Problem: Respiratory: Goal: Respiratory status will improve Outcome: Progressing   Problem: Urinary Elimination: Goal: Ability to achieve and maintain adequate renal perfusion and functioning will improve Outcome: Progressing

## 2018-01-11 LAB — BASIC METABOLIC PANEL
Anion gap: 8 (ref 5–15)
BUN: 19 mg/dL (ref 6–20)
CO2: 27 mmol/L (ref 22–32)
Calcium: 8.4 mg/dL — ABNORMAL LOW (ref 8.9–10.3)
Chloride: 102 mmol/L (ref 101–111)
Creatinine, Ser: 1.39 mg/dL — ABNORMAL HIGH (ref 0.44–1.00)
GFR calc Af Amer: 41 mL/min — ABNORMAL LOW (ref >60)
GFR calc non Af Amer: 36 mL/min — ABNORMAL LOW (ref >60)
Glucose, Bld: 93 mg/dL (ref 65–99)
Potassium: 4.3 mmol/L (ref 3.5–5.1)
Sodium: 137 mmol/L (ref 135–145)

## 2018-01-11 LAB — PROTIME-INR
INR: 1.13
Prothrombin Time: 14.4 seconds (ref 11.4–15.2)

## 2018-01-11 MED FILL — Heparin Sodium (Porcine) Inj 1000 Unit/ML: INTRAMUSCULAR | Qty: 30 | Status: AC

## 2018-01-11 MED FILL — Potassium Chloride Inj 2 mEq/ML: INTRAVENOUS | Qty: 40 | Status: AC

## 2018-01-11 MED FILL — Magnesium Sulfate Inj 50%: INTRAMUSCULAR | Qty: 10 | Status: AC

## 2018-01-11 MED FILL — Heparin Sodium (Porcine) Inj 1000 Unit/ML: INTRAMUSCULAR | Qty: 2500 | Status: AC

## 2018-01-11 NOTE — Progress Notes (Signed)
Attempted to pull pacer wires per order with 2 different nurse attempts (myself and asst dir of unit, Lydia) and wires were very resistant to removal past approx 3 and 4 cm per wire. Notified Lars Pinks who instructed to leave wires in place but do proceed with removal of chest tubes. Pacer wires coiled and taped and patient made aware they would be addressed in the morning during rounds. She verbalized understanding and agrees to above. She denies pain or other symptoms to area.

## 2018-01-11 NOTE — Progress Notes (Signed)
Patient received from 2 Heart in stable condition, oriented to room and unit and set up on telemetry. Chest tubes x 2 and pacer wires intact.

## 2018-01-11 NOTE — Progress Notes (Signed)
      [address]       [place] [address]             [phone]        CARDIOTHORACIC SURGERY PROGRESS NOTE   R4 Days Post-Op Procedure(s) (LRB): MINIMALLY INVASIVE MITRAL VALVE REPAIR using LivaNova ring size 28 MM (Right) TRANSESOPHAGEAL ECHOCARDIOGRAM (TEE) (N/A) PATENT FORAMEN OVALE (PFO) CLOSURE (N/A)  Subjective: Looks good and feels well.  No pain, SOB.  Appetite improving.  No BM since surgery  Objective: Vital signs: BP Readings from Last 1 Encounters:  01/11/18 (!) 135/50   Pulse Readings from Last 1 Encounters:  01/10/18 (!) 53   Resp Readings from Last 1 Encounters:  01/11/18 20   Temp Readings from Last 1 Encounters:  01/11/18 98.2 F (36.8 C) (Oral)    Hemodynamics:    Physical Exam:  Rhythm:   Sinus/junctional w/ stable HR  Breath sounds: clear  Heart sounds:  RRR w/out murmur  Incisions:  Clean and dry  Abdomen:  Soft, non-distended, non-tender  Extremities:  Warm, well-perfused  Chest tubes:  decreasing volume thin serosanguinous output, no air leak    Intake/Output from previous day: 06/02 0701 - 06/03 0700 In: 810 [P.O.:810] Out: 4130 [Urine:3850; Chest Tube:280] Intake/Output this shift: Total I/O In: 250 [P.O.:250] Out: -   Lab Results:  CBC: Recent Labs    01/09/18 0653 01/10/18 0312  WBC 17.7* 13.4*  HGB 10.6* 10.3*  HCT 31.7* 31.0*  PLT 93* 102*    BMET:  Recent Labs    01/10/18 0312 01/11/18 0344  NA 137 137  K 3.8 4.3  CL 101 102  CO2 29 27  GLUCOSE 96 93  BUN 14 19  CREATININE 1.47* 1.39*  CALCIUM 8.7* 8.4*     PT/INR:   Recent Labs    01/11/18 0344  LABPROT 14.4  INR 1.13    CBG (last 3)  Recent Labs    01/09/18 1210 01/09/18 1616 01/09/18 2027  GLUCAP 82 94 75    ABG    Component Value Date/Time   PHART 7.389 01/08/2018 0718   PCO2ART 34.7 01/08/2018 0718   PO2ART 157.0 (H) 01/08/2018 0718   HCO3 21.1 01/08/2018 0718   TCO2 21 (L) 01/08/2018 1610   ACIDBASEDEF  4.0 (H) 01/08/2018 0718   O2SAT 99.0 01/08/2018 0718    CXR: n/a  Assessment/Plan: S/P Procedure(s) (LRB): MINIMALLY INVASIVE MITRAL VALVE REPAIR using LivaNova ring size 28 MM (Right) TRANSESOPHAGEAL ECHOCARDIOGRAM (TEE) (N/A) PATENT FORAMEN OVALE (PFO) CLOSURE (N/A)  Doing well POD4 Maintaining NSR/junctional rhythm on oral amiodarone with stable BP Breathing comfortably w/ O2 sats 95-100% on room air Acute on chronic diastolic CHF with expected post-op volume excess,diuresing very well andweightdown another 4 lbs and essentially at preop baseline Expected post op acute blood loss anemia,verymild, stable Expected post op atelectasis,mild Post op thrombocytopenia,mild   Mobilize  D/C pacing wires and chest tubes  Continue amiodarone  Coumadin  Continue losartan  Continue spironolactone  Awaiting bed on 4E for transfer     Brooke Alberts, MD 01/11/2018 9:05 AM

## 2018-01-11 NOTE — Progress Notes (Signed)
1130 Offered to walk with pt. She just got to bed. Will follow up after lunch. Graylon Good RN BSN 01/11/2018 11:32 AM

## 2018-01-11 NOTE — Progress Notes (Signed)
Right chest tubes pulled per protocol, sutures tightened and tied and dressing placed over area. Patient on bedrest for one hour and denies pain or other symptoms. She tolerated procedure well.

## 2018-01-11 NOTE — Progress Notes (Signed)
CARDIAC REHAB PHASE I   PRE:  Rate/Rhythm: 60 SR 1HB ?  BP:  Supine: 130/49  Sitting:   Standing:    SaO2: 98%RA  MODE:  Ambulation: 470 ft   POST:  Rate/Rhythm: 77 SR 1HB?  BP:  Supine: 148/61  Sitting:   Standing:    SaO2: 100%RA 1337-1405 Pt walked 470 ft on RA with rolling walker and minimal asst with fairly steady gait. Needs to stay closer to walker and within walker. No complaints. To bed after walk. Chest tube and pacing wires to be pulled. Tolerated well.   Graylon Good, RN BSN  01/11/2018 1:58 PM

## 2018-01-12 ENCOUNTER — Inpatient Hospital Stay (HOSPITAL_COMMUNITY): Payer: Medicare Other

## 2018-01-12 LAB — BASIC METABOLIC PANEL
Anion gap: 9 (ref 5–15)
BUN: 21 mg/dL — ABNORMAL HIGH (ref 6–20)
CO2: 29 mmol/L (ref 22–32)
Calcium: 8.6 mg/dL — ABNORMAL LOW (ref 8.9–10.3)
Chloride: 98 mmol/L — ABNORMAL LOW (ref 101–111)
Creatinine, Ser: 1.68 mg/dL — ABNORMAL HIGH (ref 0.44–1.00)
GFR calc Af Amer: 33 mL/min — ABNORMAL LOW (ref >60)
GFR calc non Af Amer: 28 mL/min — ABNORMAL LOW (ref >60)
Glucose, Bld: 94 mg/dL (ref 65–99)
Potassium: 4.4 mmol/L (ref 3.5–5.1)
Sodium: 136 mmol/L (ref 135–145)

## 2018-01-12 LAB — PROTIME-INR
INR: 1.24
Prothrombin Time: 15.5 seconds — ABNORMAL HIGH (ref 11.4–15.2)

## 2018-01-12 LAB — CBC
HCT: 29.1 % — ABNORMAL LOW (ref 36.0–46.0)
Hemoglobin: 9.6 g/dL — ABNORMAL LOW (ref 12.0–15.0)
MCH: 31.6 pg (ref 26.0–34.0)
MCHC: 33 g/dL (ref 30.0–36.0)
MCV: 95.7 fL (ref 78.0–100.0)
Platelets: 167 10*3/uL (ref 150–400)
RBC: 3.04 MIL/uL — ABNORMAL LOW (ref 3.87–5.11)
RDW: 13.5 % (ref 11.5–15.5)
WBC: 9.1 10*3/uL (ref 4.0–10.5)

## 2018-01-12 MED ORDER — FA-PYRIDOXINE-CYANOCOBALAMIN 2.5-25-2 MG PO TABS
1.0000 | ORAL_TABLET | Freq: Every day | ORAL | Status: DC
  Administered 2018-01-12 – 2018-01-14 (×3): 1 via ORAL
  Filled 2018-01-12 (×3): qty 1

## 2018-01-12 MED ORDER — FUROSEMIDE 40 MG PO TABS: 40.0000 mg | ORAL_TABLET | Freq: Every day | ORAL | Status: DC

## 2018-01-12 MED ORDER — AMIODARONE HCL 200 MG PO TABS
200.0000 mg | ORAL_TABLET | Freq: Every day | ORAL | Status: DC
  Administered 2018-01-12 – 2018-01-14 (×3): 200 mg via ORAL
  Filled 2018-01-12 (×3): qty 1

## 2018-01-12 MED ORDER — LACTULOSE 10 GM/15ML PO SOLN
20.0000 g | Freq: Once | ORAL | Status: AC
  Administered 2018-01-12: 20 g via ORAL
  Filled 2018-01-12: qty 30

## 2018-01-12 MED ORDER — POTASSIUM CHLORIDE CRYS ER 20 MEQ PO TBCR: 20.0000 meq | EXTENDED_RELEASE_TABLET | Freq: Every day | ORAL | Status: DC

## 2018-01-12 NOTE — Care Management Important Message (Signed)
Important Message  Patient Details  Name: TARRAH FURUTA MRN: [id number] Date of Birth: [birth year]   Medicare Important Message Given:  Yes    Manas Hickling P [place] 01/12/2018, 3:11 PM

## 2018-01-12 NOTE — Progress Notes (Addendum)
[address]       ,[place] [address]             [phone]        5 Days Post-Op Procedure(s) (LRB): MINIMALLY INVASIVE MITRAL VALVE REPAIR using LivaNova ring size 28 MM (Right) TRANSESOPHAGEAL ECHOCARDIOGRAM (TEE) (N/A) PATENT FORAMEN OVALE (PFO) CLOSURE (N/A)  Subjective: Patient passing flatus but no bowel movement yet.   Objective: Vital signs in last 24 hours: Temp:  [98.2 F (36.8 C)-99.2 F (37.3 C)] 98.7 F (37.1 C) (06/04 0447) Pulse Rate:  [46-86] 46 (06/04 0447) Cardiac Rhythm: Heart block;Bundle branch block (06/03 1948) Resp:  [15-23] 20 (06/04 0447) BP: (118-143)/(50-107) 120/56 (06/04 0447) SpO2:  [91 %-98 %] 98 % (06/04 0447) Weight:  [157 lb 4.8 oz (71.4 kg)] 157 lb 4.8 oz (71.4 kg) (06/04 0447)   Pre op weight 73 kg Current Weight  01/12/18 157 lb 4.8 oz (71.4 kg)       Intake/Output from previous day: 06/03 0701 - 06/04 0700 In: 755 [P.O.:730] Out: 3800 [Urine:3800]   Physical Exam:  Cardiovascular: Bradycardic Pulmonary: Slightly diminished at bases Abdomen: Soft, non tender, bowel sounds present. Extremities: Mild bilateral lower extremity edema. Wounds: Dressing is clean and dry.    Lab Results: CBC: Recent Labs    01/10/18 0312 01/12/18 0341  WBC 13.4* 9.1  HGB 10.3* 9.6*  HCT 31.0* 29.1*  PLT 102* 167   BMET:  Recent Labs    01/11/18 0344 01/12/18 0341  NA 137 136  K 4.3 4.4  CL 102 98*  CO2 27 29  GLUCOSE 93 94  BUN 19 21*  CREATININE 1.39* 1.68*  CALCIUM 8.4* 8.6*    PT/INR:  Lab Results  Component Value Date   INR 1.24 01/12/2018   INR 1.13 01/11/2018   INR 1.14 01/10/2018   ABG:  INR: Will add last result for INR, ABG once components are confirmed Will add last 4 CBG results once components are confirmed  Assessment/Plan:  1. CV - Previous junctional. 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 On Amiodarone 200 mg bid,  Losartan 25 mg daily, Coumadin, and Spironolactone 25 mg daily. Will decrease Amiodarone to daily. INR increased from 1.13 to 1.24. Continue with 2.5 mg of Coumadin. 2.  Pulmonary - On room air. CXR this am shows small  right apical pneumothorax.  Encourage incentive spirometer. 3. Volume Overload - On Lasix 40 mg bid. Hope to decrease to daily in am. As discussed with Dr. Owen, will hold Lasix for today. 4.  Acute blood loss anemia - H and H slightly decreased to 9.6 and 29.1. Will start Foltx. 5. Mild thrombocytopenia resolved-platelets up to 167,000 6. Creatinine increased from 1.39 to 1.68. Will stop Losartan for now as is being diuresed. Re check in am. 7. Will remove EPW later this am as nurse met resistance trying to pull yesterday  Donielle M ZimmermanPA-C 01/12/2018,7:05 AM [phone]  I have seen and examined the patient and agree with the assessment and plan as outlined.  Looks good.  Hold lasix and losartan today and recheck renal function tomorrow.  Appears euvolemic and weight below preop baseline.  Possible d/c home tomorrow or Thursday depending on renal function.  Clarence H Owen, MD 01/12/2018 5:18 PM    

## 2018-01-12 NOTE — Care Management Note (Signed)
Case Management Note Marvetta Gibbons RN, BSN Unit 4E-Case Manager [phone]  Patient Details  Name: Brooke Chavez MRN: [id number] Date of Birth: 1941/03/08  Subjective/Objective:   Pt admitted s/p MVR                Action/Plan: PTA pt lived at home with family- anticipate return home- CM to follow for transition of care needs  Expected Discharge Date:                  Expected Discharge Plan:  Home/Self Care  In-House Referral:  NA  Discharge planning Services  CM Consult  Post Acute Care Choice:    Choice offered to:     DME Arranged:    DME Agency:     HH Arranged:    [place] Agency:     Status of Service:  In process, will continue to follow  If discussed at Long Length of Stay Meetings, dates discussed:    Discharge Disposition:   Additional Comments:  Dawayne [name], RN 01/12/2018, 3:48 PM

## 2018-01-12 NOTE — Progress Notes (Signed)
CARDIAC REHAB PHASE I   PRE:  Rate/Rhythm: 11 SB 1HB  BP:  Supine: 127/45  Sitting:   Standing:    SaO2: 95%RA  MODE:  Ambulation: 630 ft   POST:  Rate/Rhythm: 77 SR 1HB  BP:  Supine: 168/66  Sitting:   Standing:    SaO2: 96%RA 1003-1035 Pt walked 630 ft on RA with rolling walker with little assistance. Tolerated well. Back to bed after walk. To BSC prior  to walk.    Graylon Good, RN BSN  01/12/2018 10:31 AM

## 2018-01-13 ENCOUNTER — Other Ambulatory Visit: Payer: Self-pay

## 2018-01-13 ENCOUNTER — Inpatient Hospital Stay (HOSPITAL_COMMUNITY): Payer: Medicare Other

## 2018-01-13 LAB — BASIC METABOLIC PANEL
Anion gap: 6 (ref 5–15)
BUN: 23 mg/dL — ABNORMAL HIGH (ref 6–20)
CO2: 29 mmol/L (ref 22–32)
Calcium: 8.5 mg/dL — ABNORMAL LOW (ref 8.9–10.3)
Chloride: 102 mmol/L (ref 101–111)
Creatinine, Ser: 1.79 mg/dL — ABNORMAL HIGH (ref 0.44–1.00)
GFR calc Af Amer: 30 mL/min — ABNORMAL LOW (ref >60)
GFR calc non Af Amer: 26 mL/min — ABNORMAL LOW (ref >60)
Glucose, Bld: 93 mg/dL (ref 65–99)
Potassium: 4.4 mmol/L (ref 3.5–5.1)
Sodium: 137 mmol/L (ref 135–145)

## 2018-01-13 LAB — PROTIME-INR
INR: 1.39
Prothrombin Time: 17 seconds — ABNORMAL HIGH (ref 11.4–15.2)

## 2018-01-13 MED ORDER — POTASSIUM CHLORIDE CRYS ER 20 MEQ PO TBCR
20.0000 meq | EXTENDED_RELEASE_TABLET | Freq: Every day | ORAL | Status: DC
  Administered 2018-01-14: 20 meq via ORAL
  Filled 2018-01-13: qty 1

## 2018-01-13 MED ORDER — FUROSEMIDE 40 MG PO TABS
40.0000 mg | ORAL_TABLET | Freq: Every day | ORAL | Status: DC
  Administered 2018-01-14: 40 mg via ORAL
  Filled 2018-01-13: qty 1

## 2018-01-13 NOTE — Progress Notes (Signed)
Nurse recommended visit for patient.  She was in very good spirits and very appreciative of visit and prayer for her healing process and peace of mind on her journey.  Conard Novak, Chaplain   01/13/18 1400  Clinical Encounter Type  Visited With Patient  Visit Type Initial;Spiritual support  Referral From Nurse  Consult/Referral To Chaplain  Spiritual Encounters  Spiritual Needs Prayer  Stress Factors  Patient Stress Factors None identified  Family Stress Factors None identified

## 2018-01-13 NOTE — Progress Notes (Signed)
CARDIAC REHAB PHASE I   PRE:  Rate/Rhythm: 69 SR  BP:  Supine:   Sitting: 157/58  Standing:    SaO2: 97 RA  MODE:  Ambulation: 606 ft   POST:  Rate/Rhythm: 123 Afib  BP:  Supine:   Sitting: 164/86  Standing:    SaO2: 96 RA 1020-1055 Assisted X 1 and used walker to ambulate. Gait steady with walker. Pt walked 606 feet without c/o. After walk heart rate 123 Afib. With rest heart rate decreased to 60's but continued to be Afib. Informed heart rate and rhythm to RN.Pt to bed after walk,she declines recliner states that she doesn't like to sit in it.Call light in reach.   Rodney Langton RN 01/13/2018 10:48 AM

## 2018-01-13 NOTE — Progress Notes (Addendum)
      [address]       [place] [address]             [phone]        6 Days Post-Op Procedure(s) (LRB): MINIMALLY INVASIVE MITRAL VALVE REPAIR using LivaNova ring size 28 MM (Right) TRANSESOPHAGEAL ECHOCARDIOGRAM (TEE) (N/A) PATENT FORAMEN OVALE (PFO) CLOSURE (N/A)  Subjective: Patient had large bowel movement yesterday. No complaints this am.  Objective: Vital signs in last 24 hours: Temp:  [97.8 F (36.6 C)-98.6 F (37 C)] 98.1 F (36.7 C) (06/05 0414) Pulse Rate:  [42-88] 44 (06/05 0414) Cardiac Rhythm: Heart block;Bundle branch block (06/05 0416) Resp:  [13-25] 13 (06/05 0414) BP: (120-148)/(47-111) 127/49 (06/05 0414) SpO2:  [77 %-99 %] 95 % (06/05 0414) Weight:  [156 lb 1.6 oz (70.8 kg)] 156 lb 1.6 oz (70.8 kg) (06/05 0414)   Pre op weight 73 kg Current Weight  01/13/18 156 lb 1.6 oz (70.8 kg)       Intake/Output from previous day: 06/04 0701 - 06/05 0700 In: 723 [P.O.:720; I.V.:3] Out: 2251 [Urine:2250; Stool:1]   Physical Exam:  Cardiovascular: Bradycardic Pulmonary: Mostly clear bilaterally Abdomen: Soft, non tender, bowel sounds present. Extremities: Trace bilateral lower extremity edema. Wounds:  Clean and dry.    Lab Results: CBC: Recent Labs    01/12/18 0341  WBC 9.1  HGB 9.6*  HCT 29.1*  PLT 167   BMET:  Recent Labs    01/12/18 0341 01/13/18 0414  NA 136 137  K 4.4 4.4  CL 98* 102  CO2 29 29  GLUCOSE 94 93  BUN 21* 23*  CREATININE 1.68* 1.79*  CALCIUM 8.6* 8.5*    PT/INR:  Lab Results  Component Value Date   INR 1.39 01/13/2018   INR 1.24 01/12/2018   INR 1.13 01/11/2018   ABG:  INR: Will add last result for INR, ABG once components are confirmed Will add last 4 CBG results once components are confirmed  Assessment/Plan:  1. CV - Previous junctional. 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 On Amiodarone 200 mg daily and Coumadin, and  Spironolactone 25 mg daily.  INR increased from 1.24 to 1.39. Continue with 2.5 mg of Coumadin. 2.  Pulmonary - On room air.  CXR this am appears stable. Encourage incentive spirometer. 3. Volume Overload - Was on Lasix 40 mg bid, which was held yesterday as creatinine increasing. Will hold again today.  4.  Acute blood loss anemia - H and H slightly decreased to 9.6 and 29.1. Will start Foltx. 5. Creatinine increased from 1.68 to 1.79. Will continue to hold diuretic and Losartan stopped yeterday. Re check in am. 6. Possible discharge in am if creatine improved  Donielle M ZimmermanPA-C 01/13/2018,7:10 AM [phone]  I have seen and examined the patient and agree with the assessment and plan as outlined.  Continue to hold losartan and diuretics.  Rexene Alberts, MD 01/13/2018 8:27 AM

## 2018-01-14 LAB — PROTIME-INR
INR: 1.54
Prothrombin Time: 18.3 seconds — ABNORMAL HIGH (ref 11.4–15.2)

## 2018-01-14 LAB — BASIC METABOLIC PANEL
Anion gap: 10 (ref 5–15)
BUN: 20 mg/dL (ref 6–20)
CO2: 26 mmol/L (ref 22–32)
Calcium: 8.7 mg/dL — ABNORMAL LOW (ref 8.9–10.3)
Chloride: 100 mmol/L — ABNORMAL LOW (ref 101–111)
Creatinine, Ser: 1.62 mg/dL — ABNORMAL HIGH (ref 0.44–1.00)
GFR calc Af Amer: 34 mL/min — ABNORMAL LOW (ref >60)
GFR calc non Af Amer: 30 mL/min — ABNORMAL LOW (ref >60)
Glucose, Bld: 95 mg/dL (ref 65–99)
Potassium: 4.4 mmol/L (ref 3.5–5.1)
Sodium: 136 mmol/L (ref 135–145)

## 2018-01-14 MED ORDER — SPIRONOLACTONE 25 MG PO TABS: 25.0000 mg | ORAL_TABLET | Freq: Every day | ORAL | 1 refills | Status: DC

## 2018-01-14 MED ORDER — AMIODARONE HCL 200 MG PO TABS: 200.0000 mg | ORAL_TABLET | Freq: Every day | ORAL | 1 refills | Status: DC

## 2018-01-14 MED ORDER — FERROUS SULFATE 325 (65 FE) MG PO TABS: 325.0000 mg | ORAL_TABLET | Freq: Every day | ORAL | 3 refills | Status: DC

## 2018-01-14 MED ORDER — PATIENT'S GUIDE TO USING COUMADIN BOOK
Freq: Once | Status: DC
  Filled 2018-01-14 (×2): qty 1

## 2018-01-14 MED ORDER — TRAMADOL HCL 50 MG PO TABS: ORAL_TABLET | ORAL | 0 refills | Status: DC

## 2018-01-14 MED ORDER — HYDROCHLOROTHIAZIDE 25 MG PO TABS: 25.0000 mg | ORAL_TABLET | Freq: Every day | ORAL | 1 refills | Status: DC

## 2018-01-14 MED ORDER — WARFARIN SODIUM 2.5 MG PO TABS: 2.5000 mg | ORAL_TABLET | Freq: Every day | ORAL | 0 refills | Status: DC

## 2018-01-14 NOTE — Consult Note (Signed)
[facility] CM Primary Care Navigator  01/14/2018  Brooke Chavez [birth year] [phone]   Met withpatient and sister at the bedsideto identify possible discharge needs. Patientreportsbeing referred for surgical consultation to discuss treatment option for management of mitral valve prolapse with severe primary mitral regurgitation that resulted to this admission/ surgery. (minimally invasive mitral valve repair)  Patient endorses Brooke Chavez [place], [place] [facility] as her primary care provider.   [facility] [name] [place].  Patient states managingher own medications at [facility] use of "pill box" system filled weekly.  Patient has been driving prior to admission/ surgerybut her children and siblings can providetransportation to herdoctors'appointments after discharge.  Patient's daughter Brooke Chavez) and grandson Brooke Chavez) lives with her, who can assist with her needs at home. Patient's sister verbalized that patient has "plenty of family support" to assist her once discharged.  Anticipateddischarge planishomeper patient.  Patientvoiced understanding to call primary care provider's office when she returnshome, for a post discharge follow-upwithin1- 2weeksor sooner if needs arise.Patient letter (with PCP's contact number) was provided asareminder.   Discussed withpatientand sister regarding [facility] CM services available for health management and resources at home butpatient deniesany needs or concerns at this point. She mentioned being capable of managing her health needs so far, as what she has been doing prior to this hospitalization. Patientverbalized understandingto seekreferral from primary care provider to [facility] care management if deemednecessaryand appropriate for anyservices in the future.  [facility] care management information was provided for  future needs thatshe may have.  Patienthad onlyverbally agreedand optedforEMMIcalls tofollowup herrecoveryat home.   Referral made for [facility] General calls after discharge.   For additional questions please contact:  Edwena Felty A. Jaleel Allen, BSN, RN-BC [facility] PRIMARY CARE Navigator Cell: [phone]

## 2018-01-14 NOTE — Progress Notes (Addendum)
      [address]       [place] [address]             [phone]        7 Days Post-Op Procedure(s) (LRB): MINIMALLY INVASIVE MITRAL VALVE REPAIR using LivaNova ring size 28 MM (Right) TRANSESOPHAGEAL ECHOCARDIOGRAM (TEE) (N/A) PATENT FORAMEN OVALE (PFO) CLOSURE (N/A)  Subjective: Patient had large bowel movement yesterday. No complaints this am.  Objective: Vital signs in last 24 hours: Temp:  [98.2 F (36.8 C)-98.9 F (37.2 C)] 98.2 F (36.8 C) (06/06 0406) Pulse Rate:  [55-78] 55 (06/06 0406) Cardiac Rhythm: Heart block (06/06 0406) Resp:  [8-24] 8 (06/06 0406) BP: (111-134)/(51-66) 123/51 (06/06 0406) SpO2:  [91 %-100 %] 97 % (06/06 0406) Weight:  [156 lb 4.8 oz (70.9 kg)] 156 lb 4.8 oz (70.9 kg) (06/06 0406)   Pre op weight 73 kg Current Weight  01/14/18 156 lb 4.8 oz (70.9 kg)       Intake/Output from previous day: 06/05 0701 - 06/06 0700 In: 240 [P.O.:240] Out: 700 [Urine:700]   Physical Exam:  Cardiovascular: RRR Pulmonary: Mostly clear bilaterally Abdomen: Soft, non tender, bowel sounds present. Extremities: Trace bilateral lower extremity edema. Wounds:  Clean and dry.    Lab Results: CBC: Recent Labs    01/12/18 0341  WBC 9.1  HGB 9.6*  HCT 29.1*  PLT 167   BMET:  Recent Labs    01/13/18 0414 01/14/18 0318  NA 137 136  K 4.4 4.4  CL 102 100*  CO2 29 26  GLUCOSE 93 95  BUN 23* 20  CREATININE 1.79* 1.62*  CALCIUM 8.5* 8.7*    PT/INR:  Lab Results  Component Value Date   INR 1.54 01/14/2018   INR 1.39 01/13/2018   INR 1.24 01/12/2018   ABG:  INR: Will add last result for INR, ABG once components are confirmed Will add last 4 CBG results once components are confirmed  Assessment/Plan:  1. CV - Previous junctional. First degree heart block, bradycardia of late (she has a history of bradycardia, is NOT on BB).Of note, cardiac rehab stated she was in a fib yesterday and night nurse also said she was at  times in a fib with CVR. On Amiodarone 200 mg daily and Coumadin, and Spironolactone 25 mg daily.  INR increased from 1.39 to 1.54. Likely continue with 2.5 mg of Coumadin at discharge. 2.  Pulmonary - On room air.  CXR this am appears stable. Encourage incentive spirometer. 3. Volume Overload - Was on Lasix 40 mg bid, which has held  as creatinine increasing. Will discuss with Dr. Roxy Manns whether or not needs daily Lasix at discharge. 4.  Acute blood loss anemia - Last H and H slightly decreased to 9.6 and 29.1. On Foltx. 5. Creatinine decreased from 1.79 to 1.62. Diuretic and Losartan stopped a few days ago.  6. Likely discharge as creatinine decreased   Brooke M ZimmermanPA-C 01/14/2018,7:09 AM [phone]  I have seen and examined the patient and agree with the assessment and plan as outlined.  Rexene Alberts, MD 01/14/2018 11:48 AM

## 2018-01-14 NOTE — Progress Notes (Signed)
CARDIAC REHAB PHASE I   PRE:  Rate/Rhythm: Atrial Fib 89  BP:  Supine: 127/45     SaO2: 94% Room Air  MODE:  Ambulation: 600 ft   POST:  Rate/Rhythem: 80  BP:  Supine: 149/64     SaO2: 97% room Air  0900-1000 Patient ambulated in the hallway independently using a rolling walker without complaints or difficulty. Reviewed exercise instructions, sternal precautions use of incentive spirometer. Discussed heart healthy diet and set up discharge open heart surgery video for patient to see. Referral placed for [facility] phase 2.    [name] RN BSN

## 2018-01-14 NOTE — Care Management Note (Signed)
Case Management Note Marvetta Gibbons RN, BSN Unit 4E-Case Manager [phone]  Patient Details  Name: Brooke Chavez MRN: [id number] Date of Birth: 12/19/1940  Subjective/Objective:   Pt admitted s/p MVR                Action/Plan: PTA pt lived at home with family- anticipate return home- CM to follow for transition of care needs  Expected Discharge Date:  01/14/18               Expected Discharge Plan:  Home/Self Care  In-House Referral:  NA  Discharge planning Services  CM Consult  Post Acute Care Choice:  NA Choice offered to:  NA  DME Arranged:    DME Agency:     HH Arranged:    [place] Agency:     Status of Service:  Completed, signed off  If discussed at [place] of Stay Meetings, dates discussed:    Discharge Disposition: home/self care   Additional Comments:  01/14/18 1145- Kathreen Dileo RN, CM- pt for transition home today- no CM need noted for discharge home.   Dawayne [name], RN 01/14/2018, 11:52 AM

## 2018-01-15 LAB — TYPE AND SCREEN
ABO/RH(D): O NEG
Antibody Screen: NEGATIVE
Unit division: 0
Unit division: 0

## 2018-01-15 LAB — BPAM RBC
Blood Product Expiration Date: 201906122359
Blood Product Expiration Date: 201906152359
ISSUE DATE / TIME: 201905300841
ISSUE DATE / TIME: 201905300841
Unit Type and Rh: 9500
Unit Type and Rh: 9500

## 2018-01-18 ENCOUNTER — Ambulatory Visit (INDEPENDENT_AMBULATORY_CARE_PROVIDER_SITE_OTHER): Payer: Medicare Other | Admitting: Pharmacist

## 2018-01-18 DIAGNOSIS — I483 Typical atrial flutter: Secondary | ICD-10-CM | POA: Diagnosis not present

## 2018-01-18 DIAGNOSIS — Z9889 Other specified postprocedural states: Secondary | ICD-10-CM | POA: Diagnosis not present

## 2018-01-18 DIAGNOSIS — Z5181 Encounter for therapeutic drug level monitoring: Secondary | ICD-10-CM

## 2018-01-18 LAB — POCT INR: INR: 1.5 — AB (ref 2.0–3.0)

## 2018-01-18 NOTE — Patient Instructions (Addendum)
A full discussion of the nature of anticoagulants has been carried out.  A benefit risk analysis has been presented to the patient, so that they understand the justification for choosing anticoagulation at this time. The need for frequent and regular monitoring, precise dosage adjustment and compliance is stressed.  Side effects of potential bleeding are discussed.  The patient should avoid any OTC items containing aspirin or ibuprofen, and should avoid great swings in general diet.  Avoid alcohol consumption.  Call if any signs of abnormal bleeding. If you have any invasive procedures or get prescribed antibiotics please call the office and let us know.   Start taking 1 tablet daily except 2 tablets on Mondays, Wednesdays, and Fridays. Recheck INR in 1 week. Call the Coumadin clinic with any concerns [phone].

## 2018-01-19 ENCOUNTER — Telehealth: Payer: Self-pay

## 2018-01-19 NOTE — Telephone Encounter (Signed)
Called patient about referral from Dr. [name] for med/onc evaluation.  I had called patient on 12/09/17 and she told me that she would like to deal with her heart issues first. Patient had cardiac procedure completed 01/07/18. Patient remains adamant that she does not want to come in and speak with an oncologist. I attempted to give her my direct phone number but she refused to take it. I will let Dr. [name] know. Patient aware that she can reach out to new patient scheduling if she wants to schedule apt in the future.

## 2018-01-21 ENCOUNTER — Telehealth: Payer: Self-pay

## 2018-01-21 NOTE — Telephone Encounter (Signed)
Returning a call from Ms. Corinna Capra.  She stated that she was unsure what she was to do with the surgical incision and dressing when and after showering.  She stated that when it got wet during showering, she would have her daughter change the dressing.  I advised her that if she did not have any drainage or opening of the incisions, she could leave it open to air and let water and soap run down the area and pat dry.  She acknowledged receipt and is aware of her follow-up appointment on Monday with Dr. Roxy Manns.

## 2018-01-22 ENCOUNTER — Other Ambulatory Visit: Payer: Self-pay | Admitting: Thoracic Surgery (Cardiothoracic Vascular Surgery)

## 2018-01-22 DIAGNOSIS — Z9889 Other specified postprocedural states: Secondary | ICD-10-CM

## 2018-01-24 ENCOUNTER — Emergency Department (HOSPITAL_COMMUNITY): Payer: Medicare Other

## 2018-01-24 ENCOUNTER — Inpatient Hospital Stay (HOSPITAL_COMMUNITY)
Admission: EM | Admit: 2018-01-24 | Discharge: 2018-01-27 | DRG: 064 | Disposition: A | Discharge status: Rehabilitation Facility | Payer: Medicare Other | Attending: Family Medicine | Admitting: Family Medicine

## 2018-01-24 ENCOUNTER — Encounter (HOSPITAL_COMMUNITY)
Admission: EM | Disposition: A | Discharge status: Rehabilitation Facility | Payer: Self-pay | Source: Home / Self Care | Attending: Family Medicine

## 2018-01-24 ENCOUNTER — Inpatient Hospital Stay (HOSPITAL_COMMUNITY): Payer: Medicare Other

## 2018-01-24 ENCOUNTER — Encounter (HOSPITAL_COMMUNITY): Payer: Self-pay

## 2018-01-24 ENCOUNTER — Emergency Department (HOSPITAL_COMMUNITY): Payer: Medicare Other | Admitting: Certified Registered Nurse Anesthetist

## 2018-01-24 DIAGNOSIS — I110 Hypertensive heart disease with heart failure: Secondary | ICD-10-CM | POA: Diagnosis present

## 2018-01-24 DIAGNOSIS — I63512 Cerebral infarction due to unspecified occlusion or stenosis of left middle cerebral artery: Secondary | ICD-10-CM | POA: Diagnosis not present

## 2018-01-24 DIAGNOSIS — Z7901 Long term (current) use of anticoagulants: Secondary | ICD-10-CM | POA: Diagnosis not present

## 2018-01-24 DIAGNOSIS — R1310 Dysphagia, unspecified: Secondary | ICD-10-CM | POA: Diagnosis present

## 2018-01-24 DIAGNOSIS — Z66 Do not resuscitate: Secondary | ICD-10-CM | POA: Diagnosis present

## 2018-01-24 DIAGNOSIS — I080 Rheumatic disorders of both mitral and aortic valves: Secondary | ICD-10-CM | POA: Diagnosis not present

## 2018-01-24 DIAGNOSIS — I639 Cerebral infarction, unspecified: Secondary | ICD-10-CM

## 2018-01-24 DIAGNOSIS — I482 Chronic atrial fibrillation: Secondary | ICD-10-CM | POA: Diagnosis not present

## 2018-01-24 DIAGNOSIS — R4701 Aphasia: Secondary | ICD-10-CM | POA: Diagnosis not present

## 2018-01-24 DIAGNOSIS — I5032 Chronic diastolic (congestive) heart failure: Secondary | ICD-10-CM | POA: Diagnosis present

## 2018-01-24 DIAGNOSIS — Z823 Family history of stroke: Secondary | ICD-10-CM

## 2018-01-24 DIAGNOSIS — Z8249 Family history of ischemic heart disease and other diseases of the circulatory system: Secondary | ICD-10-CM

## 2018-01-24 DIAGNOSIS — D62 Acute posthemorrhagic anemia: Secondary | ICD-10-CM | POA: Diagnosis present

## 2018-01-24 DIAGNOSIS — K869 Disease of pancreas, unspecified: Secondary | ICD-10-CM | POA: Diagnosis not present

## 2018-01-24 DIAGNOSIS — I483 Typical atrial flutter: Secondary | ICD-10-CM | POA: Diagnosis not present

## 2018-01-24 DIAGNOSIS — I4589 Other specified conduction disorders: Secondary | ICD-10-CM | POA: Diagnosis present

## 2018-01-24 DIAGNOSIS — M1990 Unspecified osteoarthritis, unspecified site: Secondary | ICD-10-CM | POA: Diagnosis present

## 2018-01-24 DIAGNOSIS — R4020 Unspecified coma: Secondary | ICD-10-CM | POA: Diagnosis not present

## 2018-01-24 DIAGNOSIS — Z515 Encounter for palliative care: Secondary | ICD-10-CM

## 2018-01-24 DIAGNOSIS — Z9889 Other specified postprocedural states: Secondary | ICD-10-CM

## 2018-01-24 DIAGNOSIS — H5347 Heteronymous bilateral field defects: Secondary | ICD-10-CM | POA: Diagnosis present

## 2018-01-24 DIAGNOSIS — I4891 Unspecified atrial fibrillation: Secondary | ICD-10-CM | POA: Diagnosis not present

## 2018-01-24 DIAGNOSIS — I69392 Facial weakness following cerebral infarction: Secondary | ICD-10-CM | POA: Diagnosis not present

## 2018-01-24 DIAGNOSIS — I6789 Other cerebrovascular disease: Secondary | ICD-10-CM | POA: Diagnosis not present

## 2018-01-24 DIAGNOSIS — I69320 Aphasia following cerebral infarction: Secondary | ICD-10-CM | POA: Diagnosis not present

## 2018-01-24 DIAGNOSIS — R011 Cardiac murmur, unspecified: Secondary | ICD-10-CM | POA: Diagnosis present

## 2018-01-24 DIAGNOSIS — I63412 Cerebral infarction due to embolism of left middle cerebral artery: Principal | ICD-10-CM | POA: Diagnosis present

## 2018-01-24 DIAGNOSIS — Z96653 Presence of artificial knee joint, bilateral: Secondary | ICD-10-CM | POA: Diagnosis present

## 2018-01-24 DIAGNOSIS — Z885 Allergy status to narcotic agent status: Secondary | ICD-10-CM | POA: Diagnosis not present

## 2018-01-24 DIAGNOSIS — R160 Hepatomegaly, not elsewhere classified: Secondary | ICD-10-CM | POA: Diagnosis present

## 2018-01-24 DIAGNOSIS — Z886 Allergy status to analgesic agent status: Secondary | ICD-10-CM

## 2018-01-24 DIAGNOSIS — I480 Paroxysmal atrial fibrillation: Secondary | ICD-10-CM | POA: Diagnosis not present

## 2018-01-24 DIAGNOSIS — I361 Nonrheumatic tricuspid (valve) insufficiency: Secondary | ICD-10-CM | POA: Diagnosis not present

## 2018-01-24 DIAGNOSIS — E785 Hyperlipidemia, unspecified: Secondary | ICD-10-CM | POA: Diagnosis not present

## 2018-01-24 DIAGNOSIS — Z8679 Personal history of other diseases of the circulatory system: Secondary | ICD-10-CM | POA: Diagnosis not present

## 2018-01-24 DIAGNOSIS — R29810 Facial weakness: Secondary | ICD-10-CM | POA: Diagnosis present

## 2018-01-24 DIAGNOSIS — R531 Weakness: Secondary | ICD-10-CM | POA: Diagnosis not present

## 2018-01-24 DIAGNOSIS — E46 Unspecified protein-calorie malnutrition: Secondary | ICD-10-CM | POA: Diagnosis not present

## 2018-01-24 DIAGNOSIS — Z79899 Other long term (current) drug therapy: Secondary | ICD-10-CM | POA: Diagnosis not present

## 2018-01-24 DIAGNOSIS — G936 Cerebral edema: Secondary | ICD-10-CM | POA: Diagnosis present

## 2018-01-24 DIAGNOSIS — I6389 Other cerebral infarction: Secondary | ICD-10-CM | POA: Diagnosis not present

## 2018-01-24 DIAGNOSIS — T1490XA Injury, unspecified, initial encounter: Secondary | ICD-10-CM | POA: Diagnosis not present

## 2018-01-24 DIAGNOSIS — I071 Rheumatic tricuspid insufficiency: Secondary | ICD-10-CM | POA: Diagnosis present

## 2018-01-24 DIAGNOSIS — I6602 Occlusion and stenosis of left middle cerebral artery: Secondary | ICD-10-CM | POA: Diagnosis not present

## 2018-01-24 DIAGNOSIS — K219 Gastro-esophageal reflux disease without esophagitis: Secondary | ICD-10-CM | POA: Diagnosis present

## 2018-01-24 DIAGNOSIS — G8191 Hemiplegia, unspecified affecting right dominant side: Secondary | ICD-10-CM | POA: Diagnosis present

## 2018-01-24 DIAGNOSIS — I440 Atrioventricular block, first degree: Secondary | ICD-10-CM | POA: Diagnosis present

## 2018-01-24 DIAGNOSIS — I130 Hypertensive heart and chronic kidney disease with heart failure and stage 1 through stage 4 chronic kidney disease, or unspecified chronic kidney disease: Secondary | ICD-10-CM | POA: Diagnosis present

## 2018-01-24 DIAGNOSIS — I69992 Facial weakness following unspecified cerebrovascular disease: Secondary | ICD-10-CM | POA: Diagnosis not present

## 2018-01-24 DIAGNOSIS — D638 Anemia in other chronic diseases classified elsewhere: Secondary | ICD-10-CM | POA: Diagnosis present

## 2018-01-24 DIAGNOSIS — I69398 Other sequelae of cerebral infarction: Secondary | ICD-10-CM | POA: Diagnosis not present

## 2018-01-24 DIAGNOSIS — R29725 NIHSS score 25: Secondary | ICD-10-CM | POA: Diagnosis present

## 2018-01-24 DIAGNOSIS — Z833 Family history of diabetes mellitus: Secondary | ICD-10-CM

## 2018-01-24 DIAGNOSIS — I69351 Hemiplegia and hemiparesis following cerebral infarction affecting right dominant side: Secondary | ICD-10-CM | POA: Diagnosis not present

## 2018-01-24 DIAGNOSIS — R269 Unspecified abnormalities of gait and mobility: Secondary | ICD-10-CM | POA: Diagnosis not present

## 2018-01-24 DIAGNOSIS — Z7189 Other specified counseling: Secondary | ICD-10-CM

## 2018-01-24 DIAGNOSIS — Z952 Presence of prosthetic heart valve: Secondary | ICD-10-CM | POA: Diagnosis not present

## 2018-01-24 DIAGNOSIS — R0689 Other abnormalities of breathing: Secondary | ICD-10-CM | POA: Diagnosis not present

## 2018-01-24 DIAGNOSIS — J9811 Atelectasis: Secondary | ICD-10-CM | POA: Diagnosis not present

## 2018-01-24 DIAGNOSIS — I447 Left bundle-branch block, unspecified: Secondary | ICD-10-CM | POA: Diagnosis present

## 2018-01-24 DIAGNOSIS — E876 Hypokalemia: Secondary | ICD-10-CM | POA: Diagnosis present

## 2018-01-24 DIAGNOSIS — I10 Essential (primary) hypertension: Secondary | ICD-10-CM | POA: Diagnosis not present

## 2018-01-24 DIAGNOSIS — G459 Transient cerebral ischemic attack, unspecified: Secondary | ICD-10-CM | POA: Diagnosis not present

## 2018-01-24 DIAGNOSIS — R402441 Other coma, without documented Glasgow coma scale score, or with partial score reported, in the field [EMT or ambulance]: Secondary | ICD-10-CM | POA: Diagnosis not present

## 2018-01-24 DIAGNOSIS — J939 Pneumothorax, unspecified: Secondary | ICD-10-CM | POA: Diagnosis not present

## 2018-01-24 DIAGNOSIS — N390 Urinary tract infection, site not specified: Secondary | ICD-10-CM | POA: Diagnosis present

## 2018-01-24 DIAGNOSIS — I69391 Dysphagia following cerebral infarction: Secondary | ICD-10-CM | POA: Diagnosis not present

## 2018-01-24 DIAGNOSIS — N183 Chronic kidney disease, stage 3 (moderate): Secondary | ICD-10-CM | POA: Diagnosis present

## 2018-01-24 DIAGNOSIS — I6930 Unspecified sequelae of cerebral infarction: Secondary | ICD-10-CM | POA: Diagnosis present

## 2018-01-24 LAB — DIFFERENTIAL
Basophils Absolute: 0 10*3/uL (ref 0.0–0.1)
Basophils Relative: 0 %
Eosinophils Absolute: 0.1 10*3/uL (ref 0.0–0.7)
Eosinophils Relative: 1 %
Lymphocytes Relative: 9 %
Lymphs Abs: 1 10*3/uL (ref 0.7–4.0)
Monocytes Absolute: 1 10*3/uL (ref 0.1–1.0)
Monocytes Relative: 9 %
Neutro Abs: 9.3 10*3/uL — ABNORMAL HIGH (ref 1.7–7.7)
Neutrophils Relative %: 81 %

## 2018-01-24 LAB — CBC
HCT: 31 % — ABNORMAL LOW (ref 36.0–46.0)
Hemoglobin: 10.1 g/dL — ABNORMAL LOW (ref 12.0–15.0)
MCH: 32 pg (ref 26.0–34.0)
MCHC: 32.6 g/dL (ref 30.0–36.0)
MCV: 98.1 fL (ref 78.0–100.0)
Platelets: 346 10*3/uL (ref 150–400)
RBC: 3.16 MIL/uL — ABNORMAL LOW (ref 3.87–5.11)
RDW: 14.1 % (ref 11.5–15.5)
WBC: 11.4 10*3/uL — ABNORMAL HIGH (ref 4.0–10.5)

## 2018-01-24 LAB — COMPREHENSIVE METABOLIC PANEL
ALT: 28 U/L (ref 14–54)
AST: 32 U/L (ref 15–41)
Albumin: 3.3 g/dL — ABNORMAL LOW (ref 3.5–5.0)
Alkaline Phosphatase: 61 U/L (ref 38–126)
Anion gap: 11 (ref 5–15)
BUN: 25 mg/dL — ABNORMAL HIGH (ref 6–20)
CO2: 21 mmol/L — ABNORMAL LOW (ref 22–32)
Calcium: 8.9 mg/dL (ref 8.9–10.3)
Chloride: 108 mmol/L (ref 101–111)
Creatinine, Ser: 1.84 mg/dL — ABNORMAL HIGH (ref 0.44–1.00)
GFR calc Af Amer: 29 mL/min — ABNORMAL LOW (ref >60)
GFR calc non Af Amer: 25 mL/min — ABNORMAL LOW (ref >60)
Glucose, Bld: 107 mg/dL — ABNORMAL HIGH (ref 65–99)
Potassium: 4.2 mmol/L (ref 3.5–5.1)
Sodium: 140 mmol/L (ref 135–145)
Total Bilirubin: 0.6 mg/dL (ref 0.3–1.2)
Total Protein: 7 g/dL (ref 6.5–8.1)

## 2018-01-24 LAB — I-STAT CHEM 8, ED
BUN: 23 mg/dL — ABNORMAL HIGH (ref 6–20)
Calcium, Ion: 1.18 mmol/L (ref 1.15–1.40)
Chloride: 108 mmol/L (ref 101–111)
Creatinine, Ser: 1.7 mg/dL — ABNORMAL HIGH (ref 0.44–1.00)
Glucose, Bld: 104 mg/dL — ABNORMAL HIGH (ref 65–99)
HCT: 30 % — ABNORMAL LOW (ref 36.0–46.0)
Hemoglobin: 10.2 g/dL — ABNORMAL LOW (ref 12.0–15.0)
Potassium: 4.4 mmol/L (ref 3.5–5.1)
Sodium: 139 mmol/L (ref 135–145)
TCO2: 18 mmol/L — ABNORMAL LOW (ref 22–32)

## 2018-01-24 LAB — URINALYSIS, ROUTINE W REFLEX MICROSCOPIC
Bilirubin Urine: NEGATIVE
Glucose, UA: NEGATIVE mg/dL
Hgb urine dipstick: NEGATIVE
Ketones, ur: NEGATIVE mg/dL
Leukocytes, UA: NEGATIVE
Nitrite: NEGATIVE
Protein, ur: NEGATIVE mg/dL
Specific Gravity, Urine: 1.032 — ABNORMAL HIGH (ref 1.005–1.030)
pH: 5 (ref 5.0–8.0)

## 2018-01-24 LAB — RAPID URINE DRUG SCREEN, HOSP PERFORMED
Amphetamines: NOT DETECTED
Benzodiazepines: NOT DETECTED
Cocaine: NOT DETECTED
Opiates: NOT DETECTED
Tetrahydrocannabinol: NOT DETECTED

## 2018-01-24 LAB — APTT: aPTT: 34 seconds (ref 24–36)

## 2018-01-24 LAB — ETHANOL: Alcohol, Ethyl (B): <10 mg/dL (ref <10)

## 2018-01-24 LAB — I-STAT TROPONIN, ED: Troponin i, poc: 0.4 ng/mL (ref 0.00–0.08)

## 2018-01-24 LAB — PROTIME-INR
INR: 1.79
Prothrombin Time: 20.7 seconds — ABNORMAL HIGH (ref 11.4–15.2)

## 2018-01-24 SURGERY — RADIOLOGY WITH ANESTHESIA
Anesthesia: General

## 2018-01-24 MED ORDER — CLOPIDOGREL BISULFATE 300 MG PO TABS
ORAL_TABLET | ORAL | Status: AC
  Filled 2018-01-24: qty 1

## 2018-01-24 MED ORDER — NITROGLYCERIN 1 MG/10 ML FOR IR/CATH LAB
INTRA_ARTERIAL | Status: AC
  Filled 2018-01-24: qty 10

## 2018-01-24 MED ORDER — TIROFIBAN HCL IN NACL 5-0.9 MG/100ML-% IV SOLN
INTRAVENOUS | Status: AC
  Filled 2018-01-24: qty 100

## 2018-01-24 MED ORDER — EPTIFIBATIDE 20 MG/10ML IV SOLN
INTRAVENOUS | Status: AC
  Filled 2018-01-24: qty 10

## 2018-01-24 MED ORDER — ASPIRIN 325 MG PO TABS
ORAL_TABLET | ORAL | Status: AC
  Filled 2018-01-24: qty 1

## 2018-01-24 MED ORDER — BISACODYL 10 MG RE SUPP: 10.0000 mg | Freq: Every day | RECTAL | Status: DC | PRN

## 2018-01-24 MED ORDER — ONDANSETRON HCL 4 MG/2ML IJ SOLN: 4.0000 mg | Freq: Four times a day (QID) | INTRAMUSCULAR | Status: DC | PRN

## 2018-01-24 MED ORDER — STROKE: EARLY STAGES OF RECOVERY BOOK
Freq: Once | Status: DC
  Filled 2018-01-24: qty 1

## 2018-01-24 MED ORDER — ASPIRIN 300 MG RE SUPP
300.0000 mg | Freq: Once | RECTAL | Status: AC
  Administered 2018-01-24: 300 mg via RECTAL
  Filled 2018-01-24: qty 1

## 2018-01-24 MED ORDER — LIDOCAINE HCL 1 % IJ SOLN
INTRAMUSCULAR | Status: AC
  Filled 2018-01-24: qty 20

## 2018-01-24 MED ORDER — SENNOSIDES-DOCUSATE SODIUM 8.6-50 MG PO TABS: 1.0000 | ORAL_TABLET | Freq: Every evening | ORAL | Status: DC | PRN

## 2018-01-24 MED ORDER — ACETAMINOPHEN 325 MG PO TABS: 650.0000 mg | ORAL_TABLET | Freq: Four times a day (QID) | ORAL | Status: DC | PRN

## 2018-01-24 MED ORDER — HYDRALAZINE HCL 20 MG/ML IJ SOLN: 5.0000 mg | Freq: Three times a day (TID) | INTRAMUSCULAR | Status: DC | PRN

## 2018-01-24 MED ORDER — ASPIRIN 300 MG RE SUPP
300.0000 mg | Freq: Every day | RECTAL | Status: DC
  Administered 2018-01-26: 300 mg via RECTAL
  Filled 2018-01-24: qty 1

## 2018-01-24 MED ORDER — HYDROCODONE-ACETAMINOPHEN 5-325 MG PO TABS: 1.0000 | ORAL_TABLET | ORAL | Status: DC | PRN

## 2018-01-24 MED ORDER — SODIUM CHLORIDE 0.9 % IV SOLN
INTRAVENOUS | Status: DC
  Administered 2018-01-24 – 2018-01-27 (×4): via INTRAVENOUS

## 2018-01-24 MED ORDER — LORAZEPAM 2 MG/ML IJ SOLN: 0.2500 mg | Freq: Four times a day (QID) | INTRAMUSCULAR | Status: DC | PRN

## 2018-01-24 MED ORDER — IOPAMIDOL (ISOVUE-370) INJECTION 76%
125.0000 mL | Freq: Once | INTRAVENOUS | Status: AC | PRN
  Administered 2018-01-24: 120 mL via INTRAVENOUS

## 2018-01-24 MED ORDER — ACETAMINOPHEN 650 MG RE SUPP: 650.0000 mg | Freq: Four times a day (QID) | RECTAL | Status: DC | PRN

## 2018-01-24 MED ORDER — FENTANYL CITRATE (PF) 100 MCG/2ML IJ SOLN
INTRAMUSCULAR | Status: AC
  Filled 2018-01-24: qty 2

## 2018-01-24 MED ORDER — ONDANSETRON HCL 4 MG PO TABS: 4.0000 mg | ORAL_TABLET | Freq: Four times a day (QID) | ORAL | Status: DC | PRN

## 2018-01-24 MED ORDER — TICAGRELOR 90 MG PO TABS
ORAL_TABLET | ORAL | Status: AC
  Filled 2018-01-24: qty 2

## 2018-01-24 NOTE — Consult Note (Addendum)
Neurology Consultation  Reason for Consult: code stroke Referring Physician: Dr. Kathrynn Humble  CC: Stroke  History is obtained from: Patient, chart  HPI: Brooke Chavez is a 77 y.o. female past medical history of atrial flutter, mitral regurgitation status post mitral valve repair 2 weeks ago, bradycardia, hyperlipidemia, who was in her usual state of health last known normal 10 PM 01/23/2018, noted to be weak on the right side and aphasic this morning.  Brought to [facility] for evaluation. 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 Noncontrast CT of the head showed a poor aspects-4 but the CT angiogram and perfusion study showed a score of 37 cc and penumbra of 115 cc. I received a call from the emergency room doctor at [facility] hospital, regarding this patient and potential transfer for intervention.  Patient by report is an  modified Rankin score of 1-2, only recently requiring walker after her cardiac surgery but otherwise able to take care of all her affairs and not requiring assistance for any ADLs. I spoke with the family member over the phone at [facility], and she was not sure if they would like to proceed with the surgery given the poor aspects and the risk of hemorrhage with such CT.  She asked for time to speak with her brother, who was coming from [place] of state.  In the interim, I initiated a transfer from ER to ER so that there is no delay in transferring the patient should they decide to proceed with the procedure.  I called the patient family back while the patient is being transported and the daughter said that they would like to proceed with the procedure.  At that time, I initiated the IR code stroke. Patient arrived at [facility].  Was taken directly to the IR suite.  The family arrived a little while  after the patient's arrival.  Dr. Estanislado Pandy and I spoke with the family members, explained imaging findings and her clinical picture.  After detailed discussions of risks and benefits, the family decided not to pursue IR procedure given the risk of bleeding.   LKW: 10 PM on 01/23/2018 tpa given?: no, initially evaluated at [facility], outside the window at presentation. Premorbid modified Rankin scale (mRS):1-2  ROS: Unable to obtain due to altered mental status.   Past Medical History:  Diagnosis Date  . Atrial flutter ([place])    Typical by EKG diagnosis 9/11 s/p CIT ablation 11/11  . Bradycardia   . Chronic diastolic congestive heart failure ([place])   . DJD (degenerative joint disease)   . Dysrhythmia   . Femur fracture, left ([place]) 2013  . Heart murmur   . Hyperlipidemia    x5 years  . Hypertension    Since 1997  . Liver masses 11/13/2017   Multiple small nodules seen on CT and MRI  . Mitral regurgitation    severe  . 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 TR (tricuspid regurgitation)    Mild with RA enlargment    Family History  Problem Relation Age of Onset  . Hypertension Mother   . Cancer Sister  leukemia  . Diabetes Brother   . Stroke Sister   . Diabetes Brother   . Heart attack Brother   . Healthy Daughter   . Healthy Son   . Healthy Daughter    Social History:   reports that she has never smoked. She has never used smokeless tobacco. She reports that she does not drink alcohol or use drugs.  Medications  Current Facility-Administered Medications:  .  aspirin 325 MG tablet, , , ,  .  clopidogrel (PLAVIX) 300 MG tablet, , , ,  .  eptifibatide (INTEGRILIN) 20 MG/10ML injection, , , ,  .  lidocaine (XYLOCAINE) 1 % (with pres) injection, , , ,  .  nitroGLYCERIN  100 MCG/ML intra-arterial injection, , , ,  .  ticagrelor (BRILINTA) 90 MG tablet, , , ,  .  tirofiban (AGGRASTAT) 5-0.9 MG/100ML-% injection, , , ,   Current Outpatient Medications:  .  acetaminophen (TYLENOL) 500 MG tablet, Take 1,000 mg by mouth every 6 (six) hours as needed for moderate pain or headache., Disp: , Rfl:  .  amiodarone (PACERONE) 200 MG tablet, Take 1 tablet (200 mg total) by mouth daily., Disp: 30 tablet, Rfl: 1 .  atorvastatin (LIPITOR) 40 MG tablet, Take 1 tablet (40 mg total) by mouth at bedtime., Disp: 90 tablet, Rfl: 1 .  ferrous sulfate 325 (65 FE) MG tablet, Take 1 tablet (325 mg total) by mouth daily with breakfast. For one month then stop., Disp: 30 tablet, Rfl: 3 .  hydrochlorothiazide (HYDRODIURIL) 25 MG tablet, Take 1 tablet (25 mg total) by mouth daily., Disp: 30 tablet, Rfl: 1 .  warfarin (COUMADIN) 2.5 MG tablet, Take 1 tablet (2.5 mg total) by mouth daily at 6 PM. Or as directed, Disp: 30 tablet, Rfl: 0   Exam: Current vital signs: BP (!) 194/64 (BP Location: Left Arm)   Pulse (!) 47   Temp 97.9 F (36.6 C) (Rectal)   Resp 18   Wt 71.7 kg (158 lb)   LMP 11/05/1992   SpO2 100%   BMI 27.99 kg/m  Vital signs in last 24 hours: Temp:  [97.9 F (36.6 C)] 97.9 F (36.6 C) (06/16 1313) Pulse Rate:  [45-59] 47 (06/16 1445) Resp:  [18-22] 18 (06/16 1445) BP: (130-194)/(46-64) 194/64 (06/16 1445) SpO2:  [95 %-100 %] 100 % (06/16 1445) Weight:  [71.7 kg (158 lb)] 71.7 kg (158 lb) (06/16 1117) GENERAL: Awake, alert in NAD HEENT: - Normocephalic and atraumatic, dry mm, no LN++, no Thyromegally LUNGS - Clear to auscultation bilaterally with no wheezes CV - S1S2 RRR, no m/r/g, equal pulses bilaterally. ABDOMEN - Soft, nontender, nondistended with normoactive BS Ext: warm, well perfused, intact peripheral pulses, no edema  NEURO:  Mental Status: Awake, alert.  She is globally aphasic. Unable to name.  Unable to comprehend.  Unable to repeat.  Laughs and  smiles at every question that is asked. Cranial Nerves: PERRL.  Left gaze preference unable to cross midline, does not blink to threat from the right, right nasolabial fold flattening,  Motor: Right arm 1/5, right leg 2/5, left arm 5/5, left leg 4/5 Tone: is normal and bulk is normal Sensation-grossly reduced sensation to noxious stim on the right side Coordination: Unable to test Gait- deferred  NIHSS 1a Level of Conscious.: 0 1b LOC Questions: 2 1c LOC Commands: 2 2 Best Gaze: 1 3 Visual: 2 4 Facial Palsy: 1 5a Motor Arm - left: 0 5b Motor Arm - Right: 3 6a Motor Leg -  Left: 1 6b Motor Leg - Right: 3 7 Limb Ataxia: 0 8 Sensory: 2 9 Best Language: 3 10 Dysarthria: 2 11 Extinct. and Inatten.: 2 TOTAL: 24  Labs I have reviewed labs in epic and the results pertinent to this consultation are: CBC    Component Value Date/Time   WBC 11.4 (H) 01/24/2018 1209   RBC 3.16 (L) 01/24/2018 1209   HGB 10.1 (L) 01/24/2018 1209   HGB 14.2 09/03/2017 1509   HCT 31.0 (L) 01/24/2018 1209   HCT 42.9 09/03/2017 1509   PLT 346 01/24/2018 1209   PLT 250 09/03/2017 1509   MCV 98.1 01/24/2018 1209   MCV 95 09/03/2017 1509   MCH 32.0 01/24/2018 1209   MCHC 32.6 01/24/2018 1209   RDW 14.1 01/24/2018 1209   RDW 14.6 09/03/2017 1509   LYMPHSABS 1.0 01/24/2018 1209   MONOABS 1.0 01/24/2018 1209   EOSABS 0.1 01/24/2018 1209   BASOSABS 0.0 01/24/2018 1209   CMP     Component Value Date/Time   NA 140 01/24/2018 1209   NA 142 11/03/2017 1507   K 4.2 01/24/2018 1209   CL 108 01/24/2018 1209   CO2 21 (L) 01/24/2018 1209   GLUCOSE 107 (H) 01/24/2018 1209   BUN 25 (H) 01/24/2018 1209   BUN 23 11/03/2017 1507   CREATININE 1.84 (H) 01/24/2018 1209   CREATININE 1.14 (H) 12/07/2012 1655   CALCIUM 8.9 01/24/2018 1209   PROT 7.0 01/24/2018 1209   PROT 7.1 11/03/2017 1507   ALBUMIN 3.3 (L) 01/24/2018 1209   ALBUMIN 4.3 11/03/2017 1507   AST 32 01/24/2018 1209   ALT 28 01/24/2018 1209    ALKPHOS 61 01/24/2018 1209   BILITOT 0.6 01/24/2018 1209   BILITOT 0.5 11/03/2017 1507   GFRNONAA 25 (L) 01/24/2018 1209   GFRNONAA 48 (L) 12/07/2012 1655   GFRAA 29 (L) 01/24/2018 1209   GFRAA 56 (L) 12/07/2012 1655   Lipid Panel     Component Value Date/Time   CHOL 200 (H) 11/03/2017 1507   TRIG 155 (H) 11/03/2017 1507   TRIG 58 09/19/2014 0938   HDL 63 11/03/2017 1507   HDL 67 09/19/2014 0938   CHOLHDL 3.2 11/03/2017 1507   LDLCALC 106 (H) 11/03/2017 1507   LDLCALC 83 11/21/2013 0830   Imaging I have reviewed the images obtained:  CT-scan of the brain-dense left MCA.  Aspects 4 with early changes of evolving stroke involving the basal ganglia.  CT angios-left M1 occlusion.  Intermediate collaterals.  CT perfusion- 37 cc core, 115 penumbra.  Assessment:  77 year old woman with a recent mitral valve repair, cyst on Coumadin subtherapeutic at 1.7, presenting with acute onset of left MCA syndrome causing right-sided weakness, right facial droop, aphasia, right hemianopsia-last seen normal at 10 PM last night. Not a candidate for TPA due to being outside the window. Was assessed for endovascular intervention by CT angios and CT perfusion study.  Had a poor aspect score on the noncontrast CT of the head but the CT perfusion was favorable, for which she was transferred from any pain hospital to [facility].  Upon discussion with the family at [employer] and upon undergoing risks and benefits, the family decided to not go forward with IR intervention for the risk of hemorrhage. 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 Blood pressures can be gradually normalized to SBP<140 upon discharge. -MRI brain  without contrast -Echocardiogram -Might need cardiology consultation for the recent mitral valve repair and anticoagulation.  From a neurological standpoint, she has a large size stroke and anticoagulation unless absolutely necessary should be avoided for the next 48 to 72 hours the risk of hemorrhagic transformation. -HgbA1c, fasting lipid panel -Frequent neuro checks -Prophylactic therapy-Antiplatelet med: Aspirin - dose 325mg  PO -Atorvastatin 80 mg PO daily -Risk factor modification -I discussed the importance of exercise as well as smoking/alcohol/illicit drug use cessation. -PT consult, OT consult, Speech consult -Repeat head CT at 11pm to assess for any evidence of cerebral edema and midline shift. In either case, patient will not be a candidate for decompressive hemicraniectomy given the age and comorbidities.  -Consider hypertonic saline if repeat CT scan shows evidence of mass-effect or herniation  Had a detailed discussion with the patient's son and daughter, alongside Dr. Estanislado Pandy. We answered all their questions.  Please page stroke NP/PA/MD (listed on AMION)  from 8am-4 pm as this patient will be followed by the stroke team at this point.  -- Amie Portland, MD [facility] Pager: [phone] If 7pm to 7am, please call on call as listed on AMION.   CRITICAL CARE ATTESTATION This patient is critically ill and at significant risk of neurological worsening, death and care requires constant monitoring of vital signs, hemodynamics,respiratory and cardiac monitoring. I spent 60  minutes of neurocritical care time performing neurological assessment, discussion with family, other specialists and medical decision making of high complexityin the care of  this patient.

## 2018-01-24 NOTE — ED Notes (Addendum)
EDP at bedside. At this time EDP is not calling a code stroke

## 2018-01-24 NOTE — ED Notes (Signed)
Pt arrived at CT

## 2018-01-24 NOTE — ED Notes (Signed)
Pt returned to room CT perfusion

## 2018-01-24 NOTE — ED Triage Notes (Signed)
Pt in the bed at 10pm last night. Per family normal at that time. Normally ambulatory and bverbal. Found in floor approx 10am. Pt laying prone. Noted to have hematoma to Right eye. Non verbal at this time.

## 2018-01-24 NOTE — H&P (Signed)
History and Physical    Brooke Chavez ESP:[id number] DOB: Apr 14, 1941 DOA: 01/24/2018   PCP: [name], FNP   Patient coming from:  Home    Chief Complaint: right sided weakness   HPI: Brooke Chavez is a 77 y.o. female with medical history significant history of mitral valve disease, status post mitral valve repair 01/07/2018, discharged on Coumadin on 01/14/2018, which appeared to be subtherapeutic today at 1.7, hypertension, hyperlipidemia, last seen normal around 10 PM last night, presented to [facility] with aphasia, noted to have right-sided weakness, right hemianopsia, right facial droop.  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  She was referred to [facility] for possible endovascular intervention of the left M1 occlusion.  Family wanted to discuss with brother who was out of state regarding this plans for the procedure   When she arrived to [facility], she was taken directly to the IR suite.  Interventional Radiologist  and the EDP spoke with the family members, explained the findings and her clinical pictures, and after detailed discussion with risk and benefits of it, and the family decided not to pursue IR procedure given the risk of bleeding.  She did not receive TPA, as she is outside of the window of presentation.  Rankin scale is 1-2. Other history is obtain by her daughter, who states that the patient was "just fine until the night before and had even taken her meds at 9 pm without any difficulty, no apparent swallowing issues", or noticing that the patient had any ambulation abnormalities.  Daughter denies the patient having any chest pain, palpitations, any recent infections, sick contacts, traveling long distance, she reports that the patient was compliant with all her meds.  "She was in a good mood before this happened "patient does not smoke,  drink alcohol, or partake recreational drugs.  She never had a stroke before. Patient is level 5 caveat, due to inability to provide answers due  to above therefore other information cannot be obtained at this time..   ED Course:  BP (!) 170/55   Pulse (!) 54   Temp 97.9 F (36.6 C) (Rectal)   Resp (!) 25   Wt 71.7 kg (158 lb)   LMP 11/05/1992   SpO2 100%   BMI 27.99 kg/m   PT 20, INR 1.79 white count 11.4, hemoglobin 10.1, bicarb 21, glucose 107, BUN 25, creatinine 1.84, albumin 3.3, Troponin is 0.4 Platelets 346 Urinalysis negative UDS negative Alcohol negative EKG shows A. fib, with premature ventricular complexes, left bundle branch block Radiological studies show CT Angie of the head and neck on 01/24/2018 remarkable for proximal left M1 occlusion with thrombus extending to the terminus with intermediate left MCA distribution collateralization. CT of the head without contrast shows Large territory acute left MCA infarct without hemorrhage. Hyperdense left internal carotid artery and left MCA compatible with acute thrombosis Neurology is following, Dr. Rory Percy.   Review of Systems:  As per HPI otherwise all other systems reviewed and are negative  Past Medical History:  Diagnosis Date  . Atrial flutter ([place])    Typical by EKG diagnosis 9/11 s/p CIT ablation 11/11  . Bradycardia   . Chronic diastolic congestive heart failure ([place])   . DJD (degenerative joint disease)   . Dysrhythmia   . Femur fracture, left ([place]) 2013  . Heart murmur   .  Hyperlipidemia    x5 years  . Hypertension    Since 1997  . Liver masses 11/13/2017   Multiple small nodules seen on CT and MRI  . Mitral regurgitation    severe  . 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 TR (tricuspid regurgitation)     Mild with RA enlargment    Past Surgical History:  Procedure Laterality Date  . A FLUTTER ABLATION N/A 06/27/2011   Procedure: ABLATION A FLUTTER;  Surgeon: Thompson Grayer, MD;  Location: [facility] CATH LAB;  Service: Cardiovascular;  Laterality: N/A;  . ATRIAL ABLATION SURGERY  06/2010  . HIP SURGERY     Left (fracture) 3/13  . JOINT REPLACEMENT     both knees   . KNEE ARTHROSCOPY     Right  . LUMBAR SPINE SURGERY    . MITRAL VALVE REPAIR Right 01/07/2018   Procedure: MINIMALLY INVASIVE MITRAL VALVE REPAIR using LivaNova ring size 28 MM;  Surgeon: Rexene Alberts, MD;  Location: [place];  Service: Open Heart Surgery;  Laterality: Right;  . PATENT FORAMEN OVALE(PFO) CLOSURE N/A 01/07/2018   Procedure: PATENT FORAMEN OVALE (PFO) CLOSURE;  Surgeon: Rexene Alberts, MD;  Location: [place];  Service: Open Heart Surgery;  Laterality: N/A;  . RIGHT/LEFT HEART CATH AND CORONARY ANGIOGRAPHY N/A 11/11/2017   Procedure: RIGHT/LEFT HEART CATH AND CORONARY ANGIOGRAPHY;  Surgeon: Sherren Mocha, MD;  Location: [place] CV LAB;  Service: Cardiovascular;  Laterality: N/A;  . TEE WITHOUT CARDIOVERSION N/A 09/18/2017   Procedure: TRANSESOPHAGEAL ECHOCARDIOGRAM (TEE);  Surgeon: Skeet Latch, MD;  Location: [place];  Service: Cardiovascular;  Laterality: N/A;  . TEE WITHOUT CARDIOVERSION N/A 01/07/2018   Procedure: TRANSESOPHAGEAL ECHOCARDIOGRAM (TEE);  Surgeon: Rexene Alberts, MD;  Location: [place];  Service: Open Heart Surgery;  Laterality: N/A;  . TOTAL KNEE ARTHROPLASTY     Left  . TUBAL LIGATION      Social History Social History   Socioeconomic History  . Marital status: Widowed    Spouse name: Not on file  . Number of children: Not on file  . Years of education: Not on file  . Highest education level: Not on file  Occupational History  . Not on file  Social Needs  . Financial resource strain: Not on file  . Food insecurity:    Worry: Not on file    Inability: Not on file  .  Transportation needs:    Medical: Not on file    Non-medical: Not on file  Tobacco Use  . Smoking status: Never Smoker  . Smokeless tobacco: Never Used  Substance and Sexual Activity  . Alcohol use: No  . Drug use: No  . Sexual activity: Never  Lifestyle  . Physical activity:    Days per week: Not on file    Minutes per session: Not on file  . Stress: Not on file  Relationships  . Social connections:    Talks on phone: Not on file    Gets together: Not on file    Attends religious service: Not on file    Active member of club or organization: Not on file    Attends meetings of clubs or organizations: Not on file    Relationship status: Not on file  . Intimate partner violence:    Fear of current or  ex partner: Not on file    Emotionally abused: Not on file    Physically abused: Not on file    Forced sexual activity: Not on file  Other Topics Concern  . Not on file  Social History Narrative   Lives in [place]   Her daughter lives with her     Allergies  Allergen Reactions  . Oxycodone-Acetaminophen Anxiety and Other (See Comments)    Hallucinations  . Aspirin Nausea And Vomiting    Family History  Problem Relation Age of Onset  . Hypertension Mother   . Cancer Sister        leukemia  . Diabetes Brother   . Stroke Sister   . Diabetes Brother   . Heart attack Brother   . Healthy Daughter   . Healthy Son   . Healthy Daughter        Prior to Admission medications   Medication Sig Start Date End Date Taking? Authorizing Provider  acetaminophen (TYLENOL) 500 MG tablet Take 1,000 mg by mouth every 6 (six) hours as needed for moderate pain or headache.   Yes [provider]  amiodarone (PACERONE) 200 MG tablet Take 1 tablet (200 mg total) by mouth daily. 01/14/18  Yes Lars Pinks M, PA-C  atorvastatin (LIPITOR) 40 MG tablet Take 1 tablet (40 mg total) by mouth at bedtime. 11/03/17  Yes Hassell Done, Mary-Margaret, FNP  ferrous sulfate 325 (65 FE) MG  tablet Take 1 tablet (325 mg total) by mouth daily with breakfast. For one month then stop. 01/14/18 01/14/19 Yes Tacy Dura, Donielle M, PA-C  hydrochlorothiazide (HYDRODIURIL) 25 MG tablet Take 1 tablet (25 mg total) by mouth daily. 01/14/18  Yes Lars Pinks M, PA-C  warfarin (COUMADIN) 2.5 MG tablet Take 1 tablet (2.5 mg total) by mouth daily at 6 PM. Or as directed 01/14/18  Yes Lars Pinks M, PA-C  famotidine (PEPCID) 20 MG tablet Take 1 tablet (20 mg total) by mouth 2 (two) times daily. 07/29/11 10/29/11  Richardson Dopp T, PA-C     Physical Exam:  Vitals:   01/24/18 1515 01/24/18 1517 01/24/18 1530 01/24/18 1545  BP: (!) 162/49  (!) 168/63 (!) 170/55  Pulse: (!) 45 (!) 41 (!) 52 (!) 54  Resp: 18 13 20  (!) 25  Temp:      TempSrc:      SpO2: 99% 98% 97% 100%  Weight:       Constitutional: NAD, calm, globally aphasic, unable but appears to be wheezing to engage in conversation, unclear of level of comprehension Eyes: PERRL, lids and conjunctivae normal.  Patient favors the left side, left gaze ENMT: Limited exam, as the patient cannot open her mouth on the right due to right facial droop  Neck: normal, supple, no masses, no thyromegaly Respiratory: Essentially cLear to auscultation bilaterally, no wheezing, no crackles, trace of rhonchi. Normal respiratory effort  Cardiovascular: Bradycardic rate and rhythm, 2  out of 6 harsh murmur, rubs or gallops. No extremity edema. 2+ pedal pulses. No carotid bruits.  Abdomen: Soft, no apparent tenderness, No hepatosplenomegaly. Bowel sounds positive.  Musculoskeletal: no clubbing / cyanosis.  Left side can move properly, but right side with severe weakness.  No deformities noted Skin: no jaundice, No lesions.  Well-healed scar at the mitral valve repair site Neurologic:  Right sided severe weakness, 1-2/5 left side appears normal, appears to have decreased sensation throughout, cannot follow commands, but seems to be trying.  Family believes  that the patient is "doing better since presentation"  Labs on Admission: I have personally reviewed following labs and imaging studies  CBC: Recent Labs  Lab 01/24/18 1207 01/24/18 1209  WBC  --  11.4*  NEUTROABS  --  9.3*  HGB 10.2* 10.1*  HCT 30.0* 31.0*  MCV  --  98.1  PLT  --  297    Basic Metabolic Panel: Recent Labs  Lab 01/24/18 1207 01/24/18 1209  NA 139 140  K 4.4 4.2  CL 108 108  CO2  --  21*  GLUCOSE 104* 107*  BUN 23* 25*  CREATININE 1.70* 1.84*  CALCIUM  --  8.9    GFR: Estimated Creatinine Clearance: 24.3 mL/min (A) (by C-G formula based on SCr of 1.84 mg/dL (H)).  Liver Function Tests: Recent Labs  Lab 01/24/18 1209  AST 32  ALT 28  ALKPHOS 61  BILITOT 0.6  PROT 7.0  ALBUMIN 3.3*   No results for input(s): LIPASE, AMYLASE in the last 168 hours. No results for input(s): AMMONIA in the last 168 hours.  Coagulation Profile: Recent Labs  Lab 01/18/18 1050 01/24/18 1209  INR 1.5* 1.79    Cardiac Enzymes: No results for input(s): CKTOTAL, CKMB, CKMBINDEX, TROPONINI in the last 168 hours.  BNP (last 3 results) No results for input(s): PROBNP in the last 8760 hours.  HbA1C: No results for input(s): HGBA1C in the last 72 hours.  CBG: No results for input(s): GLUCAP in the last 168 hours.  Lipid Profile: No results for input(s): CHOL, HDL, LDLCALC, TRIG, CHOLHDL, LDLDIRECT in the last 72 hours.  Thyroid Function Tests: No results for input(s): TSH, T4TOTAL, FREET4, T3FREE, THYROIDAB in the last 72 hours.  Anemia Panel: No results for input(s): VITAMINB12, FOLATE, FERRITIN, TIBC, IRON, RETICCTPCT in the last 72 hours.  Urine analysis:    Component Value Date/Time   COLORURINE YELLOW 01/24/2018 1327   APPEARANCEUR CLEAR 01/24/2018 1327   LABSPEC 1.032 (H) 01/24/2018 1327   PHURINE 5.0 01/24/2018 1327   GLUCOSEU NEGATIVE 01/24/2018 1327   HGBUR NEGATIVE 01/24/2018 1327   BILIRUBINUR NEGATIVE 01/24/2018 1327   BILIRUBINUR  negative 09/19/2014 0938   KETONESUR NEGATIVE 01/24/2018 1327   PROTEINUR NEGATIVE 01/24/2018 1327   UROBILINOGEN negative 09/19/2014 0938   UROBILINOGEN 1.0 05/09/2010 1224   NITRITE NEGATIVE 01/24/2018 1327   LEUKOCYTESUR NEGATIVE 01/24/2018 1327    Sepsis Labs: @LABRCNTIP (procalcitonin:4,lacticidven:4) )No results found for this or any previous visit (from the past 240 hour(s)).   Radiological Exams on Admission: Ct Angio Head W Or Wo Contrast  Result Date: 01/24/2018 CLINICAL DATA:  77 y/o  F; acute stroke for follow-up. EXAM: CT ANGIOGRAPHY HEAD AND NECK CT PERFUSION BRAIN TECHNIQUE: Multidetector CT imaging of the head and neck was performed using the standard protocol during bolus administration of intravenous contrast. Multiplanar CT image reconstructions and MIPs were obtained to evaluate the vascular anatomy. Carotid stenosis measurements (when applicable) are obtained utilizing NASCET criteria, using the distal internal carotid diameter as the denominator. Multiphase CT imaging of the brain was performed following IV bolus contrast injection. Subsequent parametric perfusion maps were calculated using RAPID software. CONTRAST:  196mL ISOVUE-370 IOPAMIDOL (ISOVUE-370) INJECTION 76% COMPARISON:  01/24/2018 CT head. FINDINGS: CTA NECK FINDINGS Aortic arch: Bovine variant branching. Imaged portion shows no evidence of aneurysm or dissection. No significant stenosis of the major arch vessel origins. Mild calcific atherosclerosis. Right carotid system: No evidence of dissection, stenosis (50% or greater) or occlusion. Left carotid system: No evidence of dissection, stenosis (50% or greater) or occlusion. Vertebral arteries: Codominant.  No evidence of dissection, stenosis (50% or greater) or occlusion. Skeleton: Moderate cervical spondylosis with multilevel disc and facet degenerative changes greatest at the C4-C7 levels. No high-grade bony canal stenosis. Other neck: Negative. Upper chest: Small  right pleural effusion. Review of the MIP images confirms the above findings CTA HEAD FINDINGS Anterior circulation: Proximal left M1 occlusion with thrombus extending into the terminus with intermediate left MCA distribution collateralization. The thrombus appears approximately 8 mm in length (series 9, image 89). Mild calcific atherosclerosis of carotid siphons. Patent right MCA and bilateral ACA distributions with no additional large vessel occlusion, aneurysm, or significant stenosis. Posterior circulation: No significant stenosis, proximal occlusion, aneurysm, or vascular malformation. Venous sinuses: As permitted by contrast timing, patent. Anatomic variants: Right fetal PCA.  Complete circle-of-Willis. Review of the MIP images confirms the above findings CT Brain Perfusion Findings: CBF (<30%) Volume: 90mL Perfusion (Tmax>6.0s) volume: 171mL Mismatch Volume: 73mL Infarction Location:Left MCA distribution. IMPRESSION: 1. Left proximal M1 occlusion with thrombus measuring approximately 8 mm in length. Intermediate collaterals. 2. Left MCA distribution infarction with perfusion CT calculated core of 37 cc corresponding to the infarct on noncontrast CT. The calculated core may slightly underestimate the actual core as the region of infarct visible on noncontrast CT (ASPECTS 4) may have areas of pseudonormalization. 3. Large perfusion penumbra, 78 cc. 4. Patent carotid and vertebral arteries in the neck. No significant stenosis by NASCET criteria, dissection, or aneurysm. 5. Patent right MCA, bilateral ACA, and bilateral PCA distributions. These results were called by telephone at the time of interpretation on 01/24/2018 at 1:12 pm to Dr. Meryl Crutch , who verbally acknowledged these results. Electronically Signed   By: Kristine Garbe M.D.   On: 01/24/2018 13:25   Ct Head Wo Contrast  Result Date: 01/24/2018 CLINICAL DATA:  Altered level of consciousness. History of neuroendocrine tumor EXAM: CT HEAD  WITHOUT CONTRAST TECHNIQUE: Contiguous axial images were obtained from the base of the skull through the vertex without intravenous contrast. COMPARISON:  None. FINDINGS: Brain: Ill-defined hypodensity left MCA territory compatible with acute infarct. This involves the frontal and temporal lobe as well as some of the left parietal lobe. There is infarct involving the insula and basal ganglia. No associated hemorrhage. No midline shift. Ventricle size normal. Chronic infarct left cerebellum. Chronic microvascular ischemic change in the white matter. Vascular: Hyperdense distal left internal carotid artery and left MCA compatible with acute thrombosis. Skull: Negative Sinuses/Orbits: Paranasal sinuses clear.  Bilateral cataract surgery Other: None ASPECTS ([place] Stroke Program Early CT Score) - Ganglionic level infarction (caudate, lentiform nuclei, internal capsule, insula, M1-M3 cortex): 2 - Supraganglionic infarction (M4-M6 cortex): 2 Total score (0-10 with 10 being normal): 4 IMPRESSION: 1. Large territory acute left MCA infarct without hemorrhage. Hyperdense left internal carotid artery and left MCA compatible with acute thrombosis. 2. ASPECTS is 4 3. These results were called by telephone at the time of interpretation on 01/24/2018 at 11:50 am to Dr. Virgel Manifold , who verbally acknowledged these results. Electronically Signed   By: Franchot Gallo M.D.   On: 01/24/2018 11:50   Ct Angio Neck W Or Wo Contrast  Result Date: 01/24/2018 CLINICAL DATA:  77 y/o  F; acute stroke for follow-up. EXAM: CT ANGIOGRAPHY HEAD AND NECK CT PERFUSION BRAIN TECHNIQUE: Multidetector CT imaging of the head and neck was performed using the standard protocol during bolus administration of intravenous contrast. Multiplanar CT image reconstructions and MIPs were obtained to evaluate the vascular anatomy. Carotid stenosis measurements (when applicable)  are obtained utilizing NASCET criteria, using the distal internal carotid  diameter as the denominator. Multiphase CT imaging of the brain was performed following IV bolus contrast injection. Subsequent parametric perfusion maps were calculated using RAPID software. CONTRAST:  120mL ISOVUE-370 IOPAMIDOL (ISOVUE-370) INJECTION 76% COMPARISON:  01/24/2018 CT head. FINDINGS: CTA NECK FINDINGS Aortic arch: Bovine variant branching. Imaged portion shows no evidence of aneurysm or dissection. No significant stenosis of the major arch vessel origins. Mild calcific atherosclerosis. Right carotid system: No evidence of dissection, stenosis (50% or greater) or occlusion. Left carotid system: No evidence of dissection, stenosis (50% or greater) or occlusion. Vertebral arteries: Codominant. No evidence of dissection, stenosis (50% or greater) or occlusion. Skeleton: Moderate cervical spondylosis with multilevel disc and facet degenerative changes greatest at the C4-C7 levels. No high-grade bony canal stenosis. Other neck: Negative. Upper chest: Small right pleural effusion. Review of the MIP images confirms the above findings CTA HEAD FINDINGS Anterior circulation: Proximal left M1 occlusion with thrombus extending into the terminus with intermediate left MCA distribution collateralization. The thrombus appears approximately 8 mm in length (series 9, image 89). Mild calcific atherosclerosis of carotid siphons. Patent right MCA and bilateral ACA distributions with no additional large vessel occlusion, aneurysm, or significant stenosis. Posterior circulation: No significant stenosis, proximal occlusion, aneurysm, or vascular malformation. Venous sinuses: As permitted by contrast timing, patent. Anatomic variants: Right fetal PCA.  Complete circle-of-Willis. Review of the MIP images confirms the above findings CT Brain Perfusion Findings: CBF (<30%) Volume: 70mL Perfusion (Tmax>6.0s) volume: 164mL Mismatch Volume: 17mL Infarction Location:Left MCA distribution. IMPRESSION: 1. Left proximal M1 occlusion  with thrombus measuring approximately 8 mm in length. Intermediate collaterals. 2. Left MCA distribution infarction with perfusion CT calculated core of 37 cc corresponding to the infarct on noncontrast CT. The calculated core may slightly underestimate the actual core as the region of infarct visible on noncontrast CT (ASPECTS 4) may have areas of pseudonormalization. 3. Large perfusion penumbra, 78 cc. 4. Patent carotid and vertebral arteries in the neck. No significant stenosis by NASCET criteria, dissection, or aneurysm. 5. Patent right MCA, bilateral ACA, and bilateral PCA distributions. These results were called by telephone at the time of interpretation on 01/24/2018 at 1:12 pm to Dr. Meryl Crutch , who verbally acknowledged these results. Electronically Signed   By: Kristine Garbe M.D.   On: 01/24/2018 13:25   Ct Cerebral Perfusion W Contrast  Result Date: 01/24/2018 CLINICAL DATA:  77 y/o  F; acute stroke for follow-up. EXAM: CT ANGIOGRAPHY HEAD AND NECK CT PERFUSION BRAIN TECHNIQUE: Multidetector CT imaging of the head and neck was performed using the standard protocol during bolus administration of intravenous contrast. Multiplanar CT image reconstructions and MIPs were obtained to evaluate the vascular anatomy. Carotid stenosis measurements (when applicable) are obtained utilizing NASCET criteria, using the distal internal carotid diameter as the denominator. Multiphase CT imaging of the brain was performed following IV bolus contrast injection. Subsequent parametric perfusion maps were calculated using RAPID software. CONTRAST:  123mL ISOVUE-370 IOPAMIDOL (ISOVUE-370) INJECTION 76% COMPARISON:  01/24/2018 CT head. FINDINGS: CTA NECK FINDINGS Aortic arch: Bovine variant branching. Imaged portion shows no evidence of aneurysm or dissection. No significant stenosis of the major arch vessel origins. Mild calcific atherosclerosis. Right carotid system: No evidence of dissection, stenosis (50% or  greater) or occlusion. Left carotid system: No evidence of dissection, stenosis (50% or greater) or occlusion. Vertebral arteries: Codominant. No evidence of dissection, stenosis (50% or greater) or occlusion. Skeleton: Moderate cervical spondylosis with multilevel  disc and facet degenerative changes greatest at the C4-C7 levels. No high-grade bony canal stenosis. Other neck: Negative. Upper chest: Small right pleural effusion. Review of the MIP images confirms the above findings CTA HEAD FINDINGS Anterior circulation: Proximal left M1 occlusion with thrombus extending into the terminus with intermediate left MCA distribution collateralization. The thrombus appears approximately 8 mm in length (series 9, image 89). Mild calcific atherosclerosis of carotid siphons. Patent right MCA and bilateral ACA distributions with no additional large vessel occlusion, aneurysm, or significant stenosis. Posterior circulation: No significant stenosis, proximal occlusion, aneurysm, or vascular malformation. Venous sinuses: As permitted by contrast timing, patent. Anatomic variants: Right fetal PCA.  Complete circle-of-Willis. Review of the MIP images confirms the above findings CT Brain Perfusion Findings: CBF (<30%) Volume: 35mL Perfusion (Tmax>6.0s) volume: 140mL Mismatch Volume: 51mL Infarction Location:Left MCA distribution. IMPRESSION: 1. Left proximal M1 occlusion with thrombus measuring approximately 8 mm in length. Intermediate collaterals. 2. Left MCA distribution infarction with perfusion CT calculated core of 37 cc corresponding to the infarct on noncontrast CT. The calculated core may slightly underestimate the actual core as the region of infarct visible on noncontrast CT (ASPECTS 4) may have areas of pseudonormalization. 3. Large perfusion penumbra, 78 cc. 4. Patent carotid and vertebral arteries in the neck. No significant stenosis by NASCET criteria, dissection, or aneurysm. 5. Patent right MCA, bilateral ACA, and  bilateral PCA distributions. These results were called by telephone at the time of interpretation on 01/24/2018 at 1:12 pm to Dr. Meryl Crutch , who verbally acknowledged these results. Electronically Signed   By: Kristine Garbe M.D.   On: 01/24/2018 13:25    EKG: Independently reviewed.  Assessment/Plan Active Problems:   Hyperlipidemia with target LDL less than 100   MITRAL REGURGITATION   Essential hypertension   Atrial flutter ([facility])   GERD (gastroesophageal reflux disease)   Pancreatic mass   Liver masses   Chronic diastolic congestive heart failure ([facility])   S/P minimally invasive mitral valve repair   Acute ischemic left MCA stroke ([place])   Acute is ischemic left MCA stroke in the setting of status post mitral valve repair 01/07/2018, discharged on Coumadin on 01/14/2018, which appeared to be subtherapeutic today at 1.7, noted to have right-sided weakness, right hemianopsia, right facial droop.  At 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  She was referred to [facility] for possible endovascular intervention of the left M1 occlusion.  Interventional Radiologist  and the EDP spoke with the family members, explained the findings and her clinical pictures, and after detailed discussion with risk and benefits of it, and the family decided not to pursue IR procedure given the risk of bleeding.  She did not receive TPA, as she is outside of the window of presentation.  Rankin scale is 1-2    Admit to Tele / Inpatient Stroke order set  MRI brain without contrast  Repeat CT Head without contrast at 23 pm to assess cerebral edema and midline shift , although she will not be a candidate for decompressive craniectomy given age and comorbidities . Order written by Neuro  Echo  Allow permissive HTN for the first 24 to in 48 hours, only treat as needed if the blood pressure is  greater than 220 and can gradually be normalized to systolic blood pressures less than 140 upon discharge NPO  As she did  not pass the bedswallow test  PT/OT/SLP  lipid panel A1C Frequent Neuro checks  Aspirin 300 PR equivalent as she did not pass the swallow test  Lipitor 80 mg if  able to tolerate po otherwise will not be able to provide med  Neuro  Following, appreciate their involvement  Will consider Care Manager and Social Work consult, likely to need inpatient rehab versus SNF   Neuro reports the patient "Might need cardiology consultation for the recent mitral valve repair and anticoagulation.  From a neurological standpoint, she has a large size stroke and anticoagulation unless absolutely necessary should be avoided for the next 48 to 72 hours the risk of hemorrhagic transformation." Left message to [place]      History of recent MVR, admitted on 01/07/2018, was discharged on 6 01/17/2017, undergoing surgery on 01/07/2018 by Dr. Verita Schneiders, due to severe mitral regurgitation, patent for patent foramen ovale Patient was discharged on Coumadin, last INR was 1.7, subtherapeutic.  This was last taken yesterday evening prior to this hospitalization. Any anticoagulation was placed on hold right now 2 D echo  Patient was on Amiodarone (Aflutter and recent surgery), last dose was yesterday, will need to evaluate resuming it pending on patient's status, may need Cards eval tomorrow   Hypertension BP 162/54   Pulse 47    Hold  home anti-hypertensive medications to allow permissive hypertension  Add Hydralazine Q6 hours as needed for BP 220/110  Hyperlipidemia  meds on hold  Check lipid panel    GERD, no acute symptoms PPI IV   Diastolic CHF: No acute decompensation weight 158 last TEE on 01/07/2018 shows LV function normal EF 55 to 60%.  At that time, no thrombus was present or masses.  However, a new echo in order. Oral medicines are on hold Obtain daily weights Monitor  intake and output  Anemia of chronic disease Hemoglobin on admission 10.9, no acute issues at this time   Repeat CBC in am  No transfusion is indicated at this time    DVT prophylaxis: SCD Code Status:    Discussed at length with the family, and at this time, she continues to be full code.  The family wants to discuss with the other members, and this may change throughout his hospitalization depending on her overall status and progress Family Communication:  Discussed with patient Disposition Plan: To be determined, pending the patient's progress. Consults called:    Neurology, Dr. Rory Percy Admission status: Inpatient telemetry   Sharene Butters, [place] [facility]   Amion text  [phone]   01/24/2018, 4:09 PM

## 2018-01-24 NOTE — ED Notes (Signed)
Pt difficult IV stick. 18 g placed in right ac for test

## 2018-01-24 NOTE — ED Notes (Signed)
Attempted to call report; nurse to call me back. 

## 2018-01-24 NOTE — Consult Note (Addendum)
TeleNeurology Consult Note  [place]     Consulting Physician: Clabe Seal Code Status: Prior Primary Care Physician:  [name], FNP  DOB:  March 10, 1941   Age: 77 y.o.  Date of Service: January 24, 2018 Admit Date:  01/24/2018   Admitting Diagnosis: <principal problem not specified>  Subjective:  Reason for Consultation: stroke  Chief Complaint: right hemiparesis and aphasia  History of present illness: 77 y/o woman presents with right hemiparesis and aphasia. Last known well at 2200 as per family at bedside. CT head reviewed and case discussed with ED staff.   Review of Systems  Patient Active Problem List   Diagnosis Date Noted  . Encounter for therapeutic drug monitoring 01/18/2018  . 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 Chronic diastolic congestive heart failure ([place])   . 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 Liver masses 11/13/2017  . Non-rheumatic mitral regurgitation   . BMI 30.0-30.9,adult 07/24/2015  . Osteoporosis 09/19/2014  . GERD (gastroesophageal reflux disease) 07/29/2011  . Atrial flutter ([place]) 05/29/2010  . MITRAL REGURGITATION 01/09/2009  . Hyperlipidemia with target LDL less than 100 01/04/2009  . Essential hypertension 01/04/2009   Past Medical History:  Diagnosis Date  . Atrial flutter ([place])    Typical by EKG diagnosis 9/11 s/p CIT ablation 11/11  . Bradycardia   . Chronic diastolic congestive heart failure ([place])   . DJD (degenerative joint disease)   . Dysrhythmia   . Femur fracture, left ([place]) 2013  . Heart murmur   . Hyperlipidemia    x5 years  . Hypertension    Since 1997  . Liver masses 11/13/2017   Multiple small nodules seen on CT and MRI  . Mitral regurgitation    severe  . 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 TR (tricuspid regurgitation)    Mild with RA enlargment   Past Surgical History:  Procedure Laterality Date  . A FLUTTER ABLATION N/A 06/27/2011   Procedure: ABLATION A FLUTTER;  Surgeon: Thompson Grayer, MD;  Location: [facility] CATH LAB;  Service: Cardiovascular;  Laterality: N/A;  . ATRIAL ABLATION SURGERY  06/2010  . HIP SURGERY     Left (fracture) 3/13  . JOINT REPLACEMENT     both knees   . KNEE ARTHROSCOPY     Right  . LUMBAR SPINE SURGERY    . MITRAL VALVE REPAIR Right 01/07/2018   Procedure: MINIMALLY INVASIVE MITRAL VALVE REPAIR using LivaNova ring size 28 MM;  Surgeon: Rexene Alberts, MD;  Location: [place];  Service: Open Heart Surgery;  Laterality: Right;  . PATENT FORAMEN OVALE(PFO) CLOSURE N/A 01/07/2018   Procedure: PATENT FORAMEN OVALE (PFO) CLOSURE;  Surgeon: Rexene Alberts, MD;  Location: [place];  Service: Open Heart Surgery;  Laterality: N/A;  . RIGHT/LEFT HEART CATH AND CORONARY ANGIOGRAPHY N/A 11/11/2017   Procedure: RIGHT/LEFT HEART CATH AND CORONARY ANGIOGRAPHY;  Surgeon: Sherren Mocha, MD;  Location: [place] CV LAB;  Service: Cardiovascular;  Laterality: N/A;  . TEE WITHOUT CARDIOVERSION N/A 09/18/2017   Procedure: TRANSESOPHAGEAL ECHOCARDIOGRAM (TEE);  Surgeon: Skeet Latch, MD;  Location: [place];  Service: Cardiovascular;  Laterality: N/A;  . TEE WITHOUT CARDIOVERSION N/A 01/07/2018   Procedure: TRANSESOPHAGEAL ECHOCARDIOGRAM (TEE);  Surgeon: Rexene Alberts, MD;  Location: [place];  Service: Open Heart Surgery;  Laterality:  N/A;  . TOTAL KNEE ARTHROPLASTY     Left  . TUBAL LIGATION     Allergies  Allergen Reactions  . Oxycodone-Acetaminophen Anxiety and Other (See Comments)    Hallucinations  . Aspirin Nausea And Vomiting   . aspirin  300 mg Rectal Once   Social History   Socioeconomic History  . Marital status: Widowed    Spouse name: Not on file  . Number of children: Not on file  . Years of education: Not on file  . Highest education  level: Not on file  Occupational History  . Not on file  Social Needs  . Financial resource strain: Not on file  . Food insecurity:    Worry: Not on file    Inability: Not on file  . Transportation needs:    Medical: Not on file    Non-medical: Not on file  Tobacco Use  . Smoking status: Never Smoker  . Smokeless tobacco: Never Used  Substance and Sexual Activity  . Alcohol use: No  . Drug use: No  . Sexual activity: Never  Lifestyle  . Physical activity:    Days per week: Not on file    Minutes per session: Not on file  . Stress: Not on file  Relationships  . Social connections:    Talks on phone: Not on file    Gets together: Not on file    Attends religious service: Not on file    Active member of club or organization: Not on file    Attends meetings of clubs or organizations: Not on file    Relationship status: Not on file  . Intimate partner violence:    Fear of current or ex partner: Not on file    Emotionally abused: Not on file    Physically abused: Not on file    Forced sexual activity: Not on file  Other Topics Concern  . Not on file  Social History Narrative   Lives in [place]   Her daughter lives with her   Family History Family History  Problem Relation Age of Onset  . Hypertension Mother   . Cancer Sister        leukemia  . Diabetes Brother   . Stroke Sister   . Diabetes Brother   . Heart attack Brother   . Healthy Daughter   . Healthy Son   . Healthy Daughter       Objective:  Vital signs in last 24 hours: Pulse Rate:  [47] 47 (06/16 1122) Resp:  [20] 20 (06/16 1122) BP: (130)/(57) 130/57 (06/16 1122) SpO2:  [100 %] 100 % (06/16 1122) Weight:  [71.7 kg (158 lb)] 71.7 kg (158 lb) (06/16 1117) No data recorded.    Intake/Output last 3 shifts: No intake/output data recorded.  Intake/Output this shift: No intake/output data recorded.  Physical Exam 1A: Level of Consciousness - Alert; keenly responsive 0 1B: Ask Month and Age -  Aphasic +2 1C: 'Blink Eyes' & 'Squeeze Hands' - Performs 0 Tasks +2 2: Test Horizontal Extraocular Movements - Forced Gaze Palsy: Cannot Be Overcome +2 3: Test Visual Fields - No Visual Loss 0 4: Test Facial Palsy - Minor paralysis (flat nasolabial fold, smile asymetry) +1 5A: Test Left Arm Motor Drift - No Drift for 10 Seconds 0 5B: Test Right Arm Motor Drift - No Movement +4 6A: Test Left Leg Motor Drift - Drift, hits bed +2 6B: Test Right Leg Motor Drift - No Movement +4 7: Test Limb Ataxia -  Does Not Understand 0 8: Test Sensation - No Response and Quadriplegic +2 9: Test Language/Aphasia - ute/Global Aphasia: No Usable Speech/Auditory Comprehensionute/Global Aphasia: No Usable Speech/Auditory Comprehension +3 10: Test Dysarthria - Mute/Anarthric +2 11: Test Extinction/Inattention - Visual/tactile/auditory/spatial/personal inattention +1 NIHSS 25  Diagnostic Findings:  Pertinent Labs: No results for input(s): WBC, MCV in the last 168 hours.  Invalid input(s): HEMATOCRIT, HEMOGLOBIN, PLATELETS No results for input(s): POTASSIUM, CHLORIDE, CALCIUM, BUN, GLUCOSE, CREATININE, CO2 in the last 168 hours.  Invalid input(s): SODIUM, ANION GAP2, GFR MDRD AF AMER No results for input(s): AST, ALT, ALBUMIN in the last 168 hours.  Invalid input(s): ALK PHOS, BILIRUBIN TOTAL, BILIRUBIN DIRECT, PROTEIN TOTAL, GLOBULIN No results for input(s): TRIG in the last 168 hours.  Invalid input(s): CHLPL, HDL PML, VLDL CALC, LDL CALC, CHOL/HDL RATIO, LDL/HDL RATIO, NON-HDL CHOLESTEROL Invalid input(s): CK TOTAL, TROPONIN I, CK MB No results for input(s): APTT in the last 168 hours. Recent Labs  Lab 01/18/18 1050  INR 1.5*    Extensive review of previous medical records completed.    Assessment: Patient Active Problem List   Diagnosis Date Noted  . Encounter for therapeutic drug monitoring 01/18/2018  . 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 Chronic diastolic congestive  heart failure ([place])   . 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 Liver masses 11/13/2017  . Non-rheumatic mitral regurgitation   . BMI 30.0-30.9,adult 07/24/2015  . Osteoporosis 09/19/2014  . GERD (gastroesophageal reflux disease) 07/29/2011  . Atrial flutter ([place]) 05/29/2010  . MITRAL REGURGITATION 01/09/2009  . Hyperlipidemia with target LDL less than 100 01/04/2009  . Essential hypertension 01/04/2009     Plan: [facility] TeleNeurology Consult Services  Impression: Stroke     Differential Diagnosis:  1. Cardioembolic stroke  2. Small vessel disease/lacune    3. Thromboembolic, artery-to-artery mechanism 4. Hypercoagulable state-related infarct  5. Thrombotic mechanism, large artery disease 6. Transient ischemic attack  Comments: Last known well:   2200 Door time:1110 [facility] contacted:   [phone] [facility] at bedside:   1201 NIHSS assessment time:1201 CT head reviewed at 8478 Needle time:TPA not considered since she is out of the TPA window     Discussion:     Our recommendations are outlined below.  Recommendations: STAT CTA head/neck and perfusion pending to evaluate for possible thrombectony ASA, IV fluids and permissive hypertension (Keep SBP < 220/110) Neurochecks DVT prophylaxis    Follow up with Neurology for further testing and evaluation  Medical Decision Making:  - Extensive number of diagnosis or management options are considered above. - Extensive amount of complex data reviewed.  - High risk of complication and/or morbidity or mortality are associated with differential diagnostic considerations above. - There may be uncertain outcome and increased probability of prolonged functional impairment or high probability of severe prolonged functional impairment associated with some of these differential diagnosis.  Medical Data Reviewed: 1.Data reviewed include clinical labs, radiology, Medical Tests; 2.Tests results discussed w/performing or  interpreting physician;    3.Obtaining/reviewing old medical records; 4.Obtaining case history from another source;    5.Independent review of image, tracing or specimen.  Signed: 01/24/2018  12:05 PM   Addendum - patient has a left M1 thrombus and large penumbra (115 cc) and small infarct(37 cc) . Case discussed with ED staff who has already arranged for transfer to neuro-IR.

## 2018-01-24 NOTE — ED Notes (Addendum)
Spoke with EDP wants Emergent neurology consult at this time.  Family at bedside and speaking with neurologist. Neuro ordered CT perfusion.

## 2018-01-24 NOTE — ED Provider Notes (Signed)
[facility] Provider Note   CSN: [id number] Arrival date & time: 01/24/18  1109   An emergency department physician performed an initial assessment on this suspected stroke patient at 22.  History   Chief Complaint Chief Complaint  Patient presents with  . Altered Mental Status    HPI [facility] is a 77 y.o. female.  HPI   77yF  ound poorly responsive by her family around 10 AM this morning.  She was found laying prone beside her bed.  Last seen normal at approximately 10 PM yesterday.  She is ambulatory and conversant at baseline.  She was reportedly in her usual state of health yesterday. She is s/p mitral valve repair ~1 month ago and on coumadin. History from EMS and cursory review of records. Pt is aphasic and cannot provide additional history.   Past Medical History:  Diagnosis Date  . Atrial flutter ([place])    Typical by EKG diagnosis 9/11 s/p CIT ablation 11/11  . Bradycardia   . Chronic diastolic congestive heart failure ([place])   . DJD (degenerative joint disease)   . Dysrhythmia   . Femur fracture, left ([place]) 2013  . Heart murmur   . Hyperlipidemia    x5 years  . Hypertension    Since 1997  . Liver masses 11/13/2017   Multiple small nodules seen on CT and MRI  . Mitral regurgitation    severe  . 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 TR (tricuspid regurgitation)    Mild with RA enlargment    Patient Active Problem List   Diagnosis Date Noted  . Encounter for therapeutic drug monitoring 01/18/2018  . 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 Chronic diastolic congestive heart failure ([place])   . 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 Liver masses 11/13/2017  . Non-rheumatic mitral regurgitation   . BMI 30.0-30.9,adult 07/24/2015  .  Osteoporosis 09/19/2014  . GERD (gastroesophageal reflux disease) 07/29/2011  . Atrial flutter ([place]) 05/29/2010  . MITRAL REGURGITATION 01/09/2009  . Hyperlipidemia with target LDL less than 100 01/04/2009  . Essential hypertension 01/04/2009    Past Surgical History:  Procedure Laterality Date  . A FLUTTER ABLATION N/A 06/27/2011   Procedure: ABLATION A FLUTTER;  Surgeon: Thompson Grayer, MD;  Location: [facility];  Service: Cardiovascular;  Laterality: N/A;  . ATRIAL ABLATION SURGERY  06/2010  . HIP SURGERY     Left (fracture) 3/13  . JOINT REPLACEMENT     both knees   . KNEE ARTHROSCOPY     Right  . LUMBAR SPINE SURGERY    . MITRAL VALVE REPAIR Right 01/07/2018   Procedure: MINIMALLY INVASIVE MITRAL VALVE REPAIR using LivaNova ring size 28 MM;  Surgeon: Rexene Alberts, MD;  Location: [place];  Service: Open Heart Surgery;  Laterality: Right;  . PATENT FORAMEN OVALE(PFO) CLOSURE N/A 01/07/2018   Procedure: PATENT FORAMEN OVALE (PFO) CLOSURE;  Surgeon: Rexene Alberts, MD;  Location: [place];  Service: Open Heart Surgery;  Laterality: N/A;  . RIGHT/LEFT HEART CATH AND CORONARY ANGIOGRAPHY N/A 11/11/2017   Procedure: RIGHT/LEFT HEART CATH AND CORONARY ANGIOGRAPHY;  Surgeon: Sherren Mocha, MD;  Location: [facility];  Service: Cardiovascular;  Laterality: N/A;  . TEE WITHOUT CARDIOVERSION N/A 09/18/2017   Procedure: TRANSESOPHAGEAL ECHOCARDIOGRAM (TEE);  Surgeon: Oval Linsey,  Jonelle Sidle, MD;  Location: [place];  Service: Cardiovascular;  Laterality: N/A;  . TEE WITHOUT CARDIOVERSION N/A 01/07/2018   Procedure: TRANSESOPHAGEAL ECHOCARDIOGRAM (TEE);  Surgeon: Rexene Alberts, MD;  Location: [place];  Service: Open Heart Surgery;  Laterality: N/A;  . TOTAL KNEE ARTHROPLASTY     Left  . TUBAL LIGATION       OB History   None      Home Medications    Prior to Admission medications   Medication Sig Start Date End Date Taking? Authorizing Provider  acetaminophen (TYLENOL) 500 MG  tablet Take 1,000 mg by mouth every 6 (six) hours as needed for moderate pain or headache.    [provider]  amiodarone (PACERONE) 200 MG tablet Take 1 tablet (200 mg total) by mouth daily. 01/14/18   Nani Skillern, PA-C  atorvastatin (LIPITOR) 40 MG tablet Take 1 tablet (40 mg total) by mouth at bedtime. 11/03/17   Hassell Done Mary-Margaret, FNP  ferrous sulfate 325 (65 FE) MG tablet Take 1 tablet (325 mg total) by mouth daily with breakfast. For one month then stop. 01/14/18 01/14/19  Nani Skillern, PA-C  hydrochlorothiazide (HYDRODIURIL) 25 MG tablet Take 1 tablet (25 mg total) by mouth daily. 01/14/18   Nani Skillern, PA-C  traMADol (ULTRAM) 50 MG tablet Take 50 mg by mouth every 4-6 hours PRN severe pain 01/14/18   Nani Skillern, PA-C  warfarin (COUMADIN) 2.5 MG tablet Take 1 tablet (2.5 mg total) by mouth daily at 6 PM. Or as directed 01/14/18   Nani Skillern, PA-C  famotidine (PEPCID) 20 MG tablet Take 1 tablet (20 mg total) by mouth 2 (two) times daily. 07/29/11 10/29/11  Liliane Shi, PA-C    Family History Family History  Problem Relation Age of Onset  . Hypertension Mother   . Cancer Sister        leukemia  . Diabetes Brother   . Stroke Sister   . Diabetes Brother   . Heart attack Brother   . Healthy Daughter   . Healthy Son   . Healthy Daughter     Social History Social History   Tobacco Use  . Smoking status: Never Smoker  . Smokeless tobacco: Never Used  Substance Use Topics  . Alcohol use: No  . Drug use: No     Allergies   Oxycodone-acetaminophen and Aspirin   Review of Systems Review of Systems  Level 5 caveat because pt is nonverbal.   Physical Exam Updated Vital Signs BP (!) 130/57 (BP Location: Left Arm)   Pulse (!) 47   Resp 20   Wt 71.7 kg (158 lb)   LMP 11/05/1992   SpO2 100%   BMI 27.99 kg/m   Physical Exam  Constitutional: She appears well-developed and well-nourished. No distress.  HENT:  Head:  Normocephalic.  Superficial abrasion to R face  Eyes: Conjunctivae are normal. Right eye exhibits no discharge. Left eye exhibits no discharge.  Neck: Neck supple.  Cardiovascular: Normal heart sounds. Exam reveals no gallop and no friction rub.  No murmur heard. irreg irreg. Bradycardic.   Pulmonary/Chest: Effort normal and breath sounds normal. No respiratory distress.  Abdominal: Soft. She exhibits no distension. There is no tenderness.  Musculoskeletal: She exhibits no edema or tenderness.  Neurological:  Laying in bed with eyes open. Nonverbal. Does not look past midline to the right. Moves L side spontaneously but not following commands. Increased tone R upper/lower extremities.   Skin: Skin is warm  and dry.  Psychiatric: She has a normal mood and affect. Her behavior is normal. Thought content normal.  Nursing note and vitals reviewed.    ED Treatments / Results  Labs (all labs ordered are listed, but only abnormal results are displayed) Labs Reviewed  PROTIME-INR - Abnormal; Notable for the following components:      Result Value   Prothrombin Time 20.7 (*)    All other components within normal limits  CBC - Abnormal; Notable for the following components:   WBC 11.4 (*)    RBC 3.16 (*)    Hemoglobin 10.1 (*)    HCT 31.0 (*)    All other components within normal limits  DIFFERENTIAL - Abnormal; Notable for the following components:   Neutro Abs 9.3 (*)    All other components within normal limits  COMPREHENSIVE METABOLIC PANEL - Abnormal; Notable for the following components:   CO2 21 (*)    Glucose, Bld 107 (*)    BUN 25 (*)    Creatinine, Ser 1.84 (*)    Albumin 3.3 (*)    GFR calc non Af Amer 25 (*)    GFR calc Af Amer 29 (*)    All other components within normal limits  I-STAT CHEM 8, ED - Abnormal; Notable for the following components:   BUN 23 (*)    Creatinine, Ser 1.70 (*)    Glucose, Bld 104 (*)    TCO2 18 (*)    Hemoglobin 10.2 (*)    HCT 30.0 (*)      All other components within normal limits  I-STAT TROPONIN, ED - Abnormal; Notable for the following components:   Troponin i, poc 0.40 (*)    All other components within normal limits  ETHANOL  APTT  RAPID URINE DRUG SCREEN, HOSP PERFORMED  URINALYSIS, ROUTINE W REFLEX MICROSCOPIC    EKG EKG Interpretation  Date/Time:  Sunday January 24 2018 11:47:47 EDT Ventricular Rate:  46 PR Interval:    QRS Duration: 126 QT Interval:  639 QTC Calculation: 560 R Axis:   -61 Text Interpretation:  Atrial fibrillation Ventricular premature complex Left bundle branch block Confirmed by Virgel Manifold [phone]) on 01/24/2018 12:04:20 PM   Radiology Ct Angio Head W Or Wo Contrast  Result Date: 01/24/2018 CLINICAL DATA:  77 y/o  F; acute stroke for follow-up. EXAM: CT ANGIOGRAPHY HEAD AND NECK CT PERFUSION BRAIN TECHNIQUE: Multidetector CT imaging of the head and neck was performed using the standard protocol during bolus administration of intravenous contrast. Multiplanar CT image reconstructions and MIPs were obtained to evaluate the vascular anatomy. Carotid stenosis measurements (when applicable) are obtained utilizing NASCET criteria, using the distal internal carotid diameter as the denominator. Multiphase CT imaging of the brain was performed following IV bolus contrast injection. Subsequent parametric perfusion maps were calculated using RAPID software. CONTRAST:  19mL ISOVUE-370 IOPAMIDOL (ISOVUE-370) INJECTION 76% COMPARISON:  01/24/2018 CT head. FINDINGS: CTA NECK FINDINGS Aortic arch: Bovine variant branching. Imaged portion shows no evidence of aneurysm or dissection. No significant stenosis of the major arch vessel origins. Mild calcific atherosclerosis. Right carotid system: No evidence of dissection, stenosis (50% or greater) or occlusion. Left carotid system: No evidence of dissection, stenosis (50% or greater) or occlusion. Vertebral arteries: Codominant. No evidence of dissection, stenosis  (50% or greater) or occlusion. Skeleton: Moderate cervical spondylosis with multilevel disc and facet degenerative changes greatest at the C4-C7 levels. No high-grade bony canal stenosis. Other neck: Negative. Upper chest: Small right pleural effusion. Review of  the MIP images confirms the above findings CTA HEAD FINDINGS Anterior circulation: Proximal left M1 occlusion with thrombus extending into the terminus with intermediate left MCA distribution collateralization. The thrombus appears approximately 8 mm in length (series 9, image 89). Mild calcific atherosclerosis of carotid siphons. Patent right MCA and bilateral ACA distributions with no additional large vessel occlusion, aneurysm, or significant stenosis. Posterior circulation: No significant stenosis, proximal occlusion, aneurysm, or vascular malformation. Venous sinuses: As permitted by contrast timing, patent. Anatomic variants: Right fetal PCA.  Complete circle-of-Willis. Review of the MIP images confirms the above findings CT Brain Perfusion Findings: CBF (<30%) Volume: 36mL Perfusion (Tmax>6.0s) volume: 171mL Mismatch Volume: 47mL Infarction Location:Left MCA distribution. IMPRESSION: 1. Left proximal M1 occlusion with thrombus measuring approximately 8 mm in length. Intermediate collaterals. 2. Left MCA distribution infarction with perfusion CT calculated core of 37 cc corresponding to the infarct on noncontrast CT. The calculated core may slightly underestimate the actual core as the region of infarct visible on noncontrast CT (ASPECTS 4) may have areas of pseudonormalization. 3. Large perfusion penumbra, 78 cc. 4. Patent carotid and vertebral arteries in the neck. No significant stenosis by NASCET criteria, dissection, or aneurysm. 5. Patent right MCA, bilateral ACA, and bilateral PCA distributions. These results were called by telephone at the time of interpretation on 01/24/2018 at 1:12 pm to Dr. Meryl Crutch , who verbally acknowledged these  results. Electronically Signed   By: Kristine Garbe M.D.   On: 01/24/2018 13:25   Ct Head Wo Contrast  Result Date: 01/24/2018 CLINICAL DATA:  Altered level of consciousness. History of neuroendocrine tumor EXAM: CT HEAD WITHOUT CONTRAST TECHNIQUE: Contiguous axial images were obtained from the base of the skull through the vertex without intravenous contrast. COMPARISON:  None. FINDINGS: Brain: Ill-defined hypodensity left MCA territory compatible with acute infarct. This involves the frontal and temporal lobe as well as some of the left parietal lobe. There is infarct involving the insula and basal ganglia. No associated hemorrhage. No midline shift. Ventricle size normal. Chronic infarct left cerebellum. Chronic microvascular ischemic change in the white matter. Vascular: Hyperdense distal left internal carotid artery and left MCA compatible with acute thrombosis. Skull: Negative Sinuses/Orbits: Paranasal sinuses clear.  Bilateral cataract surgery Other: None ASPECTS ([place] Stroke Program Early CT Score) - Ganglionic level infarction (caudate, lentiform nuclei, internal capsule, insula, M1-M3 cortex): 2 - Supraganglionic infarction (M4-M6 cortex): 2 Total score (0-10 with 10 being normal): 4 IMPRESSION: 1. Large territory acute left MCA infarct without hemorrhage. Hyperdense left internal carotid artery and left MCA compatible with acute thrombosis. 2. ASPECTS is 4 3. These results were called by telephone at the time of interpretation on 01/24/2018 at 11:50 am to Dr. Virgel Manifold , who verbally acknowledged these results. Electronically Signed   By: Franchot Gallo M.D.   On: 01/24/2018 11:50   Ct Angio Neck W Or Wo Contrast  Result Date: 01/24/2018 CLINICAL DATA:  77 y/o  F; acute stroke for follow-up. EXAM: CT ANGIOGRAPHY HEAD AND NECK CT PERFUSION BRAIN TECHNIQUE: Multidetector CT imaging of the head and neck was performed using the standard protocol during bolus administration of  intravenous contrast. Multiplanar CT image reconstructions and MIPs were obtained to evaluate the vascular anatomy. Carotid stenosis measurements (when applicable) are obtained utilizing NASCET criteria, using the distal internal carotid diameter as the denominator. Multiphase CT imaging of the brain was performed following IV bolus contrast injection. Subsequent parametric perfusion maps were calculated using RAPID software. CONTRAST:  182mL ISOVUE-370 IOPAMIDOL (ISOVUE-370)  INJECTION 76% COMPARISON:  01/24/2018 CT head. FINDINGS: CTA NECK FINDINGS Aortic arch: Bovine variant branching. Imaged portion shows no evidence of aneurysm or dissection. No significant stenosis of the major arch vessel origins. Mild calcific atherosclerosis. Right carotid system: No evidence of dissection, stenosis (50% or greater) or occlusion. Left carotid system: No evidence of dissection, stenosis (50% or greater) or occlusion. Vertebral arteries: Codominant. No evidence of dissection, stenosis (50% or greater) or occlusion. Skeleton: Moderate cervical spondylosis with multilevel disc and facet degenerative changes greatest at the C4-C7 levels. No high-grade bony canal stenosis. Other neck: Negative. Upper chest: Small right pleural effusion. Review of the MIP images confirms the above findings CTA HEAD FINDINGS Anterior circulation: Proximal left M1 occlusion with thrombus extending into the terminus with intermediate left MCA distribution collateralization. The thrombus appears approximately 8 mm in length (series 9, image 89). Mild calcific atherosclerosis of carotid siphons. Patent right MCA and bilateral ACA distributions with no additional large vessel occlusion, aneurysm, or significant stenosis. Posterior circulation: No significant stenosis, proximal occlusion, aneurysm, or vascular malformation. Venous sinuses: As permitted by contrast timing, patent. Anatomic variants: Right fetal PCA.  Complete circle-of-Willis. Review of  the MIP images confirms the above findings CT Brain Perfusion Findings: CBF (<30%) Volume: 39mL Perfusion (Tmax>6.0s) volume: 129mL Mismatch Volume: 26mL Infarction Location:Left MCA distribution. IMPRESSION: 1. Left proximal M1 occlusion with thrombus measuring approximately 8 mm in length. Intermediate collaterals. 2. Left MCA distribution infarction with perfusion CT calculated core of 37 cc corresponding to the infarct on noncontrast CT. The calculated core may slightly underestimate the actual core as the region of infarct visible on noncontrast CT (ASPECTS 4) may have areas of pseudonormalization. 3. Large perfusion penumbra, 78 cc. 4. Patent carotid and vertebral arteries in the neck. No significant stenosis by NASCET criteria, dissection, or aneurysm. 5. Patent right MCA, bilateral ACA, and bilateral PCA distributions. These results were called by telephone at the time of interpretation on 01/24/2018 at 1:12 pm to Dr. Meryl Crutch , who verbally acknowledged these results. Electronically Signed   By: Kristine Garbe M.D.   On: 01/24/2018 13:25   Ct Cerebral Perfusion W Contrast  Result Date: 01/24/2018 CLINICAL DATA:  77 y/o  F; acute stroke for follow-up. EXAM: CT ANGIOGRAPHY HEAD AND NECK CT PERFUSION BRAIN TECHNIQUE: Multidetector CT imaging of the head and neck was performed using the standard protocol during bolus administration of intravenous contrast. Multiplanar CT image reconstructions and MIPs were obtained to evaluate the vascular anatomy. Carotid stenosis measurements (when applicable) are obtained utilizing NASCET criteria, using the distal internal carotid diameter as the denominator. Multiphase CT imaging of the brain was performed following IV bolus contrast injection. Subsequent parametric perfusion maps were calculated using RAPID software. CONTRAST:  121mL ISOVUE-370 IOPAMIDOL (ISOVUE-370) INJECTION 76% COMPARISON:  01/24/2018 CT head. FINDINGS: CTA NECK FINDINGS Aortic arch:  Bovine variant branching. Imaged portion shows no evidence of aneurysm or dissection. No significant stenosis of the major arch vessel origins. Mild calcific atherosclerosis. Right carotid system: No evidence of dissection, stenosis (50% or greater) or occlusion. Left carotid system: No evidence of dissection, stenosis (50% or greater) or occlusion. Vertebral arteries: Codominant. No evidence of dissection, stenosis (50% or greater) or occlusion. Skeleton: Moderate cervical spondylosis with multilevel disc and facet degenerative changes greatest at the C4-C7 levels. No high-grade bony canal stenosis. Other neck: Negative. Upper chest: Small right pleural effusion. Review of the MIP images confirms the above findings CTA HEAD FINDINGS Anterior circulation: Proximal left M1 occlusion with  thrombus extending into the terminus with intermediate left MCA distribution collateralization. The thrombus appears approximately 8 mm in length (series 9, image 89). Mild calcific atherosclerosis of carotid siphons. Patent right MCA and bilateral ACA distributions with no additional large vessel occlusion, aneurysm, or significant stenosis. Posterior circulation: No significant stenosis, proximal occlusion, aneurysm, or vascular malformation. Venous sinuses: As permitted by contrast timing, patent. Anatomic variants: Right fetal PCA.  Complete circle-of-Willis. Review of the MIP images confirms the above findings CT Brain Perfusion Findings: CBF (<30%) Volume: 57mL Perfusion (Tmax>6.0s) volume: 126mL Mismatch Volume: 58mL Infarction Location:Left MCA distribution. IMPRESSION: 1. Left proximal M1 occlusion with thrombus measuring approximately 8 mm in length. Intermediate collaterals. 2. Left MCA distribution infarction with perfusion CT calculated core of 37 cc corresponding to the infarct on noncontrast CT. The calculated core may slightly underestimate the actual core as the region of infarct visible on noncontrast CT (ASPECTS 4)  may have areas of pseudonormalization. 3. Large perfusion penumbra, 78 cc. 4. Patent carotid and vertebral arteries in the neck. No significant stenosis by NASCET criteria, dissection, or aneurysm. 5. Patent right MCA, bilateral ACA, and bilateral PCA distributions. These results were called by telephone at the time of interpretation on 01/24/2018 at 1:12 pm to Dr. Meryl Crutch , who verbally acknowledged these results. Electronically Signed   By: Kristine Garbe M.D.   On: 01/24/2018 13:25    Procedures Procedures (including critical care time)  CRITICAL CARE Performed by: Virgel Manifold Total critical care time: 80 minutes Critical care time was exclusive of separately billable procedures and treating other patients. Critical care was necessary to treat or prevent imminent or life-threatening deterioration. Critical care was time spent personally by me on the following activities: development of treatment plan with patient and/or surrogate as well as nursing, discussions with consultants, evaluation of patient's response to treatment, examination of patient, obtaining history from patient or surrogate, ordering and performing treatments and interventions, ordering and review of laboratory studies, ordering and review of radiographic studies, pulse oximetry and re-evaluation of patient's condition.   Medications Ordered in ED Medications - No data to display   Initial Impression / Assessment and Plan / ED Course  I have reviewed the triage vital signs and the nursing notes.  Pertinent labs & imaging results that were available during my care of the patient were reviewed by me and considered in my medical decision making (see chart for details).     77yF with symptoms consistent with large vessel occlusion. CT with L MCA infarct with likely acute thrombosis of L internal carotid and MCA. Marland Kitchen Discussed with Dr Rory Percy, neurology. ~14 hours since LSN. Since at [facility], requesting stat  neurology consultation. Anticipate transfer after consultation.   Both tele-neurology and neurology service at [facility] consulted. In agreement to transfer to [facility] and possible endovascular intervention.   Pt to be ED to ED transfer. Discussed with Dr Kathrynn Humble, [place].   Final Clinical Impressions(s) / ED Diagnoses   Final diagnoses:  Arterial ischemic stroke, MCA (middle cerebral artery), left, acute [facility])    ED Discharge Orders    None       Virgel Manifold, MD 01/24/18 1337

## 2018-01-24 NOTE — ED Notes (Signed)
Attempted to call report; advised that nurse will call me back. 

## 2018-01-24 NOTE — Progress Notes (Signed)
Pt arrived on to the floor via stretcher.

## 2018-01-24 NOTE — ED Notes (Signed)
Pedal pulses marked Post tib/fib pulses marked.

## 2018-01-24 NOTE — ED Notes (Signed)
Patient in CT for perfusion.

## 2018-01-24 NOTE — ED Provider Notes (Signed)
Patient being transferred to [facility] from [facility] with a large vessel occlusion.  Patient last seen normal at 10 PM.  Patient arrived with a aphasia and was noted to have a left-sided stroke.  Neurology team at the bedside.  After extensive discussion with the neurology and IR team, patient's family has decided to proceed conservatively and have declined endovascular treatment. We will call medicine to admit shortly.   Varney Biles, MD 01/24/18 [phone]

## 2018-01-25 ENCOUNTER — Other Ambulatory Visit: Payer: Self-pay

## 2018-01-25 ENCOUNTER — Inpatient Hospital Stay (HOSPITAL_COMMUNITY): Payer: Medicare Other

## 2018-01-25 ENCOUNTER — Ambulatory Visit: Payer: Self-pay | Admitting: Thoracic Surgery (Cardiothoracic Vascular Surgery)

## 2018-01-25 DIAGNOSIS — I361 Nonrheumatic tricuspid (valve) insufficiency: Secondary | ICD-10-CM

## 2018-01-25 DIAGNOSIS — I10 Essential (primary) hypertension: Secondary | ICD-10-CM

## 2018-01-25 DIAGNOSIS — E785 Hyperlipidemia, unspecified: Secondary | ICD-10-CM

## 2018-01-25 DIAGNOSIS — I5032 Chronic diastolic (congestive) heart failure: Secondary | ICD-10-CM

## 2018-01-25 DIAGNOSIS — Z9889 Other specified postprocedural states: Secondary | ICD-10-CM

## 2018-01-25 LAB — BASIC METABOLIC PANEL
Anion gap: 11 (ref 5–15)
BUN: 17 mg/dL (ref 6–20)
CO2: 18 mmol/L — ABNORMAL LOW (ref 22–32)
Calcium: 8.6 mg/dL — ABNORMAL LOW (ref 8.9–10.3)
Chloride: 111 mmol/L (ref 101–111)
Creatinine, Ser: 1.47 mg/dL — ABNORMAL HIGH (ref 0.44–1.00)
GFR calc Af Amer: 39 mL/min — ABNORMAL LOW (ref >60)
GFR calc non Af Amer: 33 mL/min — ABNORMAL LOW (ref >60)
Glucose, Bld: 73 mg/dL (ref 65–99)
Potassium: 4.2 mmol/L (ref 3.5–5.1)
Sodium: 140 mmol/L (ref 135–145)

## 2018-01-25 LAB — LIPID PANEL
Cholesterol: 140 mg/dL (ref 0–200)
HDL: 47 mg/dL (ref >40)
LDL Cholesterol: 75 mg/dL (ref 0–99)
Total CHOL/HDL Ratio: 3 RATIO
Triglycerides: 89 mg/dL (ref <150)
VLDL: 18 mg/dL (ref 0–40)

## 2018-01-25 LAB — ECHOCARDIOGRAM COMPLETE
Height: 63 in
Weight: 2507.95 oz

## 2018-01-25 LAB — CBC
HCT: 34.1 % — ABNORMAL LOW (ref 36.0–46.0)
Hemoglobin: 10.8 g/dL — ABNORMAL LOW (ref 12.0–15.0)
MCH: 31.4 pg (ref 26.0–34.0)
MCHC: 31.7 g/dL (ref 30.0–36.0)
MCV: 99.1 fL (ref 78.0–100.0)
Platelets: 323 10*3/uL (ref 150–400)
RBC: 3.44 MIL/uL — ABNORMAL LOW (ref 3.87–5.11)
RDW: 14.2 % (ref 11.5–15.5)
WBC: 8 10*3/uL (ref 4.0–10.5)

## 2018-01-25 LAB — HEMOGLOBIN A1C
Hgb A1c MFr Bld: 5.4 % (ref 4.8–5.6)
Mean Plasma Glucose: 108.28 mg/dL

## 2018-01-25 MED ORDER — ATORVASTATIN CALCIUM 40 MG PO TABS
40.0000 mg | ORAL_TABLET | Freq: Every day | ORAL | Status: DC
  Administered 2018-01-25 – 2018-01-26 (×2): 40 mg via ORAL
  Filled 2018-01-25 (×2): qty 1

## 2018-01-25 NOTE — Progress Notes (Addendum)
STROKE TEAM PROGRESS NOTE  HPI ( Dr Rory Percy ) : Mickie Kay Bullard is a 77 y.o. female past medical history of atrial flutter, mitral regurgitation status post mitral valve repair 2 weeks ago, bradycardia, hyperlipidemia, who was in her usual state of health last known normal 10 PM 01/23/2018, noted to be weak on the right side and aphasic this morning.  Brought to [facility] for evaluation. 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 Noncontrast CT of the head showed a poor aspects-4 but the CT angiogram and perfusion study showed a score of 37 cc and penumbra of 115 cc. I received a call from the emergency room doctor at [facility] hospital, regarding this patient and potential transfer for intervention.  Patient by report is an  modified Rankin score of 1-2, only recently requiring walker after her cardiac surgery but otherwise able to take care of all her affairs and not requiring assistance for any ADLs. I spoke with the family member over the phone at [facility], and she was not sure if they would like to proceed with the surgery given the poor aspects and the risk of hemorrhage with such CT.  She asked for time to speak with her brother, who was coming from out of state.  In the interim, I initiated a transfer from ER to ER so that there is no delay in transferring the patient should they decide to proceed with the procedure.  I called the patient family back while the patient is being transported and the daughter said that they would like to proceed with the procedure.  At that time, I initiated the IR code stroke. Patient arrived at [facility].  Was taken directly to the IR suite.  The family arrived a little while after the patient's arrival.  Dr. Estanislado Pandy and I spoke with the family members, explained imaging findings and  her clinical picture.  After detailed discussions of risks and benefits, the family decided not to pursue IR procedure given the risk of bleeding.   LKW: 10 PM on 01/23/2018 tpa given?: no, initially evaluated at [facility], outside the window at presentation. Premorbid modified Rankin scale (mRS):1-2   INTERVAL HISTORY Her son,daughter and multiple family members are at the bedside.  Pt alert, eyes open, lying in the bed. Dr. Leonie Man had long discussion with family related to dx, tx options from yesterday that were discussed, and current treatment along with prognosis. She is at risk for neuro decline d/t cerebral edema from large stroke and clot. Discussed aggressive tx and palliative care as well.   Vitals:   01/25/18 0341 01/25/18 0409 01/25/18 0410 01/25/18 0640  BP: (!) 149/55 (!) 154/49 (!) 158/54   Pulse: (!) 45 (!) 45 (!) 47   Resp: (!) 24 (!) 24 (!) 26 (!) 24  Temp:   98.7 F (37.1 C)   TempSrc:   Oral   SpO2: 99% 99% 100%   Weight:      Height:    5\' 3"  (1.6 m)    CBC:  Recent Labs  Lab 01/24/18 1209 01/25/18 0618  WBC 11.4* 8.0  NEUTROABS 9.3*  --   HGB 10.1* 10.8*  HCT 31.0* 34.1*  MCV 98.1 99.1  PLT 346 703    Basic Metabolic Panel:  Recent Labs  Lab 01/24/18 1209 01/25/18  0618  NA 140 140  K 4.2 4.2  CL 108 111  CO2 21* 18*  GLUCOSE 107* 73  BUN 25* 17  CREATININE 1.84* 1.47*  CALCIUM 8.9 8.6*   Lipid Panel:     Component Value Date/Time   CHOL 140 01/25/2018 0618   CHOL 200 (H) 11/03/2017 1507   TRIG 89 01/25/2018 0618   TRIG 58 09/19/2014 0938   HDL 47 01/25/2018 0618   HDL 63 11/03/2017 1507   HDL 67 09/19/2014 0938   CHOLHDL 3.0 01/25/2018 0618   VLDL 18 01/25/2018 0618   LDLCALC 75 01/25/2018 0618   LDLCALC 106 (H) 11/03/2017 1507   LDLCALC 83 11/21/2013 0830   HgbA1c:  Lab Results  Component Value Date   HGBA1C 5.4 01/25/2018   Urine Drug Screen:     Component Value Date/Time   LABOPIA NONE DETECTED 01/24/2018 1327    COCAINSCRNUR NONE DETECTED 01/24/2018 1327   LABBENZ NONE DETECTED 01/24/2018 1327   AMPHETMU NONE DETECTED 01/24/2018 1327   THCU NONE DETECTED 01/24/2018 1327   LABBARB (A) 01/24/2018 1327    Result not available. Reagent lot number recalled by manufacturer.    Alcohol Level     Component Value Date/Time   ETH <10 01/24/2018 1209    IMAGING Ct Angio Head W Or Wo Contrast  Result Date: 01/24/2018 CLINICAL DATA:  77 y/o  F; acute stroke for follow-up. EXAM: CT ANGIOGRAPHY HEAD AND NECK CT PERFUSION BRAIN TECHNIQUE: Multidetector CT imaging of the head and neck was performed using the standard protocol during bolus administration of intravenous contrast. Multiplanar CT image reconstructions and MIPs were obtained to evaluate the vascular anatomy. Carotid stenosis measurements (when applicable) are obtained utilizing NASCET criteria, using the distal internal carotid diameter as the denominator. Multiphase CT imaging of the brain was performed following IV bolus contrast injection. Subsequent parametric perfusion maps were calculated using RAPID software. CONTRAST:  115mL ISOVUE-370 IOPAMIDOL (ISOVUE-370) INJECTION 76% COMPARISON:  01/24/2018 CT head. FINDINGS: CTA NECK FINDINGS Aortic arch: Bovine variant branching. Imaged portion shows no evidence of aneurysm or dissection. No significant stenosis of the major arch vessel origins. Mild calcific atherosclerosis. Right carotid system: No evidence of dissection, stenosis (50% or greater) or occlusion. Left carotid system: No evidence of dissection, stenosis (50% or greater) or occlusion. Vertebral arteries: Codominant. No evidence of dissection, stenosis (50% or greater) or occlusion. Skeleton: Moderate cervical spondylosis with multilevel disc and facet degenerative changes greatest at the C4-C7 levels. No high-grade bony canal stenosis. Other neck: Negative. Upper chest: Small right pleural effusion. Review of the MIP images confirms the above  findings CTA HEAD FINDINGS Anterior circulation: Proximal left M1 occlusion with thrombus extending into the terminus with intermediate left MCA distribution collateralization. The thrombus appears approximately 8 mm in length (series 9, image 89). Mild calcific atherosclerosis of carotid siphons. Patent right MCA and bilateral ACA distributions with no additional large vessel occlusion, aneurysm, or significant stenosis. Posterior circulation: No significant stenosis, proximal occlusion, aneurysm, or vascular malformation. Venous sinuses: As permitted by contrast timing, patent. Anatomic variants: Right fetal PCA.  Complete circle-of-Willis. Review of the MIP images confirms the above findings CT Brain Perfusion Findings: CBF (<30%) Volume: 85mL Perfusion (Tmax>6.0s) volume: 160mL Mismatch Volume: 51mL Infarction Location:Left MCA distribution. IMPRESSION: 1. Left proximal M1 occlusion with thrombus measuring approximately 8 mm in length. Intermediate collaterals. 2. Left MCA distribution infarction with perfusion CT calculated core of 37 cc corresponding to the infarct on noncontrast CT. The calculated core may  slightly underestimate the actual core as the region of infarct visible on noncontrast CT (ASPECTS 4) may have areas of pseudonormalization. 3. Large perfusion penumbra, 78 cc. 4. Patent carotid and vertebral arteries in the neck. No significant stenosis by NASCET criteria, dissection, or aneurysm. 5. Patent right MCA, bilateral ACA, and bilateral PCA distributions. These results were called by telephone at the time of interpretation on 01/24/2018 at 1:12 pm to Dr. Meryl Crutch , who verbally acknowledged these results. Electronically Signed   By: Kristine Garbe M.D.   On: 01/24/2018 13:25   Ct Head Wo Contrast  Result Date: 01/24/2018 CLINICAL DATA:  Follow up stroke. History of hypertension, hyperlipidemia, neuroendocrine tumor. EXAM: CT HEAD WITHOUT CONTRAST TECHNIQUE: Contiguous axial images  were obtained from the base of the skull through the vertex without intravenous contrast. COMPARISON:  CT/CTA HEAD January 24, 2018. FINDINGS: BRAIN: No intraparenchymal hemorrhage, mass effect nor midline shift. Hypodense LEFT basal ganglia, LEFT insular ribbon sign, loss of LEFT frontotemporal gray-white matter junction. Old LEFT cerebellar infarcts. No parenchymal brain volume loss for age. No hydrocephalus. No abnormal extra-axial fluid collections. Basal cisterns are patent. VASCULAR: Moderate calcific atherosclerosis of the carotid siphons. Dense LEFT insular MCA. SKULL: No skull fracture. Occluded RIGHT facial soft tissue swelling without subcutaneous gas or radiopaque foreign bodies. SINUSES/ORBITS: The mastoid air-cells and included paranasal sinuses are well-aerated.The included ocular globes and orbital contents are non-suspicious. Status post bilateral ocular lens implants. OTHER: None. IMPRESSION: 1. Evolving acute nonhemorrhagic LEFT MCA territory infarct. Dense LEFT MCA consistent with thromboembolism. 2. Old LEFT cerebellar infarct. Electronically Signed   By: Elon Alas M.D.   On: 01/24/2018 23:32   Ct Head Wo Contrast  Result Date: 01/24/2018 CLINICAL DATA:  Altered level of consciousness. History of neuroendocrine tumor EXAM: CT HEAD WITHOUT CONTRAST TECHNIQUE: Contiguous axial images were obtained from the base of the skull through the vertex without intravenous contrast. COMPARISON:  None. FINDINGS: Brain: Ill-defined hypodensity left MCA territory compatible with acute infarct. This involves the frontal and temporal lobe as well as some of the left parietal lobe. There is infarct involving the insula and basal ganglia. No associated hemorrhage. No midline shift. Ventricle size normal. Chronic infarct left cerebellum. Chronic microvascular ischemic change in the white matter. Vascular: Hyperdense distal left internal carotid artery and left MCA compatible with acute thrombosis. Skull:  Negative Sinuses/Orbits: Paranasal sinuses clear.  Bilateral cataract surgery Other: None ASPECTS ([place] Stroke Program Early CT Score) - Ganglionic level infarction (caudate, lentiform nuclei, internal capsule, insula, M1-M3 cortex): 2 - Supraganglionic infarction (M4-M6 cortex): 2 Total score (0-10 with 10 being normal): 4 IMPRESSION: 1. Large territory acute left MCA infarct without hemorrhage. Hyperdense left internal carotid artery and left MCA compatible with acute thrombosis. 2. ASPECTS is 4 3. These results were called by telephone at the time of interpretation on 01/24/2018 at 11:50 am to Dr. Virgel Manifold , who verbally acknowledged these results. Electronically Signed   By: Franchot Gallo M.D.   On: 01/24/2018 11:50   Ct Angio Neck W Or Wo Contrast  Result Date: 01/24/2018 CLINICAL DATA:  77 y/o  F; acute stroke for follow-up. EXAM: CT ANGIOGRAPHY HEAD AND NECK CT PERFUSION BRAIN TECHNIQUE: Multidetector CT imaging of the head and neck was performed using the standard protocol during bolus administration of intravenous contrast. Multiplanar CT image reconstructions and MIPs were obtained to evaluate the vascular anatomy. Carotid stenosis measurements (when applicable) are obtained utilizing NASCET criteria, using the distal internal carotid diameter as  the denominator. Multiphase CT imaging of the brain was performed following IV bolus contrast injection. Subsequent parametric perfusion maps were calculated using RAPID software. CONTRAST:  153mL ISOVUE-370 IOPAMIDOL (ISOVUE-370) INJECTION 76% COMPARISON:  01/24/2018 CT head. FINDINGS: CTA NECK FINDINGS Aortic arch: Bovine variant branching. Imaged portion shows no evidence of aneurysm or dissection. No significant stenosis of the major arch vessel origins. Mild calcific atherosclerosis. Right carotid system: No evidence of dissection, stenosis (50% or greater) or occlusion. Left carotid system: No evidence of dissection, stenosis (50% or greater) or  occlusion. Vertebral arteries: Codominant. No evidence of dissection, stenosis (50% or greater) or occlusion. Skeleton: Moderate cervical spondylosis with multilevel disc and facet degenerative changes greatest at the C4-C7 levels. No high-grade bony canal stenosis. Other neck: Negative. Upper chest: Small right pleural effusion. Review of the MIP images confirms the above findings CTA HEAD FINDINGS Anterior circulation: Proximal left M1 occlusion with thrombus extending into the terminus with intermediate left MCA distribution collateralization. The thrombus appears approximately 8 mm in length (series 9, image 89). Mild calcific atherosclerosis of carotid siphons. Patent right MCA and bilateral ACA distributions with no additional large vessel occlusion, aneurysm, or significant stenosis. Posterior circulation: No significant stenosis, proximal occlusion, aneurysm, or vascular malformation. Venous sinuses: As permitted by contrast timing, patent. Anatomic variants: Right fetal PCA.  Complete circle-of-Willis. Review of the MIP images confirms the above findings CT Brain Perfusion Findings: CBF (<30%) Volume: 90mL Perfusion (Tmax>6.0s) volume: 112mL Mismatch Volume: 30mL Infarction Location:Left MCA distribution. IMPRESSION: 1. Left proximal M1 occlusion with thrombus measuring approximately 8 mm in length. Intermediate collaterals. 2. Left MCA distribution infarction with perfusion CT calculated core of 37 cc corresponding to the infarct on noncontrast CT. The calculated core may slightly underestimate the actual core as the region of infarct visible on noncontrast CT (ASPECTS 4) may have areas of pseudonormalization. 3. Large perfusion penumbra, 78 cc. 4. Patent carotid and vertebral arteries in the neck. No significant stenosis by NASCET criteria, dissection, or aneurysm. 5. Patent right MCA, bilateral ACA, and bilateral PCA distributions. These results were called by telephone at the time of interpretation on  01/24/2018 at 1:12 pm to Dr. Meryl Crutch , who verbally acknowledged these results. Electronically Signed   By: Kristine Garbe M.D.   On: 01/24/2018 13:25   Mr Brain Wo Contrast  Result Date: 01/25/2018 CLINICAL DATA:  Follow up stroke. History of hypertension, hyperlipidemia and neuroendocrine tumor. EXAM: MRI HEAD WITHOUT CONTRAST TECHNIQUE: Multiplanar, multiecho pulse sequences of the brain and surrounding structures were obtained without intravenous contrast. COMPARISON:  CT HEAD January 24, 2018 FINDINGS: INTRACRANIAL CONTENTS: Confluent reduced diffusion LEFT frontoparietal and temporal lobes including insula, reduced diffusion LEFT basal ganglia. Subcentimeter discontinuous reduced diffusion LEFT parietal lobes. Areas of reduced diffusion demonstrate low ADC values, RIGHT FLAIR T2 hyperintense signal. Speckled susceptibility artifact LEFT frontal parietal lobes with additional chronic microhemorrhages bilateral occipital lobes and RIGHT parietal lobe. 2 mm LEFT-to-RIGHT midline shift. Patchy supratentorial white matter FLAIR T2 hyperintensities exclusive of the aforementioned abnormality compatible with mild chronic small vessel ischemic disease. Old LEFT cerebellar infarct. No parenchymal brain volume loss for age. No hydrocephalus. No abnormal extra-axial fluid collections. VASCULAR: Normal major intracranial vascular flow voids present at skull base. SKULL AND UPPER CERVICAL SPINE: No abnormal sellar expansion. No suspicious calvarial bone marrow signal. Craniocervical junction maintained. SINUSES/ORBITS: The mastoid air-cells and included paranasal sinuses are well-aerated.The included ocular globes and orbital contents are non-suspicious. Status post bilateral ocular lens implant. OTHER: Patient is edentulous.  IMPRESSION: 1. 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 2 mm LEFT-to-RIGHT midline shift. 2. Susceptibility artifact seen with old PRES, chronic hypertension, less  likely amyloid angiopathy. 3. Old LEFT cerebellar infarct. Electronically Signed   By: Elon Alas M.D.   On: 01/25/2018 06:10   Ct Cerebral Perfusion W Contrast  Result Date: 01/24/2018 CLINICAL DATA:  77 y/o  F; acute stroke for follow-up. EXAM: CT ANGIOGRAPHY HEAD AND NECK CT PERFUSION BRAIN TECHNIQUE: Multidetector CT imaging of the head and neck was performed using the standard protocol during bolus administration of intravenous contrast. Multiplanar CT image reconstructions and MIPs were obtained to evaluate the vascular anatomy. Carotid stenosis measurements (when applicable) are obtained utilizing NASCET criteria, using the distal internal carotid diameter as the denominator. Multiphase CT imaging of the brain was performed following IV bolus contrast injection. Subsequent parametric perfusion maps were calculated using RAPID software. CONTRAST:  161mL ISOVUE-370 IOPAMIDOL (ISOVUE-370) INJECTION 76% COMPARISON:  01/24/2018 CT head. FINDINGS: CTA NECK FINDINGS Aortic arch: Bovine variant branching. Imaged portion shows no evidence of aneurysm or dissection. No significant stenosis of the major arch vessel origins. Mild calcific atherosclerosis. Right carotid system: No evidence of dissection, stenosis (50% or greater) or occlusion. Left carotid system: No evidence of dissection, stenosis (50% or greater) or occlusion. Vertebral arteries: Codominant. No evidence of dissection, stenosis (50% or greater) or occlusion. Skeleton: Moderate cervical spondylosis with multilevel disc and facet degenerative changes greatest at the C4-C7 levels. No high-grade bony canal stenosis. Other neck: Negative. Upper chest: Small right pleural effusion. Review of the MIP images confirms the above findings CTA HEAD FINDINGS Anterior circulation: Proximal left M1 occlusion with thrombus extending into the terminus with intermediate left MCA distribution collateralization. The thrombus appears approximately 8 mm in length  (series 9, image 89). Mild calcific atherosclerosis of carotid siphons. Patent right MCA and bilateral ACA distributions with no additional large vessel occlusion, aneurysm, or significant stenosis. Posterior circulation: No significant stenosis, proximal occlusion, aneurysm, or vascular malformation. Venous sinuses: As permitted by contrast timing, patent. Anatomic variants: Right fetal PCA.  Complete circle-of-Willis. Review of the MIP images confirms the above findings CT Brain Perfusion Findings: CBF (<30%) Volume: 52mL Perfusion (Tmax>6.0s) volume: 148mL Mismatch Volume: 15mL Infarction Location:Left MCA distribution. IMPRESSION: 1. Left proximal M1 occlusion with thrombus measuring approximately 8 mm in length. Intermediate collaterals. 2. Left MCA distribution infarction with perfusion CT calculated core of 37 cc corresponding to the infarct on noncontrast CT. The calculated core may slightly underestimate the actual core as the region of infarct visible on noncontrast CT (ASPECTS 4) may have areas of pseudonormalization. 3. Large perfusion penumbra, 78 cc. 4. Patent carotid and vertebral arteries in the neck. No significant stenosis by NASCET criteria, dissection, or aneurysm. 5. Patent right MCA, bilateral ACA, and bilateral PCA distributions. These results were called by telephone at the time of interpretation on 01/24/2018 at 1:12 pm to Dr. Meryl Crutch , who verbally acknowledged these results. Electronically Signed   By: Kristine Garbe M.D.   On: 01/24/2018 13:25   2D Echocardiogram  - Left ventricle: Abnormal septal motion Systolic function was normal. The estimated ejection fraction was in the range of 50% to 55%. The study is not technically sufficient to allow evaluation of LV diastolic function. - Aortic valve: Valve area (VTI): 3.31 cm^2. Valve area (Vmax): 3.77 cm^2. Valve area (Vmean): 3.33 cm^2. 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 Trivial residual MR with somwwhat  tight repair and functional MS mean  gradient 7 mmHg peak 20 mmHg. Valve area by continuity equation (using LVOT flow): 1.63 cm^2. - Left atrium: The atrium was moderately dilated. - Right atrium: The atrium was moderately dilated. - Atrial septum: No defect or patent foramen ovale was identified. - Tricuspid valve: There was mild-moderate regurgitation. - Pulmonary arteries: PA peak pressure: 60 mm Hg (S).   PHYSICAL EXAM per Dr. Leonie Man GENERAL: Awake,  in NAD HEENT: - Normocephalic and atraumatic LUNGS - Clear to auscultation bilaterally with no wheezes CV - S1S2 RRR, no m/r/g, equal pulses bilaterally. Ext: warm, well perfused, intact peripheral pulses, no edema  NEURO:  Mental Status: drowsy but arouses easily.  She is globally aphasic. Unable to name.  Unable to comprehend.  Unable to repeat. Does interact and tries to follow gaze. Follows a few simple midline commands only. Cranial Nerves: PERRL.  Left gaze preference unable to cross midline, does not blink to threat from the right, right nasolabial fold flattening,  Motor: Right arm 1/5, right leg 2/5, left arm 5/5, left leg 4/5 Tone: is diminished on the right and bulk is normal Sensation-grossly reduced sensation to noxious stim on the right side Coordination: Unable to test Gait- deferred   ASSESSMENT/PLAN Ms. MARLETTA BOUSQUET is a 77 y.o. female with history of atrial flutter, mitral regurgitation status post mitral valve repair 2 weeks ago, bradycardia, hyperlipidemia found by family to have right sided weakness and aphasia the morning after going to bed ok the night before. Out of the window for tPA. High risk for IR due to pseudo normalization on CTP and increased risk of hemorrhage.  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 Hyperdense L ICA and L MCA c/w thrombosis. ASPECTS  4.     CTA head & neck L M1 occlusion with 80mm thrombus. L MCA infarct 37cc.    CT perfusion large penumbra 78cc  CT head 1/16 2315 evolving L MCA infarct. Dense L MCA. Old L cerebellar infarct  MRI  Large L MCA infarct w/ HT. 58mm L to R shift. Old L cerebellar infarct. Susceptibility infarct  2D Echo  EF 50-55%. No source of embolus. MV annuloplasty ring, no PFO seen  LDL 75  HgbA1c 5.4  SCDs for VTE prophylaxis  D3 thin  warfarin daily prior to admission, now on aspirin 300 mg suppository daily. Likely add anticoagulation in the future. For now, high risk of hemorrhage. Will hold and reassess as pt progresses.  Therapy recommendations:  CIR  Disposition:  pending (lives w/ dtr, someone with her 24/7)  Palliative consult requested for goals of care  Atrial Flutter  Home anticoagulation:  warfarin daily   INR 1.79 on admission  CHA2DS2-VASc Score = at least 7, ?2 oral anticoagulation recommended  Age in Years:  ?52   +2    Sex:  Female   Female   +1    Hypertension History:  yes   +1     Diabetes Mellitus:  0  Congestive Heart Failure History:  yes   +1  Vascular Disease History:  0  Stroke/TIA/Thromboembolism History:  yes   +2   Hypertension  Stable, 140-150s . Permissive hypertension (OK if < 220/120) but gradually normalize in 5-7 days . Long-term BP goal normotensive  Hyperlipidemia  Home meds:  lipitor 40  resume statin  LDL 75, goal <  13  Continue statin at discharge  Other Stroke Risk Factors  Advanced age  Family hx stroke (sister)  CPAP at home  Chronic diastolic CHF  Other Active Problems  MR s/p MVR 2 weeks ago (minimally invasive repair and PFO closure), on warfarin post op  GERD  Anemia of chronic disease  Hx bradycardia  Hospital day # [place], MSN, APRN, ANVP-BC, AGPCNP-BC Advanced Practice Stroke Nurse [place] for Schedule & Pager information 01/25/2018 12:42 PM  I have personally  examined this patient, reviewed notes, independently viewed imaging studies, participated in medical decision making and plan of care.ROS completed by me personally and pertinent positives fully documented  I have made any additions or clarifications directly to the above note. Agree with note above. .she presented with a large left MCA infarct but unfortunately was not a good thrombectomy candidate given advanced age, large stroke and being on anticoagulation. She remains at risk for neurological worsening due to cerebral edema and may need intubation and hypertonic saline if she gets worse. I had a long discussion the patient's son, daughter and other family members at the bedside and answered questions about her care. Family wants aggressive care for now but unwilling to talk to palliative care team further make long-term decisions. Discussed with Dr. Sarajane Jews. Greater than 50% time during this 35 minute visit was spent on counseling and coordination of care about her large stroke and answered questions Antony Contras, MD Medical Director [name] Pager: [phone] 01/25/2018 3:00 PM  To contact Stroke Continuity provider, please refer to [email or web address] After hours, contact General Neurology

## 2018-01-25 NOTE — Evaluation (Signed)
Speech Language Pathology Evaluation Patient Details Name: Brooke Chavez MRN: [id number] DOB: 1940-08-13 Today's Date: 01/25/2018 Time: 0814-4818 SLP Time Calculation (min) (ACUTE ONLY): 55 min  Problem List:  Patient Active Problem List   Diagnosis Date Noted  . Acute ischemic left MCA stroke ([place]) 01/24/2018  . Stroke (cerebrum) ([place]) 01/24/2018  . Encounter for therapeutic drug monitoring 01/18/2018  . 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 Chronic diastolic congestive heart failure ([place])   . 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 Liver masses 11/13/2017  . Non-rheumatic mitral regurgitation   . BMI 30.0-30.9,adult 07/24/2015  . Osteoporosis 09/19/2014  . GERD (gastroesophageal reflux disease) 07/29/2011  . Atrial flutter ([place]) 05/29/2010  . MITRAL REGURGITATION 01/09/2009  . Hyperlipidemia with target LDL less than 100 01/04/2009  . Essential hypertension 01/04/2009   Past Medical History:  Past Medical History:  Diagnosis Date  . Atrial flutter ([place])    Typical by EKG diagnosis 9/11 s/p CIT ablation 11/11  . Bradycardia   . Chronic diastolic congestive heart failure ([place])   . DJD (degenerative joint disease)   . Dysrhythmia   . Femur fracture, left ([place]) 2013  . Heart murmur   . Hyperlipidemia    x5 years  . Hypertension    Since 1997  . Liver masses 11/13/2017   Multiple small nodules seen on CT and MRI  . Mitral regurgitation    severe  . 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 TR (tricuspid regurgitation)    Mild with RA enlargment   Past Surgical History:  Past Surgical History:  Procedure Laterality Date  . A FLUTTER ABLATION N/A 06/27/2011   Procedure: ABLATION A FLUTTER;  Surgeon: Thompson Grayer, MD;  Location: [facility] CATH LAB;  Service: Cardiovascular;   Laterality: N/A;  . ATRIAL ABLATION SURGERY  06/2010  . HIP SURGERY     Left (fracture) 3/13  . JOINT REPLACEMENT     both knees   . KNEE ARTHROSCOPY     Right  . LUMBAR SPINE SURGERY    . MITRAL VALVE REPAIR Right 01/07/2018   Procedure: MINIMALLY INVASIVE MITRAL VALVE REPAIR using LivaNova ring size 28 MM;  Surgeon: Rexene Alberts, MD;  Location: [place];  Service: Open Heart Surgery;  Laterality: Right;  . PATENT FORAMEN OVALE(PFO) CLOSURE N/A 01/07/2018   Procedure: PATENT FORAMEN OVALE (PFO) CLOSURE;  Surgeon: Rexene Alberts, MD;  Location: [place];  Service: Open Heart Surgery;  Laterality: N/A;  . RIGHT/LEFT HEART CATH AND CORONARY ANGIOGRAPHY N/A 11/11/2017   Procedure: RIGHT/LEFT HEART CATH AND CORONARY ANGIOGRAPHY;  Surgeon: Sherren Mocha, MD;  Location: [place] CV LAB;  Service: Cardiovascular;  Laterality: N/A;  . TEE WITHOUT CARDIOVERSION N/A 09/18/2017   Procedure: TRANSESOPHAGEAL ECHOCARDIOGRAM (TEE);  Surgeon: Skeet Latch, MD;  Location: [place];  Service: Cardiovascular;  Laterality: N/A;  . TEE WITHOUT CARDIOVERSION N/A 01/07/2018   Procedure: TRANSESOPHAGEAL ECHOCARDIOGRAM (TEE);  Surgeon: Rexene Alberts, MD;  Location: [place];  Service: Open Heart Surgery;  Laterality: N/A;  . TOTAL KNEE ARTHROPLASTY     Left  . TUBAL LIGATION     HPI:  Brooke Chavez is a 77 y.o. female past medical history of atrial flutter, mitral regurgitation status post mitral valve repair 2 weeks ago, bradycardia, hyperlipidemia, who was in her usual state  of health last known normal 10 PM 01/23/2018, noted to be weak on the right side and aphasic. MRI shows Large LEFT MCA territory infarct with petechial hemorrhage. 2 mm LEFT-to-RIGHT midline shift.     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 Pt is unable to  accurately follow any distal commands, did attempt some facial commands though groping behavior present. No accuracy to Y/N questions. Pt is however very attentive and can interpret facial expressions, context and prosody with some success. When asked to participate in automatic language tasks, pt opens mouth and approximates articulation, but needs articualtory cue to shape her name and counting. "OK" occurs spontaneously at times. Minimal dysarthria, moderate right neglect, but can attend to midline and slightly right with cues. Tries to initiate tasks with right UE. Introduced education and strategies to family, will continue efforts for functional communication acutely, recommend CIR at d/c.     SLP Assessment  SLP Recommendation/Assessment: Patient needs continued Speech Lanaguage Pathology Services SLP Visit Diagnosis: Aphasia (R47.01)    Follow Up Recommendations  Inpatient Rehab    Frequency and Duration min 2x/week  2 weeks      SLP Evaluation Cognition  Overall Cognitive Status: Impaired/Different from baseline Arousal/Alertness: Awake/alert Orientation Level: Oriented to person Attention: Sustained;Selective Sustained Attention: Appears intact Selective Attention: Appears intact Memory: (UTA) Awareness: Impaired Awareness Impairment: Intellectual impairment;Emergent impairment Problem Solving: Impaired Problem Solving Impairment: Verbal basic;Functional basic       Comprehension  Auditory Comprehension Overall Auditory Comprehension: Impaired Yes/No Questions: Impaired Basic Biographical Questions: 0-25% accurate Commands: Impaired One Step Basic Commands: 0-24% accurate Visual Recognition/Discrimination Discrimination: Exceptions to [facility] Common Objects: Unable to indentify Reading Comprehension Reading Status: Not tested    Expression Verbal Expression Overall Verbal Expression: Impaired Initiation: Impaired Automatic Speech: Name;Counting Level of  Generative/Spontaneous Verbalization: Word Repetition: Impaired Level of Impairment: Word level Naming: Not tested Common Objects: Unable to indentify Pragmatics: No impairment Effective Techniques: Melodic intonation;Articulatory cues Written Expression Dominant Hand: Right Written Expression: Not tested   Oral / Motor  Oral Motor/Sensory Function Overall Oral Motor/Sensory Function: Mild impairment Facial ROM: Suspected CN VII (facial) dysfunction;Reduced right Facial Symmetry: Abnormal symmetry right;Suspected CN VII (facial) dysfunction Facial Strength: Reduced right;Suspected CN VII (facial) dysfunction Lingual ROM: Within Functional Limits Lingual Symmetry: Within Functional Limits Lingual Strength: Within Functional Limits Lingual Sensation: Within Functional Limits Velum: Within Functional Limits Mandible: Within Functional Limits Motor Speech Overall Motor Speech: Impaired Respiration: Within functional limits Phonation: Normal Resonance: Within functional limits Articulation: Impaired Level of Impairment: Word Intelligibility: Intelligible Motor Planning: Impaired Motor Speech Errors: Groping for words   GO                   Herbie Baltimore, MA CCC-SLP [phone]  Lynann Beaver 01/25/2018, 10:00 AM

## 2018-01-25 NOTE — Evaluation (Signed)
Physical Therapy Evaluation Patient Details Name: Brooke Chavez MRN: [id number] DOB: 09/02/1940 Today's Date: 01/25/2018   History of Present Illness  77 year old female who initially presented to [facility] on 01/24/2018 with aphasia, noted to have right-sided weakness, right hemianopsia, right facial droop and was found to have an acute ischemic MCA stroke in the setting of recent mitral valve repair. Patient was transferred to [facility] for possible endovascular intervention of the left M1 occlusion however, [facility], the patient's family decided not to pursue IR procedure given the increased risk of bleeding. Family at bedside deny patient having complaints of chest pain, palpitations, any recent infection, long distance travel, dizziness or syncope. Patient has no hx of tobacco or alcohol use with no prior history of stroke. Information obtained from daughter and son at bedside given pt current condition.   Clinical Impression  [name] is a very pleasant 77 y/o female admitted with the above listed diagnosis. Daughter at bedside providing history as patient is unable. Prior to admission patient was independent with mobility and ADLs without AD - some reduction in mobility in the past 2 weeks due to recent mitral valve repair. Today patient requiring Mod A +2 for bed mobility, STS, and stand-pivot due to R UE/LE weakness, reduced balance, as well as reduced safety awareness and difficulty following commands. Does attempt to initiate mobility/transfer, but unable to complete successfully without assist. PT currently recommending CIR level care to maximize safety and mobility. PT to continue to follow acutely to address deficits listed below.     Follow Up Recommendations CIR;Supervision/Assistance - 24 hour    Equipment Recommendations  Other (comment)(TBD)    Recommendations for Other Services Rehab consult     Precautions / Restrictions Precautions Precautions:  Fall Restrictions Weight Bearing Restrictions: No      Mobility  Bed Mobility Overal bed mobility: Needs Assistance Bed Mobility: Rolling;Supine to Sit Rolling: Mod assist   Supine to sit: Mod assist;+2 for physical assistance     General bed mobility comments: Mod A - good initiation of mobility/transfer, but unable to complete independently  Transfers Overall transfer level: Needs assistance Equipment used: 2 person hand held assist Transfers: Sit to/from [facility] Sit to Stand: Mod assist;+2 physical assistance Stand pivot transfers: Mod assist;+2 physical assistance       General transfer comment: Mod A + 2 due to R LE weakness and reduced intentional motion. Does attempt to weight shift and slide/shuffle R LE  Ambulation/Gait                Stairs            Wheelchair Mobility    Modified Rankin (Stroke Patients Only) Modified Rankin (Stroke Patients Only) Pre-Morbid Rankin Score: No symptoms Modified Rankin: Severe disability     Balance Overall balance assessment: Needs assistance Sitting-balance support: Single extremity supported;Feet supported Sitting balance-Leahy Scale: Poor     Standing balance support: Bilateral upper extremity supported;During functional activity Standing balance-Leahy Scale: Poor                               Pertinent Vitals/Pain Pain Assessment: Faces Faces Pain Scale: No hurt    Home Living Family/patient expects to be discharged to:: Private residence Living Arrangements: Children;Other relatives Available Help at Discharge: Family;Available 24 hours/day Type of Home: Mobile home Home Access: Stairs to enter Entrance Stairs-Rails: None Entrance Stairs-Number of Steps: 3 Home Layout: One  level Home Equipment: None      Prior Function Level of Independence: Independent         Comments: independent up until cardiac surgery (~2 weeks ago)     Hand Dominance    Dominant Hand: Right    Extremity/Trunk Assessment   Upper Extremity Assessment Upper Extremity Assessment: Defer to OT evaluation    Lower Extremity Assessment Lower Extremity Assessment: RLE deficits/detail RLE Deficits / Details: R LE grossly 1-2/5       Communication   Communication: Receptive difficulties;Expressive difficulties  Cognition Arousal/Alertness: Awake/alert   Overall Cognitive Status: Impaired/Different from baseline Area of Impairment: Orientation;Attention;Following commands;Safety/judgement;Problem solving                 Orientation Level: Disoriented to;Place;Time;Situation Current Attention Level: Focused   Following Commands: Follows one step commands inconsistently;Follows one step commands with increased time Safety/Judgement: Decreased awareness of safety;Decreased awareness of deficits   Problem Solving: Slow processing;Decreased initiation;Difficulty sequencing;Requires verbal cues;Requires tactile cues        General Comments      Exercises     Assessment/Plan    PT Assessment Patient needs continued PT services  PT Problem List Decreased strength;Decreased range of motion;Decreased activity tolerance;Decreased balance;Decreased mobility;Decreased coordination;Decreased cognition;Decreased knowledge of use of DME;Decreased safety awareness;Impaired sensation;Decreased knowledge of precautions       PT Treatment Interventions DME instruction;Gait training;Functional mobility training;Therapeutic activities;Therapeutic exercise;Balance training;Neuromuscular re-education;Cognitive remediation;Patient/family education    PT Goals (Current goals can be found in the Care Plan section)  Acute Rehab PT Goals Patient Stated Goal: none stated PT Goal Formulation: With patient/family Time For Goal Achievement: 02/08/18 Potential to Achieve Goals: Fair    Frequency Min 4X/week   Barriers to discharge        Co-evaluation PT/OT/SLP  Co-Evaluation/Treatment: Yes Reason for Co-Treatment: Complexity of the patient's impairments (multi-system involvement);For patient/therapist safety PT goals addressed during session: Mobility/safety with mobility OT goals addressed during session: ADL's and self-care       AM-PAC PT "6 Clicks" Daily Activity  Outcome Measure Difficulty turning over in bed (including adjusting bedclothes, sheets and blankets)?: Unable Difficulty moving from lying on back to sitting on the side of the bed? : Unable Difficulty sitting down on and standing up from a chair with arms (e.g., wheelchair, bedside commode, etc,.)?: Unable Help needed moving to and from a bed to chair (including a wheelchair)?: A Lot Help needed walking in hospital room?: A Lot Help needed climbing 3-5 steps with a railing? : Total 6 Click Score: 8    End of Session Equipment Utilized During Treatment: Gait belt Activity Tolerance: Patient tolerated treatment well Patient left: in chair;with call bell/phone within reach;with family/visitor present Nurse Communication: Mobility status PT Visit Diagnosis: Unsteadiness on feet (R26.81);Other abnormalities of gait and mobility (R26.89);Muscle weakness (generalized) (M62.81);Hemiplegia and hemiparesis Hemiplegia - Right/Left: Right Hemiplegia - dominant/non-dominant: Dominant Hemiplegia - caused by: Cerebral infarction    Time: 2263-3354 PT Time Calculation (min) (ACUTE ONLY): 24 min   Charges:   PT Evaluation $PT Eval Moderate Complexity: 1 Mod     PT G Codes:       Lanney Gins, PT, DPT 01/25/18 4:17 PM

## 2018-01-25 NOTE — Progress Notes (Signed)
PROGRESS NOTE  Brooke Chavez VOZ:[id number] DOB: [birth year] DOA: 01/24/2018 PCP: [name], FNP  Brief Narrative: 77 year old woman status post recent mitral valve repair, presented with stroke symptomatology.  Found to have large left MCA stroke, intervention was entertained and the patient was transferred to [facility] where ultimately the family decided against endovascular intervention.  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 --Prognosis guarded, at risk for decline secondary to cerebral edema.  Should the patient follow-up lethargy or become unresponsive, will need transfer to ICU and critical care consultation unless family elects to proceed with comfort care.  If aggressive management desired, consider hypertonic saline. --Management per stroke team including aspirin.  Consider anticoagulation in the future, currently at high risk for hemorrhage. --Dysphagia 3 (Mech soft);Thin liquid per ST  Status post minimally invasive mitral valve repair 5/30.  Chronic diastolic CHF --Appears stable.  Anemia of chronic disease --stable  Essential hypertension --Permissive hypertension  Hyperlipidemia --Continue statin  DVT prophylaxis: SCDs Code Status: Full Family Communication: multiple at bedside Disposition Plan: pending    Murray Hodgkins, MD  [facility] Direct contact: [phone] --[place] [place]  --[email or web address]; password [email or web address]  7PM-7AM contact night coverage as above 01/25/2018, 4:20 PM  LOS: 1 day   Consultants:  Neurology   Cardiology   Procedures:  Echo Study Conclusions  - Left ventricle: Abnormal septal motion Systolic function was   normal. The estimated ejection fraction was in the range of 50%   to 55%. The study is not technically sufficient to allow   evaluation of LV diastolic function. - Aortic valve: Valve area (VTI): 3.31 cm^2.  Valve area (Vmax):   3.77 cm^2. Valve area (Vmean): 3.33 cm^2. 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 Trivial   residual MR with somwwhat tight repair and functional MS mean   gradient 7 mmHg peak 20 mmHg. Valve area by continuity equation   (using LVOT flow): 1.63 cm^2. - Left atrium: The atrium was moderately dilated. - Right atrium: The atrium was moderately dilated. - Atrial septum: No defect or patent foramen ovale was identified. - Tricuspid valve: There was mild-moderate regurgitation. - Pulmonary arteries: PA peak pressure: 60 mm Hg (S).  Antimicrobials:    Interval history/Subjective: Expressive aphasia. Follows simple commands.  Objective: Vitals:  Vitals:   01/25/18 0800 01/25/18 1200  BP:    Pulse:    Resp:    Temp: 97.7 F (36.5 C) 97.8 F (36.6 C)  SpO2:      Exam:  Constitutional:  . Appears calm and comfortable Eyes:  . pupils and irises appear normal . Large hematoma to the right side of the face over zygomatic arch. ENMT:  . grossly normal hearing  Respiratory:  . CTA bilaterally, no w/r/r.  . Respiratory effort normal.  Cardiovascular:  Marland Kitchen Marked sinus bradycardia on telemetry.  Regular rhythm, no m/r/g . No LE extremity edema   Musculoskeletal:  . Dense right hemiparesis. . Moves left arm and left leg to command. Neurologic:  . Right facial weakness noted, otherwise as above Psychiatric:  . Alert and follows simple commands   I have personally reviewed the following:   Labs:  Creatinine at baseline, remainder BMP unremarkable  Hemoglobin stable 10.8  Imaging studies:  MRI, CT noted  Scheduled Meds: .  stroke: mapping our early stages of recovery book   Does not apply Once  . aspirin  300 mg Rectal Daily  . atorvastatin  40 mg Oral QHS   Continuous Infusions: . sodium chloride 75 mL/hr at 01/24/18 2315    Principal Problem:   Acute ischemic left MCA stroke (HCC) Active Problems:   Hyperlipidemia with target  LDL less than 100   Essential hypertension   Atrial flutter (HCC)   GERD (gastroesophageal reflux disease)   Chronic diastolic congestive heart failure (H[facility])   S/P minimally invasive mitral valve repair   Stroke (cerebrum) ([place])   LOS: 1 day

## 2018-01-25 NOTE — Progress Notes (Signed)
Rehab Admissions Coordinator Note:  Patient was screened by Cleatrice Burke for appropriateness for an Inpatient Acute Rehab Consult per PT and OT recommendation.   At this time, we are recommending Inpatient Rehab consult. I will contact Dr. Sarajane Jews for order.  Danne Baxter, RN, MSN Rehab Admissions Coordinator [phone] 01/25/2018 5:03 PM

## 2018-01-25 NOTE — Evaluation (Signed)
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 Patient was transferred to [facility] for possible endovascular intervention of the left M1 occlusion however, once transported to [facility], the patient's family decided not to pursue IR procedure given the increased risk of bleeding. Family at bedside deny patient having complaints of chest pain, palpitations, any recent infection, long distance travel, dizziness or syncope. Patient has no hx of tobacco or alcohol use with no prior history of stroke. Information obtained from daughter and son at bedside given pt current condition.    Clinical Impression   Patient presenting with decreased I in self care, balance, functional mobility/transfers, R UE/LE strength, coordination disorder, visual deficits. Patient's family report her being independent PTA without need of AD. She lives with one of her daughters. Patient currently functioning at mod -+2 assistance for safety with mobility. Patient will benefit from acute OT to increase overall independence in the areas of ADLs, functional mobility, cognition in order to safely discharge to next venue of care.    Follow Up Recommendations  CIR;Supervision/Assistance - 24 hour    Equipment Recommendations  Other (comment)(defer to next venue of care)    Recommendations for Other Services Rehab consult     Precautions / Restrictions Precautions Precautions: Fall Restrictions Weight Bearing Restrictions: No      Mobility Bed Mobility Overal bed mobility: Needs Assistance Bed Mobility: Rolling;Supine to Sit Rolling: Max assist   Supine to sit: +2 for  safety/equipment;Mod assist     General bed mobility comments: verbal cuing and manual facilitation for sequence and hand placement. Assistance for R LE and trunk  Transfers Overall transfer level: Needs assistance Equipment used: None Transfers: Sit to/from [facility] Sit to Stand: Mod assist;+2 physical assistance Stand pivot transfers: Mod assist;+2 physical assistance       General transfer comment: Mod A + 2 due to R LE weakness and reduced intentional motion. Does attempt to weight shift and slide/shuffle R LE    Balance Overall balance assessment: Needs assistance Sitting-balance support: Single extremity supported;Feet supported Sitting balance-Leahy Scale: Poor     Standing balance support: Bilateral upper extremity supported;During functional activity Standing balance-Leahy Scale: Poor            ADL either performed or assessed with clinical judgement   ADL Overall ADL's : Needs assistance/impaired     Grooming: Wash/dry hands;Wash/dry face;Oral care;Minimal assistance;Supervision/safety;Set up;Sitting   Upper Body Bathing: Cueing for sequencing;Maximal assistance   Lower Body Bathing: Total assistance   Upper Body Dressing : Maximal assistance   Lower Body Dressing: Total assistance   Toilet Transfer: +2 for safety/equipment   Toileting- Clothing Manipulation and Hygiene: Total assistance       Functional mobility during ADLs: Moderate assistance;+2 for physical assistance       Vision Baseline Vision/History: Wears glasses Wears Glasses: Reading only Patient Visual Report: No change from baseline Vision Assessment?: Vision impaired- to be further tested in functional context Additional Comments: increased time and mod cuing to attend to R side and visually scan to R side            Pertinent Vitals/Pain Pain Assessment: Faces Faces Pain Scale: No hurt  Hand Dominance Right   Extremity/Trunk Assessment Upper  Extremity Assessment Upper Extremity Assessment: Defer to OT evaluation   Lower Extremity Assessment Lower Extremity Assessment: RLE deficits/detail RLE Deficits / Details: R LE grossly 1-2/5       Communication Communication Communication: Receptive difficulties;Expressive difficulties   Cognition Arousal/Alertness: Awake/alert   Overall Cognitive Status: Impaired/Different from baseline Area of Impairment: Orientation;Attention;Following commands;Safety/judgement;Problem solving                 Orientation Level: Disoriented to;Place;Time;Situation Current Attention Level: Focused   Following Commands: Follows one step commands inconsistently;Follows one step commands with increased time Safety/Judgement: Decreased awareness of safety;Decreased awareness of deficits   Problem Solving: Slow processing;Decreased initiation;Difficulty sequencing;Requires verbal cues;Requires tactile cues                Home Living Family/patient expects to be discharged to:: Private residence Living Arrangements: Children;Other relatives Available Help at Discharge: Family;Available 24 hours/day Type of Home: Mobile home Home Access: Stairs to enter Entrance Stairs-Number of Steps: 3 Entrance Stairs-Rails: None Home Layout: One level     Bathroom Shower/Tub: Teacher, early years/pre: Standard     Home Equipment: None          Prior Functioning/Environment Level of Independence: Independent        Comments: independent up until cardiac surgery (~2 weeks ago)        OT Problem List: Impaired balance (sitting and/or standing);Decreased strength;Decreased cognition;Pain;Impaired vision/perception;Decreased safety awareness;Decreased activity tolerance;Decreased coordination;Decreased knowledge of use of DME or AE;Impaired sensation;Impaired UE functional use      OT Treatment/Interventions: Self-care/ADL training;Visual/perceptual  remediation/compensation;Therapeutic exercise;Patient/family education;Neuromuscular education;Balance training;Energy conservation;Therapeutic activities;DME and/or AE instruction;Cognitive remediation/compensation    OT Goals(Current goals can be found in the care plan section) Acute Rehab OT Goals Patient Stated Goal: none stated Time For Goal Achievement: 02/08/18 Potential to Achieve Goals: Fair ADL Goals Pt Will Perform Grooming: with supervision;with set-up;sitting Pt Will Perform Upper Body Bathing: with min assist Pt Will Perform Lower Body Bathing: with min assist;sit to/from stand Pt Will Perform Upper Body Dressing: with min assist Pt Will Perform Lower Body Dressing: with min assist Pt Will Transfer to Toilet: with min assist Pt Will Perform Toileting - Clothing Manipulation and hygiene: with min assist  OT Frequency: Min 2X/week   Barriers to D/C: Other (comment)  none known at this time       Co-evaluation PT/OT/SLP Co-Evaluation/Treatment: Yes Reason for Co-Treatment: Complexity of the patient's impairments (multi-system involvement);For patient/therapist safety PT goals addressed during session: Mobility/safety with mobility OT goals addressed during session: ADL's and self-care      AM-PAC PT "6 Clicks" Daily Activity     Outcome Measure Help from another person eating meals?: Total Help from another person taking care of personal grooming?: Total Help from another person toileting, which includes using toliet, bedpan, or urinal?: Total Help from another person bathing (including washing, rinsing, drying)?: Total Help from another person to put on and taking off regular upper body clothing?: Total Help from another person to put on and taking off regular lower body clothing?: Total 6 Click Score: 6   End of Session Equipment Utilized During Treatment: Gait belt  Activity Tolerance: Patient tolerated treatment well Patient left: in chair;with call bell/phone  within reach;with chair alarm set;with family/visitor present  OT Visit Diagnosis: Muscle weakness (generalized) (M62.81);Cognitive communication deficit (R41.841);Hemiplegia and hemiparesis Symptoms and signs involving cognitive functions: Cerebral infarction Hemiplegia - Right/Left: Right Hemiplegia - dominant/non-dominant: Dominant Hemiplegia -  caused by: Cerebral infarction                Time: 3414-4360 OT Time Calculation (min): 24 min Charges:  OT General Charges $OT Visit: 1 Visit OT Evaluation $OT Eval Moderate Complexity: 1 Mod  Tashema Tiller P, MS, OTR/L 01/25/2018, 4:10 PM

## 2018-01-25 NOTE — Progress Notes (Signed)
  Echocardiogram 2D Echocardiogram has been performed.  Brooke Chavez 01/25/2018, 9:01 AM

## 2018-01-25 NOTE — Consult Note (Addendum)
Cardiology Consultation:   Patient ID: Brooke Chavez; [id number]; [birth year]   Admit date: 01/24/2018 Date of Consult: 01/25/2018  Primary Care Provider: [name], FNP Primary Cardiologist: Dr. [name], MD  Patient Profile:   Brooke Chavez is a 77 y.o. female with a hx of mitral valve disease, s/p atrial valve repair/MAZE and PFO closure (with prosthetic valve) 01/07/2018 and discharged on Coumadin 01/14/2018, HTN, HLD and atrial flutter (was on Eliquis prior to surgery) who is being seen today for the evaluation of  at the request of Dr. Evangeline Gula.   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 Patient was transferred to [facility] for possible endovascular intervention of the left M1 occlusion however, once transported to [facility], the patient's family decided not to pursue IR procedure given the increased risk of bleeding. Family at bedside deny patient having complaints of chest pain, palpitations, any recent infection, long distance travel, dizziness or syncope. Patient has no hx of tobacco or alcohol use with no prior history of stroke. Information obtained from daughter and son at bedside given pt current condition.   Brooke Chavez was initially seen on 11/02/2017 by [facility] with plans for elective minimally invasive mitral valve repair/MAZE and PFO closure for progressively worsening severe mitral regurgitation per Dr. Percival Spanish (per echo with elongation, consistent with myxomatous proliferation. Moderate prolapse, involving the middle segment of the anterior leaflet and the medial scallop of the posterior leaflet. There was severe regurgitation directed eccentrically and posteriorly. Severe regurgitation is suggested by pulmonary vein systolic  flow reversal). Preoperative pans were made with a TEE which was completed on 09/18/2017. Patient underwent a right and left cardiac cath on 11/11/2016 prior to surgery which revealed widely patent coronary arteries without obstructive CAD essentially, normal heart hemodynamics, and significant mitral regurgitation.    Patient underwent MVR/MAZE and PFO closure on 01/07/2018 and was discharged on 01/14/2018.  She was discharged on Coumadin 2.5 mg daily.  At that time, PT/INR were noted to be 18.3 and 1.54.  She was then seen for an anticoagulation visit on 01/18/2018 with an INR of 1.5.  Plans were for her to start taking 1 tablet (2.5 mg) daily except 2 tablets on Mondays, Wednesdays and Fridays. She was to have an INR check on 01/27/2018.  At time of hospital presentation, the patient's INR was subtherapeutic at 1.7.  Cardiology has been consulted for anticoagulation and amiodarone management.   Past Medical History:  Diagnosis Date  . Atrial flutter ([place])    Typical by EKG diagnosis 9/11 s/p CIT ablation 11/11  . Bradycardia   . Chronic diastolic congestive heart failure ([place])   . DJD (degenerative joint disease)   . Dysrhythmia   . Femur fracture, left ([place]) 2013  . Heart murmur   . Hyperlipidemia    x5 years  . Hypertension    Since 1997  . Liver masses 11/13/2017   Multiple small nodules seen on CT and MRI  . Mitral regurgitation    severe  . 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 TR (tricuspid regurgitation)  Mild with RA enlargment    Past Surgical History:  Procedure Laterality Date  . A FLUTTER ABLATION N/A 06/27/2011   Procedure: ABLATION A FLUTTER;  Surgeon: Thompson Grayer, MD;  Location: [facility] CATH LAB;  Service: Cardiovascular;  Laterality: N/A;  . ATRIAL ABLATION SURGERY  06/2010  .  HIP SURGERY     Left (fracture) 3/13  . JOINT REPLACEMENT     both knees   . KNEE ARTHROSCOPY     Right  . LUMBAR SPINE SURGERY    . MITRAL VALVE REPAIR Right 01/07/2018   Procedure: MINIMALLY INVASIVE MITRAL VALVE REPAIR using LivaNova ring size 28 MM;  Surgeon: Rexene Alberts, MD;  Location: [place];  Service: Open Heart Surgery;  Laterality: Right;  . PATENT FORAMEN OVALE(PFO) CLOSURE N/A 01/07/2018   Procedure: PATENT FORAMEN OVALE (PFO) CLOSURE;  Surgeon: Rexene Alberts, MD;  Location: [place];  Service: Open Heart Surgery;  Laterality: N/A;  . RIGHT/LEFT HEART CATH AND CORONARY ANGIOGRAPHY N/A 11/11/2017   Procedure: RIGHT/LEFT HEART CATH AND CORONARY ANGIOGRAPHY;  Surgeon: Sherren Mocha, MD;  Location: [place] CV LAB;  Service: Cardiovascular;  Laterality: N/A;  . TEE WITHOUT CARDIOVERSION N/A 09/18/2017   Procedure: TRANSESOPHAGEAL ECHOCARDIOGRAM (TEE);  Surgeon: Skeet Latch, MD;  Location: [place];  Service: Cardiovascular;  Laterality: N/A;  . TEE WITHOUT CARDIOVERSION N/A 01/07/2018   Procedure: TRANSESOPHAGEAL ECHOCARDIOGRAM (TEE);  Surgeon: Rexene Alberts, MD;  Location: [place];  Service: Open Heart Surgery;  Laterality: N/A;  . TOTAL KNEE ARTHROPLASTY     Left  . TUBAL LIGATION       Prior to Admission medications   Medication Sig Start Date End Date Taking? Authorizing Provider  acetaminophen (TYLENOL) 500 MG tablet Take 1,000 mg by mouth every 6 (six) hours as needed for moderate pain or headache.   Yes [provider]  amiodarone (PACERONE) 200 MG tablet Take 1 tablet (200 mg total) by mouth daily. 01/14/18  Yes Lars Pinks M, PA-C  atorvastatin (LIPITOR) 40 MG tablet Take 1 tablet (40 mg total) by mouth at bedtime. 11/03/17  Yes Hassell Done, Mary-Margaret, FNP  ferrous sulfate 325 (65 FE) MG tablet Take 1 tablet (325 mg total) by mouth daily with breakfast. For one month then stop. 01/14/18 01/14/19 Yes Tacy Dura, Donielle M, PA-C  hydrochlorothiazide  (HYDRODIURIL) 25 MG tablet Take 1 tablet (25 mg total) by mouth daily. 01/14/18  Yes Lars Pinks M, PA-C  warfarin (COUMADIN) 2.5 MG tablet Take 1 tablet (2.5 mg total) by mouth daily at 6 PM. Or as directed 01/14/18  Yes Lars Pinks M, PA-C  famotidine (PEPCID) 20 MG tablet Take 1 tablet (20 mg total) by mouth 2 (two) times daily. 07/29/11 10/29/11  Liliane Shi, PA-C    Inpatient Medications: Scheduled Meds: .  stroke: mapping our early stages of recovery book   Does not apply Once  . aspirin  300 mg Rectal Daily  . atorvastatin  40 mg Oral QHS   Continuous Infusions: . sodium chloride 75 mL/hr at 01/24/18 2315   PRN Meds: acetaminophen **OR** acetaminophen, bisacodyl, hydrALAZINE, HYDROcodone-acetaminophen, LORazepam, ondansetron **OR** ondansetron (ZOFRAN) IV, senna-docusate  Allergies:    Allergies  Allergen Reactions  . Oxycodone-Acetaminophen Anxiety and Other (See Comments)    Hallucinations  . Aspirin Nausea And Vomiting    Social History:   Social History   Socioeconomic History  . Marital status: Widowed    Spouse name: Not on file  . Number of children: Not on file  .  Years of education: Not on file  . Highest education level: Not on file  Occupational History  . Not on file  Social Needs  . Financial resource strain: Not on file  . Food insecurity:    Worry: Not on file    Inability: Not on file  . Transportation needs:    Medical: Not on file    Non-medical: Not on file  Tobacco Use  . Smoking status: Never Smoker  . Smokeless tobacco: Never Used  Substance and Sexual Activity  . Alcohol use: No  . Drug use: No  . Sexual activity: Never  Lifestyle  . Physical activity:    Days per week: Not on file    Minutes per session: Not on file  . Stress: Not on file  Relationships  . Social connections:    Talks on phone: Not on file    Gets together: Not on file    Attends religious service: Not on file    Active member of club or  organization: Not on file    Attends meetings of clubs or organizations: Not on file    Relationship status: Not on file  . Intimate partner violence:    Fear of current or ex partner: Not on file    Emotionally abused: Not on file    Physically abused: Not on file    Forced sexual activity: Not on file  Other Topics Concern  . Not on file  Social History Narrative   Lives in [place]   Her daughter lives with her    Family History:   Family History  Problem Relation Age of Onset  . Hypertension Mother   . Cancer Sister        leukemia  . Diabetes Brother   . Stroke Sister   . Diabetes Brother   . Heart attack Brother   . Healthy Daughter   . Healthy Son   . Healthy Daughter    Family Status:  Family Status  Relation Name Status  . Mother  Deceased at age 44       blood clot during hospitalization  . Father  Deceased at age 73       Unknown cause  . Sister  Deceased  . Brother "Gump" Deceased  . Sister  Deceased  . Sister  Alive  . Sister  Alive  . Sister  Alive  . Sister  Alive  . Sister  Alive  . Sister  Alive  . Brother JW Deceased  . Brother  Deceased  . Brother  Alive  . Brother  Alive  . Brother  Alive  . MGM  Deceased  . MGF  Deceased  . PGM  Deceased  . PGF  Deceased  . Daughter  Alive  . Son  Alive  . Daughter  Alive    ROS:  Please see the history of present illness.  All other ROS reviewed and negative.     Physical Exam/Data:   Vitals:   01/25/18 0341 01/25/18 0409 01/25/18 0410 01/25/18 0640  BP: (!) 149/55 (!) 154/49 (!) 158/54   Pulse: (!) 45 (!) 45 (!) 47   Resp: (!) 24 (!) 24 (!) 26 (!) 24  Temp:   98.7 F (37.1 C)   TempSrc:   Oral   SpO2: 99% 99% 100%   Weight:      Height:    5\' 3"  (1.6 m)    Intake/Output Summary (Last 24 hours) at 01/25/2018 1228 Last data filed  at 01/25/2018 0600 Gross per 24 hour  Intake 1258.75 ml  Output 1200 ml  Net 58.75 ml   Filed Weights   01/24/18 1117 01/24/18 2135  Weight: 158 lb (71.7  kg) 156 lb 12 oz (71.1 kg)   Body mass index is 27.77 kg/m.   General: Elderly, ill-appearing, NAD Skin: Warm, dry, intact  Head: Normocephalic, atraumatic, clear, moist mucus membranes. Neck: Negative for carotid bruits. No JVD Lungs:Clear to ausculation bilaterally. No wheezes, rales, or rhonchi. Breathing is unlabored. Cardiovascular: RRR with S1 S2. + murmurs. No rubs or gallops Abdomen: Soft, non-tender, non-distended with normoactive bowel sounds. No obvious abdominal masses. MSK: Unable to assess.  Extremities: No edema. No clubbing or cyanosis. DP/PT pulses 1+ bilaterally Neuro: Unable to assess, unresponsive.  Psych: Does not responds to questions   EKG:  The EKG was personally reviewed and demonstrates: 01/24/2018 atrial fibrillation HR 46 with LBBB  Telemetry:  Telemetry was personally reviewed and demonstrates: 01/25/18 Junctional HR 46  Relevant CV Studies:  ECHO: 01/25/2018:  Study Conclusions  - Left ventricle: Abnormal septal motion Systolic function was   normal. The estimated ejection fraction was in the range of 50%   to 55%. The study is not technically sufficient to allow   evaluation of LV diastolic function. - Aortic valve: Valve area (VTI): 3.31 cm^2. Valve area (Vmax):   3.77 cm^2. Valve area (Vmean): 3.33 cm^2. 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 Trivial   residual MR with somwwhat tight repair and functional MS mean   gradient 7 mmHg peak 20 mmHg. Valve area by continuity equation   (using LVOT flow): 1.63 cm^2. - Left atrium: The atrium was moderately dilated. - Right atrium: The atrium was moderately dilated. - Atrial septum: No defect or patent foramen ovale was identified. - Tricuspid valve: There was mild-moderate regurgitation. - Pulmonary arteries: PA peak pressure: 60 mm Hg (S).  CATH: 11/11/2017:  1.  Widely patent coronary arteries without obstructive CAD 2.  Essentially normal right heart hemodynamics with minimal  elevation of pulmonary artery pressure 3.  Prominent V wave and pulmonary capillary wedge tracing suggestive of hemodynamically significant mitral regurgitation  Laboratory Data:  Chemistry Recent Labs  Lab 01/24/18 1207 01/24/18 1209 01/25/18 0618  NA 139 140 140  K 4.4 4.2 4.2  CL 108 108 111  CO2  --  21* 18*  GLUCOSE 104* 107* 73  BUN 23* 25* 17  CREATININE 1.70* 1.84* 1.47*  CALCIUM  --  8.9 8.6*  GFRNONAA  --  25* 33*  GFRAA  --  29* 39*  ANIONGAP  --  11 11    Total Protein  Date Value Ref Range Status  01/24/2018 7.0 6.5 - 8.1 g/dL Final  11/03/2017 7.1 6.0 - 8.5 g/dL Final   Albumin  Date Value Ref Range Status  01/24/2018 3.3 (L) 3.5 - 5.0 g/dL Final  11/03/2017 4.3 3.5 - 4.8 g/dL Final   AST  Date Value Ref Range Status  01/24/2018 32 15 - 41 U/L Final   ALT  Date Value Ref Range Status  01/24/2018 28 14 - 54 U/L Final   Alkaline Phosphatase  Date Value Ref Range Status  01/24/2018 61 38 - 126 U/L Final   Total Bilirubin  Date Value Ref Range Status  01/24/2018 0.6 0.3 - 1.2 mg/dL Final   Bilirubin Total  Date Value Ref Range Status  11/03/2017 0.5 0.0 - 1.2 mg/dL Final   Hematology Recent Labs  Lab 01/24/18 1207  01/24/18 1209 01/25/18 0618  WBC  --  11.4* 8.0  RBC  --  3.16* 3.44*  HGB 10.2* 10.1* 10.8*  HCT 30.0* 31.0* 34.1*  MCV  --  98.1 99.1  MCH  --  32.0 31.4  MCHC  --  32.6 31.7  RDW  --  14.1 14.2  PLT  --  346 323   Cardiac EnzymesNo results for input(s): TROPONINI in the last 168 hours.  Recent Labs  Lab 01/24/18 1207  TROPIPOC 0.40*    BNPNo results for input(s): BNP, PROBNP in the last 168 hours.  DDimer No results for input(s): DDIMER in the last 168 hours. TSH:  Lab Results  Component Value Date   TSH 1.610 09/03/2017   Lipids: Lab Results  Component Value Date   CHOL 140 01/25/2018   HDL 47 01/25/2018   LDLCALC 75 01/25/2018   TRIG 89 01/25/2018   CHOLHDL 3.0 01/25/2018   HgbA1c: Lab Results    Component Value Date   HGBA1C 5.4 01/25/2018    Radiology/Studies:  Ct Angio Head W Or Wo Contrast  Result Date: 01/24/2018 CLINICAL DATA:  77 y/o  F; acute stroke for follow-up. EXAM: CT ANGIOGRAPHY HEAD AND NECK CT PERFUSION BRAIN TECHNIQUE: Multidetector CT imaging of the head and neck was performed using the standard protocol during bolus administration of intravenous contrast. Multiplanar CT image reconstructions and MIPs were obtained to evaluate the vascular anatomy. Carotid stenosis measurements (when applicable) are obtained utilizing NASCET criteria, using the distal internal carotid diameter as the denominator. Multiphase CT imaging of the brain was performed following IV bolus contrast injection. Subsequent parametric perfusion maps were calculated using RAPID software. CONTRAST:  164mL ISOVUE-370 IOPAMIDOL (ISOVUE-370) INJECTION 76% COMPARISON:  01/24/2018 CT head. FINDINGS: CTA NECK FINDINGS Aortic arch: Bovine variant branching. Imaged portion shows no evidence of aneurysm or dissection. No significant stenosis of the major arch vessel origins. Mild calcific atherosclerosis. Right carotid system: No evidence of dissection, stenosis (50% or greater) or occlusion. Left carotid system: No evidence of dissection, stenosis (50% or greater) or occlusion. Vertebral arteries: Codominant. No evidence of dissection, stenosis (50% or greater) or occlusion. Skeleton: Moderate cervical spondylosis with multilevel disc and facet degenerative changes greatest at the C4-C7 levels. No high-grade bony canal stenosis. Other neck: Negative. Upper chest: Small right pleural effusion. Review of the MIP images confirms the above findings CTA HEAD FINDINGS Anterior circulation: Proximal left M1 occlusion with thrombus extending into the terminus with intermediate left MCA distribution collateralization. The thrombus appears approximately 8 mm in length (series 9, image 89). Mild calcific atherosclerosis of carotid  siphons. Patent right MCA and bilateral ACA distributions with no additional large vessel occlusion, aneurysm, or significant stenosis. Posterior circulation: No significant stenosis, proximal occlusion, aneurysm, or vascular malformation. Venous sinuses: As permitted by contrast timing, patent. Anatomic variants: Right fetal PCA.  Complete circle-of-Willis. Review of the MIP images confirms the above findings CT Brain Perfusion Findings: CBF (<30%) Volume: 69mL Perfusion (Tmax>6.0s) volume: 181mL Mismatch Volume: 23mL Infarction Location:Left MCA distribution. IMPRESSION: 1. Left proximal M1 occlusion with thrombus measuring approximately 8 mm in length. Intermediate collaterals. 2. Left MCA distribution infarction with perfusion CT calculated core of 37 cc corresponding to the infarct on noncontrast CT. The calculated core may slightly underestimate the actual core as the region of infarct visible on noncontrast CT (ASPECTS 4) may have areas of pseudonormalization. 3. Large perfusion penumbra, 78 cc. 4. Patent carotid and vertebral arteries in the neck. No significant stenosis by NASCET  criteria, dissection, or aneurysm. 5. Patent right MCA, bilateral ACA, and bilateral PCA distributions. These results were called by telephone at the time of interpretation on 01/24/2018 at 1:12 pm to Dr. Meryl Crutch , who verbally acknowledged these results. Electronically Signed   By: Kristine Garbe M.D.   On: 01/24/2018 13:25   Ct Head Wo Contrast  Result Date: 01/24/2018 CLINICAL DATA:  Follow up stroke. History of hypertension, hyperlipidemia, neuroendocrine tumor. EXAM: CT HEAD WITHOUT CONTRAST TECHNIQUE: Contiguous axial images were obtained from the base of the skull through the vertex without intravenous contrast. COMPARISON:  CT/CTA HEAD January 24, 2018. FINDINGS: BRAIN: No intraparenchymal hemorrhage, mass effect nor midline shift. Hypodense LEFT basal ganglia, LEFT insular ribbon sign, loss of LEFT  frontotemporal gray-white matter junction. Old LEFT cerebellar infarcts. No parenchymal brain volume loss for age. No hydrocephalus. No abnormal extra-axial fluid collections. Basal cisterns are patent. VASCULAR: Moderate calcific atherosclerosis of the carotid siphons. Dense LEFT insular MCA. SKULL: No skull fracture. Occluded RIGHT facial soft tissue swelling without subcutaneous gas or radiopaque foreign bodies. SINUSES/ORBITS: The mastoid air-cells and included paranasal sinuses are well-aerated.The included ocular globes and orbital contents are non-suspicious. Status post bilateral ocular lens implants. OTHER: None. IMPRESSION: 1. Evolving acute nonhemorrhagic LEFT MCA territory infarct. Dense LEFT MCA consistent with thromboembolism. 2. Old LEFT cerebellar infarct. Electronically Signed   By: Elon Alas M.D.   On: 01/24/2018 23:32   Ct Head Wo Contrast  Result Date: 01/24/2018 CLINICAL DATA:  Altered level of consciousness. History of neuroendocrine tumor EXAM: CT HEAD WITHOUT CONTRAST TECHNIQUE: Contiguous axial images were obtained from the base of the skull through the vertex without intravenous contrast. COMPARISON:  None. FINDINGS: Brain: Ill-defined hypodensity left MCA territory compatible with acute infarct. This involves the frontal and temporal lobe as well as some of the left parietal lobe. There is infarct involving the insula and basal ganglia. No associated hemorrhage. No midline shift. Ventricle size normal. Chronic infarct left cerebellum. Chronic microvascular ischemic change in the white matter. Vascular: Hyperdense distal left internal carotid artery and left MCA compatible with acute thrombosis. Skull: Negative Sinuses/Orbits: Paranasal sinuses clear.  Bilateral cataract surgery Other: None ASPECTS ([place] Stroke Program Early CT Score) - Ganglionic level infarction (caudate, lentiform nuclei, internal capsule, insula, M1-M3 cortex): 2 - Supraganglionic infarction (M4-M6  cortex): 2 Total score (0-10 with 10 being normal): 4 IMPRESSION: 1. Large territory acute left MCA infarct without hemorrhage. Hyperdense left internal carotid artery and left MCA compatible with acute thrombosis. 2. ASPECTS is 4 3. These results were called by telephone at the time of interpretation on 01/24/2018 at 11:50 am to Dr. Virgel Manifold , who verbally acknowledged these results. Electronically Signed   By: Franchot Gallo M.D.   On: 01/24/2018 11:50   Ct Angio Neck W Or Wo Contrast  Result Date: 01/24/2018 CLINICAL DATA:  77 y/o  F; acute stroke for follow-up. EXAM: CT ANGIOGRAPHY HEAD AND NECK CT PERFUSION BRAIN TECHNIQUE: Multidetector CT imaging of the head and neck was performed using the standard protocol during bolus administration of intravenous contrast. Multiplanar CT image reconstructions and MIPs were obtained to evaluate the vascular anatomy. Carotid stenosis measurements (when applicable) are obtained utilizing NASCET criteria, using the distal internal carotid diameter as the denominator. Multiphase CT imaging of the brain was performed following IV bolus contrast injection. Subsequent parametric perfusion maps were calculated using RAPID software. CONTRAST:  192mL ISOVUE-370 IOPAMIDOL (ISOVUE-370) INJECTION 76% COMPARISON:  01/24/2018 CT head. FINDINGS: CTA NECK FINDINGS  Aortic arch: Bovine variant branching. Imaged portion shows no evidence of aneurysm or dissection. No significant stenosis of the major arch vessel origins. Mild calcific atherosclerosis. Right carotid system: No evidence of dissection, stenosis (50% or greater) or occlusion. Left carotid system: No evidence of dissection, stenosis (50% or greater) or occlusion. Vertebral arteries: Codominant. No evidence of dissection, stenosis (50% or greater) or occlusion. Skeleton: Moderate cervical spondylosis with multilevel disc and facet degenerative changes greatest at the C4-C7 levels. No high-grade bony canal stenosis. Other  neck: Negative. Upper chest: Small right pleural effusion. Review of the MIP images confirms the above findings CTA HEAD FINDINGS Anterior circulation: Proximal left M1 occlusion with thrombus extending into the terminus with intermediate left MCA distribution collateralization. The thrombus appears approximately 8 mm in length (series 9, image 89). Mild calcific atherosclerosis of carotid siphons. Patent right MCA and bilateral ACA distributions with no additional large vessel occlusion, aneurysm, or significant stenosis. Posterior circulation: No significant stenosis, proximal occlusion, aneurysm, or vascular malformation. Venous sinuses: As permitted by contrast timing, patent. Anatomic variants: Right fetal PCA.  Complete circle-of-Willis. Review of the MIP images confirms the above findings CT Brain Perfusion Findings: CBF (<30%) Volume: 15mL Perfusion (Tmax>6.0s) volume: 150mL Mismatch Volume: 18mL Infarction Location:Left MCA distribution. IMPRESSION: 1. Left proximal M1 occlusion with thrombus measuring approximately 8 mm in length. Intermediate collaterals. 2. Left MCA distribution infarction with perfusion CT calculated core of 37 cc corresponding to the infarct on noncontrast CT. The calculated core may slightly underestimate the actual core as the region of infarct visible on noncontrast CT (ASPECTS 4) may have areas of pseudonormalization. 3. Large perfusion penumbra, 78 cc. 4. Patent carotid and vertebral arteries in the neck. No significant stenosis by NASCET criteria, dissection, or aneurysm. 5. Patent right MCA, bilateral ACA, and bilateral PCA distributions. These results were called by telephone at the time of interpretation on 01/24/2018 at 1:12 pm to Dr. Meryl Crutch , who verbally acknowledged these results. Electronically Signed   By: Kristine Garbe M.D.   On: 01/24/2018 13:25   Mr Brain Wo Contrast  Result Date: 01/25/2018 CLINICAL DATA:  Follow up stroke. History of hypertension,  hyperlipidemia and neuroendocrine tumor. EXAM: MRI HEAD WITHOUT CONTRAST TECHNIQUE: Multiplanar, multiecho pulse sequences of the brain and surrounding structures were obtained without intravenous contrast. COMPARISON:  CT HEAD January 24, 2018 FINDINGS: INTRACRANIAL CONTENTS: Confluent reduced diffusion LEFT frontoparietal and temporal lobes including insula, reduced diffusion LEFT basal ganglia. Subcentimeter discontinuous reduced diffusion LEFT parietal lobes. Areas of reduced diffusion demonstrate low ADC values, RIGHT FLAIR T2 hyperintense signal. Speckled susceptibility artifact LEFT frontal parietal lobes with additional chronic microhemorrhages bilateral occipital lobes and RIGHT parietal lobe. 2 mm LEFT-to-RIGHT midline shift. Patchy supratentorial white matter FLAIR T2 hyperintensities exclusive of the aforementioned abnormality compatible with mild chronic small vessel ischemic disease. Old LEFT cerebellar infarct. No parenchymal brain volume loss for age. No hydrocephalus. No abnormal extra-axial fluid collections. VASCULAR: Normal major intracranial vascular flow voids present at skull base. SKULL AND UPPER CERVICAL SPINE: No abnormal sellar expansion. No suspicious calvarial bone marrow signal. Craniocervical junction maintained. SINUSES/ORBITS: The mastoid air-cells and included paranasal sinuses are well-aerated.The included ocular globes and orbital contents are non-suspicious. Status post bilateral ocular lens implant. OTHER: Patient is edentulous. IMPRESSION: 1. 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 2 mm LEFT-to-RIGHT midline shift. 2. Susceptibility artifact seen with old PRES, chronic hypertension, less likely amyloid angiopathy. 3. Old LEFT cerebellar infarct. Electronically Signed   By: Elon Alas  M.D.   On: 01/25/2018 06:10   Ct Cerebral Perfusion W Contrast  Result Date: 01/24/2018 CLINICAL DATA:  77 y/o  F; acute stroke for follow-up. EXAM: CT ANGIOGRAPHY HEAD  AND NECK CT PERFUSION BRAIN TECHNIQUE: Multidetector CT imaging of the head and neck was performed using the standard protocol during bolus administration of intravenous contrast. Multiplanar CT image reconstructions and MIPs were obtained to evaluate the vascular anatomy. Carotid stenosis measurements (when applicable) are obtained utilizing NASCET criteria, using the distal internal carotid diameter as the denominator. Multiphase CT imaging of the brain was performed following IV bolus contrast injection. Subsequent parametric perfusion maps were calculated using RAPID software. CONTRAST:  179mL ISOVUE-370 IOPAMIDOL (ISOVUE-370) INJECTION 76% COMPARISON:  01/24/2018 CT head. FINDINGS: CTA NECK FINDINGS Aortic arch: Bovine variant branching. Imaged portion shows no evidence of aneurysm or dissection. No significant stenosis of the major arch vessel origins. Mild calcific atherosclerosis. Right carotid system: No evidence of dissection, stenosis (50% or greater) or occlusion. Left carotid system: No evidence of dissection, stenosis (50% or greater) or occlusion. Vertebral arteries: Codominant. No evidence of dissection, stenosis (50% or greater) or occlusion. Skeleton: Moderate cervical spondylosis with multilevel disc and facet degenerative changes greatest at the C4-C7 levels. No high-grade bony canal stenosis. Other neck: Negative. Upper chest: Small right pleural effusion. Review of the MIP images confirms the above findings CTA HEAD FINDINGS Anterior circulation: Proximal left M1 occlusion with thrombus extending into the terminus with intermediate left MCA distribution collateralization. The thrombus appears approximately 8 mm in length (series 9, image 89). Mild calcific atherosclerosis of carotid siphons. Patent right MCA and bilateral ACA distributions with no additional large vessel occlusion, aneurysm, or significant stenosis. Posterior circulation: No significant stenosis, proximal occlusion, aneurysm,  or vascular malformation. Venous sinuses: As permitted by contrast timing, patent. Anatomic variants: Right fetal PCA.  Complete circle-of-Willis. Review of the MIP images confirms the above findings CT Brain Perfusion Findings: CBF (<30%) Volume: 45mL Perfusion (Tmax>6.0s) volume: 11mL Mismatch Volume: 83mL Infarction Location:Left MCA distribution. IMPRESSION: 1. Left proximal M1 occlusion with thrombus measuring approximately 8 mm in length. Intermediate collaterals. 2. Left MCA distribution infarction with perfusion CT calculated core of 37 cc corresponding to the infarct on noncontrast CT. The calculated core may slightly underestimate the actual core as the region of infarct visible on noncontrast CT (ASPECTS 4) may have areas of pseudonormalization. 3. Large perfusion penumbra, 78 cc. 4. Patent carotid and vertebral arteries in the neck. No significant stenosis by NASCET criteria, dissection, or aneurysm. 5. Patent right MCA, bilateral ACA, and bilateral PCA distributions. These results were called by telephone at the time of interpretation on 01/24/2018 at 1:12 pm to Dr. Meryl Crutch , who verbally acknowledged these results. Electronically Signed   By: Kristine Garbe M.D.   On: 01/24/2018 13:25   Assessment and Plan:   1.  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  Procedure completed on 04/03/2352 without complication.  She was discharged on 01/14/2018.  During her stay, she was started on Coumadin secondary to MVR/history of atrial flutter. On day of discharge, INR was 1.54 on Coumadin 2.5 mg. She was then seen for an anticoagulation visit on 01/18/2018 with an INR of 1.5.  Plans were for her to start taking 1 tablet (2.5 mg) daily except 2 tablets on Mondays, Wednesdays and Fridays. She was to have an INR check on 01/27/2018.  At time of hospital  presentation the patient's INR is subtherapeutic at 1.7. -Anticoagulation placed on hold secondary to acute stroke>>> per neurology note patient has large sized stroke and anticoagulation unless absolutely necessary should be avoided for the next 48 to 72 hours secondary to the increased risk of hemorrhagic transformation. -Continue ASA 325 per neuro, atorvastatin 80 daily>risk factor modification -Home amiodarone on hold>>patient currently in junctional rhythm with HR sustaining in the 40's   2.  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  Patient transferred to [facility] for possible IR intervention which ultimately was denied by patient's family. -MRI completed 01/25/2018 with large left MCA territory infarct with petechial hemorrhage, left to right midline shift, not currently a candidate for decompressive craniectomy given age and comorbidities per neurology -Allow permissive hypertension, only treat as needed if S BP greater than 220 mmHg -ASA 300, Lipitor 80>> risk factor modification  3.  History of atrial flutter: -S/p ablation/radioablation, was on Eliquis therapy prior to admission. Per chart review, pt was seen in the office 08/2017 and was in NSR however, during pre-procedural TEE,  EKG noted atrial flutter with variable conduction and patient was started on Eliquis therapy  -Eliquis stopped approximately 7 days prior to MVR surgery -Coumadin 2.5 mg, INR on presentation 1.7>>she was then seen for an anticoagulation visit on 01/18/2018 with an INR of 1.5.  Plans were for her to start taking 1 tablet (2.5 mg) daily except 2 tablets on Mondays, Wednesdays and Fridays. -Home amiodarone on hold>>patient currently in junctional rhythm with HR sustaining in the 40's   4.  HLD: -Stable, meds currently on hold secondary to dysphasia -Lipid panel,  CHO- 140, HDL-47, LDL-75,Trig-89  5.  Anemia of chronic disease: -Hemoglobin on admission, 10.9   For questions or updates, please contact [place] Please consult [email or web address] for contact info under Cardiology/STEMI.   SignedKathyrn Drown NP-C [facility] Pager: [phone] 01/25/2018 12:28 PM  I have seen, examined and evaluated the patient this PM along with Ms. Glenford Peers, NP-C.  After reviewing all the available data and chart, we discussed the patients laboratory, study & physical findings as well as symptoms in detail. I agree with her findings, examination as well as impression recommendations as per our discussion.    Very unfortunate situation of stroke in the intervening duration from valvular surgery till therapeutic INR in a patient with recent mitral valve repair who has long-standing paroxysmal atrial fibrillation flutter.  We are consult for questions about anticoagulation, but I think this is a question better answered by our neurology colleagues. -- Given the large stroke with midline shift etc., and a coagulation is currently on hold.  Plan is to potentially restart in 2 to 3 days -would probably be best to simply restart without bridging, but would defer to neurology if recommendations are different. For now continue high-dose aspirin. I do not think that TEE is necessary as we have at least 2 potential  cardioembolic foci with the recent mitral valve repair and recurrent paroxysmal A. Fib. --Regardless, the treatment regiment would include antiregulation with warfarin.  Agree with holding amiodarone based on underlying junctional rhythm at this point.   Glenetta Hew, M.D., M.S. Interventional Cardiologist   Pager # [phone] Phone # [phone] [address] [place], [place] [address]

## 2018-01-25 NOTE — Progress Notes (Signed)
   01/25/18 0900  SLP Visit Information  SLP Received On 01/25/18  General Information  HPI Brooke Chavez is a 77 y.o. female past medical history of atrial flutter, mitral regurgitation status post mitral valve repair 2 weeks ago, bradycardia, hyperlipidemia, who was in her usual state of health last known normal 10 PM 01/23/2018, noted to be weak on the right side and aphasic. MRI shows Large LEFT MCA territory infarct with petechial hemorrhage. 2 mm LEFT-to-RIGHT midline shift.    Type of Study Bedside Swallow Evaluation  Previous Swallow Assessment none  Diet Prior to this Study NPO  Temperature Spikes Noted No  Respiratory Status Room air  History of Recent Intubation No  Behavior/Cognition Alert;Cooperative;Pleasant mood  Oral Cavity Assessment WFL  Oral Care Completed by SLP No  Oral Cavity - Dentition Dentures, not available;Missing dentition  Vision Functional for self-feeding  Self-Feeding Abilities Needs assist  Patient Positioning Upright in bed  Baseline Vocal Quality Normal  Volitional Cough Cognitively unable to elicit  Volitional Swallow Unable to elicit  Ice Chips  Ice chips WFL  Thin Liquid  Thin Liquid Impaired  Presentation Cup;Straw;Self Fed  Oral Phase Impairments Reduced labial seal  Oral Phase Functional Implications Right anterior spillage  Nectar Thick Liquid  Nectar Thick Liquid NT  Honey Thick Liquid  Honey Thick Liquid NT  Puree  Puree Impaired  Oral Phase Impairments Reduced labial seal  Oral Phase Functional Implications Right anterior spillage  Solid  Solid Impaired  Presentation Self Fed  Oral Phase Impairments Impaired mastication (missing dentition)  Oral Phase Functional Implications Impaired mastication  SLP - End of Session  Patient left in bed  SLP Assessment  Clinical Impression Statement (ACUTE ONLY) Pt demonstrates a mild oral dysphagia secondary to right CN VII weakness and sensory impariment. Lingual function appears WNL and oral  manipulation of solids is adequate except that pts dentures are not present today. No signs of aspriration with over 8 oz of thin liquids. Pt able to sip via cup and straw with only minimal anterior spillage. Will initiate a dys 3 mech soft diet and thin liquids and f/u for tolerance. May be able to advance to regular once dentures are present.   SLP Visit Diagnosis Dysphagia, oral phase (R13.11)  Impact on safety and function Mild aspiration risk  Swallow Evaluation Recommendations  SLP Diet Recommendations Dysphagia 3 (Mech soft);Thin liquid  Liquid Administration via Cup;Straw  Medication Administration Whole meds with liquid  Supervision Staff to assist with self feeding;Full supervision/cueing for compensatory strategies  Compensations Slow rate;Small sips/bites  Postural Changes Seated upright at 90 degrees  Treatment Plan  Oral Care Recommendations Oral care BID  Treatment Recommendations Therapy as outlined in treatment plan below  Follow up Recommendations Inpatient Rehab  Speech Therapy Frequency (ACUTE ONLY) min 2x/week  Treatment Duration 2 weeks  Interventions Patient/family education;Compensatory techniques  Prognosis  Prognosis for Safe Diet Advancement Good  Individuals Consulted  Consulted and Agree with Results and Recommendations Patient;Family member/caregiver  Family Member Consulted daughter son  Progression Toward Goals  Progression toward goals Progressing toward goals  SLP Time Calculation  SLP Start Time (ACUTE ONLY) 0845  SLP Stop Time (ACUTE ONLY) 0940  SLP Time Calculation (min) (ACUTE ONLY) 55 min  SLP Evaluations  $ SLP Speech Visit 1 Visit  SLP Evaluations  $BSS Swallow 1 Procedure  $Swallowing Treatment 1 Procedure  Herbie Baltimore, MA CCC-SLP [phone]

## 2018-01-26 DIAGNOSIS — K869 Disease of pancreas, unspecified: Secondary | ICD-10-CM

## 2018-01-26 DIAGNOSIS — Z515 Encounter for palliative care: Secondary | ICD-10-CM

## 2018-01-26 DIAGNOSIS — G8191 Hemiplegia, unspecified affecting right dominant side: Secondary | ICD-10-CM

## 2018-01-26 DIAGNOSIS — Z7189 Other specified counseling: Secondary | ICD-10-CM

## 2018-01-26 MED ORDER — ASPIRIN EC 81 MG PO TBEC
81.0000 mg | DELAYED_RELEASE_TABLET | Freq: Every day | ORAL | Status: DC
  Administered 2018-01-27: 81 mg via ORAL
  Filled 2018-01-26: qty 1

## 2018-01-26 NOTE — Consult Note (Signed)
Physical Medicine and Rehabilitation Consult Reason for Consult: Right side weakness Referring Physician: [facility]   HPI: Brooke Chavez is a 77 y.o. right-handed female with complex medical history of mitral valve disease status post mitral valve repair 01/07/2018 per Dr. Roxy Manns and was discharged on Coumadin on 01/14/2018, hypertension, hyperlipidemia.  Presented to [facility] 01/24/2018 after being found down by family with right sided weakness and aphasia.  Per chart review patient lives with daughter.  Patient had been independent prior to admission without need for assistive device.  Cranial CT scan showed large territory acute left MCA infarction without hemorrhage.  Hyperdense left internal carotid artery left MCA compatible with acute thrombosis.  Patient was discharged to [facility] for ongoing care.  INR on admission of 1.7.  CT angiogram of head and neck showed left proximal M1 occlusion with thrombus measuring approximately 8 mm in length.  Large perfusion penumbra 78 cc.  Echocardiogram with ejection fraction of 55%.  Systolic function was normal.  MRI of the brain 01/25/2017 showed large left MCA territory infarct with petechial hemorrhage.  2 mm left to right midline shift.  Old left cerebellar infarction.  Cardiology follow-up no current plan for TEE.  Coumadin currently on hold due to large stroke and await plan to resume anticoagulation.  Mechanical soft diet with thin liquids.  Physical and occupational therapy evaluations completed with recommendations of physical medicine rehab consult.  Review of Systems  Unable to perform ROS: Language   Past Medical History:  Diagnosis Date  . Atrial flutter ([place])    Typical by EKG diagnosis 9/11 s/p CIT ablation 11/11  . Bradycardia   . Chronic diastolic congestive heart failure ([place])   . DJD (degenerative joint disease)   . Dysrhythmia   . Femur fracture, left ([place]) 2013  . Heart murmur   . Hyperlipidemia    x5 years    . Hypertension    Since 1997  . Liver masses 11/13/2017   Multiple small nodules seen on CT and MRI  . Mitral regurgitation    severe  . 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 TR (tricuspid regurgitation)    Mild with RA enlargment   Past Surgical History:  Procedure Laterality Date  . A FLUTTER ABLATION N/A 06/27/2011   Procedure: ABLATION A FLUTTER;  Surgeon: Thompson Grayer, MD;  Location: [facility] CATH LAB;  Service: Cardiovascular;  Laterality: N/A;  . ATRIAL ABLATION SURGERY  06/2010  . HIP SURGERY     Left (fracture) 3/13  . JOINT REPLACEMENT     both knees   . KNEE ARTHROSCOPY     Right  . LUMBAR SPINE SURGERY    . MITRAL VALVE REPAIR Right 01/07/2018   Procedure: MINIMALLY INVASIVE MITRAL VALVE REPAIR using LivaNova ring size 28 MM;  Surgeon: Rexene Alberts, MD;  Location: [place];  Service: Open Heart Surgery;  Laterality: Right;  . PATENT FORAMEN OVALE(PFO) CLOSURE N/A 01/07/2018   Procedure: PATENT FORAMEN OVALE (PFO) CLOSURE;  Surgeon: Rexene Alberts, MD;  Location: [place];  Service: Open Heart Surgery;  Laterality: N/A;  . RIGHT/LEFT HEART CATH AND CORONARY ANGIOGRAPHY N/A 11/11/2017   Procedure: RIGHT/LEFT HEART CATH AND CORONARY ANGIOGRAPHY;  Surgeon: Sherren Mocha, MD;  Location: [place] CV LAB;  Service: Cardiovascular;  Laterality:  N/A;  . TEE WITHOUT CARDIOVERSION N/A 09/18/2017   Procedure: TRANSESOPHAGEAL ECHOCARDIOGRAM (TEE);  Surgeon: Skeet Latch, MD;  Location: [place];  Service: Cardiovascular;  Laterality: N/A;  . TEE WITHOUT CARDIOVERSION N/A 01/07/2018   Procedure: TRANSESOPHAGEAL ECHOCARDIOGRAM (TEE);  Surgeon: Rexene Alberts, MD;  Location: [place];  Service: Open Heart Surgery;  Laterality: N/A;  . TOTAL KNEE ARTHROPLASTY     Left  . TUBAL LIGATION      Family History  Problem Relation Age of Onset  . Hypertension Mother   . Cancer Sister        leukemia  . Diabetes Brother   . Stroke Sister   . Diabetes Brother   . Heart attack Brother   . Healthy Daughter   . Healthy Son   . Healthy Daughter    Social History:  reports that she has never smoked. She has never used smokeless tobacco. She reports that she does not drink alcohol or use drugs. Allergies:  Allergies  Allergen Reactions  . Oxycodone-Acetaminophen Anxiety and Other (See Comments)    Hallucinations  . Aspirin Nausea And Vomiting   Medications Prior to Admission  Medication Sig Dispense Refill  . acetaminophen (TYLENOL) 500 MG tablet Take 1,000 mg by mouth every 6 (six) hours as needed for moderate pain or headache.    Marland Kitchen amiodarone (PACERONE) 200 MG tablet Take 1 tablet (200 mg total) by mouth daily. 30 tablet 1  . atorvastatin (LIPITOR) 40 MG tablet Take 1 tablet (40 mg total) by mouth at bedtime. 90 tablet 1  . ferrous sulfate 325 (65 FE) MG tablet Take 1 tablet (325 mg total) by mouth daily with breakfast. For one month then stop. 30 tablet 3  . hydrochlorothiazide (HYDRODIURIL) 25 MG tablet Take 1 tablet (25 mg total) by mouth daily. 30 tablet 1  . warfarin (COUMADIN) 2.5 MG tablet Take 1 tablet (2.5 mg total) by mouth daily at 6 PM. Or as directed 30 tablet 0    Home: Home Living Family/patient expects to be discharged to:: Private residence Living Arrangements: Children, Other relatives Available Help at Discharge: Family, Available 24 hours/day Type of Home: Mobile home Home Access: Stairs to enter Entrance Stairs-Number of Steps: 3 Entrance Stairs-Rails: None Home Layout: One level Bathroom Shower/Tub: Chiropodist: Standard Home Equipment: None  Functional History: Prior Function Level of Independence: Independent Comments: independent up until cardiac surgery (~2 weeks ago) Functional Status:  Mobility: Bed Mobility Overal  bed mobility: Needs Assistance Bed Mobility: Rolling, Supine to Sit Rolling: Max assist Supine to sit: +2 for safety/equipment, Mod assist General bed mobility comments: verbal cuing and manual facilitation for sequence and hand placement. Assistance for R LE and trunk Transfers Overall transfer level: Needs assistance Equipment used: None Transfers: Sit to/from Stand, Stand Pivot Transfers Sit to Stand: Mod assist, +2 physical assistance Stand pivot transfers: Mod assist, +2 physical assistance General transfer comment: Mod A + 2 due to R LE weakness and reduced intentional motion. Does attempt to weight shift and slide/shuffle R LE      ADL: ADL Overall ADL's : Needs assistance/impaired Grooming: Wash/dry hands, Wash/dry face, Oral care, Minimal assistance, Supervision/safety, Set up, Sitting Upper Body Bathing: Cueing for sequencing, Maximal assistance Lower Body Bathing: Total assistance Upper Body Dressing : Maximal assistance Lower Body Dressing: Total assistance Toilet Transfer: +2 for safety/equipment Toileting- Clothing Manipulation and Hygiene: Total assistance Functional mobility during ADLs: Moderate assistance, +2 for physical assistance  Cognition: Cognition Overall Cognitive Status: Impaired/Different  from baseline Arousal/Alertness: Awake/alert Orientation Level: Oriented to person, Oriented to place, Disoriented to time, Disoriented to situation Attention: Sustained, Selective Sustained Attention: Appears intact Selective Attention: Appears intact Memory: (UTA) Awareness: Impaired Awareness Impairment: Intellectual impairment, Emergent impairment Problem Solving: Impaired Problem Solving Impairment: Verbal basic, Functional basic Cognition Arousal/Alertness: Awake/alert Overall Cognitive Status: Impaired/Different from baseline Area of Impairment: Orientation, Attention, Following commands, Safety/judgement, Problem solving Orientation Level: Disoriented  to, Place, Time, Situation Current Attention Level: Focused Following Commands: Follows one step commands inconsistently, Follows one step commands with increased time Safety/Judgement: Decreased awareness of safety, Decreased awareness of deficits Problem Solving: Slow processing, Decreased initiation, Difficulty sequencing, Requires verbal cues, Requires tactile cues  Blood pressure (!) 143/83, pulse 74, temperature 99.6 F (37.6 C), temperature source Oral, resp. rate (!) 27, height 5\' 3"  (1.6 m), weight 76.5 kg (168 lb 10.4 oz), last menstrual period 11/05/1992, SpO2 95 %. Physical Exam  Constitutional: She appears well-developed.  HENT:  Head: Normocephalic.  Missing teeth, numerous fillings. Abrasion right face  Eyes: Pupils are equal, round, and reactive to light. Conjunctivae are normal.  Neck: Normal range of motion.  Cardiovascular: Normal rate.  Respiratory: Effort normal.  GI: Soft.  Musculoskeletal: She exhibits no edema.  Neurological: She is alert.  Right central 7 and tongue deviation. Non-verbal except for occasional garbled speech. RUE without movement except with pain provocation. RLE the same. Motor planning deficits/apraxia. Senses pain in all 4 limbs. Moves LUE and LLE spontaneously  Psychiatric:  Pleasant, smiling    Results for orders placed or performed during the hospital encounter of 01/24/18 (from the past 24 hour(s))  Hemoglobin A1c     Status: None   Collection Time: 01/25/18  6:18 AM  Result Value Ref Range   Hgb A1c MFr Bld 5.4 4.8 - 5.6 %   Mean Plasma Glucose 108.28 mg/dL  Lipid panel     Status: None   Collection Time: 01/25/18  6:18 AM  Result Value Ref Range   Cholesterol 140 0 - 200 mg/dL   Triglycerides 89 <150 mg/dL   HDL 47 >40 mg/dL   Total CHOL/HDL Ratio 3.0 RATIO   VLDL 18 0 - 40 mg/dL   LDL Cholesterol 75 0 - 99 mg/dL  Basic metabolic panel     Status: Abnormal   Collection Time: 01/25/18  6:18 AM  Result Value Ref Range   Sodium  140 135 - 145 mmol/L   Potassium 4.2 3.5 - 5.1 mmol/L   Chloride 111 101 - 111 mmol/L   CO2 18 (L) 22 - 32 mmol/L   Glucose, Bld 73 65 - 99 mg/dL   BUN 17 6 - 20 mg/dL   Creatinine, Ser 1.47 (H) 0.44 - 1.00 mg/dL   Calcium 8.6 (L) 8.9 - 10.3 mg/dL   GFR calc non Af Amer 33 (L) >60 mL/min   GFR calc Af Amer 39 (L) >60 mL/min   Anion gap 11 5 - 15  CBC     Status: Abnormal   Collection Time: 01/25/18  6:18 AM  Result Value Ref Range   WBC 8.0 4.0 - 10.5 K/uL   RBC 3.44 (L) 3.87 - 5.11 MIL/uL   Hemoglobin 10.8 (L) 12.0 - 15.0 g/dL   HCT 34.1 (L) 36.0 - 46.0 %   MCV 99.1 78.0 - 100.0 fL   MCH 31.4 26.0 - 34.0 pg   MCHC 31.7 30.0 - 36.0 g/dL   RDW 14.2 11.5 - 15.5 %   Platelets 323 150 -  400 K/uL   Ct Angio Head W Or Wo Contrast  Result Date: 01/24/2018 CLINICAL DATA:  77 y/o  F; acute stroke for follow-up. EXAM: CT ANGIOGRAPHY HEAD AND NECK CT PERFUSION BRAIN TECHNIQUE: Multidetector CT imaging of the head and neck was performed using the standard protocol during bolus administration of intravenous contrast. Multiplanar CT image reconstructions and MIPs were obtained to evaluate the vascular anatomy. Carotid stenosis measurements (when applicable) are obtained utilizing NASCET criteria, using the distal internal carotid diameter as the denominator. Multiphase CT imaging of the brain was performed following IV bolus contrast injection. Subsequent parametric perfusion maps were calculated using RAPID software. CONTRAST:  132mL ISOVUE-370 IOPAMIDOL (ISOVUE-370) INJECTION 76% COMPARISON:  01/24/2018 CT head. FINDINGS: CTA NECK FINDINGS Aortic arch: Bovine variant branching. Imaged portion shows no evidence of aneurysm or dissection. No significant stenosis of the major arch vessel origins. Mild calcific atherosclerosis. Right carotid system: No evidence of dissection, stenosis (50% or greater) or occlusion. Left carotid system: No evidence of dissection, stenosis (50% or greater) or occlusion.  Vertebral arteries: Codominant. No evidence of dissection, stenosis (50% or greater) or occlusion. Skeleton: Moderate cervical spondylosis with multilevel disc and facet degenerative changes greatest at the C4-C7 levels. No high-grade bony canal stenosis. Other neck: Negative. Upper chest: Small right pleural effusion. Review of the MIP images confirms the above findings CTA HEAD FINDINGS Anterior circulation: Proximal left M1 occlusion with thrombus extending into the terminus with intermediate left MCA distribution collateralization. The thrombus appears approximately 8 mm in length (series 9, image 89). Mild calcific atherosclerosis of carotid siphons. Patent right MCA and bilateral ACA distributions with no additional large vessel occlusion, aneurysm, or significant stenosis. Posterior circulation: No significant stenosis, proximal occlusion, aneurysm, or vascular malformation. Venous sinuses: As permitted by contrast timing, patent. Anatomic variants: Right fetal PCA.  Complete circle-of-Willis. Review of the MIP images confirms the above findings CT Brain Perfusion Findings: CBF (<30%) Volume: 58mL Perfusion (Tmax>6.0s) volume: 119mL Mismatch Volume: 51mL Infarction Location:Left MCA distribution. IMPRESSION: 1. Left proximal M1 occlusion with thrombus measuring approximately 8 mm in length. Intermediate collaterals. 2. Left MCA distribution infarction with perfusion CT calculated core of 37 cc corresponding to the infarct on noncontrast CT. The calculated core may slightly underestimate the actual core as the region of infarct visible on noncontrast CT (ASPECTS 4) may have areas of pseudonormalization. 3. Large perfusion penumbra, 78 cc. 4. Patent carotid and vertebral arteries in the neck. No significant stenosis by NASCET criteria, dissection, or aneurysm. 5. Patent right MCA, bilateral ACA, and bilateral PCA distributions. These results were called by telephone at the time of interpretation on 01/24/2018 at  1:12 pm to Dr. Meryl Crutch , who verbally acknowledged these results. Electronically Signed   By: Kristine Garbe M.D.   On: 01/24/2018 13:25   Ct Head Wo Contrast  Result Date: 01/24/2018 CLINICAL DATA:  Follow up stroke. History of hypertension, hyperlipidemia, neuroendocrine tumor. EXAM: CT HEAD WITHOUT CONTRAST TECHNIQUE: Contiguous axial images were obtained from the base of the skull through the vertex without intravenous contrast. COMPARISON:  CT/CTA HEAD January 24, 2018. FINDINGS: BRAIN: No intraparenchymal hemorrhage, mass effect nor midline shift. Hypodense LEFT basal ganglia, LEFT insular ribbon sign, loss of LEFT frontotemporal gray-white matter junction. Old LEFT cerebellar infarcts. No parenchymal brain volume loss for age. No hydrocephalus. No abnormal extra-axial fluid collections. Basal cisterns are patent. VASCULAR: Moderate calcific atherosclerosis of the carotid siphons. Dense LEFT insular MCA. SKULL: No skull fracture. Occluded RIGHT facial soft tissue swelling  without subcutaneous gas or radiopaque foreign bodies. SINUSES/ORBITS: The mastoid air-cells and included paranasal sinuses are well-aerated.The included ocular globes and orbital contents are non-suspicious. Status post bilateral ocular lens implants. OTHER: None. IMPRESSION: 1. Evolving acute nonhemorrhagic LEFT MCA territory infarct. Dense LEFT MCA consistent with thromboembolism. 2. Old LEFT cerebellar infarct. Electronically Signed   By: Elon Alas M.D.   On: 01/24/2018 23:32   Ct Head Wo Contrast  Result Date: 01/24/2018 CLINICAL DATA:  Altered level of consciousness. History of neuroendocrine tumor EXAM: CT HEAD WITHOUT CONTRAST TECHNIQUE: Contiguous axial images were obtained from the base of the skull through the vertex without intravenous contrast. COMPARISON:  None. FINDINGS: Brain: Ill-defined hypodensity left MCA territory compatible with acute infarct. This involves the frontal and temporal lobe as well  as some of the left parietal lobe. There is infarct involving the insula and basal ganglia. No associated hemorrhage. No midline shift. Ventricle size normal. Chronic infarct left cerebellum. Chronic microvascular ischemic change in the white matter. Vascular: Hyperdense distal left internal carotid artery and left MCA compatible with acute thrombosis. Skull: Negative Sinuses/Orbits: Paranasal sinuses clear.  Bilateral cataract surgery Other: None ASPECTS ([place] Stroke Program Early CT Score) - Ganglionic level infarction (caudate, lentiform nuclei, internal capsule, insula, M1-M3 cortex): 2 - Supraganglionic infarction (M4-M6 cortex): 2 Total score (0-10 with 10 being normal): 4 IMPRESSION: 1. Large territory acute left MCA infarct without hemorrhage. Hyperdense left internal carotid artery and left MCA compatible with acute thrombosis. 2. ASPECTS is 4 3. These results were called by telephone at the time of interpretation on 01/24/2018 at 11:50 am to Dr. Virgel Manifold , who verbally acknowledged these results. Electronically Signed   By: Franchot Gallo M.D.   On: 01/24/2018 11:50   Ct Angio Neck W Or Wo Contrast  Result Date: 01/24/2018 CLINICAL DATA:  77 y/o  F; acute stroke for follow-up. EXAM: CT ANGIOGRAPHY HEAD AND NECK CT PERFUSION BRAIN TECHNIQUE: Multidetector CT imaging of the head and neck was performed using the standard protocol during bolus administration of intravenous contrast. Multiplanar CT image reconstructions and MIPs were obtained to evaluate the vascular anatomy. Carotid stenosis measurements (when applicable) are obtained utilizing NASCET criteria, using the distal internal carotid diameter as the denominator. Multiphase CT imaging of the brain was performed following IV bolus contrast injection. Subsequent parametric perfusion maps were calculated using RAPID software. CONTRAST:  115mL ISOVUE-370 IOPAMIDOL (ISOVUE-370) INJECTION 76% COMPARISON:  01/24/2018 CT head. FINDINGS: CTA NECK  FINDINGS Aortic arch: Bovine variant branching. Imaged portion shows no evidence of aneurysm or dissection. No significant stenosis of the major arch vessel origins. Mild calcific atherosclerosis. Right carotid system: No evidence of dissection, stenosis (50% or greater) or occlusion. Left carotid system: No evidence of dissection, stenosis (50% or greater) or occlusion. Vertebral arteries: Codominant. No evidence of dissection, stenosis (50% or greater) or occlusion. Skeleton: Moderate cervical spondylosis with multilevel disc and facet degenerative changes greatest at the C4-C7 levels. No high-grade bony canal stenosis. Other neck: Negative. Upper chest: Small right pleural effusion. Review of the MIP images confirms the above findings CTA HEAD FINDINGS Anterior circulation: Proximal left M1 occlusion with thrombus extending into the terminus with intermediate left MCA distribution collateralization. The thrombus appears approximately 8 mm in length (series 9, image 89). Mild calcific atherosclerosis of carotid siphons. Patent right MCA and bilateral ACA distributions with no additional large vessel occlusion, aneurysm, or significant stenosis. Posterior circulation: No significant stenosis, proximal occlusion, aneurysm, or vascular malformation. Venous sinuses: As permitted  by contrast timing, patent. Anatomic variants: Right fetal PCA.  Complete circle-of-Willis. Review of the MIP images confirms the above findings CT Brain Perfusion Findings: CBF (<30%) Volume: 18mL Perfusion (Tmax>6.0s) volume: 149mL Mismatch Volume: 36mL Infarction Location:Left MCA distribution. IMPRESSION: 1. Left proximal M1 occlusion with thrombus measuring approximately 8 mm in length. Intermediate collaterals. 2. Left MCA distribution infarction with perfusion CT calculated core of 37 cc corresponding to the infarct on noncontrast CT. The calculated core may slightly underestimate the actual core as the region of infarct visible on  noncontrast CT (ASPECTS 4) may have areas of pseudonormalization. 3. Large perfusion penumbra, 78 cc. 4. Patent carotid and vertebral arteries in the neck. No significant stenosis by NASCET criteria, dissection, or aneurysm. 5. Patent right MCA, bilateral ACA, and bilateral PCA distributions. These results were called by telephone at the time of interpretation on 01/24/2018 at 1:12 pm to Dr. Meryl Crutch , who verbally acknowledged these results. Electronically Signed   By: Kristine Garbe M.D.   On: 01/24/2018 13:25   Mr Brain Wo Contrast  Result Date: 01/25/2018 CLINICAL DATA:  Follow up stroke. History of hypertension, hyperlipidemia and neuroendocrine tumor. EXAM: MRI HEAD WITHOUT CONTRAST TECHNIQUE: Multiplanar, multiecho pulse sequences of the brain and surrounding structures were obtained without intravenous contrast. COMPARISON:  CT HEAD January 24, 2018 FINDINGS: INTRACRANIAL CONTENTS: Confluent reduced diffusion LEFT frontoparietal and temporal lobes including insula, reduced diffusion LEFT basal ganglia. Subcentimeter discontinuous reduced diffusion LEFT parietal lobes. Areas of reduced diffusion demonstrate low ADC values, RIGHT FLAIR T2 hyperintense signal. Speckled susceptibility artifact LEFT frontal parietal lobes with additional chronic microhemorrhages bilateral occipital lobes and RIGHT parietal lobe. 2 mm LEFT-to-RIGHT midline shift. Patchy supratentorial white matter FLAIR T2 hyperintensities exclusive of the aforementioned abnormality compatible with mild chronic small vessel ischemic disease. Old LEFT cerebellar infarct. No parenchymal brain volume loss for age. No hydrocephalus. No abnormal extra-axial fluid collections. VASCULAR: Normal major intracranial vascular flow voids present at skull base. SKULL AND UPPER CERVICAL SPINE: No abnormal sellar expansion. No suspicious calvarial bone marrow signal. Craniocervical junction maintained. SINUSES/ORBITS: The mastoid air-cells and  included paranasal sinuses are well-aerated.The included ocular globes and orbital contents are non-suspicious. Status post bilateral ocular lens implant. OTHER: Patient is edentulous. IMPRESSION: 1. 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 2 mm LEFT-to-RIGHT midline shift. 2. Susceptibility artifact seen with old PRES, chronic hypertension, less likely amyloid angiopathy. 3. Old LEFT cerebellar infarct. Electronically Signed   By: Elon Alas M.D.   On: 01/25/2018 06:10   Ct Cerebral Perfusion W Contrast  Result Date: 01/24/2018 CLINICAL DATA:  77 y/o  F; acute stroke for follow-up. EXAM: CT ANGIOGRAPHY HEAD AND NECK CT PERFUSION BRAIN TECHNIQUE: Multidetector CT imaging of the head and neck was performed using the standard protocol during bolus administration of intravenous contrast. Multiplanar CT image reconstructions and MIPs were obtained to evaluate the vascular anatomy. Carotid stenosis measurements (when applicable) are obtained utilizing NASCET criteria, using the distal internal carotid diameter as the denominator. Multiphase CT imaging of the brain was performed following IV bolus contrast injection. Subsequent parametric perfusion maps were calculated using RAPID software. CONTRAST:  170mL ISOVUE-370 IOPAMIDOL (ISOVUE-370) INJECTION 76% COMPARISON:  01/24/2018 CT head. FINDINGS: CTA NECK FINDINGS Aortic arch: Bovine variant branching. Imaged portion shows no evidence of aneurysm or dissection. No significant stenosis of the major arch vessel origins. Mild calcific atherosclerosis. Right carotid system: No evidence of dissection, stenosis (50% or greater) or occlusion. Left carotid system: No evidence of dissection,  stenosis (50% or greater) or occlusion. Vertebral arteries: Codominant. No evidence of dissection, stenosis (50% or greater) or occlusion. Skeleton: Moderate cervical spondylosis with multilevel disc and facet degenerative changes greatest at the C4-C7 levels. No  high-grade bony canal stenosis. Other neck: Negative. Upper chest: Small right pleural effusion. Review of the MIP images confirms the above findings CTA HEAD FINDINGS Anterior circulation: Proximal left M1 occlusion with thrombus extending into the terminus with intermediate left MCA distribution collateralization. The thrombus appears approximately 8 mm in length (series 9, image 89). Mild calcific atherosclerosis of carotid siphons. Patent right MCA and bilateral ACA distributions with no additional large vessel occlusion, aneurysm, or significant stenosis. Posterior circulation: No significant stenosis, proximal occlusion, aneurysm, or vascular malformation. Venous sinuses: As permitted by contrast timing, patent. Anatomic variants: Right fetal PCA.  Complete circle[facility]. Review of the MIP images confirms the above findings CT Brain Perfusion Findings: CBF (<30%) Volume: 69mL Perfusion (Tmax>6.0s) volume: 187mL Mismatch Volume: 77mL Infarction Location:Left MCA distribution. IMPRESSION: 1. Left proximal M1 occlusion with thrombus measuring approximately 8 mm in length. Intermediate collaterals. 2. Left MCA distribution infarction with perfusion CT calculated core of 37 cc corresponding to the infarct on noncontrast CT. The calculated core may slightly underestimate the actual core as the region of infarct visible on noncontrast CT (ASPECTS 4) may have areas of pseudonormalization. 3. Large perfusion penumbra, 78 cc. 4. Patent carotid and vertebral arteries in the neck. No significant stenosis by NASCET criteria, dissection, or aneurysm. 5. Patent right MCA, bilateral ACA, and bilateral PCA distributions. These results were called by telephone at the time of interpretation on 01/24/2018 at 1:12 pm to Dr. Meryl Crutch , who verbally acknowledged these results. Electronically Signed   By: Kristine Garbe M.D.   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 Does the need for close, 24 hr/day medical supervision in concert with the patient's rehab needs make it unreasonable for this patient to be served in a less intensive setting? Yes 2. Co-Morbidities requiring supervision/potential complications: aflutter, chf, htn, post-stroke sequelae 3. Due to bladder management, bowel management, safety, skin/wound care, disease management, medication administration and patient education, does the patient require 24 hr/day rehab nursing? Yes 4. Does the patient require coordinated care of a physician, rehab nurse, PT (1-2 hrs/day, 5 days/week), OT (1-2 hrs/day, 5 days/week) and SLP (1-2 hrs/day, 5 days/week) to address physical and functional deficits in the context of the above medical diagnosis(es)? Yes Addressing deficits in the following areas: balance, endurance, locomotion, strength, transferring, bowel/bladder control, bathing, dressing, feeding, grooming, toileting, cognition, speech, language, swallowing and psychosocial support 5. Can the patient actively participate in an intensive therapy program of at least 3 hrs of therapy per day at least 5 days per week? Yes 6. The potential for patient to make measurable gains while on inpatient rehab is excellent 7. Anticipated functional outcomes upon discharge from inpatient rehab are supervision and min assist  with PT, supervision and min assist with OT, supervision, min assist and mod assist with SLP. 8. Estimated rehab length of stay to reach the above functional goals is: 20-25 days 9. Anticipated D/C setting: Home 10. Anticipated post D/C treatments: HH therapy and Outpatient therapy 11. Overall Rehab/Functional Prognosis: excellent  RECOMMENDATIONS: This patient's condition is appropriate for continued rehabilitative care in the following setting: CIR Patient has agreed to participate in recommended program. Yes Note that insurance prior authorization may be  required for  reimbursement for recommended care.  Comment: Rehab Admissions Coordinator to follow up.  Thanks,  Meredith Staggers, MD, Mellody Drown  I have personally performed a face to face diagnostic evaluation of this patient. Additionally, I have reviewed and concur with the physician assistant's documentation above.     Lavon Paganini Angiulli, PA-C 01/26/2018

## 2018-01-26 NOTE — Progress Notes (Signed)
[address]       [place] [address]             [phone]     CARDIOTHORACIC SURGERY PROGRESS NOTE  Subjective: Patient is awake and alert.  Denies pain or SOB.  Reportedly eating well.  Dense aphasia and right hemiparesis.  Objective: Vital signs in last 24 hours: Temp:  [98.3 F (36.8 C)-99.9 F (37.7 C)] 98.4 F (36.9 C) (06/18 1600) Pulse Rate:  [43-78] 43 (06/18 1600) Cardiac Rhythm: Junctional rhythm;Bundle branch block (06/18 0830) Resp:  [17-27] 21 (06/18 1402) BP: (127-157)/(55-83) 157/76 (06/18 1600) SpO2:  [91 %-100 %] 100 % (06/18 1600) Weight:  [168 lb 10.4 oz (76.5 kg)] 168 lb 10.4 oz (76.5 kg) (06/18 0003)  Physical Exam:  Rhythm:   Junctional vs ectopic atrial rhythm  Breath sounds: clear  Heart sounds:  RRR w/out murmur  Incisions:  Clean and dry  Abdomen:  Soft, non-distended, non-tender  Extremities:  Warm, well-perfused    Intake/Output from previous day: 06/17 0701 - 06/18 0700 In: 1393.8 [I.V.:1393.8] Out: 925 [Urine:925] Intake/Output this shift: No intake/output data recorded.  Lab Results: Recent Labs    01/24/18 1209 01/25/18 0618  WBC 11.4* 8.0  HGB 10.1* 10.8*  HCT 31.0* 34.1*  PLT 346 323   BMET:  Recent Labs    01/24/18 1209 01/25/18 0618  NA 140 140  K 4.2 4.2  CL 108 111  CO2 21* 18*  GLUCOSE 107* 73  BUN 25* 17  CREATININE 1.84* 1.47*  CALCIUM 8.9 8.6*    CBG (last 3)  No results for input(s): GLUCAP in the last 72 hours. PT/INR:   Recent Labs    01/24/18 1209  LABPROT 20.7*  INR 1.79    CXR:  N/A  Transthoracic Echocardiography  Patient:    Zoriah, Pulice MR #:       [id number] Study Date: 01/25/2018 Gender:     F Age:        77 Height:     160 cm Weight:     71.1 kg BSA:        1.8 m^2 Pt. Status: Room:       3W01C   Faylene Kurtz, Coralee Pesa  REFERRING    Rondel Jumbo  ATTENDING    Samuella Cota  ADMITTING    Lady Deutscher  PERFORMING   [facility],  Inpatient  SONOGRAPHER  Madelaine Etienne  cc:  ------------------------------------------------------------------- LV EF: 50% -   55%  ------------------------------------------------------------------- History:   PMH:  Hyperlipidemia, Mitral Insufficiency, Status Post Minimally Invasive Mitral Valve Repair, Non-Rheumatic Mitral Insufficiency, Essential Hypertension, Congestive Heart Failure, Atrial Flutter. CVA  ------------------------------------------------------------------- Study Conclusions  - Left ventricle: Abnormal septal motion Systolic function was   normal. The estimated ejection fraction was in the range of 50%   to 55%. The study is not technically sufficient to allow   evaluation of LV diastolic function. - Aortic valve: Valve area (VTI): 3.31 cm^2. Valve area (Vmax):   3.77 cm^2. Valve area (Vmean): 3.33 cm^2. 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 Trivial   residual MR with somwwhat tight repair and functional MS mean   gradient 7 mmHg peak 20 mmHg. Valve area by continuity equation   (using LVOT flow): 1.63 cm^2. - Left atrium: The atrium was moderately dilated. - Right atrium: The atrium was moderately dilated. - Atrial septum: No defect or patent foramen ovale  was identified. - Tricuspid valve: There was mild-moderate regurgitation. - Pulmonary arteries: PA peak pressure: 60 mm Hg (S).  ------------------------------------------------------------------- Study data:  Comparison was made to the study of 06/12/2017.  Study status:  Routine.  Procedure:  The patient reported no pain pre or post test. Transthoracic echocardiography. Image quality was good. Study completion:  There were no complications. Transthoracic echocardiography.  M-mode, complete 2D, spectral Doppler, and color Doppler.  Birthdate:  Patient birthdate: May 24, 1941.  Age:  Patient is 77 yr old.  Sex:  Gender: female. BMI: 27.8 kg/m^2.  Blood pressure:     140/60  Patient  status: Inpatient.  Study date:  Study date: 01/25/2018. Study time: 08:33 AM.  Location:  Bedside.  -------------------------------------------------------------------  ------------------------------------------------------------------- Left ventricle:  Abnormal septal motion  Wall thickness was normal.   Systolic function was normal. The estimated ejection fraction was in the range of 50% to 55%. The study is not technically sufficient to allow evaluation of LV diastolic function.  ------------------------------------------------------------------- Aortic valve:   Structurally normal valve. Trileaflet. Cusp separation was normal.  Doppler:  Transvalvular velocity was within the normal range. There was no stenosis. There was no regurgitation.    VTI ratio of LVOT to aortic valve: 0.87. Valve area (VTI): 3.31 cm^2. Indexed valve area (VTI): 1.84 cm^2/m^2. Peak velocity ratio of LVOT to aortic valve: 0.99. Valve area (Vmax): 3.77 cm^2. Indexed valve area (Vmax): 2.1 cm^2/m^2. Mean velocity ratio of LVOT to aortic valve: 0.88. Valve area (Vmean): 3.33 cm^2. Indexed valve area (Vmean): 1.85 cm^2/m^2.    Mean gradient (S): 3 mm Hg. Peak gradient (S): 8 mm Hg.  ------------------------------------------------------------------- Aorta:  The aorta was normal, not dilated, and non-diseased.  ------------------------------------------------------------------- Mitral valve:  Post repair with annuloplasty ring. Trivial residual MR with somwwhat tight repair and functional MS mean gradient 7 mmHg peak 20 mmHg.  Doppler:     Valve area by continuity equation (using LVOT flow): 1.63 cm^2. Indexed valve area by continuity equation (using LVOT flow): 0.91 cm^2/m^2.    Mean gradient (D): 7 mm Hg. Peak gradient (D): 17 mm Hg.  ------------------------------------------------------------------- Left atrium:  The atrium was moderately  dilated.  ------------------------------------------------------------------- Atrial septum:  No defect or patent foramen ovale was identified.   ------------------------------------------------------------------- Right ventricle:  The cavity size was normal. Wall thickness was normal. Systolic function was normal.  ------------------------------------------------------------------- Tricuspid valve:   Doppler:  There was mild-moderate regurgitation.   ------------------------------------------------------------------- Right atrium:  The atrium was moderately dilated.  ------------------------------------------------------------------- Pericardium:  The pericardium was normal in appearance.  ------------------------------------------------------------------- Systemic veins: Inferior vena cava: The vessel was normal in size. The respirophasic diameter changes were in the normal range (>= 50%), consistent with normal central venous pressure.  ------------------------------------------------------------------- Post procedure conclusions Ascending Aorta:  - The aorta was normal, not dilated, and non-diseased.  ------------------------------------------------------------------- Measurements   Left ventricle                           Value          Reference  LV ID, ED, PLAX chordal                  52    mm       43 - 52  LV ID, ES, PLAX chordal                  34    mm       23 - 38  LV fx shortening, PLAX chordal           35    %        >=29  LV PW thickness, ED                      13    mm       ----------  IVS/LV PW ratio, ED                      0.62           <=1.3  Stroke volume, 2D                        91    ml       ----------  Stroke volume/bsa, 2D                    51    ml/m^2   ----------  LV ejection fraction, 1-p A4C            56    %        ----------  LV end-diastolic volume, 2-p             166   ml       ----------  LV end-systolic volume, 2-p               77    ml       ----------  LV ejection fraction, 2-p                54    %        ----------  Stroke volume, 2-p                       90    ml       ----------  LV end-diastolic volume/bsa, 2-p         92    ml/m^2   ----------  LV end-systolic volume/bsa, 2-p          43    ml/m^2   ----------  Stroke volume/bsa, 2-p                   49.8  ml/m^2   ----------  LV e&', lateral                           8.33  cm/s     ----------  LV E/e&', lateral                         24.73          ----------  LV e&', medial                            4.06  cm/s     ----------  LV E/e&', medial                          50.74          ----------  LV e&', average                           6.2   cm/s     ----------  LV E/e&', average                         33.25          ----------    Ventricular septum                       Value          Reference  IVS thickness, ED                        8     mm       ----------    LVOT                                     Value          Reference  LVOT ID, S                               22    mm       ----------  LVOT area                                3.8   cm^2     ----------  LVOT peak velocity, S                    138   cm/s     ----------  LVOT mean velocity, S                    74.1  cm/s     ----------  LVOT VTI, S                              23.9  cm       ----------  LVOT peak gradient, S                    8     mm Hg    ----------    Aortic valve                             Value          Reference  Aortic valve peak velocity, S            139   cm/s     ----------  Aortic valve mean velocity, S            84.5  cm/s     ----------  Aortic valve VTI, S                      27.4  cm       ----------  Aortic mean gradient, S                  3     mm Hg    ----------  Aortic peak gradient, S                  8     mm Hg    ----------  VTI ratio, LVOT/AV  0.87           ----------  Aortic valve area, VTI                    3.31  cm^2     ----------  Aortic valve area/bsa, VTI               1.84  cm^2/m^2 ----------  Velocity ratio, peak, LVOT/AV            0.99           ----------  Aortic valve area, peak velocity         3.77  cm^2     ----------  Aortic valve area/bsa, peak              2.1   cm^2/m^2 ----------  velocity  Velocity ratio, mean, LVOT/AV            0.88           ----------  Aortic valve area, mean velocity         3.33  cm^2     ----------  Aortic valve area/bsa, mean              1.85  cm^2/m^2 ----------  velocity    Aorta                                    Value          Reference  Aortic root ID, ED                       33    mm       ----------  Ascending aorta ID, A-P, S               28    mm       ----------    Left atrium                              Value          Reference  LA ID, A-P, ES                           48    mm       ----------  LA ID/bsa, A-P                   (H)     2.67  cm/m^2   <=2.2  LA volume, S                             110   ml       ----------  LA volume/bsa, S                         61.2  ml/m^2   ----------  LA volume, ES, 1-p A4C                   135   ml       ----------  LA volume/bsa, ES, 1-p A4C               75.1  ml/m^2   ----------  LA volume, ES,  1-p A2C                   91.1  ml       ----------  LA volume/bsa, ES, 1-p A2C               50.7  ml/m^2   ----------    Mitral valve                             Value          Reference  Mitral E-wave peak velocity              206   cm/s     ----------  Mitral mean velocity, D                  115   cm/s     ----------  Mitral deceleration time                 180   ms       150 - 230  Mitral mean gradient, D                  7     mm Hg    ----------  Mitral peak gradient, D                  17    mm Hg    ----------  Mitral valve area, LVOT                  1.63  cm^2     ----------  continuity  Mitral valve area/bsa, LVOT              0.91  cm^2/m^2 ----------  continuity  Mitral  annulus VTI, D                    55.7  cm       ----------    Pulmonary arteries                       Value          Reference  PA pressure, S, DP               (H)     60    mm Hg    <=30    Tricuspid valve                          Value          Reference  Tricuspid regurg peak velocity           360   cm/s     ----------  Tricuspid peak RV-RA gradient            52    mm Hg    ----------    Right atrium                             Value          Reference  RA ID, S-I, ES, A4C              (H)     64.9  mm       34 - 49  RA area, ES, A4C                 (  H)     29.4  cm^2     8.3 - 19.5  RA volume, ES, A/L                       105   ml       ----------  RA volume/bsa, ES, A/L                   58.4  ml/m^2   ----------    Systemic veins                           Value          Reference  Estimated CVP                            8     mm Hg    ----------    Right ventricle                          Value          Reference  RV ID, ED, PLAX                  (H)     41    mm       19 - 38  RV pressure, S, DP               (H)     60    mm Hg    <=30  Legend: (L)  and  (H)  mark values outside specified reference range.  ------------------------------------------------------------------- Prepared and Electronically Authenticated by  Jenkins Rouge, M.D. 2019-06-17T10:27:45   Assessment/Plan:  Patient is well known to me from her recent surgery.  Events of last 48 hours and recommendations from Dr. Leonie Man and neurology team noted.  Agree w/ plans.  Will continue to follow intermittently but please call if specific problems or questions arise.   Rexene Alberts, MD 01/26/2018 6:26 PM

## 2018-01-26 NOTE — Progress Notes (Signed)
   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 -== continue to hold Amiodarone.  Defer decision re: restarting oral warfarin to Neurology team.  Will follow along @ a distance.   Call with ?s   Glenetta Hew, MD   For questions or updates, please contact [place] Please consult [email or web address] for contact info under Cardiology/STEMI.

## 2018-01-26 NOTE — Consult Note (Signed)
Consultation Note Date: 01/26/2018   Patient Name: Brooke Chavez  DOB: 10/26/1940  MRN: [id number]  Age / Sex: 77 y.o., female  PCP: Brooke Pretty, FNP Referring Physician: Samuella Cota, MD  Reason for Consultation: Establishing goals of care  HPI/Patient Profile: 77 y.o. female  with past medical history of small pancreatic mass (?neuroendocrine on PET- no tissue diagnosis, further workup delayed d/t cardiac surgery), a. 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 MRI on 6/17 showed 12m L to R midline shift- plan to monitor and transfer to ICU if clinical worsening. Patient eating soft mechanical diet, drinking thin liquids, participating in PT/OT/SLP, has been recommended for CIR- awaiting insurance approval. Palliative medicine consulted for [place]    Clinical Assessment and Goals of Care:  I have reviewed medical records including EPIC notes, labs and imaging, received report from Dr. SLeonie Manand SBurnetta Sabin NP, assessed the patient and then met at the bedside along with patient's daughter- Brooke Chavez and son  to discuss diagnosis prognosis, GOC, EOL wishes, disposition and options.   Patient sitting up in chair. Greets me with "Hey". Answers yes and no questions appropriately at first, but then perseverates on "yes" answer on follow up. Patient is unable to participate in GEzeldiscussion so I met separately with her son and daughter.  I introduced Palliative Medicine as specialized medical care for people living with serious illness. It focuses on providing relief from the symptoms and stress of a serious illness. The goal is to improve quality of life for both the patient and the family.  We discussed a brief life review of the patient. She is known as the mStage managerof the family. The oldest living of several  siblings. 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 She is known in her community as loving and as everyone's grandmother. 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  As far as functional and nutritional status- prior to admission she was recovering from mitral valve repair. She was walking in her home, but had not yet left the home. She was independent with ADL's and feeding herself.     We discussed their current illness and what it means in the larger context of their on-going co-morbidities.  Natural disease trajectory and expectations at EOL were discussed. The patient had not completed advanced directives, and had not discussed EOL wishes or advanced directives with family. However, family knows that patient is very independent and values this. They could tell that she was not happy with having to be fed by her granddaughter. They would not want her in a state of full disability or to be left "suffering in a bed".  Advanced directives, concepts specific to code status, artifical feeding and hydration, and rehospitalization were considered and discussed. Patient's children agree that if patient was not breathing and did not have a pulse then CPR  would not benefit her in her current state. They agree with DNR order for patient. We discussed future possibilities of recurrent infections, deconditioning, worsening mental status, recurrent stroke, that could affect patient's quality of life and prognosis. Patient's children state they are aware of these possible outcomes and would be able to make a transition to comfort care in the event they felt that their mother's quality of life was not going to be able to improve or be what she would desire.  We discussed what transition to comfort care would include. Hospice and Palliative Care services outpatient were explained and offered. They would like Palliative services to continue to  follow through hospitalization and after discharge and transition to Hospice if patient should decline.   Questions and concerns were addressed.  Hard Choices booklet left for review. The family was encouraged to call with questions or concerns.   Primary Decision Maker NEXT OF KIN- patient's children    SUMMARY OF RECOMMENDATIONS -DNR with FULL SCOPE CARE -Hopeful for d/c to CIR -Recommend Palliative f/u after d/c -PMT will continue to follow during hospitalization   Code Status/Advance Care Planning:  DNR  Palliative Prophylaxis:   Aspiration and Delirium Protocol  Additional Recommendations (Limitations, Scope, Preferences):  Full Scope Treatment  Prognosis:    Unable to determine  Discharge Planning: To Be Determined  Primary Diagnoses: Present on Admission: . Hyperlipidemia with target LDL less than 100 . (Resolved) MITRAL REGURGITATION . Essential hypertension . GERD (gastroesophageal reflux disease) . Chronic diastolic congestive heart failure ([place]) . Atrial flutter ([place]) . Stroke (cerebrum) ([place])   I have reviewed the medical record, interviewed the patient and family, and examined the patient. The following aspects are pertinent.  Past Medical History:  Diagnosis Date  . Atrial flutter ([place])    Typical by EKG diagnosis 9/11 s/p CIT ablation 11/11  . Bradycardia   . Chronic diastolic congestive heart failure ([place])   . DJD (degenerative joint disease)   . Dysrhythmia   . Femur fracture, left ([place]) 2013  . Heart murmur   . Hyperlipidemia    x5 years  . Hypertension    Since 1997  . Liver masses 11/13/2017   Multiple small nodules seen on CT and MRI  . Mitral regurgitation    severe  . 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 TR (tricuspid regurgitation)     Mild with RA enlargment   Social History   Socioeconomic History  . Marital status: Widowed    Spouse name: Not on file  . Number of children: Not on file  . Years of education: Not on file  . Highest education level: Not on file  Occupational History  . Not on file  Social Needs  . Financial resource strain: Not on file  . Food insecurity:    Worry: Not on file    Inability: Not on file  . Transportation needs:    Medical: Not on file    Non-medical: Not on file  Tobacco Use  . Smoking status: Never Smoker  . Smokeless tobacco: Never Used  Substance and Sexual Activity  . Alcohol use: No  . Drug use: No  . Sexual activity: Never  Lifestyle  . Physical activity:    Days per week: Not on file    Minutes per session: Not on file  .  Stress: Not on file  Relationships  . Social connections:    Talks on phone: Not on file    Gets together: Not on file    Attends religious service: Not on file    Active member of club or organization: Not on file    Attends meetings of clubs or organizations: Not on file    Relationship status: Not on file  Other Topics Concern  . Not on file  Social History Narrative   Lives in [place]   Her daughter lives with her   Family History  Problem Relation Age of Onset  . Hypertension Mother   . Cancer Sister        leukemia  . Diabetes Brother   . Stroke Sister   . Diabetes Brother   . Heart attack Brother   . Healthy Daughter   . Healthy Son   . Healthy Daughter    Scheduled Meds: .  stroke: mapping our early stages of recovery book   Does not apply Once  . [START ON 01/27/2018] aspirin EC  81 mg Oral Daily  . atorvastatin  40 mg Oral QHS   Continuous Infusions: . sodium chloride 75 mL/hr at 01/26/18 0035   PRN Meds:.acetaminophen **OR** acetaminophen, bisacodyl, hydrALAZINE, HYDROcodone-acetaminophen, LORazepam, ondansetron **OR** ondansetron (ZOFRAN) IV, senna-docusate Medications Prior to Admission:  Prior to Admission  medications   Medication Sig Start Date End Date Taking? Authorizing Provider  acetaminophen (TYLENOL) 500 MG tablet Take 1,000 mg by mouth every 6 (six) hours as needed for moderate pain or headache.   Yes [provider]  amiodarone (PACERONE) 200 MG tablet Take 1 tablet (200 mg total) by mouth daily. 01/14/18  Yes Lars Pinks M, PA-C  atorvastatin (LIPITOR) 40 MG tablet Take 1 tablet (40 mg total) by mouth at bedtime. 11/03/17  Yes Hassell Done, Mary-Margaret, FNP  ferrous sulfate 325 (65 FE) MG tablet Take 1 tablet (325 mg total) by mouth daily with breakfast. For one month then stop. 01/14/18 01/14/19 Yes Tacy Dura, Donielle M, PA-C  hydrochlorothiazide (HYDRODIURIL) 25 MG tablet Take 1 tablet (25 mg total) by mouth daily. 01/14/18  Yes Lars Pinks M, PA-C  warfarin (COUMADIN) 2.5 MG tablet Take 1 tablet (2.5 mg total) by mouth daily at 6 PM. Or as directed 01/14/18  Yes Lars Pinks M, PA-C  famotidine (PEPCID) 20 MG tablet Take 1 tablet (20 mg total) by mouth 2 (two) times daily. 07/29/11 10/29/11  Richardson Dopp T, PA-C   Allergies  Allergen Reactions  . Oxycodone-Acetaminophen Anxiety and Other (See Comments)    Hallucinations  . Aspirin Nausea And Vomiting   Review of Systems  Unable to perform ROS: Acuity of condition    Physical Exam  Constitutional:  Frail, elderly  HENT:  Crusted excoriation to R face  Neurological: She is alert.  UTA orientation, aphasic  Nursing note and vitals reviewed.   Vital Signs: BP 127/61 (BP Location: Left Arm)   Pulse 78   Temp 99.4 F (37.4 C) (Oral)   Resp 17   Ht 5' 3" (1.6 m)   Wt 76.5 kg (168 lb 10.4 oz)   LMP 11/05/1992   SpO2 95%   BMI 29.88 kg/m  Pain Scale: 0-10   Pain Score: 0-No pain   SpO2: SpO2: 95 % O2 Device:SpO2: 95 % O2 Flow Rate: .O2 Flow Rate (L/min): 2 L/min  IO: Intake/output summary:   Intake/Output Summary (Last 24 hours) at 01/26/2018 1338 Last data filed at 01/26/2018 0324 Gross per 24  hour  Intake 1393.75 ml  Output 925 ml  Net 468.75 ml    LBM: Last BM Date: 01/23/18 Baseline Weight: Weight: 71.7 kg (158 lb) Most recent weight: Weight: 76.5 kg (168 lb 10.4 oz)     Palliative Assessment/Data: PPS: 30%     Thank you for this consult. Palliative medicine will continue to follow and assist as needed.   Time In: 1200 Time Out: 1400 Time Total: 120 mins Prolonged services billed: Yes Greater than 50%  of this time was spent counseling and coordinating care related to the above assessment and plan.  Signed by: Mariana Kaufman, AGNP-C Palliative Medicine    Please contact Palliative Medicine Team phone at [phone] for questions and concerns.  For individual provider: See Shea Evans

## 2018-01-26 NOTE — PMR Pre-admission (Addendum)
PMR Admission Coordinator Pre-Admission Assessment  Patient: Brooke Chavez is an 77 y.o., female MRN: [id number] DOB: 05-Jun-1941 Height: 5\' 3"  (160 cm) Weight: 76.5 kg (168 lb 10.4 oz)              Insurance Information HMO:     PPO:      PCP:      IPA:      80/20:      OTHER: no HMO PRIMARY: Medicare a and b      Policy#: [id number]      Subscriber: pt benefits:  Phone #: passport one online     Name: 01/26/18 Eff. Date: 02/08/2005     Deduct: $1364      Out of Pocket Max: none      Life Max: none CIR: 100%      SNF: 20 full days Outpatient: 80%     Co-Pay: 20% Home Health: 100%      Co-Pay: none DME: 80%     Co-Pay: 20% Providers: pt choice  SECONDARY: [place]      Policy#: [id number]      Subscriber: pt  Medicaid Application Date:       Case Manager:  Disability Application Date:       Case Worker:   Emergency [place]    Name Relation Home Work Mobile   Martin,Christie Daughter [phone]  [phone]   Barrie Dunker [phone]     Martin,Nicole Daughter [phone]       Current Medical History   Patient Admitting Diagnosis: left MCA infarct  History of Present Illness:  HPI: Rayshell ANNALEI FRIESZ is a 77 year old right-handed female with complex medical history of mitral valve disease status post mitral valve repair 01/07/2018 per Dr. Roxy Manns and was discharged on Coumadin 01/14/2018 as well as history of hypertension and hyperlipidemia.  Presented to [facility] 01/24/2018 after being found down by family with right sided weakness and aphasia.  Per chart review patient lives with daughter.  Had been independent prior to admission without need for assistive device.  Cranial CT scan showed large territory acute left MCA infarction without hemorrhage.  Hyperdense left internal carotid artery left MCA compatible with acute thrombosis.  Patient was discharged to [facility] for ongoing care.  INR on admission  of 1.7.  CT angiogram of head and neck showed left proximal M1 occlusion with thrombus measuring approximately 8 mm in length.  Large perfusion penumbra 78 cc.  Interventional radiology consulted no plan for IR intervention due to high risk of bleeding.  Echocardiogram with ejection fraction of 55%.  Systolic function was normal.  No PFO seen.  MRI of the brain 01/25/2017 showed large left MCA territory infarction with petechial hemorrhage and a 2 mm left to right midline shift.  Old left cerebellar infarction.  Cardiology service follow-up no plan for TEE.   Total: 12 NIHSS    Past Medical History  Past Medical History:  Diagnosis Date  . Atrial flutter ([place])    Typical by EKG diagnosis 9/11 s/p CIT ablation 11/11  . Bradycardia   . Chronic diastolic congestive heart failure ([place])   . DJD (degenerative joint disease)   . Dysrhythmia   . Femur fracture, left ([place]) 2013  . Heart murmur   . Hyperlipidemia    x5 years  . Hypertension    Since 1997  . Liver masses 11/13/2017   Multiple small nodules seen on CT and MRI  . Mitral regurgitation  severe  . 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 TR (tricuspid regurgitation)    Mild with RA enlargment    Family History  family history includes Cancer in her sister; Diabetes in her brother and brother; Healthy in her daughter, daughter, and son; Heart attack in her brother; Hypertension in her mother; Stroke in her sister.  Prior Rehab/Hospitalizations:  Has the patient had major surgery during 100 days prior to admission? Yes  Current Medications   Current Facility-Administered Medications:  .   stroke: mapping our early stages of recovery book, , Does not apply, Once, Shawn Route, Clarise Cruz E, PA-C .  0.9 %  sodium chloride infusion, , Intravenous,  Continuous, Rondel Jumbo, PA-C, Last Rate: 75 mL/hr at 01/27/18 0340 .  acetaminophen (TYLENOL) tablet 650 mg, 650 mg, Oral, Q6H PRN **OR** acetaminophen (TYLENOL) suppository 650 mg, 650 mg, Rectal, Q6H PRN, Shawn Route, Sara E, PA-C .  aspirin EC tablet 81 mg, 81 mg, Oral, Daily, Biby, Sharon L, NP, 81 mg at 01/27/18 0903 .  atorvastatin (LIPITOR) tablet 40 mg, 40 mg, Oral, QHS, Biby, Sharon L, NP, 40 mg at 01/26/18 2204 .  bisacodyl (DULCOLAX) suppository 10 mg, 10 mg, Rectal, Daily PRN, Rondel Jumbo, PA-C .  hydrALAZINE (APRESOLINE) injection 5 mg, 5 mg, Intravenous, Q8H PRN, Shawn Route, Coralee Pesa, PA-C .  HYDROcodone-acetaminophen (NORCO/VICODIN) 5-325 MG per tablet 1-2 tablet, 1-2 tablet, Oral, Q4H PRN, Shawn Route, Coralee Pesa, PA-C .  LORazepam (ATIVAN) injection 0.25 mg, 0.25 mg, Intravenous, Q6H PRN, Wertman, Sara E, PA-C .  ondansetron (ZOFRAN) tablet 4 mg, 4 mg, Oral, Q6H PRN **OR** ondansetron (ZOFRAN) injection 4 mg, 4 mg, Intravenous, Q6H PRN, Wertman, Sara E, PA-C .  senna-docusate (Senokot-S) tablet 1 tablet, 1 tablet, Oral, QHS PRN, Rondel Jumbo, PA-C  Patients Current Diet:  Diet Order           DIET DYS 3 Room service appropriate? Yes; Fluid consistency: Thin  Diet effective now          Precautions / Restrictions Precautions Precautions: Fall Restrictions Weight Bearing Restrictions: No   Has the patient had 2 or more falls or a fall with injury in the past year?No  Prior Activity Level Community (5-7x/wk): Mod I with RW since last admit for surgery  Home Assistive Devices / [place] Devices/Equipment: Blood pressure cuff, Walker (specify type), Eyeglasses Home Equipment: None  Prior Device Use: Indicate devices/aids used by the patient prior to current illness, exacerbation or injury? Walker  Prior Functional Level Prior Function Level of Independence: Independent Comments: independent up until cardiac surgery (~2 weeks ago)  Self Care: Did the  patient need help bathing, dressing, using the toilet or eating?  Independent  Indoor Mobility: Did the patient need assistance with walking from room to room (with or without device)? Independent  Stairs: Did the patient need assistance with internal or external stairs (with or without device)? Needed some help  Functional Cognition: Did the patient need help planning regular tasks such as shopping or remembering to take medications? Needed some help  Current Functional Level Cognition  Arousal/Alertness: Awake/alert Overall Cognitive Status: Impaired/Different from baseline Current Attention Level: Focused Orientation Level: Oriented to person, Oriented to situation Following Commands: Follows one step commands inconsistently, Follows one step commands with increased time Safety/Judgement: Decreased awareness of safety, Decreased  awareness of deficits General Comments: R side inattention; pt will track across midline with cues; pt is a little impulsive and when given plan to stand begins to stand quickly without assistance Attention: Sustained, Selective Sustained Attention: Appears intact Selective Attention: Appears intact Memory: (UTA) Awareness: Impaired Awareness Impairment: Intellectual impairment, Emergent impairment Problem Solving: Impaired Problem Solving Impairment: Verbal basic, Functional basic    Extremity Assessment (includes Sensation/Coordination)  Upper Extremity Assessment: Defer to OT evaluation  Lower Extremity Assessment: RLE deficits/detail RLE Deficits / Details: R LE grossly 1-2/5    ADLs  Overall ADL's : Needs assistance/impaired Grooming: Wash/dry hands, Wash/dry face, Oral care, Minimal assistance, Supervision/safety, Set up, Sitting Upper Body Bathing: Cueing for sequencing, Maximal assistance Lower Body Bathing: Total assistance Upper Body Dressing : Maximal assistance Lower Body Dressing: Total assistance Toilet Transfer: +2 for  safety/equipment Toileting- Clothing Manipulation and Hygiene: Total assistance Functional mobility during ADLs: Moderate assistance, +2 for physical assistance    Mobility  Overal bed mobility: Needs Assistance Bed Mobility: Rolling, Sidelying to Sit Rolling: Mod assist Sidelying to sit: Mod assist Supine to sit: +2 for safety/equipment, Mod assist General bed mobility comments: cues for sequencing; assistance to bring hips/bilat LE toward EOB on R side and pt used bed rail to assist; assist needed to bring LE off EOB and pt able to elevate trunk most of the way into sitting position    Transfers  Overall transfer level: Needs assistance Equipment used: 2 person hand held assist(gait belt ) Transfers: Sit to/from Stand, Stand Pivot Transfers Sit to Stand: Mod assist, +2 physical assistance Stand pivot transfers: Mod assist, +2 physical assistance General transfer comment: pt stood X2 from EOB and required total A for pericare in standing; assistance required to block R knee, for balance, and for weigth shifting; multimodal cues for upright posture; pt able to take pivotal steps to recliner with R knee blocked for safety    Ambulation / Gait / Stairs / Wheelchair Mobility       Posture / Balance Balance Overall balance assessment: Needs assistance Sitting-balance support: Single extremity supported, Feet supported Sitting balance-Leahy Scale: Poor Standing balance support: Bilateral upper extremity supported, During functional activity Standing balance-Leahy Scale: Poor    Special needs/care consideration BiPAP/CPAP  N/a CPM  N/a Continuous Drip IV n/a Dialysis  N/a Life Vest  N/a Oxygen  N/a Special Bed  N/a Trach Size  N/a Wound Vac n/a Skin recent cardiac surgical incision; right facial blister Bowel mgmt: continent 6/18 Bladder mgmt:incontinent; external catheter Diabetic mgmt n/a Palliative consult for goals of care 6/18, patient made DNR Stitches removed 6/19 from  old anterior chest tube site and dry gauze dressing applied   Previous Home Environment Living Arrangements: (wife and caregivers)  Lives With: Family Available Help at Discharge: Family, Other (Comment) Type of Home: Mobile home Home Layout: One level Home Access: Stairs to enter Entrance Stairs-Rails: None Entrance Stairs-Number of Steps: 3 Bathroom Shower/Tub: Chiropodist: Standard Bathroom Accessibility: Yes How Accessible: Accessible via walker Home Care Services: No  Discharge Living Setting Plans for Discharge Living Setting: Lives with (comment), Mobile Home(daughter, Nicole) Type of Home at Discharge: Mobile home Discharge Home Layout: One level Discharge Home Access: Stairs to enter Entrance Stairs-Rails: None Entrance Stairs-Number of Steps: 3 Discharge Bathroom Shower/Tub: Tub/shower unit, Curtain Discharge Bathroom Toilet: Standard Discharge Bathroom Accessibility: Yes How Accessible: Accessible via walker Does the patient have any problems obtaining your medications?: No  Social/Family/Support Systems Patient Roles: Parent(Patient works as a  housekeeper 3 times per week) Contact Information: daughters Anticipated Caregiver: family Anticipated Caregiver's Contact Information: see above Ability/Limitations of Caregiver: [name], daughter works during the day but 70 year old grand child there or other family during those times Caregiver Availability: 24/7 Discharge Plan Discussed with Primary Caregiver: Yes Is Caregiver In Agreement with Plan?: Yes Does Caregiver/Family have Issues with Lodging/Transportation while Pt is in Rehab?: No(Family have been staying with her in hospital 24/7)  Goals/Additional Needs Patient/Family Goal for Rehab: supervision to min asisst with PT, OT, and SLP Expected length of stay: ELOS 20 to 25 days Pt/Family Agrees to Admission and willing to participate: Yes Program Orientation Provided & Reviewed with  Pt/Caregiver Including Roles  & Responsibilities: Yes  Decrease burden of Care through IP rehab admission: n/a  Possible need for SNF placement upon discharge: not anticipated  Patient Condition: This patient's condition remains as documented in the consult dated 01/26/2018, in which the Rehabilitation Physician determined and documented that the patient's condition is appropriate for intensive rehabilitative care in an inpatient rehabilitation facility. Will admit to inpatient rehab today.  Preadmission Screen Completed By:  Cleatrice Burke, 01/27/2018 10:46 AM ______________________________________________________________________   Discussed status with Dr. Letta Pate on 01/27/18 at  1045 and received telephone approval for admission today.  Admission Coordinator:  Cleatrice Burke, time 1062 Date 01/27/18

## 2018-01-26 NOTE — Progress Notes (Signed)
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 Patient was transferred to [facility] for possible endovascular intervention of the left M1 occlusion however, once transported to [facility], the patient's family decided not to pursue IR procedure given the increased risk of bleeding. Family at bedside deny patient having complaints of chest pain, palpitations, any recent infection, long distance travel, dizziness or syncope. Patient has no hx of tobacco or alcohol use with no prior history of stroke. Information obtained from daughter and son at bedside given pt current condition.     PT Comments    Patient is making progress toward PT goals. Pt required mod A +2 for functional transfers due to R side weakness and inattention. Pt tracking across midline with cues. Family present and supportive. Continue to recommend CIR level therapies.    Follow Up Recommendations  CIR;Supervision/Assistance - 24 hour     Equipment Recommendations  Other (comment)(TBD)    Recommendations for Other Services Rehab consult     Precautions / Restrictions Precautions Precautions: Fall Restrictions Weight Bearing Restrictions: No    Mobility  Bed Mobility Overal bed mobility: Needs Assistance Bed Mobility: Rolling;Sidelying to Sit Rolling: Mod assist Sidelying to sit: Mod assist       General bed mobility comments: cues for sequencing; assistance to bring hips/bilat LE toward EOB on R side and pt used bed rail to assist; assist needed to bring LE off EOB and pt able to elevate trunk most of the way into sitting position  Transfers Overall transfer level: Needs  assistance Equipment used: 2 person hand held assist(gait belt ) Transfers: Sit to/from [employer] Transfers Sit to Stand: Mod assist;+2 physical assistance Stand pivot transfers: Mod assist;+2 physical assistance       General transfer comment: pt stood X2 from EOB and required total A for pericare in standing; assistance required to block R knee, for balance, and for weigth shifting; multimodal cues for upright posture; pt able to take pivotal steps to recliner with R knee blocked for safety  Ambulation/Gait                 Stairs             Wheelchair Mobility    Modified Rankin (Stroke Patients Only) Modified Rankin (Stroke Patients Only) Pre-Morbid Rankin Score: No symptoms Modified Rankin: Severe disability     Balance Overall balance assessment: Needs assistance Sitting-balance support: Single extremity supported;Feet supported Sitting balance-Leahy Scale: Poor     Standing balance support: Bilateral upper extremity supported;During functional activity Standing balance-Leahy Scale: Poor                              Cognition Arousal/Alertness: Awake/alert Behavior During Therapy: Flat affect Overall Cognitive Status: Impaired/Different from baseline Area of Impairment: Attention;Following commands;Safety/judgement;Problem solving                   Current Attention Level: Focused   Following Commands: Follows one step commands inconsistently;Follows one step commands with increased time Safety/Judgement: Decreased awareness of safety;Decreased awareness of deficits   Problem Solving: Slow processing;Decreased initiation;Difficulty sequencing;Requires verbal cues;Requires tactile cues General Comments:  R side inattention; pt will track across midline with cues; pt is a little impulsive and when given plan to stand begins to stand quickly without assistance      Exercises      General Comments General comments (skin  integrity, edema, etc.): family present      Pertinent Vitals/Pain Pain Assessment: Faces Faces Pain Scale: No hurt    Home Living                      Prior Function            PT Goals (current goals can now be found in the care plan section) Acute Rehab PT Goals Patient Stated Goal: none stated PT Goal Formulation: With patient/family Time For Goal Achievement: 02/08/18 Potential to Achieve Goals: Fair Progress towards PT goals: Progressing toward goals    Frequency    Min 4X/week      PT Plan Current plan remains appropriate    Co-evaluation              AM-PAC PT "6 Clicks" Daily Activity  Outcome Measure  Difficulty turning over in bed (including adjusting bedclothes, sheets and blankets)?: Unable Difficulty moving from lying on back to sitting on the side of the bed? : Unable Difficulty sitting down on and standing up from a chair with arms (e.g., wheelchair, bedside commode, etc,.)?: Unable Help needed moving to and from a bed to chair (including a wheelchair)?: A Lot Help needed walking in hospital room?: A Lot Help needed climbing 3-5 steps with a railing? : Total 6 Click Score: 8    End of Session Equipment Utilized During Treatment: Gait belt Activity Tolerance: Patient tolerated treatment well Patient left: in chair;with call bell/phone within reach;with family/visitor present;with chair alarm set Nurse Communication: Mobility status PT Visit Diagnosis: Unsteadiness on feet (R26.81);Other abnormalities of gait and mobility (R26.89);Muscle weakness (generalized) (M62.81);Hemiplegia and hemiparesis Hemiplegia - Right/Left: Right Hemiplegia - dominant/non-dominant: Dominant Hemiplegia - caused by: Cerebral infarction     Time: 0926-1002 PT Time Calculation (min) (ACUTE ONLY): 36 min  Charges:  $Therapeutic Activity: 23-37 mins                    G Codes:       Earney Navy, PTA Pager: [phone]     Darliss Cheney 01/26/2018, 1:19 PM

## 2018-01-26 NOTE — Progress Notes (Signed)
STROKE TEAM PROGRESS NOTE     INTERVAL HISTORY Her son,daughter and multiple family members are at the bedside.  Pt  Remains alert, eyes open, sitting in chair.    Vitals:   01/26/18 0003 01/26/18 0300 01/26/18 0839 01/26/18 1402  BP: (!) 143/67 (!) 143/83 127/61 (!) 131/55  Pulse: 71 74 78 (!) 47  Resp: (!) 24 (!) 27 17 (!) 21  Temp: 98.3 F (36.8 C) 99.6 F (37.6 C) 99.4 F (37.4 C) 98.9 F (37.2 C)  TempSrc: Oral Oral Oral Oral  SpO2: 91% 95% 95% 98%  Weight: 168 lb 10.4 oz (76.5 kg)     Height:        CBC:  Recent Labs  Lab 01/24/18 1209 01/25/18 0618  WBC 11.4* 8.0  NEUTROABS 9.3*  --   HGB 10.1* 10.8*  HCT 31.0* 34.1*  MCV 98.1 99.1  PLT 346 397    Basic Metabolic Panel:  Recent Labs  Lab 01/24/18 1209 01/25/18 0618  NA 140 140  K 4.2 4.2  CL 108 111  CO2 21* 18*  GLUCOSE 107* 73  BUN 25* 17  CREATININE 1.84* 1.47*  CALCIUM 8.9 8.6*   Lipid Panel:     Component Value Date/Time   CHOL 140 01/25/2018 0618   CHOL 200 (H) 11/03/2017 1507   TRIG 89 01/25/2018 0618   TRIG 58 09/19/2014 0938   HDL 47 01/25/2018 0618   HDL 63 11/03/2017 1507   HDL 67 09/19/2014 0938   CHOLHDL 3.0 01/25/2018 0618   VLDL 18 01/25/2018 0618   LDLCALC 75 01/25/2018 0618   LDLCALC 106 (H) 11/03/2017 1507   LDLCALC 83 11/21/2013 0830   HgbA1c:  Lab Results  Component Value Date   HGBA1C 5.4 01/25/2018   Urine Drug Screen:     Component Value Date/Time   LABOPIA NONE DETECTED 01/24/2018 1327   COCAINSCRNUR NONE DETECTED 01/24/2018 1327   LABBENZ NONE DETECTED 01/24/2018 1327   AMPHETMU NONE DETECTED 01/24/2018 1327   THCU NONE DETECTED 01/24/2018 1327   LABBARB (A) 01/24/2018 1327    Result not available. Reagent lot number recalled by manufacturer.    Alcohol Level     Component Value Date/Time   ETH <10 01/24/2018 1209    IMAGING Ct Head Wo Contrast  Result Date: 01/24/2018 CLINICAL DATA:  Follow up stroke. History of hypertension, hyperlipidemia,  neuroendocrine tumor. EXAM: CT HEAD WITHOUT CONTRAST TECHNIQUE: Contiguous axial images were obtained from the base of the skull through the vertex without intravenous contrast. COMPARISON:  CT/CTA HEAD January 24, 2018. FINDINGS: BRAIN: No intraparenchymal hemorrhage, mass effect nor midline shift. Hypodense LEFT basal ganglia, LEFT insular ribbon sign, loss of LEFT frontotemporal gray-white matter junction. Old LEFT cerebellar infarcts. No parenchymal brain volume loss for age. No hydrocephalus. No abnormal extra-axial fluid collections. Basal cisterns are patent. VASCULAR: Moderate calcific atherosclerosis of the carotid siphons. Dense LEFT insular MCA. SKULL: No skull fracture. Occluded RIGHT facial soft tissue swelling without subcutaneous gas or radiopaque foreign bodies. SINUSES/ORBITS: The mastoid air-cells and included paranasal sinuses are well-aerated.The included ocular globes and orbital contents are non-suspicious. Status post bilateral ocular lens implants. OTHER: None. IMPRESSION: 1. Evolving acute nonhemorrhagic LEFT MCA territory infarct. Dense LEFT MCA consistent with thromboembolism. 2. Old LEFT cerebellar infarct. Electronically Signed   By: Brooke Chavez M.D.   On: 01/24/2018 23:32   Mr Brain Wo Contrast  Result Date: 01/25/2018 CLINICAL DATA:  Follow up stroke. History of hypertension, hyperlipidemia and neuroendocrine tumor. EXAM:  MRI HEAD WITHOUT CONTRAST TECHNIQUE: Multiplanar, multiecho pulse sequences of the brain and surrounding structures were obtained without intravenous contrast. COMPARISON:  CT HEAD January 24, 2018 FINDINGS: INTRACRANIAL CONTENTS: Confluent reduced diffusion LEFT frontoparietal and temporal lobes including insula, reduced diffusion LEFT basal ganglia. Subcentimeter discontinuous reduced diffusion LEFT parietal lobes. Areas of reduced diffusion demonstrate low ADC values, RIGHT FLAIR T2 hyperintense signal. Speckled susceptibility artifact LEFT frontal parietal  lobes with additional chronic microhemorrhages bilateral occipital lobes and RIGHT parietal lobe. 2 mm LEFT-to-RIGHT midline shift. Patchy supratentorial white matter FLAIR T2 hyperintensities exclusive of the aforementioned abnormality compatible with mild chronic small vessel ischemic disease. Old LEFT cerebellar infarct. No parenchymal brain volume loss for age. No hydrocephalus. No abnormal extra-axial fluid collections. VASCULAR: Normal major intracranial vascular flow voids present at skull base. SKULL AND UPPER CERVICAL SPINE: No abnormal sellar expansion. No suspicious calvarial bone marrow signal. Craniocervical junction maintained. SINUSES/ORBITS: The mastoid air-cells and included paranasal sinuses are well-aerated.The included ocular globes and orbital contents are non-suspicious. Status post bilateral ocular lens implant. OTHER: Patient is edentulous. IMPRESSION: 1. 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 2 mm LEFT-to-RIGHT midline shift. 2. Susceptibility artifact seen with old PRES, chronic hypertension, less likely amyloid angiopathy. 3. Old LEFT cerebellar infarct. Electronically Signed   By: Brooke Chavez M.D.   On: 01/25/2018 06:10   2D Echocardiogram  - Left ventricle: Abnormal septal motion Systolic function was normal. The estimated ejection fraction was in the range of 50% to 55%. The study is not technically sufficient to allow evaluation of LV diastolic function. - Aortic valve: Valve area (VTI): 3.31 cm^2. Valve area (Vmax): 3.77 cm^2. Valve area (Vmean): 3.33 cm^2. 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 Trivial residual MR with somwwhat tight repair and functional MS mean gradient 7 mmHg peak 20 mmHg. Valve area by continuity equation (using LVOT flow): 1.63 cm^2. - Left atrium: The atrium was moderately dilated. - Right atrium: The atrium was moderately dilated. - Atrial septum: No defect or patent foramen ovale was identified. - Tricuspid  valve: There was mild-moderate regurgitation. - Pulmonary arteries: PA peak pressure: 60 mm Hg (S).   PHYSICAL EXAM   GENERAL: Awake,  in NAD HEENT: - Normocephalic and atraumatic LUNGS - Clear to auscultation bilaterally with no wheezes CV - S1S2 RRR, no m/r/g, equal pulses bilaterally. Ext: warm, well perfused, intact peripheral pulses, no edema  NEURO:  Mental Status: awake alert  She is globally aphasic. Unable to name.  Unable to comprehend.  Unable to repeat. Does interact and tries to follow gaze. Follows a few simple midline commands only. Cranial Nerves: PERRL.  Left gaze preference unable to cross midline, does not blink to threat from the right, right nasolabial fold flattening,  Motor: Right arm 1/5, right leg 2/5, left arm 5/5, left leg 4/5 Tone: is diminished on the right and bulk is normal Sensation-grossly reduced sensation to noxious stim on the right side Coordination: Unable to test Gait- deferred   ASSESSMENT/PLAN Brooke Chavez is a 77 y.o. female with history of atrial flutter, mitral regurgitation status post mitral valve repair 2 weeks ago, bradycardia, hyperlipidemia found by family to have right sided weakness and aphasia the morning after going to bed ok the night before. Out of the window for tPA. High risk for IR due to pseudo normalization on CTP and increased risk of hemorrhage.  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 Hyperdense L ICA and L MCA c/w thrombosis. ASPECTS 4.     CTA head & neck L M1 occlusion with 75mm thrombus. L MCA infarct 37cc.    CT perfusion large penumbra 78cc  CT head 1/16 2315 evolving L MCA infarct. Dense L MCA. Old L cerebellar infarct  MRI  Large L MCA infarct w/ HT. 36mm L to R shift. Old L cerebellar infarct. Susceptibility infarct  2D Echo  EF 50-55%. No source of  embolus. MV annuloplasty ring, no PFO seen  LDL 75  HgbA1c 5.4  SCDs for VTE prophylaxis  D3 thin  warfarin daily prior to admission, now on aspirin 300 mg suppository daily. Likely add anticoagulation in the future. For now, high risk of hemorrhage. Will hold and reassess as pt progresses.  Therapy recommendations:  CIR  Disposition:  pending (lives w/ dtr, someone with her 24/7)  Palliative consult requested for goals of care  Atrial Flutter  Home anticoagulation:  warfarin daily   INR 1.79 on admission  CHA2DS2-VASc Score = at least 7, ?2 oral anticoagulation recommended  Age in Years:  ?7   +2    Sex:  Female   Female   +1    Hypertension History:  yes   +1     Diabetes Mellitus:  0  Congestive Heart Failure History:  yes   +1  Vascular Disease History:  0  Stroke/TIA/Thromboembolism History:  yes   +2   Hypertension  Stable, 140-150s . Permissive hypertension (OK if < 220/120) but gradually normalize in 5-7 days . Long-term BP goal normotensive  Hyperlipidemia  Home meds:  lipitor 40  resume statin  LDL 75, goal < 70  Continue statin at discharge  Other Stroke Risk Factors  Advanced age  Family hx stroke (sister)  CPAP at home  Chronic diastolic CHF  Other Active Problems  MR s/p MVR 2 weeks ago (minimally invasive repair and PFO closure), on warfarin post op  GERD  Anemia of chronic disease  Hx bradycardia  Hospital day # 2      .she presented with a large left MCA infarct but unfortunately was not a good thrombectomy candidate given advanced age, large stroke and being on anticoagulation. Ackerman aspirin for now and hold anticoagulation for 1 week due to large size of the stroke. Repeat CT scan in 1 week after her stroke and that there is no hemorrhagic transformation change aspirin to eliquis..no need for repeat head CT unless there is neurological worsening. I had a long discussion the patient's son, daughter and other family members at  the bedside and answered questions about her care. Family wants aggressive care for now but unwilling to talk to palliative care team further make long-term decisions. Discussed with Dr. Sarajane Jews and rehabilitation team coordinator. Greater than 50% time during this 25 minute visit was spent on counseling and coordination of care about her large stroke and answered questions.transfer to inpatient rehabilitation if she qualifies. Stroke team will sign off. Kindly call for questions. Antony Contras, MD Medical Director [place] Pager: [phone] 01/26/2018 2:23 PM  To contact Stroke Continuity provider, please refer to [email or web address] After hours, contact General Neurology

## 2018-01-26 NOTE — Progress Notes (Signed)
PROGRESS NOTE  Gennette AVERI KILTY ZOX:[id number] DOB: December 03, 1940 DOA: 01/24/2018 PCP: [name], FNP  Brief Narrative: 77 year old woman status post recent mitral valve repair, presented with stroke symptomatology.  Found to have large left MCA stroke, intervention was entertained and the patient was transferred to [facility] where ultimately the family decided against endovascular intervention. Followed by neurology, with final recs as below. Per neurology, can transfer to CIR 6/19 if stable.  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 --per neurology, continue ASA, hold anticoagulation for 1 week due to large size of stroke. --repeat CT can in 1 week and if no hemorrhagic transformation change ASA to apixaban.  --Dysphagia 3 (Mech soft);Thin liquid per ST  Status post minimally invasive mitral valve repair 5/30. 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 --continue to hold amiodarone per cardiology. No further recs.   Chronic diastolic CHF --stable.  Anemia of chronic disease --stable  Essential hypertension --Permissive hypertension  Hyperlipidemia --Continue statin  DVT prophylaxis: SCDs Code Status: Full Family Communication: multiple at bedside Disposition Plan: CIR?   Murray Hodgkins, MD  [facility] Direct contact: [phone] --Via amion app OR  --[email or web address]; password [email or web address]  7PM-7AM contact night coverage as above 01/26/2018, 1:28 PM  LOS: 2 days   Consultants:  Neurology   Cardiology   Procedures:  Echo Study Conclusions  - Left ventricle: Abnormal septal motion Systolic function was   normal. The estimated ejection fraction was in the range of 50%   to 55%. The study is not technically sufficient to allow   evaluation of LV diastolic function. - Aortic valve: Valve  area (VTI): 3.31 cm^2. Valve area (Vmax):   3.77 cm^2. Valve area (Vmean): 3.33 cm^2. 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 Trivial   residual MR with somwwhat tight repair and functional MS mean   gradient 7 mmHg peak 20 mmHg. Valve area by continuity equation   (using LVOT flow): 1.63 cm^2. - Left atrium: The atrium was moderately dilated. - Right atrium: The atrium was moderately dilated. - Atrial septum: No defect or patent foramen ovale was identified. - Tricuspid valve: There was mild-moderate regurgitation. - Pulmonary arteries: PA peak pressure: 60 mm Hg (S).  Antimicrobials:    Interval history/Subjective: Better today.   Objective: Vitals:  Vitals:   01/26/18 0300 01/26/18 0839  BP: (!) 143/83 127/61  Pulse: 74 78  Resp: (!) 27 17  Temp: 99.6 F (37.6 C) 99.4 F (37.4 C)  SpO2: 95% 95%    Exam:  Constitutional:   . Appears calm and comfortable Respiratory:  . CTA bilaterally, no w/r/r.  . Respiratory effort normal.  Cardiovascular:  Marland Kitchen Marked bradycardia, no m/r/g Skin:  . Hematoma over right cheek decreasing Neurologic:  . Right facial weakness, RUE & RLE weakness appear unchanged Psychiatric:  . Mental status o Mood, affect appropriate  I have personally reviewed the following:   Labs:  EKG Marked SB with 1st degree AVB and AV dissociation    Scheduled Meds: .  stroke: mapping our early stages of recovery book   Does not apply Once  . [START ON 01/27/2018] aspirin EC  81 mg Oral Daily  . atorvastatin  40 mg Oral QHS   Continuous Infusions: . sodium chloride 75 mL/hr at 01/26/18 0035    Principal Problem:   Acute ischemic left MCA stroke [facility]) Active Problems:   Hyperlipidemia  with target LDL less than 100   Essential hypertension   Atrial flutter (HCC)   GERD (gastroesophageal reflux disease)   Chronic diastolic congestive heart failure (HCC)   S/P minimally invasive mitral valve repair   Stroke (cerebrum) ([place])    LOS: 2 days

## 2018-01-26 NOTE — Progress Notes (Signed)
Inpatient Rehabilitation Admissions Coordinator  I met with pt, son, daughter, daughter in law and grandson at bedside. We discussed goals and expectations of an inpt rehab admit. They prefer an inpt rehab admit. I contacted Dr. Sarajane Jews via She[facility] Evans to clarify when pt medical workup complete to plan admit to CIR pending bed availability. I await Dr. Sarajane Jews to call me back. I would like to admit to CIR today.  Danne Baxter, RN, MSN Rehab Admissions Coordinator [phone] 01/26/2018 1:04 PM

## 2018-01-26 NOTE — Progress Notes (Signed)
  Speech Language Pathology Treatment: Dysphagia;Cognitive-Linquistic  Patient Details Name: Brooke Chavez MRN: [id number] DOB: [birth year] Today's Date: 01/26/2018 Time: 2993-7169 SLP Time Calculation (min) (ACUTE ONLY): 40 min  Assessment / Plan / Recommendation Clinical Impression  Pt seen with sips of coffee, f/u after am meal with family. Dughter does reports some coughing with overlarge sips and pocketing on right. She reports given her mom verbal cues to clear right side of mouth intermittently with success. SLP worked on training mall sip with self feeding. Pt with poor proprioception to monitor sips depsite effort. Tactile cues most helpful. Daughter provides assist.   Pts comprehension and expression are stable. Targeting improved awareness of Yes/No with visual aids. Pt pointed to yes/no on board with poor accuracy. Very perseverative on YEs. Will work on [employer] and pictures next session to elicit yes no; pt needs non language task. Pt still able to say name with max fading to moderate assist. Cleopatra Cedar quite beautifully with much improved articulation, though otherwise apraxic. Continue to recommend CIR. Pt very engaged, family supportive.    HPI HPI: Brooke Chavez is a 77 y.o. female past medical history of atrial flutter, mitral regurgitation status post mitral valve repair 2 weeks ago, bradycardia, hyperlipidemia, who was in her usual state of health last known normal 10 PM 01/23/2018, noted to be weak on the right side and aphasic. MRI shows Large LEFT MCA territory infarct with petechial hemorrhage. 2 mm LEFT-to-RIGHT midline shift.        SLP Plan  Continue with current plan of care       Recommendations  Diet recommendations: Dysphagia 3 (mechanical soft);Thin liquid Liquids provided via: Cup;Straw Medication Administration: Whole meds with liquid Supervision: Patient able to self feed Compensations: Slow rate;Small sips/bites;Monitor for anterior loss;Lingual sweep  for clearance of pocketing;Follow solids with liquid Postural Changes and/or Swallow Maneuvers: Seated upright 90 degrees                General recommendations: Rehab consult Oral Care Recommendations: Oral care BID Follow up Recommendations: Inpatient Rehab SLP Visit Diagnosis: Aphasia (R47.01) Plan: Continue with current plan of care       GO              [facility], MA CCC-SLP [phone]   Lynann Beaver 01/26/2018, 10:14 AM

## 2018-01-26 NOTE — Care Management Note (Signed)
Case Management Note  Patient Details  Name: Brooke Chavez MRN: [id number] Date of Birth: [birth year]  Subjective/Objective:      Pt admitted with CVA. She is from home with family.              Action/Plan: Recommendations are for CIR. CM following for d/c disposition.   Expected Discharge Date:  01/28/18               Expected Discharge Plan:  IP Rehab Facility  In-House Referral:  Clinical Social Work  Discharge planning Services  CM Consult  Post Acute Care Choice:    Choice offered to:     DME Arranged:    DME Agency:     HH Arranged:    HH Agency:     Status of Service:  In process, will continue to follow  If discussed at Long Length of Stay Meetings, dates discussed:    Additional Comments:  Pollie Friar, RN 01/26/2018, 10:44 AM

## 2018-01-26 NOTE — Progress Notes (Signed)
Inpatient Rehabilitation Admissions Coordinator  I received return call from Dr. Sarajane Jews who states pt not medically ready to d/c to CIR until tomorrow per neurology. I have made family aware. I will follow up tomorrow for possible admit pending bed availability.  Danne Baxter, RN, MSN Rehab Admissions Coordinator [phone] 01/26/2018 1:41 PM

## 2018-01-27 ENCOUNTER — Inpatient Hospital Stay (HOSPITAL_COMMUNITY)
Admission: RE | Admit: 2018-01-27 | Discharge: 2018-02-13 | DRG: 057 | Disposition: A | Discharge status: Home Health | Payer: Medicare Other | Source: Intra-hospital | Attending: Physical Medicine & Rehabilitation | Admitting: Physical Medicine & Rehabilitation

## 2018-01-27 ENCOUNTER — Encounter (HOSPITAL_COMMUNITY): Payer: Self-pay

## 2018-01-27 ENCOUNTER — Inpatient Hospital Stay (HOSPITAL_COMMUNITY): Payer: Medicare Other

## 2018-01-27 ENCOUNTER — Other Ambulatory Visit: Payer: Self-pay

## 2018-01-27 ENCOUNTER — Ambulatory Visit: Payer: Medicare Other | Admitting: Cardiology

## 2018-01-27 DIAGNOSIS — I639 Cerebral infarction, unspecified: Secondary | ICD-10-CM | POA: Diagnosis not present

## 2018-01-27 DIAGNOSIS — I080 Rheumatic disorders of both mitral and aortic valves: Secondary | ICD-10-CM

## 2018-01-27 DIAGNOSIS — I63412 Cerebral infarction due to embolism of left middle cerebral artery: Principal | ICD-10-CM

## 2018-01-27 DIAGNOSIS — E876 Hypokalemia: Secondary | ICD-10-CM

## 2018-01-27 DIAGNOSIS — I483 Typical atrial flutter: Secondary | ICD-10-CM

## 2018-01-27 DIAGNOSIS — R4701 Aphasia: Secondary | ICD-10-CM

## 2018-01-27 DIAGNOSIS — I4891 Unspecified atrial fibrillation: Secondary | ICD-10-CM | POA: Diagnosis present

## 2018-01-27 DIAGNOSIS — N183 Chronic kidney disease, stage 3 unspecified: Secondary | ICD-10-CM

## 2018-01-27 DIAGNOSIS — N390 Urinary tract infection, site not specified: Secondary | ICD-10-CM | POA: Diagnosis not present

## 2018-01-27 DIAGNOSIS — E785 Hyperlipidemia, unspecified: Secondary | ICD-10-CM

## 2018-01-27 DIAGNOSIS — E46 Unspecified protein-calorie malnutrition: Secondary | ICD-10-CM

## 2018-01-27 DIAGNOSIS — I482 Chronic atrial fibrillation: Secondary | ICD-10-CM | POA: Diagnosis not present

## 2018-01-27 DIAGNOSIS — I69392 Facial weakness following cerebral infarction: Secondary | ICD-10-CM

## 2018-01-27 DIAGNOSIS — I5032 Chronic diastolic (congestive) heart failure: Secondary | ICD-10-CM | POA: Diagnosis not present

## 2018-01-27 DIAGNOSIS — E8809 Other disorders of plasma-protein metabolism, not elsewhere classified: Secondary | ICD-10-CM

## 2018-01-27 DIAGNOSIS — D62 Acute posthemorrhagic anemia: Secondary | ICD-10-CM | POA: Diagnosis not present

## 2018-01-27 DIAGNOSIS — Z8679 Personal history of other diseases of the circulatory system: Secondary | ICD-10-CM | POA: Diagnosis not present

## 2018-01-27 DIAGNOSIS — Z952 Presence of prosthetic heart valve: Secondary | ICD-10-CM

## 2018-01-27 DIAGNOSIS — I629 Nontraumatic intracranial hemorrhage, unspecified: Secondary | ICD-10-CM | POA: Diagnosis not present

## 2018-01-27 DIAGNOSIS — I63512 Cerebral infarction due to unspecified occlusion or stenosis of left middle cerebral artery: Secondary | ICD-10-CM | POA: Diagnosis present

## 2018-01-27 DIAGNOSIS — I10 Essential (primary) hypertension: Secondary | ICD-10-CM | POA: Diagnosis not present

## 2018-01-27 DIAGNOSIS — I69351 Hemiplegia and hemiparesis following cerebral infarction affecting right dominant side: Secondary | ICD-10-CM | POA: Diagnosis not present

## 2018-01-27 DIAGNOSIS — I69320 Aphasia following cerebral infarction: Secondary | ICD-10-CM

## 2018-01-27 DIAGNOSIS — Z7901 Long term (current) use of anticoagulants: Secondary | ICD-10-CM

## 2018-01-27 DIAGNOSIS — Z9889 Other specified postprocedural states: Secondary | ICD-10-CM

## 2018-01-27 DIAGNOSIS — Z823 Family history of stroke: Secondary | ICD-10-CM

## 2018-01-27 DIAGNOSIS — I110 Hypertensive heart disease with heart failure: Secondary | ICD-10-CM | POA: Diagnosis present

## 2018-01-27 DIAGNOSIS — I480 Paroxysmal atrial fibrillation: Secondary | ICD-10-CM | POA: Diagnosis not present

## 2018-01-27 DIAGNOSIS — I69391 Dysphagia following cerebral infarction: Secondary | ICD-10-CM

## 2018-01-27 DIAGNOSIS — Z79899 Other long term (current) drug therapy: Secondary | ICD-10-CM

## 2018-01-27 DIAGNOSIS — R269 Unspecified abnormalities of gait and mobility: Secondary | ICD-10-CM

## 2018-01-27 DIAGNOSIS — I69398 Other sequelae of cerebral infarction: Secondary | ICD-10-CM | POA: Diagnosis not present

## 2018-01-27 DIAGNOSIS — I130 Hypertensive heart and chronic kidney disease with heart failure and stage 1 through stage 4 chronic kidney disease, or unspecified chronic kidney disease: Secondary | ICD-10-CM | POA: Diagnosis present

## 2018-01-27 HISTORY — DX: Cerebral infarction due to unspecified occlusion or stenosis of left middle cerebral artery: I63.512

## 2018-01-27 MED ORDER — SORBITOL 70 % SOLN: 30.0000 mL | Freq: Every day | Status: DC | PRN

## 2018-01-27 MED ORDER — ONDANSETRON HCL 4 MG PO TABS: 4.0000 mg | ORAL_TABLET | Freq: Four times a day (QID) | ORAL | Status: DC | PRN

## 2018-01-27 MED ORDER — ACETAMINOPHEN 650 MG RE SUPP: 650.0000 mg | Freq: Four times a day (QID) | RECTAL | Status: DC | PRN

## 2018-01-27 MED ORDER — ONDANSETRON HCL 4 MG/2ML IJ SOLN: 4.0000 mg | Freq: Four times a day (QID) | INTRAMUSCULAR | Status: DC | PRN

## 2018-01-27 MED ORDER — ASPIRIN EC 81 MG PO TBEC
81.0000 mg | DELAYED_RELEASE_TABLET | Freq: Every day | ORAL | Status: DC
Start: 2018-01-28 — End: 2018-02-09
  Administered 2018-01-28 – 2018-02-08 (×12): 81 mg via ORAL
  Filled 2018-01-27 (×14): qty 1

## 2018-01-27 MED ORDER — SENNOSIDES-DOCUSATE SODIUM 8.6-50 MG PO TABS
1.0000 | ORAL_TABLET | Freq: Every evening | ORAL | Status: DC | PRN
  Administered 2018-02-09: 1 via ORAL
  Filled 2018-01-27: qty 1

## 2018-01-27 MED ORDER — ATORVASTATIN CALCIUM 40 MG PO TABS
40.0000 mg | ORAL_TABLET | Freq: Every day | ORAL | Status: DC
  Administered 2018-01-27 – 2018-02-12 (×17): 40 mg via ORAL
  Filled 2018-01-27 (×17): qty 1

## 2018-01-27 MED ORDER — ACETAMINOPHEN 325 MG PO TABS
650.0000 mg | ORAL_TABLET | Freq: Four times a day (QID) | ORAL | Status: DC | PRN
  Administered 2018-01-29 – 2018-02-13 (×5): 650 mg via ORAL
  Filled 2018-01-27 (×5): qty 2

## 2018-01-27 MED ORDER — APIXABAN 5 MG PO TABS
5.0000 mg | ORAL_TABLET | Freq: Two times a day (BID) | ORAL | Status: DC
  Filled 2018-01-27: qty 1

## 2018-01-27 NOTE — Discharge Summary (Signed)
Physician Discharge Summary  Brooke Chavez ZJQ:[id number] DOB: 1941/03/04 DOA: 01/24/2018  PCP: [name], FNP  Admit date: 01/24/2018 Discharge date: 01/27/2018  Admitted From: Home  Disposition:  CIR   Recommendations for Outpatient Follow-up:  1. Please follow up with Neurology after discharge 2. Please obtain BMP/CBC in one week 3. Please follow up on the following pending results:  Home Health: N/A  Equipment/Devices: TBD at CIR  Discharge Condition: Fair  CODE STATUS: FULL Diet recommendation: Cardiac  Brief/Interim Summary: Mrs. Strauser is a 77 y.o. F  with recent mitral valve repair, presented with hemiparesis, aphasia, found to have large left MCA stroke.  She was outside the window for tPA at presentation, and felt to be too high risk for IR clot retrieval by Neurology due to features on CT perfusion study and so this was declined by family.       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  CTA head and neck showed 46mm L M1 thrombus.  CT perfusion study showed 37cc infarct, 78cc penumbra.  MRI showed mild shift.  Echo showed no embolic source.  LDL 75, HgbA1c 5.4%.  Patient discharged on statin therapy.  CHADS2Vasc 7.  Warfarin was stopped due to risk for hemorrhagic conversion.  Plan for CT head in 5 days, then restart anticoagulation with apixaban, recommended for stroke prophylaxis by Neurology.  Dysphagia 3 (Mech soft);Thin liquid per ST  Status post minimally invasive mitral valve repair 5/30. See above re: CT in 5 days then apixaban.  Sinus bradycardia with 1st degree AVB, intermittent junctional escape rhythm with AV dissociation. Cardiology were consulted, recommended holding amiodarone.     Chronic diastolic CHF Appeared euvolemic.  Not on diuretic.   Anemia of chronic disease Stable  Essential  hypertension HCTZ was held for permissive hypertension.  Should be restarted by the 21st, to normalize BP within 5-7 days.           Discharge Diagnoses:  Principal Problem:   Acute ischemic left MCA stroke (HCC) Active Problems:   Hyperlipidemia with target LDL less than 100   Essential hypertension   Atrial flutter (HCC)   GERD (gastroesophageal reflux disease)   Chronic diastolic congestive heart failure (HCC)   S/P minimally invasive mitral valve repair   Stroke (cerebrum) [facility])   Advance care planning   Goals of care, counseling/discussion   Palliative care by specialist    Discharge Instructions   Allergies as of 01/27/2018      Reactions   Oxycodone-acetaminophen Anxiety, Other (See Comments)   Hallucinations   Aspirin Nausea And Vomiting      Medication List    STOP taking these medications   acetaminophen 500 MG tablet Commonly known as:  TYLENOL   amiodarone 200 MG tablet Commonly known as:  PACERONE   ferrous sulfate 325 (65 FE) MG tablet   hydrochlorothiazide 25 MG tablet Commonly known as:  HYDRODIURIL   warfarin 2.5 MG tablet Commonly known as:  COUMADIN     TAKE these medications   atorvastatin 40 MG tablet Commonly known as:  LIPITOR Take 1 tablet (40 mg total) by mouth at bedtime.       Allergies  Allergen Reactions  . Oxycodone-Acetaminophen Anxiety and Other (See Comments)    Hallucinations  . Aspirin Nausea And Vomiting    Consultations:  Neurology  CT surgery  Cardiology  Palliative medicine  Procedures/Studies: Ct Angio Head W Or Wo Contrast  Result Date: 01/24/2018 CLINICAL DATA:  77 y/o  F; acute stroke for follow-up. EXAM: CT ANGIOGRAPHY HEAD AND NECK CT PERFUSION BRAIN TECHNIQUE: Multidetector CT imaging of the head and neck was performed using the standard protocol during bolus administration of intravenous contrast. Multiplanar CT image reconstructions and MIPs were obtained to evaluate the vascular  anatomy. Carotid stenosis measurements (when applicable) are obtained utilizing NASCET criteria, using the distal internal carotid diameter as the denominator. Multiphase CT imaging of the brain was performed following IV bolus contrast injection. Subsequent parametric perfusion maps were calculated using RAPID software. CONTRAST:  142mL ISOVUE-370 IOPAMIDOL (ISOVUE-370) INJECTION 76% COMPARISON:  01/24/2018 CT head. FINDINGS: CTA NECK FINDINGS Aortic arch: Bovine variant branching. Imaged portion shows no evidence of aneurysm or dissection. No significant stenosis of the major arch vessel origins. Mild calcific atherosclerosis. Right carotid system: No evidence of dissection, stenosis (50% or greater) or occlusion. Left carotid system: No evidence of dissection, stenosis (50% or greater) or occlusion. Vertebral arteries: Codominant. No evidence of dissection, stenosis (50% or greater) or occlusion. Skeleton: Moderate cervical spondylosis with multilevel disc and facet degenerative changes greatest at the C4-C7 levels. No high-grade bony canal stenosis. Other neck: Negative. Upper chest: Small right pleural effusion. Review of the MIP images confirms the above findings CTA HEAD FINDINGS Anterior circulation: Proximal left M1 occlusion with thrombus extending into the terminus with intermediate left MCA distribution collateralization. The thrombus appears approximately 8 mm in length (series 9, image 89). Mild calcific atherosclerosis of carotid siphons. Patent right MCA and bilateral ACA distributions with no additional large vessel occlusion, aneurysm, or significant stenosis. Posterior circulation: No significant stenosis, proximal occlusion, aneurysm, or vascular malformation. Venous sinuses: As permitted by contrast timing, patent. Anatomic variants: Right fetal PCA.  Complete circle-of-Willis. Review of the MIP images confirms the above findings CT Brain Perfusion Findings: CBF (<30%) Volume: 69mL Perfusion  (Tmax>6.0s) volume: 131mL Mismatch Volume: 63mL Infarction Location:Left MCA distribution. IMPRESSION: 1. Left proximal M1 occlusion with thrombus measuring approximately 8 mm in length. Intermediate collaterals. 2. Left MCA distribution infarction with perfusion CT calculated core of 37 cc corresponding to the infarct on noncontrast CT. The calculated core may slightly underestimate the actual core as the region of infarct visible on noncontrast CT (ASPECTS 4) may have areas of pseudonormalization. 3. Large perfusion penumbra, 78 cc. 4. Patent carotid and vertebral arteries in the neck. No significant stenosis by NASCET criteria, dissection, or aneurysm. 5. Patent right MCA, bilateral ACA, and bilateral PCA distributions. These results were called by telephone at the time of interpretation on 01/24/2018 at 1:12 pm to Dr. Meryl Crutch , who verbally acknowledged these results. Electronically Signed   By: Kristine Garbe M.D.   On: 01/24/2018 13:25   Dg Chest 2 View  Result Date: 01/13/2018 CLINICAL DATA:  Pneumothorax. EXAM: CHEST - 2 VIEW COMPARISON:  Yesterday FINDINGS: Stable cardiomegaly. Status post mitral valve repair. Trace right pneumothorax is unchanged. Streaky opacities in the low volume lungs, but improved from prior. Stable chest wall gas on the right. IMPRESSION: 1. Stable trace right pneumothorax. 2. Improved lung volumes. Electronically Signed   By: Monte Fantasia M.D.   On: 01/13/2018 09:44   Dg Chest 2 View  Result Date: 01/12/2018 CLINICAL DATA:  Status post open heart surgery EXAM: CHEST - 2 VIEW COMPARISON:  01/09/2018 FINDINGS: Postsurgical changes are again seen. The thoracostomy catheter and pericardial drain have been removed in the interval. Tiny right apical  pneumothorax is noted. Minimal pleural effusions are noted. Left jugular sheath has been removed as well. Minimal basilar atelectasis is noted. IMPRESSION: Minimal basilar atelectasis and effusions. Slight increase in  right pneumothorax following tube removal although minimal. These results will be called to the ordering clinician or representative by the Radiologist Assistant, and communication documented in the PACS or zVision Dashboard. Electronically Signed   By: Inez Catalina M.D.   On: 01/12/2018 07:54   Dg Chest 2 View  Result Date: 01/05/2018 CLINICAL DATA:  MVR surgery. EXAM: CHEST - 2 VIEW COMPARISON:  CT 11/30/2017. FINDINGS: Cardiomegaly with mild pulmonary vascular prominence. No focal infiltrate. No pleural effusion or pneumothorax. Degenerative changes thoracic spine. IMPRESSION: Cardiomegaly with mild pulmonary vascular prominence. No evidence of overt CHF. No focal infiltrate. Electronically Signed   By: Marcello Moores  Register   On: 01/05/2018 16:12   Ct Head Wo Contrast  Result Date: 01/24/2018 CLINICAL DATA:  Follow up stroke. History of hypertension, hyperlipidemia, neuroendocrine tumor. EXAM: CT HEAD WITHOUT CONTRAST TECHNIQUE: Contiguous axial images were obtained from the base of the skull through the vertex without intravenous contrast. COMPARISON:  CT/CTA HEAD January 24, 2018. FINDINGS: BRAIN: No intraparenchymal hemorrhage, mass effect nor midline shift. Hypodense LEFT basal ganglia, LEFT insular ribbon sign, loss of LEFT frontotemporal gray-white matter junction. Old LEFT cerebellar infarcts. No parenchymal brain volume loss for age. No hydrocephalus. No abnormal extra-axial fluid collections. Basal cisterns are patent. VASCULAR: Moderate calcific atherosclerosis of the carotid siphons. Dense LEFT insular MCA. SKULL: No skull fracture. Occluded RIGHT facial soft tissue swelling without subcutaneous gas or radiopaque foreign bodies. SINUSES/ORBITS: The mastoid air-cells and included paranasal sinuses are well-aerated.The included ocular globes and orbital contents are non-suspicious. Status post bilateral ocular lens implants. OTHER: None. IMPRESSION: 1. Evolving acute nonhemorrhagic LEFT MCA territory  infarct. Dense LEFT MCA consistent with thromboembolism. 2. Old LEFT cerebellar infarct. Electronically Signed   By: Elon Alas M.D.   On: 01/24/2018 23:32   Ct Head Wo Contrast  Result Date: 01/24/2018 CLINICAL DATA:  Altered level of consciousness. History of neuroendocrine tumor EXAM: CT HEAD WITHOUT CONTRAST TECHNIQUE: Contiguous axial images were obtained from the base of the skull through the vertex without intravenous contrast. COMPARISON:  None. FINDINGS: Brain: Ill-defined hypodensity left MCA territory compatible with acute infarct. This involves the frontal and temporal lobe as well as some of the left parietal lobe. There is infarct involving the insula and basal ganglia. No associated hemorrhage. No midline shift. Ventricle size normal. Chronic infarct left cerebellum. Chronic microvascular ischemic change in the white matter. Vascular: Hyperdense distal left internal carotid artery and left MCA compatible with acute thrombosis. Skull: Negative Sinuses/Orbits: Paranasal sinuses clear.  Bilateral cataract surgery Other: None ASPECTS (Lyman Stroke Program Early CT Score) - Ganglionic level infarction (caudate, lentiform nuclei, internal capsule, insula, M1-M3 cortex): 2 - Supraganglionic infarction (M4-M6 cortex): 2 Total score (0-10 with 10 being normal): 4 IMPRESSION: 1. Large territory acute left MCA infarct without hemorrhage. Hyperdense left internal carotid artery and left MCA compatible with acute thrombosis. 2. ASPECTS is 4 3. These results were called by telephone at the time of interpretation on 01/24/2018 at 11:50 am to Dr. Virgel Manifold , who verbally acknowledged these results. Electronically Signed   By: Franchot Gallo M.D.   On: 01/24/2018 11:50   Ct Angio Neck W Or Wo Contrast  Result Date: 01/24/2018 CLINICAL DATA:  77 y/o  F; acute stroke for follow-up. EXAM: CT ANGIOGRAPHY HEAD AND NECK CT PERFUSION BRAIN  TECHNIQUE: Multidetector CT imaging of the head and neck was  performed using the standard protocol during bolus administration of intravenous contrast. Multiplanar CT image reconstructions and MIPs were obtained to evaluate the vascular anatomy. Carotid stenosis measurements (when applicable) are obtained utilizing NASCET criteria, using the distal internal carotid diameter as the denominator. Multiphase CT imaging of the brain was performed following IV bolus contrast injection. Subsequent parametric perfusion maps were calculated using RAPID software. CONTRAST:  142mL ISOVUE-370 IOPAMIDOL (ISOVUE-370) INJECTION 76% COMPARISON:  01/24/2018 CT head. FINDINGS: CTA NECK FINDINGS Aortic arch: Bovine variant branching. Imaged portion shows no evidence of aneurysm or dissection. No significant stenosis of the major arch vessel origins. Mild calcific atherosclerosis. Right carotid system: No evidence of dissection, stenosis (50% or greater) or occlusion. Left carotid system: No evidence of dissection, stenosis (50% or greater) or occlusion. Vertebral arteries: Codominant. No evidence of dissection, stenosis (50% or greater) or occlusion. Skeleton: Moderate cervical spondylosis with multilevel disc and facet degenerative changes greatest at the C4-C7 levels. No high-grade bony canal stenosis. Other neck: Negative. Upper chest: Small right pleural effusion. Review of the MIP images confirms the above findings CTA HEAD FINDINGS Anterior circulation: Proximal left M1 occlusion with thrombus extending into the terminus with intermediate left MCA distribution collateralization. The thrombus appears approximately 8 mm in length (series 9, image 89). Mild calcific atherosclerosis of carotid siphons. Patent right MCA and bilateral ACA distributions with no additional large vessel occlusion, aneurysm, or significant stenosis. Posterior circulation: No significant stenosis, proximal occlusion, aneurysm, or vascular malformation. Venous sinuses: As permitted by contrast timing, patent.  Anatomic variants: Right fetal PCA.  Complete circle-of-Willis. Review of the MIP images confirms the above findings CT Brain Perfusion Findings: CBF (<30%) Volume: 62mL Perfusion (Tmax>6.0s) volume: 139mL Mismatch Volume: 45mL Infarction Location:Left MCA distribution. IMPRESSION: 1. Left proximal M1 occlusion with thrombus measuring approximately 8 mm in length. Intermediate collaterals. 2. Left MCA distribution infarction with perfusion CT calculated core of 37 cc corresponding to the infarct on noncontrast CT. The calculated core may slightly underestimate the actual core as the region of infarct visible on noncontrast CT (ASPECTS 4) may have areas of pseudonormalization. 3. Large perfusion penumbra, 78 cc. 4. Patent carotid and vertebral arteries in the neck. No significant stenosis by NASCET criteria, dissection, or aneurysm. 5. Patent right MCA, bilateral ACA, and bilateral PCA distributions. These results were called by telephone at the time of interpretation on 01/24/2018 at 1:12 pm to Dr. Meryl Crutch , who verbally acknowledged these results. Electronically Signed   By: Kristine Garbe M.D.   On: 01/24/2018 13:25   Mr Brain Wo Contrast  Result Date: 01/25/2018 CLINICAL DATA:  Follow up stroke. History of hypertension, hyperlipidemia and neuroendocrine tumor. EXAM: MRI HEAD WITHOUT CONTRAST TECHNIQUE: Multiplanar, multiecho pulse sequences of the brain and surrounding structures were obtained without intravenous contrast. COMPARISON:  CT HEAD January 24, 2018 FINDINGS: INTRACRANIAL CONTENTS: Confluent reduced diffusion LEFT frontoparietal and temporal lobes including insula, reduced diffusion LEFT basal ganglia. Subcentimeter discontinuous reduced diffusion LEFT parietal lobes. Areas of reduced diffusion demonstrate low ADC values, RIGHT FLAIR T2 hyperintense signal. Speckled susceptibility artifact LEFT frontal parietal lobes with additional chronic microhemorrhages bilateral occipital lobes and  RIGHT parietal lobe. 2 mm LEFT-to-RIGHT midline shift. Patchy supratentorial white matter FLAIR T2 hyperintensities exclusive of the aforementioned abnormality compatible with mild chronic small vessel ischemic disease. Old LEFT cerebellar infarct. No parenchymal brain volume loss for age. No hydrocephalus. No abnormal extra-axial fluid collections. VASCULAR: Normal major intracranial vascular  flow voids present at skull base. SKULL AND UPPER CERVICAL SPINE: No abnormal sellar expansion. No suspicious calvarial bone marrow signal. Craniocervical junction maintained. SINUSES/ORBITS: The mastoid air-cells and included paranasal sinuses are well-aerated.The included ocular globes and orbital contents are non-suspicious. Status post bilateral ocular lens implant. OTHER: Patient is edentulous. IMPRESSION: 1. 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 2 mm LEFT-to-RIGHT midline shift. 2. Susceptibility artifact seen with old PRES, chronic hypertension, less likely amyloid angiopathy. 3. Old LEFT cerebellar infarct. Electronically Signed   By: Elon Alas M.D.   On: 01/25/2018 06:10   Ct Cerebral Perfusion W Contrast  Result Date: 01/24/2018 CLINICAL DATA:  77 y/o  F; acute stroke for follow-up. EXAM: CT ANGIOGRAPHY HEAD AND NECK CT PERFUSION BRAIN TECHNIQUE: Multidetector CT imaging of the head and neck was performed using the standard protocol during bolus administration of intravenous contrast. Multiplanar CT image reconstructions and MIPs were obtained to evaluate the vascular anatomy. Carotid stenosis measurements (when applicable) are obtained utilizing NASCET criteria, using the distal internal carotid diameter as the denominator. Multiphase CT imaging of the brain was performed following IV bolus contrast injection. Subsequent parametric perfusion maps were calculated using RAPID software. CONTRAST:  164mL ISOVUE-370 IOPAMIDOL (ISOVUE-370) INJECTION 76% COMPARISON:  01/24/2018 CT head.  FINDINGS: CTA NECK FINDINGS Aortic arch: Bovine variant branching. Imaged portion shows no evidence of aneurysm or dissection. No significant stenosis of the major arch vessel origins. Mild calcific atherosclerosis. Right carotid system: No evidence of dissection, stenosis (50% or greater) or occlusion. Left carotid system: No evidence of dissection, stenosis (50% or greater) or occlusion. Vertebral arteries: Codominant. No evidence of dissection, stenosis (50% or greater) or occlusion. Skeleton: Moderate cervical spondylosis with multilevel disc and facet degenerative changes greatest at the C4-C7 levels. No high-grade bony canal stenosis. Other neck: Negative. Upper chest: Small right pleural effusion. Review of the MIP images confirms the above findings CTA HEAD FINDINGS Anterior circulation: Proximal left M1 occlusion with thrombus extending into the terminus with intermediate left MCA distribution collateralization. The thrombus appears approximately 8 mm in length (series 9, image 89). Mild calcific atherosclerosis of carotid siphons. Patent right MCA and bilateral ACA distributions with no additional large vessel occlusion, aneurysm, or significant stenosis. Posterior circulation: No significant stenosis, proximal occlusion, aneurysm, or vascular malformation. Venous sinuses: As permitted by contrast timing, patent. Anatomic variants: Right fetal PCA.  Complete circle-of-Willis. Review of the MIP images confirms the above findings CT Brain Perfusion Findings: CBF (<30%) Volume: 16mL Perfusion (Tmax>6.0s) volume: 152mL Mismatch Volume: 3mL Infarction Location:Left MCA distribution. IMPRESSION: 1. Left proximal M1 occlusion with thrombus measuring approximately 8 mm in length. Intermediate collaterals. 2. Left MCA distribution infarction with perfusion CT calculated core of 37 cc corresponding to the infarct on noncontrast CT. The calculated core may slightly underestimate the actual core as the region of  infarct visible on noncontrast CT (ASPECTS 4) may have areas of pseudonormalization. 3. Large perfusion penumbra, 78 cc. 4. Patent carotid and vertebral arteries in the neck. No significant stenosis by NASCET criteria, dissection, or aneurysm. 5. Patent right MCA, bilateral ACA, and bilateral PCA distributions. These results were called by telephone at the time of interpretation on 01/24/2018 at 1:12 pm to Dr. Meryl Crutch , who verbally acknowledged these results. Electronically Signed   By: Kristine Garbe M.D.   On: 01/24/2018 13:25   Dg Chest Port 1 View  Result Date: 01/27/2018 CLINICAL DATA:  History of previous CVA. Follow-up atelectasis and small right pneumothorax. EXAM: PORTABLE  CHEST 1 VIEW COMPARISON:  PA and lateral chest x-ray of January 13, 2018 FINDINGS: The lungs are adequately inflated. No right-sided pneumothorax is observed. There is a small right pleural effusion. The left lung is mildly hyperinflated. The retrocardiac lung markings are increased. The cardiac silhouette is enlarged. The pulmonary vascularity is engorged. There is a prosthetic valve in the mitral position. There is calcification in the wall of the aortic arch. IMPRESSION: CHF superimposed upon COPD. Small right pleural effusion. Probable bibasilar atelectasis or pneumonia. No pneumothorax. Thoracic aortic atherosclerosis. Electronically Signed   By: David  Martinique M.D.   On: 01/27/2018 08:18   Dg Chest Port 1 View  Result Date: 01/09/2018 CLINICAL DATA:  Followup atelectasis. Status post cardiac surgery and valve replacement. EXAM: PORTABLE CHEST 1 VIEW COMPARISON:  01/08/2018 and older exams. FINDINGS: Cardiac silhouette remains enlarged.  No mediastinal widening. Swan-Ganz catheter has been removed. Left introducer sheath is stable and well positioned. Right chest tube is stable.  No pneumothorax. Streaky opacities noted in the right mid lung. There is more confluent opacity at the lung bases, left greater than right.  This is likely atelectasis, and is stable from the prior exam. No pulmonary edema. IMPRESSION: 1. No evidence of an operative complication. No pneumothorax or pulmonary edema. No mediastinal widening. 2. Remaining support apparatus is well positioned. 3. Right mid lung and bilateral lung base opacities most likely atelectasis. Electronically Signed   By: Lajean Manes M.D.   On: 01/09/2018 08:14   Dg Chest Port 1 View  Result Date: 01/08/2018 CLINICAL DATA:  Status postextubation. EXAM: PORTABLE CHEST 1 VIEW COMPARISON:  Yesterday. FINDINGS: The endotracheal tube has been removed. The left jugular Swan-Ganz catheter tip is in the proximal right main pulmonary artery. Stable enlarged cardiac silhouette and prosthetic heart valve. Bilateral chest tubes remain in place with no pneumothorax seen. Increased left lower lobe airspace opacity. Decreased patchy and linear density in the right lung. Minimal right pleural fluid. Thoracic spine degenerative changes. IMPRESSION: 1. Increased left lower lobe atelectasis or pneumonia. 2. Improving right lung atelectasis and possible pneumonia. 3. Minimal right pleural effusion. 4. Stable cardiomegaly. Electronically Signed   By: Claudie Revering M.D.   On: 01/08/2018 08:46   Dg Chest Port 1 View  Result Date: 01/07/2018 CLINICAL DATA:  Cardiac valve replacement. EXAM: PORTABLE CHEST 1 VIEW COMPARISON:  01/05/2018. FINDINGS: Endotracheal tube tip noted 1.4 cm above the carina, proximal repositioning of approximately 2 cm may prove useful. NG tube noted with tip below left hemidiaphragm. Swan-Ganz catheter tip noted over the right main pulmonary artery. Right chest noted with tip over the right upper chest. Prior cardiac valve surgery. Cardiomegaly. Patchy infiltrates noted throughout the right lung suggesting pneumonia. Aspiration cannot be excluded. Asymmetric pulmonary edema cannot be excluded. Small right pleural effusion may be present. No pneumothorax. No acute bony  abnormality. IMPRESSION: 1. Endotracheal tube tip noted 1.4 cm above the carina, proximal repositioning of approximately 2 cm may prove useful. NG tube noted with tip below left hemidiaphragm. Swan-Ganz catheter tip noted over the right main pulmonary artery. Right chest tube noted with tip over the upper chest. No pneumothorax. 2. Patchy pulmonary infiltrates noted throughout the right lung suggesting pneumonia. Aspiration cannot be excluded. 3.  Cardiomegaly.  Prior cardiac valve replacement. Electronically Signed   By: Marcello Moores  Register   On: 01/07/2018 13:52  Echo    - Left ventricle: Abnormal septal motion Systolic function was   normal. The estimated ejection fraction was in the  range of 50%   to 55%. The study is not technically sufficient to allow   evaluation of LV diastolic function. - Aortic valve: Valve area (VTI): 3.31 cm^2. Valve area (Vmax):   3.77 cm^2. Valve area (Vmean): 3.33 cm^2. 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 Trivial   residual MR with somwwhat tight repair and functional MS mean   gradient 7 mmHg peak 20 mmHg. Valve area by continuity equation   (using LVOT flow): 1.63 cm^2. - Left atrium: The atrium was moderately dilated. - Right atrium: The atrium was moderately dilated. - Atrial septum: No defect or patent foramen ovale was identified. - Tricuspid valve: There was mild-moderate regurgitation. - Pulmonary arteries: PA peak pressure: 60 mm Hg (S).    Subjective: Denies headache, chest pain, dyspnea, orthopnea, leg swelling.  Able to nod and shake head, but can only say "yes" or "yeah" to questions, no other verbalizations.    Discharge Exam: Vitals:   01/27/18 0729 01/27/18 1112  BP: (!) 170/69 (!) 179/68  Pulse: 79 74  Resp: 20 20  Temp: 99 F (37.2 C) 98.6 F (37 C)  SpO2: 97% 99%   Vitals:   01/26/18 2357 01/27/18 0410 01/27/18 0729 01/27/18 1112  BP: (!) 152/59 (!) 157/68 (!) 170/69 (!) 179/68  Pulse: 66 73 79 74  Resp: 18 18 20  20   Temp: 98.6 F (37 C) 98.8 F (37.1 C) 99 F (37.2 C) 98.6 F (37 C)  TempSrc: Oral Oral Oral Oral  SpO2: 98% 99% 97% 99%  Weight:      Height:        General: Pt is alert, awake, not in acute distress, sitting up in chair, eating lunch Cardiovascular: RRR, S1/S2 +, no rubs, no gallops Respiratory: CTA bilaterally, no wheezing, no rhonchi Abdominal: Soft, NT, ND, bowel sounds + Extremities: no edema, no cyanosis Neuro: Left hemiparesis.  Right facial droop.  Aphasia.    The results of significant diagnostics from this hospitalization (including imaging, microbiology, ancillary and laboratory) are listed below for reference.     Microbiology: No results found for this or any previous visit (from the past 240 hour(s)).   Labs: BNP (last 3 results) No results for input(s): BNP in the last 8760 hours. Basic Metabolic Panel: Recent Labs  Lab 01/24/18 1207 01/24/18 1209 01/25/18 0618  NA 139 140 140  K 4.4 4.2 4.2  CL 108 108 111  CO2  --  21* 18*  GLUCOSE 104* 107* 73  BUN 23* 25* 17  CREATININE 1.70* 1.84* 1.47*  CALCIUM  --  8.9 8.6*   Liver Function Tests: Recent Labs  Lab 01/24/18 1209  AST 32  ALT 28  ALKPHOS 61  BILITOT 0.6  PROT 7.0  ALBUMIN 3.3*   No results for input(s): LIPASE, AMYLASE in the last 168 hours. No results for input(s): AMMONIA in the last 168 hours. CBC: Recent Labs  Lab 01/24/18 1207 01/24/18 1209 01/25/18 0618  WBC  --  11.4* 8.0  NEUTROABS  --  9.3*  --   HGB 10.2* 10.1* 10.8*  HCT 30.0* 31.0* 34.1*  MCV  --  98.1 99.1  PLT  --  346 323   Cardiac Enzymes: No results for input(s): CKTOTAL, CKMB, CKMBINDEX, TROPONINI in the last 168 hours. BNP: Invalid input(s): POCBNP CBG: No results for input(s): GLUCAP in the last 168 hours. D-Dimer No results for input(s): DDIMER in the last 72 hours. Hgb A1c Recent Labs    01/25/18 0618  HGBA1C 5.4   Lipid Profile Recent Labs    01/25/18 0618  CHOL 140  HDL 47   LDLCALC 75  TRIG 89  CHOLHDL 3.0   Thyroid function studies No results for input(s): TSH, T4TOTAL, T3FREE, THYROIDAB in the last 72 hours.  Invalid input(s): FREET3 Anemia work up No results for input(s): VITAMINB12, FOLATE, FERRITIN, TIBC, IRON, RETICCTPCT in the last 72 hours. Urinalysis    Component Value Date/Time   COLORURINE YELLOW 01/24/2018 1327   APPEARANCEUR CLEAR 01/24/2018 1327   LABSPEC 1.032 (H) 01/24/2018 1327   PHURINE 5.0 01/24/2018 1327   GLUCOSEU NEGATIVE 01/24/2018 1327   HGBUR NEGATIVE 01/24/2018 1327   BILIRUBINUR NEGATIVE 01/24/2018 1327   BILIRUBINUR negative 09/19/2014 0938   KETONESUR NEGATIVE 01/24/2018 1327   PROTEINUR NEGATIVE 01/24/2018 1327   UROBILINOGEN negative 09/19/2014 0938   UROBILINOGEN 1.0 05/09/2010 1224   NITRITE NEGATIVE 01/24/2018 1327   LEUKOCYTESUR NEGATIVE 01/24/2018 1327   Sepsis Labs Invalid input(s): PROCALCITONIN,  WBC,  LACTICIDVEN Microbiology No results found for this or any previous visit (from the past 240 hour(s)).   Time coordinating discharge: 35 minutes       SIGNED:   Edwin Dada, MD  [facility] 01/27/2018, 3:42 PM

## 2018-01-27 NOTE — Progress Notes (Signed)
Pt transferred to unit via bed. Family at bedside. Oriented to room and unit. Call bell placed and side rails up x 3. No c/o pain noted. R sided weakness. Abrasion to R side of face as result of fall during stroke at home. Dentures upper and lower in place. Will cont to monitor.  K Blessings Inglett LPN

## 2018-01-27 NOTE — Progress Notes (Deleted)
Meredith Staggers, MD      Meredith Staggers, MD  Physician  Physical Medicine and Rehabilitation      Consult Note  Signed     Date of Service:  01/26/2018  6:11 AM         Related encounter: ED to Hosp-Admission (Current) from 01/24/2018 in [place] [facility]             Signed          Expand All Collapse All            Expand widget buttonCollapse widget button    Show:Clear all   ManualTemplateCopied  Added by:     Angiulli, Lavon Paganini, PA-C  Meredith Staggers, MD   Hover for detailscustomization button                                                                                                                                           untitled image              Physical Medicine and Rehabilitation Consult  Reason for Consult: Right side weakness  Referring Physician: Triad        HPI: [name] is a 77 y.o. right-handed female with complex medical history of mitral valve disease status post mitral valve repair 01/07/2018 per Dr. Roxy Manns and was discharged on Coumadin on 01/14/2018, hypertension, hyperlipidemia.  Presented to [facility] 01/24/2018 after being found down by family with right sided weakness and aphasia.  Per chart review patient lives with daughter.  Patient had been independent prior to admission without need for assistive device.  Cranial CT scan showed large territory acute left MCA infarction without hemorrhage.  Hyperdense left internal carotid artery left MCA compatible with acute thrombosis.  Patient was discharged to [facility] for ongoing care.  INR on admission of 1.7.  CT angiogram of head and neck showed left proximal M1 occlusion with thrombus measuring approximately 8 mm in length.  Large perfusion  penumbra 78 cc.  Echocardiogram with ejection fraction of 55%.  Systolic function was normal.  MRI of the brain 01/25/2017 showed large left MCA territory infarct with petechial hemorrhage.  2 mm left to right midline shift.  Old left cerebellar infarction.  Cardiology follow-up no current plan for TEE.  Coumadin currently on hold due to large stroke and await plan to resume anticoagulation.  Mechanical soft diet with thin liquids.  Physical and occupational therapy evaluations completed with recommendations of physical medicine rehab consult.     Review of Systems   Unable to perform ROS: Language           Past Medical History:    Diagnosis   Date    .   Atrial flutter (  [place])            Typical by EKG diagnosis 9/11 s/p CIT ablation 11/11    .   Bradycardia        .   Chronic diastolic congestive heart failure ([place])        .   DJD (degenerative joint disease)        .   Dysrhythmia        .   Femur fracture, left ([place])   2013    .   Heart murmur        .   Hyperlipidemia            x5 years    .   Hypertension            Since 1997    .   Liver masses   11/13/2017        Multiple small nodules seen on CT and MRI    .   Mitral regurgitation            severe    .   Pancreatic mass   11/13/2017    .   S/P minimally invasive mitral valve repair   01/07/2018        Complex valvuloplasty including artificial Gore-tex neochord placement x6 and 28 mm Sorin Memo 4D ring annuloplasty via right mini thoracotomy approach    .   TR (tricuspid regurgitation)            Mild with RA enlargment             Past Surgical History:    Procedure   Laterality   Date    .   A FLUTTER ABLATION   N/A   06/27/2011        Procedure: ABLATION A FLUTTER;  Surgeon: Thompson Grayer, MD;  Location: [facility] CATH LAB;  Service: Cardiovascular;  Laterality: N/A;    .   ATRIAL  ABLATION SURGERY       06/2010    .   HIP SURGERY                Left (fracture) 3/13    .   JOINT REPLACEMENT                both knees     .   KNEE ARTHROSCOPY                Right    .   LUMBAR SPINE SURGERY            .   MITRAL VALVE REPAIR   Right   01/07/2018        Procedure: MINIMALLY INVASIVE MITRAL VALVE REPAIR using LivaNova ring size 28 MM;  Surgeon: Rexene Alberts, MD;  Location: [place];  Service: Open Heart Surgery;  Laterality: Right;    .   PATENT FORAMEN OVALE(PFO) CLOSURE   N/A   01/07/2018        Procedure: PATENT FORAMEN OVALE (PFO) CLOSURE;  Surgeon: Rexene Alberts, MD;  Location: [place];  Service: Open Heart Surgery;  Laterality: N/A;    .   RIGHT/LEFT HEART CATH AND CORONARY ANGIOGRAPHY   N/A   11/11/2017        Procedure: RIGHT/LEFT HEART CATH AND CORONARY ANGIOGRAPHY;  Surgeon: Sherren Mocha, MD;  Location: [place] CV LAB;  Service: Cardiovascular;  Laterality: N/A;    .   TEE WITHOUT CARDIOVERSION   N/A  09/18/2017        Procedure: TRANSESOPHAGEAL ECHOCARDIOGRAM (TEE);  Surgeon: Skeet Latch, MD;  Location: [place];  Service: Cardiovascular;  Laterality: N/A;    .   TEE WITHOUT CARDIOVERSION   N/A   01/07/2018        Procedure: TRANSESOPHAGEAL ECHOCARDIOGRAM (TEE);  Surgeon: Rexene Alberts, MD;  Location: [place];  Service: Open Heart Surgery;  Laterality: N/A;    .   TOTAL KNEE ARTHROPLASTY                Left    .   TUBAL LIGATION                     Family History    Problem   Relation   Age of Onset    .   Hypertension   Mother        .   Cancer   Sister                leukemia    .   Diabetes   Brother        .   Stroke   Sister        .   Diabetes   Brother        .   Heart attack   Brother        .   Healthy   Daughter        .   Healthy    Son        .   Healthy   Daughter           Social History:  reports that she has never smoked. She has never used smokeless tobacco. She reports that she does not drink alcohol or use drugs.  Allergies:         Allergies    Allergen   Reactions    .   Oxycodone-Acetaminophen   Anxiety and Other (See Comments)            Hallucinations    .   Aspirin   Nausea And Vomiting              Medications Prior to Admission    Medication   Sig   Dispense   Refill    .   acetaminophen (TYLENOL) 500 MG tablet   Take 1,000 mg by mouth every 6 (six) hours as needed for moderate pain or headache.            Marland Kitchen   amiodarone (PACERONE) 200 MG tablet   Take 1 tablet (200 mg total) by mouth daily.   30 tablet   1    .   atorvastatin (LIPITOR) 40 MG tablet   Take 1 tablet (40 mg total) by mouth at bedtime.   90 tablet   1    .   ferrous sulfate 325 (65 FE) MG tablet   Take 1 tablet (325 mg total) by mouth daily with breakfast. For one month then stop.   30 tablet   3    .   hydrochlorothiazide (HYDRODIURIL) 25 MG tablet   Take 1 tablet (25 mg total) by mouth daily.   30 tablet   1    .   warfarin (COUMADIN) 2.5 MG tablet   Take 1 tablet (2.5 mg total) by mouth daily at 6 PM. Or as directed   30 tablet   0  Home:  Home Living  Family/patient expects to be discharged to:: Private residence  Living Arrangements: Children, Other relatives  Available Help at Discharge: Family, Available 24 hours/day  Type of Home: Mobile home  Home Access: Stairs to enter  Entrance Stairs-Number of Steps: 3  Entrance Stairs-Rails: None  Home Layout: One level  Bathroom Shower/Tub: Administrator, Civil Service: Standard  Home Equipment: None   Functional History:  Prior Function  Level of Independence: Independent  Comments: independent up until cardiac surgery (~2 weeks  ago)  Functional Status:   Mobility:  Bed Mobility  Overal bed mobility: Needs Assistance  Bed Mobility: Rolling, Supine to Sit  Rolling: Max assist  Supine to sit: +2 for safety/equipment, Mod assist  General bed mobility comments: verbal cuing and manual facilitation for sequence and hand placement. Assistance for R LE and trunk  Transfers  Overall transfer level: Needs assistance  Equipment used: None  Transfers: Sit to/from Stand, Stand Pivot Transfers  Sit to Stand: Mod assist, +2 physical assistance  Stand pivot transfers: Mod assist, +2 physical assistance  General transfer comment: Mod A + 2 due to R LE weakness and reduced intentional motion. Does attempt to weight shift and slide/shuffle R LE         ADL:  ADL  Overall ADL's : Needs assistance/impaired  Grooming: Wash/dry hands, Wash/dry face, Oral care, Minimal assistance, Supervision/safety, Set up, Sitting  Upper Body Bathing: Cueing for sequencing, Maximal assistance  Lower Body Bathing: Total assistance  Upper Body Dressing : Maximal assistance  Lower Body Dressing: Total assistance  Toilet Transfer: +2 for safety/equipment  Toileting- Clothing Manipulation and Hygiene: Total assistance  Functional mobility during ADLs: Moderate assistance, +2 for physical assistance     Cognition:  Cognition  Overall Cognitive Status: Impaired/Different from baseline  Arousal/Alertness: Awake/alert  Orientation Level: Oriented to person, Oriented to place, Disoriented to time, Disoriented to situation  Attention: Sustained, Selective  Sustained Attention: Appears intact  Selective Attention: Appears intact  Memory: (UTA)  Awareness: Impaired  Awareness Impairment: Intellectual impairment, Emergent impairment  Problem Solving: Impaired  Problem Solving Impairment: Verbal basic, Functional basic  Cognition  Arousal/Alertness: Awake/alert  Overall Cognitive Status:  Impaired/Different from baseline  Area of Impairment: Orientation, Attention, Following commands, Safety/judgement, Problem solving  Orientation Level: Disoriented to, Place, Time, Situation  Current Attention Level: Focused  Following Commands: Follows one step commands inconsistently, Follows one step commands with increased time  Safety/Judgement: Decreased awareness of safety, Decreased awareness of deficits  Problem Solving: Slow processing, Decreased initiation, Difficulty sequencing, Requires verbal cues, Requires tactile cues     Blood pressure (!) 143/83, pulse 74, temperature 99.6 F (37.6 C), temperature source Oral, resp. rate (!) 27, height 5\' 3"  (1.6 m), weight 76.5 kg (168 lb 10.4 oz), last menstrual period 11/05/1992, SpO2 95 %.  Physical Exam   Constitutional: She appears well-developed.   HENT:   Head: Normocephalic.  Missing teeth, numerous fillings. Abrasion right face   Eyes: Pupils are equal, round, and reactive to light. Conjunctivae are normal.   Neck: Normal range of motion.   Cardiovascular: Normal rate.   Respiratory: Effort normal.   GI: Soft.  Musculoskeletal: She exhibits no edema.  Neurological: She is alert.  Right central 7 and tongue deviation. Non-verbal except for occasional garbled speech. RUE without movement except with pain provocation. RLE the same. Motor planning deficits/apraxia. Senses pain in all 4 limbs. Moves LUE and LLE spontaneously  Psychiatric:  Pleasant, smiling  Lab Results Last 24 Hours                                                                                                                                                                                                                                                                                                                                                                                                                                                                                      Imaging Results (Last 48 hours)                                                                         Assessment/Plan:  Diagnosis: 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 Yes   2.Co-Morbidities requiring supervision/potential  complications: aflutter, chf, htn, post-stroke sequelae   3.Due to bladder management, bowel management, safety, skin/wound care, disease management, medication administration and patient education, does the patient require 24 hr/day rehab nursing? Yes   4.Does the patient require coordinated care of a physician, rehab nurse, PT (1-2 hrs/day, 5 days/week), OT (1-2 hrs/day, 5 days/week) and SLP (1-2 hrs/day, 5 days/week) to address physical and functional deficits in the context of the above medical diagnosis(es)? Yes Addressing deficits in the following areas: balance, endurance, locomotion, strength, transferring, bowel/bladder control, bathing, dressing, feeding, grooming, toileting, cognition, speech, language, swallowing and psychosocial support   5.Can the patient actively participate in an intensive therapy program of at least 3 hrs of therapy per day at least 5 days per week? Yes   6.The potential for patient to make measurable gains while on inpatient rehab is excellent   7.Anticipated functional outcomes upon discharge from inpatient rehab  are supervision and min assist  with PT, supervision and min assist with OT, supervision, min assist and mod assist with SLP.   8.Estimated rehab length of stay to reach the above functional goals is: 20-25 days   9.Anticipated D/C setting: Home   10.Anticipated post D/C treatments: HH therapy and Outpatient therapy   11.Overall Rehab/Functional Prognosis: excellent      RECOMMENDATIONS:  This patient's condition is appropriate for continued rehabilitative care in the following setting: CIR  Patient has agreed to participate in recommended program. Yes  Note that insurance prior authorization may be required for reimbursement for recommended care.     Comment: Rehab Admissions Coordinator to follow up.     Thanks,     Meredith Staggers, MD, Mellody Drown     I have personally performed a face to face diagnostic evaluation of this patient. Additionally, I have reviewed and concur with the physician assistant's documentation above.          Cathlyn Parsons, PA-C  01/26/2018                Revision History                                        Routing History                                  Meredith Staggers, MD      Meredith Staggers, MD  Physician  Physical Medicine and Rehabilitation      Consult Note  Signed     Date of Service:  01/26/2018  6:11 AM         Related encounter: ED to Hosp-Admission (Current) from 01/24/2018 in [place] [facility]             Signed          Expand All Collapse All            Expand widget buttonCollapse widget button    Show:Clear all   ManualTemplateCopied  Added by:     Cathlyn Parsons, PA-C  Meredith Staggers, MD   Hover for detailscustomization  button  untitled image              Physical Medicine and Rehabilitation Consult  Reason for Consult: Right side weakness  Referring Physician: Triad        HPI: Brooke Chavez is a 77 y.o. right-handed female with complex medical history of mitral valve disease status post mitral valve repair 01/07/2018 per Dr. Roxy Manns and was discharged on Coumadin on 01/14/2018, hypertension, hyperlipidemia.  Presented to [facility] 01/24/2018 after being found down by family with right sided weakness and aphasia.  Per chart review patient lives with daughter.  Patient had been independent prior to admission without need for assistive device.  Cranial CT scan showed large territory acute left MCA infarction without hemorrhage.  Hyperdense left internal carotid artery left MCA compatible with acute thrombosis.  Patient was discharged to [facility] for ongoing care.  INR on admission of 1.7.  CT angiogram of head and neck showed left proximal M1 occlusion with thrombus measuring approximately 8 mm in length.  Large perfusion penumbra 78 cc.  Echocardiogram with ejection fraction of 55%.  Systolic function was normal.  MRI of the brain 01/25/2017 showed large left MCA territory infarct with petechial hemorrhage.  2 mm left to right midline shift.  Old left cerebellar infarction.  Cardiology follow-up no current plan for TEE.  Coumadin currently on hold due to large stroke and await plan to resume anticoagulation.  Mechanical soft diet with thin liquids.  Physical and occupational therapy evaluations completed with recommendations of physical medicine rehab consult.     Review of Systems   Unable to perform ROS: Language            Past Medical History:    Diagnosis   Date    .   Atrial flutter ([place])            Typical by EKG diagnosis 9/11 s/p CIT ablation 11/11    .   Bradycardia        .   Chronic diastolic congestive heart failure ([place])        .   DJD (degenerative joint disease)        .   Dysrhythmia        .   Femur fracture, left ([place])   2013    .   Heart murmur        .   Hyperlipidemia            x5 years    .   Hypertension            Since 1997    .   Liver masses   11/13/2017        Multiple small nodules seen on CT and MRI    .   Mitral regurgitation            severe    .   Pancreatic mass   11/13/2017    .   S/P minimally invasive mitral valve repair   01/07/2018        Complex valvuloplasty including artificial Gore-tex neochord placement x6 and 28 mm Sorin Memo 4D ring annuloplasty via right mini thoracotomy approach    .   TR (tricuspid regurgitation)            Mild with RA enlargment             Past Surgical History:    Procedure   Laterality   Date    .  A FLUTTER ABLATION   N/A   06/27/2011        Procedure: ABLATION A FLUTTER;  Surgeon: Thompson Grayer, MD;  Location: [facility] CATH LAB;  Service: Cardiovascular;  Laterality: N/A;    .   ATRIAL ABLATION SURGERY       06/2010    .   HIP SURGERY                Left (fracture) 3/13    .   JOINT REPLACEMENT                both knees     .   KNEE ARTHROSCOPY                Right    .   LUMBAR SPINE SURGERY            .   MITRAL VALVE REPAIR   Right   01/07/2018        Procedure: MINIMALLY INVASIVE MITRAL VALVE REPAIR using LivaNova ring size 28 MM;  Surgeon: Rexene Alberts, MD;  Location: [place];  Service: Open Heart Surgery;  Laterality: Right;    .   PATENT FORAMEN OVALE(PFO) CLOSURE   N/A   01/07/2018        Procedure: PATENT  FORAMEN OVALE (PFO) CLOSURE;  Surgeon: Rexene Alberts, MD;  Location: [place];  Service: Open Heart Surgery;  Laterality: N/A;    .   RIGHT/LEFT HEART CATH AND CORONARY ANGIOGRAPHY   N/A   11/11/2017        Procedure: RIGHT/LEFT HEART CATH AND CORONARY ANGIOGRAPHY;  Surgeon: Sherren Mocha, MD;  Location: [place] CV LAB;  Service: Cardiovascular;  Laterality: N/A;    .   TEE WITHOUT CARDIOVERSION   N/A   09/18/2017        Procedure: TRANSESOPHAGEAL ECHOCARDIOGRAM (TEE);  Surgeon: Skeet Latch, MD;  Location: [place];  Service: Cardiovascular;  Laterality: N/A;    .   TEE WITHOUT CARDIOVERSION   N/A   01/07/2018        Procedure: TRANSESOPHAGEAL ECHOCARDIOGRAM (TEE);  Surgeon: Rexene Alberts, MD;  Location: [place];  Service: Open Heart Surgery;  Laterality: N/A;    .   TOTAL KNEE ARTHROPLASTY                Left    .   TUBAL LIGATION                     Family History    Problem   Relation   Age of Onset    .   Hypertension   Mother        .   Cancer   Sister                leukemia    .   Diabetes   Brother        .   Stroke   Sister        .   Diabetes   Brother        .   Heart attack   Brother        .   Healthy   Daughter        .   Healthy   Son        .   Healthy   Daughter           Social History:  reports that she has never smoked.  She has never used smokeless tobacco. She reports that she does not drink alcohol or use drugs.  Allergies:         Allergies    Allergen   Reactions    .   Oxycodone-Acetaminophen   Anxiety and Other (See Comments)            Hallucinations    .   Aspirin   Nausea And Vomiting              Medications Prior to Admission    Medication   Sig   Dispense   Refill    .   acetaminophen (TYLENOL) 500 MG tablet   Take 1,000 mg by mouth every 6 (six) hours  as needed for moderate pain or headache.            Marland Kitchen   amiodarone (PACERONE) 200 MG tablet   Take 1 tablet (200 mg total) by mouth daily.   30 tablet   1    .   atorvastatin (LIPITOR) 40 MG tablet   Take 1 tablet (40 mg total) by mouth at bedtime.   90 tablet   1    .   ferrous sulfate 325 (65 FE) MG tablet   Take 1 tablet (325 mg total) by mouth daily with breakfast. For one month then stop.   30 tablet   3    .   hydrochlorothiazide (HYDRODIURIL) 25 MG tablet   Take 1 tablet (25 mg total) by mouth daily.   30 tablet   1    .   warfarin (COUMADIN) 2.5 MG tablet   Take 1 tablet (2.5 mg total) by mouth daily at 6 PM. Or as directed   30 tablet   0          Home:  Home Living  Family/patient expects to be discharged to:: Private residence  Living Arrangements: Children, Other relatives  Available Help at Discharge: Family, Available 24 hours/day  Type of Home: Mobile home  Home Access: Stairs to enter  Entrance Stairs-Number of Steps: 3  Entrance Stairs-Rails: None  Home Layout: One level  Bathroom Shower/Tub: Administrator, Civil Service: Standard  Home Equipment: None   Functional History:  Prior Function  Level of Independence: Independent  Comments: independent up until cardiac surgery (~2 weeks ago)  Functional Status:   Mobility:  Bed Mobility  Overal bed mobility: Needs Assistance  Bed Mobility: Rolling, Supine to Sit  Rolling: Max assist  Supine to sit: +2 for safety/equipment, Mod assist  General bed mobility comments: verbal cuing and manual facilitation for sequence and hand placement. Assistance for R LE and trunk  Transfers  Overall transfer level: Needs assistance  Equipment used: None  Transfers: Sit to/from Stand, Stand Pivot Transfers  Sit to Stand: Mod assist, +2 physical assistance  Stand pivot transfers: Mod assist, +2 physical assistance  General transfer comment:  Mod A + 2 due to R LE weakness and reduced intentional motion. Does attempt to weight shift and slide/shuffle R LE         ADL:  ADL  Overall ADL's : Needs assistance/impaired  Grooming: Wash/dry hands, Wash/dry face, Oral care, Minimal assistance, Supervision/safety, Set up, Sitting  Upper Body Bathing: Cueing for sequencing, Maximal assistance  Lower Body Bathing: Total assistance  Upper Body Dressing : Maximal assistance  Lower Body Dressing: Total assistance  Toilet Transfer: +2 for safety/equipment  Toileting- Clothing Manipulation and Hygiene: Total assistance  Functional mobility during ADLs:  Moderate assistance, +2 for physical assistance     Cognition:  Cognition  Overall Cognitive Status: Impaired/Different from baseline  Arousal/Alertness: Awake/alert  Orientation Level: Oriented to person, Oriented to place, Disoriented to time, Disoriented to situation  Attention: Sustained, Selective  Sustained Attention: Appears intact  Selective Attention: Appears intact  Memory: (UTA)  Awareness: Impaired  Awareness Impairment: Intellectual impairment, Emergent impairment  Problem Solving: Impaired  Problem Solving Impairment: Verbal basic, Functional basic  Cognition  Arousal/Alertness: Awake/alert  Overall Cognitive Status: Impaired/Different from baseline  Area of Impairment: Orientation, Attention, Following commands, Safety/judgement, Problem solving  Orientation Level: Disoriented to, Place, Time, Situation  Current Attention Level: Focused  Following Commands: Follows one step commands inconsistently, Follows one step commands with increased time  Safety/Judgement: Decreased awareness of safety, Decreased awareness of deficits  Problem Solving: Slow processing, Decreased initiation, Difficulty sequencing, Requires verbal cues, Requires tactile cues     Blood pressure (!) 143/83, pulse 74, temperature 99.6 F (37.6 C), temperature  source Oral, resp. rate (!) 27, height 5\' 3"  (1.6 m), weight 76.5 kg (168 lb 10.4 oz), last menstrual period 11/05/1992, SpO2 95 %.  Physical Exam   Constitutional: She appears well-developed.   HENT:   Head: Normocephalic.  Missing teeth, numerous fillings. Abrasion right face   Eyes: Pupils are equal, round, and reactive to light. Conjunctivae are normal.   Neck: Normal range of motion.   Cardiovascular: Normal rate.   Respiratory: Effort normal.   GI: Soft.  Musculoskeletal: She exhibits no edema.  Neurological: She is alert.  Right central 7 and tongue deviation. Non-verbal except for occasional garbled speech. RUE without movement except with pain provocation. RLE the same. Motor planning deficits/apraxia. Senses pain in all 4 limbs. 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 Yes   2.Co-Morbidities requiring supervision/potential complications: aflutter, chf, htn, post-stroke sequelae   3.Due to bladder management, bowel management, safety, skin/wound care, disease management, medication administration and patient education, does the patient require 24 hr/day rehab nursing? Yes   4.Does the patient require coordinated care of a physician, rehab nurse, PT (1-2 hrs/day, 5 days/week), OT (1-2 hrs/day, 5 days/week) and SLP (1-2 hrs/day, 5 days/week) to address physical and functional deficits in the context of the above medical diagnosis(es)? Yes Addressing deficits in the following areas: balance, endurance, locomotion, strength, transferring, bowel/bladder control, bathing, dressing, feeding, grooming, toileting, cognition, speech, language, swallowing and psychosocial support   5.Can the patient actively participate in an intensive therapy program of at least 3 hrs of therapy per day at least 5 days per week? Yes   6.The potential for patient to make measurable gains while on inpatient rehab is excellent   7.Anticipated functional outcomes upon discharge from inpatient rehab are supervision and min assist  with PT, supervision and min assist with OT, supervision, min assist and mod assist with SLP.   8.Estimated rehab length of stay to reach the above functional goals is: 20-25 days   9.Anticipated D/C setting: Home   10.Anticipated post D/C treatments: HH therapy and Outpatient therapy   11.Overall Rehab/Functional Prognosis: excellent      RECOMMENDATIONS:  This patient's condition is appropriate for continued rehabilitative care in the following setting: CIR  Patient has agreed to participate in recommended program. Yes  Note that insurance prior authorization may be required  for reimbursement for recommended care.     Comment: Rehab Admissions Coordinator to follow up.     Thanks,     Meredith Staggers, MD, [facility]     I have personally performed a face to face diagnostic evaluation of this patient. Additionally, I have reviewed and concur with the physician assistant's documentation above.          Lavon Paganini Angiulli, PA-C  01/26/2018                Revision History                                        Routing History

## 2018-01-27 NOTE — Progress Notes (Addendum)
Inpatient Rehabilitation Admissions Coordinator  I met with patient, son and daughter at bedside. They are in agreement to admit to CIR today. I contacted Dr. Loleta Books and he is in agreement to admit. I will notify RN CM and SW and make the arrangements to admit.   Danne Baxter, RN, MSN Rehab Admissions Coordinator [phone] 01/27/2018 10:12 AM

## 2018-01-27 NOTE — Progress Notes (Signed)
Patient had 2 silk sutures from previous chest tubes. Both sutures were removed. The anterior suture was "air knotted" so wound was open. Dry 4x4s with tape applied.

## 2018-01-27 NOTE — Care Management Note (Signed)
Case Management Note  Patient Details  Name: Brooke Chavez MRN: [id number] Date of Birth: 1940/11/10  Subjective/Objective:                    Action/Plan: Pt discharging to CIR today. CM signing off.   Expected Discharge Date:  01/28/18               Expected Discharge Plan:  IP Rehab Facility  In-House Referral:  Clinical Social Work  Discharge planning Services  CM Consult  Post Acute Care Choice:    Choice offered to:     DME Arranged:    DME Agency:     HH Arranged:    [place] Agency:     Status of Service:  Completed, signed off  If discussed at [employer], dates discussed:    Additional Comments:  Pollie Friar, RN 01/27/2018, 12:21 PM

## 2018-01-27 NOTE — Progress Notes (Signed)
Physical Medicine and Rehabilitation Consult Reason for Consult: Right side weakness Referring Physician: [facility]   HPI: Brooke Chavez is a 77 y.o. right-handed female with complex medical history of mitral valve disease status post mitral valve repair 01/07/2018 per Dr. Roxy Manns and was discharged on Coumadin on 01/14/2018, hypertension, hyperlipidemia.  Presented to [facility] 01/24/2018 after being found down by family with right sided weakness and aphasia.  Per chart review patient lives with daughter.  Patient had been independent prior to admission without need for assistive device.  Cranial CT scan showed large territory acute left MCA infarction without hemorrhage.  Hyperdense left internal carotid artery left MCA compatible with acute thrombosis.  Patient was discharged to [facility] for ongoing care.  INR on admission of 1.7.  CT angiogram of head and neck showed left proximal M1 occlusion with thrombus measuring approximately 8 mm in length.  Large perfusion penumbra 78 cc.  Echocardiogram with ejection fraction of 55%.  Systolic function was normal.  MRI of the brain 01/25/2017 showed large left MCA territory infarct with petechial hemorrhage.  2 mm left to right midline shift.  Old left cerebellar infarction.  Cardiology follow-up no current plan for TEE.  Coumadin currently on hold due to large stroke and await plan to resume anticoagulation.  Mechanical soft diet with thin liquids.  Physical and occupational therapy evaluations completed with recommendations of physical medicine rehab consult.  Review of Systems  Unable to perform ROS: Language       Past Medical History:  Diagnosis Date  . Atrial flutter ([place])    Typical by EKG diagnosis 9/11 s/p CIT ablation 11/11  . Bradycardia   . Chronic diastolic congestive heart failure ([place])   . DJD (degenerative joint disease)   . Dysrhythmia   . Femur fracture, left ([place]) 2013  . Heart murmur   .  Hyperlipidemia    x5 years  . Hypertension    Since 1997  . Liver masses 11/13/2017   Multiple small nodules seen on CT and MRI  . Mitral regurgitation    severe  . 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 TR (tricuspid regurgitation)    Mild with RA enlargment        Past Surgical History:  Procedure Laterality Date  . A FLUTTER ABLATION N/A 06/27/2011   Procedure: ABLATION A FLUTTER;  Surgeon: Thompson Grayer, MD;  Location: [facility] CATH LAB;  Service: Cardiovascular;  Laterality: N/A;  . ATRIAL ABLATION SURGERY  06/2010  . HIP SURGERY     Left (fracture) 3/13  . JOINT REPLACEMENT     both knees   . KNEE ARTHROSCOPY     Right  . LUMBAR SPINE SURGERY    . MITRAL VALVE REPAIR Right 01/07/2018   Procedure: MINIMALLY INVASIVE MITRAL VALVE REPAIR using LivaNova ring size 28 MM;  Surgeon: Rexene Alberts, MD;  Location: [place];  Service: Open Heart Surgery;  Laterality: Right;  . PATENT FORAMEN OVALE(PFO) CLOSURE N/A 01/07/2018   Procedure: PATENT FORAMEN OVALE (PFO) CLOSURE;  Surgeon: Rexene Alberts, MD;  Location: [place];  Service: Open Heart Surgery;  Laterality: N/A;  . RIGHT/LEFT HEART CATH AND CORONARY ANGIOGRAPHY N/A 11/11/2017   Procedure: RIGHT/LEFT HEART CATH AND CORONARY ANGIOGRAPHY;  Surgeon: Sherren Mocha, MD;  Location: [facility]  INVASIVE CV LAB;  Service: Cardiovascular;  Laterality: N/A;  . TEE WITHOUT CARDIOVERSION N/A 09/18/2017   Procedure: TRANSESOPHAGEAL ECHOCARDIOGRAM (TEE);  Surgeon: Skeet Latch, MD;  Location: [place];  Service: Cardiovascular;  Laterality: N/A;  . TEE WITHOUT CARDIOVERSION N/A 01/07/2018   Procedure: TRANSESOPHAGEAL ECHOCARDIOGRAM (TEE);  Surgeon: Rexene Alberts, MD;  Location: [place];  Service: Open Heart Surgery;  Laterality:  N/A;  . TOTAL KNEE ARTHROPLASTY     Left  . TUBAL LIGATION          Family History  Problem Relation Age of Onset  . Hypertension Mother   . Cancer Sister        leukemia  . Diabetes Brother   . Stroke Sister   . Diabetes Brother   . Heart attack Brother   . Healthy Daughter   . Healthy Son   . Healthy Daughter    Social History:  reports that she has never smoked. She has never used smokeless tobacco. She reports that she does not drink alcohol or use drugs. Allergies:       Allergies  Allergen Reactions  . Oxycodone-Acetaminophen Anxiety and Other (See Comments)    Hallucinations  . Aspirin Nausea And Vomiting         Medications Prior to Admission  Medication Sig Dispense Refill  . acetaminophen (TYLENOL) 500 MG tablet Take 1,000 mg by mouth every 6 (six) hours as needed for moderate pain or headache.    Marland Kitchen amiodarone (PACERONE) 200 MG tablet Take 1 tablet (200 mg total) by mouth daily. 30 tablet 1  . atorvastatin (LIPITOR) 40 MG tablet Take 1 tablet (40 mg total) by mouth at bedtime. 90 tablet 1  . ferrous sulfate 325 (65 FE) MG tablet Take 1 tablet (325 mg total) by mouth daily with breakfast. For one month then stop. 30 tablet 3  . hydrochlorothiazide (HYDRODIURIL) 25 MG tablet Take 1 tablet (25 mg total) by mouth daily. 30 tablet 1  . warfarin (COUMADIN) 2.5 MG tablet Take 1 tablet (2.5 mg total) by mouth daily at 6 PM. Or as directed 30 tablet 0    Home: Home Living Family/patient expects to be discharged to:: Private residence Living Arrangements: Children, Other relatives Available Help at Discharge: Family, Available 24 hours/day Type of Home: Mobile home Home Access: Stairs to enter Entrance Stairs-Number of Steps: 3 Entrance Stairs-Rails: None Home Layout: One level Bathroom Shower/Tub: Chiropodist: Standard Home Equipment: None  Functional History: Prior Function Level of Independence:  Independent Comments: independent up until cardiac surgery (~2 weeks ago) Functional Status:  Mobility: Bed Mobility Overal bed mobility: Needs Assistance Bed Mobility: Rolling, Supine to Sit Rolling: Max assist Supine to sit: +2 for safety/equipment, Mod assist General bed mobility comments: verbal cuing and manual facilitation for sequence and hand placement. Assistance for R LE and trunk Transfers Overall transfer level: Needs assistance Equipment used: None Transfers: Sit to/from Stand, Stand Pivot Transfers Sit to Stand: Mod assist, +2 physical assistance Stand pivot transfers: Mod assist, +2 physical assistance General transfer comment: Mod A + 2 due to R LE weakness and reduced intentional motion. Does attempt to weight shift and slide/shuffle R LE  ADL: ADL Overall ADL's : Needs assistance/impaired Grooming: Wash/dry hands, Wash/dry face, Oral care, Minimal assistance, Supervision/safety, Set up, Sitting Upper Body Bathing: Cueing for sequencing, Maximal assistance Lower Body Bathing: Total assistance Upper Body Dressing : Maximal assistance Lower Body Dressing: Total assistance Toilet Transfer: +2 for safety/equipment Toileting- Clothing Manipulation and  Hygiene: Total assistance Functional mobility during ADLs: Moderate assistance, +2 for physical assistance  Cognition: Cognition Overall Cognitive Status: Impaired/Different from baseline Arousal/Alertness: Awake/alert Orientation Level: Oriented to person, Oriented to place, Disoriented to time, Disoriented to situation Attention: Sustained, Selective Sustained Attention: Appears intact Selective Attention: Appears intact Memory: (UTA) Awareness: Impaired Awareness Impairment: Intellectual impairment, Emergent impairment Problem Solving: Impaired Problem Solving Impairment: Verbal basic, Functional basic Cognition Arousal/Alertness: Awake/alert Overall Cognitive Status: Impaired/Different from baseline Area  of Impairment: Orientation, Attention, Following commands, Safety/judgement, Problem solving Orientation Level: Disoriented to, Place, Time, Situation Current Attention Level: Focused Following Commands: Follows one step commands inconsistently, Follows one step commands with increased time Safety/Judgement: Decreased awareness of safety, Decreased awareness of deficits Problem Solving: Slow processing, Decreased initiation, Difficulty sequencing, Requires verbal cues, Requires tactile cues  Blood pressure (!) 143/83, pulse 74, temperature 99.6 F (37.6 C), temperature source Oral, resp. rate (!) 27, height 5\' 3"  (1.6 m), weight 76.5 kg (168 lb 10.4 oz), last menstrual period 11/05/1992, SpO2 95 %. Physical Exam  Constitutional: She appears well-developed.  HENT:  Head: Normocephalic.  Missing teeth, numerous fillings. Abrasion right face  Eyes: Pupils are equal, round, and reactive to light. Conjunctivae are normal.  Neck: Normal range of motion.  Cardiovascular: Normal rate.  Respiratory: Effort normal.  GI: Soft.  Musculoskeletal: She exhibits no edema.  Neurological: She is alert.  Right central 7 and tongue deviation. Non-verbal except for occasional garbled speech. RUE without movement except with pain provocation. RLE the same. Motor planning deficits/apraxia. Senses pain in all 4 limbs. Moves LUE and LLE spontaneously  Psychiatric:  Pleasant, smiling    LabResultsLast24Hours       Results for orders placed or performed during the hospital encounter of 01/24/18 (from the past 24 hour(s))  Hemoglobin A1c     Status: None   Collection Time: 01/25/18  6:18 AM  Result Value Ref Range   Hgb A1c MFr Bld 5.4 4.8 - 5.6 %   Mean Plasma Glucose 108.28 mg/dL  Lipid panel     Status: None   Collection Time: 01/25/18  6:18 AM  Result Value Ref Range   Cholesterol 140 0 - 200 mg/dL   Triglycerides 89 <150 mg/dL   HDL 47 >40 mg/dL   Total CHOL/HDL Ratio 3.0 RATIO    VLDL 18 0 - 40 mg/dL   LDL Cholesterol 75 0 - 99 mg/dL  Basic metabolic panel     Status: Abnormal   Collection Time: 01/25/18  6:18 AM  Result Value Ref Range   Sodium 140 135 - 145 mmol/L   Potassium 4.2 3.5 - 5.1 mmol/L   Chloride 111 101 - 111 mmol/L   CO2 18 (L) 22 - 32 mmol/L   Glucose, Bld 73 65 - 99 mg/dL   BUN 17 6 - 20 mg/dL   Creatinine, Ser 1.47 (H) 0.44 - 1.00 mg/dL   Calcium 8.6 (L) 8.9 - 10.3 mg/dL   GFR calc non Af Amer 33 (L) >60 mL/min   GFR calc Af Amer 39 (L) >60 mL/min   Anion gap 11 5 - 15  CBC     Status: Abnormal   Collection Time: 01/25/18  6:18 AM  Result Value Ref Range   WBC 8.0 4.0 - 10.5 K/uL   RBC 3.44 (L) 3.87 - 5.11 MIL/uL   Hemoglobin 10.8 (L) 12.0 - 15.0 g/dL   HCT 34.1 (L) 36.0 - 46.0 %   MCV 99.1 78.0 - 100.0 fL   MCH  31.4 26.0 - 34.0 pg   MCHC 31.7 30.0 - 36.0 g/dL   RDW 14.2 11.5 - 15.5 %   Platelets 323 150 - 400 K/uL      ImagingResults(Last48hours)  Ct Angio Head W Or Wo Contrast  Result Date: 01/24/2018 CLINICAL DATA:  77 y/o  F; acute stroke for follow-up. EXAM: CT ANGIOGRAPHY HEAD AND NECK CT PERFUSION BRAIN TECHNIQUE: Multidetector CT imaging of the head and neck was performed using the standard protocol during bolus administration of intravenous contrast. Multiplanar CT image reconstructions and MIPs were obtained to evaluate the vascular anatomy. Carotid stenosis measurements (when applicable) are obtained utilizing NASCET criteria, using the distal internal carotid diameter as the denominator. Multiphase CT imaging of the brain was performed following IV bolus contrast injection. Subsequent parametric perfusion maps were calculated using RAPID software. CONTRAST:  13mL ISOVUE-370 IOPAMIDOL (ISOVUE-370) INJECTION 76% COMPARISON:  01/24/2018 CT head. FINDINGS: CTA NECK FINDINGS Aortic arch: Bovine variant branching. Imaged portion shows no evidence of aneurysm or dissection. No significant stenosis of the  major arch vessel origins. Mild calcific atherosclerosis. Right carotid system: No evidence of dissection, stenosis (50% or greater) or occlusion. Left carotid system: No evidence of dissection, stenosis (50% or greater) or occlusion. Vertebral arteries: Codominant. No evidence of dissection, stenosis (50% or greater) or occlusion. Skeleton: Moderate cervical spondylosis with multilevel disc and facet degenerative changes greatest at the C4-C7 levels. No high-grade bony canal stenosis. Other neck: Negative. Upper chest: Small right pleural effusion. Review of the MIP images confirms the above findings CTA HEAD FINDINGS Anterior circulation: Proximal left M1 occlusion with thrombus extending into the terminus with intermediate left MCA distribution collateralization. The thrombus appears approximately 8 mm in length (series 9, image 89). Mild calcific atherosclerosis of carotid siphons. Patent right MCA and bilateral ACA distributions with no additional large vessel occlusion, aneurysm, or significant stenosis. Posterior circulation: No significant stenosis, proximal occlusion, aneurysm, or vascular malformation. Venous sinuses: As permitted by contrast timing, patent. Anatomic variants: Right fetal PCA.  Complete circle-of-Willis. Review of the MIP images confirms the above findings CT Brain Perfusion Findings: CBF (<30%) Volume: 66mL Perfusion (Tmax>6.0s) volume: 120mL Mismatch Volume: 31mL Infarction Location:Left MCA distribution. IMPRESSION: 1. Left proximal M1 occlusion with thrombus measuring approximately 8 mm in length. Intermediate collaterals. 2. Left MCA distribution infarction with perfusion CT calculated core of 37 cc corresponding to the infarct on noncontrast CT. The calculated core may slightly underestimate the actual core as the region of infarct visible on noncontrast CT (ASPECTS 4) may have areas of pseudonormalization. 3. Large perfusion penumbra, 78 cc. 4. Patent carotid and vertebral arteries  in the neck. No significant stenosis by NASCET criteria, dissection, or aneurysm. 5. Patent right MCA, bilateral ACA, and bilateral PCA distributions. These results were called by telephone at the time of interpretation on 01/24/2018 at 1:12 pm to Dr. Meryl Crutch , who verbally acknowledged these results. Electronically Signed   By: Kristine Garbe M.D.   On: 01/24/2018 13:25   Ct Head Wo Contrast  Result Date: 01/24/2018 CLINICAL DATA:  Follow up stroke. History of hypertension, hyperlipidemia, neuroendocrine tumor. EXAM: CT HEAD WITHOUT CONTRAST TECHNIQUE: Contiguous axial images were obtained from the base of the skull through the vertex without intravenous contrast. COMPARISON:  CT/CTA HEAD January 24, 2018. FINDINGS: BRAIN: No intraparenchymal hemorrhage, mass effect nor midline shift. Hypodense LEFT basal ganglia, LEFT insular ribbon sign, loss of LEFT frontotemporal gray-white matter junction. Old LEFT cerebellar infarcts. No parenchymal brain volume loss for age. No  hydrocephalus. No abnormal extra-axial fluid collections. Basal cisterns are patent. VASCULAR: Moderate calcific atherosclerosis of the carotid siphons. Dense LEFT insular MCA. SKULL: No skull fracture. Occluded RIGHT facial soft tissue swelling without subcutaneous gas or radiopaque foreign bodies. SINUSES/ORBITS: The mastoid air-cells and included paranasal sinuses are well-aerated.The included ocular globes and orbital contents are non-suspicious. Status post bilateral ocular lens implants. OTHER: None. IMPRESSION: 1. Evolving acute nonhemorrhagic LEFT MCA territory infarct. Dense LEFT MCA consistent with thromboembolism. 2. Old LEFT cerebellar infarct. Electronically Signed   By: Elon Alas M.D.   On: 01/24/2018 23:32   Ct Head Wo Contrast  Result Date: 01/24/2018 CLINICAL DATA:  Altered level of consciousness. History of neuroendocrine tumor EXAM: CT HEAD WITHOUT CONTRAST TECHNIQUE: Contiguous axial images were  obtained from the base of the skull through the vertex without intravenous contrast. COMPARISON:  None. FINDINGS: Brain: Ill-defined hypodensity left MCA territory compatible with acute infarct. This involves the frontal and temporal lobe as well as some of the left parietal lobe. There is infarct involving the insula and basal ganglia. No associated hemorrhage. No midline shift. Ventricle size normal. Chronic infarct left cerebellum. Chronic microvascular ischemic change in the white matter. Vascular: Hyperdense distal left internal carotid artery and left MCA compatible with acute thrombosis. Skull: Negative Sinuses/Orbits: Paranasal sinuses clear.  Bilateral cataract surgery Other: None ASPECTS ([place] Stroke Program Early CT Score) - Ganglionic level infarction (caudate, lentiform nuclei, internal capsule, insula, M1-M3 cortex): 2 - Supraganglionic infarction (M4-M6 cortex): 2 Total score (0-10 with 10 being normal): 4 IMPRESSION: 1. Large territory acute left MCA infarct without hemorrhage. Hyperdense left internal carotid artery and left MCA compatible with acute thrombosis. 2. ASPECTS is 4 3. These results were called by telephone at the time of interpretation on 01/24/2018 at 11:50 am to Dr. Virgel Manifold , who verbally acknowledged these results. Electronically Signed   By: Franchot Gallo M.D.   On: 01/24/2018 11:50   Ct Angio Neck W Or Wo Contrast  Result Date: 01/24/2018 CLINICAL DATA:  77 y/o  F; acute stroke for follow-up. EXAM: CT ANGIOGRAPHY HEAD AND NECK CT PERFUSION BRAIN TECHNIQUE: Multidetector CT imaging of the head and neck was performed using the standard protocol during bolus administration of intravenous contrast. Multiplanar CT image reconstructions and MIPs were obtained to evaluate the vascular anatomy. Carotid stenosis measurements (when applicable) are obtained utilizing NASCET criteria, using the distal internal carotid diameter as the denominator. Multiphase CT imaging of the  brain was performed following IV bolus contrast injection. Subsequent parametric perfusion maps were calculated using RAPID software. CONTRAST:  179mL ISOVUE-370 IOPAMIDOL (ISOVUE-370) INJECTION 76% COMPARISON:  01/24/2018 CT head. FINDINGS: CTA NECK FINDINGS Aortic arch: Bovine variant branching. Imaged portion shows no evidence of aneurysm or dissection. No significant stenosis of the major arch vessel origins. Mild calcific atherosclerosis. Right carotid system: No evidence of dissection, stenosis (50% or greater) or occlusion. Left carotid system: No evidence of dissection, stenosis (50% or greater) or occlusion. Vertebral arteries: Codominant. No evidence of dissection, stenosis (50% or greater) or occlusion. Skeleton: Moderate cervical spondylosis with multilevel disc and facet degenerative changes greatest at the C4-C7 levels. No high-grade bony canal stenosis. Other neck: Negative. Upper chest: Small right pleural effusion. Review of the MIP images confirms the above findings CTA HEAD FINDINGS Anterior circulation: Proximal left M1 occlusion with thrombus extending into the terminus with intermediate left MCA distribution collateralization. The thrombus appears approximately 8 mm in length (series 9, image 89). Mild calcific atherosclerosis of carotid siphons.  Patent right MCA and bilateral ACA distributions with no additional large vessel occlusion, aneurysm, or significant stenosis. Posterior circulation: No significant stenosis, proximal occlusion, aneurysm, or vascular malformation. Venous sinuses: As permitted by contrast timing, patent. Anatomic variants: Right fetal PCA.  Complete circle-of-Willis. Review of the MIP images confirms the above findings CT Brain Perfusion Findings: CBF (<30%) Volume: 69mL Perfusion (Tmax>6.0s) volume: 123mL Mismatch Volume: 64mL Infarction Location:Left MCA distribution. IMPRESSION: 1. Left proximal M1 occlusion with thrombus measuring approximately 8 mm in length.  Intermediate collaterals. 2. Left MCA distribution infarction with perfusion CT calculated core of 37 cc corresponding to the infarct on noncontrast CT. The calculated core may slightly underestimate the actual core as the region of infarct visible on noncontrast CT (ASPECTS 4) may have areas of pseudonormalization. 3. Large perfusion penumbra, 78 cc. 4. Patent carotid and vertebral arteries in the neck. No significant stenosis by NASCET criteria, dissection, or aneurysm. 5. Patent right MCA, bilateral ACA, and bilateral PCA distributions. These results were called by telephone at the time of interpretation on 01/24/2018 at 1:12 pm to Dr. Meryl Crutch , who verbally acknowledged these results. Electronically Signed   By: Kristine Garbe M.D.   On: 01/24/2018 13:25   Mr Brain Wo Contrast  Result Date: 01/25/2018 CLINICAL DATA:  Follow up stroke. History of hypertension, hyperlipidemia and neuroendocrine tumor. EXAM: MRI HEAD WITHOUT CONTRAST TECHNIQUE: Multiplanar, multiecho pulse sequences of the brain and surrounding structures were obtained without intravenous contrast. COMPARISON:  CT HEAD January 24, 2018 FINDINGS: INTRACRANIAL CONTENTS: Confluent reduced diffusion LEFT frontoparietal and temporal lobes including insula, reduced diffusion LEFT basal ganglia. Subcentimeter discontinuous reduced diffusion LEFT parietal lobes. Areas of reduced diffusion demonstrate low ADC values, RIGHT FLAIR T2 hyperintense signal. Speckled susceptibility artifact LEFT frontal parietal lobes with additional chronic microhemorrhages bilateral occipital lobes and RIGHT parietal lobe. 2 mm LEFT-to-RIGHT midline shift. Patchy supratentorial white matter FLAIR T2 hyperintensities exclusive of the aforementioned abnormality compatible with mild chronic small vessel ischemic disease. Old LEFT cerebellar infarct. No parenchymal brain volume loss for age. No hydrocephalus. No abnormal extra-axial fluid collections. VASCULAR:  Normal major intracranial vascular flow voids present at skull base. SKULL AND UPPER CERVICAL SPINE: No abnormal sellar expansion. No suspicious calvarial bone marrow signal. Craniocervical junction maintained. SINUSES/ORBITS: The mastoid air-cells and included paranasal sinuses are well-aerated.The included ocular globes and orbital contents are non-suspicious. Status post bilateral ocular lens implant. OTHER: Patient is edentulous. IMPRESSION: 1. 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 2 mm LEFT-to-RIGHT midline shift. 2. Susceptibility artifact seen with old PRES, chronic hypertension, less likely amyloid angiopathy. 3. Old LEFT cerebellar infarct. Electronically Signed   By: Elon Alas M.D.   On: 01/25/2018 06:10   Ct Cerebral Perfusion W Contrast  Result Date: 01/24/2018 CLINICAL DATA:  77 y/o  F; acute stroke for follow-up. EXAM: CT ANGIOGRAPHY HEAD AND NECK CT PERFUSION BRAIN TECHNIQUE: Multidetector CT imaging of the head and neck was performed using the standard protocol during bolus administration of intravenous contrast. Multiplanar CT image reconstructions and MIPs were obtained to evaluate the vascular anatomy. Carotid stenosis measurements (when applicable) are obtained utilizing NASCET criteria, using the distal internal carotid diameter as the denominator. Multiphase CT imaging of the brain was performed following IV bolus contrast injection. Subsequent parametric perfusion maps were calculated using RAPID software. CONTRAST:  169mL ISOVUE-370 IOPAMIDOL (ISOVUE-370) INJECTION 76% COMPARISON:  01/24/2018 CT head. FINDINGS: CTA NECK FINDINGS Aortic arch: Bovine variant branching. Imaged portion shows no evidence of aneurysm or dissection.  No significant stenosis of the major arch vessel origins. Mild calcific atherosclerosis. Right carotid system: No evidence of dissection, stenosis (50% or greater) or occlusion. Left carotid system: No evidence of dissection, stenosis  (50% or greater) or occlusion. Vertebral arteries: Codominant. No evidence of dissection, stenosis (50% or greater) or occlusion. Skeleton: Moderate cervical spondylosis with multilevel disc and facet degenerative changes greatest at the C4-C7 levels. No high-grade bony canal stenosis. Other neck: Negative. Upper chest: Small right pleural effusion. Review of the MIP images confirms the above findings CTA HEAD FINDINGS Anterior circulation: Proximal left M1 occlusion with thrombus extending into the terminus with intermediate left MCA distribution collateralization. The thrombus appears approximately 8 mm in length (series 9, image 89). Mild calcific atherosclerosis of carotid siphons. Patent right MCA and bilateral ACA distributions with no additional large vessel occlusion, aneurysm, or significant stenosis. Posterior circulation: No significant stenosis, proximal occlusion, aneurysm, or vascular malformation. Venous sinuses: As permitted by contrast timing, patent. Anatomic variants: Right fetal PCA.  Complete circle-of-Willis. Review of the MIP images confirms the above findings CT Brain Perfusion Findings: CBF (<30%) Volume: 80mL Perfusion (Tmax>6.0s) volume: 1100mL Mismatch Volume: 11mL Infarction Location:Left MCA distribution. IMPRESSION: 1. Left proximal M1 occlusion with thrombus measuring approximately 8 mm in length. Intermediate collaterals. 2. Left MCA distribution infarction with perfusion CT calculated core of 37 cc corresponding to the infarct on noncontrast CT. The calculated core may slightly underestimate the actual core as the region of infarct visible on noncontrast CT (ASPECTS 4) may have areas of pseudonormalization. 3. Large perfusion penumbra, 78 cc. 4. Patent carotid and vertebral arteries in the neck. No significant stenosis by NASCET criteria, dissection, or aneurysm. 5. Patent right MCA, bilateral ACA, and bilateral PCA distributions. These results were called by telephone at the time of  interpretation on 01/24/2018 at 1:12 pm to Dr. Meryl Crutch , who verbally acknowledged these results. Electronically Signed   By: Kristine Garbe M.D.   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 Does the need for close, 24 hr/day medical supervision in concert with the patient's rehab needs make it unreasonable for this patient to be served in a less intensive setting? Yes 2. Co-Morbidities requiring supervision/potential complications: aflutter, chf, htn, post-stroke sequelae 3. Due to bladder management, bowel management, safety, skin/wound care, disease management, medication administration and patient education, does the patient require 24 hr/day rehab nursing? Yes 4. Does the patient require coordinated care of a physician, rehab nurse, PT (1-2 hrs/day, 5 days/week), OT (1-2 hrs/day, 5 days/week) and SLP (1-2 hrs/day, 5 days/week) to address physical and functional deficits in the context of the above medical diagnosis(es)? Yes Addressing deficits in the following areas: balance, endurance, locomotion, strength, transferring, bowel/bladder control, bathing, dressing, feeding, grooming, toileting, cognition, speech, language, swallowing and psychosocial support 5. Can the patient actively participate in an intensive therapy program of at least 3 hrs of therapy per day at least 5 days per week? Yes 6. The potential for patient to make measurable gains while on inpatient rehab is excellent 7. Anticipated functional outcomes upon discharge from inpatient rehab are supervision and min assist  with PT, supervision and min assist with OT, supervision, min assist and mod assist with SLP. 8. Estimated rehab length of stay to reach the above functional goals is: 20-25 days 9. Anticipated D/C setting: Home 10. Anticipated post D/C treatments: HH therapy and Outpatient therapy 11. Overall Rehab/Functional Prognosis:  excellent  RECOMMENDATIONS: This patient's condition is appropriate for continued rehabilitative care in the following setting: CIR Patient has agreed to participate in recommended program. Yes Note that insurance prior authorization may be required for reimbursement for recommended care.  Comment: Rehab Admissions Coordinator to follow up.  Thanks,  Meredith Staggers, MD, Mellody Drown  I have personally performed a face to face diagnostic evaluation of this patient. Additionally, I have reviewed and concur with the physician assistant's documentation above.     Lavon Paganini Angiulli, PA-C 01/26/2018

## 2018-01-27 NOTE — Progress Notes (Signed)
Physical Therapy Treatment Patient Details Name: Brooke Chavez MRN: [id number] DOB: 14-Apr-1941 Today's Date: 01/27/2018    History of Present Illness 77 year old female who initially presented to [facility] on 01/24/2018 with aphasia, noted to have right-sided weakness, right hemianopsia, right facial droop and was found to have an acute ischemic MCA stroke in the setting of recent mitral valve repair. Patient was transferred to [facility] for possible endovascular intervention of the left M1 occlusion however, once transported to [facility], the patient's family decided not to pursue IR procedure given the increased risk of bleeding. Family at bedside deny patient having complaints of chest pain, palpitations, any recent infection, long distance travel, dizziness or syncope. Patient has no hx of tobacco or alcohol use with no prior history of stroke. Information obtained from daughter and son at bedside given pt current condition.     PT Comments    Patient continues to make progress toward PT goals. Pt required mod A +2 for safety for STS transfers and able to forward/backward steps with L LE in standing. Pt continues to demonstrate R side inattention but is beginning to use R UE more often without cues. Family present. Pt continues to be a good candidate for CIR.    Follow Up Recommendations  CIR;Supervision/Assistance - 24 hour     Equipment Recommendations  Other (comment)(TBD)    Recommendations for Other Services Rehab consult     Precautions / Restrictions Precautions Precautions: Fall Restrictions Weight Bearing Restrictions: No    Mobility  Bed Mobility Overal bed mobility: Needs Assistance Bed Mobility: Rolling;Sidelying to Sit Rolling: Min guard Sidelying to sit: Min assist;HOB elevated       General bed mobility comments: pt initiated and followed through with task with cue to sit up on EOB; pt used bed rail and attempting to push up through R UE; min A to elevate trunk into  sitting  Transfers Overall transfer level: Needs assistance Equipment used: 2 person hand held assist(gait belt ) Transfers: Sit to/from [facility] Sit to Stand: Mod assist;+2 safety/equipment Stand pivot transfers: Mod assist;+2 physical assistance       General transfer comment: stand pivot EOB to recliner and sit to stands X4 from recliner; pt required assistance with power up on R side and R knee blocked to prevent knee buckling  Ambulation/Gait             General Gait Details: with R knee blocked and assistance for balance/weight shifting pt able to take forward and backward steps with L foot   Stairs             Wheelchair Mobility    Modified Rankin (Stroke Patients Only) Modified Rankin (Stroke Patients Only) Pre-Morbid Rankin Score: No symptoms Modified Rankin: Severe disability     Balance Overall balance assessment: Needs assistance Sitting-balance support: Single extremity supported;Feet supported Sitting balance-Leahy Scale: Fair     Standing balance support: Bilateral upper extremity supported;During functional activity Standing balance-Leahy Scale: Poor                              Cognition Arousal/Alertness: Awake/alert Behavior During Therapy: Flat affect Overall Cognitive Status: Impaired/Different from baseline Area of Impairment: Attention;Following commands;Safety/judgement;Problem solving                   Current Attention Level: Sustained   Following Commands: Follows one step commands with increased time;Follows one step commands inconsistently Safety/Judgement: Decreased awareness of  safety;Decreased awareness of deficits   Problem Solving: Slow processing;Difficulty sequencing;Requires verbal cues;Requires tactile cues General Comments: R side inattention      Exercises Other Exercises Other Exercises: STS X4    General Comments General comments (skin integrity, edema, etc.): family  present      Pertinent Vitals/Pain Pain Assessment: Faces Faces Pain Scale: No hurt    Home Living                      Prior Function            PT Goals (current goals can now be found in the care plan section) Acute Rehab PT Goals Patient Stated Goal: none stated PT Goal Formulation: With patient/family Time For Goal Achievement: 02/08/18 Potential to Achieve Goals: Fair Progress towards PT goals: Progressing toward goals    Frequency    Min 4X/week      PT Plan Current plan remains appropriate    Co-evaluation              AM-PAC PT "6 Clicks" Daily Activity  Outcome Measure  Difficulty turning over in bed (including adjusting bedclothes, sheets and blankets)?: Unable Difficulty moving from lying on back to sitting on the side of the bed? : Unable Difficulty sitting down on and standing up from a chair with arms (e.g., wheelchair, bedside commode, etc,.)?: Unable Help needed moving to and from a bed to chair (including a wheelchair)?: A Lot Help needed walking in hospital room?: A Lot Help needed climbing 3-5 steps with a railing? : Total 6 Click Score: 8    End of Session Equipment Utilized During Treatment: Gait belt Activity Tolerance: Patient tolerated treatment well Patient left: in chair;with call bell/phone within reach;with family/visitor present;with chair alarm set Nurse Communication: Mobility status PT Visit Diagnosis: Unsteadiness on feet (R26.81);Other abnormalities of gait and mobility (R26.89);Muscle weakness (generalized) (M62.81);Hemiplegia and hemiparesis Hemiplegia - Right/Left: Right Hemiplegia - dominant/non-dominant: Dominant Hemiplegia - caused by: Cerebral infarction     Time: 1062-6948 PT Time Calculation (min) (ACUTE ONLY): 30 min  Charges:  $Therapeutic Exercise: 8-22 mins $Therapeutic Activity: 8-22 mins                    G Codes:       Earney Navy, PTA Pager: [phone]     Darliss Cheney 01/27/2018, 12:09 PM

## 2018-01-27 NOTE — Progress Notes (Signed)
    Brief Cardiology f/u -patient was discharged to inpatient rehab today before I could see her.  Final notes from neurology had recommended restarting anticoagulation following repeat CT scan 1 week after stroke to evaluate for hemorrhagic transformation.  The recommendation was to convert to Eliquis.  However this patient has just had mitral valve surgery.  Will review with CT surgeon, however discussing with cardiac pharmacists, would recommend actually continuing warfarin.  She was not failed warfarin simply not yet therapeutic at the time of her stroke.    We will confirm final recommendations after discussing with CT surgery, however most likely the better option would be to restart warfarin as opposed to Eliquis.   Glenetta Hew, M.D., M.S. Interventional Cardiologist   Pager # [phone] Phone # [phone] [address] [place], [place] [address]

## 2018-01-27 NOTE — H&P (Signed)
Physical Medicine and Rehabilitation Admission H&P        Chief Complaint  Patient presents with  . Altered Mental Status  : HPI: Brooke Chavez is a 77 year old right-handed female with complex medical history of mitral valve disease/atrial flutter status post mitral valve repair 01/07/2018 per Dr. Roxy Manns and was discharged on Coumadin 01/14/2018 as well as history of hypertension, chronic diastolic congestive heart failure and hyperlipidemia.  Presented to [facility] 01/24/2018 after being found down by family with right sided weakness and aphasia.  Per chart review patient lives with daughter.  Had been independent prior to admission without need for assistive device.  Cranial CT scan showed large territory acute left MCA infarction without hemorrhage.  Hyperdense left internal carotid artery left MCA compatible with acute thrombosis.  Patient was discharged to [facility] for ongoing care.  INR on admission of 1.7.  CT angiogram of head and neck showed left proximal M1 occlusion with thrombus measuring approximately 8 mm in length.  Large perfusion penumbra 78 cc.  Interventional radiology consulted no plan for IR intervention due to high risk of bleeding.  Echocardiogram with ejection fraction of 55%.  Systolic function was normal.  No PFO seen.  MRI of the brain 01/25/2017 showed large left MCA territory infarction with petechial hemorrhage and a 2 mm left to right midline shift.  Old left cerebellar infarction.  Cardiology service follow-up no plan for TEE.  Chronic Coumadin currently remains on hold due to high risk of hemorrhage as per neurology services and plan will be to begin Eliquis in place of Coumadin 1 week post stroke if repeat CT does not show signs of hemmorhagic trasnformation.  Final note from Cardiology recommends Warfarin rather than Eliquis given that this is valvular ht disease associated with aflutter. Physical and occupational therapy evaluations completed with  recommendations of physical medicine rehab consult.  Patient was admitted for a comprehensive rehab program.   Review of Systems  Unable to perform ROS: Language        Past Medical History:  Diagnosis Date  . Atrial flutter ([place])      Typical by EKG diagnosis 9/11 s/p CIT ablation 11/11  . Bradycardia    . Chronic diastolic congestive heart failure ([place])    . DJD (degenerative joint disease)    . Dysrhythmia    . Femur fracture, left ([place]) 2013  . Heart murmur    . Hyperlipidemia      x5 years  . Hypertension      Since 1997  . Liver masses 11/13/2017    Multiple small nodules seen on CT and MRI  . Mitral regurgitation      severe  . 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    Complex valvuloplasty including artificial Gore-tex neochord placement x6 and 28 mm Sorin Memo 4D ring annuloplasty via right mini thoracotomy approach  . TR (tricuspid regurgitation)      Mild with RA enlargment         Past Surgical History:  Procedure Laterality Date  . A FLUTTER ABLATION N/A 06/27/2011    Procedure: ABLATION A FLUTTER;  Surgeon: Thompson Grayer, MD;  Location: [facility] CATH LAB;  Service: Cardiovascular;  Laterality: N/A;  . ATRIAL ABLATION SURGERY   06/2010  . HIP SURGERY        Left (fracture) 3/13  . JOINT REPLACEMENT        both knees   . KNEE ARTHROSCOPY  Right  . LUMBAR SPINE SURGERY      . MITRAL VALVE REPAIR Right 01/07/2018    Procedure: MINIMALLY INVASIVE MITRAL VALVE REPAIR using LivaNova ring size 28 MM;  Surgeon: Rexene Alberts, MD;  Location: [place];  Service: Open Heart Surgery;  Laterality: Right;  . PATENT FORAMEN OVALE(PFO) CLOSURE N/A 01/07/2018    Procedure: PATENT FORAMEN OVALE (PFO) CLOSURE;  Surgeon: Rexene Alberts, MD;  Location: [place];  Service: Open Heart Surgery;  Laterality: N/A;  . RIGHT/LEFT HEART CATH AND CORONARY ANGIOGRAPHY N/A 11/11/2017    Procedure: RIGHT/LEFT HEART CATH AND CORONARY ANGIOGRAPHY;  Surgeon:  Sherren Mocha, MD;  Location: [place] CV LAB;  Service: Cardiovascular;  Laterality: N/A;  . TEE WITHOUT CARDIOVERSION N/A 09/18/2017    Procedure: TRANSESOPHAGEAL ECHOCARDIOGRAM (TEE);  Surgeon: Skeet Latch, MD;  Location: [place];  Service: Cardiovascular;  Laterality: N/A;  . TEE WITHOUT CARDIOVERSION N/A 01/07/2018    Procedure: TRANSESOPHAGEAL ECHOCARDIOGRAM (TEE);  Surgeon: Rexene Alberts, MD;  Location: [place];  Service: Open Heart Surgery;  Laterality: N/A;  . TOTAL KNEE ARTHROPLASTY        Left  . TUBAL LIGATION             Family History  Problem Relation Age of Onset  . Hypertension Mother    . Cancer Sister          leukemia  . Diabetes Brother    . Stroke Sister    . Diabetes Brother    . Heart attack Brother    . Healthy Daughter    . Healthy Son    . Healthy Daughter      Social History:  reports that she has never smoked. She has never used smokeless tobacco. She reports that she does not drink alcohol or use drugs. Allergies:       Allergies  Allergen Reactions  . Oxycodone-Acetaminophen Anxiety and Other (See Comments)      Hallucinations  . Aspirin Nausea And Vomiting          Medications Prior to Admission  Medication Sig Dispense Refill  . acetaminophen (TYLENOL) 500 MG tablet Take 1,000 mg by mouth every 6 (six) hours as needed for moderate pain or headache.      Marland Kitchen amiodarone (PACERONE) 200 MG tablet Take 1 tablet (200 mg total) by mouth daily. 30 tablet 1  . atorvastatin (LIPITOR) 40 MG tablet Take 1 tablet (40 mg total) by mouth at bedtime. 90 tablet 1  . ferrous sulfate 325 (65 FE) MG tablet Take 1 tablet (325 mg total) by mouth daily with breakfast. For one month then stop. 30 tablet 3  . hydrochlorothiazide (HYDRODIURIL) 25 MG tablet Take 1 tablet (25 mg total) by mouth daily. 30 tablet 1  . warfarin (COUMADIN) 2.5 MG tablet Take 1 tablet (2.5 mg total) by mouth daily at 6 PM. Or as directed 30 tablet 0      Drug Regimen  Review Drug regimen was reviewed and remains appropriate with no significant issues identified   Home: Home Living Family/patient expects to be discharged to:: Private residence Living Arrangements: Children, Other relatives Available Help at Discharge: Family, Available 24 hours/day Type of Home: Mobile home Home Access: Stairs to enter Entrance Stairs-Number of Steps: 3 Entrance Stairs-Rails: None Home Layout: One level Bathroom Shower/Tub: Chiropodist: Standard Home Equipment: None   Functional History: Prior Function Level of Independence: Independent Comments: independent up until cardiac surgery (~2 weeks ago)  Functional Status:  Mobility: Bed Mobility Overal bed mobility: Needs Assistance Bed Mobility: Rolling, Sidelying to Sit Rolling: Mod assist Sidelying to sit: Mod assist Supine to sit: +2 for safety/equipment, Mod assist General bed mobility comments: cues for sequencing; assistance to bring hips/bilat LE toward EOB on R side and pt used bed rail to assist; assist needed to bring LE off EOB and pt able to elevate trunk most of the way into sitting position Transfers Overall transfer level: Needs assistance Equipment used: 2 person hand held assist(gait belt ) Transfers: Sit to/from Stand, Stand Pivot Transfers Sit to Stand: Mod assist, +2 physical assistance Stand pivot transfers: Mod assist, +2 physical assistance General transfer comment: pt stood X2 from EOB and required total A for pericare in standing; assistance required to block R knee, for balance, and for weigth shifting; multimodal cues for upright posture; pt able to take pivotal steps to recliner with R knee blocked for safety   ADL: ADL Overall ADL's : Needs assistance/impaired Grooming: Wash/dry hands, Wash/dry face, Oral care, Minimal assistance, Supervision/safety, Set up, Sitting Upper Body Bathing: Cueing for sequencing, Maximal assistance Lower Body Bathing: Total  assistance Upper Body Dressing : Maximal assistance Lower Body Dressing: Total assistance Toilet Transfer: +2 for safety/equipment Toileting- Clothing Manipulation and Hygiene: Total assistance Functional mobility during ADLs: Moderate assistance, +2 for physical assistance   Cognition: Cognition Overall Cognitive Status: Impaired/Different from baseline Arousal/Alertness: Awake/alert Orientation Level: Oriented to person, Oriented to place, Disoriented to time, Disoriented to situation Attention: Sustained, Selective Sustained Attention: Appears intact Selective Attention: Appears intact Memory: (UTA) Awareness: Impaired Awareness Impairment: Intellectual impairment, Emergent impairment Problem Solving: Impaired Problem Solving Impairment: Verbal basic, Functional basic Cognition Arousal/Alertness: Awake/alert Behavior During Therapy: Flat affect Overall Cognitive Status: Impaired/Different from baseline Area of Impairment: Attention, Following commands, Safety/judgement, Problem solving Orientation Level: Disoriented to, Place, Time, Situation Current Attention Level: Focused Following Commands: Follows one step commands inconsistently, Follows one step commands with increased time Safety/Judgement: Decreased awareness of safety, Decreased awareness of deficits Problem Solving: Slow processing, Decreased initiation, Difficulty sequencing, Requires verbal cues, Requires tactile cues General Comments: R side inattention; pt will track across midline with cues; pt is a little impulsive and when given plan to stand begins to stand quickly without assistance   Physical Exam: Blood pressure 127/61, pulse 78, temperature 99.4 F (37.4 C), temperature source Oral, resp. rate 17, height 5' 3"  (1.6 m), weight 76.5 kg (168 lb 10.4 oz), last menstrual period 11/05/1992, SpO2 95 %. Physical Exam  Vitals reviewed. HENT:  Right facial weakness  Eyes: Right eye exhibits no discharge. Left  eye exhibits no discharge.  Pupils reactive to light  Neck: Normal range of motion. Neck supple. No thyromegaly present.  Cardiovascular:  Cardiac rate controlled  Respiratory: Effort normal and breath sounds normal.  GI: Soft. Bowel sounds are normal. She exhibits no distension.  Neurological: She is alert.  Patient sitting up in chair.  Makes good eye contact with examiner.  Noted expressive aphasia.  She does respond appropriately to head nods as well as follows simple commands  Skin: Skin is warm and dry.   Abrasion on right side of cheek Motor strength is 2- in the right deltoid bicep tricep grip difficult to assess secondary to her ability to follow commands, right lower extremity antigravity, difficult to formally assess secondary to aphasia and apraxia. Withdraws to pinch in the left lower limb but not in the right lower limb.  No withdrawal to pinch in the upper limbs  Inaccurate with yes knows.  No spontaneous speech except for occasional yes.  Does respond to gestural cues for manual muscle testing.    Lab Results Last 48 Hours        Results for orders placed or performed during the hospital encounter of 01/24/18 (from the past 48 hour(s))  Hemoglobin A1c     Status: None    Collection Time: 01/25/18  6:18 AM  Result Value Ref Range    Hgb A1c MFr Bld 5.4 4.8 - 5.6 %      Comment: (NOTE) Pre diabetes:          5.7%-6.4% Diabetes:              >6.4% Glycemic control for   <7.0% adults with diabetes      Mean Plasma Glucose 108.28 mg/dL      Comment: Performed at [facility] [address], [place], [place] [address]  Lipid panel     Status: None    Collection Time: 01/25/18  6:18 AM  Result Value Ref Range    Cholesterol 140 0 - 200 mg/dL    Triglycerides 89 <150 mg/dL    HDL 47 >40 mg/dL    Total CHOL/HDL Ratio 3.0 RATIO    VLDL 18 0 - 40 mg/dL    LDL Cholesterol 75 0 - 99 mg/dL      Comment:        Total Cholesterol/HDL:CHD Risk Coronary Heart Disease  Risk Table                     Men   Women  1/2 Average Risk   3.4   3.3  Average Risk       5.0   4.4  2 X Average Risk   9.6   7.1  3 X Average Risk  23.4   11.0        Use the calculated Patient Ratio above and the CHD Risk Table to determine the patient's CHD Risk.        ATP III CLASSIFICATION (LDL):  <100     mg/dL   Optimal  100-129  mg/dL   Near or Above                    Optimal  130-159  mg/dL   Borderline  160-189  mg/dL   High  >190     mg/dL   Very High Performed at [facility] [address], [place], [place] [address]    Basic metabolic panel     Status: Abnormal    Collection Time: 01/25/18  6:18 AM  Result Value Ref Range    Sodium 140 135 - 145 mmol/L    Potassium 4.2 3.5 - 5.1 mmol/L    Chloride 111 101 - 111 mmol/L    CO2 18 (L) 22 - 32 mmol/L    Glucose, Bld 73 65 - 99 mg/dL    BUN 17 6 - 20 mg/dL    Creatinine, Ser 1.47 (H) 0.44 - 1.00 mg/dL    Calcium 8.6 (L) 8.9 - 10.3 mg/dL    GFR calc non Af Amer 33 (L) >60 mL/min    GFR calc Af Amer 39 (L) >60 mL/min      Comment: (NOTE) The eGFR has been calculated using the CKD EPI equation. This calculation has not been validated in all clinical situations. eGFR's persistently <60 mL/min signify possible Chronic Kidney Disease.  Anion gap 11 5 - 15      Comment: Performed at [facility] [address], [place], [place] [address]  CBC     Status: Abnormal    Collection Time: 01/25/18  6:18 AM  Result Value Ref Range    WBC 8.0 4.0 - 10.5 K/uL    RBC 3.44 (L) 3.87 - 5.11 MIL/uL    Hemoglobin 10.8 (L) 12.0 - 15.0 g/dL    HCT 34.1 (L) 36.0 - 46.0 %    MCV 99.1 78.0 - 100.0 fL    MCH 31.4 26.0 - 34.0 pg    MCHC 31.7 30.0 - 36.0 g/dL    RDW 14.2 11.5 - 15.5 %    Platelets 323 150 - 400 K/uL      Comment: Performed at [facility], [place] [address] [place], [place] [address]       Imaging Results (Last 48 hours)  Ct Head Wo Contrast   Result Date: 01/24/2018 CLINICAL DATA:   Follow up stroke. History of hypertension, hyperlipidemia, neuroendocrine tumor. EXAM: CT HEAD WITHOUT CONTRAST TECHNIQUE: Contiguous axial images were obtained from the base of the skull through the vertex without intravenous contrast. COMPARISON:  CT/CTA HEAD January 24, 2018. FINDINGS: BRAIN: No intraparenchymal hemorrhage, mass effect nor midline shift. Hypodense LEFT basal ganglia, LEFT insular ribbon sign, loss of LEFT frontotemporal gray-white matter junction. Old LEFT cerebellar infarcts. No parenchymal brain volume loss for age. No hydrocephalus. No abnormal extra-axial fluid collections. Basal cisterns are patent. VASCULAR: Moderate calcific atherosclerosis of the carotid siphons. Dense LEFT insular MCA. SKULL: No skull fracture. Occluded RIGHT facial soft tissue swelling without subcutaneous gas or radiopaque foreign bodies. SINUSES/ORBITS: The mastoid air-cells and included paranasal sinuses are well-aerated.The included ocular globes and orbital contents are non-suspicious. Status post bilateral ocular lens implants. OTHER: None. IMPRESSION: 1. Evolving acute nonhemorrhagic LEFT MCA territory infarct. Dense LEFT MCA consistent with thromboembolism. 2. Old LEFT cerebellar infarct. Electronically Signed   By: Elon Alas M.D.   On: 01/24/2018 23:32    Mr Brain Wo Contrast   Result Date: 01/25/2018 CLINICAL DATA:  Follow up stroke. History of hypertension, hyperlipidemia and neuroendocrine tumor. EXAM: MRI HEAD WITHOUT CONTRAST TECHNIQUE: Multiplanar, multiecho pulse sequences of the brain and surrounding structures were obtained without intravenous contrast. COMPARISON:  CT HEAD January 24, 2018 FINDINGS: INTRACRANIAL CONTENTS: Confluent reduced diffusion LEFT frontoparietal and temporal lobes including insula, reduced diffusion LEFT basal ganglia. Subcentimeter discontinuous reduced diffusion LEFT parietal lobes. Areas of reduced diffusion demonstrate low ADC values, RIGHT FLAIR T2 hyperintense  signal. Speckled susceptibility artifact LEFT frontal parietal lobes with additional chronic microhemorrhages bilateral occipital lobes and RIGHT parietal lobe. 2 mm LEFT-to-RIGHT midline shift. Patchy supratentorial white matter FLAIR T2 hyperintensities exclusive of the aforementioned abnormality compatible with mild chronic small vessel ischemic disease. Old LEFT cerebellar infarct. No parenchymal brain volume loss for age. No hydrocephalus. No abnormal extra-axial fluid collections. VASCULAR: Normal major intracranial vascular flow voids present at skull base. SKULL AND UPPER CERVICAL SPINE: No abnormal sellar expansion. No suspicious calvarial bone marrow signal. Craniocervical junction maintained. SINUSES/ORBITS: The mastoid air-cells and included paranasal sinuses are well-aerated.The included ocular globes and orbital contents are non-suspicious. Status post bilateral ocular lens implant. OTHER: Patient is edentulous. IMPRESSION: 1. 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 2 mm LEFT-to-RIGHT midline shift. 2. Susceptibility artifact seen with old PRES, chronic hypertension, less likely amyloid angiopathy. 3. Old LEFT cerebellar infarct. Electronically Signed   By: Sandie Ano  Bloomer M.D.   On: 01/25/2018 06:10             Medical Problem List and Plan: 1.  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  DVT Prophylaxis/Anticoagulation: SCDs. 3. Pain Management: Hydrocodone as needed 4. Mood: Provide emotional support 5. Neuropsych: This patient is capable of making decisions on her own behalf. 6. Skin/Wound Care: Routine skin checks 7. Fluids/Electrolytes/Nutrition: Routine in and outs with follow-up chemistries 8.  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  Follow-up cardiology services.  Chronic amiodarone on hold as patient's cardiac rate controlled.  CHRONIC COUMADIN currently on hold due to high risk of hemorrhage after recent CVA  last Neuro note rec for ELiquis 1 wk post CVA if CT head is neg for hemorrhagic trasnformation, this will be ordered on 6/23.  Since this is valvular A flutter Cardiology not rec Eliquis but resume Warfarin to keep INR >2.0 This was discussed with the patient's daughter 89.  Chronic diastolic congestive heart failure.  Monitor for any signs of fluid overload 10.  Hypertension.  Permissive hypertension.  Patient on amiodarone 200 mg daily, HCTZ 25 mg daily prior to admission.  Resume as needed 11.  Hyperlipidemia.  Lipitor  Post Admission Physician Evaluation: 1. Functional deficits secondary  to right MCA infarct with global aphasia and right hemiparesis and apraxia. 2. Patient admitted to receive collaborative, interdisciplinary care between the physiatrist, rehab nursing staff, and therapy team. 3. Patient's level of medical complexity and substantial therapy needs in context of that medical necessity cannot be provided at a lesser intensity of care. 4. Patient has experienced substantial functional loss from his/her baseline.Judging by the patient's diagnosis, physical exam, and functional history, the patient has potential for functional progress which will result in measurable gains while on inpatient rehab.  These gains will be of substantial and practical use upon discharge in facilitating mobility and self-care at the household level. 5. Physiatrist will provide 24 hour management of medical needs as well as oversight of the therapy plan/treatment and provide guidance as appropriate regarding the interaction of the two. 6. 24 hour rehab nursing will assist in the management of  bladder management, bowel management, safety, skin/wound care, disease management, medication administration, pain management and patient education  and help integrate therapy concepts, techniques,education, etc. 7. PT will assess and treat for:pre gait, gait training, endurance , safety, equipment, neuromuscular re  education  .  Goals are: minimal assist. 8. OT will assess and treat for ADLs, Cognitive perceptual skills, Neuromuscular re education, safety, endurance, equipment  .  Goals are: minimal assist.  9. SLP will assess and treat for cognition, communication, swallowing  .  Goals are: minimal assist for answering yes no questions for basic biographical information.. 10. Case Management and Social Worker will assess and treat for psychological issues and discharge planning. 11. Team conference will be held weekly to assess progress toward goals and to determine barriers to discharge. 12.  Patient will receive at least 3 hours of therapy per day at least 5 days per week. 13. ELOS and Prognosis: 21-24d fair      "I have personally performed a face to face diagnostic evaluation of this patient.  Additionally, I have reviewed and concur with the physician assistant's documentation above."  Charlett Blake M.D. [facility] FAAPM&R (Sports Med, Neuromuscular Med) Diplomate Am Board of Electrodiagnostic Med  Elizabeth Sauer 01/26/2018

## 2018-01-27 NOTE — Progress Notes (Signed)
Daily Progress Note   Patient Name: Brooke Chavez       Date: 01/27/2018 DOB: March 25, 1941  Age: 77 y.o. MRN#: [id number] Attending Physician: Brooke Chavez, * Primary Care Physician: Brooke Pretty, FNP Admit Date: 01/24/2018  Reason for Consultation/Follow-up: Establishing goals of care  Subjective: Pt sitting up in chair. Verbalizing more today. Patient frustrated with limits to communication but per daughter working hard with family and therapists to overcome limits. Brooke Chavez notes that patient has worked hard in the past to overcome limitations (two knee replacements after age 31, and a broken femur) and she knows patient will work hard in rehab. Plan to d/c to CIR today. Daughter Brooke Chavez at bedside. No needs or complaints.   Review of Systems  Unable to perform ROS: Acuity of condition    Length of Stay: 3  Current Medications: Scheduled Meds:  .  stroke: mapping our early stages of recovery book   Does not apply Once  . aspirin EC  81 mg Oral Daily  . atorvastatin  40 mg Oral QHS    Continuous Infusions: . sodium chloride 75 mL/hr at 01/27/18 0340    PRN Meds: acetaminophen **OR** acetaminophen, bisacodyl, hydrALAZINE, HYDROcodone-acetaminophen, LORazepam, ondansetron **OR** ondansetron (ZOFRAN) IV, senna-docusate  Physical Exam          Vital Signs: BP (!) 179/68 (BP Location: Left Arm)   Pulse 74   Temp 98.6 F (37 C) (Oral)   Resp 20   Ht 5\' 3"  (1.6 m)   Wt 76.5 kg (168 lb 10.4 oz)   LMP 11/05/1992   SpO2 99%   BMI 29.88 kg/m  SpO2: SpO2: 99 % O2 Device: O2 Device: Room Air O2 Flow Rate: O2 Flow Rate (L/min): 2 L/min  Intake/output summary:   Intake/Output Summary (Last 24 hours) at 01/27/2018 1401 Last data filed at 01/27/2018 0000 Gross per  24 hour  Intake 1756.25 ml  Output -  Net 1756.25 ml   LBM: Last BM Date: 01/23/18 Baseline Weight: Weight: 71.7 kg (158 lb) Most recent weight: Weight: 76.5 kg (168 lb 10.4 oz)       Palliative Assessment/Data: PPS: 40%      Patient Active Problem List   Diagnosis Date Noted  . Advance care planning   . Goals of care, counseling/discussion   . Palliative care  by specialist   . Acute ischemic left MCA stroke ([place]) 01/24/2018  . Stroke (cerebrum) ([place]) 01/24/2018  . Encounter for therapeutic drug monitoring 01/18/2018  . 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 Chronic diastolic congestive heart failure ([place])   . 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 Liver masses 11/13/2017  . BMI 30.0-30.9,adult 07/24/2015  . Osteoporosis 09/19/2014  . GERD (gastroesophageal reflux disease) 07/29/2011  . Atrial flutter ([place]) 05/29/2010  . Hyperlipidemia with target LDL less than 100 01/04/2009  . Essential hypertension 01/04/2009    Palliative Care Assessment & Plan   Patient Profile: 77 y.o. female  with past medical history of small pancreatic mass (?neuroendocrine on PET- no tissue diagnosis, further workup delayed d/t cardiac surgery), a. 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 MRI on 6/17 showed 21mm L to R midline shift- plan to monitor and transfer to ICU if clinical worsening. Patient eating soft mechanical diet, drinking thin liquids, participating in PT/OT/SLP, has been recommended for CIR- awaiting insurance approval. Palliative medicine consulted for [place].    Assessment/Recommendations/Plan   Plan for d/c to CIR today- PMT will sign off  Please reconsult PMT if patient fails to progress or has further decline   Goals of Care and Additional Recommendations:  Limitations on Scope of Treatment: Full Scope Treatment  Code Status:  DNR - WITH FULL SCOPE  TREATMENT  Prognosis:   Unable to determine  Discharge Planning:  CIR  Care plan was discussed with patient's daughter- Brooke Chavez  Thank you for allowing the Palliative Medicine Team to assist in the care of this patient.   Time In: 1340 Time Out: 1405 Total Time 25 mins Prolonged Time Billed no      Greater than 50%  of this time was spent counseling and coordinating care related to the above assessment and plan.  Brooke Chavez, AGNP-C Palliative Medicine   Please contact Palliative Medicine Team phone at [phone] for questions and concerns.

## 2018-01-27 NOTE — Progress Notes (Signed)
Brooke Gong, RN  Rehab Admission Coordinator  Physical Medicine and Rehabilitation  PMR Pre-admission  Addendum  Date of Service:  01/26/2018 5:17 PM       Related encounter: ED to Hosp-Admission (Current) from 01/24/2018 in [facility] Progressive [facility]           Show:Clear all [x] Manual[x] Template[x] Copied  Added by: [x] Brooke Gong, RN   [] Hover for details   PMR Admission Coordinator Pre-Admission Assessment  Patient: Brooke Chavez is an 77 y.o., female MRN: [id number] DOB: 12-13-1940 Height: 5\' 3"  (160 cm) Weight: 76.5 kg (168 lb 10.4 oz)                                                                                                                                                  Insurance Information HMO:     PPO:      PCP:      IPA:      80/20:      OTHER: no HMO PRIMARY: Medicare a and b      Policy#: [id number]      Subscriber: pt benefits:  Phone #: passport one online     Name: 01/26/18 Eff. Date: 02/08/2005     Deduct: $1364      Out of Pocket Max: none      Life Max: none CIR: 100%      SNF: 20 full days Outpatient: 80%     Co-Pay: 20% Home Health: 100%      Co-Pay: none DME: 80%     Co-Pay: 20% Providers: pt choice  SECONDARY: Brooke Chavez      Policy#: [id number]      Subscriber: pt  Medicaid Application Date:       Case Manager:  Disability Application Date:       Case Worker:   Emergency [place]    Name Relation Home Work Mobile   Brooke Chavez Daughter [phone]  [phone]   Barrie Dunker [phone]     Brooke Chavez Daughter [phone]       Current Medical History   Patient Admitting Diagnosis: left MCA infarct  History of Present Illness:  Brooke Chavez a 77 year old right-handed female with complex medical history of mitral valve disease status post mitral valve repair 01/07/2018 per Brooke Chavez was discharged on Coumadin  01/14/2018 as well as history of hypertension and hyperlipidemia. Presented to [facility] 01/24/2018 after being found down by family with right sided weakness and aphasia. Per chart review patient lives with daughter. Had been independent prior to admission without need for assistive device. Cranial CT scan showed large territory acute left MCA infarction without hemorrhage. Hyperdense left internal carotid artery left MCA compatible with acute thrombosis. Patient was discharged to [facility] for ongoing care. INR on admission of 1.7. CT angiogram of head and  neck showed left proximal M1 occlusion with thrombus measuring approximately 8 mm in length. Large perfusion penumbra78 cc. Interventional radiology consulted no plan for IR intervention due to high risk of bleeding. Echocardiogram with ejection fraction of 55%. Systolic function was normal. No PFO seen. MRI of the brain 01/25/2017 showed large left MCA territory infarction with petechial hemorrhage and a 2 mm left to right midline shift. Old left cerebellar infarction. Cardiology service follow-up no plan for TEE.   Total: 12 NIHSS  Past Medical History      Past Medical History:  Diagnosis Date  . Atrial flutter ([place])    Typical by EKG diagnosis 9/11 s/p CIT ablation 11/11  . Bradycardia   . Chronic diastolic congestive heart failure ([place])   . DJD (degenerative joint disease)   . Dysrhythmia   . Femur fracture, left ([place]) 2013  . Heart murmur   . Hyperlipidemia    x5 years  . Hypertension    Since 1997  . Liver masses 11/13/2017   Multiple small nodules seen on CT and MRI  . Mitral regurgitation    severe  . 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 TR (tricuspid regurgitation)    Mild  with RA enlargment    Family History  family history includes Cancer in her sister; Diabetes in her brother and brother; Healthy in her daughter, daughter, and son; Heart attack in her brother; Hypertension in her mother; Stroke in her sister.  Prior Rehab/Hospitalizations:  Has the patient had major surgery during 100 days prior to admission? Yes  Current Medications   Current Facility-Administered Medications:  .   stroke: mapping our early stages of recovery book, , Does not apply, Once, Brooke Chavez, Brooke Cruz Chavez, Brooke Chavez .  0.9 %  sodium chloride infusion, , Intravenous, Continuous, Brooke Jumbo, Brooke Chavez, Last Rate: 75 mL/hr at 01/27/18 0340 .  acetaminophen (TYLENOL) tablet 650 mg, 650 mg, Oral, Q6H PRN **OR** acetaminophen (TYLENOL) suppository 650 mg, 650 mg, Rectal, Q6H PRN, Brooke Chavez, Brooke Chavez, Brooke Chavez .  aspirin EC tablet 81 mg, 81 mg, Oral, Daily, Biby, Brooke Chavez, Brooke Chavez, 81 mg at 01/27/18 0903 .  atorvastatin (LIPITOR) tablet 40 mg, 40 mg, Oral, QHS, Biby, Brooke Chavez, Brooke Chavez, 40 mg at 01/26/18 2204 .  bisacodyl (DULCOLAX) suppository 10 mg, 10 mg, Rectal, Daily PRN, Brooke Jumbo, Brooke Chavez .  hydrALAZINE (APRESOLINE) injection 5 mg, 5 mg, Intravenous, Q8H PRN, Brooke Chavez, Brooke Chavez, Brooke Chavez .  HYDROcodone-acetaminophen (NORCO/VICODIN) 5-325 MG per tablet 1-2 tablet, 1-2 tablet, Oral, Q4H PRN, Brooke Chavez, Brooke Chavez, Brooke Chavez .  LORazepam (ATIVAN) injection 0.25 mg, 0.25 mg, Intravenous, Q6H PRN, [place], Brooke Chavez, Brooke Chavez .  ondansetron (ZOFRAN) tablet 4 mg, 4 mg, Oral, Q6H PRN **OR** ondansetron (ZOFRAN) injection 4 mg, 4 mg, Intravenous, Q6H PRN, [place], Brooke Chavez, Brooke Chavez .  senna-docusate (Senokot-S) tablet 1 tablet, 1 tablet, Oral, QHS PRN, Brooke Jumbo, Brooke Chavez  Patients Current Diet:       Diet Order           DIET DYS 3 Room service appropriate? Yes; Fluid consistency: Thin  Diet effective now          Precautions / Restrictions Precautions Precautions: Fall Restrictions Weight Bearing Restrictions: No    Has the patient had 2 or more falls or a fall with injury in the past  year?No  Prior Activity Level Community (5-7x/wk): Mod I with RW since last admit for surgery  Home Assistive Devices / [place] Devices/Equipment: Blood pressure cuff, Walker (specify type), Eyeglasses Home Equipment: None  Prior Device Use: Indicate devices/aids used by the patient prior to current illness, exacerbation or injury? Walker  Prior Functional Level Prior Function Level of Independence: Independent Comments: independent up until cardiac surgery (~2 weeks ago)  Self Care: Did the patient need help bathing, dressing, using the toilet or eating?  Independent  Indoor Mobility: Did the patient need assistance with walking from room to room (with or without device)? Independent  Stairs: Did the patient need assistance with internal or external stairs (with or without device)? Needed some help  Functional Cognition: Did the patient need help planning regular tasks such as shopping or remembering to take medications? Needed some help  Current Functional Level Cognition  Arousal/Alertness: Awake/alert Overall Cognitive Status: Impaired/Different from baseline Current Attention Level: Focused Orientation Level: Oriented to person, Oriented to situation Following Commands: Follows one step commands inconsistently, Follows one step commands with increased time Safety/Judgement: Decreased awareness of safety, Decreased awareness of deficits General Comments: R side inattention; pt will track across midline with cues; pt is a little impulsive and when given plan to stand begins to stand quickly without assistance Attention: Sustained, Selective Sustained Attention: Appears intact Selective Attention: Appears intact Memory: (UTA) Awareness: Impaired Awareness Impairment: Intellectual impairment, Emergent impairment Problem Solving: Impaired Problem Solving Impairment: Verbal  basic, Functional basic    Extremity Assessment (includes Sensation/Coordination)  Upper Extremity Assessment: Defer to OT evaluation  Lower Extremity Assessment: RLE deficits/detail RLE Deficits / Details: R LE grossly 1-2/5    ADLs  Overall ADL's : Needs assistance/impaired Grooming: Wash/dry hands, Wash/dry face, Oral care, Minimal assistance, Supervision/safety, Set up, Sitting Upper Body Bathing: Cueing for sequencing, Maximal assistance Lower Body Bathing: Total assistance Upper Body Dressing : Maximal assistance Lower Body Dressing: Total assistance Toilet Transfer: +2 for safety/equipment Toileting- Clothing Manipulation and Hygiene: Total assistance Functional mobility during ADLs: Moderate assistance, +2 for physical assistance    Mobility  Overal bed mobility: Needs Assistance Bed Mobility: Rolling, Sidelying to Sit Rolling: Mod assist Sidelying to sit: Mod assist Supine to sit: +2 for safety/equipment, Mod assist General bed mobility comments: cues for sequencing; assistance to bring hips/bilat LE toward EOB on R side and pt used bed rail to assist; assist needed to bring LE off EOB and pt able to elevate trunk most of the way into sitting position    Transfers  Overall transfer level: Needs assistance Equipment used: 2 person hand held assist(gait belt ) Transfers: Sit to/from Stand, Stand Pivot Transfers Sit to Stand: Mod assist, +2 physical assistance Stand pivot transfers: Mod assist, +2 physical assistance General transfer comment: pt stood X2 from EOB and required total A for pericare in standing; assistance required to block R knee, for balance, and for weigth shifting; multimodal cues for upright posture; pt able to take pivotal steps to recliner with R knee blocked for safety    Ambulation / Gait / Stairs / Wheelchair Mobility       Posture / Balance Balance Overall balance assessment: Needs assistance Sitting-balance support: Single extremity  supported, Feet supported Sitting balance-Leahy Scale: Poor Standing balance support: Bilateral upper extremity supported, During functional activity Standing balance-Leahy Scale: Poor    Special needs/care consideration BiPAP/CPAP  N/a CPM  N/a Continuous Drip IV n/a Dialysis  N/a Life Vest  N/a Oxygen  N/a  Special Bed  N/a Trach Size  N/a Wound Vac n/a Skin recent cardiac surgical incision; right facial blister Bowel mgmt: continent 6/18 Bladder mgmt:incontinent; external catheter Diabetic mgmt n/a Palliative consult for goals of care 6/18, patient made DNR Stitches removed 6/19 from old anterior chest tube site and dry gauze dressing applied   Previous Home Environment Living Arrangements: (wife and caregivers)  Lives With: Family Available Help at Discharge: Family, Other (Comment) Type of Home: Mobile home Home Layout: One level Home Access: Stairs to enter Entrance Stairs-Rails: None Entrance Stairs-Number of Steps: 3 Bathroom Shower/Tub: Optometrist: Yes How Accessible: Accessible via walker [place]: No  Discharge Living Setting Plans for Discharge Living Setting: Lives with (comment), Mobile Home(daughter, Nicole) Type of Home at Discharge: Mobile home Discharge Home Layout: One level Discharge Home Access: Stairs to enter Entrance Stairs-Rails: None Entrance Stairs-Number of Steps: 3 Discharge Bathroom Shower/Tub: Tub/shower unit, Curtain Discharge Bathroom Toilet: Standard Discharge Bathroom Accessibility: Yes How Accessible: Accessible via walker Does the patient have any problems obtaining your medications?: No  Social/Family/Support Systems Patient Roles: Parent(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 Information: daughters Anticipated Caregiver: family Anticipated Ambulance person Information: see above Ability/Limitations of Caregiver: [name], daughter works  during the day but 36 year old grand child there or other family during those times Caregiver Availability: 24/7 Discharge Plan Discussed with Primary Caregiver: Yes Is Caregiver In Agreement with Plan?: Yes Does Caregiver/Family have Issues with Lodging/Transportation while Pt is in Rehab?: No(Family have been staying with her in hospital 24/7)  Goals/Additional Needs Patient/Family Goal for Rehab: supervision to min asisst with PT, OT, and SLP Expected length of stay: ELOS 20 to 25 days Pt/Family Agrees to Admission and willing to participate: Yes Program Orientation Provided & Reviewed with Pt/Caregiver Including Roles  & Responsibilities: Yes  Decrease burden of Care through IP rehab admission: n/a  Possible need for SNF placement upon discharge: not anticipated  Patient Condition: This patient's condition remains as documented in the consult dated 01/26/2018, in which the Rehabilitation Physician determined and documented that the patient's condition is appropriate for intensive rehabilitative care in an inpatient rehabilitation facility. Will admit to inpatient rehab today.  Preadmission Screen Completed By:  Cleatrice Burke, 01/27/2018 10:46 AM ______________________________________________________________________   Discussed status with Dr. Letta Pate on 01/27/18 at  1045 and received telephone approval for admission today.  Admission Coordinator:  Cleatrice Burke, time 8119 Date 01/27/18         Cosigned by: Charlett Blake, MD at 01/27/2018 11:55 AM  Revision History

## 2018-01-27 NOTE — Consult Note (Addendum)
   [facility] CM Inpatient Consult   01/27/2018  Brooke Chavez 1940-09-02 [id number]    Patient screened for potential [facility] Care Management program due to recent hospitalization.  Chart reviewed. Noted disposition plans are for CIR.   No identifiable [facility] Care Management needs at this current time.     Marthenia Rolling, MSN-Ed, RN,BSN [facility] Liaison [phone]

## 2018-01-28 ENCOUNTER — Telehealth: Payer: Self-pay

## 2018-01-28 ENCOUNTER — Encounter: Payer: Self-pay | Admitting: *Deleted

## 2018-01-28 ENCOUNTER — Inpatient Hospital Stay (HOSPITAL_COMMUNITY): Payer: Medicare Other | Admitting: Speech Pathology

## 2018-01-28 ENCOUNTER — Inpatient Hospital Stay (HOSPITAL_COMMUNITY): Payer: Medicare Other | Admitting: Physical Therapy

## 2018-01-28 ENCOUNTER — Inpatient Hospital Stay (HOSPITAL_COMMUNITY): Payer: Medicare Other | Admitting: Occupational Therapy

## 2018-01-28 LAB — CBC WITH DIFFERENTIAL/PLATELET
Abs Immature Granulocytes: 0 10*3/uL (ref 0.0–0.1)
Basophils Absolute: 0 10*3/uL (ref 0.0–0.1)
Basophils Relative: 1 %
Eosinophils Absolute: 0.3 10*3/uL (ref 0.0–0.7)
Eosinophils Relative: 4 %
HCT: 29.2 % — ABNORMAL LOW (ref 36.0–46.0)
Hemoglobin: 9.4 g/dL — ABNORMAL LOW (ref 12.0–15.0)
Immature Granulocytes: 0 %
Lymphocytes Relative: 17 %
Lymphs Abs: 1.2 10*3/uL (ref 0.7–4.0)
MCH: 31 pg (ref 26.0–34.0)
MCHC: 32.2 g/dL (ref 30.0–36.0)
MCV: 96.4 fL (ref 78.0–100.0)
Monocytes Absolute: 0.7 10*3/uL (ref 0.1–1.0)
Monocytes Relative: 10 %
Neutro Abs: 5.1 10*3/uL (ref 1.7–7.7)
Neutrophils Relative %: 68 %
Platelets: 271 10*3/uL (ref 150–400)
RBC: 3.03 MIL/uL — ABNORMAL LOW (ref 3.87–5.11)
RDW: 13.4 % (ref 11.5–15.5)
WBC: 7.4 10*3/uL (ref 4.0–10.5)

## 2018-01-28 LAB — COMPREHENSIVE METABOLIC PANEL
ALT: 17 U/L (ref 14–54)
AST: 23 U/L (ref 15–41)
Albumin: 2.3 g/dL — ABNORMAL LOW (ref 3.5–5.0)
Alkaline Phosphatase: 48 U/L (ref 38–126)
Anion gap: 6 (ref 5–15)
BUN: 9 mg/dL (ref 6–20)
CO2: 19 mmol/L — ABNORMAL LOW (ref 22–32)
Calcium: 8.6 mg/dL — ABNORMAL LOW (ref 8.9–10.3)
Chloride: 114 mmol/L — ABNORMAL HIGH (ref 101–111)
Creatinine, Ser: 1.22 mg/dL — ABNORMAL HIGH (ref 0.44–1.00)
GFR calc Af Amer: 48 mL/min — ABNORMAL LOW (ref >60)
GFR calc non Af Amer: 42 mL/min — ABNORMAL LOW (ref >60)
Glucose, Bld: 92 mg/dL (ref 65–99)
Potassium: 3.9 mmol/L (ref 3.5–5.1)
Sodium: 139 mmol/L (ref 135–145)
Total Bilirubin: 0.9 mg/dL (ref 0.3–1.2)
Total Protein: 5.3 g/dL — ABNORMAL LOW (ref 6.5–8.1)

## 2018-01-28 NOTE — Evaluation (Signed)
Physical Therapy Assessment and Plan  Patient Details  Name: Brooke Chavez MRN: [id number] Date of Birth: 1940/09/21  PT Diagnosis: Abnormality of gait, Cognitive deficits, Coordination disorder, Difficulty walking, Hemiplegia, Impaired cognition and Muscle weakness Rehab Potential: Good ELOS: 17-21 days    Today's Date: 01/28/2018 PT Individual Time: 0910-1010 PT Individual Time Calculation (min): 60 min    Problem List:  Patient Active Problem List   Diagnosis Date Noted  . 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 Hemiparesis of right dominant side as late effect of cerebral infarction ([place])   . 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 Gait disturbance, post-stroke   . Dysphagia, post-stroke   . Advance care planning   . Goals of care, counseling/discussion   . Palliative care by specialist   . Acute ischemic left MCA stroke ([place]) 01/24/2018  . Stroke (cerebrum) ([place]) 01/24/2018  . Encounter for therapeutic drug monitoring 01/18/2018  . 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 Chronic diastolic congestive heart failure ([place])   . 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 Liver masses 11/13/2017  . BMI 30.0-30.9,adult 07/24/2015  . Osteoporosis 09/19/2014  . GERD (gastroesophageal reflux disease) 07/29/2011  . Atrial flutter ([place]) 05/29/2010  . Hyperlipidemia with target LDL less than 100 01/04/2009  . Essential hypertension 01/04/2009    Past Medical History:  Past Medical History:  Diagnosis Date  . Atrial flutter ([place])    Typical by EKG diagnosis 9/11 s/p CIT ablation 11/11  . Bradycardia   . Chronic diastolic congestive heart failure ([place])   . DJD (degenerative joint disease)   . Dysrhythmia   . Femur fracture, left ([place]) 2013  . Heart murmur   . Hyperlipidemia    x5 years  . Hypertension    Since 1997  . Liver masses 11/13/2017   Multiple small nodules seen on CT and MRI  . Mitral regurgitation    severe  .  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 TR (tricuspid regurgitation)    Mild with RA enlargment   Past Surgical History:  Past Surgical History:  Procedure Laterality Date  . A FLUTTER ABLATION N/A 06/27/2011   Procedure: ABLATION A FLUTTER;  Surgeon: Thompson Grayer, MD;  Location: [facility] CATH LAB;  Service: Cardiovascular;  Laterality: N/A;  . ATRIAL ABLATION SURGERY  06/2010  . HIP SURGERY     Left (fracture) 3/13  . JOINT REPLACEMENT     both knees   . KNEE ARTHROSCOPY     Right  . LUMBAR SPINE SURGERY    . MITRAL VALVE REPAIR Right 01/07/2018   Procedure: MINIMALLY INVASIVE MITRAL VALVE REPAIR using LivaNova ring size 28 MM;  Surgeon: Rexene Alberts, MD;  Location: [place];  Service: Open Heart Surgery;  Laterality: Right;  . PATENT FORAMEN OVALE(PFO) CLOSURE N/A 01/07/2018   Procedure: PATENT FORAMEN OVALE (PFO) CLOSURE;  Surgeon: Rexene Alberts, MD;  Location: [place];  Service: Open Heart Surgery;  Laterality: N/A;  . RIGHT/LEFT HEART CATH AND CORONARY ANGIOGRAPHY N/A 11/11/2017   Procedure: RIGHT/LEFT HEART CATH AND CORONARY ANGIOGRAPHY;  Surgeon: Sherren Mocha, MD;  Location: [facility] CV LAB;  Service: Cardiovascular;  Laterality: N/A;  . TEE WITHOUT CARDIOVERSION N/A 09/18/2017   Procedure: TRANSESOPHAGEAL ECHOCARDIOGRAM (TEE);  Surgeon: Skeet Latch, MD;  Location: [facility] ENDOSCOPY;  Service: Cardiovascular;  Laterality: N/A;  . TEE WITHOUT CARDIOVERSION N/A 01/07/2018   Procedure: TRANSESOPHAGEAL ECHOCARDIOGRAM (TEE);  Surgeon: Rexene Alberts, MD;  Location: [place];  Service: Open Heart Surgery;  Laterality: N/A;  . TOTAL KNEE ARTHROPLASTY     Left  . TUBAL LIGATION      Assessment & Plan Clinical Impression: Patient is a 77 year old right-handed female with complex medical history  of mitral valve disease/atrial flutterstatus post mitral valve repair 01/07/2018 per Dr. Marcelina Morel was discharged on Coumadin 01/14/2018 as well as history of hypertension, chronic diastolic congestive heart failureand hyperlipidemia. Presented to [facility] 01/24/2018 after being found down by family with right sided weakness and aphasia. Per chart review patient lives with daughter. Had been independent prior to admission without need for assistive device. Cranial CT scan showed large territory acute left MCA infarction without hemorrhage. Hyperdense left internal carotid artery left MCA compatible with acute thrombosis. Patient was discharged to [facility] for ongoing care. INR on admission of 1.7. CT angiogram of head and neck showed left proximal M1 occlusion with thrombus measuring approximately 8 mm in length. Large perfusion penumbra78 cc. Interventional radiology consulted no plan for IR intervention due to high risk of bleeding. Echocardiogram with ejection fraction of 55%. Systolic function was normal. No PFO seen. MRI of the brain 01/25/2017 showed large left MCA territory infarction with petechial hemorrhage and a 2 mm left to right midline shift. Old left cerebellar infarction. Cardiology service follow-up no plan for TEE.Chronic Coumadin currently remains on hold due to high risk of hemorrhage as per neurology servicesand plan will be to begin Eliquis in place of Coumadin 1 week post stroke if repeat CT does not show signs of hemmorhagic trasnformation. Final note from Cardiology recommends Warfarin rather than Eliquis given that this is valvular ht disease associated with aflutter. Physical and occupational therapy evaluations completed with recommendations of physical medicine rehab consult. Patient transferred to CIR on 01/27/2018 .   Patient currently requires max with mobility secondary to muscle weakness, decreased cardiorespiratoy endurance, impaired timing  and sequencing, unbalanced muscle activation, motor apraxia, decreased coordination and decreased motor planning, decreased visual perceptual skills and decreased visual motor skills, decreased attention to right, decreased initiation, decreased attention, decreased awareness, decreased problem solving, decreased safety awareness, decreased memory and delayed processing and decreased standing balance, decreased postural control, hemiplegia and decreased balance strategies.  Prior to hospitalization, patient was independent  with mobility and lived with Family(daughter and 48 year old grandson) in a Mobile home home.  Home access is 3Stairs to enter.  Patient will benefit from skilled PT intervention to maximize safe functional mobility, minimize fall risk and decrease caregiver burden for planned discharge home with 24 hour supervision.  Anticipate patient will benefit from follow up [place] at discharge.  PT - End of Session Activity Tolerance: Tolerates < 10 min activity, no significant change in vital signs Endurance Deficit: Yes Endurance Deficit Description: decreased PT Assessment Rehab Potential (ACUTE/IP ONLY): Good PT Barriers to Discharge: Home environment access/layout PT Barriers to Discharge Comments: 3 steps to enter home w/o rails  PT Patient demonstrates impairments in the following area(s): Balance;Endurance;Motor;Perception;Safety PT Transfers Functional Problem(s): Bed Mobility;Bed to Chair;Car;Furniture;Floor PT Locomotion Functional Problem(s): Stairs;Wheelchair Mobility;Ambulation PT Plan PT Intensity: Minimum of 1-2 x/day ,45 to 90 minutes PT Frequency: 5 out of 7 days PT Duration Estimated Length of Stay: 17-21 days  PT Treatment/Interventions: Ambulation/gait training;Disease management/prevention;Stair training;Visual/perceptual remediation/compensation;Pain management;Balance/vestibular training;DME/adaptive equipment instruction;Therapeutic Activities;Wheelchair  propulsion/positioning;Patient/family education;Psychosocial support;Therapeutic Exercise;Functional electrical stimulation;Cognitive remediation/compensation;Community reintegration;Functional mobility training;Skin care/wound management;UE/LE Strength taining/ROM;UE/LE Coordination activities;Splinting/orthotics;Neuromuscular re-education;Discharge planning PT Transfers Anticipated Outcome(s): supervision PT Locomotion Anticipated Outcome(s): supervision household gait PT Recommendation Follow Up Recommendations: Home health PT Patient destination: Home Equipment Recommended: To be determined Equipment Details: already has RW  Skilled Therapeutic Intervention  Pt and family instructed patient in PT Evaluation and initiated treatment intervention; see below for results. Pt requires most assistance w/ stand pivot transfers towards R side 2/2 inattention, but min assist when transferring to L. Educated family members on inattention deficits including tips at increasing R attention when pt is not in therapy - sitting on pt's R side, etc. Pt and family educated patient in [place], rehab potential, rehab goals, and discharge recommendations. Additionally discussed putting rail and/or rails up at stairs before d/c as pt will most likely require use of rails to negotiate steps - all in agreement and verbalized understanding. Ended session in w/c, in care of family and all needs met.  PT Evaluation Precautions/Restrictions Precautions Precautions: Fall Restrictions Weight Bearing Restrictions: No Pain Pain Assessment Pain Scale: Faces Pain Score: 0-No pain Faces Pain Scale: No hurt Home Living/Prior Functioning Home Living Available Help at Discharge: Family(family planning to arrange for 24/7 assist and/or supervision) Type of Home: Mobile home Home Access: Stairs to enter Entrance Stairs-Number of Steps: 3 Entrance Stairs-Rails: None Home Layout: One level Bathroom Shower/Tub: Scientist, forensic: Standard Bathroom Accessibility: Yes  Lives With: Family(daughter and 82 year old grandson) Prior Function Level of Independence: Independent with basic ADLs;Independent with transfers;Independent with homemaking with ambulation;Independent with gait(used RW briefly after cardiac surgery but now walking w/o assistance up until stroke)  Able to Take Stairs?: Yes Driving: Yes Vocation: Retired 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 Vision/Perception  Vision - [place] of Motion: Restricted on the right Alignment/Gaze Preference: Within Defined Limits Tracking/Visual Pursuits: Decreased smoothness of eye movement to RIGHT inferior field;Decreased smoothness of eye movement to RIGHT superior field;Impaired - to be further tested in functional context Saccades: Undershoots Convergence: Impaired - to be further tested in functional context Perception Perception: Impaired Inattention/Neglect: Does not attend to right visual field;Does not attend to right side of body Praxis Praxis: Impaired Praxis Impairment Details: Initiation;Motor planning  Cognition Overall Cognitive Status: Impaired/Different from baseline Arousal/Alertness: Awake/alert Orientation Level: Oriented to person;Disoriented to time;Disoriented to situation;Disoriented to place Memory: Impaired Awareness: Impaired Problem Solving: Impaired Safety/Judgment: Impaired Comments: decreased awareness of deficits Sensation Sensation Light Touch: Appears Intact Coordination Gross Motor Movements are Fluid and Coordinated: No Fine Motor Movements are Fluid and Coordinated: No Finger Nose Finger Test: Undershoots, increased shakiness Motor  Motor Motor: Hemiplegia;Motor apraxia Motor - Skilled Clinical Observations: Mild R hemi  Mobility Bed Mobility Bed Mobility: Rolling Left;Rolling Right;Supine to Sit;Sit to Supine Rolling Right:  Supervision/verbal cueing Rolling Left: Supervision/Verbal cueing Supine to Sit: Contact Guard/Touching assist Sit to Supine: Contact Guard/Touching assist Transfers Transfers: Sit to Stand;Stand to Sit;Stand Pivot Transfers Sit to Stand: Minimal Assistance - Patient > 75% Stand to Sit: Minimal Assistance - Patient > 75% Stand Pivot Transfers: Maximal Assistance - Patient 25 - 49% Stand Pivot Transfer Details: Manual facilitation for weight bearing;Manual facilitation for weight shifting;Tactile cues for initiation;Verbal cues for precautions/safety;Manual facilitation for placement Stand Pivot Transfer Details (indicate cue type and reason): Max assist towards hemi (R) side, min assist towards L side Transfer (Assistive device): 1 person hand held assist Locomotion  Gait  Ambulation: Yes Gait Assistance: Moderate Assistance - Patient 50-74% Gait Distance (Feet): 25 Feet Assistive device: 1 person hand held assist Gait Assistance Details: Verbal cues for sequencing;Verbal cues for technique;Manual facilitation for weight shifting;Manual facilitation for placement;Manual facilitation for weight bearing Gait Assistance Details: Mod assist for RLE management, most assist needed w/ swing limb advancement to compensate for hip flexion weakness Gait Gait: Yes Gait Pattern: Impaired Gait Pattern: Poor foot clearance - right;Right hip hike;Right circumduction;Shuffle Gait velocity: decreased Stairs / Additional Locomotion Stairs: No Architect: Yes Wheelchair Assistance: Total Assistance - Patient <25% Wheelchair Propulsion: Both upper extremities Wheelchair Parts Management: Needs assistance Distance: 18'  Trunk/Postural Assessment  Cervical Assessment Cervical Assessment: Exceptions to WFL(forward head rounded shoulder posture) Thoracic Assessment Thoracic Assessment: Exceptions to WFL(kyphotic) Lumbar Assessment Lumbar Assessment: Exceptions to  WFL(posterior pelvic tilt) Postural Control Postural Control: Within Functional Limits  Balance Balance Balance Assessed: Yes Static Sitting Balance Static Sitting - Balance Support: No upper extremity supported;Feet supported Static Sitting - Level of Assistance: 5: Stand by assistance Dynamic Sitting Balance Dynamic Sitting - Balance Support: No upper extremity supported;Feet supported Dynamic Sitting - Level of Assistance: 4: Min assist Static Standing Balance Static Standing - Balance Support: No upper extremity supported;During functional activity Static Standing - Level of Assistance: 4: Min assist Extremity Assessment  RLE Assessment RLE Assessment: Exceptions to [facility] Passive Range of Motion (PROM) Comments: WFL RLE Strength Right Hip Flexion: 3+/5 Right Knee Flexion: 4-/5 Right Knee Extension: 4-/5 Right Ankle Dorsiflexion: 3+/5 Right Ankle Plantar Flexion: 3+/5 LLE Assessment LLE Assessment: Within Functional Limits   See Function Navigator for Current Functional Status.   Refer to Care Plan for Long Term Goals  Recommendations for other services: None   Discharge Criteria: Patient will be discharged from PT if patient refuses treatment 3 consecutive times without medical reason, if treatment goals not met, if there is a change in medical status, if patient makes no progress towards goals or if patient is discharged from hospital.  The above assessment, treatment plan, treatment alternatives and goals were discussed and mutually agreed upon: by patient and by family  Donovan Persley K Arnette 01/28/2018, 10:28 AM

## 2018-01-28 NOTE — IPOC Note (Signed)
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 ([place])   Gait disturbance, post-stroke   Dysphagia, post-stroke     Functional Problem List: Nursing Bladder, Bowel, Edema, Endurance, Medication Management, Pain, Safety, Skin Integrity  PT Balance, Endurance, Motor, Perception, Safety  OT Balance, Cognition, Endurance, Motor, Perception, Safety  SLP Cognition, Motor  TR         Basic ADL's: OT Eating, Grooming, Bathing, Dressing, Toileting     Advanced  ADL's: OT       Transfers: PT Bed Mobility, Bed to Chair, Car, Furniture, Floor  OT Tub/Shower, Agricultural engineer: PT Stairs, Emergency planning/management officer, Ambulation     Additional Impairments: OT Fuctional Use of Upper Extremity  SLP Swallowing, Communication, Social Cognition comprehension, expression Problem Solving  TR      Anticipated Outcomes Item Anticipated Outcome  Self Feeding Supervision  Swallowing  Supervision   Basic self-care  Supervision  Toileting  Supervision   Bathroom Transfers Supervision  Bowel/Bladder  Moderate assist to the BSC/bathroom with cueing for assistance  Transfers  supervision  Locomotion  supervision household gait  Communication  Min A with basic  Cognition  Supervision  Pain  <3 on a 0-10 pain scale  Safety/Judgment  Moderate cueing for calling for assistance and moderate assist with safety device and appropriate judgement   Therapy Plan: PT Intensity: Minimum of 1-2 x/day ,45 to 90 minutes PT Frequency: 5 out of 7 days PT Duration Estimated Length of Stay: 17-21 days  OT Intensity: Minimum of 1-2 x/day, 45 to 90 minutes OT Frequency: 5 out  of 7 days OT Duration/Estimated Length of Stay: 3 weeks SLP Intensity: Minumum of 1-2 x/day, 30 to 90 minutes SLP Frequency: 3 to 5 out of 7 days SLP Duration/Estimated Length of Stay: 3 weeks    Team Interventions: Nursing Interventions Patient/Family Education, Bladder Management, Bowel Management, Skin Care/Wound Management, Disease Management/Prevention, Cognitive Remediation/Compensation, Discharge Planning, Medication Management, Pain Management  PT interventions Ambulation/gait training, Disease management/prevention, Stair training, Visual/perceptual remediation/compensation, Pain management, Training and development officer, DME/adaptive equipment instruction, Therapeutic Activities, Wheelchair propulsion/positioning, Patient/family education, Psychosocial support, Therapeutic Exercise, Functional electrical stimulation, Cognitive remediation/compensation, Community reintegration, Functional mobility training, Skin care/wound management, UE/LE Strength taining/ROM, UE/LE Coordination activities, Splinting/orthotics, Neuromuscular re-education, Discharge planning  OT Interventions Training and development officer, Cognitive remediation/compensation, Academic librarian, Engineer, drilling, Functional mobility training, Neuromuscular re-education, Patient/family education, Psychosocial support, Self Care/advanced ADL retraining, Therapeutic Activities, Therapeutic Exercise, UE/LE Strength taining/ROM, UE/LE Coordination activities, Wheelchair propulsion/positioning  SLP Interventions Cognitive remediation/compensation, Dysphagia/aspiration precaution training, Speech/Language facilitation, Functional tasks, Patient/family education, Therapeutic Activities  TR Interventions    SW/CM Interventions Discharge Planning, Psychosocial Support, Patient/Family Education   Barriers to Discharge MD  Medical stability and aphasia  Nursing Incontinence, Inaccessible home environment, Home  environment access/layout    PT Home environment access/layout 3 steps to enter home w/o rails   OT      SLP      SW       Team Discharge Planning: Destination: PT-Home ,OT- Home , SLP-Home Projected Follow-up: PT-Home health PT, OT-  Home health OT, SLP-24 hour supervision/assistance, Home Health SLP Projected Equipment Needs: PT-To be determined, OT- To be determined, SLP-None recommended by  SLP Equipment Details: PT-already has RW, OT-  Patient/family involved in discharge planning: PT- Patient, Family member/caregiver,  OT-Patient, Family member/caregiver, SLP-Patient, Family member/caregiver  MD ELOS: 29-21d Medical Rehab Prognosis:  Excellent Assessment:  77 year old right-handed female with complex medical history of mitral valve disease/atrial flutterstatus post mitral valve repair 01/07/2018 per Dr. Marcelina Morel was discharged on Coumadin 01/14/2018 as well as history of hypertension, chronic diastolic congestive heart failureand hyperlipidemia. Presented to [facility] 01/24/2018 after being found down by family with right sided weakness and aphasia. Per chart review patient lives with daughter. Had been independent prior to admission without need for assistive device. Cranial CT scan showed large territory acute left MCA infarction without hemorrhage. Hyperdense left internal carotid artery left MCA compatible with acute thrombosis. Patient was discharged to [facility] for ongoing care. INR on admission of 1.7. CT angiogram of head and neck showed left proximal M1 occlusion with thrombus measuring approximately 8 mm in length. Large perfusion penumbra78 cc. Interventional radiology consulted no plan for IR intervention due to high risk of bleeding. Echocardiogram with ejection fraction of 55%. Systolic function was normal. No PFO seen. MRI of the brain 01/25/2017 showed large left MCA territory infarction with petechial hemorrhage and a 2 mm left to right  midline shift. Old left cerebellar infarction. Cardiology service follow-up no plan for TEE.Chronic Coumadin currently remains on hold due to high risk of hemorrhage as per neurology servicesand plan will be to begin Eliquis in place of Coumadin 1 week post stroke if repeat CT does not show signs of hemmorhagic trasnformation. Final note from Cardiology recommends Warfarin rather than Eliquis given that this is valvular ht disease associated with aflutter   Now requiring 24/7 Rehab RN,MD, as well as CIR level PT, OT and SLP.  Treatment team will focus on ADLs and mobility with goals set at Supervision See Team Conference Notes for weekly updates to the plan of care

## 2018-01-28 NOTE — Progress Notes (Signed)
Subjective/Complaints: Aphasic, did state "I do not know".  No new questions per daughter  Review of systems unable to obtain secondary to aphasia  Objective: Vital Signs: Blood pressure (!) 154/65, pulse 74, temperature 99.2 F (37.3 C), temperature source Oral, resp. rate 15, height 5' 3"  (1.6 m), weight 75.3 kg (166 lb 1.7 oz), last menstrual period 11/05/1992, SpO2 98 %. Dg Chest Port 1 View  Result Date: 01/27/2018 CLINICAL DATA:  History of previous CVA. Follow-up atelectasis and small right pneumothorax. EXAM: PORTABLE CHEST 1 VIEW COMPARISON:  PA and lateral chest x-ray of January 13, 2018 FINDINGS: The lungs are adequately inflated. No right-sided pneumothorax is observed. There is a small right pleural effusion. The left lung is mildly hyperinflated. The retrocardiac lung markings are increased. The cardiac silhouette is enlarged. The pulmonary vascularity is engorged. There is a prosthetic valve in the mitral position. There is calcification in the wall of the aortic arch. IMPRESSION: CHF superimposed upon COPD. Small right pleural effusion. Probable bibasilar atelectasis or pneumonia. No pneumothorax. Thoracic aortic atherosclerosis. Electronically Signed   By: David  Martinique M.D.   On: 01/27/2018 08:18   Results for orders placed or performed during the hospital encounter of 01/27/18 (from the past 72 hour(s))  CBC WITH DIFFERENTIAL     Status: Abnormal   Collection Time: 01/28/18  5:18 AM  Result Value Ref Range   WBC 7.4 4.0 - 10.5 K/uL   RBC 3.03 (L) 3.87 - 5.11 MIL/uL   Hemoglobin 9.4 (L) 12.0 - 15.0 g/dL   HCT 29.2 (L) 36.0 - 46.0 %   MCV 96.4 78.0 - 100.0 fL   MCH 31.0 26.0 - 34.0 pg   MCHC 32.2 30.0 - 36.0 g/dL   RDW 13.4 11.5 - 15.5 %   Platelets 271 150 - 400 K/uL   Neutrophils Relative % 68 %   Neutro Abs 5.1 1.7 - 7.7 K/uL   Lymphocytes Relative 17 %   Lymphs Abs 1.2 0.7 - 4.0 K/uL   Monocytes Relative 10 %   Monocytes Absolute 0.7 0.1 - 1.0 K/uL   Eosinophils  Relative 4 %   Eosinophils Absolute 0.3 0.0 - 0.7 K/uL   Basophils Relative 1 %   Basophils Absolute 0.0 0.0 - 0.1 K/uL   Immature Granulocytes 0 %   Abs Immature Granulocytes 0.0 0.0 - 0.1 K/uL    Comment: Performed at [facility], [address], [place], [place] [address]  Comprehensive metabolic panel     Status: Abnormal   Collection Time: 01/28/18  5:18 AM  Result Value Ref Range   Sodium 139 135 - 145 mmol/L   Potassium 3.9 3.5 - 5.1 mmol/L   Chloride 114 (H) 101 - 111 mmol/L   CO2 19 (L) 22 - 32 mmol/L   Glucose, Bld 92 65 - 99 mg/dL   BUN 9 6 - 20 mg/dL   Creatinine, Ser 1.22 (H) 0.44 - 1.00 mg/dL   Calcium 8.6 (L) 8.9 - 10.3 mg/dL   Total Protein 5.3 (L) 6.5 - 8.1 g/dL   Albumin 2.3 (L) 3.5 - 5.0 g/dL   AST 23 15 - 41 U/L   ALT 17 14 - 54 U/L   Alkaline Phosphatase 48 38 - 126 U/L   Total Bilirubin 0.9 0.3 - 1.2 mg/dL   GFR calc non Af Amer 42 (L) >60 mL/min   GFR calc Af Amer 48 (L) >60 mL/min    Comment: (NOTE) The eGFR has been calculated using the  CKD EPI equation. This calculation has not been validated in all clinical situations. eGFR's persistently <60 mL/min signify possible Chronic Kidney Disease.    Anion gap 6 5 - 15    Comment: Performed at [facility] [address], [place], [place] [address]     HEENT: Right facial abrasion and facial droop Cardio: RRR and No murmur Resp: CTA B/L and Unlabored GI: BS positive and Nontender nondistended Extremity:  No Edema Skin:   Other Abrasion right cheek no drainage Neuro: Alert/Oriented, Abnormal Sensory Withdraws to pinch on right side but otherwise cannot assess, Abnormal Motor 3/5 right deltoid bicep tricep grip hip flexor knee extensor ankle dorsiflexor 5/5 in the left side, Abnormal FMC Ataxic/ dec FMC and Aphasic Musc/Skel:  Other No pain with upper limb or lower limb range of motion General no acute distress   Assessment/Plan: 1. 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 Physiatrist is providing close team supervision and 24 hour management of active medical problems listed below. Physiatrist and rehab team continue to assess barriers to discharge/monitor patient progress toward functional and medical goals. FIM: Function - Bathing Position: Shower Body parts bathed by patient: Right arm, Left arm, Chest, Left upper leg, Right upper leg, Front perineal area, Abdomen Body parts bathed by helper: Right lower leg, Left lower leg, Buttocks Assist Level: Touching or steadying assistance(Pt > 75%)  Function- Upper Body Dressing/Undressing What is the patient wearing?: Pull over shirt/dress Pull over shirt/dress - Perfomed by patient: Thread/unthread right sleeve, Put head through opening Pull over shirt/dress - Perfomed by helper: Thread/unthread left sleeve, Pull shirt over trunk Assist Level: Touching or steadying assistance(Pt > 75%) Function - Lower Body Dressing/Undressing What is the patient wearing?: Non-skid slipper socks, Pants Position: Wheelchair/chair at sink Pants- Performed by patient: Thread/unthread left pants leg, Pull pants up/down Pants- Performed by helper: Thread/unthread right pants leg Non-skid slipper socks- Performed by helper: Don/doff right sock, Don/doff left sock Assist for footwear: Partial/moderate assist Assist for lower body dressing: Touching or steadying assistance (Pt > 75%)  Function - Toileting Toileting steps completed by helper: Adjust clothing prior to toileting, Performs perineal hygiene, Adjust clothing after toileting Assist level: Touching or steadying assistance (Pt.75%)  Function - Toilet Transfers Toilet transfer assistive device: Elevated toilet seat/BSC over toilet, Mechanical lift Mechanical lift: Stedy Assist level to toilet: Touching or steadying assistance (Pt >  75%) Assist level from toilet: Touching or steadying assistance (Pt > 75%)  Function - Chair/bed transfer Chair/bed transfer method: Stand pivot Chair/bed transfer assist level: Maximal assist (Pt 25 - 49%/lift and lower) Chair/bed transfer assistive device: Bedrails, Armrests Chair/bed transfer details: Manual facilitation for weight shifting, Manual facilitation for placement, Manual facilitation for weight bearing, Verbal cues for precautions/safety, Verbal cues for safe use of DME/AE, Verbal cues for technique, Verbal cues for sequencing  Function - Locomotion: Wheelchair Will patient use wheelchair at discharge?: Yes(TBD) Type: Manual Max wheelchair distance: 25' Assist Level: Total assistance (Pt < 25%) Wheel 50 feet with 2 turns activity did not occur: Safety/medical concerns Wheel 150 feet activity did not occur: Safety/medical concerns Turns around,maneuvers to table,bed, and toilet,negotiates 3% grade,maneuvers on rugs and over doorsills: No Function - Locomotion: Ambulation Assistive device: Hand held assist Max distance: 25' Assist level: Moderate assist (Pt 50 - 74%) Assist level: Moderate assist (Pt 50 - 74%) Walk 50 feet with 2 turns  activity did not occur: Safety/medical concerns Walk 150 feet activity did not occur: Safety/medical concerns Walk 10 feet on uneven surfaces activity did not occur: Safety/medical concerns  Function - Comprehension Comprehension: Auditory Comprehension assist level: Understands basic 25 - 49% of the time/ requires cueing 50 - 75% of the time  Function - Expression Expression: Verbal, Nonverbal Expression assist level: Expresses basis less than 25% of the time/requires cueing >75% of the time.  Function - Social Interaction Social Interaction assist level: Interacts appropriately 25 - 49% of time - Needs frequent redirection.  Function - Problem Solving Problem solving assist level: Solves basic 25 - 49% of the time - needs direction  more than half the time to initiate, plan or complete simple activities  Function - Memory Memory assist level: Recognizes or recalls less than 25% of the time/requires cueing greater than 75% of the time Patient normally able to recall (first 3 days only): None of the above  Medical Problem List and Plan: 1.Right hemiparesis with expressive aphasiasecondary to left MCA infarction PT OT speech initial eval's today 2. DVT Prophylaxis/Anticoagulation: SCDs. 3. Pain Management:Hydrocodone as needed 4. Mood:Provide emotional support 5. Neuropsych: This patientiscapable of making decisions on herown behalf. 6. Skin/Wound Care:Routine skin checks 7. Fluids/Electrolytes/Nutrition:Routine in and outs with follow-up chemistries, intake not recorded thus far 8.Mitral valve repair 01/07/2018 with atrial flutter. Follow-up cardiology services. Chronic amiodarone on hold as patient's cardiac rate controlled. CHRONIC COUMADINcurrently on hold due to high risk of hemorrhage after recent CVA last Neuro note rec for ELiquis 1 wk post CVA if CT head is neg for hemorrhagic trasnformation, this will be ordered on 6/23.  Since this is valvular A flutter Cardiology not rec Eliquis but resume Warfarin to keep INR >2.0 This was discussed with the patient's daughter 31.Chronic diastolic congestive heart failure. Monitor for any signs of fluid overload 10.Hypertension. Patient on amiodarone 200 mg daily, HCTZ 25 mg daily prior to admission. Resume as needed Vitals:   01/28/18 0437 01/28/18 1500  BP: (!) 164/69 (!) 154/65  Pulse: 73 74  Resp: 15   Temp: 99.2 F (37.3 C)   SpO2: 94% 98%  Will allow permissive hypertension for 1 to 2 weeks post stroke 11.Hyperlipidemia. Lipitor  LOS (Days) 1 A FACE TO FACE EVALUATION WAS PERFORMED  Charlett Blake 01/28/2018, 4:26 PM

## 2018-01-28 NOTE — Evaluation (Signed)
Speech Language Pathology Assessment and Plan  Patient Details  Name: Brooke Chavez MRN: [id number] Date of Birth: [birth year]  SLP Diagnosis: Aphasia;Dysphagia;Apraxia;Cognitive Impairments  Rehab Potential: Good ELOS: 3 weeks    Today's Date: 01/28/2018 SLP Individual Time: 4193-7902 SLP Individual Time Calculation (min): 60 min   Problem List:  Patient Active Problem List   Diagnosis Date Noted  . 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 Hemiparesis of right dominant side as late effect of cerebral infarction ([place])   . 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 Gait disturbance, post-stroke   . Dysphagia, post-stroke   . Advance care planning   . Goals of care, counseling/discussion   . Palliative care by specialist   . Acute ischemic left MCA stroke ([place]) 01/24/2018  . Stroke (cerebrum) ([place]) 01/24/2018  . Encounter for therapeutic drug monitoring 01/18/2018  . 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 Chronic diastolic congestive heart failure ([place])   . 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 Liver masses 11/13/2017  . BMI 30.0-30.9,adult 07/24/2015  . Osteoporosis 09/19/2014  . GERD (gastroesophageal reflux disease) 07/29/2011  . Atrial flutter ([place]) 05/29/2010  . Hyperlipidemia with target LDL less than 100 01/04/2009  . Essential hypertension 01/04/2009   Past Medical History:  Past Medical History:  Diagnosis Date  . Atrial flutter ([place])    Typical by EKG diagnosis 9/11 s/p CIT ablation 11/11  . Bradycardia   . Chronic diastolic congestive heart failure ([place])   . DJD (degenerative joint disease)   . Dysrhythmia   . Femur fracture, left ([place]) 2013  . Heart murmur   . Hyperlipidemia    x5 years  . Hypertension    Since 1997  . Liver masses 11/13/2017   Multiple small nodules seen on CT and MRI  . Mitral regurgitation    severe  . 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 S/P minimally invasive mitral valve repair  01/07/2018   Complex valvuloplasty including artificial Gore-tex neochord placement x6 and 28 mm Sorin Memo 4D ring annuloplasty via right mini thoracotomy approach  . TR (tricuspid regurgitation)    Mild with RA enlargment   Past Surgical History:  Past Surgical History:  Procedure Laterality Date  . A FLUTTER ABLATION N/A 06/27/2011   Procedure: ABLATION A FLUTTER;  Surgeon: Thompson Grayer, MD;  Location: [facility] CATH LAB;  Service: Cardiovascular;  Laterality: N/A;  . ATRIAL ABLATION SURGERY  06/2010  . HIP SURGERY     Left (fracture) 3/13  . JOINT REPLACEMENT     both knees   . KNEE ARTHROSCOPY     Right  . LUMBAR SPINE SURGERY    . MITRAL VALVE REPAIR Right 01/07/2018   Procedure: MINIMALLY INVASIVE MITRAL VALVE REPAIR using LivaNova ring size 28 MM;  Surgeon: Rexene Alberts, MD;  Location: [facility];  Service: Open Heart Surgery;  Laterality: Right;  . PATENT FORAMEN OVALE(PFO) CLOSURE N/A 01/07/2018   Procedure: PATENT FORAMEN OVALE (PFO) CLOSURE;  Surgeon: Rexene Alberts, MD;  Location: [facility];  Service: Open Heart Surgery;  Laterality: N/A;  . RIGHT/LEFT HEART CATH AND CORONARY ANGIOGRAPHY N/A 11/11/2017   Procedure: RIGHT/LEFT HEART CATH AND CORONARY ANGIOGRAPHY;  Surgeon: Sherren Mocha, MD;  Location: [facility] CV LAB;  Service: Cardiovascular;  Laterality: N/A;  . TEE WITHOUT CARDIOVERSION N/A 09/18/2017   Procedure: TRANSESOPHAGEAL ECHOCARDIOGRAM (TEE);  Surgeon: Skeet Latch, MD;  Location: [facility];  Service: Cardiovascular;  Laterality: N/A;  . TEE  WITHOUT CARDIOVERSION N/A 01/07/2018   Procedure: TRANSESOPHAGEAL ECHOCARDIOGRAM (TEE);  Surgeon: Rexene Alberts, MD;  Location: [facility];  Service: Open Heart Surgery;  Laterality: N/A;  . TOTAL KNEE ARTHROPLASTY     Left  . TUBAL LIGATION      Assessment / Plan / Recommendation Clinical Impression Brooke Chavez a 77 year old right-handed female with complex medical history of mitral valve disease/atrial flutterstatus  post mitral valve repair 01/07/2018 per Dr. Marcelina Morel was discharged on Coumadin 01/14/2018 as well as history of hypertension, chronic diastolic congestive heart failureand hyperlipidemia. Presented to [facility] 01/24/2018 after being found down by family with right sided weakness and aphasia. Per chart review patient lives with daughter. Had been independent prior to admission without need for assistive device. Cranial CT scan showed large territory acute left MCA infarction without hemorrhage. Hyperdense left internal carotid artery left MCA compatible with acute thrombosis. Patient was [facility] for ongoing care. INR on admission of 1.7. CT angiogram of head and neck showed left proximal M1 occlusion with thrombus measuring approximately 8 mm in length. Large perfusion [id number] cc. Interventional radiology consulted no plan for IR intervention due to high risk of bleeding. Echocardiogram with ejection fraction of 55%. Systolic function was normal. MRI of the brain 01/25/2017 showed large left MCA territory infarction with petechial hemorrhage and a 2 mm left to right midline shift. Old left cerebellar infarction. Cardiology service follow-up no plan for TEE.Patient was admitted for a comprehensive rehab program on 6/19 with comprehensive speech-language evaluation as well as bedside swallow evaluation. Pt presents with severe expressive aphasia that is c/b fluent garbled speech, apraxia and severe flaccidity on right. Receptively, pt is not able to follow 1 step directions, answer yes/no questions or select requested objects. She does appear to understand social interactions and is moderately effective in communicating emotions with facial expressions. Complete cognitive assessment is difficult at this time d/t aphasia. Will continue to provide ongoing assessment. Pt also demonstrates mild to moderate oral phase dysphagia d/t right pocketing and requires Max A for  lingual sweeps with trials of regular. Therefore recommend  pt remain on dysphagia 3 diet with thin liquids, full nursing supervision to check for right pocketing. Skilled ST is required to address the mentioned deficits, increase independence and reduce caregiver burden. Pt will likely need Min A at discharge with follow up [place].     Skilled Therapeutic Interventions          Skilled treatment session focused on completion of above mentioned evaluations. SLP further facilitated session by providing Max A for expression of rote information such as singing Happy Rudene Anda, days of week and months of year. Pt unable to produce any consonants/vowels with frequent oral gropping noted. Pt able to match picture to object with Mod A cues to achieve ~ 50-75% accuracy. Despite Max A cues, pt was not consistent in answering basic yes/no questions related to herself. Education provided to daughter on speech-language deficits, inability to scan to right and current diet.    SLP Assessment  Patient will need skilled Speech Lanaguage Pathology Services during CIR admission    Recommendations  SLP Diet Recommendations: Dysphagia 3 (Mech soft);Thin Liquid Administration via: Cup;Straw Medication Administration: Whole meds with liquid(as tolerated) Supervision: Full supervision/cueing for compensatory strategies;Trained caregiver to feed patient;Staff to assist with self feeding Compensations: Slow rate;Small sips/bites;Monitor for anterior loss;Lingual sweep for clearance of pocketing;Follow solids with liquid;Minimize environmental distractions Postural Changes and/or Swallow Maneuvers: Seated upright 90 degrees Oral  Care Recommendations: Oral care BID Patient destination: Home Follow up Recommendations: 24 hour supervision/assistance;Home Health SLP Equipment Recommended: None recommended by SLP    SLP Frequency 3 to 5 out of 7 days   SLP Duration  SLP Intensity  SLP Treatment/Interventions 3  weeks  Minumum of 1-2 x/day, 30 to 90 minutes  Cognitive remediation/compensation;Dysphagia/aspiration precaution training;Speech/Language facilitation;Functional tasks;Patient/family education;Therapeutic Activities    Pain Pain Assessment Pain Scale: Faces Faces Pain Scale: No hurt  Prior Functioning Cognitive/Linguistic Baseline: Within functional limits Type of Home: Mobile home  Lives With: Family;Daughter Available Help at Discharge: Family Vocation: Retired  Function:  Eating Eating   Modified Consistency Diet: Yes Eating Assist Level: More than reasonable amount of time;Set up assist for;Supervision or verbal cues;Helper checks for pocketed food   Eating Set Up Assist For: Opening containers       Cognition Comprehension Comprehension assist level: Understands basic 25 - 49% of the time/ requires cueing 50 - 75% of the time  Expression   Expression assist level: Expresses basis less than 25% of the time/requires cueing >75% of the time.  Social Interaction Social Interaction assist level: Interacts appropriately 25 - 49% of time - Needs frequent redirection.  Problem Solving Problem solving assist level: Solves basic 25 - 49% of the time - needs direction more than half the time to initiate, plan or complete simple activities  Memory Memory assist level: Recognizes or recalls less than 25% of the time/requires cueing greater than 75% of the time   Short Term Goals: Week 1: SLP Short Term Goal 1 (Week 1): Pt will match picture to object in field of 2 objects with ~ 90% accuracy and Mod A cues.  SLP Short Term Goal 2 (Week 1): Pt will answer simple yes/no questions with ~ 75% accuracy and Max A cues.  SLP Short Term Goal 3 (Week 1): Pt will follow 1 step basic directions related to self in 8 out of 10 opportunities with Max A cues.  SLP Short Term Goal 4 (Week 1): Pt will imitate consonant vowel combinations with 25% accuracy and Max A cues.  SLP Short Term Goal 5  (Week 1): Pt will consume regular textures with Min A cues for use of compensatory swallow strategies for complete oral clearing to demonstrate readiness for diet upgrade.   Refer to Care Plan for Long Term Goals  Recommendations for other services: None   Discharge Criteria: Patient will be discharged from SLP if patient refuses treatment 3 consecutive times without medical reason, if treatment goals not met, if there is a change in medical status, if patient makes no progress towards goals or if patient is discharged from hospital.  The above assessment, treatment plan, treatment alternatives and goals were discussed and mutually agreed upon: by patient and by family  Dalena Plantz 01/28/2018, 2:06 PM

## 2018-01-28 NOTE — Care Management Note (Signed)
[facility] Individual Statement of Services  Patient Name:  Brooke Chavez  Date:  01/28/2018  Welcome to the [facility].  Our goal is to provide you with an individualized program based on your diagnosis and situation, designed to meet your specific needs.  With this comprehensive rehabilitation program, you will be expected to participate in at least 3 hours of rehabilitation therapies Monday-Friday, with modified therapy programming on the weekends.  Your rehabilitation program will include the following services:  Physical Therapy (PT), Occupational Therapy (OT), Speech Therapy (ST), 24 hour per day rehabilitation nursing, Therapeutic Recreaction (TR), Case Management (Social Worker), Rehabilitation Medicine, Nutrition Services and Pharmacy Services  Weekly team conferences will be held on Wednesday to discuss your progress.  Your Social Worker will talk with you frequently to get your input and to update you on team discussions.  Team conferences with you and your family in attendance may also be held.  Expected length of stay: 17-21 Days  Overall anticipated outcome: supervision with cueing  Depending on your progress and recovery, your program may change. Your Social Worker will coordinate services and will keep you informed of any changes. Your Social Worker's name and contact numbers are listed  below.  The following services may also be recommended but are not provided by the [facility] will be made to provide these services after discharge if needed.  Arrangements include referral to agencies that provide these services.  Your insurance has been verified to be:  Medicare & Commerical Your primary doctor is:  [name]  Pertinent information will be shared with your  doctor and your insurance company.  Social Worker:  Ovidio Kin, [place] or ([phone]  Information discussed with and copy given to patient by: [name], 01/28/2018, 11:06 AM

## 2018-01-28 NOTE — Progress Notes (Signed)
Social Work  Social Work Assessment and Plan  Patient Details  Name: Brooke Chavez MRN: [id number] Date of Birth: [birth year]  Today's Date: 01/28/2018  Problem List:  Patient Active Problem List   Diagnosis Date Noted  . 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 Hemiparesis of right dominant side as late effect of cerebral infarction ([place])   . 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 Gait disturbance, post-stroke   . Dysphagia, post-stroke   . Advance care planning   . Goals of care, counseling/discussion   . Palliative care by specialist   . Acute ischemic left MCA stroke ([place]) 01/24/2018  . Stroke (cerebrum) ([place]) 01/24/2018  . Encounter for therapeutic drug monitoring 01/18/2018  . 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 Chronic diastolic congestive heart failure ([place])   . 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 Liver masses 11/13/2017  . BMI 30.0-30.9,adult 07/24/2015  . Osteoporosis 09/19/2014  . GERD (gastroesophageal reflux disease) 07/29/2011  . Atrial flutter ([place]) 05/29/2010  . Hyperlipidemia with target LDL less than 100 01/04/2009  . Essential hypertension 01/04/2009   Past Medical History:  Past Medical History:  Diagnosis Date  . Atrial flutter ([place])    Typical by EKG diagnosis 9/11 s/p CIT ablation 11/11  . Bradycardia   . Chronic diastolic congestive heart failure ([place])   . DJD (degenerative joint disease)   . Dysrhythmia   . Femur fracture, left ([place]) 2013  . Heart murmur   . Hyperlipidemia    x5 years  . Hypertension    Since 1997  . Liver masses 11/13/2017   Multiple small nodules seen on CT and MRI  . Mitral regurgitation    severe  . 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 TR (tricuspid  regurgitation)    Mild with RA enlargment   Past Surgical History:  Past Surgical History:  Procedure Laterality Date  . A FLUTTER ABLATION N/A 06/27/2011   Procedure: ABLATION A FLUTTER;  Surgeon: Thompson Grayer, MD;  Location: [facility] CATH LAB;  Service: Cardiovascular;  Laterality: N/A;  . ATRIAL ABLATION SURGERY  06/2010  . HIP SURGERY     Left (fracture) 3/13  . JOINT REPLACEMENT     both knees   . KNEE ARTHROSCOPY     Right  . LUMBAR SPINE SURGERY    . MITRAL VALVE REPAIR Right 01/07/2018   Procedure: MINIMALLY INVASIVE MITRAL VALVE REPAIR using LivaNova ring size 28 MM;  Surgeon: Rexene Alberts, MD;  Location: [place];  Service: Open Heart Surgery;  Laterality: Right;  . PATENT FORAMEN OVALE(PFO) CLOSURE N/A 01/07/2018   Procedure: PATENT FORAMEN OVALE (PFO) CLOSURE;  Surgeon: Rexene Alberts, MD;  Location: [place];  Service: Open Heart Surgery;  Laterality: N/A;  . RIGHT/LEFT HEART CATH AND CORONARY ANGIOGRAPHY N/A 11/11/2017   Procedure: RIGHT/LEFT HEART CATH AND CORONARY ANGIOGRAPHY;  Surgeon: Sherren Mocha, MD;  Location: [place] CV LAB;  Service: Cardiovascular;  Laterality: N/A;  . TEE WITHOUT CARDIOVERSION N/A 09/18/2017   Procedure: TRANSESOPHAGEAL ECHOCARDIOGRAM (TEE);  Surgeon: Skeet Latch, MD;  Location: [place];  Service: Cardiovascular;  Laterality: N/A;  . TEE WITHOUT CARDIOVERSION N/A 01/07/2018   Procedure: TRANSESOPHAGEAL ECHOCARDIOGRAM (TEE);  Surgeon: Rexene Alberts, MD;  Location: [place];  Service: Open Heart  Surgery;  Laterality: N/A;  . TOTAL KNEE ARTHROPLASTY     Left  . TUBAL LIGATION     Social History:  reports that she has never smoked. She has never used smokeless tobacco. She reports that she does not drink alcohol or use drugs.  Family / Support Systems Marital Status: Widow/Widower Patient Roles: Parent, Other (Comment)(employee) Children: [name] Martin-daughter [phone] [name] Martin-daughter [phone] Other Supports: Barrie Dunker [phone] Anticipated Caregiver: Family all will pull together to provide assist Ability/Limitations of Caregiver: Daughter's work but will make arrangements for someone to be with pt-numerous family members Caregiver Availability: 24/7 Family Dynamics: Close knit family pt has three children and grandchildren along with extended family and friends. Pt feels she has good support and will be well taken care of at home. She is grateful for her family but would do the same for them.  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   Abuse/Neglect Abuse/Neglect Assessment Can Be Completed: Yes Physical Abuse: Denies Verbal Abuse: Denies Sexual Abuse: Denies Exploitation of patient/patient's resources: Denies Self-Neglect: Denies  Emotional Status Pt's affect, behavior adn adjustment status: Pt is motivated to recover from this stroke and will do her best. She has always been independent and actually the caregiver from others. She will work hard and see what she can accomplish while here. Recent Psychosocial Issues: other health issues were being managed by PCP Pyschiatric History: No history deferred depression screen due to adjusting to the new unit and therapies. She is tired from her am therapies. Will monitor and see if would benefit from seeing neuro-psych while here. Substance Abuse History: No issues  Patient / Family Perceptions, Expectations & Goals Pt/Family understanding of illness & functional limitations: Pt and family can explain her stroke and deficits. They do talk with the MD and feel they have  a good understanding of her plan going forward. Pt is shy and quiet and allows others to talk for her encouraged her to use her voice. Premorbid pt/family roles/activities: Mom, grandmother, housekeeper, church member, etc Anticipated changes in roles/activities/participation: resume if possible Pt/family expectations/goals: Pt states: " I want to be able to do for myself, I am not used to others doing for me."  Daughter states: " I hope she will progress here but we all will help."  Son states: ' Whatever Momma needs we will do."  [employer]: None Premorbid Home Care/DME Agencies: Other (Comment)(has equipment from [employer]) Transportation available at discharge: Family Resource referrals recommended: Support group (specify)  Discharge Planning Living Arrangements: Children Support Systems: Children, Other relatives, Friends/neighbors, Social worker community Type of Residence: Private residence Insurance Resources: [employer], Multimedia programmer (specify)(Commerical Ins) Museum/gallery curator Resources: Employment, Radio broadcast assistant Screen Referred: No Living Expenses: Lives with family Money Management: Patient, Family Does the patient have any problems obtaining your medications?: No Home Management: She and daughter-Nicole Patient/Family Preliminary Plans: Return home with Nicole-daughter whom she lives with and have others coming in to assist while [name] works. Family is aware she first will need someone with her at [place] and they are arranging this. Will await evaluations and work on a safe plan for pt.  Social Work Anticipated Follow Up Needs: HH/OP, Support  Group  Clinical Impression Pleasant female who is motivated and a Scientist, research (physical sciences), who is usually the one taking care of others, not the one needing care. She has a very involved and supportive family who are willing to provide 24 hr care. Will await therapy team evaluations and work on discharge  needs.  [name] 01/28/2018, 11:03 AM

## 2018-01-28 NOTE — Progress Notes (Signed)
Patient information reviewed and entered into eRehab system by Ethon Wymer, RN, CRRN, PPS Coordinator.  Information including medical coding and functional independence measure will be reviewed and updated through discharge.     Per nursing patient was given "Data Collection Information Summary for Patients in Inpatient Rehabilitation Facilities with attached "Privacy Act Statement-Health Care Records" upon admission.  

## 2018-01-28 NOTE — Evaluation (Signed)
Occupational Therapy Assessment and Plan  Patient Details  Name: Brooke Chavez MRN: [id number] Date of Birth: 1940/11/17  OT Diagnosis: apraxia, ataxia, cognitive deficits, hemiplegia affecting dominant side and muscle weakness (generalized) Rehab Potential: Rehab Potential (ACUTE ONLY): Excellent ELOS: 3 weeks   Today's Date: 01/28/2018 OT Individual Time: 0802-0900 OT Individual Time Calculation (min): 58 min     Problem List:  Patient Active Problem List   Diagnosis Date Noted  . 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 Hemiparesis of right dominant side as late effect of cerebral infarction ([place])   . 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 Gait disturbance, post-stroke   . Dysphagia, post-stroke   . Advance care planning   . Goals of care, counseling/discussion   . Palliative care by specialist   . Acute ischemic left MCA stroke ([place]) 01/24/2018  . Stroke (cerebrum) ([place]) 01/24/2018  . Encounter for therapeutic drug monitoring 01/18/2018  . 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 Chronic diastolic congestive heart failure ([place])   . 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 Liver masses 11/13/2017  . BMI 30.0-30.9,adult 07/24/2015  . Osteoporosis 09/19/2014  . GERD (gastroesophageal reflux disease) 07/29/2011  . Atrial flutter ([place]) 05/29/2010  . Hyperlipidemia with target LDL less than 100 01/04/2009  . Essential hypertension 01/04/2009    Past Medical History:  Past Medical History:  Diagnosis Date  . Atrial flutter ([place])    Typical by EKG diagnosis 9/11 s/p CIT ablation 11/11  . Bradycardia   . Chronic diastolic congestive heart failure ([place])   . DJD (degenerative joint disease)   . Dysrhythmia   . Femur fracture, left ([place]) 2013  . Heart murmur   . Hyperlipidemia    x5 years  . Hypertension    Since 1997  . Liver masses 11/13/2017   Multiple small nodules seen on CT and MRI  . Mitral regurgitation     severe  . 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 TR (tricuspid regurgitation)    Mild with RA enlargment   Past Surgical History:  Past Surgical History:  Procedure Laterality Date  . A FLUTTER ABLATION N/A 06/27/2011   Procedure: ABLATION A FLUTTER;  Surgeon: Thompson Grayer, MD;  Location: [facility] CATH LAB;  Service: Cardiovascular;  Laterality: N/A;  . ATRIAL ABLATION SURGERY  06/2010  . HIP SURGERY     Left (fracture) 3/13  . JOINT REPLACEMENT     both knees   . KNEE ARTHROSCOPY     Right  . LUMBAR SPINE SURGERY    . MITRAL VALVE REPAIR Right 01/07/2018   Procedure: MINIMALLY INVASIVE MITRAL VALVE REPAIR using LivaNova ring size 28 MM;  Surgeon: Rexene Alberts, MD;  Location: [place];  Service: Open Heart Surgery;  Laterality: Right;  . PATENT FORAMEN OVALE(PFO) CLOSURE N/A 01/07/2018   Procedure: PATENT FORAMEN OVALE (PFO) CLOSURE;  Surgeon: Rexene Alberts, MD;  Location: [place];  Service: Open Heart Surgery;  Laterality: N/A;  . RIGHT/LEFT HEART CATH AND CORONARY ANGIOGRAPHY N/A 11/11/2017   Procedure: RIGHT/LEFT HEART CATH AND CORONARY ANGIOGRAPHY;  Surgeon: Sherren Mocha, MD;  Location: [place] CV LAB;  Service: Cardiovascular;  Laterality: N/A;  . TEE WITHOUT CARDIOVERSION N/A 09/18/2017   Procedure: TRANSESOPHAGEAL ECHOCARDIOGRAM (TEE);  Surgeon: Skeet Latch,  MD;  Location: [place];  Service: Cardiovascular;  Laterality: N/A;  . TEE WITHOUT CARDIOVERSION N/A 01/07/2018   Procedure: TRANSESOPHAGEAL ECHOCARDIOGRAM (TEE);  Surgeon: Rexene Alberts, MD;  Location: [place];  Service: Open Heart Surgery;  Laterality: N/A;  . TOTAL KNEE ARTHROPLASTY     Left  . TUBAL LIGATION      Assessment & Plan Clinical Impression: Patient is a 77 y.o. year old female with recent admission to  the hospital on6/16/2019 with aphasia, noted to have right-sided weakness, right hemianopsia, right facial droop and was found to have an acute ischemic MCA stroke in the setting of recent mitral valve repair. Patient was transferred to [facility] for possible endovascular intervention of the left M1 occlusion however, once transported to [facility], the patient's family decided not to pursue IR procedure given the increased risk of bleeding. Family at bedside deny patient having complaints of chest pain, palpitations, any recent infection, long distance travel, dizziness or syncope. MRI of the brain 01/25/2017 showed large left MCA territory infarction with petechial hemorrhage and a 2 mm left to right midline shift.  Old left cerebellar infarction. Patient transferred to CIR on 01/27/2018 .    Patient currently requires mod/max with basic self-care skills secondary to muscle weakness, impaired timing and sequencing, unbalanced muscle activation, motor apraxia, ataxia, decreased coordination and decreased motor planning, decreased midline orientation, decreased attention to right, right side neglect and ideational apraxia, decreased initiation, decreased attention, decreased awareness, decreased problem solving, decreased safety awareness, decreased memory and delayed processing and decreased sitting balance, decreased standing balance, decreased postural control, hemiplegia and decreased balance strategies.  Prior to hospitalization, patient could complete BADL with independent .  Patient will benefit from skilled intervention to increase independence with basic self-care skills prior to discharge home with care partner.  Anticipate patient will require 24 hour supervision and follow up home health.  OT - End of Session Endurance Deficit: Yes Endurance Deficit Description: decreased OT Assessment Rehab Potential (ACUTE ONLY): Excellent OT Patient demonstrates impairments in the following area(s):  Balance;Cognition;Endurance;Motor;Perception;Safety OT Basic ADL's Functional Problem(s): Eating;Grooming;Bathing;Dressing;Toileting OT Transfers Functional Problem(s): Tub/Shower;Toilet OT Additional Impairment(s): Fuctional Use of Upper Extremity OT Plan OT Intensity: Minimum of 1-2 x/day, 45 to 90 minutes OT Frequency: 5 out of 7 days OT Duration/Estimated Length of Stay: 3 weeks OT Treatment/Interventions: Balance/vestibular training;Cognitive remediation/compensation;Community reintegration;DME/adaptive equipment instruction;Functional mobility training;Neuromuscular re-education;Patient/family education;Psychosocial support;Self Care/advanced ADL retraining;Therapeutic Activities;Therapeutic Exercise;UE/LE Strength taining/ROM;UE/LE Coordination activities;Wheelchair propulsion/positioning OT Self Feeding Anticipated Outcome(s): Supervision OT Basic Self-Care Anticipated Outcome(s): Supervision OT Toileting Anticipated Outcome(s): Supervision OT Bathroom Transfers Anticipated Outcome(s): Supervision OT Recommendation Patient destination: Home Follow Up Recommendations: Home health OT Equipment Recommended: To be determined  Skilled Therapeutic Intervention OT eval completed addressing rehab process, OT purpose, POC, ELOS, and goals. Treatment provided to address functional transfers, R attention, functional use of R UE, improved sit<>stand, standing tolerance, and adapted bathing/dressing skills. Pt came to sitting EOB with mod A and increased time to initiate. Stedy used to transfer pt into shower with min A to stand and multimodal cues to maintain midline as pt has tendency to lean to the R. Incorporated R NMR weight bearing techniques within bathing tasks with hand over hand A for 75%. Pt incontinent of bowel and bladder while in the shower.  LB/UB dressing completed wc level at the sink with assistance to thread R LE into clothing and assist to pull up pants. UB dressing with min A  overall 2/2 R hemiplegia and apraxia. Incorporate weight bearing  through R UE when standing at the sink.  Pt was mostly non-verbal throughout session but was able to follow about 50% of commands within familiar task. Pt with increased difficulty with unfamiliar task such as "finger-to-nose" test. Pt also demonstrated ideational apraxia, R inattention, and R ataxia within BADL tasks. Pt left seated in wc at end of session with all bell in reach, chair alarm on, and daughter present.  OT Evaluation Precautions/Restrictions  Precautions Precautions: Fall Restrictions Weight Bearing Restrictions: No Pain Pain Assessment Pain Scale: Faces Faces Pain Scale: No hurt Home Living/Prior Functioning Home Living Family/patient expects to be discharged to:: Private residence Living Arrangements: Children Available Help at Discharge: Family(family planning to arrange for 24/7 assist and/or supervision) Type of Home: Mobile home Home Access: Stairs to enter Entrance Stairs-Number of Steps: 3 Entrance Stairs-Rails: None Home Layout: One level Bathroom Shower/Tub: Tub/shower unit(and walk-in shower) Bathroom Toilet: Programmer, systems: Yes  Lives With: Family(daughter and 65 year old grandson) Prior Function Level of Independence: Independent with basic ADLs, Independent with homemaking with ambulation  Able to Take Stairs?: Yes Driving: Yes Vocation: Retired 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 ADL ADL ADL Comments: Please see functional navigator Vision Baseline Vision/History: Wears glasses Wears Glasses: At all times Patient Visual Report: Diplopia Vision Assessment?: Vision impaired- to be further tested in functional context;Yes Ocular Range of Motion: Restricted on the right Alignment/Gaze Preference: Within Defined Limits Tracking/Visual Pursuits: Decreased smoothness of eye movement to RIGHT inferior field;Decreased  smoothness of eye movement to RIGHT superior field;Impaired - to be further tested in functional context Saccades: Undershoots Convergence: Impaired - to be further tested in functional context Visual Fields: No apparent deficits(Able to attend to R visual field w/ verbal cues) Perception  Perception: Impaired Inattention/Neglect: Does not attend to right visual field;Does not attend to right side of body Praxis Praxis: Impaired Praxis Impairment Details: Initiation;Motor planning Cognition Overall Cognitive Status: Impaired/Different from baseline Arousal/Alertness: Awake/alert Orientation Level: Nonverbal/unable to assess Year: Other (Comment)(nonverbal/UTA) Month: (nonverbal/UTA) Day of Week: (nonverbal/UTA) Memory: Impaired(nonverbal/UTA) Immediate Memory Recall: (nonverbal/UTA) Awareness: Impaired Problem Solving: Impaired Safety/Judgment: Impaired Comments: decreased awareness of deficits Sensation Sensation Light Touch: Appears Intact Coordination Gross Motor Movements are Fluid and Coordinated: No Fine Motor Movements are Fluid and Coordinated: No Coordination and Movement Description: apraxia, decreased accuracy and smoothness Finger Nose Finger Test: Undershooting, difficulty understannding commands Motor  Motor Motor: Hemiplegia;Motor apraxia;Ataxia Motor - Skilled Clinical Observations: Mild R hemi Mobility  Bed Mobility Bed Mobility: Rolling Left;Rolling Right;Supine to Sit;Sit to Supine Rolling Right: Supervision/verbal cueing Rolling Left: Supervision/Verbal cueing Supine to Sit: Contact Guard/Touching assist Sit to Supine: Contact Guard/Touching assist Transfers Sit to Stand: Minimal Assistance - Patient > 75% Stand to Sit: Minimal Assistance - Patient > 75%  Trunk/Postural Assessment  Cervical Assessment Cervical Assessment: Exceptions to WFL(forward head rounded shoulder posture) Thoracic Assessment Thoracic Assessment: Exceptions to  WFL(kyphotic) Lumbar Assessment Lumbar Assessment: Exceptions to WFL(posterior pelvic tilt) Postural Control Postural Control: Within Functional Limits  Balance Balance Balance Assessed: Yes Static Sitting Balance Static Sitting - Balance Support: No upper extremity supported;Feet supported Static Sitting - Level of Assistance: 5: Stand by assistance Dynamic Sitting Balance Dynamic Sitting - Balance Support: No upper extremity supported;Feet supported Dynamic Sitting - Level of Assistance: 4: Min assist Static Standing Balance Static Standing - Balance Support: No upper extremity supported;During functional activity Static Standing - Level of Assistance: 4: Min assist Dynamic Standing Balance Dynamic Standing - Balance Support:  During functional activity Dynamic Standing - Level of Assistance: 3: Mod assist Extremity/Trunk Assessment RUE Assessment RUE Assessment: Exceptions to [facility] RUE Body System: Neuro Brunstrum levels for arm and hand: Hand;Arm Brunstrum level for arm: Stage IV Movement is deviating from synergy Brunstrum level for hand: Stage IV Movements deviating from synergies LUE Assessment LUE Assessment: Within Functional Limits   See Function Navigator for Current Functional Status.   Refer to Care Plan for Long Term Goals  Recommendations for other services: None    Discharge Criteria: Patient will be discharged from OT if patient refuses treatment 3 consecutive times without medical reason, if treatment goals not met, if there is a change in medical status, if patient makes no progress towards goals or if patient is discharged from hospital.  The above assessment, treatment plan, treatment alternatives and goals were discussed and mutually agreed upon: by patient and by family  Valma Cava 01/28/2018, 1:27 PM

## 2018-01-28 NOTE — Telephone Encounter (Signed)
Ms. Keenum daughter, [name] called and had questions about Ms. Bok's coumadin.  Specifically how often she was getting her INR checked and how much she was to take.  I advised her that [facility] and Dr. Roxy Manns does not manage her INR checks or coumadin medication.  I advised her to contact her Cardiologist in reference to this information.  She acknowledged receipt.

## 2018-01-28 NOTE — Plan of Care (Signed)
  Problem: Consults Goal: RH STROKE PATIENT EDUCATION Description See Patient Education module for education specifics  Outcome: Progressing   Problem: RH BOWEL ELIMINATION Goal: RH STG MANAGE BOWEL WITH ASSISTANCE Description STG Manage Bowel with moderate Assistance.  Outcome: Progressing Goal: RH STG MANAGE BOWEL W/MEDICATION W/ASSISTANCE Description STG Manage Bowel with Medication and min Assistance.  Outcome: Progressing   Problem: RH BLADDER ELIMINATION Goal: RH STG MANAGE BLADDER WITH ASSISTANCE Description STG Manage Bladder With moderate Assistance  Outcome: Progressing Goal: RH STG MANAGE BLADDER WITH EQUIPMENT WITH ASSISTANCE Description STG Manage Bladder With Equipment With moderate Assistance  Outcome: Progressing   Problem: RH SKIN INTEGRITY Goal: RH STG SKIN FREE OF INFECTION/BREAKDOWN Description Skin to remain free from infection or breakdown while on rehab with min assist  Outcome: Progressing Goal: RH STG MAINTAIN SKIN INTEGRITY WITH ASSISTANCE Description STG Maintain Skin Integrity With min Assistance.  Outcome: Progressing Goal: RH STG ABLE TO PERFORM INCISION/WOUND CARE W/ASSISTANCE Description STG Able To Perform MASD Care With moderate Assistance.  Outcome: Progressing   Problem: RH SAFETY Goal: RH STG ADHERE TO SAFETY PRECAUTIONS W/ASSISTANCE/DEVICE Description STG Adhere to Safety Precautions With moderate Assistance and appropriate assistive Device.  Outcome: Progressing Goal: RH STG DECREASED RISK OF FALL WITH ASSISTANCE Description STG Decreased Risk of Fall With moderate Assistance.  Outcome: Progressing   Problem: RH PAIN MANAGEMENT Goal: RH STG PAIN MANAGED AT OR BELOW PT'S PAIN GOAL Description <3 on a 0-10 pain scale  Outcome: Progressing   

## 2018-01-29 ENCOUNTER — Inpatient Hospital Stay (HOSPITAL_COMMUNITY): Payer: Medicare Other | Admitting: Physical Therapy

## 2018-01-29 ENCOUNTER — Inpatient Hospital Stay (HOSPITAL_COMMUNITY): Payer: Medicare Other | Admitting: Occupational Therapy

## 2018-01-29 ENCOUNTER — Inpatient Hospital Stay (HOSPITAL_COMMUNITY): Payer: Medicare Other | Admitting: Speech Pathology

## 2018-01-29 MED ORDER — BACIT-POLY-NEO HC 1 % EX OINT
TOPICAL_OINTMENT | Freq: Two times a day (BID) | CUTANEOUS | Status: DC
  Filled 2018-01-29 (×2): qty 15

## 2018-01-29 MED ORDER — BACITRACIN-NEOMYCIN-POLYMYXIN OINTMENT TUBE
TOPICAL_OINTMENT | Freq: Two times a day (BID) | CUTANEOUS | Status: DC
  Administered 2018-01-29 – 2018-02-10 (×22): via TOPICAL
  Filled 2018-01-29: qty 14

## 2018-01-29 NOTE — Progress Notes (Signed)
Occupational Therapy Session Note  Patient Details  Name: Brooke Chavez MRN: [id number] Date of Birth: 01-22-1941  Today's Date: 01/29/2018 OT Individual Time: 1000-1100 OT Individual Time Calculation (min): 60 min   Short Term Goals: Week 1:  OT Short Term Goal 1 (Week 1): Pt will look to the R to locate 1/4 grooming items with mod questioning cues. OT Short Term Goal 2 (Week 1): Pt will complete UB dressing using hemi techniques with mod questioning cues OT Short Term Goal 3 (Week 1): Pt will complete toilet transfer with Mod A consistent Mod A OT Short Term Goal 4 (Week 1): Pt will integrate R UE into bathing taks with no more than mod questioning cues  Skilled Therapeutic Interventions/Progress Updates:    Pt greeted sitting in wc with daughter present and agreeable to OT. Dressing completed wc at the sink. Educated pt on hemi dressing techniques with pt needing max multimodal cues to initiate dressing R side first 2/2 apraxia and difficulty comprehending OTs directions. Multiple sit<>stands at the sink with verbal and tactile cues for hand placement prior to stand. Brought pt to therapy gym and completed stand-pivot to therapy mat with mod A. Provided pt with medium red theraputty and worked on R attention and functional use of R UE. Focused on grip, pinch, and in-hand manipulation of objects. Pt needed guided A to pick hand up and out of container when placing putty. Pt returned to wc in similar fashion and returned to room. Pt left seated in wc with safety belt and family present.  Therapy Documentation Precautions:  Precautions Precautions: Fall Restrictions Weight Bearing Restrictions: No Pain:  none/denies pain ADL: ADL ADL Comments: Please see functional navigator  See Function Navigator for Current Functional Status.   Therapy/Group: Individual Therapy  Valma Cava 01/29/2018, 11:04 AM

## 2018-01-29 NOTE — Progress Notes (Signed)
Speech Language Pathology Daily Session Note  Patient Details  Name: Brooke Chavez MRN: [id number] Date of Birth: 02-25-1941  Today's Date: 01/29/2018 SLP Individual Time: 0700-0800 SLP Individual Time Calculation (min): 60 min  Short Term Goals: Week 1: SLP Short Term Goal 1 (Week 1): Pt will match picture to object in field of 2 objects with ~ 90% accuracy and Mod A cues.  SLP Short Term Goal 2 (Week 1): Pt will answer simple yes/no questions with ~ 75% accuracy and Max A cues.  SLP Short Term Goal 3 (Week 1): Pt will follow 1 step basic directions related to self in 8 out of 10 opportunities with Max A cues.  SLP Short Term Goal 4 (Week 1): Pt will imitate consonant vowel combinations with 25% accuracy and Max A cues.  SLP Short Term Goal 5 (Week 1): Pt will consume regular textures with Min A cues for use of compensatory swallow strategies for complete oral clearing to demonstrate readiness for diet upgrade.   Skilled Therapeutic Interventions: Skilled treatment session focused on dysphagia and communication goals. SLP facilitated session by providing Mod A verbal cues for use of a slow rate of self-feeding and to alternate between solids and liquids to clear mild-moderate right lateral sulci pocketing with breakfast meal of Dys. 3 textures with thin liquids. Patient consumed meal without overt s/s of aspiration. SLP also facilitated session by providing Max A verbal cues for patient to name functional items with 50% accuracy. However, patient independently matched a picture to an object from a field of 2 with 100% accuracy. Patient left upright in wheelchair with family present. Continue with current plan of care.      Function:  Eating Eating   Modified Consistency Diet: Yes Eating Assist Level: More than reasonable amount of time;Set up assist for;Supervision or verbal cues;Helper checks for pocketed food   Eating Set Up Assist For: Opening containers;Cutting food        Cognition Comprehension Comprehension assist level: Understands basic 50 - 74% of the time/ requires cueing 25 - 49% of the time  Expression   Expression assist level: Expresses basis less than 25% of the time/requires cueing >75% of the time.  Social Interaction Social Interaction assist level: Interacts appropriately 50 - 74% of the time - May be physically or verbally inappropriate.  Problem Solving Problem solving assist level: Solves basic 25 - 49% of the time - needs direction more than half the time to initiate, plan or complete simple activities  Memory Memory assist level: Recognizes or recalls less than 25% of the time/requires cueing greater than 75% of the time    Pain No/Denies Pain   Therapy/Group: Individual Therapy  Dejia Ebron 01/29/2018, 11:11 AM

## 2018-01-29 NOTE — Plan of Care (Signed)
  Problem: Consults Goal: RH STROKE PATIENT EDUCATION Description See Patient Education module for education specifics  Outcome: Progressing   Problem: RH BOWEL ELIMINATION Goal: RH STG MANAGE BOWEL WITH ASSISTANCE Description STG Manage Bowel with moderate Assistance.  Outcome: Progressing Goal: RH STG MANAGE BOWEL W/MEDICATION W/ASSISTANCE Description STG Manage Bowel with Medication and min Assistance.  Outcome: Progressing   Problem: RH BLADDER ELIMINATION Goal: RH STG MANAGE BLADDER WITH ASSISTANCE Description STG Manage Bladder With moderate Assistance  Outcome: Progressing Goal: RH STG MANAGE BLADDER WITH EQUIPMENT WITH ASSISTANCE Description STG Manage Bladder With Equipment With moderate Assistance  Outcome: Progressing   Problem: RH SKIN INTEGRITY Goal: RH STG SKIN FREE OF INFECTION/BREAKDOWN Description Skin to remain free from infection or breakdown while on rehab with min assist  Outcome: Progressing Goal: RH STG MAINTAIN SKIN INTEGRITY WITH ASSISTANCE Description STG Maintain Skin Integrity With min Assistance.  Outcome: Progressing Goal: RH STG ABLE TO PERFORM INCISION/WOUND CARE W/ASSISTANCE Description STG Able To Perform MASD Care With moderate Assistance.  Outcome: Progressing   Problem: RH SAFETY Goal: RH STG ADHERE TO SAFETY PRECAUTIONS W/ASSISTANCE/DEVICE Description STG Adhere to Safety Precautions With moderate Assistance and appropriate assistive Device.  Outcome: Progressing Goal: RH STG DECREASED RISK OF FALL WITH ASSISTANCE Description STG Decreased Risk of Fall With moderate Assistance.  Outcome: Progressing   Problem: RH PAIN MANAGEMENT Goal: RH STG PAIN MANAGED AT OR BELOW PT'S PAIN GOAL Description <3 on a 0-10 pain scale  Outcome: Progressing   

## 2018-01-29 NOTE — Progress Notes (Signed)
Physical Therapy Session Note  Patient Details  Name: Brooke Chavez MRN: [id number] Date of Birth: 11/30/1940  Today's Date: 01/29/2018 PT Individual Time: 1545-1650 PT Individual Time Calculation (min): 65 min   Short Term Goals: Week 1:  PT Short Term Goal 1 (Week 1): Pt will transfer bed<>chair w/ mod assist towards R (hemi) side PT Short Term Goal 2 (Week 1): Pt will ambulate 25' w/ min assist w/ LRAD PT Short Term Goal 3 (Week 1): Pt will attend to R environment during functional mobility w/ min cues 50% of the time PT Short Term Goal 4 (Week 1): Pt will self-propel w/c 25' w/ min assist  Skilled Therapeutic Interventions/Progress Updates:   Pt in w/c and agreeable to therapy, denies pain. Family present throughout session. Focused on standing balance, postural control, and R attention. Performed unilateral and bilateral UE tasks in standing w/ intermittent bouts of UE support w/ fatigue. Tasks required crossing midline to R and finding objects on R side. Able to keep neutral posture for 2-3 min before RLE fatigued to point of losing balance to R side. Performed NuStep 10 min @ level 1 for reciprocal movement pattern w/ manual assist for neutral RLE alignment and increasing attention to RUE and RLE. Additionally worked on ambulating w/ RW. Pt w/ much improved ability to sequence use of RW this session compared to yesterday's attempt. Ambulated 50', 150', and 100' w/ RW and min assist w/ verbal and visual cues for increased R step length and R hip flexion. Returned to room and ended session in supine and in care of family, all needs met.   Therapy Documentation Precautions:  Precautions Precautions: Fall Restrictions Weight Bearing Restrictions: No Vital Signs: Therapy Vitals Temp: 97.7 F (36.5 C) Temp Source: Oral Pulse Rate: (!) 43 Resp: 18 BP: (!) 145/57 Patient Position (if appropriate): Sitting Oxygen Therapy SpO2: 100 % O2 Device: Room Air  See Function Navigator for  Current Functional Status.   Therapy/Group: Individual Therapy  Amy K Arnette 01/29/2018, 5:06 PM  

## 2018-01-29 NOTE — Progress Notes (Signed)
Subjective/Complaints: Spoke to son and daughter about purpose of anticoagultion (stroke prevention rather than dissolving MCA thrombus  Review of systems unable to obtain secondary to aphasia  Objective: Vital Signs: Blood pressure (!) 154/68, pulse 75, temperature 99 F (37.2 C), temperature source Oral, resp. rate 16, height 5' 3" (1.6 m), weight 75.4 kg (166 lb 3.6 oz), last menstrual period 11/05/1992, SpO2 99 %. No results found. Results for orders placed or performed during the hospital encounter of 01/27/18 (from the past 72 hour(s))  CBC WITH DIFFERENTIAL     Status: Abnormal   Collection Time: 01/28/18  5:18 AM  Result Value Ref Range   WBC 7.4 4.0 - 10.5 K/uL   RBC 3.03 (L) 3.87 - 5.11 MIL/uL   Hemoglobin 9.4 (L) 12.0 - 15.0 g/dL   HCT 29.2 (L) 36.0 - 46.0 %   MCV 96.4 78.0 - 100.0 fL   MCH 31.0 26.0 - 34.0 pg   MCHC 32.2 30.0 - 36.0 g/dL   RDW 13.4 11.5 - 15.5 %   Platelets 271 150 - 400 K/uL   Neutrophils Relative % 68 %   Neutro Abs 5.1 1.7 - 7.7 K/uL   Lymphocytes Relative 17 %   Lymphs Abs 1.2 0.7 - 4.0 K/uL   Monocytes Relative 10 %   Monocytes Absolute 0.7 0.1 - 1.0 K/uL   Eosinophils Relative 4 %   Eosinophils Absolute 0.3 0.0 - 0.7 K/uL   Basophils Relative 1 %   Basophils Absolute 0.0 0.0 - 0.1 K/uL   Immature Granulocytes 0 %   Abs Immature Granulocytes 0.0 0.0 - 0.1 K/uL    Comment: Performed at [facility], [address] [place], [place] [address]  Comprehensive metabolic panel     Status: Abnormal   Collection Time: 01/28/18  5:18 AM  Result Value Ref Range   Sodium 139 135 - 145 mmol/L   Potassium 3.9 3.5 - 5.1 mmol/L   Chloride 114 (H) 101 - 111 mmol/L   CO2 19 (L) 22 - 32 mmol/L   Glucose, Bld 92 65 - 99 mg/dL   BUN 9 6 - 20 mg/dL   Creatinine, Ser 1.22 (H) 0.44 - 1.00 mg/dL   Calcium 8.6 (L) 8.9 - 10.3 mg/dL   Total Protein 5.3 (L) 6.5 - 8.1 g/dL   Albumin 2.3 (L) 3.5 - 5.0 g/dL   AST 23 15 - 41 U/L   ALT 17 14 - 54 U/L   Alkaline Phosphatase 48 38 - 126 U/L   Total Bilirubin 0.9 0.3 - 1.2 mg/dL   GFR calc non Af Amer 42 (L) >60 mL/min   GFR calc Af Amer 48 (L) >60 mL/min    Comment: (NOTE) The eGFR has been calculated using the CKD EPI equation. This calculation has not been validated in all clinical situations. eGFR's persistently <60 mL/min signify possible Chronic Kidney Disease.    Anion gap 6 5 - 15    Comment: Performed at [facility] [address] [place], [place] [address]     HEENT: Right facial abrasion and facial droop Cardio: RRR and No murmur Resp: CTA B/L and Unlabored GI: BS positive and Nontender nondistended Extremity:  No Edema Skin:   Other Abrasion right cheek no drainage Neuro: Alert/Oriented, Abnormal Sensory Withdraws to pinch on right side but otherwise cannot assess, Abnormal Motor 3/5 right deltoid bicep tricep grip hip flexor knee extensor ankle dorsiflexor 5/5 in the left side, Abnormal FMC Ataxic/ dec FMC and  Aphasic Musc/Skel:  Other No pain with upper limb or lower limb range of motion General no acute distress   Assessment/Plan: 1. 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 Physiatrist is providing close team supervision and 24 hour management of active medical problems listed below. Physiatrist and rehab team continue to assess barriers to discharge/monitor patient progress toward functional and medical goals. FIM: Function - Bathing Position: Shower Body parts bathed by patient: Right arm, Left arm, Chest, Left upper leg, Right upper leg, Front perineal area, Abdomen Body parts bathed by helper: Right lower leg, Left lower leg, Buttocks Assist Level: Touching or steadying assistance(Pt > 75%)  Function- Upper Body Dressing/Undressing What is the patient wearing?: Pull over shirt/dress Pull over shirt/dress -  Perfomed by patient: Thread/unthread left sleeve, Put head through opening Pull over shirt/dress - Perfomed by helper: Pull shirt over trunk, Thread/unthread right sleeve Assist Level: Touching or steadying assistance(Pt > 75%) Function - Lower Body Dressing/Undressing What is the patient wearing?: Pants Position: Wheelchair/chair at sink Pants- Performed by patient: Thread/unthread right pants leg, Thread/unthread left pants leg, Pull pants up/down Pants- Performed by helper: Thread/unthread right pants leg Non-skid slipper socks- Performed by helper: Don/doff right sock, Don/doff left sock Assist for footwear: Partial/moderate assist Assist for lower body dressing: Touching or steadying assistance (Pt > 75%)  Function - Toileting Toileting steps completed by helper: Adjust clothing prior to toileting, Performs perineal hygiene, Adjust clothing after toileting Assist level: Touching or steadying assistance (Pt.75%)  Function - Toilet Transfers Toilet transfer assistive device: Elevated toilet seat/BSC over toilet, Mechanical lift Mechanical lift: Stedy Assist level to toilet: Touching or steadying assistance (Pt > 75%) Assist level from toilet: Touching or steadying assistance (Pt > 75%)  Function - Chair/bed transfer Chair/bed transfer method: Stand pivot Chair/bed transfer assist level: Maximal assist (Pt 25 - 49%/lift and lower) Chair/bed transfer assistive device: Bedrails, Armrests Chair/bed transfer details: Manual facilitation for weight shifting, Manual facilitation for placement, Manual facilitation for weight bearing, Verbal cues for precautions/safety, Verbal cues for safe use of DME/AE, Verbal cues for technique, Verbal cues for sequencing  Function - Locomotion: Wheelchair Will patient use wheelchair at discharge?: Yes(TBD) Type: Manual Max wheelchair distance: 25' Assist Level: Total assistance (Pt < 25%) Wheel 50 feet with 2 turns activity did not occur:  Safety/medical concerns Wheel 150 feet activity did not occur: Safety/medical concerns Turns around,maneuvers to table,bed, and toilet,negotiates 3% grade,maneuvers on rugs and over doorsills: No Function - Locomotion: Ambulation Assistive device: Hand held assist Max distance: 25' Assist level: Moderate assist (Pt 50 - 74%) Assist level: Moderate assist (Pt 50 - 74%) Walk 50 feet with 2 turns activity did not occur: Safety/medical concerns Walk 150 feet activity did not occur: Safety/medical concerns Walk 10 feet on uneven surfaces activity did not occur: Safety/medical concerns  Function - Comprehension Comprehension: Auditory Comprehension assist level: Understands basic 50 - 74% of the time/ requires cueing 25 - 49% of the time  Function - Expression Expression: Verbal, Nonverbal Expression assist level: Expresses basis less than 25% of the time/requires cueing >75% of the time.  Function - Social Interaction Social Interaction assist level: Interacts appropriately 50 - 74% of the time - May be physically or verbally inappropriate.  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 DVT Prophylaxis/Anticoagulation: SCDs. 3. Pain Management:Hydrocodone as needed 4. Mood:Provide emotional support 5. Neuropsych: This patientiscapable of making decisions on herown behalf. 6. Skin/Wound Care:Routine skin checks 7. 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 Follow-up cardiology services. Chronic amiodarone on hold as patient's cardiac rate controlled. CHRONIC COUMADINcurrently on hold due to high risk of hemorrhage after recent CVA last Neuro note rec for ELiquis 1 wk post CVA if CT head is neg for hemorrhagic trasnformation, this will be ordered on 6/23.  Since this is valvular A flutter Cardiology not rec Eliquis but resume Warfarin to keep INR >2.0 This was discussed with the patient's daughter 980Chronic diastolic congestive heart failure. Monitor for any signs of fluid overload No peripheral edema or SOB 6/21 10.Hypertension. Patient on amiodarone 200 mg daily, HCTZ 25 mg daily prior to admission. Resume as needed Vitals:   01/28/18 2026 01/29/18 0448  BP: (!) 155/56 (!) 154/68  Pulse: 70 75  Resp: 18 16  Temp: 99 F (37.2 C) 99 F (37.2 C)  SpO2: 100% 99%  Will allow permissive hypertension for 1 to 2 weeks post stroke. CVA onset 6/16 11.Hyperlipidemia. Lipitor  LOS (Days) 2 A FACE TO FACE EVALUATION WAS PERFORMED  ACharlett Blake6/21/2019, 12:58 PM

## 2018-01-30 ENCOUNTER — Inpatient Hospital Stay (HOSPITAL_COMMUNITY): Payer: Medicare Other | Admitting: Physical Therapy

## 2018-01-30 ENCOUNTER — Inpatient Hospital Stay (HOSPITAL_COMMUNITY): Payer: Medicare Other

## 2018-01-30 ENCOUNTER — Inpatient Hospital Stay (HOSPITAL_COMMUNITY): Payer: Medicare Other | Admitting: Speech Pathology

## 2018-01-30 ENCOUNTER — Inpatient Hospital Stay (HOSPITAL_COMMUNITY): Payer: Medicare Other | Admitting: Occupational Therapy

## 2018-01-30 NOTE — Progress Notes (Signed)
Speech Language Pathology Daily Session Note  Patient Details  Name: Brooke Chavez MRN: [id number] Date of Birth: 12/11/1940  Today's Date: 01/30/2018 SLP Individual Time: 1330-1400 SLP Individual Time Calculation (min): 30 min  Short Term Goals: Week 1: SLP Short Term Goal 1 (Week 1): Pt will match picture to object in field of 2 objects with ~ 90% accuracy and Mod A cues.  SLP Short Term Goal 2 (Week 1): Pt will answer simple yes/no questions with ~ 75% accuracy and Max A cues.  SLP Short Term Goal 3 (Week 1): Pt will follow 1 step basic directions related to self in 8 out of 10 opportunities with Max A cues.  SLP Short Term Goal 4 (Week 1): Pt will imitate consonant vowel combinations with 25% accuracy and Max A cues.  SLP Short Term Goal 5 (Week 1): Pt will consume regular textures with Min A cues for use of compensatory swallow strategies for complete oral clearing to demonstrate readiness for diet upgrade.   Skilled Therapeutic Interventions: Skilled treatment session focused on cognition goals. SLP facilitated session by providing supervision cues when pt placing colored blocks on pattern. Pt able to demonstrate sustained attention to task independently and with more than a reasonable amount of time, able to locate blocks/pattern that were slightly to right of midline. Pt able to trace simple shapes but was not able to identify yes/no in written form. Education continued to be provided to daughter. Pt left upright in wheelchair with family present. Continue per current plan of care.      Function:    Cognition Comprehension Comprehension assist level: Understands basic 75 - 89% of the time/ requires cueing 10 - 24% of the time  Expression   Expression assist level: Expresses basic 25 - 49% of the time/requires cueing 50 - 75% of the time. Uses single words/gestures.  Social Interaction Social Interaction assist level: Interacts appropriately 75 - 89% of the time - Needs redirection  for appropriate language or to initiate interaction.  Problem Solving Problem solving assist level: Solves basic 50 - 74% of the time/requires cueing 25 - 49% of the time  Memory Memory assist level: Recognizes or recalls less than 25% of the time/requires cueing greater than 75% of the time    Pain    Therapy/Group: Individual Therapy  Johnluke Haugen 01/30/2018, 2:21 PM

## 2018-01-30 NOTE — Progress Notes (Signed)
Subjective/Complaints: Niece died, family doesn't want to upset pt with news  Review of systems unable to obtain secondary to aphasia  Objective: Vital Signs: Blood pressure 138/66, pulse (!) 58, temperature 98.2 F (36.8 C), temperature source Oral, resp. rate 16, height _0  (1.6 m), weight 75.4 kg (166 lb 3.6 oz), last menstrual period 11/05/1992, SpO2 97 %. No results found. Results for orders placed or performed during the hospital encounter of 01/27/18 (from the past 72 hour(s))  CBC WITH DIFFERENTIAL     Status: Abnormal   Collection Time: 01/28/18  5:18 AM  Result Value Ref Range   WBC 7.4 4.0 - 10.5 K/uL   RBC 3.03 (L) 3.87 - 5.11 MIL/uL   Hemoglobin 9.4 (L) 12.0 - 15.0 g/dL   HCT 29.2 (L) 36.0 - 46.0 %   MCV 96.4 78.0 - 100.0 fL   MCH 31.0 26.0 - 34.0 pg   MCHC 32.2 30.0 - 36.0 g/dL   RDW 13.4 11.5 - 15.5 %   Platelets 271 150 - 400 K/uL   Neutrophils Relative % 68 %   Neutro Abs 5.1 1.7 - 7.7 K/uL   Lymphocytes Relative 17 %   Lymphs Abs 1.2 0.7 - 4.0 K/uL   Monocytes Relative 10 %   Monocytes Absolute 0.7 0.1 - 1.0 K/uL   Eosinophils Relative 4 %   Eosinophils Absolute 0.3 0.0 - 0.7 K/uL   Basophils Relative 1 %   Basophils Absolute 0.0 0.0 - 0.1 K/uL   Immature Granulocytes 0 %   Abs Immature Granulocytes 0.0 0.0 - 0.1 K/uL    Comment: Performed at [facility], [address] [place], [place] [address]  Comprehensive metabolic panel     Status: Abnormal   Collection Time: 01/28/18  5:18 AM  Result Value Ref Range   Sodium 139 135 - 145 mmol/L   Potassium 3.9 3.5 - 5.1 mmol/L   Chloride 114 (H) 101 - 111 mmol/L   CO2 19 (L) 22 - 32 mmol/L   Glucose, Bld 92 65 - 99 mg/dL   BUN 9 6 - 20 mg/dL   Creatinine, Ser 1.22 (H) 0.44 - 1.00 mg/dL   Calcium 8.6 (L) 8.9 - 10.3 mg/dL   Total Protein 5.3 (L) 6.5 - 8.1 g/dL   Albumin 2.3 (L) 3.5 - 5.0 g/dL   AST 23 15 - 41 U/L   ALT 17 14 - 54 U/L   Alkaline Phosphatase 48 38 - 126 U/L   Total Bilirubin 0.9 0.3  - 1.2 mg/dL   GFR calc non Af Amer 42 (L) >60 mL/min   GFR calc Af Amer 48 (L) >60 mL/min    Comment: (NOTE) The eGFR has been calculated using the CKD EPI equation. This calculation has not been validated in all clinical situations. eGFR's persistently <60 mL/min signify possible Chronic Kidney Disease.    Anion gap 6 5 - 15    Comment: Performed at [facility] [address], [place],  [address]     HEENT: Right facial abrasion and facial droop Cardio: RRR and No murmur Resp: CTA B/L and Unlabored GI: BS positive and Nontender nondistended Extremity:  No Edema Skin:   Other Abrasion right cheek no drainage Neuro: Alert/Oriented, Abnormal Sensory Withdraws to pinch on right side but otherwise cannot assess, Abnormal Motor 3/5 right deltoid bicep tricep grip hip flexor knee extensor ankle dorsiflexor 5/5 in the left side, Abnormal FMC Ataxic/ dec FMC and Aphasic Musc/Skel:  Other No  pain with upper limb or lower limb range of motion General no acute distress   Assessment/Plan: 1. 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 Physiatrist is providing close team supervision and 24 hour management of active medical problems listed below. Physiatrist and rehab team continue to assess barriers to discharge/monitor patient progress toward functional and medical goals. FIM: Function - Bathing Position: Shower Body parts bathed by patient: Right arm, Left arm, Chest, Left upper leg, Right upper leg, Front perineal area, Abdomen Body parts bathed by helper: Right lower leg, Left lower leg, Buttocks Assist Level: Touching or steadying assistance(Pt > 75%)  Function- Upper Body Dressing/Undressing What is the patient wearing?: Pull over shirt/dress Pull over shirt/dress - Perfomed by patient: Thread/unthread left sleeve, Put head  through opening Pull over shirt/dress - Perfomed by helper: Pull shirt over trunk, Thread/unthread right sleeve Assist Level: Touching or steadying assistance(Pt > 75%) Function - Lower Body Dressing/Undressing What is the patient wearing?: Pants Position: Wheelchair/chair at sink Pants- Performed by patient: Thread/unthread right pants leg, Thread/unthread left pants leg, Pull pants up/down Pants- Performed by helper: Thread/unthread right pants leg Non-skid slipper socks- Performed by helper: Don/doff right sock, Don/doff left sock Assist for footwear: Partial/moderate assist Assist for lower body dressing: Touching or steadying assistance (Pt > 75%)  Function - Toileting Toileting steps completed by helper: Adjust clothing prior to toileting, Performs perineal hygiene, Adjust clothing after toileting Toileting Assistive Devices: Grab bar or rail Assist level: Touching or steadying assistance (Pt.75%)  Function - Air cabin crew transfer assistive device: Elevated toilet seat/BSC over toilet, Mechanical lift Mechanical lift: Stedy Assist level to toilet: Touching or steadying assistance (Pt > 75%) Assist level from toilet: Touching or steadying assistance (Pt > 75%)  Function - Chair/bed transfer Chair/bed transfer method: Stand pivot Chair/bed transfer assist level: Touching or steadying assistance (Pt > 75%) Chair/bed transfer assistive device: Armrests Chair/bed transfer details: Manual facilitation for weight shifting, Manual facilitation for placement, Manual facilitation for weight bearing, Verbal cues for precautions/safety, Verbal cues for safe use of DME/AE, Verbal cues for technique, Verbal cues for sequencing  Function - Locomotion: Wheelchair Will patient use wheelchair at discharge?: Yes(TBD) Type: Manual Max wheelchair distance: 25' Assist Level: Total assistance (Pt < 25%) Wheel 50 feet with 2 turns activity did not occur: Safety/medical concerns Wheel 150  feet activity did not occur: Safety/medical concerns Turns around,maneuvers to table,bed, and toilet,negotiates 3% grade,maneuvers on rugs and over doorsills: No Function - Locomotion: Ambulation Assistive device: Walker-rolling Max distance: 150' Assist level: Touching or steadying assistance (Pt > 75%) Assist level: Touching or steadying assistance (Pt > 75%) Walk 50 feet with 2 turns activity did not occur: Safety/medical concerns Assist level: Touching or steadying assistance (Pt > 75%) Walk 150 feet activity did not occur: Safety/medical concerns Walk 10 feet on uneven surfaces activity did not occur: Safety/medical concerns  Function - Comprehension Comprehension: Auditory Comprehension assist level: Understands basic 50 - 74% of the time/ requires cueing 25 - 49% of the time  Function - Expression Expression: Verbal, Nonverbal Expression assist level: Expresses basis less than 25% of the time/requires cueing >75% of the time.  Function - Social Interaction Social Interaction assist level: Interacts appropriately 50 - 74% of the time - May be physically or verbally inappropriate.  Function - Problem Solving Problem solving assist level: Solves basic 25 - 49% of the time -  needs direction more than half the time to initiate, plan or complete simple activities  Function - Memory Memory assist level: Recognizes or recalls less than 25% of the time/requires cueing greater than 75% of the time Patient normally able to recall (first 3 days only): None of the above  Medical Problem List and Plan: 1.Right hemiparesis with expressive aphasiasecondary to left MCA infarction PT OT speech CIR level 2. DVT Prophylaxis/Anticoagulation: SCDs. 3. Pain Management:Hydrocodone as needed 4. Mood:Provide emotional support 5. Neuropsych: This patientiscapable of making decisions on herown behalf. 6. Skin/Wound Care:Routine skin checks 7. 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 Follow-up cardiology services. Chronic amiodarone on hold as patient's cardiac rate controlled. CHRONIC COUMADINcurrently on hold due to high risk of hemorrhage after recent CVA last Neuro note rec for ELiquis 1 wk post CVA if CT head is neg for hemorrhagic trasnformation, this will be ordered on 6/23.  Since this is valvular A flutter Cardiology not rec Eliquis but resume Warfarin to keep INR >2.0 Repeat CT scan brain in am This was discussed with the patient's daughter 922Chronic diastolic congestive heart failure. Monitor for any signs of fluid overload No peripheral edema or SOB 6/22 10.Hypertension. Patient on amiodarone 200 mg daily, HCTZ 25 mg daily prior to admission. Resume as needed Vitals:   01/29/18 2207 01/30/18 0500  BP: (!) 148/62 138/66  Pulse:  (!) 58  Resp: 16 16  Temp:  98.2 F (36.8 C)  SpO2:  97%  Will allow permissive hypertension for 1 to 2 weeks post stroke. CVA onset 6/16 11.Hyperlipidemia. Lipitor 12.  Hx Afib but amiodarone held per cardiology due to junctional rythmn, asymptomatic LOS (Days) 3 A FACE TO FACE EVALUATION WAS PERFORMED  ACharlett Blake6/22/2019, 10:10 AM

## 2018-01-30 NOTE — Progress Notes (Signed)
Physical Therapy Session Note  Patient Details  Name: Brooke Chavez MRN: [id number] Date of Birth: 07-09-1941  Today's Date: 01/30/2018 PT Individual Time: 5784-6962 PT Individual Time Calculation (min): 55 min   Short Term Goals: Week 1:  PT Short Term Goal 1 (Week 1): Pt will transfer bed<>chair w/ mod assist towards R (hemi) side PT Short Term Goal 2 (Week 1): Pt will ambulate 25' w/ min assist w/ LRAD PT Short Term Goal 3 (Week 1): Pt will attend to R environment during functional mobility w/ min cues 50% of the time PT Short Term Goal 4 (Week 1): Pt will self-propel w/c 25' w/ min assist  Skilled Therapeutic Interventions/Progress Updates:   Pt in w/c and agreeable to therapy, denies pain. Session focused on gait training, functional endurance, and RLE NMR. Pt ambulated to/from therapy gym w/ min assist using RW. Manual facilitation of lateral weight shifting w/ R swing limb advancement and verbal and visual cues for increased R step length and R hip flexion. Practiced ambulating w/ R toe up for increased R toe clearance, but continues to ambulate w/ shuffle 2/2 hip flexor weakness. Performed R hip flexion activation in sitting and stance including knee marches to visual target and stepping to 2" step in standing. Max verbal cues 2/2 apraxia but consistently successful after a few reps. Performed Berg Balance Scale as detailed below, explained significance of results to pt and daughter including increased fall risk at this point in time. Returned to room and ended session in w/c and in care of family, all needs met.   Therapy Documentation Precautions:  Precautions Precautions: Fall Restrictions Weight Bearing Restrictions: No Vital Signs: Therapy Vitals Pulse Rate: (!) 52 Resp: 17 BP: (!) 156/60 Patient Position (if appropriate): Sitting Oxygen Therapy SpO2: 100 % O2 Device: Room Air Pain: Pain Assessment Pain Scale: 0-10 Pain Score: 0-No pain Balance: Standardized Balance  Assessment Standardized Balance Assessment: Berg Balance Test Berg Balance Test Sit to Stand: Able to stand without using hands and stabilize independently Standing Unsupported: Able to stand safely 2 minutes Sitting with Back Unsupported but Feet Supported on Floor or Stool: Able to sit safely and securely 2 minutes Stand to Sit: Sits safely with minimal use of hands Transfers: Able to transfer safely, definite need of hands Standing Unsupported with Eyes Closed: Able to stand 10 seconds with supervision Standing Ubsupported with Feet Together: Needs help to attain position but able to stand for 30 seconds with feet together From Standing, Reach Forward with Outstretched Arm: Reaches forward but needs supervision From Standing Position, Pick up Object from Floor: Able to pick up shoe, needs supervision From Standing Position, Turn to Look Behind Over each Shoulder: Turn sideways only but maintains balance Turn 360 Degrees: Needs close supervision or verbal cueing Standing Unsupported, Alternately Place Feet on Step/Stool: Needs assistance to keep from falling or unable to try Standing Unsupported, One Foot in Front: Needs help to step but can hold 15 seconds Standing on One Leg: Unable to try or needs assist to prevent fall Total Score: 31  See Function Navigator for Current Functional Status.   Therapy/Group: Individual Therapy  Derrion Tritz K Arnette 01/30/2018, 4:57 PM

## 2018-01-30 NOTE — Progress Notes (Signed)
Occupational Therapy Session Note  Patient Details  Name: Brooke Chavez MRN: [id number] Date of Birth: 1941/01/30  Today's Date: 01/30/2018 OT Individual Time: 1410-1456 OT Individual Time Calculation (min): 46 min    Short Term Goals: Week 1:  OT Short Term Goal 1 (Week 1): Pt will look to the R to locate 1/4 grooming items with mod questioning cues. OT Short Term Goal 2 (Week 1): Pt will complete UB dressing using hemi techniques with mod questioning cues OT Short Term Goal 3 (Week 1): Pt will complete toilet transfer with Mod A consistent Mod A OT Short Term Goal 4 (Week 1): Pt will integrate R UE into bathing taks with no more than mod questioning cues  Skilled Therapeutic Interventions/Progress Updates:    Pt sitting up in w/c with no c/o pain and family present. Session focused on ADL transfers in/out of walk in shower to simulate home environment, as well as safety awareness. Extensive discussion/education with family and pt re safety awareness and fall prevention in the home, with several recommendations provided. Demo was provided re types of shower chairs/benches to use and indications of each. Pt practiced transfer in/out of simulated walk in shower with RW, requiring mod A for balance support and mod vc for UE/LE placement. Pt then returned to w/c and completed leg lifts over simulated "shower lip" with R and L LE, x20 each with manual facilitation for demonstration provided for pt to comprehend task demands. Min lifting A required to clear ledge with R LE. Pt was returned to room and left sitting up in w/c with family present.   Therapy Documentation Precautions:  Precautions Precautions: Fall Restrictions Weight Bearing Restrictions: No Vital Signs: Therapy Vitals Pulse Rate: (!) 52 Resp: 17 BP: (!) 156/60 Patient Position (if appropriate): Sitting Oxygen Therapy SpO2: 100 % O2 Device: Room Air Pain: Pain Assessment Pain Scale: 0-10 Pain Score: 0-No  pain ADL: ADL ADL Comments: Please see functional navigator  See Function Navigator for Current Functional Status.   Therapy/Group: Individual Therapy  Curtis Sites 01/30/2018, 3:41 PM

## 2018-01-30 NOTE — Progress Notes (Signed)
Occupational Therapy Session Note  Patient Details  Name: Brooke Chavez MRN: [id number] Date of Birth: [birth year]  Today's Date: 01/30/2018 OT Group Time: 1101-1201 OT Group Time Calculation (min): 60 min   Skilled Therapeutic Interventions/Progress Updates:    Pt participated in therapeutic w/c level dance group with focus on UE/LE strengthening, activity tolerance, and social participation for carryover during self care tasks. Pt was guided through various dance-based exercises involving UB/LB and trunk. She required [facility] or setup of LE positioning for following structured exercises. Bilateral integration/Rt NMR when pt danced while holding hands with therapist or son (seated on Rt side to promote Rt attention). She often smiled and pumped both hands into the air. Pt mouthing words to a few of her favorite songs, including "My Girl." At end of tx, her son escorted her back to room.   Therapy Documentation Precautions:  Precautions Precautions: Fall Restrictions Weight Bearing Restrictions: No ADL: ADL ADL Comments: Please see functional navigator     See Function Navigator for Current Functional Status.   Therapy/Group: Group Therapy  Jaison Petraglia A Tylin Force 01/30/2018, 12:36 PM

## 2018-01-30 NOTE — Plan of Care (Signed)
  Problem: Consults Goal: RH STROKE PATIENT EDUCATION Description See Patient Education module for education specifics  Outcome: Progressing   Problem: RH BOWEL ELIMINATION Goal: RH STG MANAGE BOWEL WITH ASSISTANCE Description STG Manage Bowel with moderate Assistance.  Outcome: Progressing Goal: RH STG MANAGE BOWEL W/MEDICATION W/ASSISTANCE Description STG Manage Bowel with Medication and min Assistance.  Outcome: Progressing   Problem: RH BLADDER ELIMINATION Goal: RH STG MANAGE BLADDER WITH ASSISTANCE Description STG Manage Bladder With moderate Assistance  Outcome: Progressing Goal: RH STG MANAGE BLADDER WITH EQUIPMENT WITH ASSISTANCE Description STG Manage Bladder With Equipment With moderate Assistance  Outcome: Progressing   Problem: RH SKIN INTEGRITY Goal: RH STG SKIN FREE OF INFECTION/BREAKDOWN Description Skin to remain free from infection or breakdown while on rehab with min assist  Outcome: Progressing Goal: RH STG MAINTAIN SKIN INTEGRITY WITH ASSISTANCE Description STG Maintain Skin Integrity With min Assistance.  Outcome: Progressing Goal: RH STG ABLE TO PERFORM INCISION/WOUND CARE W/ASSISTANCE Description STG Able To Perform MASD Care With moderate Assistance.  Outcome: Progressing   Problem: RH SAFETY Goal: RH STG ADHERE TO SAFETY PRECAUTIONS W/ASSISTANCE/DEVICE Description STG Adhere to Safety Precautions With moderate Assistance and appropriate assistive Device.  Outcome: Progressing Goal: RH STG DECREASED RISK OF FALL WITH ASSISTANCE Description STG Decreased Risk of Fall With moderate Assistance.  Outcome: Progressing   Problem: RH PAIN MANAGEMENT Goal: RH STG PAIN MANAGED AT OR BELOW PT'S PAIN GOAL Description <3 on a 0-10 pain scale  Outcome: Progressing   

## 2018-01-31 ENCOUNTER — Inpatient Hospital Stay (HOSPITAL_COMMUNITY): Payer: Medicare Other

## 2018-01-31 NOTE — Plan of Care (Signed)
  Problem: Consults Goal: [facility] STROKE PATIENT EDUCATION Description See Patient Education module for education specifics  01/31/2018 2028 by Gerald Stabs, LPN Outcome: Progressing 01/31/2018 2024 by Gerald Stabs, LPN Outcome: Progressing   Problem: RH BOWEL ELIMINATION Goal: RH STG MANAGE BOWEL WITH ASSISTANCE Description STG Manage Bowel with moderate Assistance.  01/31/2018 2028 by Gerald Stabs, LPN Outcome: Progressing 01/31/2018 2024 by Gerald Stabs, LPN Outcome: Progressing Goal: RH STG MANAGE BOWEL W/MEDICATION W/ASSISTANCE Description STG Manage Bowel with Medication and min Assistance.  01/31/2018 2028 by Gerald Stabs, LPN Outcome: Progressing 01/31/2018 2024 by Gerald Stabs, LPN Outcome: Progressing   Problem: RH BLADDER ELIMINATION Goal: RH STG MANAGE BLADDER WITH ASSISTANCE Description STG Manage Bladder With moderate Assistance  01/31/2018 2028 by Gerald Stabs, LPN Outcome: Progressing 01/31/2018 2024 by Gerald Stabs, LPN Outcome: Progressing Goal: RH STG MANAGE BLADDER WITH EQUIPMENT WITH ASSISTANCE Description STG Manage Bladder With Equipment With moderate Assistance  01/31/2018 2028 by Gerald Stabs, LPN Outcome: Progressing 01/31/2018 2024 by Gerald Stabs, LPN Outcome: Progressing   Problem: RH SKIN INTEGRITY Goal: RH STG SKIN FREE OF INFECTION/BREAKDOWN Description Skin to remain free from infection or breakdown while on rehab with min assist  01/31/2018 2028 by Gerald Stabs, LPN Outcome: Progressing 01/31/2018 2024 by Gerald Stabs, LPN Outcome: Progressing Goal: RH STG MAINTAIN SKIN INTEGRITY WITH ASSISTANCE Description STG Maintain Skin Integrity With min Assistance.  01/31/2018 2028 by Gerald Stabs, LPN Outcome: Progressing 01/31/2018 2024 by Gerald Stabs, LPN Outcome: Progressing Goal: RH STG ABLE TO PERFORM INCISION/WOUND CARE W/ASSISTANCE Description STG Able To Perform MASD Care With moderate Assistance.   01/31/2018 2028 by Gerald Stabs, LPN Outcome: Progressing 01/31/2018 2024 by Gerald Stabs, LPN Outcome: Progressing   Problem: RH SAFETY Goal: RH STG ADHERE TO SAFETY PRECAUTIONS W/ASSISTANCE/DEVICE Description STG Adhere to Safety Precautions With moderate Assistance and appropriate assistive Device.  01/31/2018 2028 by Gerald Stabs, LPN Outcome: Progressing 01/31/2018 2024 by Gerald Stabs, LPN Outcome: Progressing Goal: RH STG DECREASED RISK OF FALL WITH ASSISTANCE Description STG Decreased Risk of Fall With moderate Assistance.  01/31/2018 2028 by Gerald Stabs, LPN Outcome: Progressing 01/31/2018 2024 by Gerald Stabs, LPN Outcome: Progressing   Problem: RH PAIN MANAGEMENT Goal: RH STG PAIN MANAGED AT OR BELOW PT'S PAIN GOAL Description <3 on a 0-10 pain scale  01/31/2018 2028 by Gerald Stabs, LPN Outcome: Progressing 01/31/2018 2024 by Gerald Stabs, LPN Outcome: Progressing

## 2018-01-31 NOTE — Plan of Care (Signed)
  Problem: Consults Goal: RH STROKE PATIENT EDUCATION Description See Patient Education module for education specifics  Outcome: Progressing   Problem: RH BOWEL ELIMINATION Goal: RH STG MANAGE BOWEL WITH ASSISTANCE Description STG Manage Bowel with moderate Assistance.  Outcome: Progressing Goal: RH STG MANAGE BOWEL W/MEDICATION W/ASSISTANCE Description STG Manage Bowel with Medication and min Assistance.  Outcome: Progressing   Problem: RH BLADDER ELIMINATION Goal: RH STG MANAGE BLADDER WITH ASSISTANCE Description STG Manage Bladder With moderate Assistance  Outcome: Progressing Goal: RH STG MANAGE BLADDER WITH EQUIPMENT WITH ASSISTANCE Description STG Manage Bladder With Equipment With moderate Assistance  Outcome: Progressing   Problem: RH SKIN INTEGRITY Goal: RH STG SKIN FREE OF INFECTION/BREAKDOWN Description Skin to remain free from infection or breakdown while on rehab with min assist  Outcome: Progressing Goal: RH STG MAINTAIN SKIN INTEGRITY WITH ASSISTANCE Description STG Maintain Skin Integrity With min Assistance.  Outcome: Progressing Goal: RH STG ABLE TO PERFORM INCISION/WOUND CARE W/ASSISTANCE Description STG Able To Perform MASD Care With moderate Assistance.  Outcome: Progressing   Problem: RH SAFETY Goal: RH STG ADHERE TO SAFETY PRECAUTIONS W/ASSISTANCE/DEVICE Description STG Adhere to Safety Precautions With moderate Assistance and appropriate assistive Device.  Outcome: Progressing Goal: RH STG DECREASED RISK OF FALL WITH ASSISTANCE Description STG Decreased Risk of Fall With moderate Assistance.  Outcome: Progressing   Problem: RH PAIN MANAGEMENT Goal: RH STG PAIN MANAGED AT OR BELOW PT'S PAIN GOAL Description <3 on a 0-10 pain scale  Outcome: Progressing   

## 2018-01-31 NOTE — Progress Notes (Signed)
Subjective/Complaints:  Discussed purpose of CT as well as general findings with pt and son and daughter  Review of systems unable to obtain secondary to aphasia  Objective: Vital Signs: Blood pressure (!) 161/60, pulse (!) 45, temperature 98.1 F (36.7 C), temperature source Oral, resp. rate 16, height 5\' 3"  (1.6 m), weight 75.4 kg (166 lb 3.6 oz), last menstrual period 11/05/1992, SpO2 98 %. Ct Head Wo Contrast  Result Date: 01/31/2018 CLINICAL DATA:  Follow-up examination for intracranial hemorrhage. EXAM: CT HEAD WITHOUT CONTRAST TECHNIQUE: Contiguous axial images were obtained from the base of the skull through the vertex without intravenous contrast. COMPARISON:  Prior CT from 01/24/2018 and MRI from 01/25/2018 FINDINGS: Brain: Evolving subacute left MCA territory infarct again seen, relatively stable in size and distribution. Hemorrhagic transformation with parenchymal hemorrhage at the level of the left insula measures 2.9 x 1.0 x 3.3 cm. This is likely similar as compared to previous MRI. Decreased regional mass effect as compared to prior. No other obvious new or interval hemorrhage. Stable atrophy with chronic microvascular ischemic disease. Remote left cerebellar infarct again noted. No other new or acute large vessel territory infarct. Persistent mild mass effect on the left lateral ventricle without significant midline shift. No hydrocephalus or ventricular trapping. Basilar cisterns remain patent. No extra-axial fluid collection. Vascular: Calcified atherosclerosis at the skull base. No hyperdense vessel. Skull: Scalp and calvarium demonstrate no acute finding. Sinuses/Orbits: Globes and orbital soft tissues within normal limits. Paranasal sinuses and mastoids remain clear. Other: None. IMPRESSION: 1. Continued interval evolution of subacute left MCA territory infarct with evidence for hemorrhagic transformation. Mildly decreased edema and regional mass effect as compared to previous. 2.  No other new intracranial abnormality. Electronically Signed   By: Jeannine Boga M.D.   On: 01/31/2018 05:33   No results found for this or any previous visit (from the past 72 hour(s)).   HEENT: Right facial abrasion and facial droop Cardio: RRR and No murmur Resp: CTA B/L and Unlabored GI: BS positive and Nontender nondistended Extremity:  No Edema Skin:   Other Abrasion right cheek no drainage Neuro: Alert/Oriented, Abnormal Sensory Withdraws to pinch on right side but otherwise cannot assess, Abnormal Trying to speak more but unable to name simple items, called banana "flower"   Motor 3/5 right deltoid bicep tricep grip hip flexor knee extensor ankle dorsiflexor 5/5 in the left side, Abnormal FMC Ataxic/ dec FMC and Aphasic Musc/Skel:  Other No pain with upper limb or lower limb range of motion General no acute distress   Assessment/Plan: 1. 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 Physiatrist is providing close team supervision and 24 hour management of active medical problems listed below. Physiatrist and rehab team continue to assess barriers to discharge/monitor patient progress toward functional and medical goals. FIM: Function - Bathing Position: Shower Body parts bathed by patient: Right arm, Left arm, Chest, Left upper leg, Right upper leg, Front perineal area, Abdomen Body parts bathed by helper: Right lower leg, Left lower leg, Buttocks Assist Level: Touching or steadying assistance(Pt > 75%)  Function- Upper Body Dressing/Undressing What is the patient wearing?: Pull over shirt/dress Pull over shirt/dress - Perfomed by patient: Put head through opening, Thread/unthread right sleeve, Thread/unthread left sleeve, Pull shirt over trunk Pull over shirt/dress - Perfomed by helper: Pull shirt over trunk, Thread/unthread right  sleeve Assist Level: More than reasonable time, Touching  or steadying assistance(Pt > 75%) Function - Lower Body Dressing/Undressing What is the patient wearing?: Pants Position: Other (comment)(toilet) Pants- Performed by patient: Thread/unthread right pants leg, Thread/unthread left pants leg, Pull pants up/down Pants- Performed by helper: Thread/unthread right pants leg, Thread/unthread left pants leg, Pull pants up/down Non-skid slipper socks- Performed by helper: Don/doff right sock, Don/doff left sock Assist for footwear: Partial/moderate assist Assist for lower body dressing: Touching or steadying assistance (Pt > 75%)  Function - Toileting Toileting steps completed by helper: Adjust clothing prior to toileting, Performs perineal hygiene, Adjust clothing after toileting Toileting Assistive Devices: Grab bar or rail Assist level: Touching or steadying assistance (Pt.75%)  Function - Air cabin crew transfer assistive device: Grab bar Mechanical lift: Stedy Assist level to toilet: Moderate assist (Pt 50 - 74%/lift or lower) Assist level from toilet: Moderate assist (Pt 50 - 74%/lift or lower)  Function - Chair/bed transfer Chair/bed transfer method: Stand pivot Chair/bed transfer assist level: Touching or steadying assistance (Pt > 75%) Chair/bed transfer assistive device: Armrests, Walker Chair/bed transfer details: Manual facilitation for weight shifting, Manual facilitation for placement, Manual facilitation for weight bearing, Verbal cues for precautions/safety, Verbal cues for safe use of DME/AE, Verbal cues for technique, Verbal cues for sequencing  Function - Locomotion: Wheelchair Will patient use wheelchair at discharge?: Yes(TBD) Type: Manual Max wheelchair distance: 25' Assist Level: Total assistance (Pt < 25%) Wheel 50 feet with 2 turns activity did not occur: Safety/medical concerns Wheel 150 feet activity did not occur: Safety/medical concerns Turns  around,maneuvers to table,bed, and toilet,negotiates 3% grade,maneuvers on rugs and over doorsills: No Function - Locomotion: Ambulation Assistive device: Walker-rolling Max distance: 150' Assist level: Touching or steadying assistance (Pt > 75%) Assist level: Touching or steadying assistance (Pt > 75%) Walk 50 feet with 2 turns activity did not occur: Safety/medical concerns Assist level: Touching or steadying assistance (Pt > 75%) Walk 150 feet activity did not occur: Safety/medical concerns Assist level: Touching or steadying assistance (Pt > 75%) Walk 10 feet on uneven surfaces activity did not occur: Safety/medical concerns  Function - Comprehension Comprehension: Auditory Comprehension assist level: Understands basic 75 - 89% of the time/ requires cueing 10 - 24% of the time  Function - Expression Expression: Verbal, Nonverbal Expression assist level: Expresses basic 25 - 49% of the time/requires cueing 50 - 75% of the time. Uses single words/gestures.  Function - Social Interaction Social Interaction assist level: Interacts appropriately 75 - 89% of the time - Needs redirection for appropriate language or to initiate interaction.  Function - Problem Solving Problem solving assist level: Solves basic 50 - 74% of the time/requires cueing 25 - 49% of the time  Function - Memory Memory assist level: Recognizes or recalls less than 25% of the time/requires cueing greater than 75% of the time Patient normally able to recall (first 3 days only): None of the above  Medical Problem List and Plan: 1.Right hemiparesis with expressive aphasiasecondary to left MCA infarction PT OT speech CIR level 2. DVT Prophylaxis/Anticoagulation: SCDs. 3. Pain Management:Hydrocodone as needed 4. Mood:Provide emotional support 5. Neuropsych: This patientiscapable of making decisions on herown behalf. 6. Skin/Wound Care:Routine skin checks 7. 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 Follow-up cardiology services. Chronic amiodarone on hold as patient's cardiac rate controlled. CHRONIC COUMADINcurrently on hold due to high risk of hemorrhage after recent CVA last Neuro note rec for ELiquis 1 wk post CVA if CT head is  neg for hemorrhagic trasnformation, this will be ordered on 6/23.  Since this is valvular A flutter Cardiology not rec Eliquis but resume Warfarin to keep INR >2.0 Repeat CT scan brain hemorrhage unchanged per Radiology, ask neuro to review, would not resume anticoagulation unless oked by neuro This was discussed with the patient's daughter 90.Chronic diastolic congestive heart failure. Monitor for any signs of fluid overload No peripheral edema or SOB 6/23 10.Hypertension. Patient on amiodarone 200 mg daily, HCTZ 25 mg daily prior to admission. Resume as needed Vitals:   01/31/18 0431 01/31/18 0446  BP: (!) 161/60   Pulse: (!) 42 (!) 45  Resp: 16 16  Temp: 98.1 F (36.7 C)   SpO2: 98%   Will allow permissive hypertension for 1 to 2 weeks post stroke. CVA onset 6/16 11.Hyperlipidemia. Lipitor 12.  Hx Afib but amiodarone held per cardiology due to junctional rythmn, asymptomatic LOS (Days) 4 A FACE TO FACE EVALUATION WAS PERFORMED  Charlett Blake 01/31/2018, 10:33 AM

## 2018-01-31 NOTE — Progress Notes (Signed)
Patient continues to have a heart rate in the mid to low 40's. Apical pulse at this time is 45 bpm. She reports no dizziness or other discomfort at this time. Nursing will continue to monitor.

## 2018-01-31 NOTE — Progress Notes (Signed)
Reviewed Ct scan results .hemorrhagic transformation yet unchanged. Continue aspirin for now and repeat head Ct in 1 week and if resiolved may switch to eliquis  Antony Contras, MD

## 2018-02-01 ENCOUNTER — Inpatient Hospital Stay (HOSPITAL_COMMUNITY): Payer: Medicare Other | Admitting: Speech Pathology

## 2018-02-01 ENCOUNTER — Inpatient Hospital Stay (HOSPITAL_COMMUNITY): Payer: Medicare Other | Admitting: Occupational Therapy

## 2018-02-01 ENCOUNTER — Inpatient Hospital Stay (HOSPITAL_COMMUNITY): Payer: Medicare Other

## 2018-02-01 DIAGNOSIS — I5032 Chronic diastolic (congestive) heart failure: Secondary | ICD-10-CM

## 2018-02-01 DIAGNOSIS — D62 Acute posthemorrhagic anemia: Secondary | ICD-10-CM

## 2018-02-01 DIAGNOSIS — E46 Unspecified protein-calorie malnutrition: Secondary | ICD-10-CM

## 2018-02-01 DIAGNOSIS — N183 Chronic kidney disease, stage 3 unspecified: Secondary | ICD-10-CM

## 2018-02-01 DIAGNOSIS — I10 Essential (primary) hypertension: Secondary | ICD-10-CM

## 2018-02-01 DIAGNOSIS — R4701 Aphasia: Secondary | ICD-10-CM

## 2018-02-01 DIAGNOSIS — E8809 Other disorders of plasma-protein metabolism, not elsewhere classified: Secondary | ICD-10-CM

## 2018-02-01 DIAGNOSIS — Z8679 Personal history of other diseases of the circulatory system: Secondary | ICD-10-CM

## 2018-02-01 MED ORDER — PRO-STAT SUGAR FREE PO LIQD
30.0000 mL | Freq: Two times a day (BID) | ORAL | Status: DC
  Administered 2018-02-01 – 2018-02-13 (×25): 30 mL via ORAL
  Filled 2018-02-01 (×25): qty 30

## 2018-02-01 NOTE — Progress Notes (Signed)
Physical Therapy Session Note  Patient Details  Name: Brooke Chavez MRN: [id number] Date of Birth: [birth year]  Today's Date: 02/01/2018 PT Individual Time: 1500-1610 PT Individual Time Calculation (min): 70 min   Short Term Goals: Week 1:  PT Short Term Goal 1 (Week 1): Pt will transfer bed<>chair w/ mod assist towards R (hemi) side PT Short Term Goal 2 (Week 1): Pt will ambulate 25' w/ min assist w/ LRAD PT Short Term Goal 3 (Week 1): Pt will attend to R environment during functional mobility w/ min cues 50% of the time PT Short Term Goal 4 (Week 1): Pt will self-propel w/c 25' w/ min assist  Skilled Therapeutic Interventions/Progress Updates:    Pt seated in w/c upon PT arrival, agreeable to therapy tx and denies pain. Pt transported to the gym in w/c total assist for time management. Pt ambulated 2 x 80 ft this session with RW and min assist, verbal cues for increased R step length and foot clearance, tactile cues for upright posture. Pt worked on dynamic standing balance without UE support standing on airex and standing on airex while performing card matching activity. Pt worked on dynamic standing balance and cognition while completing peg board puzzle, min assist for cues to complete simple red/blue pattern, min-mod verbal cues to complete more complex pattern. Pt performed x 10 sit<>stands from edge of mat with min assist for LE strengthening. Pt ambulated from gym>rehab apartment with RW and min assist, verbal cues for safety awareness, upright posture, R step length and R knee extension during stance. Pt ambulated throughout apartment working on household ambulation, navigating furniture, transferring on/off furniture, and opening/closing appliances/cabinets in the kitchen, min assist. Pt transported back to room at end of session, left seated in w/c in care of daughter with needs in reach.   Therapy Documentation Precautions:  Precautions Precautions: Fall Restrictions Weight Bearing  Restrictions: No   See Function Navigator for Current Functional Status.   Therapy/Group: Individual Therapy  Netta Corrigan, PT, DPT 02/01/2018, 12:57 PM

## 2018-02-01 NOTE — Progress Notes (Signed)
Speech Language Pathology Daily Session Note  Patient Details  Name: Brooke Chavez MRN: [id number] Date of Birth: [birth year]  Today's Date: 02/01/2018 SLP Individual Time: 1300-1345 SLP Individual Time Calculation (min): 45 min  Short Term Goals: Week 1: SLP Short Term Goal 1 (Week 1): Pt will match picture to object in field of 2 objects with ~ 90% accuracy and Mod A cues.  SLP Short Term Goal 2 (Week 1): Pt will answer simple yes/no questions with ~ 75% accuracy and Max A cues.  SLP Short Term Goal 3 (Week 1): Pt will follow 1 step basic directions related to self in 8 out of 10 opportunities with Max A cues.  SLP Short Term Goal 4 (Week 1): Pt will imitate consonant vowel combinations with 25% accuracy and Max A cues.  SLP Short Term Goal 5 (Week 1): Pt will consume regular textures with Min A cues for use of compensatory swallow strategies for complete oral clearing to demonstrate readiness for diet upgrade.   Skilled Therapeutic Interventions: Skilled treatment session focused on communication goals. SLP facilitated session by allowing pt to match picture to object for 15 objects with 100% accuracy independently. Pt also selected requested object in field of 2 with 100% accuracy for 15 objects. With Max A cues, pt able to produce rote counting to 10 and days of the week with ~ 75% intelligibility. During rote activities, pt with perseverative motor patterns indicated of apraxia. Pt able to imitate /b? x1 but then became emotional d/t frustration. Pt was returned to room, and support given. Left upright in wheelchair with daughter present. Continue per current plan of care.      Function:    Cognition Comprehension Comprehension assist level: Understands basic 75 - 89% of the time/ requires cueing 10 - 24% of the time  Expression   Expression assist level: Expresses basis less than 25% of the time/requires cueing >75% of the time.  Social Interaction Social Interaction assist level:  Interacts appropriately with others with medication or extra time (anti-anxiety, antidepressant).  Problem Solving Problem solving assist level: Solves basic 50 - 74% of the time/requires cueing 25 - 49% of the time;Solves basic 75 - 89% of the time/requires cueing 10 - 24% of the time  Memory Memory assist level: Recognizes or recalls less than 25% of the time/requires cueing greater than 75% of the time    Pain    Therapy/Group: Individual Therapy  Jehu Mccauslin 02/01/2018, 1:52 PM

## 2018-02-01 NOTE — Plan of Care (Signed)
  Problem: Consults Goal: RH STROKE PATIENT EDUCATION Description See Patient Education module for education specifics  Outcome: Progressing   Problem: RH BOWEL ELIMINATION Goal: RH STG MANAGE BOWEL WITH ASSISTANCE Description STG Manage Bowel with moderate Assistance.  Outcome: Progressing Goal: RH STG MANAGE BOWEL W/MEDICATION W/ASSISTANCE Description STG Manage Bowel with Medication and min Assistance.  Outcome: Progressing   Problem: RH BLADDER ELIMINATION Goal: RH STG MANAGE BLADDER WITH ASSISTANCE Description STG Manage Bladder With moderate Assistance  Outcome: Progressing Goal: RH STG MANAGE BLADDER WITH EQUIPMENT WITH ASSISTANCE Description STG Manage Bladder With Equipment With moderate Assistance  Outcome: Progressing   Problem: RH SKIN INTEGRITY Goal: RH STG SKIN FREE OF INFECTION/BREAKDOWN Description Skin to remain free from infection or breakdown while on rehab with min assist  Outcome: Progressing Goal: RH STG MAINTAIN SKIN INTEGRITY WITH ASSISTANCE Description STG Maintain Skin Integrity With min Assistance.  Outcome: Progressing Goal: RH STG ABLE TO PERFORM INCISION/WOUND CARE W/ASSISTANCE Description STG Able To Perform MASD Care With moderate Assistance.  Outcome: Progressing   Problem: RH SAFETY Goal: RH STG ADHERE TO SAFETY PRECAUTIONS W/ASSISTANCE/DEVICE Description STG Adhere to Safety Precautions With moderate Assistance and appropriate assistive Device.  Outcome: Progressing Goal: RH STG DECREASED RISK OF FALL WITH ASSISTANCE Description STG Decreased Risk of Fall With moderate Assistance.  Outcome: Progressing   Problem: RH PAIN MANAGEMENT Goal: RH STG PAIN MANAGED AT OR BELOW PT'S PAIN GOAL Description <3 on a 0-10 pain scale  Outcome: Progressing

## 2018-02-01 NOTE — Progress Notes (Signed)
Occupational Therapy Session Note  Patient Details  Name: Brooke Chavez MRN: [id number] Date of Birth: 30-Apr-1941  Today's Date: 02/01/2018  Session 1 OT Individual Time: 0805-0900 OT Individual Time Calculation (min): 55 min   Session 2 OT Individual Time: 1415-1445 OT Individual Time Calculation (min): 30 min    Short Term Goals: Week 1:  OT Short Term Goal 1 (Week 1): Pt will look to the R to locate 1/4 grooming items with mod questioning cues. OT Short Term Goal 2 (Week 1): Pt will complete UB dressing using hemi techniques with mod questioning cues OT Short Term Goal 3 (Week 1): Pt will complete toilet transfer with Mod A consistent Mod A OT Short Term Goal 4 (Week 1): Pt will integrate R UE into bathing taks with no more than mod questioning cues  Skilled Therapeutic Interventions/Progress Updates:  Session 1   Pt greeted semi-reclined in bed eating breakfast. Noted pocketing in R cheek requiring verbal cues to clear and swallow. Pt came to sitting EOB with increased time and supervision. Stand-pivot to wc on R side with min A. Pt declined needing to go to the bathroom, and completed stand-pivot into shower with min A. Bathing completed with pt needing 50% verbal cues to integrate R UE 2/2 R inattention. Min A to transfer out of shower. Worked on dressing strategies from wc at the sink with multimodal cues for hemi techniques to dress R side first. Multiple sit<>stands with min A and verbal cues to initiate stand. Fine motor coordination with shoe tying task with pt able to complete with increased time and multiple trials. Pt left seated in wc at end of session with safety belt on and daughter present.   Session 2 Pt brought to dynavision room via wc for time management. Dyanvision activity focus on R attention, visual scanning, and functional use of R UE. Pt completed activity sitting down initially with min cues to locate lights on R side of board. She also needed min guided A to hit  buttons with R hand . Progressed to activity in standing with OT providing min/CGA for balance. Pt with increased smoothness and accuracy with use of R UE as well as improved reaction time with R upper and lower quadrants. Pt returned to room at end of session and left seated in wc with safety belt on and daughter present.   Therapy Documentation Precautions:  Precautions Precautions: Fall Restrictions Weight Bearing Restrictions: No Pain:  none/denies pain ADL: ADL ADL Comments: Please see functional navigator  See Function Navigator for Current Functional Status.  Therapy/Group: Individual Therapy  Valma Cava 02/01/2018, 2:52 PM

## 2018-02-01 NOTE — Plan of Care (Signed)
Pt denies any pain. Alert and active with family and friends in the room. Observed eating soft food meal brought outside of hospital. Tolerated swallowing medication whole as requested but started coughing after swallowing. Applesauce was recommended but pt declined. Daughter was present.

## 2018-02-01 NOTE — Progress Notes (Signed)
Subjective/Complaints: Patient seen lying in bed this morning. Daughter at bedside and son on speaker phone. They have questions regarding CT scan and anticoagulation. No reported issues overnight  Review of systems unable to obtain secondary to aphasia  Objective: Vital Signs: Blood pressure 140/72, pulse 70, temperature 99 F (37.2 C), temperature source Oral, resp. rate 18, height 5\' 3"  (1.6 m), weight 75.3 kg (166 lb 0.1 oz), last menstrual period 11/05/1992, SpO2 100 %. Ct Head Wo Contrast  Result Date: 01/31/2018 CLINICAL DATA:  Follow-up examination for intracranial hemorrhage. EXAM: CT HEAD WITHOUT CONTRAST TECHNIQUE: Contiguous axial images were obtained from the base of the skull through the vertex without intravenous contrast. COMPARISON:  Prior CT from 01/24/2018 and MRI from 01/25/2018 FINDINGS: Brain: Evolving subacute left MCA territory infarct again seen, relatively stable in size and distribution. Hemorrhagic transformation with parenchymal hemorrhage at the level of the left insula measures 2.9 x 1.0 x 3.3 cm. This is likely similar as compared to previous MRI. Decreased regional mass effect as compared to prior. No other obvious new or interval hemorrhage. Stable atrophy with chronic microvascular ischemic disease. Remote left cerebellar infarct again noted. No other new or acute large vessel territory infarct. Persistent mild mass effect on the left lateral ventricle without significant midline shift. No hydrocephalus or ventricular trapping. Basilar cisterns remain patent. No extra-axial fluid collection. Vascular: Calcified atherosclerosis at the skull base. No hyperdense vessel. Skull: Scalp and calvarium demonstrate no acute finding. Sinuses/Orbits: Globes and orbital soft tissues within normal limits. Paranasal sinuses and mastoids remain clear. Other: None. IMPRESSION: 1. Continued interval evolution of subacute left MCA territory infarct with evidence for hemorrhagic  transformation. Mildly decreased edema and regional mass effect as compared to previous. 2. No other new intracranial abnormality. Electronically Signed   By: Jeannine Boga M.D.   On: 01/31/2018 05:33   No results found for this or any previous visit (from the past 72 hour(s)).   Constitutional: No distress . Vital signs reviewed. HENT: Normocephalic.  Atraumatic. Eyes: EOMI. No discharge. Cardiovascular: RRR. No JVD. Respiratory: CTA Bilaterally. Normal effort. GI: BS +. Non-distended. Musc: No edema or tenderness in extremities. Neuro: Alert Global aphasia Limited ability to follow commands,? 4/5 throughout No withdrawal to painful stimuli  Assessment/Plan: 1. 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 Physiatrist is providing close team supervision and 24 hour management of active medical problems listed below. Physiatrist and rehab team continue to assess barriers to discharge/monitor patient progress toward functional and medical goals. FIM: Function - Bathing Position: Shower Body parts bathed by patient: Right arm, Left arm, Chest, Left upper leg, Right upper leg, Front perineal area, Abdomen, Buttocks, Right lower leg, Left lower leg Body parts bathed by helper: Right lower leg, Left lower leg, Buttocks Assist Level: Touching or steadying assistance(Pt > 75%)  Function- Upper Body Dressing/Undressing What is the patient wearing?: Pull over shirt/dress Pull over shirt/dress - Perfomed by patient: Thread/unthread right sleeve, Thread/unthread left sleeve, Put head through opening, Pull shirt over trunk Pull over shirt/dress - Perfomed by helper: Pull shirt over trunk, Thread/unthread right sleeve Assist Level: Set up, More than reasonable time Function - Lower Body Dressing/Undressing What is the patient wearing?: Pants, Socks,  Shoes Position: Wheelchair/chair at sink Pants- Performed by patient: Thread/unthread left pants leg, Pull pants up/down Pants- Performed by helper: Thread/unthread right pants leg Non-skid slipper socks- Performed by patient:  Don/doff left sock Non-skid slipper socks- Performed by helper: Don/doff right sock, Don/doff left sock Socks - Performed by patient: Don/doff left sock Socks - Performed by helper: Don/doff right sock Shoes - Performed by patient: Don/doff left shoe, Fasten right, Fasten left Shoes - Performed by helper: Don/doff right shoe Assist for footwear: Partial/moderate assist Assist for lower body dressing: Touching or steadying assistance (Pt > 75%)  Function - Toileting Toileting steps completed by helper: Adjust clothing prior to toileting, Performs perineal hygiene, Adjust clothing after toileting Toileting Assistive Devices: Grab bar or rail Assist level: Touching or steadying assistance (Pt.75%)  Function - Air cabin crew transfer assistive device: Grab bar Mechanical lift: Stedy Assist level to toilet: Touching or steadying assistance (Pt > 75%) Assist level from toilet: Touching or steadying assistance (Pt > 75%)  Function - Chair/bed transfer Chair/bed transfer method: Stand pivot Chair/bed transfer assist level: Touching or steadying assistance (Pt > 75%) Chair/bed transfer assistive device: Armrests, Walker Chair/bed transfer details: Manual facilitation for weight shifting, Manual facilitation for placement, Manual facilitation for weight bearing, Verbal cues for precautions/safety, Verbal cues for safe use of DME/AE, Verbal cues for technique, Verbal cues for sequencing  Function - Locomotion: Wheelchair Will patient use wheelchair at discharge?: Yes(TBD) Type: Manual Max wheelchair distance: 25' Assist Level: Total assistance (Pt < 25%) Wheel 50 feet with 2 turns activity did not occur: Safety/medical concerns Wheel 150 feet activity did not  occur: Safety/medical concerns Turns around,maneuvers to table,bed, and toilet,negotiates 3% grade,maneuvers on rugs and over doorsills: No Function - Locomotion: Ambulation Assistive device: Walker-rolling Max distance: 150' Assist level: Touching or steadying assistance (Pt > 75%) Assist level: Touching or steadying assistance (Pt > 75%) Walk 50 feet with 2 turns activity did not occur: Safety/medical concerns Assist level: Touching or steadying assistance (Pt > 75%) Walk 150 feet activity did not occur: Safety/medical concerns Assist level: Touching or steadying assistance (Pt > 75%) Walk 10 feet on uneven surfaces activity did not occur: Safety/medical concerns  Function - Comprehension Comprehension: Auditory Comprehension assist level: Understands basic 75 - 89% of the time/ requires cueing 10 - 24% of the time  Function - Expression Expression: Verbal Expression assist level: Expresses basic 50 - 74% of the time/requires cueing 25 - 49% of the time. Needs to repeat parts of sentences.  Function - Social Interaction Social Interaction assist level: Interacts appropriately with others with medication or extra time (anti-anxiety, antidepressant).  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 DVT Prophylaxis/Anticoagulation: SCDs.   Head CT on 6/23 reviewed, showing persistent hemorrhagic transformation. Cont Aspirin, plan for repeat head CT~ 6/30 and it hemorrhage resolved may switch to coumadin per Neuro -discussed and  educated family 3. Pain Management:Hydrocodone as needed 4. Mood:Provide emotional support 5. Neuropsych: This patientis notcapable of making decisions on herown behalf. 6. Skin/Wound Care:Routine skin checks 7. 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 Follow-up cardiology services. Chronic amiodarone on hold as patient's cardiac rate controlled. CHRONIC COUMADINcurrently on hold due to high risk of  hemorrhage after recent CVA last Neuro note rec for ELiquis 1 wk post CVA if CT head is neg for hemorrhagic trasnformation,Warfarin to keep INR >2.0 This was discussed with the patient's daughter 55.Chronic diastolic congestive heart failure. Monitor for any signs of fluid overload Filed Weights   01/28/18 0437 01/29/18 0448 02/01/18 0445  Weight: 75.3 kg (166 lb 1.7 oz) 75.4 kg (166 lb 3.6 oz) 75.3 kg (166 lb 0.1 oz)   Stable on 6/24 10.Hypertension. Patient on amiodarone 200 mg daily, HCTZ 25 mg daily prior to admission. Resume as needed Vitals:   01/31/18 1947 02/01/18 0445  BP: (!) 155/58 140/72  Pulse: 60 70  Resp: 18 18  Temp: 98 F (36.7 C) 99 F (37.2 C)  SpO2: 99% 100%   ? Improving on 6/24 11.Hyperlipidemia. Lipitor 12.  Hx Afib but amiodarone held per cardiology due to junctional rythmn, asymptomatic 13. CKD  Creatinine 1.22 on 6/20  Cont to monitor 14. 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 Acute blood loss anemia  Hemoglobin 9.4 on 6/20  Continue to monitor  LOS (Days) 5 A FACE TO FACE EVALUATION WAS PERFORMED  Ruta Capece Lorie Phenix 02/01/2018, 12:03 PM

## 2018-02-02 ENCOUNTER — Inpatient Hospital Stay (HOSPITAL_COMMUNITY): Payer: Medicare Other | Admitting: Speech Pathology

## 2018-02-02 ENCOUNTER — Inpatient Hospital Stay (HOSPITAL_COMMUNITY): Payer: Medicare Other | Admitting: Occupational Therapy

## 2018-02-02 ENCOUNTER — Inpatient Hospital Stay (HOSPITAL_COMMUNITY): Payer: Medicare Other

## 2018-02-02 ENCOUNTER — Inpatient Hospital Stay (HOSPITAL_COMMUNITY): Payer: Medicare Other | Admitting: Physical Therapy

## 2018-02-02 DIAGNOSIS — I10 Essential (primary) hypertension: Secondary | ICD-10-CM

## 2018-02-02 LAB — URINALYSIS, ROUTINE W REFLEX MICROSCOPIC
Bilirubin Urine: NEGATIVE
Glucose, UA: NEGATIVE mg/dL
Ketones, ur: NEGATIVE mg/dL
Nitrite: NEGATIVE
Protein, ur: 30 mg/dL — AB
RBC / HPF: >50 RBC/hpf — ABNORMAL HIGH (ref 0–5)
Specific Gravity, Urine: 1.015 (ref 1.005–1.030)
WBC, UA: >50 WBC/hpf — ABNORMAL HIGH (ref 0–5)
pH: 5 (ref 5.0–8.0)

## 2018-02-02 MED ORDER — CHLORTHALIDONE 25 MG PO TABS
25.0000 mg | ORAL_TABLET | Freq: Every day | ORAL | Status: DC
  Administered 2018-02-02 – 2018-02-04 (×3): 25 mg via ORAL
  Filled 2018-02-02 (×3): qty 1

## 2018-02-02 NOTE — Progress Notes (Signed)
Called to room by therapist due to patient having blood in brief. In the middle of this nurse assessing source of bleeding, patient began voiding and multiple blood clots noted in urine. Patient not showing any physical indicators of pain and unable to get a consistent response to pain questions. PA Silvestre Mesi PA notified.  New order received. Continue to monitor.

## 2018-02-02 NOTE — Progress Notes (Signed)
Physical Therapy Session Note  Patient Details  Name: Brooke Chavez MRN: [id number] Date of Birth: [birth year]  Today's Date: 02/02/2018 PT Individual Time: 0900-0955 PT Individual Time Calculation (min): 55 min   Short Term Goals: Week 1:  PT Short Term Goal 1 (Week 1): Pt will transfer bed<>chair w/ mod assist towards R (hemi) side PT Short Term Goal 2 (Week 1): Pt will ambulate 25' w/ min assist w/ LRAD PT Short Term Goal 3 (Week 1): Pt will attend to R environment during functional mobility w/ min cues 50% of the time PT Short Term Goal 4 (Week 1): Pt will self-propel w/c 25' w/ min assist  Skilled Therapeutic Interventions/Progress Updates:   Pt in supine and agreeable to therapy, denies pain and motioning to put clothes on. Provided min assist to don UE and LE garments w/ verbal and visual cues for technique and sequencing. Min verbal and visual cues for sequencing of brushing dentures and putting them in. Pt noted to have blood in brief when changed, denied and pain w/ urination but confirmed some broad pelvic pain. Unable to describe further 2/2 aphasia. Made RN aware and RN present at end of session assessing pt in supine. Prior to end of session, worked on dynamic standing balance and posture while in gym w/ min guard to close supervision in 30-60 sec bouts. Pt limited in standing by RLE muscle fatigue causing increased knee flexion and LOB to R side. Mod verbal and tactile cues for muscle engagement on R side. Balance tasks emphasized thoracic extension to reach up and trunk rotation. Returned to room and ended session in supine, in care of RN and all needs met.   Therapy Documentation Precautions:  Precautions Precautions: Fall Restrictions Weight Bearing Restrictions: No  See Function Navigator for Current Functional Status.   Therapy/Group: Individual Therapy  Brooke Chavez 02/02/2018, 9:57 AM

## 2018-02-02 NOTE — Progress Notes (Signed)
Occupational Therapy Session Note  Patient Details  Name: Brooke Chavez MRN: [id number] Date of Birth: June 05, 1941  Today's Date: 02/02/2018 OT Individual Time: 1105-1200 OT Individual Time Calculation (min): 55 min   Short Term Goals: Week 1:  OT Short Term Goal 1 (Week 1): Pt will look to the R to locate 1/4 grooming items with mod questioning cues. OT Short Term Goal 2 (Week 1): Pt will complete UB dressing using hemi techniques with mod questioning cues OT Short Term Goal 3 (Week 1): Pt will complete toilet transfer with Mod A consistent Mod A OT Short Term Goal 4 (Week 1): Pt will integrate R UE into bathing taks with no more than mod questioning cues  Skilled Therapeutic Interventions/Progress Updates:    Pt greeted semi-reclined in bed and agreeable to OT treatment session. Pt came to sitting EOB with supervision, then was able to thread B LEs into pants with verbal cues for hemi techniques. Min A sit<>stand with CGA for balance when pulling up pants. Socks and shoes donned at EOB with increased time and 2 trials to tie shoes 2/2 R hand coordination. Stand-pivot to wc w/ min A. Pt brought to therapy gym and completed similar transfer to therapy mat. Worked on hip, ankle balance strategies while standing on foam platform. Incorporated card matching activity using only R UE for neuro re-ed. Pt unable to pick out card from verbal request, but could match them visually. Pt needed mod multimodal cues to use only R UE. Overall min/CGA for balance. Integrate trunk rotation and hip hike (in prep for toileting tasks) with pt reaching under R buttocks to retrieve cards. Pt  Returned to room at end of session and left seated in wc with safety belt on and needs met.   Therapy Documentation Precautions:  Precautions Precautions: Fall Restrictions Weight Bearing Restrictions: No Pain:  none/denies pain ADL: ADL ADL Comments: Please see functional navigator  See Function Navigator for Current  Functional Status.  Therapy/Group: Individual Therapy  Valma Cava 02/02/2018, 12:15 PM

## 2018-02-02 NOTE — Progress Notes (Signed)
Physical Therapy Session Note  Patient Details  Name: Brooke Chavez MRN: [id number] Date of Birth: April 14, 1941  Today's Date: 02/02/2018 PT Individual Time: 1615-1700 PT Individual Time Calculation (min): 45 min   Short Term Goals: Week 1:  PT Short Term Goal 1 (Week 1): Pt will transfer bed<>chair w/ mod assist towards R (hemi) side PT Short Term Goal 2 (Week 1): Pt will ambulate 25' w/ min assist w/ LRAD PT Short Term Goal 3 (Week 1): Pt will attend to R environment during functional mobility w/ min cues 50% of the time PT Short Term Goal 4 (Week 1): Pt will self-propel w/c 25' w/ min assist  Skilled Therapeutic Interventions/Progress Updates:    Pt supine in bed upon PT arrival, agreeable to therapy tx and denies pain. Pt transferred from supine>sitting EOB with min assist. Pt transferred to sit<>stand with supervision, verbal cues for hand placement with RW. Pt ambulated to the gym x 150 ft with min assist and RW, verbal cues for R LE step length and foot clearance. Pt worked on R LE [employer] kicking yoga block while ambulating for increased hip flexion and step length, x 2 trials with RW and min assist. Pt worked on Associate Professor back and forth in place with R LE for neuro re-ed and hip flexor strengthening. Pt ambulated back to room and left seated in w/c with needs in reach and family present.   Therapy Documentation Precautions:  Precautions Precautions: Fall Restrictions Weight Bearing Restrictions: No   See Function Navigator for Current Functional Status.   Therapy/Group: Individual Therapy  Netta Corrigan, PT, DPT 02/02/2018, 12:53 PM

## 2018-02-02 NOTE — Progress Notes (Signed)
Subjective/Complaints: Patient seen lying in bed this morning. Sr. Bedside. No reported issues overnight. Sister's questions regarding anticoagulation.  Review of systems: unable to obtain secondary to aphasia  Objective: Vital Signs: Blood pressure (!) 156/71, pulse 74, temperature 98.8 F (37.1 C), temperature source Oral, resp. rate 18, height 5\' 3"  (1.6 m), weight 73.4 kg (161 lb 13.1 oz), last menstrual period 11/05/1992, SpO2 97 %. No results found. No results found for this or any previous visit (from the past 72 hour(s)).   Constitutional: No distress . Vital signs reviewed. HENT: Normocephalic.  Atraumatic. Eyes: EOMI. No discharge. Cardiovascular: RRR. No JVD. Respiratory: CTA Bilaterally.  Normal effort. GI: BS +. Non-distended. Musc: No edema or tenderness in extremities. Neuro: Alert Global aphasia Limited ability to follow commands, ?4/5 throughout Psych: Unable to assess due to aphasia  Assessment/Plan: 1. 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 Physiatrist is providing close team supervision and 24 hour management of active medical problems listed below. Physiatrist and rehab team continue to assess barriers to discharge/monitor patient progress toward functional and medical goals. FIM: Function - Bathing Position: Shower Body parts bathed by patient: Right arm, Left arm, Chest, Left upper leg, Right upper leg, Front perineal area, Abdomen, Buttocks, Right lower leg, Left lower leg Body parts bathed by helper: Right lower leg, Left lower leg, Buttocks Assist Level: Touching or steadying assistance(Pt > 75%)  Function- Upper Body Dressing/Undressing What is the patient wearing?: Pull over shirt/dress Pull over shirt/dress - Perfomed by patient: Thread/unthread right sleeve, Thread/unthread left sleeve, Put head through  opening, Pull shirt over trunk Pull over shirt/dress - Perfomed by helper: Pull shirt over trunk, Thread/unthread right sleeve Assist Level: Set up, More than reasonable time Function - Lower Body Dressing/Undressing What is the patient wearing?: Pants, Socks, Shoes Position: Wheelchair/chair at sink Pants- Performed by patient: Thread/unthread left pants leg, Pull pants up/down Pants- Performed by helper: Thread/unthread right pants leg Non-skid slipper socks- Performed by patient: Don/doff left sock Non-skid slipper socks- Performed by helper: Don/doff right sock, Don/doff left sock Socks - Performed by patient: Don/doff left sock Socks - Performed by helper: Don/doff right sock Shoes - Performed by patient: Don/doff left shoe, Fasten right, Fasten left Shoes - Performed by helper: Don/doff right shoe Assist for footwear: Partial/moderate assist Assist for lower body dressing: Touching or steadying assistance (Pt > 75%)  Function - Toileting Toileting steps completed by helper: Adjust clothing prior to toileting, Performs perineal hygiene, Adjust clothing after toileting Toileting Assistive Devices: Grab bar or rail Assist level: Touching or steadying assistance (Pt.75%)  Function - Air cabin crew transfer activity did not occur: Safety/medical concerns Toilet transfer assistive device: Grab bar, Environmental manager lift: Stedy Assist level to toilet: Touching or steadying assistance (Pt > 75%) Assist level from toilet: Touching or steadying assistance (Pt > 75%)  Function - Chair/bed transfer Chair/bed transfer method: Stand pivot, Ambulatory Chair/bed transfer assist level: Touching or steadying assistance (Pt > 75%) Chair/bed transfer assistive device: Armrests Chair/bed transfer details: Verbal cues for technique, Verbal cues for precautions/safety, Manual facilitation for weight shifting  Function - Locomotion: Wheelchair Will patient use wheelchair at discharge?:  Yes(TBD) Type: Manual Max wheelchair distance: 25' Assist Level: Total assistance (Pt < 25%) Wheel 50 feet with 2 turns activity did not occur: Safety/medical concerns Wheel 150 feet activity did not occur: Safety/medical concerns Turns around,maneuvers to table,bed, and toilet,negotiates  3% grade,maneuvers on rugs and over doorsills: No Function - Locomotion: Ambulation Assistive device: Walker-rolling Max distance: 80 ft Assist level: Touching or steadying assistance (Pt > 75%) Assist level: Touching or steadying assistance (Pt > 75%) Walk 50 feet with 2 turns activity did not occur: Safety/medical concerns Assist level: Touching or steadying assistance (Pt > 75%) Walk 150 feet activity did not occur: Safety/medical concerns Assist level: Touching or steadying assistance (Pt > 75%) Walk 10 feet on uneven surfaces activity did not occur: Safety/medical concerns  Function - Comprehension Comprehension: Auditory Comprehension assist level: Understands basic 75 - 89% of the time/ requires cueing 10 - 24% of the time  Function - Expression Expression: Verbal Expression assist level: Expresses basis less than 25% of the time/requires cueing >75% of the time.  Function - Social Interaction Social Interaction assist level: Interacts appropriately with others with medication or extra time (anti-anxiety, antidepressant).  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 DVT Prophylaxis/Anticoagulation: SCDs.    Head CT on 6/23 reviewed, showing persistent hemorrhagic transformation. Cont Aspirin, plan for repeat head CT~ 6/30 and it hemorrhage resolved may switch to coumadin per Neuro -discussed and educated family, educated sister again on 6/25 3. Pain Management:Hydrocodone as needed 4. Mood:Provide emotional support 5. Neuropsych: This patientis notcapable of making decisions on herown behalf. 6. Skin/Wound Care:Routine skin checks 7. 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 Follow-up cardiology services. Chronic amiodarone on hold as patient's cardiac rate controlled. CHRONIC COUMADINcurrently on hold due to high risk of hemorrhage after recent CVA last Neuro note rec for ELiquis 1 wk post CVA if CT head is neg for hemorrhagic trasnformation,Warfarin to keep INR >2.0 This was discussed with the patient's daughter 42.Chronic diastolic congestive heart failure. Monitor for any signs of fluid overload Filed Weights   01/29/18 0448 02/01/18 0445 02/02/18 0646  Weight: 75.4 kg (166 lb 3.6 oz) 75.3 kg (166 lb 0.1 oz) 73.4 kg (161 lb 13.1 oz)   Stable on 6/25 10.Hypertension. Patient on amiodarone 200 mg daily, HCTZ 25 mg daily prior to admission. Resume as needed Vitals:   02/01/18 2136 02/02/18 0342  BP: (!) 149/52 (!) 156/71  Pulse: (!) 58 74  Resp: 18 18  Temp: 97.9 F (36.6 C) 98.8 F (37.1 C)  SpO2: 100% 97%   Chlorthalidone 25 started on 6/25 11.Hyperlipidemia. Lipitor 12.  Hx Afib but amiodarone held per cardiology due to junctional rythmn, asymptomatic 13. CKD  Creatinine 1.22 on 6/20  Cont to monitor 14. 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 Acute blood loss anemia  Hemoglobin 9.4 on 6/20  Labs ordered for tomorrow  Continue to monitor  LOS (Days) 6 A FACE TO FACE EVALUATION WAS PERFORMED  Roslynn Holte Lorie Phenix 02/02/2018, 9:25 AM

## 2018-02-02 NOTE — Progress Notes (Signed)
Speech Language Pathology Daily Session Note  Patient Details  Name: Brooke Chavez MRN: [id number] Date of Birth: 1941/08/05  Today's Date: 02/02/2018 SLP Individual Time: 1350-1430 SLP Individual Time Calculation (min): 40 min  Short Term Goals: Week 1: SLP Short Term Goal 1 (Week 1): Pt will match picture to object in field of 2 objects with ~ 90% accuracy and Mod A cues.  SLP Short Term Goal 2 (Week 1): Pt will answer simple yes/no questions with ~ 75% accuracy and Max A cues.  SLP Short Term Goal 3 (Week 1): Pt will follow 1 step basic directions related to self in 8 out of 10 opportunities with Max A cues.  SLP Short Term Goal 4 (Week 1): Pt will imitate consonant vowel combinations with 25% accuracy and Max A cues.  SLP Short Term Goal 5 (Week 1): Pt will consume regular textures with Min A cues for use of compensatory swallow strategies for complete oral clearing to demonstrate readiness for diet upgrade.   Skilled Therapeutic Interventions:  Pt was seen for skilled ST targeting communication goals.  Pt was able to produce vowel sounds /ay/ and /I/ in isolation with mod-max assist multimodal cues to recognize and correct motor planning errors.  Pt had difficulty carrying over production of vowels across multiple repetitions; however, she could add /m/ and /n/ in initial and final positions to vowel sounds in order to produce [place], mine, and me/ with mod-max assist multimodal cues.  Pt's verbal expression remains profoundly limited by groping behaviors, perseveration, as well as inconsistent and irregular production of phonemes.  Today she was mostly aware of her errors but had errors that she was unaware of.  Pt was returned to room and left in wheelchair with family members at bedside and quick release belt donned.  Continue per current plan of care.   Function:  Eating Eating   Modified Consistency Diet: Yes Eating Assist Level: More than reasonable amount of time;Set up assist for            Cognition Comprehension Comprehension assist level: Understands basic 50 - 74% of the time/ requires cueing 25 - 49% of the time  Expression   Expression assist level: Expresses basic 25 - 49% of the time/requires cueing 50 - 75% of the time. Uses single words/gestures.  Social Interaction Social Interaction assist level: Interacts appropriately 90% of the time - Needs monitoring or encouragement for participation or interaction.  Problem Solving Problem solving assist level: Solves basic 50 - 74% of the time/requires cueing 25 - 49% of the time  Memory Memory assist level: Recognizes or recalls 25 - 49% of the time/requires cueing 50 - 75% of the time    Pain Pain Assessment Pain Scale: 0-10 Pain Score: 0-No pain  Therapy/Group: Individual Therapy  Miloh Alcocer, Selinda Orion 02/02/2018, 4:11 PM

## 2018-02-02 NOTE — Progress Notes (Signed)
Patient brief changed at 1500. Brief soiled but no blood clots found. UA collected and no visible blood clots noted. Patient vitals stable. Patient denies pain. Continue with plan of care.

## 2018-02-03 ENCOUNTER — Inpatient Hospital Stay (HOSPITAL_COMMUNITY): Payer: Medicare Other | Admitting: Physical Therapy

## 2018-02-03 ENCOUNTER — Inpatient Hospital Stay (HOSPITAL_COMMUNITY): Payer: Medicare Other | Admitting: Occupational Therapy

## 2018-02-03 ENCOUNTER — Inpatient Hospital Stay (HOSPITAL_COMMUNITY): Payer: Medicare Other

## 2018-02-03 ENCOUNTER — Inpatient Hospital Stay (HOSPITAL_COMMUNITY): Payer: Medicare Other | Admitting: Speech Pathology

## 2018-02-03 LAB — CBC WITH DIFFERENTIAL/PLATELET
Abs Immature Granulocytes: 0 10*3/uL (ref 0.0–0.1)
Basophils Absolute: 0.1 10*3/uL (ref 0.0–0.1)
Basophils Relative: 1 %
Eosinophils Absolute: 0.4 10*3/uL (ref 0.0–0.7)
Eosinophils Relative: 5 %
HCT: 31.3 % — ABNORMAL LOW (ref 36.0–46.0)
Hemoglobin: 9.9 g/dL — ABNORMAL LOW (ref 12.0–15.0)
Immature Granulocytes: 0 %
Lymphocytes Relative: 17 %
Lymphs Abs: 1.4 10*3/uL (ref 0.7–4.0)
MCH: 30.7 pg (ref 26.0–34.0)
MCHC: 31.6 g/dL (ref 30.0–36.0)
MCV: 97.2 fL (ref 78.0–100.0)
Monocytes Absolute: 0.7 10*3/uL (ref 0.1–1.0)
Monocytes Relative: 9 %
Neutro Abs: 5.4 10*3/uL (ref 1.7–7.7)
Neutrophils Relative %: 68 %
Platelets: 268 10*3/uL (ref 150–400)
RBC: 3.22 MIL/uL — ABNORMAL LOW (ref 3.87–5.11)
RDW: 13.7 % (ref 11.5–15.5)
WBC: 7.9 10*3/uL (ref 4.0–10.5)

## 2018-02-03 NOTE — Patient Care Conference (Signed)
Inpatient RehabilitationTeam Conference and Plan of Care Update Date: 02/03/2018   Time: 11:10 AM    Patient Name: Brooke Chavez      Medical Record Number: [id number]  Date of Birth: January 20, 1941 Sex: Female         Room/Bed: 4W22C/4W22C-01 Payor Info: Payor: MEDICARE / Plan: MEDICARE PART A AND B / Product Type: *No Product type* /    Admitting Diagnosis: CVA  Admit Date/Time:  01/27/2018  3:39 PM Admission Comments: No comment available   Primary Diagnosis:  <principal problem not specified> Principal Problem: <principal problem not specified>  Patient Active Problem List   Diagnosis Date Noted  . Benign essential HTN   . History of atrial fibrillation   . Stage 3 chronic kidney disease ([place])   . Hypoalbuminemia due to protein-calorie malnutrition ([place])   . Global aphasia   . Acute blood loss anemia   . 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 Gait disturbance, post-stroke   . Dysphagia, post-stroke   . Advance care planning   . Goals of care, counseling/discussion   . Palliative care by specialist   . Acute ischemic left MCA stroke ([place]) 01/24/2018  . Stroke (cerebrum) ([place]) 01/24/2018  . Encounter for therapeutic drug monitoring 01/18/2018  . 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 Chronic diastolic congestive heart failure ([place])   . 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 Liver masses 11/13/2017  . BMI 30.0-30.9,adult 07/24/2015  . Osteoporosis 09/19/2014  . GERD (gastroesophageal reflux disease) 07/29/2011  . Atrial flutter ([place]) 05/29/2010  . Hyperlipidemia with target LDL less than 100 01/04/2009  . Essential hypertension 01/04/2009    Expected Discharge Date: Expected Discharge Date: 02/13/18  Team Members Present: Physician leading conference: Dr. Delice Lesch Social Worker Present:  Ovidio Kin, LCSW Nurse Present: Other (comment)(Kayla Low-RN) PT Present: Michaelene Song, PT OT Present: Cherylynn Ridges, OT;Roanna Epley, COTA SLP Present: Windell Moulding, SLP PPS Coordinator present : Daiva Nakayama, RN, CRRN     Current Status/Progress Goal Weekly Team Focus  Medical   Right hemiparesis with expressive aphasia secondary to left MCA infarction  Improve mobility, follow up CT, optimize BP meds  See above   Bowel/Bladder   Cont of bowel and bladder in daytime. INC of bowel and bladder at night.   To reamin skin breakdown free.   Continue to keep skin clean and dry as needed.    Swallow/Nutrition/ Hydration   Dysphagia 3 with thin liquids with Min A for oral clearing with trials of regular  Supervision with least restrictive diet  trials of regular, use of lingual sweep for right oral clearing   ADL's   Min A transfers, Min A BADLs  Suoervision overall  R UE NMR, ataxia, apraxia, mdofied bathing/dressing, pt/family education   Mobility   min assist overall, gait w/ RW  supervision, gait household distances  R NMR, gait and transfers, family education, functional balance   Communication   Max A for gestural yes/no, severe expressive aphasia and apraxia of speech  Mod A with basic  naming objects, simple vowels   Safety/Cognition/ Behavioral Observations  Mod A for simple problem solving, Max A to scan to right of midline, Min A for selective attention  Min A  scanning to right, basic problem solving, selective attention in mildly distracting environment  Pain             Skin   medium size rght side of face open to area.   To heal properly without infection.   Continue to use ointment and prescribe.       *See Care Plan and progress notes for long and short-term goals.     Barriers to Discharge  Current Status/Progress Possible Resolutions Date Resolved   Physician    Medical stability     See above  Therapies, repeat CT, optimize BP meds, follow labs       Nursing                  PT  Home environment access/layout  3 steps to enter home w/o rails  have discussed getting rail or rails put up at home, children working on this           OT                  SLP                SW                Discharge Planning/Teaching Needs:  Home with daughter who works but Curator will be there or another family member. Pt will have 24 hr supervision at discharge.      Team Discussion:  Goals supervision level and currently min-mod level of assist. Working on balance and communication. On Dys 3 thin liquid diet. To repeat CT to see if can re-start blood thinner. BP issues adjusting meds. Family here daily attneding therapies.  Revisions to Treatment Plan:  DC 7/6    Continued Need for Acute Rehabilitation Level of Care: The patient requires daily medical management by a physician with specialized training in physical medicine and rehabilitation for the following conditions: Daily direction of a multidisciplinary physical rehabilitation program to ensure safe treatment while eliciting the highest outcome that is of practical value to the patient.: Yes Daily medical management of patient stability for increased activity during participation in an intensive rehabilitation regime.: Yes Daily analysis of laboratory values and/or radiology reports with any subsequent need for medication adjustment of medical intervention for : Neurological problems;Blood pressure problems;Renal problems  [name] 02/03/2018, 2:07 PM

## 2018-02-03 NOTE — Progress Notes (Addendum)
Occupational Therapy Weekly Progress Note  Patient Details  Name: Brooke Chavez MRN: [id number] Date of Birth: December 04, 1940  Beginning of progress report period: January 28, 2018 End of progress report period: February 03, 2018  Today's Date: 02/03/2018  Session 1 OT Individual Time: 0905-1000 OT Individual Time Calculation (min): 55 min   Session 2 OT Individual Time: 1430-1500 OT Individual Time Calculation (min): 30 min   Patient has met 4 of 4 short term goals.  Pt has made steady progress towards OT goals this week. She has progressed with functional ambulation and transfers to access bathroom and shower with min A using RW. Pt continues to be apraxic and needs multimodal cues to use items correctly and motor plan movements on the R side.   Patient continues to demonstrate the following deficits: muscle weakness, impaired timing and sequencing, abnormal tone, unbalanced muscle activation, motor apraxia, ataxia, decreased coordination and decreased motor planning, decreased midline orientation, decreased attention to right and ideational apraxia, decreased initiation, decreased attention, decreased awareness, decreased problem solving, decreased safety awareness, decreased memory and delayed processing and decreased sitting balance, decreased standing balance, decreased postural control, hemiplegia and decreased balance strategies and therefore will continue to benefit from skilled OT intervention to enhance overall performance with BADL and Reduce care partner burden.  Patient progressing toward long term goals..  Continue plan of care.  OT Short Term Goals Week 1:  OT Short Term Goal 1 (Week 1): Pt will look to the R to locate 1/4 grooming items with mod questioning cues. OT Short Term Goal 1 - Progress (Week 1): Met OT Short Term Goal 2 (Week 1): Pt will complete UB dressing using hemi techniques with mod questioning cues OT Short Term Goal 2 - Progress (Week 1): Met OT Short Term Goal 3  (Week 1): Pt will complete toilet transfer with Mod A consistent Mod A OT Short Term Goal 3 - Progress (Week 1): Met OT Short Term Goal 4 (Week 1): Pt will integrate R UE into bathing taks with no more than mod questioning cues OT Short Term Goal 4 - Progress (Week 1): Met Week 2:  OT Short Term Goal 1 (Week 2): STG=LTG 2/2 ELOS  Skilled Therapeutic Interventions/Progress Updates:  Session 1   Pt greeted seated in wc and agreeable to OT. OT treatment session focused on modified bathing/dressing, functional use of R UE, sit<>stand, and transfers. Pt impulsive to stand before wc breaks locked requiring mex multimodal cues to stop and lock her breaks before stepping into shower with CGA. Pt noted to have been incontinent of urine when  Removing brief Bathing completed with min verbal cues to integrate R UE into bathing tasks. Pt also needed verbal cues to move onto other body parts 2/2 perseveration. When washing buttocks, pt noted to have smear of BM and some bowel incontinence in shower. Pt denied needing to go to the bathroom, but OT insisted pt ambulate to toilet with RW and CGA. Pt voided bladder and bowel in commode. Dressing completed wc at the sink with mod verbal cues for hemi techniques. Pt left seated in wc at end of session with safety belt, chair alarm, and needs met.  Session 2 Pt greeted semi-reclined in bed and agreeable to OT. Pt came to sitting EOB w/ supervision, then donned  Shoes with increased time and set-up A. Stad-pivot to wc with supervision. Therapeutic activity using graded clothes pins and 3 color pattern. Pt initially needed instructional cues to follow 3 color pattern, but  after 3 repetitions, was able to continue pattern without cues. Intermittent steady A to achieve functional grasp on clothes pins with stronger resistance. Pt then completed graded peg board task focused on in-hand manipulation and rotation to place pegs. Pt returned to room and left seated in wc with safety  belt, chair alarm, and needs met.   Therapy Documentation Precautions:  Precautions Precautions: Fall Restrictions Weight Bearing Restrictions: No Pain:  none/denies pain ADL: ADL ADL Comments: Please see functional navigator  See Function Navigator for Current Functional Status.   Therapy/Group: Individual Therapy  Valma Cava 02/03/2018, 3:03 PM

## 2018-02-03 NOTE — Progress Notes (Signed)
Physical Therapy Session Note  Patient Details  Name: Brooke Chavez MRN: [id number] Date of Birth: 10-Aug-1941  Today's Date: 02/03/2018 PT Individual Time: 8309-4076 PT Individual Time Calculation (min): 60 min   Short Term Goals: Week 1:  PT Short Term Goal 1 (Week 1): Pt will transfer bed<>chair w/ mod assist towards R (hemi) side PT Short Term Goal 2 (Week 1): Pt will ambulate 25' w/ min assist w/ LRAD PT Short Term Goal 3 (Week 1): Pt will attend to R environment during functional mobility w/ min cues 50% of the time PT Short Term Goal 4 (Week 1): Pt will self-propel w/c 25' w/ min assist  Skilled Therapeutic Interventions/Progress Updates:    Pt seated in w/c upon PT arrival, agreeable to therapy tx and denies pain. Pt transported to the gym. Pt ambulated x 80 ft with RW and min assist, verbal cues for increased R step length and hip flexion. Pt incontinent of bladder, transported back to room. Pt ambulated from w/c>bathroom with RW and min assist. Pt maintained standing balance with CGA while therapist assisted with brief/clothing management, pt also with need to void again. Pt performed sit<>stand with supervision and verbal cues for hand placement, therapist donned clean briefs and pants. Pt ambulated to sink with min assist and washed hands. Pt transferred back to w/c and transported back to gym.  Pt ambulated 2 x 80 ft with RW and min assist, trial of toe up brace for foot clearance, pt with improved to clearance however continues to be limited by hip flexor weakness. Pt performed standing marches with RW working on hip flexion 2 x 20. Pt transferred to w/c and transported back to room, left seated in w/c in care of family.   Therapy Documentation Precautions:  Precautions Precautions: Fall Restrictions Weight Bearing Restrictions: No   See Function Navigator for Current Functional Status.   Therapy/Group: Individual Therapy  Netta Corrigan, PT, DPT 02/03/2018, 7:53 AM

## 2018-02-03 NOTE — Progress Notes (Signed)
Social Work   Brooke Chavez, Brooke Chavez  Social Worker  Physical Medicine and Rehabilitation  Patient Care Conference  Signed  Date of Service:  02/03/2018  2:07 PM          Signed          Show:Clear all [x] Manual[x] Template[] Copied  Added by: [x] Brooke Chavez, Brooke Rhyme, LCSW   [] Hover for details   Inpatient RehabilitationTeam Conference and Plan of Care Update Date: 02/03/2018   Time: 11:10 AM      Patient Name: Brooke Chavez      Medical Record Number: [id number]  Date of Birth: 1941/06/22 Sex: Female         Room/Bed: 4W22C/4W22C-01 Payor Info: Payor: MEDICARE / Plan: MEDICARE PART A AND B / Product Type: *No Product type* /     Admitting Diagnosis: CVA  Admit Date/Time:  01/27/2018  3:39 PM Admission Comments: No comment available    Primary Diagnosis:  <principal problem not specified> Principal Problem: <principal problem not specified>       Patient Active Problem List    Diagnosis Date Noted  . Benign essential HTN    . History of atrial fibrillation    . Stage 3 chronic kidney disease ([place])    . Hypoalbuminemia due to protein-calorie malnutrition ([place])    . Global aphasia    . Acute blood loss anemia    . 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 Gait disturbance, post-stroke    . Dysphagia, post-stroke    . Advance care planning    . Goals of care, counseling/discussion    . Palliative care by specialist    . Acute ischemic left MCA stroke ([place]) 01/24/2018  . Stroke (cerebrum) ([place]) 01/24/2018  . Encounter for therapeutic drug monitoring 01/18/2018  . 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 Chronic diastolic congestive heart failure ([place])    . 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 Liver masses 11/13/2017  . BMI 30.0-30.9,adult 07/24/2015  . Osteoporosis 09/19/2014  .  GERD (gastroesophageal reflux disease) 07/29/2011  . Atrial flutter ([place]) 05/29/2010  . Hyperlipidemia with target LDL less than 100 01/04/2009  . Essential hypertension 01/04/2009      Expected Discharge Date: Expected Discharge Date: 02/13/18   Team Members Present: Physician leading conference: Dr. Delice Lesch Social Worker Present: Brooke Kin, LCSW Nurse Present: Other (comment)(Brooke Chavez) PT Present: Brooke Chavez, PT OT Present: Brooke Chavez, OT;Brooke Chavez, COTA SLP Present: Brooke Chavez, SLP PPS Coordinator present : Brooke Nakayama, RN, CRRN       Current Status/Progress Goal Weekly Team Focus  Medical     Right hemiparesis with expressive aphasia secondary to left MCA infarction  Improve mobility, follow up CT, optimize BP meds  See above   Bowel/Bladder     Cont of bowel and bladder in daytime. INC of bowel and bladder at night.   To reamin skin breakdown free.   Continue to keep skin clean and dry as needed.    Swallow/Nutrition/ Hydration     Dysphagia 3 with thin liquids with Min A for oral clearing with trials of regular  Supervision with least restrictive diet  trials of regular, use of lingual sweep for right oral clearing   ADL's     Min A transfers, Min A BADLs  Suoervision overall  R UE NMR, ataxia, apraxia, mdofied bathing/dressing, pt/family education   Mobility     min assist overall, gait w/ RW  supervision, gait household distances  R NMR, gait and transfers, family education, functional balance   Communication     Max A for gestural yes/no, severe expressive aphasia and apraxia of speech  Mod A with basic  naming objects, simple vowels   Safety/Cognition/ Behavioral Observations   Mod A for simple problem solving, Max A to scan to right of midline, Min A for selective attention  Min A  scanning to right, basic problem solving, selective attention in mildly distracting environment   Pain               Skin     medium size rght side of face open to  area.   To heal properly without infection.   Continue to use ointment and prescribe.      *See Care Plan and progress notes for long and short-term goals.      Barriers to Discharge   Current Status/Progress Possible Resolutions Date Resolved   Physician     Medical stability     See above  Therapies, repeat CT, optimize BP meds, follow labs      Nursing                 PT  Home environment access/layout  3 steps to enter home w/o rails  have discussed getting rail or rails put up at home, children working on this           OT                 SLP            SW              Discharge Planning/Teaching Needs:  Home with daughter who works but Curator will be there or another family member. Pt will have 24 hr supervision at discharge.      Team Discussion:  Goals supervision level and currently min-mod level of assist. Working on balance and communication. On Dys 3 thin liquid diet. To repeat CT to see if can re-start blood thinner. BP issues adjusting meds. Family here daily attneding therapies.  Revisions to Treatment Plan:  DC 7/6    Continued Need for Acute Rehabilitation Level of Care: The patient requires daily medical management by a physician with specialized training in physical medicine and rehabilitation for the following conditions: Daily direction of a multidisciplinary physical rehabilitation program to ensure safe treatment while eliciting the highest outcome that is of practical value to the patient.: Yes Daily medical management of patient stability for increased activity during participation in an intensive rehabilitation regime.: Yes Daily analysis of laboratory values and/or radiology reports with any subsequent need for medication adjustment of medical intervention for : Neurological problems;Blood pressure problems;Renal problems   Brooke Chavez 02/03/2018, 2:07 PM                 Patient ID: Brooke Chavez, female   DOB: [birth year], 76 y.o.    MRN: [id number]

## 2018-02-03 NOTE — Plan of Care (Signed)
  Problem: Consults Goal: RH STROKE PATIENT EDUCATION Description See Patient Education module for education specifics  Outcome: Progressing   Problem: RH BOWEL ELIMINATION Goal: RH STG MANAGE BOWEL WITH ASSISTANCE Description STG Manage Bowel with moderate Assistance.  Outcome: Progressing Goal: RH STG MANAGE BOWEL W/MEDICATION W/ASSISTANCE Description STG Manage Bowel with Medication and min Assistance.  Outcome: Progressing   Problem: RH BLADDER ELIMINATION Goal: RH STG MANAGE BLADDER WITH ASSISTANCE Description STG Manage Bladder With moderate Assistance  Outcome: Progressing Goal: RH STG MANAGE BLADDER WITH EQUIPMENT WITH ASSISTANCE Description STG Manage Bladder With Equipment With moderate Assistance  Outcome: Progressing   Problem: RH SKIN INTEGRITY Goal: RH STG SKIN FREE OF INFECTION/BREAKDOWN Description Skin to remain free from infection or breakdown while on rehab with min assist  Outcome: Progressing Goal: RH STG MAINTAIN SKIN INTEGRITY WITH ASSISTANCE Description STG Maintain Skin Integrity With min Assistance.  Outcome: Progressing Goal: RH STG ABLE TO PERFORM INCISION/WOUND CARE W/ASSISTANCE Description STG Able To Perform MASD Care With moderate Assistance.  Outcome: Progressing   Problem: RH SAFETY Goal: RH STG ADHERE TO SAFETY PRECAUTIONS W/ASSISTANCE/DEVICE Description STG Adhere to Safety Precautions With moderate Assistance and appropriate assistive Device.  Outcome: Progressing Goal: RH STG DECREASED RISK OF FALL WITH ASSISTANCE Description STG Decreased Risk of Fall With moderate Assistance.  Outcome: Progressing   Problem: RH PAIN MANAGEMENT Goal: RH STG PAIN MANAGED AT OR BELOW PT'S PAIN GOAL Description <3 on a 0-10 pain scale  Outcome: Progressing

## 2018-02-03 NOTE — Progress Notes (Signed)
Subjective/Complaints: Patient seen lying in bed this AM.  Sister at bedside.  No reported issues overnight.    Review of systems: unable to obtain secondary to aphasia  Objective: Vital Signs: Blood pressure (!) 151/63, pulse 60, temperature 98.5 F (36.9 C), temperature source Oral, resp. rate 18, height 5\' 3"  (1.6 m), weight 73.3 kg (161 lb 9.6 oz), last menstrual period 11/05/1992, SpO2 98 %. No results found. Results for orders placed or performed during the hospital encounter of 01/27/18 (from the past 72 hour(s))  Urinalysis, Routine w reflex microscopic     Status: Abnormal   Collection Time: 02/02/18 10:09 AM  Result Value Ref Range   Color, Urine YELLOW YELLOW   APPearance HAZY (A) CLEAR   Specific Gravity, Urine 1.015 1.005 - 1.030   pH 5.0 5.0 - 8.0   Glucose, UA NEGATIVE NEGATIVE mg/dL   Hgb urine dipstick LARGE (A) NEGATIVE   Bilirubin Urine NEGATIVE NEGATIVE   Ketones, ur NEGATIVE NEGATIVE mg/dL   Protein, ur 30 (A) NEGATIVE mg/dL   Nitrite NEGATIVE NEGATIVE   Leukocytes, UA MODERATE (A) NEGATIVE   RBC / HPF >50 (H) 0 - 5 RBC/hpf   WBC, UA >50 (H) 0 - 5 WBC/hpf   Bacteria, UA RARE (A) NONE SEEN   Squamous Epithelial / LPF 0-5 0 - 5   Mucus PRESENT    Hyaline Casts, UA PRESENT     Comment: Performed at [facility], [address], [place], [place] [address]  CBC with Differential/Platelet     Status: Abnormal   Collection Time: 02/03/18  5:08 AM  Result Value Ref Range   WBC 7.9 4.0 - 10.5 K/uL   RBC 3.22 (L) 3.87 - 5.11 MIL/uL   Hemoglobin 9.9 (L) 12.0 - 15.0 g/dL   HCT 31.3 (L) 36.0 - 46.0 %   MCV 97.2 78.0 - 100.0 fL   MCH 30.7 26.0 - 34.0 pg   MCHC 31.6 30.0 - 36.0 g/dL   RDW 13.7 11.5 - 15.5 %   Platelets 268 150 - 400 K/uL   Neutrophils Relative % 68 %   Neutro Abs 5.4 1.7 - 7.7 K/uL   Lymphocytes Relative 17 %   Lymphs Abs 1.4 0.7 - 4.0 K/uL   Monocytes Relative 9 %   Monocytes Absolute 0.7 0.1 - 1.0 K/uL   Eosinophils Relative 5 %   Eosinophils Absolute 0.4 0.0 - 0.7 K/uL   Basophils Relative 1 %   Basophils Absolute 0.1 0.0 - 0.1 K/uL   Immature Granulocytes 0 %   Abs Immature Granulocytes 0.0 0.0 - 0.1 K/uL    Comment: Performed at [facility] [address], [place], [place] [address]     Constitutional: No distress . Vital signs reviewed. HENT: Normocephalic.  Atraumatic. Eyes: EOMI. No discharge. Cardiovascular: RRR. No JVD. Respiratory: CTA Bilaterally.  Normal effort. GI: BS +. Non-distended. Musc: No edema or tenderness in extremities. Neuro: Alert Global aphasia Limited ability to follow commands, ?4/5 throughout (unchanged) Psych: Unable to assess due to aphasia  Assessment/Plan: 1. 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 Physiatrist is providing close team supervision and 24 hour management of active medical problems listed below. Physiatrist and rehab team continue to assess barriers to discharge/monitor patient progress toward functional and medical goals. FIM: Function - Bathing Position: Shower Body parts bathed by patient: Right arm, Left  arm, Chest, Left upper leg, Right upper leg, Front perineal area, Abdomen, Buttocks, Right lower leg, Left lower leg Body parts bathed by helper: Right lower leg, Left lower leg, Buttocks Assist Level: Touching or steadying assistance(Pt > 75%)  Function- Upper Body Dressing/Undressing What is the patient wearing?: Pull over shirt/dress Pull over shirt/dress - Perfomed by patient: Thread/unthread right sleeve, Thread/unthread left sleeve, Put head through opening, Pull shirt over trunk Pull over shirt/dress - Perfomed by helper: Pull shirt over trunk, Thread/unthread right sleeve Assist Level: Set up, More than reasonable time Function - Lower Body Dressing/Undressing What is the patient wearing?: Pants,  Socks, Shoes Position: Sitting EOB Pants- Performed by patient: Thread/unthread right pants leg, Thread/unthread left pants leg, Pull pants up/down Pants- Performed by helper: Thread/unthread right pants leg Non-skid slipper socks- Performed by patient: Don/doff left sock Non-skid slipper socks- Performed by helper: Don/doff right sock, Don/doff left sock Socks - Performed by patient: Don/doff left sock, Don/doff right sock Socks - Performed by helper: Don/doff right sock Shoes - Performed by patient: Don/doff right shoe, Don/doff left shoe, Fasten right, Fasten left Shoes - Performed by helper: Don/doff right shoe Assist for footwear: Supervision/touching assist Assist for lower body dressing: Touching or steadying assistance (Pt > 75%)  Function - Toileting Toileting steps completed by helper: Adjust clothing prior to toileting, Performs perineal hygiene, Adjust clothing after toileting Toileting Assistive Devices: Grab bar or rail Assist level: Touching or steadying assistance (Pt.75%)  Function - Air cabin crew transfer activity did not occur: Safety/medical concerns Toilet transfer assistive device: Grab bar, Environmental manager lift: Stedy Assist level to toilet: Touching or steadying assistance (Pt > 75%) Assist level from toilet: Touching or steadying assistance (Pt > 75%)  Function - Chair/bed transfer Chair/bed transfer method: Stand pivot Chair/bed transfer assist level: Touching or steadying assistance (Pt > 75%) Chair/bed transfer assistive device: Armrests, Walker Chair/bed transfer details: Verbal cues for technique, Verbal cues for precautions/safety, Manual facilitation for weight shifting  Function - Locomotion: Wheelchair Will patient use wheelchair at discharge?: Yes(TBD) Type: Manual Max wheelchair distance: 25' Assist Level: Total assistance (Pt < 25%) Wheel 50 feet with 2 turns activity did not occur: Safety/medical concerns Wheel 150 feet activity  did not occur: Safety/medical concerns Turns around,maneuvers to table,bed, and toilet,negotiates 3% grade,maneuvers on rugs and over doorsills: No Function - Locomotion: Ambulation Assistive device: Walker-rolling Max distance: 131ft  Assist level: Touching or steadying assistance (Pt > 75%) Assist level: Touching or steadying assistance (Pt > 75%) Walk 50 feet with 2 turns activity did not occur: Safety/medical concerns Assist level: Touching or steadying assistance (Pt > 75%) Walk 150 feet activity did not occur: Safety/medical concerns Assist level: Touching or steadying assistance (Pt > 75%) Walk 10 feet on uneven surfaces activity did not occur: Safety/medical concerns  Function - Comprehension Comprehension: Auditory Comprehension assist level: Understands basic 50 - 74% of the time/ requires cueing 25 - 49% of the time  Function - Expression Expression: Verbal Expression assist level: Expresses basic 25 - 49% of the time/requires cueing 50 - 75% of the time. Uses single words/gestures.  Function - Social Interaction Social Interaction assist level: Interacts appropriately 90% of the time - Needs monitoring or encouragement for participation or interaction.  Function - Problem Solving Problem solving assist level: Solves basic 50 - 74% of the time/requires cueing 25 - 49% of the time  Function - Memory Memory assist level: Recognizes or recalls 25 - 49% of the time/requires cueing 50 - 75% of  the time Patient normally able to recall (first 3 days only): None of the above  Medical Problem List and Plan: 1.Right hemiparesis with expressive aphasiasecondary to left MCA infarction  Cont CIR 2. DVT Prophylaxis/Anticoagulation: SCDs.   Head CT on 6/23 reviewed, showing persistent hemorrhagic transformation. Cont Aspirin, plan for repeat head CT~ 6/30 and it hemorrhage resolved may switch to coumadin per Neuro -discussed and educated family, educated sister again on 6/25 3.  Pain Management:Hydrocodone as needed 4. Mood:Provide emotional support 5. Neuropsych: This patientis notcapable of making decisions on herown behalf. 6. Skin/Wound Care:Routine skin checks 7. 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 Follow-up cardiology services. Chronic amiodarone on hold as patient's cardiac rate controlled. CHRONIC COUMADINcurrently on hold due to high risk of hemorrhage after recent CVA  9.Chronic diastolic congestive heart failure. Monitor for any signs of fluid overload Filed Weights   02/01/18 0445 02/02/18 0646 02/03/18 0324  Weight: 75.3 kg (166 lb 0.1 oz) 73.4 kg (161 lb 13.1 oz) 73.3 kg (161 lb 9.6 oz)   Stable on 6/26 10.Hypertension. Patient on amiodarone 200 mg daily, HCTZ 25 mg daily prior to admission. Resume as needed Vitals:   02/02/18 1946 02/03/18 0324  BP: (!) 156/50 (!) 151/63  Pulse: 76 60  Resp: 18 18  Temp: 98 F (36.7 C) 98.5 F (36.9 C)  SpO2: 100% 98%   Chlorthalidone 25 started on 6/25  Remains elevated, will consider further increase tomorrow 11.Hyperlipidemia. Lipitor 12.  Hx Afib but amiodarone held per cardiology due to junctional rythmn, asymptomatic 13. CKD  Creatinine 1.22 on 6/20  Cont to monitor 14. 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 Acute blood loss anemia  Hemoglobin 9.9 on 6/26  Continue to monitor  LOS (Days) 7 A FACE TO FACE EVALUATION WAS PERFORMED  Ankit Lorie Phenix 02/03/2018, 12:25 PM

## 2018-02-03 NOTE — Progress Notes (Signed)
Physical Therapy Session Note  Patient Details  Name: Brooke Chavez MRN: [id number] Date of Birth: 08-08-1941  Today's Date: 02/03/2018 PT Individual Time: 0830-0900 PT Individual Time Calculation (min): 30 min   Short Term Goals: Week 1:  PT Short Term Goal 1 (Week 1): Pt will transfer bed<>chair w/ mod assist towards R (hemi) side PT Short Term Goal 2 (Week 1): Pt will ambulate 25' w/ min assist w/ LRAD PT Short Term Goal 3 (Week 1): Pt will attend to R environment during functional mobility w/ min cues 50% of the time PT Short Term Goal 4 (Week 1): Pt will self-propel w/c 25' w/ min assist  Skilled Therapeutic Interventions/Progress Updates:   Pt received supine in bed and agreeable to PT. Supine>sit transfer with supervision assist and min cues for safety.   Supine>sit from slightly elevated position with supervision assist and heavy use of bed rail on the L. Sitting balacne EOB x 5 min while PT donned ted hose.   Sit<>stand transfer training completed x 10 throughout treatment with supervision assist for safety and min cues for proper UE placement not to pull on RW.   Gait training through hall of rehab unit 2 x 170f and 773fwith min assist from PT to facilitate improved wight shift to allow increased foot clearance on the R.   Standing balance and forced use of the RUE through dynavision program A, 4 rings x 1 min. Pt scored 17, 15, and 12 with min assist from PT to facilitate improved shoulder ROM on the R progressing to supervision assist. Min cues for technique from PT throughout including increased weight shifting and ROM, as well as to use pad of finger rather than fingernail.   [facility] to [facility] and all needs met.        Therapy Documentation Precautions:  Precautions Precautions: Fall Restrictions Weight Bearing Restrictions: No   Pain: Pain Assessment Pain Scale: 0-10 Pain Score: 0-No pain   See Function  Navigator for Current Functional Status.   Therapy/Group: Individual Therapy  AuLorie Phenix/26/2019, 10:36 AM

## 2018-02-03 NOTE — Progress Notes (Signed)
Social Work Patient ID: Brooke Chavez, female   DOB: 07/21/1941, 77 y.o.   MRN: [id number]  Met with pt and her sisters to inform team conference goals supervision level and target discharge date 7/6. All are pleased with how well she is doing and pt always has a sister here she has nine of them to [provide support and to cheer her on. Will work on discharge needs.

## 2018-02-03 NOTE — Progress Notes (Signed)
Speech Language Pathology Daily Session Note  Patient Details  Name: Brooke Chavez MRN: [id number] Date of Birth: 06-19-1941  Today's Date: 02/03/2018 SLP Individual Time: 1100-1130 SLP Individual Time Calculation (min): 30 min  Short Term Goals: Week 1: SLP Short Term Goal 1 (Week 1): Pt will match picture to object in field of 2 objects with ~ 90% accuracy and Mod A cues.  SLP Short Term Goal 2 (Week 1): Pt will answer simple yes/no questions with ~ 75% accuracy and Max A cues.  SLP Short Term Goal 3 (Week 1): Pt will follow 1 step basic directions related to self in 8 out of 10 opportunities with Max A cues.  SLP Short Term Goal 4 (Week 1): Pt will imitate consonant vowel combinations with 25% accuracy and Max A cues.  SLP Short Term Goal 5 (Week 1): Pt will consume regular textures with Min A cues for use of compensatory swallow strategies for complete oral clearing to demonstrate readiness for diet upgrade.   Skilled Therapeutic Interventions: Skilled treatment session focused on communication goals. SLP facilitated session by providing Max A multimodal cues to produce any bilabial consonants. Pt able to produce x2 but then presents with severe oral groping and inability to repeat or model with vowels. MIT attempted with phrase "I love you" with increased ability noted. SLP further facilitated session by having pt singing well-known songs. Pt with significant increase in correct articulatory placement.   Later in day, daughter was in to visit. List of songs given to daughter for them to practice.     Function:  Eating Eating   Modified Consistency Diet: Yes Eating Assist Level: More than reasonable amount of time;Set up assist for   Eating Set Up Assist For: Opening containers;Cutting food       Cognition Comprehension Comprehension assist level: Understands basic 50 - 74% of the time/ requires cueing 25 - 49% of the time  Expression   Expression assist level: Expresses basic  25 - 49% of the time/requires cueing 50 - 75% of the time. Uses single words/gestures.  Social Interaction Social Interaction assist level: Interacts appropriately 90% of the time - Needs monitoring or encouragement for participation or interaction.  Problem Solving Problem solving assist level: Solves basic 50 - 74% of the time/requires cueing 25 - 49% of the time  Memory Memory assist level: Recognizes or recalls 25 - 49% of the time/requires cueing 50 - 75% of the time    Pain    Therapy/Group: Individual Therapy  Arliss Frisina 02/03/2018, 1:47 PM

## 2018-02-04 ENCOUNTER — Inpatient Hospital Stay (HOSPITAL_COMMUNITY): Payer: Medicare Other | Admitting: Occupational Therapy

## 2018-02-04 ENCOUNTER — Inpatient Hospital Stay (HOSPITAL_COMMUNITY): Payer: Medicare Other | Admitting: Physical Therapy

## 2018-02-04 ENCOUNTER — Inpatient Hospital Stay (HOSPITAL_COMMUNITY): Payer: Medicare Other | Admitting: Speech Pathology

## 2018-02-04 DIAGNOSIS — Z9889 Other specified postprocedural states: Secondary | ICD-10-CM

## 2018-02-04 DIAGNOSIS — N390 Urinary tract infection, site not specified: Secondary | ICD-10-CM

## 2018-02-04 MED ORDER — NITROFURANTOIN MONOHYD MACRO 100 MG PO CAPS
100.0000 mg | ORAL_CAPSULE | Freq: Two times a day (BID) | ORAL | Status: AC
  Administered 2018-02-04 – 2018-02-10 (×14): 100 mg via ORAL
  Filled 2018-02-04 (×14): qty 1

## 2018-02-04 MED ORDER — CHLORTHALIDONE 50 MG PO TABS
50.0000 mg | ORAL_TABLET | Freq: Every day | ORAL | Status: DC
  Administered 2018-02-05 – 2018-02-11 (×7): 50 mg via ORAL
  Filled 2018-02-04 (×7): qty 1

## 2018-02-04 MED ORDER — CHLORTHALIDONE 25 MG PO TABS
25.0000 mg | ORAL_TABLET | Freq: Once | ORAL | Status: AC
  Administered 2018-02-04: 25 mg via ORAL
  Filled 2018-02-04: qty 1

## 2018-02-04 NOTE — Progress Notes (Signed)
Occupational Therapy Session Note  Patient Details  Name: Brooke Chavez MRN: [id number] Date of Birth: [birth year]  Today's Date: 02/04/2018 OT Individual Time: 1300-1400 OT Individual Time Calculation (min): 60 min    Short Term Goals: Week 2:  OT Short Term Goal 1 (Week 2): STG=LTG 2/2 ELOS  Skilled Therapeutic Interventions/Progress Updates:    Pt seen for OT session focusing on R attention, R neuro re-ed, and attempting word finding/creation. Pt sitting up in w/c upon arrival with family members present. Pt requiring max cuing for head turning to locate family members. She was unable to initiate, create, or replicate names of family members.  She was taken to therapy gym total A in w/c for time and energy conservation. Completed sorting task using 2 different colored beanbags. Pt initially required max- total A for sorting, however, quickly progressed to being able to complete at supervision-min A level. Upgraded to being able to sort 3 different colors, same assist level as above required. Despite max multimodal cuing, pt was unable to name colors despite being provided written word as well as therapist assist.  Pt used R UE to place bean bags demonstrating minimal apraxic movements during tasks.   Pt left seated in w/c at end of session, all needs in reach.   Therapy Documentation Precautions:  Precautions Precautions: Fall Restrictions Weight Bearing Restrictions: No Pain:   No/denies pain ADL: ADL ADL Comments: Please see functional navigator  See Function Navigator for Current Functional Status.   Therapy/Group: Individual Therapy  Irish Piech L 02/04/2018, 7:10 AM

## 2018-02-04 NOTE — Progress Notes (Signed)
Physical Therapy Weekly Progress Note  Patient Details  Name: Brooke Chavez MRN: [id number] Date of Birth: 1940/11/20  Beginning of progress report period: January 28, 2018 End of progress report period: February 04, 2018  Today's Date: 02/04/2018 PT Individual Time: 1400-1450 PT Individual Time Calculation (min): 50 min   Patient has met 3 of 4 short term goals. Pt is progressing well with therapies, requiring min assist overall for all mobility including bed mobility, transfers, and gait. She requires most assistance for RLE management, however this is improving on a daily basis, and is ambulating household distances w/ RW. Multiple family members and caregivers have been present on a daily basis, and are making appropriate plans for d/c.   Patient continues to demonstrate the following deficits muscle weakness, decreased cardiorespiratoy endurance, impaired timing and sequencing, motor apraxia, decreased coordination and decreased motor planning, decreased visual perceptual skills, decreased attention to right, decreased initiation, decreased attention, decreased awareness, decreased problem solving, decreased safety awareness, decreased memory and delayed processing and decreased standing balance, decreased postural control, hemiplegia and decreased balance strategies and therefore will continue to benefit from skilled PT intervention to increase functional independence with mobility.  Patient progressing toward long term goals..  Continue plan of care.  PT Short Term Goals Week 1:  PT Short Term Goal 1 (Week 1): Pt will transfer bed<>chair w/ mod assist towards R (hemi) side PT Short Term Goal 1 - Progress (Week 1): Met PT Short Term Goal 2 (Week 1): Pt will ambulate 25' w/ min assist w/ LRAD PT Short Term Goal 2 - Progress (Week 1): Met PT Short Term Goal 3 (Week 1): Pt will attend to R environment during functional mobility w/ min cues 50% of the time PT Short Term Goal 3 - Progress (Week 1):  Met PT Short Term Goal 4 (Week 1): Pt will self-propel w/c 25' w/ min assist PT Short Term Goal 4 - Progress (Week 1): Discontinued (comment)(Anticipate pt will be primary ambulator at d/c, pt unable to coordinate w/c propulsion 2/2 apraxia) Week 2:  PT Short Term Goal 1 (Week 2): =LTGs due to ELOS  Skilled Therapeutic Interventions/Progress Updates:   Pt received in w/c at end of session in therapy gym, no c/o pain this session. Session focused on functional mobility including stairs negotiation and gait, and NMR and R attention. Pt negotiated 4 steps w/ min assist utilizing bilateral handrails and verbal/tactile cues for technique. Attempted to instruct pt in self-propelling w/c w/ BUEs, pt unable to self-propel 2/2 apraxia despite max mutlimodal cues. Worked on gait w/ various orthotic devices including R foot-up brace and L heel lift. Pt w/ 4/5 R DF strength, continued to shuffle R foot w/ R foot-up brace. Trial-ed L heel lift w/ improved R foot clearance during gait. Will continue to work towards safe household ambulation w/ appropriate orthotic support and AD. Worked on standing balance and endurance during 2nd half of session while standing at dynavision w/o UE support in 3-4 min bouts w/ close supervision. Pt w/ average reaction time of 2.18 sec spanning entire board, no verbal cues required to scan to R side although pt required increased time to do so. Average reaction time w/ addition of dual attention task, 2.33 sec. Ambulated back to room and ended session in w/c and in care of family, all needs met.   Therapy Documentation Precautions:  Precautions Precautions: Fall Restrictions Weight Bearing Restrictions: No  See Function Navigator for Current Functional Status.  Therapy/Group: Individual Therapy  [name]  K Arnette 02/04/2018, 2:56 PM

## 2018-02-04 NOTE — Progress Notes (Signed)
Subjective/Complaints: Pt seen lying in bed this AM.  Daughter at bedside. She has questions regarding anticoagulation again.   Review of systems: Unable to obtain secondary to aphasia  Objective: Vital Signs: Blood pressure (!) 152/73, pulse 73, temperature 98.6 F (37 C), temperature source Oral, resp. rate 18, height 5\' 3"  (1.6 m), weight 73.3 kg (161 lb 9.6 oz), last menstrual period 11/05/1992, SpO2 100 %. No results found. Results for orders placed or performed during the hospital encounter of 01/27/18 (from the past 72 hour(s))  Urinalysis, Routine w reflex microscopic     Status: Abnormal   Collection Time: 02/02/18 10:09 AM  Result Value Ref Range   Color, Urine YELLOW YELLOW   APPearance HAZY (A) CLEAR   Specific Gravity, Urine 1.015 1.005 - 1.030   pH 5.0 5.0 - 8.0   Glucose, UA NEGATIVE NEGATIVE mg/dL   Hgb urine dipstick LARGE (A) NEGATIVE   Bilirubin Urine NEGATIVE NEGATIVE   Ketones, ur NEGATIVE NEGATIVE mg/dL   Protein, ur 30 (A) NEGATIVE mg/dL   Nitrite NEGATIVE NEGATIVE   Leukocytes, UA MODERATE (A) NEGATIVE   RBC / HPF >50 (H) 0 - 5 RBC/hpf   WBC, UA >50 (H) 0 - 5 WBC/hpf   Bacteria, UA RARE (A) NONE SEEN   Squamous Epithelial / LPF 0-5 0 - 5   Mucus PRESENT    Hyaline Casts, UA PRESENT     Comment: Performed at [facility], [address], [place], [place] [address]  CBC with Differential/Platelet     Status: Abnormal   Collection Time: 02/03/18  5:08 AM  Result Value Ref Range   WBC 7.9 4.0 - 10.5 K/uL   RBC 3.22 (L) 3.87 - 5.11 MIL/uL   Hemoglobin 9.9 (L) 12.0 - 15.0 g/dL   HCT 31.3 (L) 36.0 - 46.0 %   MCV 97.2 78.0 - 100.0 fL   MCH 30.7 26.0 - 34.0 pg   MCHC 31.6 30.0 - 36.0 g/dL   RDW 13.7 11.5 - 15.5 %   Platelets 268 150 - 400 K/uL   Neutrophils Relative % 68 %   Neutro Abs 5.4 1.7 - 7.7 K/uL   Lymphocytes Relative 17 %   Lymphs Abs 1.4 0.7 - 4.0 K/uL   Monocytes Relative 9 %   Monocytes Absolute 0.7 0.1 - 1.0 K/uL   Eosinophils  Relative 5 %   Eosinophils Absolute 0.4 0.0 - 0.7 K/uL   Basophils Relative 1 %   Basophils Absolute 0.1 0.0 - 0.1 K/uL   Immature Granulocytes 0 %   Abs Immature Granulocytes 0.0 0.0 - 0.1 K/uL    Comment: Performed at [facility] [address] [place], [place] [address]     Constitutional: No distress . Vital signs reviewed. HENT: Normocephalic.  Atraumatic. Eyes: EOMI. No discharge. Cardiovascular: RRR. No JVD. Respiratory: CTA Bilaterally.  Normal effort. GI: BS +. Non-distended. Musc: No edema or tenderness in extremities. Neuro: Alert Global aphasia Limited ability to follow commands, ?4/5 throughout (stable) Psych: Unable to assess due to aphasia  Assessment/Plan: 1. 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 Physiatrist is providing close team supervision and 24 hour management of active medical problems listed below. Physiatrist and rehab team continue to assess barriers to discharge/monitor patient progress toward functional and medical goals. FIM: Function - Bathing Position: Shower Body parts bathed by patient: Right arm,  Left arm, Chest, Left upper leg, Right upper leg, Front perineal area, Abdomen, Buttocks, Right lower leg, Left lower leg Body parts bathed by helper: Right lower leg, Left lower leg, Buttocks Assist Level: Touching or steadying assistance(Pt > 75%)  Function- Upper Body Dressing/Undressing What is the patient wearing?: Pull over shirt/dress Pull over shirt/dress - Perfomed by patient: Thread/unthread right sleeve, Thread/unthread left sleeve, Put head through opening, Pull shirt over trunk Pull over shirt/dress - Perfomed by helper: Pull shirt over trunk, Thread/unthread right sleeve Assist Level: Set up, More than reasonable time Function - Lower Body Dressing/Undressing What is the patient  wearing?: Pants, Socks, Shoes Position: Sitting EOB Pants- Performed by patient: Thread/unthread right pants leg, Thread/unthread left pants leg, Pull pants up/down Pants- Performed by helper: Thread/unthread right pants leg Non-skid slipper socks- Performed by patient: Don/doff left sock Non-skid slipper socks- Performed by helper: Don/doff right sock, Don/doff left sock Socks - Performed by patient: Don/doff left sock, Don/doff right sock Socks - Performed by helper: Don/doff right sock Shoes - Performed by patient: Don/doff right shoe, Don/doff left shoe, Fasten right, Fasten left Shoes - Performed by helper: Don/doff right shoe Assist for footwear: Supervision/touching assist Assist for lower body dressing: Touching or steadying assistance (Pt > 75%)  Function - Toileting Toileting steps completed by patient: Adjust clothing prior to toileting Toileting steps completed by helper: Performs perineal hygiene, Adjust clothing after toileting Toileting Assistive Devices: Grab bar or rail Assist level: Touching or steadying assistance (Pt.75%)  Function - Air cabin crew transfer activity did not occur: Safety/medical concerns Toilet transfer assistive device: Grab bar, Environmental manager lift: Stedy Assist level to toilet: Touching or steadying assistance (Pt > 75%) Assist level from toilet: Touching or steadying assistance (Pt > 75%)  Function - Chair/bed transfer Chair/bed transfer method: Stand pivot, Ambulatory Chair/bed transfer assist level: Touching or steadying assistance (Pt > 75%) Chair/bed transfer assistive device: Armrests, Walker Chair/bed transfer details: Verbal cues for technique, Verbal cues for precautions/safety, Manual facilitation for weight shifting  Function - Locomotion: Wheelchair Will patient use wheelchair at discharge?: Yes(TBD) Type: Manual Max wheelchair distance: 25' Assist Level: Total assistance (Pt < 25%) Wheel 50 feet with 2 turns  activity did not occur: Safety/medical concerns Wheel 150 feet activity did not occur: Safety/medical concerns Turns around,maneuvers to table,bed, and toilet,negotiates 3% grade,maneuvers on rugs and over doorsills: No Function - Locomotion: Ambulation Assistive device: Walker-rolling Max distance: 80 ft Assist level: Touching or steadying assistance (Pt > 75%) Assist level: Touching or steadying assistance (Pt > 75%) Walk 50 feet with 2 turns activity did not occur: Safety/medical concerns Assist level: Touching or steadying assistance (Pt > 75%) Walk 150 feet activity did not occur: Safety/medical concerns Assist level: Touching or steadying assistance (Pt > 75%) Walk 10 feet on uneven surfaces activity did not occur: Safety/medical concerns  Function - Comprehension Comprehension: Auditory Comprehension assist level: Understands basic 50 - 74% of the time/ requires cueing 25 - 49% of the time  Function - Expression Expression: Verbal Expression assist level: Expresses basic 25 - 49% of the time/requires cueing 50 - 75% of the time. Uses single words/gestures.  Function - Social Interaction Social Interaction assist level: Interacts appropriately 90% of the time - Needs monitoring or encouragement for participation or interaction.  Function - Problem Solving Problem solving assist level: Solves basic 50 - 74% of the time/requires cueing 25 - 49% of the time  Function - Memory Memory assist level: Recognizes or recalls 25 - 49% of  the time/requires cueing 50 - 75% of the time Patient normally able to recall (first 3 days only): None of the above  Medical Problem List and Plan: 1.Right hemiparesis with expressive aphasiasecondary to left MCA infarction  Cont CIR 2. DVT Prophylaxis/Anticoagulation: SCDs.   Head CT on 6/23 reviewed, showing persistent hemorrhagic transformation. Cont Aspirin, plan for repeat head CT~ 6/30 and it hemorrhage resolved may switch to coumadin per  Neuro -discussed and educated family, educated sister again on 6/25, and daughter again on 6/27 3. Pain Management:Hydrocodone as needed 4. Mood:Provide emotional support 5. Neuropsych: This patientis notcapable of making decisions on herown behalf. 6. Skin/Wound Care:Routine skin checks 7. 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 Follow-up cardiology services. Chronic amiodarone on hold as patient's cardiac rate controlled. CHRONIC COUMADINcurrently on hold due to high risk of hemorrhage after recent CVA  9.Chronic diastolic congestive heart failure. Monitor for any signs of fluid overload Filed Weights   02/02/18 0646 02/03/18 0324 02/04/18 0500  Weight: 73.4 kg (161 lb 13.1 oz) 73.3 kg (161 lb 9.6 oz) 73.3 kg (161 lb 9.6 oz)   Stable on 6/27 10.Hypertension. Patient on amiodarone 200 mg daily, HCTZ 25 mg daily prior to admission. Resume as needed Vitals:   02/03/18 2018 02/04/18 0347  BP: (!) 173/81 (!) 152/73  Pulse: 79 73  Resp:  18  Temp: 99 F (37.2 C) 98.6 F (37 C)  SpO2: 100% 100%   Chlorthalidone 25 started on 6/25, Increased to 50 on 6/27  11.Hyperlipidemia. Lipitor 12.  Hx Afib but amiodarone held per cardiology due to junctional rythmn, asymptomatic 13. CKD  Creatinine 1.22 on 6/20  Labs ordered for tomorrow  Cont to monitor 14. 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 Acute blood loss anemia  Hemoglobin 9.9 on 6/26  Continue to monitor  17. . Acute lower UTI  UA +, U culture pending  Empiric Macrobid started on 6/27  LOS (Days) 8 A FACE TO FACE EVALUATION WAS PERFORMED  Denean Pavon Lorie Phenix 02/04/2018, 9:47 AM

## 2018-02-04 NOTE — Progress Notes (Signed)
Speech Language Pathology Weekly Progress and Session Note  Patient Details  Name: Brooke Chavez MRN: [id number] Date of Birth: 1940/10/27  Beginning of progress report period: January 28, 2018 End of progress report period: February 04, 2018  Today's Date: 02/04/2018   Treatment session #1 SLP Individual Time: 0915-1000 SLP Individual Time Calculation (min): 45 min  SLP Individual Time: 1115-1200 SLP Individual Time Calculation (min): 45 min  Short Term Goals: Week 1: SLP Short Term Goal 1 (Week 1): Pt will match picture to object in field of 2 objects with ~ 90% accuracy and Mod A cues.  SLP Short Term Goal 1 - Progress (Week 1): Met SLP Short Term Goal 2 (Week 1): Pt will answer simple yes/no questions with ~ 75% accuracy and Max A cues.  SLP Short Term Goal 2 - Progress (Week 1): Met SLP Short Term Goal 3 (Week 1): Pt will follow 1 step basic directions related to self in 8 out of 10 opportunities with Max A cues.  SLP Short Term Goal 3 - Progress (Week 1): Met SLP Short Term Goal 4 (Week 1): Pt will imitate consonant vowel combinations with 25% accuracy and Max A cues.  SLP Short Term Goal 4 - Progress (Week 1): Not met SLP Short Term Goal 5 (Week 1): Pt will consume regular textures with Min A cues for use of compensatory swallow strategies for complete oral clearing to demonstrate readiness for diet upgrade.  SLP Short Term Goal 5 - Progress (Week 1): Progressing toward goal    New Short Term Goals: Week 2: SLP Short Term Goal 1 (Week 2): Using melodic carrier phrase, pt will state object name in 3 of 10 opportunities with Max A cues.  SLP Short Term Goal 2 (Week 2): Using melodic intonation, pt will produce common phrases with Max A cues in 5 out of 10 opportunities.  SLP Short Term Goal 3 (Week 2): Pt will follow 1 step directions with Mod A cues to achieve ~ 80% accuracy.  SLP Short Term Goal 4 (Week 2): Pt will scan to right of midline in 5 of 10 opportunities with Max A cues.   SLP Short Term Goal 5 (Week 2): Pt will consume regular textures with Min A cues for use of compensatory swallow strategies for complete oral clearing to demonstrate readiness for diet upgrade.   Weekly Progress Updates: Pt has made slow steady progress during this reporting period and as a result has met 3 of 5 STGs. Pt is able to select requested object in field of 2 to 3 with 100% accuracy (when placed vertically at midline). She has increased ability to follow 1 step directions as well as scanning slightly to right of midline. 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 She has recently demonstrated increased verbal abilities with MIT. Therefore skilled ST continues to be indicated to target expressive communication, further diet upgrade and increase cognitive independence.      Intensity: Minumum of 1-2 x/day, 30 to 90 minutes Frequency: 3 to 5 out of 7 days Duration/Length of Stay: 7/6 Treatment/Interventions: Cognitive remediation/compensation;Dysphagia/aspiration precaution training;Speech/Language facilitation;Functional tasks;Patient/family education;Therapeutic Activities   Daily Session  Skilled Therapeutic Interventions: Skilled treatment session 1 and 2 focused on communication goals. SLP facilitated session by providing visual representation, verbal and tactile cues for melodic tones paired with 3 different phrases. Pt with increased ability to say phrase with SLP. On occasion, pt begin to gropp but returning to humming phrase and then saying with SLP  broke incorrect motor patterns. Pt able to repeat phrase "I love you" with verbal model only. Pt with great progress using MIT and much improved motor movement with MIT cues.  Pt was left upright in wheelchair with family present.     Function:     Cognition Comprehension Comprehension assist level: Understands basic 75 - 89% of the time/ requires cueing 10 - 24% of the time  Expression   Expression assist  level: Expresses basic 25 - 49% of the time/requires cueing 50 - 75% of the time. Uses single words/gestures.  Social Interaction Social Interaction assist level: Interacts appropriately 90% of the time - Needs monitoring or encouragement for participation or interaction.  Problem Solving Problem solving assist level: Solves basic 75 - 89% of the time/requires cueing 10 - 24% of the time  Memory Memory assist level: Recognizes or recalls 75 - 89% of the time/requires cueing 10 - 24% of the time   General    Pain Pain Assessment Faces Pain Scale: No hurt  Therapy/Group: Individual Therapy  Brooke Chavez 02/04/2018, 12:06 PM

## 2018-02-04 NOTE — Progress Notes (Signed)
Urine sent to lab. Results pending.  Brooke Chavez

## 2018-02-04 NOTE — Plan of Care (Signed)
Lab work pending for possible infection.

## 2018-02-04 NOTE — Plan of Care (Signed)
  Problem: Consults Goal: RH STROKE PATIENT EDUCATION Description See Patient Education module for education specifics  Outcome: Progressing   Problem: RH BOWEL ELIMINATION Goal: RH STG MANAGE BOWEL WITH ASSISTANCE Description STG Manage Bowel with moderate Assistance.  Outcome: Progressing Goal: RH STG MANAGE BOWEL W/MEDICATION W/ASSISTANCE Description STG Manage Bowel with Medication and min Assistance.  Outcome: Progressing   Problem: RH BLADDER ELIMINATION Goal: RH STG MANAGE BLADDER WITH ASSISTANCE Description STG Manage Bladder With moderate Assistance  Outcome: Progressing Goal: RH STG MANAGE BLADDER WITH EQUIPMENT WITH ASSISTANCE Description STG Manage Bladder With Equipment With moderate Assistance  Outcome: Progressing   Problem: RH SKIN INTEGRITY Goal: RH STG SKIN FREE OF INFECTION/BREAKDOWN Description Skin to remain free from infection or breakdown while on rehab with min assist  Outcome: Progressing Goal: RH STG MAINTAIN SKIN INTEGRITY WITH ASSISTANCE Description STG Maintain Skin Integrity With min Assistance.  Outcome: Progressing Goal: RH STG ABLE TO PERFORM INCISION/WOUND CARE W/ASSISTANCE Description STG Able To Perform MASD Care With moderate Assistance.  Outcome: Progressing   Problem: RH SAFETY Goal: RH STG ADHERE TO SAFETY PRECAUTIONS W/ASSISTANCE/DEVICE Description STG Adhere to Safety Precautions With moderate Assistance and appropriate assistive Device.  Outcome: Progressing Goal: RH STG DECREASED RISK OF FALL WITH ASSISTANCE Description STG Decreased Risk of Fall With moderate Assistance.  Outcome: Progressing   Problem: RH PAIN MANAGEMENT Goal: RH STG PAIN MANAGED AT OR BELOW PT'S PAIN GOAL Description <3 on a 0-10 pain scale  Outcome: Progressing

## 2018-02-05 ENCOUNTER — Inpatient Hospital Stay (HOSPITAL_COMMUNITY): Payer: Medicare Other | Admitting: Speech Pathology

## 2018-02-05 ENCOUNTER — Inpatient Hospital Stay (HOSPITAL_COMMUNITY): Payer: Medicare Other | Admitting: Physical Therapy

## 2018-02-05 ENCOUNTER — Inpatient Hospital Stay (HOSPITAL_COMMUNITY): Payer: Medicare Other | Admitting: Occupational Therapy

## 2018-02-05 DIAGNOSIS — E876 Hypokalemia: Secondary | ICD-10-CM

## 2018-02-05 LAB — BASIC METABOLIC PANEL
Anion gap: 10 (ref 5–15)
BUN: 15 mg/dL (ref 8–23)
CO2: 26 mmol/L (ref 22–32)
Calcium: 8.9 mg/dL (ref 8.9–10.3)
Chloride: 105 mmol/L (ref 98–111)
Creatinine, Ser: 1.14 mg/dL — ABNORMAL HIGH (ref 0.44–1.00)
GFR calc Af Amer: 52 mL/min — ABNORMAL LOW (ref >60)
GFR calc non Af Amer: 45 mL/min — ABNORMAL LOW (ref >60)
Glucose, Bld: 98 mg/dL (ref 70–99)
Potassium: 3.3 mmol/L — ABNORMAL LOW (ref 3.5–5.1)
Sodium: 141 mmol/L (ref 135–145)

## 2018-02-05 LAB — URINE CULTURE

## 2018-02-05 MED ORDER — POTASSIUM CHLORIDE CRYS ER 20 MEQ PO TBCR
30.0000 meq | EXTENDED_RELEASE_TABLET | Freq: Two times a day (BID) | ORAL | Status: AC
  Administered 2018-02-05 (×2): 30 meq via ORAL
  Filled 2018-02-05 (×2): qty 1

## 2018-02-05 NOTE — Progress Notes (Signed)
Occupational Therapy Session Note  Patient Details  Name: GENEVRA ORNE MRN: [id number] Date of Birth: September 30, 1940  Today's Date: 02/05/2018 OT Individual Time: 0017-4944 OT Individual Time Calculation (min): 45 min    Short Term Goals: Week 2:  OT Short Term Goal 1 (Week 2): STG=LTG 2/2 ELOS  Skilled Therapeutic Interventions/Progress Updates:    Pt seen for OT ADL bathing/dressing session. Pt in supine upon arrival, awake and smiling in agreement to tx session and agreeable to bathing at shower level today. She transferred to EOB with supervision using hospital bed functions. She ambulated throughout room with min A using RW, min A required for RW management in functional context. Toileting task completed with steadying assist and assist for thoroughness of hygiene follow BM. Pt without awareness that buttock still not clean despite seeing soiled rag. This happened in the shower as well as pt cont to bathe with soiled washcloth without awareness. She bathed seated on BSC in shower, min cuing to attend to all areas of body. Pt able to alternate use of R/L extremities to assist with bathing. Demonstrates some impulsivity with lack of awareness of deficits, standing in shower without UE support in order to attempt buttock/pericare hygiene. She returend to w/c to dress, able to correctly orient shirt when presented with folded short. With increased time, able to bend down to floor to don B socks/shoes and tye shoes.  Following seated rest break, she completed oral care standing at sink, cuing required for sequencing/awareness as pt attempting to apply toothpaste to toothbrush cover without awareness of error. Steadying assist required initially, however, progressed to mod steadying assist with fatigue, pt without awareness of declining posture/ balance and required cuing to initaite seated rest break. Remainder of task completed from w/c level for energy conservation.  Pt left seated in w/c at end of  session, QRB and chair alarm on. All needs in reach with grandson present.    Therapy Documentation Precautions:  Precautions Precautions: Fall Restrictions Weight Bearing Restrictions: No Pain:   No/denies pain ADL: ADL ADL Comments: Please see functional navigator  See Function Navigator for Current Functional Status.   Therapy/Group: Individual Therapy  Angalena Cousineau L 02/05/2018, 7:03 AM

## 2018-02-05 NOTE — Progress Notes (Signed)
Physical Therapy Session Note  Patient Details  Name: Brooke Chavez MRN: [id number] Date of Birth: 10-Sep-1940  Today's Date: 02/05/2018 PT Individual Time: 1345-1455 PT Individual Time Calculation (min): 70 min   Short Term Goals: Week 2:  PT Short Term Goal 1 (Week 2): =LTGs due to ELOS  Skilled Therapeutic Interventions/Progress Updates:   Pt in w/c and agreeable to therapy, denies pain. Daughter present throughout session. Session focused on gait training and R NMR. Ambulated around unit w/ RW, min guard to close supervision w/ 1 LOB requiring min assist to correct. Verbal cues for R foot clearance, added heel lift in L shoe for improved foot clearance. Focused on facilitating R hip flexion in standing and during gait. Performed standing knee marches transitioning to knee marches w/ gait, however pt unable to sequence both gait and knee marches at the same time. Performed R stepping to visual targets on 2" step requiring pt to sequence by identifying colors therapist named. Accurate >90% of the time. Verbal and visual cues for increased R hip flexion to clear step. Performed NuStep 4 min x2 bouts at level 3 for reciprocal movement pattern and R NMR/muscle activation. Returned to room and educated daughter [name]) on providing min assist for transfers to/from bedside commode and to EOB. Daughter returned demonstration correctly and safely multiple times. Discussed having chair alarm and bed alarm off while [name] was present only and calling nurses station to get alarms turned back on prior to leaving room. Daughter verbalized understanding and in agreement. Ended session in w/c, call bell within reach and all needs met.   Therapy Documentation Precautions:  Precautions Precautions: Fall Restrictions Weight Bearing Restrictions: No  See Function Navigator for Current Functional Status.   Therapy/Group: Individual Therapy  Bandon Sherwin K Arnette 02/05/2018, 4:27 PM

## 2018-02-05 NOTE — Progress Notes (Signed)
Speech Language Pathology Daily Session Note  Patient Details  Name: Brooke Chavez MRN: [id number] Date of Birth: 23-Jun-1941  Today's Date: 02/05/2018   Skilled treatment session #1 SLP Individual Time: 3748-2707 SLP Individual Time Calculation (min): 45 min   Skilled treatment session #2 SLP Individual Time: 1115-1200 SLP Individual Time Calculation (min): 45 min  Short Term Goals: Week 2: SLP Short Term Goal 1 (Week 2): Using melodic carrier phrase, pt will state object name in 3 of 10 opportunities with Max A cues.  SLP Short Term Goal 2 (Week 2): Using melodic intonation, pt will produce common phrases with Max A cues in 5 out of 10 opportunities.  SLP Short Term Goal 3 (Week 2): Pt will follow 1 step directions with Mod A cues to achieve ~ 80% accuracy.  SLP Short Term Goal 4 (Week 2): Pt will scan to right of midline in 5 of 10 opportunities with Max A cues.  SLP Short Term Goal 5 (Week 2): Pt will consume regular textures with Min A cues for use of compensatory swallow strategies for complete oral clearing to demonstrate readiness for diet upgrade.   Skilled Therapeutic Interventions: Skilled treatment sessions focused on object naming within MIT. Given written simple sentence completion (paired with object), pt was able to simultaneously produce MIT of phrase and complete it with appropriate name of object. A different phrase with different intonation was used to created appropriate motor pattern to name a different object and helped prevent perseveration. After multiple repetitions of each phrase and object, pt was then able to use phrase with different object. Pt was not able to produce phrase without simultaneous SLP support. Pt with great progress in naming 5 objects this session. Also pt with increased appropriate spontaneous use of several phrases.      Function:    Cognition Comprehension Comprehension assist level: Understands basic 75 - 89% of the time/ requires cueing  10 - 24% of the time  Expression   Expression assist level: Expresses basic 25 - 49% of the time/requires cueing 50 - 75% of the time. Uses single words/gestures.  Social Interaction Social Interaction assist level: Interacts appropriately 90% of the time - Needs monitoring or encouragement for participation or interaction.  Problem Solving Problem solving assist level: Solves basic 75 - 89% of the time/requires cueing 10 - 24% of the time  Memory Memory assist level: Recognizes or recalls 75 - 89% of the time/requires cueing 10 - 24% of the time;Recognizes or recalls 90% of the time/requires cueing < 10% of the time    Pain    Therapy/Group: Individual Therapy  Aerial Dilley 02/05/2018, 12:51 PM

## 2018-02-05 NOTE — Progress Notes (Signed)
Subjective/Complaints: Patient seen lying in bed this morning. No reported issues overnight. Grandson present, but was not at bedside overnight.  Review of systems: Unable to obtain secondary to aphasia.  Objective: Vital Signs: Blood pressure (!) 155/77, pulse 63, temperature 98.4 F (36.9 C), temperature source Oral, resp. rate 20, height _0  (1.6 m), weight 70.8 kg (156 lb 1.4 oz), last menstrual period 11/05/1992, SpO2 100 %. No results found. Results for orders placed or performed during the hospital encounter of 01/27/18 (from the past 72 hour(s))  CBC with Differential/Platelet     Status: Abnormal   Collection Time: 02/03/18  5:08 AM  Result Value Ref Range   WBC 7.9 4.0 - 10.5 K/uL   RBC 3.22 (L) 3.87 - 5.11 MIL/uL   Hemoglobin 9.9 (L) 12.0 - 15.0 g/dL   HCT 31.3 (L) 36.0 - 46.0 %   MCV 97.2 78.0 - 100.0 fL   MCH 30.7 26.0 - 34.0 pg   MCHC 31.6 30.0 - 36.0 g/dL   RDW 13.7 11.5 - 15.5 %   Platelets 268 150 - 400 K/uL   Neutrophils Relative % 68 %   Neutro Abs 5.4 1.7 - 7.7 K/uL   Lymphocytes Relative 17 %   Lymphs Abs 1.4 0.7 - 4.0 K/uL   Monocytes Relative 9 %   Monocytes Absolute 0.7 0.1 - 1.0 K/uL   Eosinophils Relative 5 %   Eosinophils Absolute 0.4 0.0 - 0.7 K/uL   Basophils Relative 1 %   Basophils Absolute 0.1 0.0 - 0.1 K/uL   Immature Granulocytes 0 %   Abs Immature Granulocytes 0.0 0.0 - 0.1 K/uL    Comment: Performed at [facility], [address], [place], [place] [address]  Basic metabolic panel     Status: Abnormal   Collection Time: 02/05/18  5:19 AM  Result Value Ref Range   Sodium 141 135 - 145 mmol/L   Potassium 3.3 (L) 3.5 - 5.1 mmol/L   Chloride 105 98 - 111 mmol/L    Comment: Please note change in reference range.   CO2 26 22 - 32 mmol/L   Glucose, Bld 98 70 - 99 mg/dL    Comment: Please note change in reference range.   BUN 15 8 - 23 mg/dL    Comment: Please note change in reference range.   Creatinine, Ser 1.14 (H) 0.44 - 1.00  mg/dL   Calcium 8.9 8.9 - 10.3 mg/dL   GFR calc non Af Amer 45 (L) >60 mL/min   GFR calc Af Amer 52 (L) >60 mL/min    Comment: (NOTE) The eGFR has been calculated using the CKD EPI equation. This calculation has not been validated in all clinical situations. eGFR's persistently <60 mL/min signify possible Chronic Kidney Disease.    Anion gap 10 5 - 15    Comment: Performed at [facility] [address], [place], [place] [address]     Constitutional: No distress . Vital signs reviewed. HENT: Normocephalic.  Atraumatic. Eyes: EOMI. No discharge. Cardiovascular: RRR. No JVD. Respiratory: CTA Bilaterally. Normal effort. GI: BS +. Non-distended. Musc: No edema or tenderness in extremities. Neuro: Alert Global aphasia Limited ability to follow commands, ?4/5 throughout (unchanged) Psych: Unable to assess due to aphasia  Assessment/Plan: 1. 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 Physiatrist is providing close team supervision and 24 hour management of active  medical problems listed below. Physiatrist and rehab team continue to assess barriers to discharge/monitor patient progress toward functional and medical goals. FIM: Function - Bathing Position: Shower Body parts bathed by patient: Right arm, Left arm, Chest, Left upper leg, Right upper leg, Front perineal area, Abdomen, Buttocks, Right lower leg, Left lower leg Body parts bathed by helper: Back Assist Level: Touching or steadying assistance(Pt > 75%)  Function- Upper Body Dressing/Undressing What is the patient wearing?: Pull over shirt/dress Pull over shirt/dress - Perfomed by patient: Thread/unthread right sleeve, Thread/unthread left sleeve, Put head through opening, Pull shirt over trunk Pull over shirt/dress - Perfomed by helper: Pull shirt over trunk, Thread/unthread  right sleeve Assist Level: Supervision or verbal cues Function - Lower Body Dressing/Undressing What is the patient wearing?: Pants, Socks, Shoes Position: Wheelchair/chair at sink Pants- Performed by patient: Thread/unthread right pants leg, Thread/unthread left pants leg, Pull pants up/down Pants- Performed by helper: Thread/unthread right pants leg Non-skid slipper socks- Performed by patient: Don/doff left sock Non-skid slipper socks- Performed by helper: Don/doff right sock, Don/doff left sock Socks - Performed by patient: Don/doff left sock, Don/doff right sock Socks - Performed by helper: Don/doff right sock Shoes - Performed by patient: Don/doff right shoe, Don/doff left shoe, Fasten right, Fasten left Shoes - Performed by helper: Don/doff right shoe Assist for footwear: Supervision/touching assist Assist for lower body dressing: Touching or steadying assistance (Pt > 75%)  Function - Toileting Toileting steps completed by patient: Adjust clothing prior to toileting Toileting steps completed by helper: Performs perineal hygiene, Adjust clothing after toileting Toileting Assistive Devices: Grab bar or rail Assist level: Touching or steadying assistance (Pt.75%)  Function - Air cabin crew transfer activity did not occur: Safety/medical concerns Toilet transfer assistive device: Grab bar Mechanical lift: Stedy Assist level to toilet: Touching or steadying assistance (Pt > 75%) Assist level from toilet: Touching or steadying assistance (Pt > 75%)  Function - Chair/bed transfer Chair/bed transfer method: Stand pivot, Ambulatory Chair/bed transfer assist level: Supervision or verbal cues Chair/bed transfer assistive device: Armrests, Walker Chair/bed transfer details: Verbal cues for technique, Verbal cues for precautions/safety, Manual facilitation for weight shifting  Function - Locomotion: Wheelchair Will patient use wheelchair at discharge?: Yes(TBD) Type:  Manual Max wheelchair distance: 25' Assist Level: Total assistance (Pt < 25%) Wheel 50 feet with 2 turns activity did not occur: Safety/medical concerns Wheel 150 feet activity did not occur: Safety/medical concerns Turns around,maneuvers to table,bed, and toilet,negotiates 3% grade,maneuvers on rugs and over doorsills: No Function - Locomotion: Ambulation Assistive device: Walker-rolling Max distance: 150' Assist level: Touching or steadying assistance (Pt > 75%) Assist level: Touching or steadying assistance (Pt > 75%) Walk 50 feet with 2 turns activity did not occur: Safety/medical concerns Assist level: Touching or steadying assistance (Pt > 75%) Walk 150 feet activity did not occur: Safety/medical concerns Assist level: Touching or steadying assistance (Pt > 75%) Walk 10 feet on uneven surfaces activity did not occur: Safety/medical concerns  Function - Comprehension Comprehension: Auditory Comprehension assist level: Understands basic 75 - 89% of the time/ requires cueing 10 - 24% of the time  Function - Expression Expression: Verbal, Nonverbal Expression assist level: Expresses basic 25 - 49% of the time/requires cueing 50 - 75% of the time. Uses single words/gestures.  Function - Social Interaction Social Interaction assist level: Interacts appropriately 90% of the time - Needs monitoring or encouragement for participation or interaction.  Function - Problem Solving Problem solving assist level: Solves basic 75 - 89% of  the time/requires cueing 10 - 24% of the time  Function - Memory Memory assist level: Recognizes or recalls 75 - 89% of the time/requires cueing 10 - 24% of the time, Recognizes or recalls 90% of the time/requires cueing < 10% of the time Patient normally able to recall (first 3 days only): That he or she is in a hospital, Staff names and faces, Current season, Location of own room  Medical Problem List and Plan: 1.Right hemiparesis with expressive  aphasiasecondary to left MCA infarction  Cont CIR 2. DVT Prophylaxis/Anticoagulation: SCDs.   Head CT on 6/23 reviewed, showing persistent hemorrhagic transformation. Cont Aspirin, plan for repeat head CT~ 6/30 and it hemorrhage resolved may switch to coumadin per Neuro -discussed and educated family, educated sister again on 6/25, and daughter again on 6/27 3. Pain Management:Hydrocodone as needed 4. Mood:Provide emotional support 5. Neuropsych: This patientis notcapable of making decisions on herown behalf. 6. Skin/Wound Care:Routine skin checks 7. 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 Follow-up cardiology services. Chronic amiodarone on hold as patient's cardiac rate controlled. CHRONIC COUMADINcurrently on hold due to high risk of hemorrhage after recent CVA  9.Chronic diastolic congestive heart failure. Monitor for any signs of fluid overload Filed Weights   02/03/18 0324 02/04/18 0500 02/05/18 0618  Weight: 73.3 kg (161 lb 9.6 oz) 73.3 kg (161 lb 9.6 oz) 70.8 kg (156 lb 1.4 oz)   ? Reliability 10.Hypertension. Patient on amiodarone 200 mg daily, HCTZ 25 mg daily prior to admission. Resume as needed Vitals:   02/04/18 2002 02/05/18 0419  BP: (!) 154/70 (!) 155/77  Pulse: 82 63  Resp: 18 20  Temp: 98.3 F (36.8 C) 98.4 F (36.9 C)  SpO2: 99% 100%   Chlorthalidone 25 started on 6/25, Increased to 50 on 6/27  Remains elevated, will consider further increase tomorrow 11.Hyperlipidemia. Lipitor 12.  Hx Afib but amiodarone held per cardiology due to junctional rythmn, asymptomatic 13. CKD  Creatinine 1.14 on 6/28  Cont to monitor 14. 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 Acute blood loss anemia  Hemoglobin 9.9 on 6/26  Continue to monitor  17. . Acute lower UTI  UA +, U culture remains pending  Empiric Macrobid  started on 6/27 18. Hypokalemia  Potassium 3.3 on 6/20  Supplemented 2 days  LOS (Days) 9 A FACE TO FACE EVALUATION WAS PERFORMED  Julie Paolini Lorie Phenix 02/05/2018, 1:01 PM

## 2018-02-06 ENCOUNTER — Inpatient Hospital Stay (HOSPITAL_COMMUNITY): Payer: Medicare Other | Admitting: Occupational Therapy

## 2018-02-06 ENCOUNTER — Inpatient Hospital Stay (HOSPITAL_COMMUNITY): Payer: Medicare Other

## 2018-02-06 DIAGNOSIS — I4891 Unspecified atrial fibrillation: Secondary | ICD-10-CM

## 2018-02-06 NOTE — Progress Notes (Signed)
Occupational Therapy Session Note  Patient Details  Name: MAEBELL LYVERS MRN: [id number] Date of Birth: 1940/11/28  Today's Date: 02/06/2018 OT Group Time: 1100-1200 OT Group Time Calculation (min): 60 min  Skilled Therapeutic Interventions/Progress Updates:    Pt participated in therapeutic w/c level dance group with focus on UE/LE strengthening, activity tolerance, and social participation for carryover during self care tasks. Pt was guided through various dance-based exercises involving UB/LB and trunk. Modifications made to emphasize praxis and R UE coordination. Family was present, and pt actively participated with them, group members, and therapist when dancing. She mouthed the words to several [place] songs. Improved ability to follow verbal instruction with min manual guidance for initiation/exercise technique (compared to group session last week). She was smiling 90% of the time. At end of session, pts family escorted her back to room.    Therapy Documentation Precautions:  Precautions Precautions: Fall Restrictions Weight Bearing Restrictions: No Pain: Pain Assessment Faces Pain Scale: No hurt ADL: ADL ADL Comments: Please see functional navigator     See Function Navigator for Current Functional Status.   Therapy/Group: Group Therapy  Rayni Nemitz A Wendelyn Kiesling 02/06/2018, 12:41 PM

## 2018-02-06 NOTE — Plan of Care (Signed)
  Problem: Consults Goal: RH STROKE PATIENT EDUCATION Description See Patient Education module for education specifics  Outcome: Progressing   Problem: RH BOWEL ELIMINATION Goal: RH STG MANAGE BOWEL WITH ASSISTANCE Description STG Manage Bowel with moderate Assistance.  Outcome: Progressing Goal: RH STG MANAGE BOWEL W/MEDICATION W/ASSISTANCE Description STG Manage Bowel with Medication and min Assistance.  Outcome: Progressing   Problem: RH BLADDER ELIMINATION Goal: RH STG MANAGE BLADDER WITH ASSISTANCE Description STG Manage Bladder With moderate Assistance  Outcome: Progressing Goal: RH STG MANAGE BLADDER WITH EQUIPMENT WITH ASSISTANCE Description STG Manage Bladder With Equipment With moderate Assistance  Outcome: Progressing   Problem: RH SKIN INTEGRITY Goal: RH STG SKIN FREE OF INFECTION/BREAKDOWN Description Skin to remain free from infection or breakdown while on rehab with min assist  Outcome: Progressing Goal: RH STG MAINTAIN SKIN INTEGRITY WITH ASSISTANCE Description STG Maintain Skin Integrity With min Assistance.  Outcome: Progressing Goal: RH STG ABLE TO PERFORM INCISION/WOUND CARE W/ASSISTANCE Description STG Able To Perform MASD Care With moderate Assistance.  Outcome: Progressing   Problem: RH SAFETY Goal: RH STG ADHERE TO SAFETY PRECAUTIONS W/ASSISTANCE/DEVICE Description STG Adhere to Safety Precautions With moderate Assistance and appropriate assistive Device.  Outcome: Progressing Goal: RH STG DECREASED RISK OF FALL WITH ASSISTANCE Description STG Decreased Risk of Fall With moderate Assistance.  Outcome: Progressing   Problem: RH PAIN MANAGEMENT Goal: RH STG PAIN MANAGED AT OR BELOW PT'S PAIN GOAL Description <3 on a 0-10 pain scale  Outcome: Progressing

## 2018-02-06 NOTE — Progress Notes (Signed)
Subjective/Complaints: Daughter at bedside. Daughter states patient slept well overnight. Daughter does not have any questions today.  Review of systems: Unable to obtain secondary to aphasia.  Objective: Vital Signs: Blood pressure (!) 144/93, pulse 75, temperature 98.8 F (37.1 C), temperature source Oral, resp. rate 18, height _0  (1.6 m), weight 72.4 kg (159 lb 9.8 oz), last menstrual period 11/05/1992, SpO2 100 %. No results found. Results for orders placed or performed during the hospital encounter of 01/27/18 (from the past 72 hour(s))  Urine Culture     Status: Abnormal   Collection Time: 02/04/18  3:05 PM  Result Value Ref Range   Specimen Description URINE, CLEAN CATCH    Special Requests      NONE Performed at [facility], [place] [address] [place], [place] [address]    Culture MULTIPLE SPECIES PRESENT, SUGGEST RECOLLECTION (A)    Report Status 02/05/2018 FINAL   Basic metabolic panel     Status: Abnormal   Collection Time: 02/05/18  5:19 AM  Result Value Ref Range   Sodium 141 135 - 145 mmol/L   Potassium 3.3 (L) 3.5 - 5.1 mmol/L   Chloride 105 98 - 111 mmol/L    Comment: Please note change in reference range.   CO2 26 22 - 32 mmol/L   Glucose, Bld 98 70 - 99 mg/dL    Comment: Please note change in reference range.   BUN 15 8 - 23 mg/dL    Comment: Please note change in reference range.   Creatinine, Ser 1.14 (H) 0.44 - 1.00 mg/dL   Calcium 8.9 8.9 - 10.3 mg/dL   GFR calc non Af Amer 45 (L) >60 mL/min   GFR calc Af Amer 52 (L) >60 mL/min    Comment: (NOTE) The eGFR has been calculated using the CKD EPI equation. This calculation has not been validated in all clinical situations. eGFR's persistently <60 mL/min signify possible Chronic Kidney Disease.    Anion gap 10 5 - 15    Comment: Performed at [facility] [address] [place], [place] [address]     Constitutional: No distress . Vital signs reviewed. HENT: Normocephalic.   Atraumatic. Eyes: EOMI. No discharge. Cardiovascular: irregularly irregular. No JVD. Respiratory: CTA Bilaterally. Normal effort. GI: BS +. Non-distended. Musc: No edema or tenderness in extremities. Neuro: Alert Global aphasia Limited ability to follow commands, ?4/5 throughout (stable) Psych: Unable to assess due to aphasia  Assessment/Plan: 1. 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 Physiatrist is providing close team supervision and 24 hour management of active medical problems listed below. Physiatrist and rehab team continue to assess barriers to discharge/monitor patient progress toward functional and medical goals. FIM: Function - Bathing Position: Shower Body parts bathed by patient: Right arm, Left arm, Chest, Left upper leg, Right upper leg, Front perineal area, Abdomen, Buttocks, Right lower leg, Left lower leg Body parts bathed by helper: Back Assist Level: Touching or steadying assistance(Pt > 75%)  Function- Upper Body Dressing/Undressing What is the patient wearing?: Pull over shirt/dress Pull over shirt/dress - Perfomed by patient: Thread/unthread right sleeve, Thread/unthread left sleeve, Put head through opening, Pull shirt over trunk Pull over shirt/dress - Perfomed by helper: Pull shirt over trunk, Thread/unthread right sleeve Assist Level: Supervision or verbal cues Function - Lower Body Dressing/Undressing What is the patient wearing?: Pants, Socks, Shoes Position: Wheelchair/chair at  sink Pants- Performed by patient: Thread/unthread right pants leg, Thread/unthread left pants leg, Pull pants up/down Pants- Performed by helper: Thread/unthread right pants leg Non-skid slipper socks- Performed by patient: Don/doff left sock Non-skid slipper socks- Performed by helper: Don/doff right sock, Don/doff left sock Socks -  Performed by patient: Don/doff left sock, Don/doff right sock Socks - Performed by helper: Don/doff right sock Shoes - Performed by patient: Don/doff right shoe, Don/doff left shoe, Fasten right, Fasten left Shoes - Performed by helper: Don/doff right shoe Assist for footwear: Supervision/touching assist Assist for lower body dressing: Touching or steadying assistance (Pt > 75%)  Function - Toileting Toileting steps completed by patient: Adjust clothing prior to toileting Toileting steps completed by helper: Performs perineal hygiene, Adjust clothing after toileting Toileting Assistive Devices: Grab bar or rail Assist level: Touching or steadying assistance (Pt.75%)  Function - Air cabin crew transfer activity did not occur: Safety/medical concerns Toilet transfer assistive device: Grab bar Mechanical lift: Stedy Assist level to toilet: Touching or steadying assistance (Pt > 75%) Assist level from toilet: Touching or steadying assistance (Pt > 75%)  Function - Chair/bed transfer Chair/bed transfer method: Stand pivot, Ambulatory Chair/bed transfer assist level: Supervision or verbal cues Chair/bed transfer assistive device: Armrests, Walker Chair/bed transfer details: Verbal cues for technique, Verbal cues for precautions/safety, Manual facilitation for weight shifting  Function - Locomotion: Wheelchair Will patient use wheelchair at discharge?: Yes(TBD) Type: Manual Max wheelchair distance: 25' Assist Level: Total assistance (Pt < 25%) Wheel 50 feet with 2 turns activity did not occur: Safety/medical concerns Wheel 150 feet activity did not occur: Safety/medical concerns Turns around,maneuvers to table,bed, and toilet,negotiates 3% grade,maneuvers on rugs and over doorsills: No Function - Locomotion: Ambulation Assistive device: Walker-rolling Max distance: 150' Assist level: Touching or steadying assistance (Pt > 75%) Assist level: Touching or steadying assistance (Pt  > 75%) Walk 50 feet with 2 turns activity did not occur: Safety/medical concerns Assist level: Touching or steadying assistance (Pt > 75%) Walk 150 feet activity did not occur: Safety/medical concerns Assist level: Touching or steadying assistance (Pt > 75%) Walk 10 feet on uneven surfaces activity did not occur: Safety/medical concerns  Function - Comprehension Comprehension: Auditory Comprehension assist level: Understands basic 75 - 89% of the time/ requires cueing 10 - 24% of the time  Function - Expression Expression: Verbal Expression assist level: Expresses basic 25 - 49% of the time/requires cueing 50 - 75% of the time. Uses single words/gestures.  Function - Social Interaction Social Interaction assist level: Interacts appropriately 90% of the time - Needs monitoring or encouragement for participation or interaction.  Function - Problem Solving Problem solving assist level: Solves basic 75 - 89% of the time/requires cueing 10 - 24% of the time  Function - Memory Memory assist level: Recognizes or recalls 75 - 89% of the time/requires cueing 10 - 24% of the time, Recognizes or recalls 90% of the time/requires cueing < 10% of the time Patient normally able to recall (first 3 days only): That he or she is in a hospital, Staff names and faces, Current season, Location of own room  Medical Problem List and Plan: 1.Right hemiparesis with expressive aphasiasecondary to left MCA infarction  Cont CIR 2. DVT Prophylaxis/Anticoagulation: SCDs.   Head CT on 6/23 reviewed, showing persistent hemorrhagic transformation. Cont Aspirin, plan for repeat head CT tomorrow and if hemorrhage resolved may switch to coumadin per Neuro -discussed and educated family, educated sister again on 6/25, and daughter again on 6/27 3. Pain  Management:Hydrocodone as needed 4. Mood:Provide emotional support 5. Neuropsych: This patientis notcapable of making decisions on herown behalf. 6. Skin/Wound  Care:Routine skin checks 7. 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 Follow-up cardiology services. Chronic amiodarone on hold as patient's cardiac rate controlled. CHRONIC COUMADINcurrently on hold due to high risk of hemorrhage after recent CVA  9.Chronic diastolic congestive heart failure. Monitor for any signs of fluid overload Filed Weights   02/04/18 0500 02/05/18 0618 02/06/18 0559  Weight: 73.3 kg (161 lb 9.6 oz) 70.8 kg (156 lb 1.4 oz) 72.4 kg (159 lb 9.8 oz)   ? reliability 10.Hypertension. Patient on amiodarone 200 mg daily, HCTZ 25 mg daily prior to admission. Resume as needed Vitals:   02/05/18 1458 02/06/18 0541  BP: (!) 152/75 (!) 144/93  Pulse: 78 75  Resp: 18 18  Temp: 98.8 F (37.1 C)   SpO2: 100% 100%   Chlorthalidone 25 started on 6/25, Increased to 50 on 6/27  Remains elevated, but? Improving 11.Hyperlipidemia. Lipitor 12.  Hx Afib but amiodarone held per cardiology due to junctional rythmn, asymptomatic 13. CKD  Creatinine 1.14 on 6/28  Labs ordered for Monday  Cont to monitor 14. 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 Acute blood loss anemia  Hemoglobin 9.9 on 6/26  Continue to monitor  17. Acute lower UTI  UA +, U culture with multiple species  Empiric Macrobid started on 6/27 - 7/4 18. Hypokalemia  Potassium 3.3 on 6/20  Supplemented 2 days  Labs ordered for Monday  LOS (Days) 10 A FACE TO FACE EVALUATION WAS PERFORMED  Ankit Lorie Phenix 02/06/2018, 8:04 AM

## 2018-02-07 ENCOUNTER — Inpatient Hospital Stay (HOSPITAL_COMMUNITY): Payer: Medicare Other

## 2018-02-07 ENCOUNTER — Inpatient Hospital Stay (HOSPITAL_COMMUNITY): Payer: Medicare Other | Admitting: Physical Therapy

## 2018-02-07 DIAGNOSIS — I480 Paroxysmal atrial fibrillation: Secondary | ICD-10-CM

## 2018-02-07 LAB — BASIC METABOLIC PANEL
Anion gap: 8 (ref 5–15)
BUN: 23 mg/dL (ref 8–23)
CO2: 27 mmol/L (ref 22–32)
Calcium: 9 mg/dL (ref 8.9–10.3)
Chloride: 105 mmol/L (ref 98–111)
Creatinine, Ser: 1.27 mg/dL — ABNORMAL HIGH (ref 0.44–1.00)
GFR calc Af Amer: 46 mL/min — ABNORMAL LOW (ref >60)
GFR calc non Af Amer: 40 mL/min — ABNORMAL LOW (ref >60)
Glucose, Bld: 90 mg/dL (ref 70–99)
Potassium: 3.8 mmol/L (ref 3.5–5.1)
Sodium: 140 mmol/L (ref 135–145)

## 2018-02-07 NOTE — Progress Notes (Signed)
Occupational Therapy Session Note  Patient Details  Name: Brooke Chavez MRN: [id number] Date of Birth: [birth year]  Today's Date: 02/07/2018 OT Individual Time: 4628-6381 OT Individual Time Calculation (min): 45 min   Skilled Therapeutic Interventions/Progress Updates:    1:1. Pain was not reported. Pt dons shoes EOB and fastens with increased time. Pt ambulates with RW to bathroom to transfer onto [facility] with 1 LOB to R with MIN A to recover. Pt completes bathing with touching A for standing balance. Pt does not like heel riser in shoe, but willing to ambulate to dayroom with RW at min A level and VC for looking ahead and foot clearance. Pt requires MAX demonstration cues to complete palm<>finger translation of coins, frequently dropping and demo difficulty with small pennies/dimes. Pt completes threading beads with cueing for every bead to pick up with RUE d/t decreased memory/R inattention. Exited session with pt seated in w/c and family present to supervise.   Therapy Documentation Precautions:  Precautions Precautions: Fall Restrictions Weight Bearing Restrictions: No General:   Vital Signs:  Pain: Pain Assessment Pain Score: 0-No pain  See Function Navigator for Current Functional Status.   Therapy/Group: Individual Therapy  Tonny Branch 02/07/2018, 12:45 PM

## 2018-02-07 NOTE — Progress Notes (Signed)
Physical Therapy Session Note  Patient Details  Name: Brooke Chavez MRN: [id number] Date of Birth: [birth year]  Today's Date: 02/07/2018 PT Individual Time: 1035-1120 PT Individual Time Calculation (min): 45 min   Short Term Goals: Week 1:  PT Short Term Goal 1 (Week 1): Pt will transfer bed<>chair w/ mod assist towards R (hemi) side PT Short Term Goal 1 - Progress (Week 1): Met PT Short Term Goal 2 (Week 1): Pt will ambulate 25' w/ min assist w/ LRAD PT Short Term Goal 2 - Progress (Week 1): Met PT Short Term Goal 3 (Week 1): Pt will attend to R environment during functional mobility w/ min cues 50% of the time PT Short Term Goal 3 - Progress (Week 1): Met PT Short Term Goal 4 (Week 1): Pt will self-propel w/c 25' w/ min assist PT Short Term Goal 4 - Progress (Week 1): Discontinued (comment)(Anticipate pt will be primary ambulator at d/c, pt unable to coordinate w/c propulsion 2/2 apraxia)  Skilled Therapeutic Interventions/Progress Updates:  Pt was seen bedside in the am. Pt performed multiple sit to stand and stand pivot transfers with rolling walker and min guard to min A with verbal cues. Pt ambulated 130 and 175 feet with rolling walker and min A with verbal cues. In gym treatment focused on NMR, utilizing step taps and alternating step taps, 3 sets x 10 reps each. Pt requires multiple verbal cues throughout treatment with little carry over noted. Pt returned to room following treatment and left sitting up in w/c with family at bedside.   Therapy Documentation Precautions:  Precautions Precautions: Fall Restrictions Weight Bearing Restrictions: No General:   Pain: Pain Assessment Pain Score: 0-No pain  See Function Navigator for Current Functional Status.   Therapy/Group: Individual Therapy  Dub Amis 02/07/2018, 12:41 PM

## 2018-02-07 NOTE — Progress Notes (Signed)
Subjective/Complaints: Pt seen lying in bed this AM.  No reported issues overnight.  Daughter and son with several questions regarding stroke and anticoagulation.  Review of systems: Unable to obtain secondary to aphasia.  Objective: Vital Signs: Blood pressure (!) 144/89, pulse 75, temperature 98.6 F (37 C), temperature source Oral, resp. rate 19, height '5\' 3"'$  (1.6 m), weight 73.9 kg (162 lb 14.7 oz), last menstrual period 11/05/1992, SpO2 99 %. No results found. Results for orders placed or performed during the hospital encounter of 01/27/18 (from the past 72 hour(s))  Urine Culture     Status: Abnormal   Collection Time: 02/04/18  3:05 PM  Result Value Ref Range   Specimen Description URINE, CLEAN CATCH    Special Requests      NONE Performed at [facility], [place] [address], [place], [place] [address]    Culture MULTIPLE SPECIES PRESENT, SUGGEST RECOLLECTION (A)    Report Status 02/05/2018 FINAL   Basic metabolic panel     Status: Abnormal   Collection Time: 02/05/18  5:19 AM  Result Value Ref Range   Sodium 141 135 - 145 mmol/L   Potassium 3.3 (L) 3.5 - 5.1 mmol/L   Chloride 105 98 - 111 mmol/L    Comment: Please note change in reference range.   CO2 26 22 - 32 mmol/L   Glucose, Bld 98 70 - 99 mg/dL    Comment: Please note change in reference range.   BUN 15 8 - 23 mg/dL    Comment: Please note change in reference range.   Creatinine, Ser 1.14 (H) 0.44 - 1.00 mg/dL   Calcium 8.9 8.9 - 10.3 mg/dL   GFR calc non Af Amer 45 (L) >60 mL/min   GFR calc Af Amer 52 (L) >60 mL/min    Comment: (NOTE) The eGFR has been calculated using the CKD EPI equation. This calculation has not been validated in all clinical situations. eGFR's persistently <60 mL/min signify possible Chronic Kidney Disease.    Anion gap 10 5 - 15    Comment: Performed at [facility] [address] [place], [place] [address]  Basic metabolic panel     Status: Abnormal   Collection Time:  02/07/18  6:26 AM  Result Value Ref Range   Sodium 140 135 - 145 mmol/L   Potassium 3.8 3.5 - 5.1 mmol/L   Chloride 105 98 - 111 mmol/L    Comment: Please note change in reference range.   CO2 27 22 - 32 mmol/L   Glucose, Bld 90 70 - 99 mg/dL    Comment: Please note change in reference range.   BUN 23 8 - 23 mg/dL    Comment: Please note change in reference range.   Creatinine, Ser 1.27 (H) 0.44 - 1.00 mg/dL   Calcium 9.0 8.9 - 10.3 mg/dL   GFR calc non Af Amer 40 (L) >60 mL/min   GFR calc Af Amer 46 (L) >60 mL/min    Comment: (NOTE) The eGFR has been calculated using the CKD EPI equation. This calculation has not been validated in all clinical situations. eGFR's persistently <60 mL/min signify possible Chronic Kidney Disease.    Anion gap 8 5 - 15    Comment: Performed at [facility] [address] [place],  [address]     Constitutional: No distress . Vital signs reviewed. HENT: Normocephalic.  Atraumatic. Eyes: EOMI. No discharge. Cardiovascular: RRR. No JVD. Respiratory: CTA Bilaterally. Normal effort. GI: BS +.  Non-distended. Musc: No edema or tenderness in extremities. Neuro: Alert Global aphasia Limited ability to follow commands, ?4/5 throughout (unchanged) Psych: Unable to assess due to aphasia  Assessment/Plan: 1. 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 Physiatrist is providing close team supervision and 24 hour management of active medical problems listed below. Physiatrist and rehab team continue to assess barriers to discharge/monitor patient progress toward functional and medical goals. FIM: Function - Bathing Position: Shower Body parts bathed by patient: Right arm, Left arm, Chest, Left upper leg, Right upper leg, Front perineal area, Abdomen, Buttocks, Right lower leg, Left lower leg Body parts  bathed by helper: Back Assist Level: Touching or steadying assistance(Pt > 75%)  Function- Upper Body Dressing/Undressing What is the patient wearing?: Pull over shirt/dress Pull over shirt/dress - Perfomed by patient: Thread/unthread right sleeve, Thread/unthread left sleeve, Put head through opening, Pull shirt over trunk Pull over shirt/dress - Perfomed by helper: Pull shirt over trunk, Thread/unthread right sleeve Assist Level: Supervision or verbal cues Function - Lower Body Dressing/Undressing What is the patient wearing?: Pants, Socks, Shoes Position: Wheelchair/chair at sink Pants- Performed by patient: Thread/unthread right pants leg, Thread/unthread left pants leg, Pull pants up/down Pants- Performed by helper: Thread/unthread right pants leg Non-skid slipper socks- Performed by patient: Don/doff left sock Non-skid slipper socks- Performed by helper: Don/doff right sock, Don/doff left sock Socks - Performed by patient: Don/doff left sock, Don/doff right sock Socks - Performed by helper: Don/doff right sock Shoes - Performed by patient: Don/doff right shoe, Don/doff left shoe, Fasten right, Fasten left Shoes - Performed by helper: Don/doff right shoe Assist for footwear: Supervision/touching assist Assist for lower body dressing: Touching or steadying assistance (Pt > 75%)  Function - Toileting Toileting steps completed by patient: Adjust clothing prior to toileting Toileting steps completed by helper: Performs perineal hygiene, Adjust clothing after toileting Toileting Assistive Devices: Grab bar or rail Assist level: More than reasonable time, Touching or steadying assistance (Pt.75%)  Function - Air cabin crew transfer activity did not occur: Safety/medical concerns Toilet transfer assistive device: Grab bar Mechanical lift: Stedy Assist level to toilet: Touching or steadying assistance (Pt > 75%) Assist level from toilet: Touching or steadying assistance (Pt >  75%)  Function - Chair/bed transfer Chair/bed transfer method: Stand pivot, Ambulatory Chair/bed transfer assist level: Supervision or verbal cues Chair/bed transfer assistive device: Armrests Chair/bed transfer details: Verbal cues for technique, Verbal cues for precautions/safety, Manual facilitation for weight shifting  Function - Locomotion: Wheelchair Will patient use wheelchair at discharge?: Yes(TBD) Type: Manual Max wheelchair distance: 25' Assist Level: Total assistance (Pt < 25%) Wheel 50 feet with 2 turns activity did not occur: Safety/medical concerns Wheel 150 feet activity did not occur: Safety/medical concerns Turns around,maneuvers to table,bed, and toilet,negotiates 3% grade,maneuvers on rugs and over doorsills: No Function - Locomotion: Ambulation Assistive device: Walker-rolling Max distance: 150' Assist level: Touching or steadying assistance (Pt > 75%) Assist level: Touching or steadying assistance (Pt > 75%) Walk 50 feet with 2 turns activity did not occur: Safety/medical concerns Assist level: Touching or steadying assistance (Pt > 75%) Walk 150 feet activity did not occur: Safety/medical concerns Assist level: Touching or steadying assistance (Pt > 75%) Walk 10 feet on uneven surfaces activity did not occur: Safety/medical concerns  Function - Comprehension Comprehension: Auditory Comprehension assist level: Understands basic 50 - 74% of the time/ requires cueing 25 -  49% of the time  Function - Expression Expression: Verbal, Nonverbal Expression assist level: Expresses basis less than 25% of the time/requires cueing >75% of the time.  Function - Social Interaction Social Interaction assist level: Interacts appropriately 75 - 89% of the time - Needs redirection for appropriate language or to initiate interaction.  Function - Problem Solving Problem solving assist level: Solves basic 75 - 89% of the time/requires cueing 10 - 24% of the time  Function -  Memory Memory assist level: Recognizes or recalls 75 - 89% of the time/requires cueing 10 - 24% of the time, Recognizes or recalls 90% of the time/requires cueing < 10% of the time Patient normally able to recall (first 3 days only): That he or she is in a hospital, Staff names and faces, Current season, Location of own room  Medical Problem List and Plan: 1.Right hemiparesis with expressive aphasiasecondary to left MCA infarction  Cont CIR 2. DVT Prophylaxis/Anticoagulation: SCDs.   Head CT on 6/23 reviewed, showing persistent hemorrhagic transformation. Cont Aspirin, will order repeat head CT and if hemorrhage resolved may switch to coumadin per Neuro  Discussed and educated family, educated sister again on 6/25, and daughter again on 6/27, significant time spent again with son and daughter on 6/30 - they would like to speak with Cardiology and Neurology. 3. Pain Management:Hydrocodone as needed 4. Mood:Provide emotional support 5. Neuropsych: This patientis notcapable of making decisions on herown behalf. 6. Skin/Wound Care:Routine skin checks 7. 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 Follow-up cardiology services. Chronic amiodarone on hold as patient's cardiac rate controlled. CHRONIC COUMADINcurrently on hold due to high risk of hemorrhage after recent CVA  9.Chronic diastolic congestive heart failure. Monitor for any signs of fluid overload Filed Weights   02/05/18 0618 02/06/18 0559 02/07/18 0500  Weight: 70.8 kg (156 lb 1.4 oz) 72.4 kg (159 lb 9.8 oz) 73.9 kg (162 lb 14.7 oz)   ?Reliability 10.Hypertension. Patient on amiodarone 200 mg daily, HCTZ 25 mg daily prior to admission. Resume as needed Vitals:   02/06/18 2058 02/07/18 0530  BP: 124/63 (!) 144/89  Pulse: 62 75  Resp: 16 19  Temp: 98.8 F (37.1 C) 98.6 F (37 C)  SpO2: 98% 99%   Chlorthalidone 25 started on 6/25, Increased to 50 on  6/27  Slightly elevated on 6/30, but overall controlled 11.Hyperlipidemia. Lipitor 12.  Hx Afib but amiodarone held per cardiology due to junctional rythmn, asymptomatic 13. CKD  Creatinine 1.27 on 6/30  Encourage fluids  Cont to monitor 14. 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 Acute blood loss anemia  Hemoglobin 9.9 on 6/26  Continue to monitor  17. Acute lower UTI  UA +, U culture with multiple species  Empiric Macrobid started on 6/27 - 7/4 18. Hypokalemia  Potassium 3.8 on 6/30  Supplemented 2 days  LOS (Days) 11 A FACE TO FACE EVALUATION WAS PERFORMED  Kenshin Splawn Lorie Phenix 02/07/2018, 12:08 PM

## 2018-02-08 ENCOUNTER — Inpatient Hospital Stay (HOSPITAL_COMMUNITY): Payer: Medicare Other | Admitting: Occupational Therapy

## 2018-02-08 ENCOUNTER — Inpatient Hospital Stay (HOSPITAL_COMMUNITY): Payer: Medicare Other

## 2018-02-08 ENCOUNTER — Inpatient Hospital Stay (HOSPITAL_COMMUNITY): Payer: Medicare Other | Admitting: Speech Pathology

## 2018-02-08 DIAGNOSIS — I482 Chronic atrial fibrillation: Secondary | ICD-10-CM

## 2018-02-08 MED ORDER — POTASSIUM CHLORIDE CRYS ER 10 MEQ PO TBCR
10.0000 meq | EXTENDED_RELEASE_TABLET | Freq: Every day | ORAL | Status: DC
  Administered 2018-02-08 – 2018-02-13 (×6): 10 meq via ORAL
  Filled 2018-02-08 (×5): qty 1

## 2018-02-08 NOTE — Progress Notes (Signed)
Speech Language Pathology Daily Session Note  Patient Details  Name: Brooke Chavez MRN: [id number] Date of Birth: June 19, 1941  Today's Date: 02/08/2018 SLP Individual Time: 1000-1030 SLP Individual Time Calculation (min): 30 min  Short Term Goals: Week 2: SLP Short Term Goal 1 (Week 2): Using melodic carrier phrase, pt will state object name in 3 of 10 opportunities with Max A cues.  SLP Short Term Goal 2 (Week 2): Using melodic intonation, pt will produce common phrases with Max A cues in 5 out of 10 opportunities.  SLP Short Term Goal 3 (Week 2): Pt will follow 1 step directions with Mod A cues to achieve ~ 80% accuracy.  SLP Short Term Goal 4 (Week 2): Pt will scan to right of midline in 5 of 10 opportunities with Max A cues.  SLP Short Term Goal 5 (Week 2): Pt will consume regular textures with Min A cues for use of compensatory swallow strategies for complete oral clearing to demonstrate readiness for diet upgrade.   Skilled Therapeutic Interventions: Skilled treatment session focused on communication goals. SLP facilitated session by providing Max A multimodal cues centered around melodic intonation to produce common social phrases and to name objects (3 objects paired with different melodic phrases). SLP able to fade verbal model with social phrases but not able to fade verbal model to name objects. Pt with progress in using spontaneous language. Pt was left upright in wheelchair, safety belt donned and sister present. All needs were left within reach. Continue per current plan of care.      Function:    Cognition Comprehension Comprehension assist level: Understands basic 50 - 74% of the time/ requires cueing 25 - 49% of the time  Expression   Expression assist level: Expresses basis less than 25% of the time/requires cueing >75% of the time.;Expresses basic 25 - 49% of the time/requires cueing 50 - 75% of the time. Uses single words/gestures.  Social Interaction Social Interaction  assist level: Interacts appropriately 75 - 89% of the time - Needs redirection for appropriate language or to initiate interaction.  Problem Solving Problem solving assist level: Solves basic 75 - 89% of the time/requires cueing 10 - 24% of the time  Memory Memory assist level: Recognizes or recalls 75 - 89% of the time/requires cueing 10 - 24% of the time    Pain Pain Assessment Pain Scale: Faces Faces Pain Scale: No hurt  Therapy/Group: Individual Therapy  Cevin Rubinstein 02/08/2018, 11:52 AM

## 2018-02-08 NOTE — Progress Notes (Signed)
Occupational Therapy Session Note  Patient Details  Name: Brooke Chavez MRN: [id number] Date of Birth: 04/28/1941  Today's Date: 02/08/2018 OT Individual Time: 3335-4562 OT Individual Time Calculation (min): 75 min    Short Term Goals: Week 2:  OT Short Term Goal 1 (Week 2): STG=LTG 2/2 ELOS  Skilled Therapeutic Interventions/Progress Updates:    Pt seen for OT ADL bathing/dressing session. Pt sitting upright in bed with family present supervising breakfast. Pt abreeable to tx session. She transferred to EOB with superviison using hospital bed functions, consumed remainder of breakfast seated EOB. While pt  Ate breakfast, discussed with pt's sister and daughter plans for d/c, DME, scheduling of family ed session, pt's CLOF and assist level. Plans for family ed later in the week in prep for d/c home Saturday. Pt ambulated throuhgout session using RW, mod A initially due to poor R LE foot clearance and uspport, 2 LOB episodes to the R requiring max A to prevent fall. She completed toileting task with steadying assist. She transferred to [facility] in shower and bathed from seated position. Required mod steadying assist to standing during pericare/buttock hygiene, standing without UE. Pt cont to display impulsive behaviors and poor awareness of deficits/ safety awareness and requiring cues and assist for balance. She dressed in w/c, able to correctly orient all clothing when presented with folded clothes. Grooming tasks completed standing at sink, assist for blocking of R knee, pt with limited WBing through R LE, proprioceptive input provided for increased sensation/ attention. Attempted to have pt propel w/c in hallway using hemi technique, however, pt with difficulty motor planning and sequencing movements despite multimodal cuing.  Pt taken remainder of way to [facility] gym total A. Completed Dyn[employer] activity from w/c level focusing on R attention. Pt able to locate all targets R of midline with slightly  increased time. Required min A when using R UE to hit targets due to apraxia/ ataxia.  Pt returned to room at end of session, left seated in w/c with all needs in reach and sister present.   Pt visibly tearful throughout session with increased frustration when unable to verbally communicate at functional level with therapist, able to answer yes/no questions with increased time, however, difficulty communicating beyond one word answers. Empathetic and emotional support provided.  Throughout session, pt required multimodal cuing for hand placement during sit>stand, little carry over of education btwn transfers.   Therapy Documentation Precautions:  Precautions Precautions: Fall Restrictions Weight Bearing Restrictions: No Pain:   No/denies pain ADL: ADL ADL Comments: Please see functional navigator  See Function Navigator for Current Functional Status.   Therapy/Group: Individual Therapy  [name] L 02/08/2018, 7:06 AM

## 2018-02-08 NOTE — Progress Notes (Signed)
Physical Therapy Session Note  Patient Details  Name: Brooke Chavez MRN: [id number] Date of Birth: [birth year]  Today's Date: 02/08/2018 PT Individual Time: 1300-1400 PT Individual Time Calculation (min): 60 min  and Today's Date: 02/08/2018 PT Missed Time: 15 Minutes Missed Time Reason: Other (Comment)(late lunch)  Short Term Goals: Week 2:  PT Short Term Goal 1 (Week 2): =LTGs due to ELOS  Skilled Therapeutic Interventions/Progress Updates:    Pt seated in w/c upon PT arrival, agreeable to therapy tx and denies pain. Pt transported to the gym. Pt ambulated x 80 ft with RW and min assist, continues to demonstrate poor R foot clearance and shortened step length. Pt worked on R hip flexion and step length in order to kick ball back and forth x 20. Pt performed 2 x 5 sit<>stands with 2 inch step under L foot for increased R LE strengthening and neuro re-ed. Pt worked on dynamic standing balance to toss horsehoes while standing on airex without UE support, x 2 trials with min assist. Pt ambulated x 80 ft with RW min assist and x 80 ft with RW and trial of R toe up brace, pt with improved heel strike however continues to demonstrate occasional toe drag secondary to impaired hip flexion. Pt transported back to room at end of session, pt missed 15 minutes of therapy this session secondary to lunch being late. Pt left in care of family, needs in reach and chair alarm set.   Therapy Documentation Precautions:  Precautions Precautions: Fall Restrictions Weight Bearing Restrictions: No   See Function Navigator for Current Functional Status.   Therapy/Group: Individual Therapy  Netta Corrigan, PT, DPT 02/08/2018, 7:42 AM

## 2018-02-08 NOTE — Progress Notes (Signed)
Occupational Therapy Session Note  Patient Details  Name: Brooke Chavez MRN: [id number] Date of Birth: 03-Aug-1941  Today's Date: 02/08/2018 OT Individual Time: 0930-1000 OT Individual Time Calculation (min): 30 min    Short Term Goals: Week 2:  OT Short Term Goal 1 (Week 2): STG=LTG 2/2 ELOS  Skilled Therapeutic Interventions/Progress Updates:    Pt received sitting up in w/c agreeable to therapy via nodding with no c/o pain. Session focused on bilateral integration to promote R UE NMR, as well as ADL transfers. Pt was transported to therapy gym for time management. Min A provided during stand pivot transfer to mat. Pt demo poor carryover of transfer techniques, I.e UE placement during sit <> stand. Pt practiced these transfers x8, with correct UE placement performed only when RW is out of line of sight, and pt unable to follow directions to push up from mat/reach back for mat when RW is anterior. Pt completed [facility] task with simulated pills and pill bottles, transferring small beads from R to L hand with 90% accuracy and opening/closing pill bottles. Pt was returned to her room and left sitting up with QRB donned and family present.   Therapy Documentation Precautions:  Precautions Precautions: Fall Restrictions Weight Bearing Restrictions: No   Pain: Pain Assessment Pain Scale: Faces Faces Pain Scale: No hurt ADL: ADL ADL Comments: Please see functional navigator  See Function Navigator for Current Functional Status.   Therapy/Group: Individual Therapy  Curtis Sites 02/08/2018, 11:25 AM

## 2018-02-08 NOTE — Progress Notes (Signed)
Subjective/Complaints:  Per sister, pt occ states her name  Review of systems: Unable to obtain secondary to aphasia.  Objective: Vital Signs: Blood pressure 138/74, pulse 64, temperature 97.7 F (36.5 C), temperature source Oral, resp. rate 17, height 5' 3" (1.6 m), weight 74.1 kg (163 lb 5.8 oz), last menstrual period 11/05/1992, SpO2 100 %. Ct Head Wo Contrast  Result Date: 02/07/2018 CLINICAL DATA:  77 y/o  F; stroke for follow-up. EXAM: CT HEAD WITHOUT CONTRAST TECHNIQUE: Contiguous axial images were obtained from the base of the skull through the vertex without intravenous contrast. COMPARISON:  01/31/2018 CT head and 01/25/2018 MRI head. FINDINGS: Brain: Stable distribution of left MCA infarct with increased perisylvian cortical density, likely developing laminar necrosis. Interval partial dispersion of left basal ganglia hematoma. Decreased edema and mass effect with improved patency of the left lateral ventricle and resolution of midline shift. No new acute stroke, hemorrhage, or focal mass effect identified. Stable background of chronic microvascular ischemic changes and parenchymal volume loss of the brain. Stable chronic infarctions within the left superior cerebellum, right caudate body, and right caudate head. Vascular: Calcific atherosclerosis of carotid siphons. Skull: Normal. Negative for fracture or focal lesion. Sinuses/Orbits: No acute finding. Other: None. IMPRESSION: 1. Stable distribution of left MCA infarct given differences in technique. Increased density of left perisylvian cortex, likely developing laminar necrosis. 2. Partial dispersion of left basal ganglia hematoma, decreased edema, and decreased mass effect. 3. No new acute intracranial abnormality identified. Electronically Signed   By: Kristine Garbe M.D.   On: 02/07/2018 14:13   Results for orders placed or performed during the hospital encounter of 01/27/18 (from the past 72 hour(s))  Basic metabolic panel      Status: Abnormal   Collection Time: 02/07/18  6:26 AM  Result Value Ref Range   Sodium 140 135 - 145 mmol/L   Potassium 3.8 3.5 - 5.1 mmol/L   Chloride 105 98 - 111 mmol/L    Comment: Please note change in reference range.   CO2 27 22 - 32 mmol/L   Glucose, Bld 90 70 - 99 mg/dL    Comment: Please note change in reference range.   BUN 23 8 - 23 mg/dL    Comment: Please note change in reference range.   Creatinine, Ser 1.27 (H) 0.44 - 1.00 mg/dL   Calcium 9.0 8.9 - 10.3 mg/dL   GFR calc non Af Amer 40 (L) >60 mL/min   GFR calc Af Amer 46 (L) >60 mL/min    Comment: (NOTE) The eGFR has been calculated using the CKD EPI equation. This calculation has not been validated in all clinical situations. eGFR's persistently <60 mL/min signify possible Chronic Kidney Disease.    Anion gap 8 5 - 15    Comment: Performed at [facility] [address] [place], [place] [address]     Constitutional: No distress . Vital signs reviewed. HENT: Normocephalic.  Atraumatic. Eyes: EOMI. No discharge. Cardiovascular: RRR. No JVD. Respiratory: CTA Bilaterally. Normal effort. GI: BS +. Non-distended. Musc: No edema or tenderness in extremities. Neuro: Alert Global aphasia Limited ability to follow commands, ?4/5 throughout (unchanged) Psych: Unable to assess due to aphasia  Assessment/Plan: 1. 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 Physiatrist is providing close team supervision and 24 hour management of active medical problems listed below. Physiatrist and rehab team continue to assess barriers  to discharge/monitor patient progress toward functional and medical goals. FIM: Function - Bathing Position: Shower Body parts bathed by patient: Right arm, Left arm, Chest, Left upper leg, Right upper leg, Front perineal area, Abdomen, Buttocks,  Right lower leg, Left lower leg Body parts bathed by helper: Back Assist Level: Touching or steadying assistance(Pt > 75%)  Function- Upper Body Dressing/Undressing What is the patient wearing?: Pull over shirt/dress Pull over shirt/dress - Perfomed by patient: Thread/unthread right sleeve, Thread/unthread left sleeve, Put head through opening, Pull shirt over trunk Pull over shirt/dress - Perfomed by helper: Pull shirt over trunk, Thread/unthread right sleeve Assist Level: Supervision or verbal cues Function - Lower Body Dressing/Undressing What is the patient wearing?: Pants, Socks, Shoes Position: Wheelchair/chair at sink Pants- Performed by patient: Thread/unthread right pants leg, Thread/unthread left pants leg, Pull pants up/down Pants- Performed by helper: Thread/unthread right pants leg Non-skid slipper socks- Performed by patient: Don/doff left sock Non-skid slipper socks- Performed by helper: Don/doff right sock, Don/doff left sock Socks - Performed by patient: Don/doff left sock, Don/doff right sock Socks - Performed by helper: Don/doff right sock Shoes - Performed by patient: Don/doff right shoe, Don/doff left shoe, Fasten right, Fasten left Shoes - Performed by helper: Don/doff right shoe Assist for footwear: Supervision/touching assist Assist for lower body dressing: Touching or steadying assistance (Pt > 75%)  Function - Toileting Toileting steps completed by patient: Adjust clothing prior to toileting Toileting steps completed by helper: Performs perineal hygiene, Adjust clothing after toileting Toileting Assistive Devices: Grab bar or rail Assist level: Touching or steadying assistance (Pt.75%)  Function - Air cabin crew transfer activity did not occur: Safety/medical concerns Toilet transfer assistive device: Radio broadcast assistant lift: Stedy Assist level to toilet: Touching or steadying assistance (Pt > 75%) Assist level from toilet: Touching or steadying  assistance (Pt > 75%)  Function - Chair/bed transfer Chair/bed transfer method: Stand pivot, Ambulatory Chair/bed transfer assist level: Touching or steadying assistance (Pt > 75%) Chair/bed transfer assistive device: Armrests Chair/bed transfer details: Verbal cues for technique, Verbal cues for precautions/safety, Manual facilitation for weight shifting  Function - Locomotion: Wheelchair Will patient use wheelchair at discharge?: Yes(TBD) Type: Manual Max wheelchair distance: 25' Assist Level: Total assistance (Pt < 25%) Wheel 50 feet with 2 turns activity did not occur: Safety/medical concerns Wheel 150 feet activity did not occur: Safety/medical concerns Turns around,maneuvers to table,bed, and toilet,negotiates 3% grade,maneuvers on rugs and over doorsills: No Function - Locomotion: Ambulation Assistive device: Walker-rolling Max distance: 175 Assist level: Touching or steadying assistance (Pt > 75%) Assist level: Touching or steadying assistance (Pt > 75%) Walk 50 feet with 2 turns activity did not occur: Safety/medical concerns Assist level: Touching or steadying assistance (Pt > 75%) Walk 150 feet activity did not occur: Safety/medical concerns Assist level: Touching or steadying assistance (Pt > 75%) Walk 10 feet on uneven surfaces activity did not occur: Safety/medical concerns  Function - Comprehension Comprehension: Auditory Comprehension assist level: Understands basic 50 - 74% of the time/ requires cueing 25 - 49% of the time  Function - Expression Expression: Verbal, Nonverbal Expression assist level: Expresses basis less than 25% of the time/requires cueing >75% of the time.  Function - Social Interaction Social Interaction assist level: Interacts appropriately 75 - 89% of the time - Needs redirection for appropriate language or to initiate interaction.  Function - Problem Solving Problem solving assist level: Solves basic 75 - 89% of the time/requires cueing 10  - 24% of the time  Function -  Memory Memory assist level: Recognizes or recalls 75 - 89% of the time/requires cueing 10 - 24% of the time Patient normally able to recall (first 3 days only): That he or she is in a hospital, Staff names and faces, Current season, Location of own room  Medical Problem List and Plan: 1.Right hemiparesis with expressive aphasiasecondary to left MCA infarction  Cont CIR 2. DVT Prophylaxis/Anticoagulation: SCDs.   Head CT on 6/23 reviewed, showing persistent hemorrhagic transformation. Cont Aspirin, reviewed repeat head CT  Discussed that while hemorrhge has improved , Neuro will need to review prior to further decisions regarding timing of anticoagulation3. Pain Management:Hydrocodone as needed 4. Mood:Provide emotional support 5. Neuropsych: This patientis notcapable of making decisions on herown behalf. 6. Skin/Wound Care:Routine skin checks 7. 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 Follow-up cardiology services. Chronic amiodarone on hold as patient's cardiac rate controlled. CHRONIC COUMADINcurrently on hold due to high risk of hemorrhage after recent CVA  9.Chronic diastolic congestive heart failure. Monitor for any signs of fluid overload Filed Weights   02/06/18 0559 02/07/18 0500 02/08/18 0545  Weight: 72.4 kg (159 lb 9.8 oz) 73.9 kg (162 lb 14.7 oz) 74.1 kg (163 lb 5.8 oz)   No clinical signs of overload 7/1 10.Hypertension. Patient on amiodarone 200 mg daily, HCTZ 25 mg daily prior to admission. Resume as needed Vitals:   02/07/18 2006 02/08/18 0545  BP: (!) 156/82 138/74  Pulse: 65 64  Resp: 17 17  Temp: 98.1 F (36.7 C) 97.7 F (36.5 C)  SpO2: 100% 100%   Controlled 7/1  Chlorthalidone 25 started on 6/25, Increased to 50 on 6/27  good range need ongoing K supplements 11.Hyperlipidemia. Lipitor 12.  Hx Afib but amiodarone held per cardiology due to  junctional rythmn, asymptomatic 13. CKD 3a  Creatinine 1.27 on 6/30  Encourage fluids  Cont to monitor 14. 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 Acute blood loss anemia  Hemoglobin 9.9 on 6/26  Continue to monitor  17. Acute lower UTI  UA +, U culture with multiple species  Empiric Macrobid started on 6/27 - 7/4 18. Hypokalemia  Potassium 3.8 on 6/30  Supplemented 2 days  LOS (Days) 12 A FACE TO FACE EVALUATION WAS PERFORMED  Charlett Blake 02/08/2018, 7:43 AM

## 2018-02-09 ENCOUNTER — Inpatient Hospital Stay (HOSPITAL_COMMUNITY): Payer: Medicare Other

## 2018-02-09 ENCOUNTER — Inpatient Hospital Stay (HOSPITAL_COMMUNITY): Payer: Medicare Other | Admitting: Physical Therapy

## 2018-02-09 ENCOUNTER — Inpatient Hospital Stay (HOSPITAL_COMMUNITY): Payer: Medicare Other | Admitting: Occupational Therapy

## 2018-02-09 ENCOUNTER — Ambulatory Visit: Payer: Medicare Other | Admitting: Nurse Practitioner

## 2018-02-09 MED ORDER — SALINE SPRAY 0.65 % NA SOLN
1.0000 | NASAL | Status: DC | PRN
  Administered 2018-02-09: 1 via NASAL
  Filled 2018-02-09: qty 44

## 2018-02-09 MED ORDER — APIXABAN 5 MG PO TABS
5.0000 mg | ORAL_TABLET | Freq: Two times a day (BID) | ORAL | Status: DC
  Administered 2018-02-09 – 2018-02-13 (×9): 5 mg via ORAL
  Filled 2018-02-09 (×9): qty 1

## 2018-02-09 NOTE — Discharge Instructions (Addendum)
Inpatient Rehab Discharge Instructions  Brooke Chavez Discharge date and time: No discharge date for patient encounter.   Activities/Precautions/ Functional Status: Activity: activity as tolerated Diet: soft Wound Care: none needed Functional status:  ___ No restrictions     ___ Walk up steps independently ___ 24/7 supervision/assistance   ___ Walk up steps with assistance ___ Intermittent supervision/assistance  ___ Bathe/dress independently ___ Walk with walker     _x STROKE/TIA DISCHARGE INSTRUCTIONS SMOKING Cigarette smoking nearly doubles your risk of having a stroke & is the single most alterable risk factor  If you smoke or have smoked in the last 12 months, you are advised to quit smoking for your health.  Most of the excess cardiovascular risk related to smoking disappears within a year of stopping.  Ask you doctor about anti-smoking medications  [place] Quit Line: 1-800-QUIT NOW  Free Smoking Cessation Classes [phone]  CHOLESTEROL Know your levels; limit fat & cholesterol in your diet  Lipid Panel     Component Value Date/Time   CHOL 140 01/25/2018 0618   CHOL 200 (H) 11/03/2017 1507   TRIG 89 01/25/2018 0618   TRIG 58 09/19/2014 0938   HDL 47 01/25/2018 0618   HDL 63 11/03/2017 1507   HDL 67 09/19/2014 0938   CHOLHDL 3.0 01/25/2018 0618   VLDL 18 01/25/2018 0618   LDLCALC 75 01/25/2018 0618   LDLCALC 106 (H) 11/03/2017 1507   LDLCALC 83 11/21/2013 0830      Many patients benefit from treatment even if their cholesterol is at goal.  Goal: Total Cholesterol (CHOL) less than 160  Goal:  Triglycerides (TRIG) less than 150  Goal:  HDL greater than 40  Goal:  LDL (LDLCALC) less than 100   BLOOD PRESSURE American Stroke Association blood pressure target is less that 120/80 mm/Hg  Your discharge blood pressure is:  BP: (!) 164/69(RN notified)  Monitor your blood pressure  Limit your salt and alcohol intake  Many individuals will require more than one  medication for high blood pressure  DIABETES (A1c is a blood sugar average for last 3 months) Goal HGBA1c is under 7% (HBGA1c is blood sugar average for last 3 months)  Diabetes: No known diagnosis of diabetes    Lab Results  Component Value Date   HGBA1C 5.4 01/25/2018     Your HGBA1c can be lowered with medications, healthy diet, and exercise.  Check your blood sugar as directed by your physician  Call your physician if you experience unexplained or low blood sugars.  PHYSICAL ACTIVITY/REHABILITATION Goal is 30 minutes at least 4 days per week  Activity: Increase activity slowly, Therapies: Physical Therapy: Home Health Return to work:   Activity decreases your risk of heart attack and stroke and makes your heart stronger.  It helps control your weight and blood pressure; helps you relax and can improve your mood.  Participate in a regular exercise program.  Talk with your doctor about the best form of exercise for you (dancing, walking, swimming, cycling).  DIET/WEIGHT Goal is to maintain a healthy weight  Your discharge diet is:  Diet Order           DIET DYS 3 Room service appropriate? Yes; Fluid consistency: Thin  Diet effective now          liquids Your height is:  Height: 5\' 3"  (160 cm) Your current weight is: Weight: 75.3 kg (166 lb 1.7 oz) Your Body Mass Index (BMI) is:  BMI (Calculated): 29.43  Following  the type of diet specifically designed for you will help prevent another stroke.  Your goal weight range is:    Your goal Body Mass Index (BMI) is 19-24.  Healthy food habits can help reduce 3 risk factors for stroke:  High cholesterol, hypertension, and excess weight.  RESOURCES Stroke/Support Group:  Call [phone]   STROKE EDUCATION PROVIDED/REVIEWED AND GIVEN TO PATIENT Stroke warning signs and symptoms How to activate emergency medical system (call 911). Medications prescribed at discharge. Need for follow-up after discharge. Personal risk factors  for stroke. Pneumonia vaccine given:  Flu vaccine given:  My questions have been answered, the writing is legible, and I understand these instructions.  I will adhere to these goals & educational materials that have been provided to me after my discharge from the hospital.   __ Bathe/dress with assistance ___ Walk Independently    ___ Shower independently ___ Walk with assistance    ___ Shower with assistance ___ No alcohol     ___ Return to work/school ________  Special Instructions:    COMMUNITY REFERRALS UPON DISCHARGE:    Home Health:   PT, OT, Privateer   Date of last service:02/13/2018  Medical Equipment/Items [place]    GENERAL COMMUNITY RESOURCES FOR PATIENT/FAMILY: Support Groups:[facility] SUPPORT GROUP EVERY SECOND Thursday @ 3:00-4:00 PM (SEPT-MAY) ON THE REHAB UNIT QUESTIONS CONTACT [facility] [phone]   My questions have been answered and I understand these instructions. I will adhere to these goals and the provided educational materials after my discharge from the hospital.  Patient/Caregiver Signature _______________________________ Date __________  Clinician Signature _______________________________________ Date __________  Please bring this form and your medication list with you to all your follow-up doctor's appointments.    Information on my medicine - ELIQUIS (apixaban)  This medication education was reviewed with me or my healthcare representative as part of my discharge preparation.    Why was Eliquis prescribed for you? Eliquis was prescribed for you to reduce the risk of a blood clot forming that can cause a stroke if you have a medical condition called atrial fibrillation (a type of irregular heartbeat).  What do You need to know about Eliquis ? Take your Eliquis TWICE DAILY - one tablet in the morning and one tablet in the evening with or  without food. If you have difficulty swallowing the tablet whole please discuss with your pharmacist how to take the medication safely.  Take Eliquis exactly as prescribed by your doctor and DO NOT stop taking Eliquis without talking to the doctor who prescribed the medication.  Stopping may increase your risk of developing a stroke.  Refill your prescription before you run out.  After discharge, you should have regular check-up appointments with your healthcare provider that is prescribing your Eliquis.  In the future your dose may need to be changed if your kidney function or weight changes by a significant amount or as you get older.  What do you do if you miss a dose? If you miss a dose, take it as soon as you remember on the same day and resume taking twice daily.  Do not take more than one dose of ELIQUIS at the same time to make up a missed dose.  Important Safety Information A possible side effect of Eliquis is bleeding. You should call your healthcare provider right away if you experience any of the following: ? Bleeding from an injury or your nose that  does not stop. ? Unusual colored urine (red or dark brown) or unusual colored stools (red or black). ? Unusual bruising for unknown reasons. ? A serious fall or if you hit your head (even if there is no bleeding).  Some medicines may interact with Eliquis and might increase your risk of bleeding or clotting while on Eliquis. To help avoid this, consult your healthcare provider or pharmacist prior to using any new prescription or non-prescription medications, including herbals, vitamins, non-steroidal anti-inflammatory drugs (NSAIDs) and supplements.  This website has more information on Eliquis (apixaban): http://www.eliquis.com/eliquis/home

## 2018-02-09 NOTE — Plan of Care (Signed)
  Problem: RH SAFETY Goal: RH STG ADHERE TO SAFETY PRECAUTIONS W/ASSISTANCE/DEVICE Description STG Adhere to Safety Precautions With moderate Assistance and appropriate assistive Device.  Outcome: Progressing   Problem: RH SAFETY Goal: RH STG DECREASED RISK OF FALL WITH ASSISTANCE Description STG Decreased Risk of Fall With moderate Assistance.  Outcome: Progressing  Call light at hand, bed alarm

## 2018-02-09 NOTE — Progress Notes (Signed)
Physical Therapy Session Note  Patient Details  Name: Brooke Chavez MRN: [id number] Date of Birth: 05-May-1941  Today's Date: 02/09/2018 PT Individual Time: 0034-9179 and 1550-1605 PT Individual Time Calculation (min): 10 min and 15 min and Today's Date: 02/09/2018 PT Missed Time: 20 Minutes and 45 minutes Missed Time Reason: Patient ill (Comment)(episode of nausea/vomitting)  Short Term Goals: Week 2:  PT Short Term Goal 1 (Week 2): =LTGs due to ELOS  Skilled Therapeutic Interventions/Progress Updates:    Session 1: Pt supine in bed upon PT arrival. Pt and family report that she is not feeling well, just had an episode of nausea/vommitting after eating cake at lunch. Therapist discussed d/c planning including equipment recommendations and follow up therapy. Pt declines OOB activity at this time secondary to not feeling well, will follow up this afternoon. Pt missed 20 minutes of skilled therapy tx this session.   Session 2: Pt seated in w/c upon PT arrival, reports having a headache now and would like to get back in bed. Pt unable to rate pain, but closes eyes and holds her hand on her head. Therapist assisted pt back to bed with RW and min assist. Pt transferred sitting>supine with min assist. Pt performed bridging to scoot up in bed with min assist. Therapist provided pt with wet washcloth for forehead and notified RN. Blood pressure monitored 139/72.  Pt missed 45 minutes of skilled therapy tx this session.    Therapy Documentation Precautions:  Precautions Precautions: Fall Restrictions Weight Bearing Restrictions: No   See Function Navigator for Current Functional Status.   Therapy/Group: Individual Therapy  Netta Corrigan, PT, DPT 02/09/2018, 8:03 AM

## 2018-02-09 NOTE — Progress Notes (Signed)
Physical Therapy Session Note  Patient Details  Name: Brooke Chavez MRN: [id number] Date of Birth: Jul 15, 1941  Today's Date: 02/09/2018 PT Individual Time: 1515-1540 PT Individual Time Calculation (min): 25 min makeup time  Short Term Goals: Week 2:  PT Short Term Goal 1 (Week 2): =LTGs due to ELOS  Skilled Therapeutic Interventions/Progress Updates:    no indication of pain at start of session, but by end of session pt holding head constantly and nodding yes when asked if her head hurt. RN aware.  Session focus on safety awareness and postural control with functional mobility and toileting.  Pt requires supervision for bed mobility with HOB elevated and using bed rails.  Sit<>stand from EOB with supervision and mod cues for hand placement and forward weight shift.  Ambulation within room with initially min fade to mod assist with increasing distance 2/2 decreased RLE extension and increased R lean in stance phase.  Toileting with assist for clothing management and balance in standing.  Pt returned to w/c at end of session, chair alarm intact, call bell in reach and family present at bedside.   Therapy Documentation Precautions:  Precautions Precautions: Fall Restrictions Weight Bearing Restrictions: No   See Function Navigator for Current Functional Status.   Therapy/Group: Individual Therapy  Michel Santee 02/09/2018, 3:42 PM

## 2018-02-09 NOTE — Progress Notes (Signed)
Speech Language Pathology Daily Session Note  Patient Details  Name: Brooke Chavez MRN: [id number] Date of Birth: 27-Jun-1941  Today's Date: 02/09/2018 SLP Individual Time: 1130-1200 SLP Individual Time Calculation (min): 30 min  Short Term Goals: Week 2: SLP Short Term Goal 1 (Week 2): Using melodic carrier phrase, pt will state object name in 3 of 10 opportunities with Max A cues.  SLP Short Term Goal 2 (Week 2): Using melodic intonation, pt will produce common phrases with Max A cues in 5 out of 10 opportunities.  SLP Short Term Goal 3 (Week 2): Pt will follow 1 step directions with Mod A cues to achieve ~ 80% accuracy.  SLP Short Term Goal 4 (Week 2): Pt will scan to right of midline in 5 of 10 opportunities with Max A cues.  SLP Short Term Goal 5 (Week 2): Pt will consume regular textures with Min A cues for use of compensatory swallow strategies for complete oral clearing to demonstrate readiness for diet upgrade.   Skilled Therapeutic Interventions:Skilled ST services focused on speech skills. Pt was asleep upon entering room with sister present, SLP returned 30 minutes later to allow pt to sleep, due to headache, per pt's sister. SLP facilitated production of common phrase and naming common objects (bal,pen) with carrier phrase utilizing melodic intonation, pt required max A verbal cues and demonstrated ability to fade model in both types of tasks with 70% accuracy. Pt was left in room with call bell within reach and bed alaram set.ST reccomends to continuedd skilled ST services.     Function:  Eating Eating                 Cognition Comprehension Comprehension assist level: Understands basic 50 - 74% of the time/ requires cueing 25 - 49% of the time  Expression   Expression assist level: Expresses basis less than 25% of the time/requires cueing >75% of the time.;Expresses basic 25 - 49% of the time/requires cueing 50 - 75% of the time. Uses single words/gestures.  Social  Interaction Social Interaction assist level: Interacts appropriately 75 - 89% of the time - Needs redirection for appropriate language or to initiate interaction.  Problem Solving Problem solving assist level: Solves basic 75 - 89% of the time/requires cueing 10 - 24% of the time  Memory Memory assist level: Recognizes or recalls 75 - 89% of the time/requires cueing 10 - 24% of the time    Pain Pain Assessment Pain Score: 0-No pain  Therapy/Group: Individual Therapy  Krzysztof Reichelt  [facility] 02/09/2018, 12:10 PM

## 2018-02-09 NOTE — Progress Notes (Signed)
Subjective/Complaints:  Frontal HA this am mild. Some congestion.  Pt responds to simple Y/N questions Family (daughter) at bedside  Review of systems: Unable to obtain secondary to aphasia.  Objective: Vital Signs: Blood pressure (!) 146/97, pulse 98, temperature 98.2 F (36.8 C), resp. rate 18, height _0  (1.6 m), weight 73.5 kg (162 lb), last menstrual period 11/05/1992, SpO2 100 %. Ct Head Wo Contrast  Result Date: 02/07/2018 CLINICAL DATA:  77 y/o  F; stroke for follow-up. EXAM: CT HEAD WITHOUT CONTRAST TECHNIQUE: Contiguous axial images were obtained from the base of the skull through the vertex without intravenous contrast. COMPARISON:  01/31/2018 CT head and 01/25/2018 MRI head. FINDINGS: Brain: Stable distribution of left MCA infarct with increased perisylvian cortical density, likely developing laminar necrosis. Interval partial dispersion of left basal ganglia hematoma. Decreased edema and mass effect with improved patency of the left lateral ventricle and resolution of midline shift. No new acute stroke, hemorrhage, or focal mass effect identified. Stable background of chronic microvascular ischemic changes and parenchymal volume loss of the brain. Stable chronic infarctions within the left superior cerebellum, right caudate body, and right caudate head. Vascular: Calcific atherosclerosis of carotid siphons. Skull: Normal. Negative for fracture or focal lesion. Sinuses/Orbits: No acute finding. Other: None. IMPRESSION: 1. Stable distribution of left MCA infarct given differences in technique. Increased density of left perisylvian cortex, likely developing laminar necrosis. 2. Partial dispersion of left basal ganglia hematoma, decreased edema, and decreased mass effect. 3. No new acute intracranial abnormality identified. Electronically Signed   By: Kristine Garbe M.D.   On: 02/07/2018 14:13   Results for orders placed or performed during the hospital encounter of 01/27/18 (from  the past 72 hour(s))  Basic metabolic panel     Status: Abnormal   Collection Time: 02/07/18  6:26 AM  Result Value Ref Range   Sodium 140 135 - 145 mmol/L   Potassium 3.8 3.5 - 5.1 mmol/L   Chloride 105 98 - 111 mmol/L    Comment: Please note change in reference range.   CO2 27 22 - 32 mmol/L   Glucose, Bld 90 70 - 99 mg/dL    Comment: Please note change in reference range.   BUN 23 8 - 23 mg/dL    Comment: Please note change in reference range.   Creatinine, Ser 1.27 (H) 0.44 - 1.00 mg/dL   Calcium 9.0 8.9 - 10.3 mg/dL   GFR calc non Af Amer 40 (L) >60 mL/min   GFR calc Af Amer 46 (L) >60 mL/min    Comment: (NOTE) The eGFR has been calculated using the CKD EPI equation. This calculation has not been validated in all clinical situations. eGFR's persistently <60 mL/min signify possible Chronic Kidney Disease.    Anion gap 8 5 - 15    Comment: Performed at [facility] [address], [place], [place] [address]     Constitutional: No distress . Vital signs reviewed. HENT: Normocephalic.  Atraumatic. Eyes: EOMI. No discharge. Cardiovascular: RRR. No JVD. Respiratory: CTA Bilaterally. Normal effort. GI: BS +. Non-distended. Musc: No edema or tenderness in extremities. Neuro: Alert Global aphasia Limited ability to follow commands, ?4/5 throughout (unchanged) Psych: Unable to assess due to aphasia  Assessment/Plan: 1. 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 Physiatrist is providing close team supervision and 24 hour management of active medical problems listed below. Physiatrist  and rehab team continue to assess barriers to discharge/monitor patient progress toward functional and medical goals. FIM: Function - Bathing Position: Shower Body parts bathed by patient: Right arm, Left arm, Chest, Left upper leg, Right upper  leg, Front perineal area, Abdomen, Buttocks, Right lower leg, Left lower leg, Back Body parts bathed by helper: Back Assist Level: Touching or steadying assistance(Pt > 75%)  Function- Upper Body Dressing/Undressing What is the patient wearing?: Pull over shirt/dress Pull over shirt/dress - Perfomed by patient: Thread/unthread right sleeve, Thread/unthread left sleeve, Put head through opening, Pull shirt over trunk Pull over shirt/dress - Perfomed by helper: Pull shirt over trunk, Thread/unthread right sleeve Assist Level: Supervision or verbal cues Function - Lower Body Dressing/Undressing What is the patient wearing?: Pants, Shoes, Ted Hose Position: Wheelchair/chair at [employer]- Performed by patient: Thread/unthread right pants leg, Thread/unthread left pants leg, Pull pants up/down Pants- Performed by helper: Thread/unthread right pants leg Non-skid slipper socks- Performed by patient: Don/doff left sock Non-skid slipper socks- Performed by helper: Don/doff right sock, Don/doff left sock Socks - Performed by patient: Don/doff left sock, Don/doff right sock Socks - Performed by helper: Don/doff right sock Shoes - Performed by patient: Don/doff right shoe, Don/doff left shoe, Fasten right, Fasten left Shoes - Performed by helper: Don/doff right shoe TED Hose - Performed by helper: Don/doff right TED hose, Don/doff left TED hose Assist for footwear: Supervision/touching assist Assist for lower body dressing: Touching or steadying assistance (Pt > 75%)  Function - Toileting Toileting steps completed by patient: Adjust clothing prior to toileting, Adjust clothing after toileting, Performs perineal hygiene Toileting steps completed by helper: Performs perineal hygiene, Adjust clothing after toileting Toileting Assistive Devices: Grab bar or rail Assist level: Touching or steadying assistance (Pt.75%)  Function - Air cabin crew transfer activity did not occur: Safety/medical  concerns Toilet transfer assistive device: Grab bar, Environmental manager lift: Stedy Assist level to toilet: Touching or steadying assistance (Pt > 75%) Assist level from toilet: Touching or steadying assistance (Pt > 75%)  Function - Chair/bed transfer Chair/bed transfer method: Stand pivot, Ambulatory Chair/bed transfer assist level: Touching or steadying assistance (Pt > 75%) Chair/bed transfer assistive device: Armrests Chair/bed transfer details: Verbal cues for technique, Verbal cues for precautions/safety, Manual facilitation for weight shifting  Function - Locomotion: Wheelchair Will patient use wheelchair at discharge?: Yes(TBD) Type: Manual Max wheelchair distance: 25' Assist Level: Total assistance (Pt < 25%) Wheel 50 feet with 2 turns activity did not occur: Safety/medical concerns Wheel 150 feet activity did not occur: Safety/medical concerns Turns around,maneuvers to table,bed, and toilet,negotiates 3% grade,maneuvers on rugs and over doorsills: No Function - Locomotion: Ambulation Assistive device: Walker-rolling Max distance: 80 ft Assist level: Touching or steadying assistance (Pt > 75%) Assist level: Touching or steadying assistance (Pt > 75%) Walk 50 feet with 2 turns activity did not occur: Safety/medical concerns Assist level: Touching or steadying assistance (Pt > 75%) Walk 150 feet activity did not occur: Safety/medical concerns Assist level: Touching or steadying assistance (Pt > 75%) Walk 10 feet on uneven surfaces activity did not occur: Safety/medical concerns  Function - Comprehension Comprehension: Auditory Comprehension assist level: Understands basic 50 - 74% of the time/ requires cueing 25 - 49% of the time  Function - Expression Expression: Verbal, Nonverbal Expression assist level: Expresses basis less than 25% of the time/requires cueing >75% of the time., Expresses basic 25 - 49% of the time/requires cueing 50 - 75% of the time. Uses single  words/gestures.  Function -  Social Interaction Social Interaction assist level: Interacts appropriately 75 - 89% of the time - Needs redirection for appropriate language or to initiate interaction.  Function - Problem Solving Problem solving assist level: Solves basic 75 - 89% of the time/requires cueing 10 - 24% of the time  Function - Memory Memory assist level: Recognizes or recalls 75 - 89% of the time/requires cueing 10 - 24% of the time Patient normally able to recall (first 3 days only): That he or she is in a hospital, Staff names and faces, Current season, Location of own room  Medical Problem List and Plan: 1.Right hemiparesis with expressive aphasiasecondary to left MCA infarction  Cont CIR 2. DVT Prophylaxis/Anticoagulation: SCDs.   Head CT on 6/23 reviewed, showing persistent hemorrhagic transformation. Cont Aspirin, reviewed repeat head CT  As per Neuro ok for anticoag, d/w daughter risk of restarting anticoag vs risk of ischemic CVA with AFib.  Discussed need for lifelong warfarin.  I had long discussion with cardiology (Dr Ellyn Hack) as well as input from neuro.  Pros and cons of Warfarin vs DOAC discussed .  Discussed off label use of Eliquis in valvular Afib.  Family wil discuss amongst themselves.  Pt unable to understand decision making process   3. Pain Management:Hydrocodone as needed 4. Mood:Provide emotional support 5. Neuropsych: This patientis notcapable of making decisions on herown behalf. 6. Skin/Wound Care:Routine skin checks 7. 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 Follow-up cardiology services. Chronic amiodarone on hold as patient's cardiac rate controlled. CHRONIC COUMADINcurrently on hold due to high risk of hemorrhage after recent CVA  9.Chronic diastolic congestive heart failure. Monitor for any signs of fluid overload Filed Weights   02/07/18 0500 02/08/18 0545 02/09/18  0428  Weight: 73.9 kg (162 lb 14.7 oz) 74.1 kg (163 lb 5.8 oz) 73.5 kg (162 lb)   No clinical signs of overload 7/2 10.Hypertension. Patient on amiodarone 200 mg daily, HCTZ 25 mg daily prior to admission. Resume as needed Vitals:   02/08/18 2021 02/09/18 0428  BP: (!) 128/51 (!) 146/97  Pulse: 77 98  Resp: 18 18  Temp: 98.1 F (36.7 C) 98.2 F (36.8 C)  SpO2: 97% 100%   Controlled 7/1 elevated diastolic 7/2 monitor   Chlorthalidone 25 started on 6/25, Increased to 50 on 6/27  good range need ongoing K supplements 11.Hyperlipidemia. Lipitor 12.  Hx Afib but amiodarone held per cardiology due to junctional rythmn, asymptomatic 13. CKD 3a  Creatinine 1.27 on 6/30  Encourage fluids  Cont to monitor 14. 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 Acute blood loss anemia  Hemoglobin 9.9 on 6/26  Continue to monitor  17. Acute lower UTI  UA +, U culture with multiple species  Empiric Macrobid started on 6/27 - 7/4 18. Hypokalemia  Potassium 3.8 on 6/30  Supplemented 2 days 19,  Mild HA, ? Sinus congestion, saline nasal spray LOS (Days) 13 A FACE TO FACE EVALUATION WAS PERFORMED  Charlett Blake 02/09/2018, 7:45 AM

## 2018-02-09 NOTE — Progress Notes (Signed)
Neurology has reviewed latest cranial CT scan and consensus at this time is for Eliquis for anticoagulation 5 mg twice daily

## 2018-02-09 NOTE — Progress Notes (Signed)
Occupational Therapy Session Note  Patient Details  Name: Brooke Chavez MRN: [id number] Date of Birth: June 01, 1941  Today's Date: 02/09/2018 OT Individual Time: 5747-3403 OT Individual Time Calculation (min): 65 min  and Today's Date: 02/09/2018 OT Missed Time: 10 Minutes Missed Time Reason: Pain(8/10 headache)   Short Term Goals: Week 1:  OT Short Term Goal 1 (Week 1): Pt will look to the R to locate 1/4 grooming items with mod questioning cues. OT Short Term Goal 1 - Progress (Week 1): Met OT Short Term Goal 2 (Week 1): Pt will complete UB dressing using hemi techniques with mod questioning cues OT Short Term Goal 2 - Progress (Week 1): Met OT Short Term Goal 3 (Week 1): Pt will complete toilet transfer with Mod A consistent Mod A OT Short Term Goal 3 - Progress (Week 1): Met OT Short Term Goal 4 (Week 1): Pt will integrate R UE into bathing taks with no more than mod questioning cues OT Short Term Goal 4 - Progress (Week 1): Met Week 2:  OT Short Term Goal 1 (Week 2): STG=LTG 2/2 ELOS  Skilled Therapeutic Interventions/Progress Updates:    Pt received in bed with pt c/o of headache.  She wanted to shower but she felt she did not have energy to do it from her headache expressed by pt shaking head no and pointing to head.  She nodded yes to wash up at sink.  Pt practiced getting up from a flat bed without rails with S.  Pt declined need to toilet first.   Pt sat for a few minutes to transition and then used RW to step towards w/c at sink. Pt initially worked on standing at sink to complete oral care.  Pt was having great difficulty maintaining her balance as she was leaning to her R not actively extending her R leg requiring mod A to support balance.  Pt was not responding to cues to extend R leg or lean to the left as she was focused on using B hands to clean her dentures.  Pt having great difficulty with divided attention.  She also needed guidance as she initially put toothpaste on her  toothbrush holder vs the toothbrush.  Her brief was soaked from urine. Brief removed and pt bathed self at sink with sit to stands with improved balance and touching A for balance.  Good body awareness with bathing.  She donned shirt without cues from sitting, cues to place R foot in pant leg first.  Assisted with donning TED hose and socks.  Because pt was continually closing eyes and keeping head down due to HA, pt transferred back to bed to rest. Bed alarm set. Sister in room with pt.   Therapy Documentation Precautions:  Precautions Precautions: Fall Restrictions Weight Bearing Restrictions: No General: General OT Amount of Missed Time: 10 Minutes Vital Signs: Therapy Vitals Pulse Rate: 75 BP: 124/74 Patient Position (if appropriate): Sitting Pain: Pain Assessment Pain Scale: 0-10 Pain Score: 8 (received tylenol at 0707) ADL: ADL ADL Comments: Please see functional navigator  See Function Navigator for Current Functional Status.   Therapy/Group: Individual Therapy  [place] 02/09/2018, 9:38 AM

## 2018-02-09 NOTE — Progress Notes (Signed)
TCTS BRIEF PROGRESS NOTE   Progress noted Appreciate care from entire CIR team  Rexene Alberts, MD 02/09/2018 1:02 PM

## 2018-02-10 ENCOUNTER — Inpatient Hospital Stay (HOSPITAL_COMMUNITY): Payer: Medicare Other | Admitting: Physical Therapy

## 2018-02-10 ENCOUNTER — Inpatient Hospital Stay (HOSPITAL_COMMUNITY): Payer: Medicare Other | Admitting: Occupational Therapy

## 2018-02-10 ENCOUNTER — Inpatient Hospital Stay (HOSPITAL_COMMUNITY): Payer: Medicare Other | Admitting: Speech Pathology

## 2018-02-10 LAB — BASIC METABOLIC PANEL
Anion gap: 14 (ref 5–15)
BUN: 28 mg/dL — ABNORMAL HIGH (ref 8–23)
CO2: 25 mmol/L (ref 22–32)
Calcium: 9.8 mg/dL (ref 8.9–10.3)
Chloride: 103 mmol/L (ref 98–111)
Creatinine, Ser: 1.51 mg/dL — ABNORMAL HIGH (ref 0.44–1.00)
GFR calc Af Amer: 37 mL/min — ABNORMAL LOW (ref >60)
GFR calc non Af Amer: 32 mL/min — ABNORMAL LOW (ref >60)
Glucose, Bld: 97 mg/dL (ref 70–99)
Potassium: 4.1 mmol/L (ref 3.5–5.1)
Sodium: 142 mmol/L (ref 135–145)

## 2018-02-10 NOTE — Progress Notes (Signed)
Physical Therapy Session Note  Patient Details  Name: Brooke Chavez MRN: [id number] Date of Birth: Aug 30, 1940  Today's Date: 02/10/2018 PT Individual Time: 1100-1130 PT Individual Time Calculation (min): 30 min, Make up time  Short Term Goals: Week 2: STG=LTG  Skilled Therapeutic Interventions/Progress Updates:    Pt seen for 30 minutes of make up time. Pt seated in w/c upon PT arrival, agreeable to therapy tx and denies pain. Pt transported to the gym. Pt ambulated 3 x 60 ft this session with RW and min assist, verbal cues for R foot clearance, increased step length and R LE awareness. Pt worked on R LE NMR and balance to perform toe taps on 4 inch step, x 2 trials. Pt worked on standing R hip flexion to touch knee to target, 2 x 10 for neuro re-ed. Pt performed x 10 sit<>stands from edge of mat without UE support, CGA for balance, once in standing pt worked on static balance while maintaining midline. Pt handed off to PT for next session.   Therapy Documentation Precautions:  Precautions Precautions: Fall Restrictions Weight Bearing Restrictions: No   See Function Navigator for Current Functional Status.   Therapy/Group: Individual Therapy  Netta Corrigan, PT, DPT 02/10/2018, 11:12 AM

## 2018-02-10 NOTE — Progress Notes (Signed)
Occupational Therapy Session Note  Patient Details  Name: Brooke Chavez MRN: [id number] Date of Birth: [birth year]  Today's Date: 02/10/2018 OT Individual Time: 2876-8115 and 1430-1500 OT Individual Time Calculation (min): 60 min    Short Term Goals: Week 2:  OT Short Term Goal 1 (Week 2): STG=LTG 2/2 ELOS  Skilled Therapeutic Interventions/Progress Updates:    Session One: Pt sen for OT ADL bathing/dressing session. Pt asleep in supine upon arrival, smiling in agreementt to tx session. She was unable to verbalize preference of "eat" vs "shower" when presented with two options. She ambulated throughout session with overall CGA- min A using RW, VCs for RW management and clearance of R LE during ambulation. She bathed seated on BSC in shower, required VCs for sequencing and attention to all areas of the body. On multiple occasions, pt stood and without UE support to attempt to wash UB from standing position, steadying assist required and cuing given each time to sit for bathing task due to poor standing balance and endurance, however, no carryover of cuing. She returned to w/c to dress, able to correctly orient clothing when presented with folded articles, however, during grooming tasks had difficulty with object recognition and sequencing, using toothbrush cover as a toothbrush when attempting to clean dentures. During standing clothing management, pt with R lean, requiring heavy steadying assist for balance, unaware of LOB episodes and not following cuing from therapist to return to support of RW or weightshift. Unsure apraxia vs. Receptive aphasia.  Pt ate breakfast seated in w/c, able to open containers independently. Required VCs to complete oral clearing and for R pocketing with breakfast meat. Pt left seated in w/c at end of session with chair alarm on and granddaughter present.   Session Two: Pt seen for OT session focusing on functional ambulation and ADL re-training. Pt asleep in supine upon  arrival, easily awoken and agreeable to tx session. Required increased time to come to sitting EOB, however, able to complete at supervision level without hospital bed features in simluation of home environment. Pt able to piece together wording to make toileting needs known. She ambulated within room initally with CGA, however, episodes of mod A due to R foot drag and R LE going outside of RW, requiring VCs for awareness of poor positioning.  She completed toileting task with steadying assist, assist provided for thoroughness of buttock hygiene following BM. Pt able to use R hand functionally to assist with hygiene and clothing management, however, due to decreased sensation, was unaware she had stool on hand and cont to use. Pt returned to sink and completed hand hygiene from standing position at sink, heavy reliance into sink for balance with limited WBing through R LE. Pt returned to w/c, encouraged to eat as she missed lunch and with poor PO intake. Pt self fed ~50% of pudding cup. Pt taken to therapy gym total A in w/c,left with hand off to PT.    Therapy Documentation Precautions:  Precautions Precautions: Fall Restrictions Weight Bearing Restrictions: No Pain:   No/denies pain ADL: ADL ADL Comments: Please see functional navigator  See Function Navigator for Current Functional Status.   Therapy/Group: Individual Therapy  Deliliah Spranger L 02/10/2018, 7:15 AM

## 2018-02-10 NOTE — Progress Notes (Signed)
Speech Language Pathology Daily Session Note  Patient Details  Name: Brooke Chavez MRN: [id number] Date of Birth: Aug 11, 1941  Today's Date: 02/10/2018 SLP Individual Time: 0900-0930 SLP Individual Time Calculation (min): 30 min  Short Term Goals: Week 2: SLP Short Term Goal 1 (Week 2): Using melodic carrier phrase, pt will state object name in 3 of 10 opportunities with Max A cues.  SLP Short Term Goal 2 (Week 2): Using melodic intonation, pt will produce common phrases with Max A cues in 5 out of 10 opportunities.  SLP Short Term Goal 3 (Week 2): Pt will follow 1 step directions with Mod A cues to achieve ~ 80% accuracy.  SLP Short Term Goal 4 (Week 2): Pt will scan to right of midline in 5 of 10 opportunities with Max A cues.  SLP Short Term Goal 5 (Week 2): Pt will consume regular textures with Min A cues for use of compensatory swallow strategies for complete oral clearing to demonstrate readiness for diet upgrade.   Skilled Therapeutic Interventions: Skilled treatment session focused on speech goals. SLP facilitated session by providing Max A phonemic, sentence completion and articulatory cues to name functional objects with 80% accuracy. Patient's granddaughter present throughout session. Patient left upright in wheelchair with family present. Continue with current plan of care.      Function:   Cognition Comprehension Comprehension assist level: Understands basic 50 - 74% of the time/ requires cueing 25 - 49% of the time  Expression   Expression assist level: Expresses basic 25 - 49% of the time/requires cueing 50 - 75% of the time. Uses single words/gestures.  Social Interaction Social Interaction assist level: Interacts appropriately 75 - 89% of the time - Needs redirection for appropriate language or to initiate interaction.  Problem Solving Problem solving assist level: Solves basic 75 - 89% of the time/requires cueing 10 - 24% of the time  Memory Memory assist level: Recognizes  or recalls 75 - 89% of the time/requires cueing 10 - 24% of the time    Pain No/Denies Pain   Therapy/Group: Individual Therapy  Elmin Wiederholt 02/10/2018, 1:26 PM

## 2018-02-10 NOTE — Progress Notes (Signed)
Orthopedic Tech Progress Note Patient Details:  Brooke Chavez 1941/01/30 [id number]  Patient ID: [name], female   DOB: 11/22/1940, 77 y.o.   MRN: [id number]   [name] 02/10/2018, 1:13 PM Called in hanger brace order; spoke with [facility]

## 2018-02-10 NOTE — Patient Care Conference (Signed)
Inpatient RehabilitationTeam Conference and Plan of Care Update Date: 02/10/2018   Time: 11:15 AM    Patient Name: Brooke Chavez      Medical Record Number: [id number]  Date of Birth: 1941/01/20 Sex: Female         Room/Bed: 4W22C/4W22C-01 Payor Info: Payor: MEDICARE / Plan: MEDICARE PART A AND B / Product Type: *No Product type* /    Admitting Diagnosis: CVA  Admit Date/Time:  01/27/2018  3:39 PM Admission Comments: No comment available   Primary Diagnosis:  <principal problem not specified> Principal Problem: <principal problem not specified>  Patient Active Problem List   Diagnosis Date Noted  . Atrial fibrillation ([place])   . Hypokalemia   . Acute lower UTI   . H/O mitral valve repair   . Benign essential HTN   . History of atrial fibrillation   . Stage 3 chronic kidney disease ([place])   . Hypoalbuminemia due to protein-calorie malnutrition ([place])   . Global aphasia   . Acute blood loss anemia   . 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 Hemiparesis of right dominant side as late effect of cerebral infarction ([place])   . 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 Gait disturbance, post-stroke   . Dysphagia, post-stroke   . Advance care planning   . Goals of care, counseling/discussion   . Palliative care by specialist   . Acute ischemic left MCA stroke ([place]) 01/24/2018  . Stroke (cerebrum) ([place]) 01/24/2018  . Encounter for therapeutic drug monitoring 01/18/2018  . 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 Chronic diastolic congestive heart failure ([place])   . 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 Liver masses 11/13/2017  . BMI 30.0-30.9,adult 07/24/2015  . Osteoporosis 09/19/2014  . GERD (gastroesophageal reflux disease) 07/29/2011  . Atrial flutter ([place]) 05/29/2010  . Hyperlipidemia with target LDL less than 100 01/04/2009  . Essential hypertension 01/04/2009    Expected Discharge Date: Expected Discharge Date:  02/13/18  Team Members Present: Physician leading conference: Dr. Alysia Penna Social Worker Present: Ovidio Kin, LCSW Nurse Present: Rozetta Nunnery, RN PT Present: Loreen Freud, PT OT Present: Roanna Epley, COTA;Napoleon Form, OT SLP Present: Weston [name], SLP PPS Coordinator present : Daiva Nakayama, RN, CRRN     Current Status/Progress Goal Weekly Team Focus  Medical   Repeat CT showed resolution of hematoma, now on Eliquis, some decline of fxn  Reduce fall risk  D/C planning , monitor on anticoag, AFO   Bowel/Bladder   Cont of bowel and bladder in AM. INC at HS.   To reamin skin breakdown free.   Continue to clean and dry skin as needed to prevent breakdown.    Swallow/Nutrition/ Hydration   Dys. 3 textures with thin liuqids with Min A for oral clearing   Supervision with least restrictive diet  self-monitoring and correcting oral residue    ADL's   Steadying assist- occasional mod A when fatigued or with divided attention during standing ADLs  Downgraded to min A oberall  Functional standing balance/endurance, neuro re-ed, ADL re-training, family ed, d/c planning   Mobility   min assist overall with RW, gait up to 150 ft  supervision, gait household distances  R NMR, gait/transfers, fam ed, balance   Communication   Max A  Mod A with basic  naming, functional phrases    Safety/Cognition/ Behavioral Observations  Max A   Mod A   problem solving    Pain  Recently receivedn PRN for pain. Medication effective per family.   To maintain pain rate of 3 or below.   Continue to treat pain as needed and report any concerns to MD.    Skin   Facial abrasion healed.  To heal properly without infection.   Keep skin clean and dry to prevent infection      *See Care Plan and progress notes for long and short-term goals.     Barriers to Discharge  Current Status/Progress Possible Resolutions Date Resolved   Physician    Medical stability;Incontinence     Progressing toward goals  Monitor        Nursing                  PT                    OT                  SLP                SW                Discharge Planning/Teaching Needs:  Home with daughter and multiple family members to be assisting. Numerous siblings have been here daily to attend therapies with pt.      Team Discussion:Working toward goals of supervision-min assist. Last few days has not done as well in therapies, MD checking BMET and treating UTI. Has also restarted Eliquois. AFO ordered for home. Poor po intake and need to encourage fluids. Timed tolieting for continence. May need to downgrade OT goals. Family education tomorrow with daughter's.  Revisions to Treatment Plan:  DC 7/6    Continued Need for Acute Rehabilitation Level of Care: The patient requires daily medical management by a physician with specialized training in physical medicine and rehabilitation for the following conditions: Daily direction of a multidisciplinary physical rehabilitation program to ensure safe treatment while eliciting the highest outcome that is of practical value to the patient.: Yes Daily medical management of patient stability for increased activity during participation in an intensive rehabilitation regime.: Yes Daily analysis of laboratory values and/or radiology reports with any subsequent need for medication adjustment of medical intervention for : Neurological problems  Adysen Raphael, Gardiner Rhyme 02/10/2018, 1:39 PM

## 2018-02-10 NOTE — Progress Notes (Signed)
Subjective/Complaints:  No issues overnite, remains aphasic  Review of systems: Unable to obtain secondary to aphasia.  Objective: Vital Signs: Blood pressure (!) 144/74, pulse 73, temperature 97.6 F (36.4 C), temperature source Axillary, resp. rate 17, height 5' 3"  (1.6 m), weight 67.8 kg (149 lb 7.6 oz), last menstrual period 11/05/1992, SpO2 100 %. No results found. No results found for this or any previous visit (from the past 72 hour(s)).   Constitutional: No distress . Vital signs reviewed. HENT: Normocephalic.  Atraumatic. Eyes: EOMI. No discharge. Cardiovascular: RRR. No JVD. Respiratory: CTA Bilaterally. Normal effort. GI: BS +. Non-distended. Musc: No edema or tenderness in extremities. Neuro: Alert Global aphasia Limited ability to follow commands, ?4/5 throughout (unchanged) Psych: Unable to assess due to aphasia  Assessment/Plan: 1. 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 Physiatrist is providing close team supervision and 24 hour management of active medical problems listed below. Physiatrist and rehab team continue to assess barriers to discharge/monitor patient progress toward functional and medical goals. FIM: Function - Bathing Position: Wheelchair/chair at sink Body parts bathed by patient: Right arm, Left arm, Chest, Left upper leg, Right upper leg, Front perineal area, Abdomen, Buttocks, Right lower leg, Left lower leg Body parts bathed by helper: Back Assist Level: Touching or steadying assistance(Pt > 75%)  Function- Upper Body Dressing/Undressing What is the patient wearing?: Pull over shirt/dress Pull over shirt/dress - Perfomed by patient: Thread/unthread right sleeve, Thread/unthread left sleeve, Put head through opening, Pull shirt over trunk Pull over shirt/dress - Perfomed by helper: Pull shirt over  trunk, Thread/unthread right sleeve Assist Level: Set up Function - Lower Body Dressing/Undressing What is the patient wearing?: Pants, Shoes, Ted Hose Position: Wheelchair/chair at [employer]- Performed by patient: Thread/unthread right pants leg, Thread/unthread left pants leg, Pull pants up/down Pants- Performed by helper: Thread/unthread right pants leg Non-skid slipper socks- Performed by patient: Don/doff left sock Non-skid slipper socks- Performed by helper: Don/doff right sock, Don/doff left sock Socks - Performed by patient: Don/doff left sock, Don/doff right sock Socks - Performed by helper: Don/doff right sock Shoes - Performed by patient: Don/doff right shoe, Don/doff left shoe, Fasten right, Fasten left Shoes - Performed by helper: Don/doff right shoe TED Hose - Performed by helper: Don/doff right TED hose, Don/doff left TED hose Assist for footwear: Supervision/touching assist Assist for lower body dressing: Touching or steadying assistance (Pt > 75%)  Function - Toileting Toileting steps completed by patient: Performs perineal hygiene Toileting steps completed by helper: Adjust clothing prior to toileting, Adjust clothing after toileting Toileting Assistive Devices: Grab bar or rail Assist level: Touching or steadying assistance (Pt.75%)  Function - Air cabin crew transfer activity did not occur: Safety/medical concerns Toilet transfer assistive device: Grab bar, Environmental manager lift: Stedy Assist level to toilet: Touching or steadying assistance (Pt > 75%) Assist level from toilet: Touching or steadying assistance (Pt > 75%)  Function - Chair/bed transfer Chair/bed transfer method: Ambulatory Chair/bed transfer assist level: Touching or steadying assistance (Pt > 75%) Chair/bed transfer assistive device: Armrests, Walker Chair/bed transfer details: Verbal cues for technique, Verbal cues for precautions/safety, Manual facilitation for weight  shifting  Function - Locomotion: Wheelchair Will patient use wheelchair at discharge?: Yes(TBD) Type: Manual Max wheelchair distance: 25' Assist Level: Total assistance (Pt < 25%) Wheel 50 feet with 2 turns activity did not occur: Safety/medical concerns Wheel 150 feet activity did  not occur: Safety/medical concerns Turns around,maneuvers to table,bed, and toilet,negotiates 3% grade,maneuvers on rugs and over doorsills: No Function - Locomotion: Ambulation Assistive device: Walker-rolling Max distance: 10 Assist level: Moderate assist (Pt 50 - 74%) Assist level: Moderate assist (Pt 50 - 74%) Walk 50 feet with 2 turns activity did not occur: Safety/medical concerns Assist level: Touching or steadying assistance (Pt > 75%) Walk 150 feet activity did not occur: Safety/medical concerns Assist level: Touching or steadying assistance (Pt > 75%) Walk 10 feet on uneven surfaces activity did not occur: Safety/medical concerns  Function - Comprehension Comprehension: Auditory Comprehension assist level: Understands basic 50 - 74% of the time/ requires cueing 25 - 49% of the time  Function - Expression Expression: Verbal Expression assist level: Expresses basis less than 25% of the time/requires cueing >75% of the time., Expresses basic 25 - 49% of the time/requires cueing 50 - 75% of the time. Uses single words/gestures.  Function - Social Interaction Social Interaction assist level: Interacts appropriately 75 - 89% of the time - Needs redirection for appropriate language or to initiate interaction.  Function - Problem Solving Problem solving assist level: Solves basic 75 - 89% of the time/requires cueing 10 - 24% of the time  Function - Memory Memory assist level: Recognizes or recalls 75 - 89% of the time/requires cueing 10 - 24% of the time Patient normally able to recall (first 3 days only): That he or she is in a hospital, Staff names and faces, Current season, Location of own  room  Medical Problem List and Plan: 1.Right hemiparesis with expressive aphasiasecondary to left MCA infarction  Team conference today please see physician documentation under team conference tab, met with team face-to-face to discuss problems,progress, and goals. Formulized individual treatment plan based on medical history, underlying problem and comorbidities. 2. DVT Prophylaxis/Anticoagulation: SCDs.   Head CT on 6/23 reviewed, showing persistent hemorrhagic transformation.  Pt started on Eliquis     3. Pain Management:Hydrocodone as needed 4. Mood:Provide emotional support 5. Neuropsych: This patientis notcapable of making decisions on herown behalf. 6. Skin/Wound Care:Routine skin checks 7. 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 Follow-up cardiology services. Chronic amiodarone on hold as patient's cardiac rate controlled. CHRONIC COUMADINcurrently on hold due to high risk of hemorrhage after recent CVA  9.Chronic diastolic congestive heart failure. Monitor for any signs of fluid overload Filed Weights   02/08/18 0545 02/09/18 0428 02/10/18 0516  Weight: 74.1 kg (163 lb 5.8 oz) 73.5 kg (162 lb) 67.8 kg (149 lb 7.6 oz)   No clinical signs of overload 7/3, ? Reliability of weights 10.Hypertension. Patient on amiodarone 200 mg daily, HCTZ 25 mg daily prior to admission. Resume as needed Vitals:   02/09/18 2049 02/10/18 0436  BP: 109/87 (!) 144/74  Pulse: (!) 45 73  Resp: 17 17  Temp: (!) 97.5 F (36.4 C) 97.6 F (36.4 C)  SpO2: 100% 100%   Controlled 7/3  Chlorthalidone 25 started on 6/25, Increased to 50 on 6/27  good range need ongoing K supplements 11.Hyperlipidemia. Lipitor 12.  Hx Afib but amiodarone held per cardiology due to junctional rythmn, asymptomatic 13. CKD 3a  Creatinine 1.27 on 6/30, recheck BMET in am  Encourage fluids  Cont to monitor 14. Hypoalbuminemia  Supplement  initiated on 6/24  15. Post stroke dysphagia  D3 thins, advance diet as tolerated 16. Acute blood loss anemia  Hemoglobin 9.9 on 6/26  Continue to monitor  17. Acute lower UTI  UA +, U culture with multiple species  Empiric Macrobid started on 6/27 - 7/4 18. Hypokalemia  Potassium 3.8 on 6/30  Supplemented 2 days 19,  Mild HA, ? Sinus congestion, saline nasal spray LOS (Days) 14 A FACE TO FACE EVALUATION WAS PERFORMED  Brooke Chavez 02/10/2018, 8:35 AM

## 2018-02-10 NOTE — Plan of Care (Signed)
LBM 6/29. PRN given

## 2018-02-10 NOTE — Progress Notes (Signed)
Social Work   Micaiah Litle, Eliezer Champagne  Social Worker  Physical Medicine and Rehabilitation  Patient Care Conference  Signed  Date of Service:  02/10/2018  1:39 PM          Signed          Show:Clear all [x] Manual[x] Template[] Copied  Added by: [x] Seaver Machia, Gardiner Rhyme, LCSW   [] Hover for details   Inpatient RehabilitationTeam Conference and Plan of Care Update Date: 02/10/2018   Time: 11:15 AM      Patient Name: Brooke Chavez      Medical Record Number: [id number]  Date of Birth: 10-24-1940 Sex: Female         Room/Bed: 4W22C/4W22C-01 Payor Info: Payor: MEDICARE / Plan: MEDICARE PART A AND B / Product Type: *No Product type* /     Admitting Diagnosis: CVA  Admit Date/Time:  01/27/2018  3:39 PM Admission Comments: No comment available    Primary Diagnosis:  <principal problem not specified> Principal Problem: <principal problem not specified>       Patient Active Problem List    Diagnosis Date Noted  . Atrial fibrillation ([place])    . Hypokalemia    . Acute lower UTI    . H/O mitral valve repair    . Benign essential HTN    . History of atrial fibrillation    . Stage 3 chronic kidney disease ([place])    . Hypoalbuminemia due to protein-calorie malnutrition ([place])    . Global aphasia    . Acute blood loss anemia    . 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 Hemiparesis of right dominant side as late effect of cerebral infarction ([place])    . 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 Gait disturbance, post-stroke    . Dysphagia, post-stroke    . Advance care planning    . Goals of care, counseling/discussion    . Palliative care by specialist    . Acute ischemic left MCA stroke ([place]) 01/24/2018  . Stroke (cerebrum) ([place]) 01/24/2018  . Encounter for therapeutic drug monitoring 01/18/2018  . 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 Chronic diastolic congestive heart failure ([place])    . Pancreatic mass  11/13/2017  . Liver masses 11/13/2017  . BMI 30.0-30.9,adult 07/24/2015  . Osteoporosis 09/19/2014  . GERD (gastroesophageal reflux disease) 07/29/2011  . Atrial flutter ([place]) 05/29/2010  . Hyperlipidemia with target LDL less than 100 01/04/2009  . Essential hypertension 01/04/2009      Expected Discharge Date: Expected Discharge Date: 02/13/18   Team Members Present: Physician leading conference: Dr. Alysia Penna Social Worker Present: Ovidio Kin, LCSW Nurse Present: Rozetta Nunnery, RN PT Present: Loreen Freud, PT OT Present: Roanna Epley, COTA;Napoleon Form, OT SLP Present: Weston [name], SLP PPS Coordinator present : Daiva Nakayama, RN, CRRN       Current Status/Progress Goal Weekly Team Focus  Medical     Repeat CT showed resolution of hematoma, now on Eliquis, some decline of fxn  Reduce fall risk  D/C planning , monitor on anticoag, AFO   Bowel/Bladder     Cont of bowel and bladder in AM. INC at HS.   To reamin skin breakdown free.   Continue to clean and dry skin as needed to prevent breakdown.    Swallow/Nutrition/ Hydration     Dys. 3 textures with thin liuqids with Min A for oral clearing   Supervision with least restrictive diet  self-monitoring and correcting  oral residue    ADL's     Steadying assist- occasional mod A when fatigued or with divided attention during standing ADLs  Downgraded to min A oberall  Functional standing balance/endurance, neuro re-ed, ADL re-training, family ed, d/c planning   Mobility     min assist overall with RW, gait up to 150 ft  supervision, gait household distances  R NMR, gait/transfers, fam ed, balance   Communication     Max A  Mod A with basic  naming, functional phrases    Safety/Cognition/ Behavioral Observations   Max A   Mod A   problem solving    Pain     Recently receivedn PRN for pain. Medication effective per family.   To maintain pain rate of 3 or below.   Continue to treat pain as needed and report any concerns to MD.      Skin     Facial abrasion healed.  To heal properly without infection.   Keep skin clean and dry to prevent infection     *See Care Plan and progress notes for long and short-term goals.      Barriers to Discharge   Current Status/Progress Possible Resolutions Date Resolved   Physician     Medical stability;Incontinence     Progressing toward goals  Monitor       Nursing                 PT                    OT                 SLP            SW              Discharge Planning/Teaching Needs:  Home with daughter and multiple family members to be assisting. Numerous siblings have been here daily to attend therapies with pt.      Team Discussion:Working toward goals of supervision-min assist. Last few days has not done as well in therapies, MD checking BMET and treating UTI. Has also restarted Eliquois. AFO ordered for home. Poor po intake and need to encourage fluids. Timed tolieting for continence. May need to downgrade OT goals. Family education tomorrow with daughter's.  Revisions to Treatment Plan:  DC 7/6    Continued Need for Acute Rehabilitation Level of Care: The patient requires daily medical management by a physician with specialized training in physical medicine and rehabilitation for the following conditions: Daily direction of a multidisciplinary physical rehabilitation program to ensure safe treatment while eliciting the highest outcome that is of practical value to the patient.: Yes Daily medical management of patient stability for increased activity during participation in an intensive rehabilitation regime.: Yes Daily analysis of laboratory values and/or radiology reports with any subsequent need for medication adjustment of medical intervention for : Neurological problems   Maurine Mowbray, Gardiner Rhyme 02/10/2018, 1:39 PM                  Oluwaseun Bruyere, Gardiner Rhyme, LCSW  Social Worker  Physical Medicine and Rehabilitation  Patient Care Conference  Signed  Date of  Service:  02/03/2018  2:07 PM          Signed          Show:Clear all [x] Manual[x] Template[] Copied  Added by: [x] Jatziri Goffredo, Gardiner Rhyme, LCSW   [] Hover for details   Inpatient RehabilitationTeam Conference and Plan of Care Update Date: 02/03/2018  Time: 11:10 AM      Patient Name: Brooke Chavez      Medical Record Number: [id number]  Date of Birth: 03-23-1941 Sex: Female         Room/Bed: 4W22C/4W22C-01 Payor Info: Payor: MEDICARE / Plan: MEDICARE PART A AND B / Product Type: *No Product type* /     Admitting Diagnosis: CVA  Admit Date/Time:  01/27/2018  3:39 PM Admission Comments: No comment available    Primary Diagnosis:  <principal problem not specified> Principal Problem: <principal problem not specified>       Patient Active Problem List    Diagnosis Date Noted  . Benign essential HTN    . History of atrial fibrillation    . Stage 3 chronic kidney disease ([place])    . Hypoalbuminemia due to protein-calorie malnutrition ([place])    . Global aphasia    . Acute blood loss anemia    . 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 Hemiparesis of right dominant side as late effect of cerebral infarction ([place])    . 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 Gait disturbance, post-stroke    . Dysphagia, post-stroke    . Advance care planning    . Goals of care, counseling/discussion    . Palliative care by specialist    . Acute ischemic left MCA stroke ([place]) 01/24/2018  . Stroke (cerebrum) ([place]) 01/24/2018  . Encounter for therapeutic drug monitoring 01/18/2018  . 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 Chronic diastolic congestive heart failure ([place])    . 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 Liver masses 11/13/2017  . BMI 30.0-30.9,adult 07/24/2015  . Osteoporosis 09/19/2014  . GERD (gastroesophageal reflux disease) 07/29/2011  . Atrial flutter ([place]) 05/29/2010  . Hyperlipidemia with target LDL less than 100  01/04/2009  . Essential hypertension 01/04/2009      Expected Discharge Date: Expected Discharge Date: 02/13/18   Team Members Present: Physician leading conference: Dr. Delice Lesch Social Worker Present: Ovidio Kin, LCSW Nurse Present: Other (comment)(Kayla Low-RN) PT Present: Michaelene Song, PT OT Present: Cherylynn Ridges, OT;Roanna Epley, COTA SLP Present: Windell Moulding, SLP PPS Coordinator present : Daiva Nakayama, RN, CRRN       Current Status/Progress Goal Weekly Team Focus  Medical     Right hemiparesis with expressive aphasia secondary to left MCA infarction  Improve mobility, follow up CT, optimize BP meds  See above   Bowel/Bladder     Cont of bowel and bladder in daytime. INC of bowel and bladder at night.   To reamin skin breakdown free.   Continue to keep skin clean and dry as needed.    Swallow/Nutrition/ Hydration     Dysphagia 3 with thin liquids with Min A for oral clearing with trials of regular  Supervision with least restrictive diet  trials of regular, use of lingual sweep for right oral clearing   ADL's     Min A transfers, Min A BADLs  Suoervision overall  R UE NMR, ataxia, apraxia, mdofied bathing/dressing, pt/family education   Mobility     min assist overall, gait w/ RW  supervision, gait household distances  R NMR, gait and transfers, family education, functional balance   Communication     Max A for gestural yes/no, severe expressive aphasia and apraxia of speech  Mod A with basic  naming objects, simple vowels   Safety/Cognition/ Behavioral Observations   Mod A for simple problem solving,  Max A to scan to right of midline, Min A for selective attention  Min A  scanning to right, basic problem solving, selective attention in mildly distracting environment   Pain               Skin     medium size rght side of face open to area.   To heal properly without infection.   Continue to use ointment and prescribe.      *See Care Plan and progress notes for  long and short-term goals.      Barriers to Discharge   Current Status/Progress Possible Resolutions Date Resolved   Physician     Medical stability     See above  Therapies, repeat CT, optimize BP meds, follow labs      Nursing                 PT  Home environment access/layout  3 steps to enter home w/o rails  have discussed getting rail or rails put up at home, children working on this           OT                 SLP            SW              Discharge Planning/Teaching Needs:  Home with daughter who works but Curator will be there or another family member. Pt will have 24 hr supervision at discharge.      Team Discussion:  Goals supervision level and currently min-mod level of assist. Working on balance and communication. On Dys 3 thin liquid diet. To repeat CT to see if can re-start blood thinner. BP issues adjusting meds. Family here daily attneding therapies.  Revisions to Treatment Plan:  DC 7/6    Continued Need for Acute Rehabilitation Level of Care: The patient requires daily medical management by a physician with specialized training in physical medicine and rehabilitation for the following conditions: Daily direction of a multidisciplinary physical rehabilitation program to ensure safe treatment while eliciting the highest outcome that is of practical value to the patient.: Yes Daily medical management of patient stability for increased activity during participation in an intensive rehabilitation regime.: Yes Daily analysis of laboratory values and/or radiology reports with any subsequent need for medication adjustment of medical intervention for : Neurological problems;Blood pressure problems;Renal problems   [name] 02/03/2018, 2:07 PM                 Patient ID: [name], female   DOB: 1941/01/08, 77 y.o.   MRN: [id number]

## 2018-02-10 NOTE — Progress Notes (Signed)
Physical Therapy Session Note  Patient Details  Name: Brooke Chavez MRN: [id number] Date of Birth: 10-20-1940  Today's Date: 02/10/2018 PT Individual Time: 1130-1155 AND 1500-1600 PT Individual Time Calculation (min): 25 min AND 60 min   Short Term Goals: Week 2:  PT Short Term Goal 1 (Week 2): =LTGs due to ELOS  Skilled Therapeutic Interventions/Progress Updates:   Session 1: Pt received in therapy gym from previous PT, agreeable to therapy and denies pain but reports fatigue. Session focused on overall endurance and LE strengthening. Performed NuStep 5 min @ level 3 x2 bouts for LE strengthening in reciprocal movement pattern as well as attention to task. Pt unable to remember timed stopping points despite frequent cues to stop at 5 or 10 mins. However, improved attention to R body overall, able to self-correct UE and LE placement w/o cues. Returned to room and assisted w/ transferring to supine to get rest before next therapy session. Ended session in supine, call bell within reach and all needs met.   Session 2:  Pt in w/c and agreeable to therapy, denies pain. 1st half of session focused on standing tolerance/balance and maintaining safe and neutral standing posture for prolonged periods of time. Performed bimanual tasks in standing to mimic standing up at sink or countertop for ADLs. Min verbal and tactile cues for RLE muscle activation and for upright posture. Tasks included sorting bean bags and naming of colors, playing [employer], and sequencing card task. Mod-max verbal cues for sequencing and motor planning overall. Able to tolerate standing for 3-4 min at a time before needing to take seated rest break 2/2 R lateral lean w/ fatigue. Worked on endurance w/ gait 2nd half of session, ortho rep present to assess use of foot-up brace and R toe cap for improved R foot clearance during gait. Ambulated 100' w/ min guard and verbal cues for gait pattern. Returned to room and ended session in w/c  and in care of granddaughter, all needs met.   Therapy Documentation Precautions:  Precautions Precautions: Fall Restrictions Weight Bearing Restrictions: No  See Function Navigator for Current Functional Status.   Therapy/Group: Individual Therapy  Adynn Caseres K Arnette 02/10/2018, 4:58 PM

## 2018-02-10 NOTE — Progress Notes (Signed)
Social Work Patient ID: Brooke Chavez, female   DOB: 1941-04-08, 77 y.o.   MRN: [id number]  Have left message for daughter-Nicole about family education tomorrow at 9;00-12:00. Pt and sister who is here is aware and will also let her know. Will await return call for confirmation

## 2018-02-11 ENCOUNTER — Inpatient Hospital Stay (HOSPITAL_COMMUNITY): Payer: Medicare Other | Admitting: Physical Therapy

## 2018-02-11 ENCOUNTER — Inpatient Hospital Stay (HOSPITAL_COMMUNITY): Payer: Medicare Other | Admitting: Speech Pathology

## 2018-02-11 ENCOUNTER — Inpatient Hospital Stay (HOSPITAL_COMMUNITY): Payer: Medicare Other | Admitting: Occupational Therapy

## 2018-02-11 ENCOUNTER — Inpatient Hospital Stay (HOSPITAL_COMMUNITY): Payer: Medicare Other

## 2018-02-11 MED ORDER — HYDROCHLOROTHIAZIDE 12.5 MG PO CAPS
12.5000 mg | ORAL_CAPSULE | Freq: Every day | ORAL | Status: DC
  Administered 2018-02-11 – 2018-02-13 (×3): 12.5 mg via ORAL
  Filled 2018-02-11 (×3): qty 1

## 2018-02-11 NOTE — Progress Notes (Signed)
Subjective/Complaints:  Aphasic, per therapy has been tearful, some reduced ADL ability in OT despite no change in strength Discussed mood with pt's duaghter who states pt mainly tearful after SLP session Discussed home meds, was on HCTZ Review of systems: Unable to obtain secondary to aphasia.  Objective: Vital Signs: Blood pressure (!) 147/72, pulse 72, temperature 98.8 F (37.1 C), temperature source Oral, resp. rate 18, height 5' 3"  (1.6 m), weight 67.6 kg (149 lb 0.5 oz), last menstrual period 11/05/1992, SpO2 98 %. No results found. Results for orders placed or performed during the hospital encounter of 01/27/18 (from the past 72 hour(s))  Basic metabolic panel     Status: Abnormal   Collection Time: 02/10/18 12:47 PM  Result Value Ref Range   Sodium 142 135 - 145 mmol/L   Potassium 4.1 3.5 - 5.1 mmol/L   Chloride 103 98 - 111 mmol/L    Comment: Please note change in reference range.   CO2 25 22 - 32 mmol/L   Glucose, Bld 97 70 - 99 mg/dL    Comment: Please note change in reference range.   BUN 28 (H) 8 - 23 mg/dL    Comment: Please note change in reference range.   Creatinine, Ser 1.51 (H) 0.44 - 1.00 mg/dL   Calcium 9.8 8.9 - 10.3 mg/dL   GFR calc non Af Amer 32 (L) >60 mL/min   GFR calc Af Amer 37 (L) >60 mL/min    Comment: (NOTE) The eGFR has been calculated using the CKD EPI equation. This calculation has not been validated in all clinical situations. eGFR's persistently <60 mL/min signify possible Chronic Kidney Disease.    Anion gap 14 5 - 15    Comment: Performed at [facility] [address] [place], [place] [address]     Constitutional: No distress . Vital signs reviewed. HENT: Normocephalic.  Atraumatic. Eyes: EOMI. No discharge. Cardiovascular: RRR. No JVD. Respiratory: CTA Bilaterally. Normal effort. GI: BS +. Non-distended. Musc: No edema or tenderness in extremities. Neuro: Alert Global aphasia Limited ability to follow commands, ?4/5  throughout (unchanged) Psych: Unable to assess due to aphasia  Assessment/Plan: 1. 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 Physiatrist is providing close team supervision and 24 hour management of active medical problems listed below. Physiatrist and rehab team continue to assess barriers to discharge/monitor patient progress toward functional and medical goals. FIM: Function - Bathing Position: Shower Body parts bathed by patient: Right arm, Left arm, Chest, Left upper leg, Right upper leg, Front perineal area, Abdomen, Buttocks, Right lower leg, Left lower leg Body parts bathed by helper: Back Assist Level: Touching or steadying assistance(Pt > 75%)  Function- Upper Body Dressing/Undressing What is the patient wearing?: Pull over shirt/dress Pull over shirt/dress - Perfomed by patient: Thread/unthread right sleeve, Thread/unthread left sleeve, Put head through opening, Pull shirt over trunk Pull over shirt/dress - Perfomed by helper: Pull shirt over trunk, Thread/unthread right sleeve Assist Level: Set up Function - Lower Body Dressing/Undressing What is the patient wearing?: Pants, Shoes, Ted Hose Position: Wheelchair/chair at [employer]- Performed by patient: Thread/unthread right pants leg, Thread/unthread left pants leg, Pull pants up/down Pants- Performed by helper: Thread/unthread right pants leg Non-skid slipper socks- Performed by patient: Don/doff left sock Non-skid slipper socks- Performed by helper: Don/doff right sock, Don/doff left sock Socks - Performed by patient: Don/doff left sock, Don/doff  right sock Socks - Performed by helper: Don/doff right sock Shoes - Performed by patient: Don/doff right shoe, Don/doff left shoe, Fasten right, Fasten left Shoes - Performed by helper: Don/doff right shoe TED Hose - Performed by  helper: Don/doff right TED hose, Don/doff left TED hose Assist for footwear: Supervision/touching assist Assist for lower body dressing: Touching or steadying assistance (Pt > 75%)  Function - Toileting Toileting steps completed by patient: Performs perineal hygiene Toileting steps completed by helper: Adjust clothing prior to toileting, Adjust clothing after toileting Toileting Assistive Devices: Grab bar or rail Assist level: Touching or steadying assistance (Pt.75%)  Function - Air cabin crew transfer activity did not occur: Safety/medical concerns Toilet transfer assistive device: Grab bar, Environmental manager lift: Stedy Assist level to toilet: Touching or steadying assistance (Pt > 75%) Assist level from toilet: Touching or steadying assistance (Pt > 75%)  Function - Chair/bed transfer Chair/bed transfer method: Ambulatory Chair/bed transfer assist level: Touching or steadying assistance (Pt > 75%) Chair/bed transfer assistive device: Armrests, Walker Chair/bed transfer details: Verbal cues for technique, Verbal cues for precautions/safety, Manual facilitation for weight shifting  Function - Locomotion: Wheelchair Will patient use wheelchair at discharge?: Yes(TBD) Type: Manual Max wheelchair distance: 25' Assist Level: Total assistance (Pt < 25%) Wheel 50 feet with 2 turns activity did not occur: Safety/medical concerns Wheel 150 feet activity did not occur: Safety/medical concerns Turns around,maneuvers to table,bed, and toilet,negotiates 3% grade,maneuvers on rugs and over doorsills: No Function - Locomotion: Ambulation Assistive device: Walker-rolling Max distance: 100' Assist level: Touching or steadying assistance (Pt > 75%) Assist level: Touching or steadying assistance (Pt > 75%) Walk 50 feet with 2 turns activity did not occur: Safety/medical concerns Assist level: Touching or steadying assistance (Pt > 75%) Walk 150 feet activity did not occur:  Safety/medical concerns Assist level: Touching or steadying assistance (Pt > 75%) Walk 10 feet on uneven surfaces activity did not occur: Safety/medical concerns  Function - Comprehension Comprehension: Auditory Comprehension assist level: Understands basic 50 - 74% of the time/ requires cueing 25 - 49% of the time  Function - Expression Expression: Verbal Expression assist level: Expresses basic 25 - 49% of the time/requires cueing 50 - 75% of the time. Uses single words/gestures.  Function - Social Interaction Social Interaction assist level: Interacts appropriately 75 - 89% of the time - Needs redirection for appropriate language or to initiate interaction.  Function - Problem Solving Problem solving assist level: Solves basic 75 - 89% of the time/requires cueing 10 - 24% of the time  Function - Memory Memory assist level: Recognizes or recalls 75 - 89% of the time/requires cueing 10 - 24% of the time Patient normally able to recall (first 3 days only): That he or she is in a hospital, Staff names and faces, Current season, Location of own room  Medical Problem List and Plan: 1.Right hemiparesis with expressive aphasiasecondary to left MCA infarction  Cont CIR PT,  OT, SLP  2. DVT Prophylaxis/Anticoagulation: SCDs.   Head CT on 6/23 reviewed, showing persistent hemorrhagic transformation.  Pt started on Eliquis  No sign of bleeding   3. Pain Management:Hydrocodone as needed 4. Mood:Provide emotional support 5. Neuropsych: This patientis notcapable of making decisions on herown behalf. 6. Skin/Wound Care:Routine skin checks 7. 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 Follow-up cardiology services. Chronic amiodarone on hold as patient's cardiac rate controlled. CHRONIC COUMADINcurrently on hold due to high risk of hemorrhage after recent CVA  9.Chronic  diastolic congestive heart failure. Monitor for  any signs of fluid overload Filed Weights   02/09/18 0428 02/10/18 0516 02/11/18 0444  Weight: 73.5 kg (162 lb) 67.8 kg (149 lb 7.6 oz) 67.6 kg (149 lb 0.5 oz)   No clinical signs of overload 7/3, ? Reliability of weights 10.Hypertension. Patient on amiodarone 200 mg daily, HCTZ 25 mg daily prior to admission. Resume as needed Vitals:   02/10/18 2027 02/11/18 0444  BP: 136/65 (!) 147/72  Pulse: 71 72  Resp: 18 18  Temp: 98.3 F (36.8 C) 98.8 F (37.1 C)  SpO2: 100% 98%   Controlled 7/4 Chlorthalidone 25 started on 6/25, Increased to 50 on 6/27 Took HCTZ at home and has meds at home will change back   good range need ongoing K supplements 11.Hyperlipidemia. Lipitor 12.  Hx Afib but amiodarone held per cardiology due to junctional rythmn, asymptomatic 13. CKD 3a  Creatinine 1.27 on 6/30, recheck BMET, Creat 1.5, GFR 37 inc fluids, likely related to diuretic    Encourage fluids, I 363m  Cont to monitor 14. 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 Acute blood loss anemia  Hemoglobin 9.9 on 6/26  Continue to monitor  17. Acute lower UTI  UA +, U culture with multiple species  Empiric Macrobid started on 6/27 - 7/4 18. Hypokalemia  Potassium 3.8 on 6/30  Supplemented 2 days  LOS (Days) 15 A FACE TO FACE EVALUATION WAS PERFORMED  ACharlett Blake7/11/2017, 8:32 AM

## 2018-02-11 NOTE — Progress Notes (Signed)
Speech Language Pathology Daily Session Note  Patient Details  Name: Brooke Chavez MRN: [id number] Date of Birth: Jul 29, 1941  Today's Date: 02/11/2018 SLP Individual Time: 1000-1055 SLP Individual Time Calculation (min): 55 min  Short Term Goals: Week 2: SLP Short Term Goal 1 (Week 2): Using melodic carrier phrase, pt will state object name in 3 of 10 opportunities with Max A cues.  SLP Short Term Goal 2 (Week 2): Using melodic intonation, pt will produce common phrases with Max A cues in 5 out of 10 opportunities.  SLP Short Term Goal 3 (Week 2): Pt will follow 1 step directions with Mod A cues to achieve ~ 80% accuracy.  SLP Short Term Goal 4 (Week 2): Pt will scan to right of midline in 5 of 10 opportunities with Max A cues.  SLP Short Term Goal 5 (Week 2): Pt will consume regular textures with Min A cues for use of compensatory swallow strategies for complete oral clearing to demonstrate readiness for diet upgrade.   Skilled Therapeutic Interventions: Skilled treatment session focused on family education. SLP facilitated session by providing verbal education as well as demonstration cues in regards to patient's current receptive and expressive language function and strategies/cues to utilize to maximize verbal expression and overall auditory comprehension of basic information. Patient's family also educated in regards to patient's current swallowing function, diet recommendations, appropriate textures, swallowing compensatory strategies, and medication administration. All verbalized and demonstrated understanding of information and all questions were answered. SLP also facilitated session by providing total A for patient to match a written word to a functional object from a field of 2. Patient left upright in wheelchair with all needs within reach and family present. Continue with current plan of care.       Function:  Eating Eating   Modified Consistency Diet: Yes Eating Assist Level: More  than reasonable amount of time;Set up assist for   Eating Set Up Assist For: Opening containers;Cutting food       Cognition Comprehension Comprehension assist level: Understands basic 50 - 74% of the time/ requires cueing 25 - 49% of the time  Expression   Expression assist level: Expresses basic 25 - 49% of the time/requires cueing 50 - 75% of the time. Uses single words/gestures.  Social Interaction Social Interaction assist level: Interacts appropriately 75 - 89% of the time - Needs redirection for appropriate language or to initiate interaction.  Problem Solving Problem solving assist level: Solves basic 75 - 89% of the time/requires cueing 10 - 24% of the time  Memory Memory assist level: Recognizes or recalls 75 - 89% of the time/requires cueing 10 - 24% of the time    Pain No/Denies Pain   Therapy/Group: Individual Therapy  Imo Cumbie 02/11/2018, 3:13 PM

## 2018-02-11 NOTE — Progress Notes (Signed)
Occupational Therapy Session Note  Patient Details  Name: Brooke Chavez MRN: [id number] Date of Birth: 1940/10/23  Today's Date: 02/11/2018 OT Individual Time: 0905-1000 OT Individual Time Calculation (min): 55 min    Short Term Goals: Week 2:  OT Short Term Goal 1 (Week 2): STG=LTG 2/2 ELOS  Skilled Therapeutic Interventions/Progress Updates:    Pt seen for OT session focusing on ADL re-training, functional transfers all as part of family education session. Pt in supine upon arrival with 2 daughters present for scheduled family ed session. Pt smiling in agreement to tx session. Pt completed dressed from w/c level, standing at RW during clothing management with min steadying assist. Education being provided throughout regarding pt's CLOF, importance of participation with ADLs, decreased sensation and functional implications, ideational apraxia, expressive and receptive aphasia, stroke recovery, continuum of care, fall risk, need for 24 hr close supervision- min A due to physical and cogntive deficits, pt's fluctuating levels of performance, DME, and d/c planning.  Pt taken to ADL apartment total A in w/c for time and energy conservation. IN apartment, explaned and demosntrated technique for shower stall transfer utilizing RW. Pt return demonstrated x2 trials, first with therapist assisting, second trial with daughter assisting. Both completed at steadying assist level, pt recalling technique for transfer following demonstration. Pt's grandson desiring to assist with ambulation, he provided steadying assist for ambulation from ADL apartment to SLP room, demonstrating much improved functional endurance, VCs to slow rate of ambulation. Pt left seated with hand off to SLP. Pt's family voicing feeling comfortable and confident for planned d/c home Saturday.  Throughout session, pt requiring multimodal cuing with inconsistent following of hand placement during sit <> stand.   Therapy  Documentation Precautions:  Precautions Precautions: Fall Restrictions Weight Bearing Restrictions: No Pain:   No/denies pain ADL: ADL ADL Comments: Please see functional navigator  See Function Navigator for Current Functional Status.   Therapy/Group: Individual Therapy  Kayleigh Broadwell L 02/11/2018, 6:40 AM

## 2018-02-11 NOTE — Progress Notes (Signed)
Physical Therapy Session Note  Patient Details  Name: Brooke Chavez MRN: [id number] Date of Birth: [birth year]  Today's Date: 02/11/2018 PT Individual Time: 1100-1156 PT Individual Time Calculation (min): 56 min   Short Term Goals: Week 2:  PT Short Term Goal 1 (Week 2): =LTGs due to ELOS  Skilled Therapeutic Interventions/Progress Updates:   Pt in w/c and agreeable to therapy, denies pain. Pt's daughters present for family education today. Performed gait, stairs, and transfers w/ supervision to min guard level assist. Daughters verbalized and demonstrated understanding of safe assist including providing verbal cues for sequencing and safety awareness. Discussed cognitive deficits that come up w/ functional mobility and making sure to make other caregivers/family members aware of deficits when assisting pt. Both verbalized understanding and in agreement. Attempted to practice floor transfer, however pt unable to safely reach ground 2/2 sequencing deficits and fear of falling. Educated daughters on how to assist transfer from floor to chair w/ therapist providing demonstration. Additionally discussed when to call MD w/ a fall, signs to look for of another stroke, and when to call 911 w/ a fall. Cautioned w/ lifting pt from floor vs safe floor transfer 2/2 safety risk of caregivers. Both verbalized understanding and in agreement. Returned to room and ended session in w/c and in care of family, all needs met.   Therapy Documentation Precautions:  Precautions Precautions: Fall Restrictions Weight Bearing Restrictions: No  See Function Navigator for Current Functional Status.   Therapy/Group: Individual Therapy  [name] K Arnette 02/11/2018, 11:57 AM

## 2018-02-11 NOTE — Progress Notes (Signed)
Occupational Therapy Session Note  Patient Details  Name: Brooke Chavez MRN: [id number] Date of Birth: 06/03/1941  Today's Date: 02/11/2018 OT Individual Time: 1300-1327 OT Individual Time Calculation (min): 27 min    Short Term Goals: Week 4:     Skilled Therapeutic Interventions/Progress Updates:    1:1. No c/o pain. Pt and daughter present throughout session to review BSC transfers from regular bed. Pt ambulates to/from all tx session with VC for steering RW/clearing R foot. Pt completes bed<>BSC transfer 2x with OT demonstrating proper supervision/cueing during first transfer and A to bring RLE onto bed. Daughter able to return demo, cuing appropriately and providing any needed steadying/A for RLE into bed. Pt completes 9HPT seated EOM RUE 1 min 9 seconds and LUE 39 seconds. Exited session with pt seated in w/c, call light in reach and all needs met.   Therapy Documentation Precautions:  Precautions Precautions: Fall Restrictions Weight Bearing Restrictions: No General:   Vital Signs:   See Function Navigator for Current Functional Status.   Therapy/Group: Individual Therapy  Stephanie M Schlosser 02/11/2018, 1:26 PM 

## 2018-02-12 ENCOUNTER — Inpatient Hospital Stay (HOSPITAL_COMMUNITY): Payer: Medicare Other

## 2018-02-12 ENCOUNTER — Inpatient Hospital Stay (HOSPITAL_COMMUNITY): Payer: Medicare Other | Admitting: Physical Therapy

## 2018-02-12 ENCOUNTER — Inpatient Hospital Stay (HOSPITAL_COMMUNITY): Payer: Medicare Other | Admitting: Speech Pathology

## 2018-02-12 MED ORDER — FERROUS SULFATE 325 (65 FE) MG PO TABS: 325.0000 mg | ORAL_TABLET | Freq: Every day | ORAL | 3 refills | Status: DC

## 2018-02-12 MED ORDER — POTASSIUM CHLORIDE CRYS ER 10 MEQ PO TBCR: 10.0000 meq | EXTENDED_RELEASE_TABLET | Freq: Every day | ORAL | 1 refills | Status: DC

## 2018-02-12 MED ORDER — ATORVASTATIN CALCIUM 40 MG PO TABS: 40.0000 mg | ORAL_TABLET | Freq: Every day | ORAL | 1 refills | Status: DC

## 2018-02-12 MED ORDER — HYDROCHLOROTHIAZIDE 12.5 MG PO CAPS: 12.5000 mg | ORAL_CAPSULE | Freq: Every day | ORAL | 1 refills | Status: DC

## 2018-02-12 MED ORDER — APIXABAN 5 MG PO TABS: 5.0000 mg | ORAL_TABLET | Freq: Two times a day (BID) | ORAL | 1 refills | Status: DC

## 2018-02-12 MED ORDER — ACETAMINOPHEN 325 MG PO TABS: 650.0000 mg | ORAL_TABLET | Freq: Four times a day (QID) | ORAL | Status: AC | PRN

## 2018-02-12 NOTE — Progress Notes (Signed)
Physical Therapy Discharge Summary  Patient Details  Name: Brooke Chavez MRN: [id number] Date of Birth: 05/10/1941  Today's Date: 02/12/2018 PT Individual Time: 1300-1400 PT Individual Time Calculation (min): 60 min   Pt in w/c and agreeable to therapy, denies pain. Session focused on performing functional mobility as detailed below including gait, furniture and car transfers, bed mobility w/o bed rails, and stairs. Pt emotional this session when therapist performing discharge assessment, specifically when asking questions that were yes/no questions but pt clearly has more to say but was unable 2/2 aphasia. Pt additionally emotional about her functional status. Per family report, pt was very independent and talkative before stroke and has been having periods of depressed moods since then. Provided encouragement to pt regarding her improvement and educated her on stroke recovery and continuing to improve once she leaves rehab. Pt appeared encouraged by discussion but tearful remainder of session and tearful with family members at end of session. Will make PA-C aware. Ended session in care of family, all needs met.   Patient has met 9 of 10 long term goals due to improved activity tolerance, improved balance, improved postural control, increased strength, ability to compensate for deficits, functional use of  right lower extremity, improved attention, improved awareness and improved coordination.  Patient to discharge at an ambulatory level Supervision.   Patient's care partner is independent to provide the necessary physical and cognitive assistance at discharge.  Reasons goals not met: Pt continues to require min assist for dynamic standing balance w/o UE support 2/2 R lateral lean w/ RLE fatigue.  Recommendation:  Patient will benefit from ongoing skilled PT services in home health setting to continue to advance safe functional mobility, address ongoing impairments in functional mobility, RLE strength  and proprioception, R attention, safety awareness and functional sequencing, and overall endurance, and minimize fall risk.  Equipment: transport chair for community distances  Reasons for discharge: treatment goals met and discharge from hospital  Patient/family agrees with progress made and goals achieved: Yes  PT Discharge Precautions/Restrictions Precautions Precautions: Fall Restrictions Weight Bearing Restrictions: No Pain Pain Assessment Pain Scale: 0-10 Pain Score: 0-No pain Vision/Perception  Perception Perception: Impaired Inattention/Neglect: Does not attend to right visual field;Does not attend to right side of body(improved since eval) Praxis Praxis: Impaired Praxis Impairment Details: Initiation;Motor planning;Ideation  Cognition Overall Cognitive Status: Impaired/Different from baseline Arousal/Alertness: Awake/alert Orientation Level: Oriented X4 Attention: Selective Sustained Attention: Appears intact Selective Attention: Impaired Memory: Impaired Awareness: Impaired Problem Solving: Impaired Safety/Judgment: Impaired Comments: decreased safety awareness and awareness of deficits Sensation Sensation Light Touch: Impaired by gross assessment(Difficult to assess 2/2 aphasia, pt confirms that sensation in RLE is diminished, she endorses both numbness and tingling that is intermittent) Proprioception Impaired Details: Impaired RLE Coordination Gross Motor Movements are Fluid and Coordinated: No Fine Motor Movements are Fluid and Coordinated: No Coordination and Movement Description: Apraxic Motor  Motor Motor: Hemiplegia;Motor apraxia;Ataxia Motor - Discharge Observations: Mild R hemi, generalized weakness, ataxia and apraxia both improved since eval  Mobility Bed Mobility Bed Mobility: Rolling Right;Rolling Left;Supine to Sit;Left Sidelying to Sit Rolling Right: Supervision/verbal cueing Rolling Left: Supervision/Verbal cueing Left Sidelying to  Sit: Supervision/Verbal cueing Supine to Sit: Supervision/Verbal cueing Sit to Supine: Supervision/Verbal cueing Transfers Transfers: Sit to Stand;Stand to Sit;Stand Pivot Transfers Sit to Stand: Supervision/Verbal cueing Stand to Sit: Supervision/Verbal cueing Stand Pivot Transfers: Supervision/Verbal cueing Locomotion  Gait Ambulation: Yes Gait Assistance: Supervision/Verbal cueing Gait Distance (Feet): 150 Feet Assistive device: Rolling walker Gait Gait: Yes Gait Pattern:  Impaired Gait Pattern: Decreased stance time - right;Decreased stride length;Decreased hip/knee flexion - right;Shuffle;Poor foot clearance - right Gait velocity: decreased Stairs / Additional Locomotion Stairs: Yes Stairs Assistance: Supervision/Verbal cueing Stair Management Technique: Two rails Number of Stairs: 4 Height of Stairs: 6 Wheelchair Mobility 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, forensic)  Trunk/Postural Assessment  Cervical Assessment Cervical Assessment: Within Functional Limits Thoracic Assessment Thoracic Assessment: Exceptions to WFL(forward head posture) Lumbar Assessment Lumbar Assessment: Within Functional Limits Postural Control Postural Control: Deficits on evaluation(delayed righting reactions)  Balance Balance Balance Assessed: Yes Standardized Balance Assessment Standardized Balance Assessment: Berg Balance Test Berg Balance Test Sit to Stand: Able to stand without using hands and stabilize independently Standing Unsupported: Able to stand safely 2 minutes Sitting with Back Unsupported but Feet Supported on Floor or Stool: Able to sit safely and securely 2 minutes Stand to Sit: Sits safely with minimal use of hands Transfers: Able to transfer safely, definite need of hands Standing Unsupported with Eyes Closed: Able to stand 10 seconds with supervision Standing Ubsupported with Feet Together: Needs help to attain position but able to stand for 30 seconds with feet  together From Standing, Reach Forward with Outstretched Arm: Reaches forward but needs supervision From Standing Position, Pick up Object from Floor: Able to pick up shoe, needs supervision From Standing Position, Turn to Look Behind Over each Shoulder: Turn sideways only but maintains balance Turn 360 Degrees: Needs close supervision or verbal cueing Standing Unsupported, Alternately Place Feet on Step/Stool: Needs assistance to keep from falling or unable to try Standing Unsupported, One Foot in Front: Needs help to step but can hold 15 seconds Standing on One Leg: Unable to try or needs assist to prevent fall Total Score: 31 Static Sitting Balance Static Sitting - Balance Support: No upper extremity supported;Feet supported Static Sitting - Level of Assistance: 5: Stand by assistance Dynamic Sitting Balance Dynamic Sitting - Balance Support: During functional activity;No upper extremity supported;Feet supported Dynamic Sitting - Level of Assistance: 5: Stand by assistance Static Standing Balance Static Standing - Balance Support: No upper extremity supported;During functional activity Static Standing - Level of Assistance: 4: Min assist Dynamic Standing Balance Dynamic Standing - Balance Support: During functional activity;No upper extremity supported Dynamic Standing - Level of Assistance: 4: Min assist Extremity Assessment  RLE Assessment RLE Assessment: Exceptions to WFL(hip flexoin 3+/5, otherwise 4+ to 5/5 globally) LLE Assessment LLE Assessment: Within Functional Limits   See Function Navigator for Current Functional Status.  Krishika Bugge K Arnette 02/12/2018, 2:06 PM

## 2018-02-12 NOTE — Progress Notes (Signed)
Occupational Therapy Discharge Summary  Patient Details  Name: Brooke Chavez MRN: [id number] Date of Birth: Jan 11, 1941   Patient has met 12 of 12 long term goals due to improved activity tolerance, improved balance, postural control, ability to compensate for deficits, functional use of  RIGHT upper extremity, improved attention, improved awareness and improved coordination.  Patient to discharge at overall close supervision-min A level as her performance level can fluctuate based on fatigue and attention to task..  Patient's care partner is independent to provide the necessary physical and cognitive assistance at discharge.  Pt's daughters who will serve as primary caregivers at d/c have completed hands on family education/training and voice willing and ableness to provide needed assist at d/c. 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   Recommendation:  Patient will benefit from ongoing skilled OT services in home health setting to continue to advance functional skills in the area of BADL and Reduce care partner burden.  Equipment: RW. Pt's family reports she has BSC and plan to privately purchase shower chair  Reasons for discharge: treatment goals met and discharge from hospital  Patient/family agrees with progress made and goals achieved: Yes  OT Discharge Precautions/Restrictions  Precautions Precautions: Fall Restrictions Weight Bearing Restrictions: No ADL ADL ADL Comments: Please see functional navigator Vision Baseline Vision/History: Wears glasses Wears Glasses: At all times Patient Visual Report: (Unable to assess due to expressive aphasia) Vision Assessment?: Vision impaired- to be further tested in functional context Visual Fields: No apparent deficits Perception  Perception: Impaired Inattention/Neglect: Does not attend to right visual field;Does not attend to right side of body Praxis Praxis:  Impaired Praxis Impairment Details: Initiation;Motor planning;Ideation Cognition Overall Cognitive Status: Impaired/Different from baseline(Difficult to assess 2/2 expressive aphasia) Arousal/Alertness: Awake/alert Problem Solving: Impaired Problem Solving Impairment: Verbal basic;Functional basic Safety/Judgment: Impaired Comments: decreased safety awareness and awareness of deficits Sensation Sensation Light Touch: Impaired Detail Light Touch Impaired Details: Absent RUE Proprioception: Impaired Detail Proprioception Impaired Details: Impaired RUE Coordination Gross Motor Movements are Fluid and Coordinated: No Fine Motor Movements are Fluid and Coordinated: No Coordination and Movement Description: Apraxic Motor  Motor Motor: Hemiplegia;Motor apraxia;Ataxia Mobility     Trunk/Postural Assessment  Cervical Assessment Cervical Assessment: Within Functional Limits Thoracic Assessment Thoracic Assessment: Exceptions to WFL(Kyphotic) Lumbar Assessment Lumbar Assessment: Exceptions to WFL(Posterior pelvic tilt) Postural Control Postural Control: Deficits on evaluation  Balance Balance Balance Assessed: Yes Static Sitting Balance Static Sitting - Balance Support: No upper extremity supported;Feet supported Static Sitting - Level of Assistance: 5: Stand by assistance Dynamic Sitting Balance Dynamic Sitting - Balance Support: No upper extremity supported;Feet supported Dynamic Sitting - Level of Assistance: 5: Stand by assistance;4: Min assist Static Standing Balance Static Standing - Balance Support: No upper extremity supported;During functional activity Static Standing - Level of Assistance: 5: Stand by assistance;4: Min assist Dynamic Standing Balance Dynamic Standing - Balance Support: During functional activity;No upper extremity supported Dynamic Standing - Level of Assistance: 5: Stand by assistance;4: Min assist Dynamic Standing - Comments: Standing during LB  bathing/dressing tasks Extremity/Trunk Assessment RUE Assessment RUE Assessment: Exceptions to [facility] General Strength Comments: Unable to formally assess due to cognition, able to use at functional level, ~4/5 strength RUE Body System: Neuro Brunstrum levels for arm and hand: Hand;Arm Brunstrum level for arm: Stage V Relative Independence from Synergy LUE Assessment LUE Assessment: Within Functional Limits   See Function Navigator for Current Functional Status.  Lynne Righi L 02/12/2018, 6:49 AM

## 2018-02-12 NOTE — Progress Notes (Signed)
Social Work  Discharge Note  The overall goal for the admission was met for: DC-SAT 7/6  Discharge location: Yes-HOME WITH DAUGHTER'S AND FAMILY PROVIDING 24 HR CARE  Length of Stay: Yes-17 DAYS  Discharge activity level: Yes-SUPERVISION-MIN ASSIST LEVEL  Home/community participation: Yes  Services provided included: MD, RD, PT, OT, SLP, RN, CM, Pharmacy, Neuropsych and SW  Financial Services: Medicare and Private Insurance: [employer]  Follow-up services arranged: Home Health: [facility]-PT,OT,SP, DME: [facility]-TRANSPORT CHAIR and Patient/Family has no preference for HH/DME agencies  Comments (or additional information):MANY FAMILY MEMBERS HAVE BEEN TRAINED AND AWARE OF PT'S NEED FOR 24 HR CARE. PT HAS NINE SISTER'S AND ALL HAVE BEEN HERE TO PARTICIPATE AND PROVIDE SUPPORT TO HER. ALL COMFORTABLE WITH DISCHARGE AND FEEL READY FOR DC TOMORROW.  Patient/Family verbalized understanding of follow-up arrangements: Yes  Individual responsible for coordination of the follow-up plan: NICOLE-DAUGHTER  Confirmed correct DME delivered: [name] 02/12/2018    [name]

## 2018-02-12 NOTE — Progress Notes (Signed)
Occupational Therapy Session Note  Patient Details  Name: Brooke Chavez MRN: [id number] Date of Birth: Jul 14, 1941  Today's Date: 02/12/2018 OT Individual Time: 8882-8003 OT Individual Time Calculation (min): 58 min    Short Term Goals: Week 2:  OT Short Term Goal 1 (Week 2): STG=LTG 2/2 ELOS  Skilled Therapeutic Interventions/Progress Updates:    Pt received sitting up in w/c with no reports of pain throughout session and agreeable to therapy. Session focused on safety and independence during b/d tasks, standing balance, and d/c planning. Pt used RW to ambulate into bathroom at (S) level. Pt doffed all clothing seated on toilet with min A for distal LB. Pt edu re fall risk when flexing too far forward. Pt completed bathing in shower from TTB with (S). B Teds were donned with total A for time management. Pt required min A for LB dressing to thread pants. Pt completed 150 ft of functional mobility with 3 seated rest breaks. Pt encouraged to independently request rest breaks when fatigue set in to prepare for d/c, but pt required vc for each break. Pt was returned to room and left sitting up in w/c with all needs met and QRB donned.   Therapy Documentation Precautions:  Precautions Precautions: Fall Restrictions Weight Bearing Restrictions: No  Therapy Vitals Pulse Rate: (!) 54 BP: (!) 148/59 Patient Position (if appropriate): Sitting Oxygen Therapy SpO2: 98 % O2 Device: Room Air Pain: Pain Assessment Pain Scale: 0-10 Pain Score: 0-No pain ADL: ADL ADL Comments: Please see functional navigator Vision Baseline Vision/History: No visual deficits Wears Glasses: At all times Patient Visual Report: Diplopia(difficult to assess 2/2 aphasia) Vision Assessment?: Vision impaired- to be further tested in functional context Perception  Perception: Impaired Inattention/Neglect: Does not attend to right visual field;Does not attend to right side of body(improved since eval) Praxis Praxis:  Impaired Praxis Impairment Details: Initiation;Motor planning;Ideation  See Function Navigator for Current Functional Status.   Therapy/Group: Individual Therapy  Curtis Sites 02/12/2018, 3:35 PM

## 2018-02-12 NOTE — Progress Notes (Signed)
Subjective/Complaints: Remains expressive> Receptive aphasia  Review of systems: Unable to obtain secondary to aphasia.  Objective: Vital Signs: Blood pressure 138/68, pulse 72, temperature 98.7 F (37.1 C), temperature source Oral, resp. rate 18, height _0  (1.6 m), weight 67.6 kg (149 lb 0.5 oz), last menstrual period 11/05/1992, SpO2 100 %. No results found. Results for orders placed or performed during the hospital encounter of 01/27/18 (from the past 72 hour(s))  Basic metabolic panel     Status: Abnormal   Collection Time: 02/10/18 12:47 PM  Result Value Ref Range   Sodium 142 135 - 145 mmol/L   Potassium 4.1 3.5 - 5.1 mmol/L   Chloride 103 98 - 111 mmol/L    Comment: Please note change in reference range.   CO2 25 22 - 32 mmol/L   Glucose, Bld 97 70 - 99 mg/dL    Comment: Please note change in reference range.   BUN 28 (H) 8 - 23 mg/dL    Comment: Please note change in reference range.   Creatinine, Ser 1.51 (H) 0.44 - 1.00 mg/dL   Calcium 9.8 8.9 - 10.3 mg/dL   GFR calc non Af Amer 32 (L) >60 mL/min   GFR calc Af Amer 37 (L) >60 mL/min    Comment: (NOTE) The eGFR has been calculated using the CKD EPI equation. This calculation has not been validated in all clinical situations. eGFR's persistently <60 mL/min signify possible Chronic Kidney Disease.    Anion gap 14 5 - 15    Comment: Performed at [facility] [id number], [place], [place] [id number]     Constitutional: No distress . Vital signs reviewed. HENT: Normocephalic.  Atraumatic. Eyes: EOMI. No discharge. Cardiovascular: RRR. No JVD. Respiratory: CTA Bilaterally. Normal effort. GI: BS +. Non-distended. Musc: No edema or tenderness in extremities. Neuro: Alert Global aphasia Limited ability to follow commands, ?4/5 throughout (unchanged) Psych: Unable to assess due to aphasia  Assessment/Plan: 1. 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 Physiatrist is providing close team supervision and 24 hour management of active medical problems listed below. Physiatrist and rehab team continue to assess barriers to discharge/monitor patient progress toward functional and medical goals. FIM: Function - Bathing Position: Shower Body parts bathed by patient: Right arm, Left arm, Chest, Left upper leg, Right upper leg, Front perineal area, Abdomen, Buttocks, Right lower leg, Left lower leg Body parts bathed by helper: Back Assist Level: Touching or steadying assistance(Pt > 75%)  Function- Upper Body Dressing/Undressing What is the patient wearing?: Pull over shirt/dress Pull over shirt/dress - Perfomed by patient: Thread/unthread right sleeve, Thread/unthread left sleeve, Put head through opening, Pull shirt over trunk Pull over shirt/dress - Perfomed by helper: Pull shirt over trunk, Thread/unthread right sleeve Assist Level: Supervision or verbal cues Function - Lower Body Dressing/Undressing What is the patient wearing?: Pants, Shoes, AFO Position: Wheelchair/chair at sink Pants- Performed by patient: Thread/unthread right pants leg, Thread/unthread left pants leg, Pull pants up/down Pants- Performed by helper: Thread/unthread right pants leg Non-skid slipper socks- Performed by patient: Don/doff left sock Non-skid slipper socks- Performed by helper: Don/doff right sock, Don/doff left sock Socks - Performed by patient: Don/doff left sock, Don/doff right sock Socks - Performed by helper: Don/doff right sock Shoes - Performed by patient: Don/doff right shoe, Don/doff left shoe, Fasten right, Fasten left Shoes - Performed by helper: Don/doff right  shoe AFO - Performed by helper: Don/doff right AFO TED Hose - Performed by helper: Don/doff right TED hose, Don/doff left TED hose Assist for footwear: Supervision/touching assist Assist for lower body  dressing: Touching or steadying assistance (Pt > 75%)  Function - Toileting Toileting steps completed by patient: Performs perineal hygiene Toileting steps completed by helper: Adjust clothing prior to toileting, Adjust clothing after toileting Toileting Assistive Devices: Grab bar or rail Assist level: Touching or steadying assistance (Pt.75%)  Function - Air cabin crew transfer activity did not occur: Safety/medical concerns Toilet transfer assistive device: Grab bar, Environmental manager lift: Stedy Assist level to toilet: Touching or steadying assistance (Pt > 75%) Assist level from toilet: Touching or steadying assistance (Pt > 75%)  Function - Chair/bed transfer Chair/bed transfer method: Ambulatory Chair/bed transfer assist level: Supervision or verbal cues Chair/bed transfer assistive device: Armrests, Walker Chair/bed transfer details: Verbal cues for technique, Verbal cues for precautions/safety, Manual facilitation for weight shifting  Function - Locomotion: Wheelchair Will patient use wheelchair at discharge?: Yes(TBD) Type: Manual Max wheelchair distance: 25' Assist Level: Total assistance (Pt < 25%) Wheel 50 feet with 2 turns activity did not occur: Safety/medical concerns Wheel 150 feet activity did not occur: Safety/medical concerns Turns around,maneuvers to table,bed, and toilet,negotiates 3% grade,maneuvers on rugs and over doorsills: No Function - Locomotion: Ambulation Assistive device: Walker-rolling Max distance: 150' Assist level: Touching or steadying assistance (Pt > 75%) Assist level: Touching or steadying assistance (Pt > 75%) Walk 50 feet with 2 turns activity did not occur: Safety/medical concerns Assist level: Touching or steadying assistance (Pt > 75%) Walk 150 feet activity did not occur: Safety/medical concerns Assist level: Touching or steadying assistance (Pt > 75%) Walk 10 feet on uneven surfaces activity did not occur: Safety/medical  concerns  Function - Comprehension Comprehension: Auditory Comprehension assist level: Understands basic 50 - 74% of the time/ requires cueing 25 - 49% of the time  Function - Expression Expression: Verbal Expression assist level: Expresses basic 25 - 49% of the time/requires cueing 50 - 75% of the time. Uses single words/gestures.  Function - Social Interaction Social Interaction assist level: Interacts appropriately 75 - 89% of the time - Needs redirection for appropriate language or to initiate interaction.  Function - Problem Solving Problem solving assist level: Solves basic 75 - 89% of the time/requires cueing 10 - 24% of the time  Function - Memory Memory assist level: Recognizes or recalls 75 - 89% of the time/requires cueing 10 - 24% of the time Patient normally able to recall (first 3 days only): That he or she is in a hospital, Staff names and faces, Current season, Location of own room  Medical Problem List and Plan: 1.Right hemiparesis with expressive aphasiasecondary to left MCA infarction  Cont CIR PT,  OT, SLP plan d/c 7/6 2. DVT Prophylaxis/Anticoagulation: SCDs.   Head CT on 6/23 reviewed, showing persistent hemorrhagic transformation.  Pt started on Eliquis  No sign of bleeding   3. Pain Management:Hydrocodone as needed 4. Mood:Provide emotional support 5. Neuropsych: This patientis notcapable of making decisions on herown behalf. 6. Skin/Wound Care:Routine skin checks 7. 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 Follow-up cardiology services. Chronic amiodarone on hold as patient's cardiac rate controlled. CHRONIC COUMADINcurrently on hold due to high risk of hemorrhage after recent CVA  9.Chronic diastolic congestive heart failure. Monitor for any signs of fluid overload Filed Weights   02/09/18 0428 02/10/18 0516 02/11/18 0444  Weight: 73.5 kg (  162 lb) 67.8 kg (149 lb 7.6 oz) 67.6  kg (149 lb 0.5 oz)   Weights going down on diuretics 10.Hypertension. Patient on amiodarone 200 mg daily, HCTZ 25 mg daily prior to admission. Resume as needed Vitals:   02/11/18 0444 02/11/18 1443  BP: (!) 147/72 138/68  Pulse: 72 72  Resp: 18 18  Temp: 98.8 F (37.1 C) 98.7 F (37.1 C)  SpO2: 98% 100%   Controlled 7/5 Chlorthalidone 25 started on 6/25, Increased to 50 on 6/27 Took HCTZ at home and has meds at home will change back   good range need ongoing K supplements 11.Hyperlipidemia. Lipitor 12.  Hx Afib but amiodarone held per cardiology due to junctional rythmn, asymptomatic 13. CKD 3a  Creatinine 1.27 on 6/30, recheck BMET, Creat 1.5, GFR 37 inc fluids, likely related to diuretic    Encourage fluids, I 673m yesterday , improved  Cont to monitor 14. 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 Acute blood loss anemia  Hemoglobin 9.9 on 6/26  Continue to monitor  17. Acute lower UTI  UA +, U culture with multiple species  Empiric Macrobid started on 6/27 - 7/4 18. Hypokalemia  Potassium 3.8 on 6/30  Supplemented 2 days  LOS (Days) 16 A FACE TO FACE EVALUATION WAS PERFORMED  ACharlett Blake7/12/2017, 8:03 AM

## 2018-02-12 NOTE — Plan of Care (Signed)
  Problem: Consults Goal: RH STROKE PATIENT EDUCATION Description See Patient Education module for education specifics  Outcome: Progressing   Problem: RH BOWEL ELIMINATION Goal: RH STG MANAGE BOWEL WITH ASSISTANCE Description STG Manage Bowel with moderate Assistance.  Outcome: Progressing   Problem: RH BLADDER ELIMINATION Goal: RH STG MANAGE BLADDER WITH ASSISTANCE Description STG Manage Bladder With moderate Assistance  Outcome: Progressing   Problem: RH SKIN INTEGRITY Goal: RH STG SKIN FREE OF INFECTION/BREAKDOWN Description Skin to remain free from infection or breakdown while on rehab with min assist  Outcome: Progressing Goal: RH STG MAINTAIN SKIN INTEGRITY WITH ASSISTANCE Description STG Maintain Skin Integrity With min Assistance.  Outcome: Progressing   Problem: RH SAFETY Goal: RH STG ADHERE TO SAFETY PRECAUTIONS W/ASSISTANCE/DEVICE Description STG Adhere to Safety Precautions With moderate Assistance and appropriate assistive Device.  Outcome: Progressing Goal: RH STG DECREASED RISK OF FALL WITH ASSISTANCE Description STG Decreased Risk of Fall With moderate Assistance.  Outcome: Progressing

## 2018-02-12 NOTE — Discharge Summary (Signed)
Discharge summary job [phone]

## 2018-02-12 NOTE — Progress Notes (Signed)
Social Work Patient ID: Brooke Chavez, female   DOB: [birth year], 77 y.o.   MRN: [id number]  Family education completed yesterday with daughter's it went very well and all ready for discharge tomorrow. Will have home health therapies then transition to OP therapies.

## 2018-02-12 NOTE — Discharge Summary (Signed)
NAMEWILLO, Brooke Chavez MEDICAL RECORD YO:[id number] ACCOUNT [id number] DATE OF BIRTH:Oct 20, 1940 FACILITY: [facility] LOCATION: [facility] PHYSICIAN:ANDREW Letta Pate, MD  DISCHARGE SUMMARY  DATE OF DISCHARGE:  02/13/2018  DATE OF ADMISSION:  01/27/2018   DATE OF DISCHARGE:  02/13/2018   DISCHARGE DIAGNOSES: 1.  Left middle cerebral artery infarction. 2.  Deep venous thrombosis prophylaxis with Eliquis. 3.  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 4.  Chronic diastolic congestive heart failure. 5.  Hypertension. 6.  Hyperlipidemia. 7.  History of atrial fibrillation. 8.  Chronic kidney disease stage III. 9.  Acute blood loss anemia. 10.  Acute lower urinary tract infection.  11.  Hypokalemia, resolved.  HOSPITAL COURSE:  A 77 year old right-handed female with complex medical history with mitral valve disease, atrial flutter status post mitral valve repair 01/07/2018 per Dr. Roxy Manns.  She was discharged on Coumadin 01/14/2018 as well as history of  hypertension, chronic diastolic congestive heart failure.  She presented to [facility] on [facility]/24/2018 after being found down by family with right-sided weakness and aphasia.  She lives with her daughter.  Reported to be independent prior to  mitral valve repair.  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  She was discharged to [facility] for ongoing care.   INR upon admission of 1.7.  CT angiogram of head and neck showed left proximal M1 occlusion with thrombus measuring 8 mm in length.  Interventional radiology consulted.  No plan for intervention due to high risk of bleeding.  Echocardiogram with  ejection fraction of 55%.  Systolic function normal.  No PFO.  MRI of the brain showed large left MCA territory infarction with petechial hemorrhage, old left cerebellar infarction.  Cardiology service is  consulted.  No plan for TEE.  Neurology service  follow up with the patient had been on Coumadin therapy.  Await plan for ongoing anticoagulation.  Followup CT scan showing no signs of hemorrhagic transformation.  Physical and occupational therapy completed.  The patient was admitted for comprehensive  rehabilitation program.  PAST MEDICAL HISTORY:  See discharge diagnoses.  SOCIAL HISTORY:  Lives with daughter.  Reported to be independent prior to admission.  FUNCTIONAL STATUS:  Upon admission to rehab services was +2 physical assist sit to stand, moderate assist supine to sit, max total assist with activities of daily living.  PHYSICAL EXAMINATION: VITAL SIGNS:  Blood pressure 127/61, pulse 78, temperature 98, respirations 17. GENERAL:  Alert female.  Noted expressive aphasia.  She does respond appropriately with head nods.   HEENT:  EOMs intact. NECK:  Supple, nontender, no JVD. HEART:  Irregularly irregular. ABDOMEN:  Soft, nontender, good bowel sounds. LUNGS:  Clear to auscultation without wheeze.  REHABILITATION HOSPITAL COURSE:  The patient was admitted to inpatient rehabilitation services.  Therapies initiated on a 3-hour daily basis, consisting of physical therapy, occupational therapy, speech therapy and rehabilitation nursing.  The following  issues were addressed during patient's rehabilitation stay.  Pertaining to the patient's left MCA infarction, remained stable.  She would follow up with neurology services.  Complicated history with noted mitral valve repair 01/07/2018.  She had been on  chronic Coumadin.  INR 1.7 on latest hospital admission.  Transition as discussed with cardiology services, neurology services and cardiothoracic surgery.  Simplified care to begin Eliquis in place of Coumadin therapy.  All issues in regards of this were  discussed with family.  There were no  other bleeding episodes.  Her amiodarone remains on hold at the recommendations of cardiology services  as documented maintaining S bradycardia with significant first-degree AV block and would follow-up outpatient with cardiology services.  She exhibited no other signs of fluid overload.  Blood pressure is controlled with HCTZ.  CKD stage III.  Creatinine stable at 1.5.  Close monitoring while on low dose diuretic.  Acute on  chronic anemia 9.9.  She did complete a course of antibiotic for multispecies UTI remaining afebrile.  The patient received weekly collaborative interdisciplinary team conferences to discuss estimated length of stay, family teaching, any barriers to her discharge.  Performed ambulation stairs and transfers with supervision minimal guard level assist.   Ongoing family teaching.  Working with floor transfers; however, the patient did need assistance due to safety.  Sessions focused on overall endurance, strengthening.  She can perform bimanual tasks and standing to mimic standing up at sink side and  countertop with minimal cues, again working with endurance with ambulation.  She can ambulate up to 100 feet at a time with rest breaks.  Activities of daily living and homemaking, completed dressing from wheelchair level standing with a rolling walker  during clothing management.  Again, ongoing family education and discussion of safety.  Speech therapy followup for her aphasia.  She was able to communicate her simple needs.  She was tolerating a mechanical soft diet.  Full family teaching was  completed and planned discharge to home.  DISCHARGE MEDICATIONS:  Included Eliquis 5 mg p.o. b.i.d., Lipitor 40 mg p.o. at bedtime, HCTZ 12.5 mg p.o. daily, potassium chloride 10 mEq p.o. daily.  Her diet was mechanical soft.  She would follow up with Dr. Alysia Penna, Outpatient Rehabilitation Services as directed; Dr. Antony Contras, call for appointment; Dr. [name], cardiology service; Dr. Darylene Price, call for appointment;  Mary-Margaret Hassell Done, medical management.  TN/NUANCE  D:02/12/2018 T:02/12/2018 JOB:[id number]

## 2018-02-12 NOTE — Progress Notes (Signed)
Speech Language Pathology Discharge Summary  Patient Details  Name: Brooke Chavez MRN: [id number] Date of Birth: [birth year]  Today's Date: 02/12/2018 SLP Individual Time: 0830-0930 SLP Individual Time Calculation (min): 60 min   Skilled Therapeutic Interventions:   Skilled treatment session focused on cognitive-linguistic goals. SLP facilitated session by providing extra time and overall supervision verbal cues for problem solving during oral care. Patient answered basic yes/no questions with 70% accuracy and complex yes/n oquestons with 50% accuracy. Patient was unable to follow abstract 1 step commands but can follow commands within a context with Min verbal and visual cues. Patient named functional items with Max A multimodal cues with use of MIT. Patient left upright in wheelchair with alarm on and all needs within reach. Continue with current plan of care.   Patient has met 4 of 4 long term goals.  Patient to discharge at overall Mod;Max level.   Reasons goals not met: N/A   Clinical Impression/Discharge Summary: Patient has met 4 of 4 LTG's this admission. Patient has made minimal gains in regards to functional communication but demonstrates improved auditory comprehension of basic information, problem solving and ability to naming functional objects with use of strategies. Patient benefits from melodic intonation therapy, sentence completion cues and articulatory cues to maximize verbal expression due to severe apraxia and aphasia. Patient is consuming Dys. 3 textures with thin liquids with minimal overt s/s of aspiration but requires supervision verbal cues for use of a slow rate and to clear buccal pocketing. Patient and family education is complete and patient will discharge home with 24 hour supervision. Patient would benefit from f/u SLP services to maximize her cognitive-linguistic and swallowing function in order to reduce caregiver burden.   Care Partner:  Caregiver Able to Provide  Assistance: Yes  Type of Caregiver Assistance: Physical;Cognitive  Recommendation:  Outpatient SLP;24 hour supervision/assistance  Rationale for SLP Follow Up: Reduce caregiver burden;Maximize functional communication;Maximize cognitive function and independence;Maximize swallowing safety   Equipment: N/A   Reasons for discharge: Treatment goals met;Discharged from hospital   Patient/Family Agrees with Progress Made and Goals Achieved: Yes   Function:   Cognition Comprehension Comprehension assist level: Understands basic 50 - 74% of the time/ requires cueing 25 - 49% of the time  Expression   Expression assist level: Expresses basic 25 - 49% of the time/requires cueing 50 - 75% of the time. Uses single words/gestures.  Social Interaction Social Interaction assist level: Interacts appropriately 75 - 89% of the time - Needs redirection for appropriate language or to initiate interaction.  Problem Solving Problem solving assist level: Solves basic 75 - 89% of the time/requires cueing 10 - 24% of the time  Memory Memory assist level: Recognizes or recalls 75 - 89% of the time/requires cueing 10 - 24% of the time   Esteban Kobashigawa 02/12/2018, 4:15 PM

## 2018-02-13 NOTE — Progress Notes (Signed)
Brooke Chavez is a 77 y.o. female Jan 07, 1941 [id number]  Subjective: No new complaints. No new problems. Slept well. Feeling OK.  Objective: Vital signs in last 24 hours: Temp:  [98.8 F (37.1 C)-99.2 F (37.3 C)] 99.1 F (37.3 C) (07/06 0442) Pulse Rate:  [54-78] 78 (07/06 0442) Resp:  [18] 18 (07/06 0442) BP: (127-161)/(59-76) 127/76 (07/06 0442) SpO2:  [98 %-99 %] 99 % (07/06 0442) Weight:  [153 lb (69.4 kg)] 153 lb (69.4 kg) (07/06 0517) Weight change:  Last BM Date: 02/10/18  Intake/Output from previous day: 07/05 0701 - 07/06 0700 In: 180 [P.O.:180] Out: -  Last cbgs: CBG (last 3)  No results for input(s): GLUCAP in the last 72 hours.   Physical Exam General: No apparent distress   HEENT: not dry Lungs: Normal effort. Lungs clear to auscultation, no crackles or wheezes. Cardiovascular: Regular rate and rhythm, no edema Abdomen: S/NT/ND; BS(+) Musculoskeletal:  unchanged Neurological: No new neurological deficits Wounds: N/A    Skin: clear  Aging changes Mental state: Alert, aphasic    Lab Results: BMET    Component Value Date/Time   NA 142 02/10/2018 1247   NA 142 11/03/2017 1507   K 4.1 02/10/2018 1247   CL 103 02/10/2018 1247   CO2 25 02/10/2018 1247   GLUCOSE 97 02/10/2018 1247   BUN 28 (H) 02/10/2018 1247   BUN 23 11/03/2017 1507   CREATININE 1.51 (H) 02/10/2018 1247   CREATININE 1.14 (H) 12/07/2012 1655   CALCIUM 9.8 02/10/2018 1247   GFRNONAA 32 (L) 02/10/2018 1247   GFRNONAA 48 (L) 12/07/2012 1655   GFRAA 37 (L) 02/10/2018 1247   GFRAA 56 (L) 12/07/2012 1655   CBC    Component Value Date/Time   WBC 7.9 02/03/2018 0508   RBC 3.22 (L) 02/03/2018 0508   HGB 9.9 (L) 02/03/2018 0508   HGB 14.2 09/03/2017 1509   HCT 31.3 (L) 02/03/2018 0508   HCT 42.9 09/03/2017 1509   PLT 268 02/03/2018 0508   PLT 250 09/03/2017 1509   MCV 97.2 02/03/2018 0508   MCV 95 09/03/2017 1509   MCH 30.7 02/03/2018 0508   MCHC 31.6 02/03/2018 0508   RDW 13.7  02/03/2018 0508   RDW 14.6 09/03/2017 1509   LYMPHSABS 1.4 02/03/2018 0508   MONOABS 0.7 02/03/2018 0508   EOSABS 0.4 02/03/2018 0508   BASOSABS 0.1 02/03/2018 0508    Studies/Results: No results found.  Medications: I have reviewed the patient's current medications.  Assessment/Plan:   1. S/p Right hemiparesis with expressive aphasiasecondary to left MCA infarction. Finished CIR - d/c home. 2. A fib 3. S/p MVR 4. CKD 3a 5. CHF - monitor fluid balance 6. Aphasia 7. Dysphagia - on D3 diet  Length of stay, days: Narcissa , MD 02/13/2018, 8:34 AM

## 2018-02-13 NOTE — Plan of Care (Signed)
  Problem: Consults Goal: RH STROKE PATIENT EDUCATION Description See Patient Education module for education specifics  Outcome: Progressing   Problem: RH BOWEL ELIMINATION Goal: RH STG MANAGE BOWEL WITH ASSISTANCE Description STG Manage Bowel with moderate Assistance.  Outcome: Progressing   Problem: RH BLADDER ELIMINATION Goal: RH STG MANAGE BLADDER WITH ASSISTANCE Description STG Manage Bladder With moderate Assistance  Outcome: Progressing   Problem: RH SKIN INTEGRITY Goal: RH STG SKIN FREE OF INFECTION/BREAKDOWN Description Skin to remain free from infection or breakdown while on rehab with min assist  Outcome: Progressing Goal: RH STG MAINTAIN SKIN INTEGRITY WITH ASSISTANCE Description STG Maintain Skin Integrity With min Assistance.  Outcome: Progressing   Problem: RH SAFETY Goal: RH STG ADHERE TO SAFETY PRECAUTIONS W/ASSISTANCE/DEVICE Description STG Adhere to Safety Precautions With moderate Assistance and appropriate assistive Device.  Outcome: Progressing Goal: RH STG DECREASED RISK OF FALL WITH ASSISTANCE Description STG Decreased Risk of Fall With moderate Assistance.  Outcome: Progressing

## 2018-02-15 ENCOUNTER — Telehealth: Payer: Self-pay | Admitting: Registered Nurse

## 2018-02-15 ENCOUNTER — Ambulatory Visit: Payer: Medicare Other | Admitting: Nurse Practitioner

## 2018-02-15 NOTE — Telephone Encounter (Signed)
Transitional Care call Transitional Care Call Questions Answered by Sister: [name]  Patient name: Brooke Chavez DOB: 11/09/1940 1. Are you/is patient experiencing any problems since coming home? No a. Are there any questions regarding any aspect of care? No 2. Are there any questions regarding medications administration/dosing? No a. Are meds being taken as prescribed? Yes b. "Patient should review meds with caller to confirm"  3. Have there been any falls? No 4. Has Home Health been to the house and/or have they contacted you? No a. If not, have you tried to contact them? Ms. [name] states her niece was going to call [facility] today. b. Can we help you contact them? Yes, this provider will place a call to [facility].  5. Are bowels and bladder emptying properly? Yes a. Are there any unexpected incontinence issues? No b. If applicable, is patient following bowel/bladder programs? NA 6. Any fevers, problems with breathing, unexpected pain? No 7. Are there any skin problems or new areas of breakdown? No 8. Has the patient/family member arranged specialty MD follow up (ie cardiology/neurology/renal/surgical/etc.)?  Ms. [name] reports her niece has made [name] scheduled follow up appointments.  a. Can we help arrange? NA 9. Does the patient need any other services or support that we can help arrange? No 10. Are caregivers following through as expected in assisting the patient? Yes 11. Has the patient quit smoking, drinking alcohol, or using drugs as recommended? Ms. [name] states [name] doesn't smoke, drink alcohol or use illicit drugs.   Appointment date/time 02/26/2018, arrival time 12:45 for 1:15 appointment with Dr. Letta Pate. At [facility]

## 2018-02-16 ENCOUNTER — Telehealth: Payer: Self-pay | Admitting: Nurse Practitioner

## 2018-02-16 DIAGNOSIS — N183 Chronic kidney disease, stage 3 (moderate): Secondary | ICD-10-CM | POA: Diagnosis not present

## 2018-02-16 DIAGNOSIS — I130 Hypertensive heart and chronic kidney disease with heart failure and stage 1 through stage 4 chronic kidney disease, or unspecified chronic kidney disease: Secondary | ICD-10-CM | POA: Diagnosis not present

## 2018-02-16 DIAGNOSIS — I480 Paroxysmal atrial fibrillation: Secondary | ICD-10-CM | POA: Diagnosis not present

## 2018-02-16 DIAGNOSIS — I69351 Hemiplegia and hemiparesis following cerebral infarction affecting right dominant side: Secondary | ICD-10-CM | POA: Diagnosis not present

## 2018-02-16 DIAGNOSIS — I69320 Aphasia following cerebral infarction: Secondary | ICD-10-CM | POA: Diagnosis not present

## 2018-02-16 DIAGNOSIS — I5032 Chronic diastolic (congestive) heart failure: Secondary | ICD-10-CM | POA: Diagnosis not present

## 2018-02-16 NOTE — Telephone Encounter (Signed)
appt scheduled for 03/02/2018 Pt's daughter wanted to wait to schedule due to multiple specialists appts

## 2018-02-17 ENCOUNTER — Encounter: Payer: Self-pay | Admitting: Cardiology

## 2018-02-17 ENCOUNTER — Ambulatory Visit (INDEPENDENT_AMBULATORY_CARE_PROVIDER_SITE_OTHER): Payer: Medicare Other | Admitting: Cardiology

## 2018-02-17 VITALS — BP 138/72 | HR 81 | Wt 147.0 lb

## 2018-02-17 DIAGNOSIS — Z9889 Other specified postprocedural states: Secondary | ICD-10-CM | POA: Diagnosis not present

## 2018-02-17 DIAGNOSIS — I63512 Cerebral infarction due to unspecified occlusion or stenosis of left middle cerebral artery: Secondary | ICD-10-CM

## 2018-02-17 DIAGNOSIS — I480 Paroxysmal atrial fibrillation: Secondary | ICD-10-CM

## 2018-02-17 DIAGNOSIS — I639 Cerebral infarction, unspecified: Secondary | ICD-10-CM | POA: Diagnosis not present

## 2018-02-17 DIAGNOSIS — I69351 Hemiplegia and hemiparesis following cerebral infarction affecting right dominant side: Secondary | ICD-10-CM | POA: Diagnosis not present

## 2018-02-17 DIAGNOSIS — I10 Essential (primary) hypertension: Secondary | ICD-10-CM | POA: Diagnosis not present

## 2018-02-17 DIAGNOSIS — N183 Chronic kidney disease, stage 3 (moderate): Secondary | ICD-10-CM | POA: Diagnosis not present

## 2018-02-17 DIAGNOSIS — I130 Hypertensive heart and chronic kidney disease with heart failure and stage 1 through stage 4 chronic kidney disease, or unspecified chronic kidney disease: Secondary | ICD-10-CM | POA: Diagnosis not present

## 2018-02-17 DIAGNOSIS — I69320 Aphasia following cerebral infarction: Secondary | ICD-10-CM | POA: Diagnosis not present

## 2018-02-17 DIAGNOSIS — I5032 Chronic diastolic (congestive) heart failure: Secondary | ICD-10-CM | POA: Diagnosis not present

## 2018-02-17 NOTE — Progress Notes (Signed)
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 In May 2019 it was decided she needed vale replacement and on 01/01/18 she underwent minimally invasive MV repair, MAZE, and closure of PFO. She had been on Eliquis pre op, Coumadin was started post op. She was discharged 01/14/18. On 01/24/18 she presented to an outside hospital with a left brain stroke. Her INR on presentation was 1.7. She was transferred to [facility] for consideration of intervention but it was decided not to proceed with that when she arrived. She was discharge to in patient Rehab. It was noted she had a junctional rhythm and her Amiodarone (200 mg daily) was discontinued. She did make some strides in Rehab and was discharged 02/12/18. It was decided to resume her Eliquis and not start her on Coumadin at discharge.  She is in the office today with her daughter for follow up. The pt is in a wheel chair. She has some obvious dysarthria. Her daughter indicates the patient has been doing well, working with PT since discharge. No unusual tachycardia or dyspnea.    Current Outpatient Medications  Medication Sig Dispense Refill  . acetaminophen (TYLENOL) 325 MG tablet Take 2 tablets (650 mg total) by mouth every 6 (six) hours as needed for mild pain (or Fever >/= 101).    Marland Kitchen apixaban (ELIQUIS) 5 MG TABS tablet Take 1 tablet (5 mg total) by mouth 2 (two) times daily. 60 tablet 1  . atorvastatin (LIPITOR) 40 MG tablet Take 1 tablet (40 mg total) by mouth at bedtime. 90 tablet 1  . ferrous sulfate 325 (65 FE) MG tablet Take 1 tablet (325 mg total) by mouth daily with breakfast. For one month then stop. 30 tablet 3  . hydrochlorothiazide (MICROZIDE) 12.5 MG capsule Take 1 capsule (12.5 mg total) by mouth daily. 30 capsule 1  . potassium chloride (K-DUR,KLOR-CON) 10 MEQ  tablet Take 1 tablet (10 mEq total) by mouth daily. 30 tablet 1   No current facility-administered medications for this visit.     Allergies  Allergen Reactions  . Oxycodone-Acetaminophen Anxiety and Other (See Comments)    Hallucinations  . Aspirin Nausea And Vomiting    Past Medical History:  Diagnosis Date  . Atrial flutter ([place])    Typical by EKG diagnosis 9/11 s/p CIT ablation 11/11  . Bradycardia   . Chronic diastolic congestive heart failure ([place])   . DJD (degenerative joint disease)   . Dysrhythmia   . Femur fracture, left ([place]) 2013  . Heart murmur   . Hyperlipidemia    x5 years  . Hypertension    Since 1997  . Liver masses 11/13/2017   Multiple small nodules seen on CT and MRI  . Mitral regurgitation    severe  . 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 TR (tricuspid regurgitation)    Mild with RA enlargment    Social History   Socioeconomic History  . Marital status: Widowed    Spouse name: Not on file  . Number of children: Not on file  . Years of education: Not on file  . Highest education level: Not on file  Occupational  History  . Not on file  Social Needs  . Financial resource strain: Not on file  . Food insecurity:    Worry: Not on file    Inability: Not on file  . Transportation needs:    Medical: Not on file    Non-medical: Not on file  Tobacco Use  . Smoking status: Never Smoker  . Smokeless tobacco: Never Used  Substance and Sexual Activity  . Alcohol use: No  . Drug use: No  . Sexual activity: Never  Lifestyle  . Physical activity:    Days per week: Not on file    Minutes per session: Not on file  . Stress: Not on file  Relationships  . Social connections:    Talks on phone: Not on file    Gets together: Not on file    Attends  religious service: Not on file    Active member of club or organization: Not on file    Attends meetings of clubs or organizations: Not on file    Relationship status: Not on file  . Intimate partner violence:    Fear of current or ex partner: Not on file    Emotionally abused: Not on file    Physically abused: Not on file    Forced sexual activity: Not on file  Other Topics Concern  . Not on file  Social History Narrative   Lives in [place]   Her daughter lives with her     Family History  Problem Relation Age of Onset  . Hypertension Mother   . Cancer Sister        leukemia  . Diabetes Brother   . Stroke Sister   . Diabetes Brother   . Heart attack Brother   . Healthy Daughter   . Healthy Son   . Healthy Daughter      Review of Systems: General: negative for chills, fever, night sweats or weight changes.  Cardiovascular: negative for chest pain, dyspnea on exertion, edema, orthopnea, palpitations, paroxysmal nocturnal dyspnea or shortness of breath Dermatological: negative for rash Respiratory: negative for cough or wheezing Urologic: negative for hematuria Abdominal: negative for nausea, vomiting, diarrhea, bright red blood per rectum, melena, or hematemesis Neurologic: negative for visual changes, syncope, or dizziness All other systems reviewed and are otherwise negative except as noted above.    Blood pressure 138/72, pulse 81, weight 147 lb (66.7 kg), last menstrual period 11/05/1992.  General appearance: alert, cooperative, no distress and in wheel chair Neck: no carotid bruit and no JVD Lungs: clear to auscultation bilaterally and decreased at bases Heart: regular rate and rhythm and soft systolic murmur Extremities: brace on RLE, no edema Skin: Skin color, texture, turgor normal. 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 I will ask Dr Percival Spanish about resuming low dose Amiodarone -100mg  daily- since she is in NSR now.   Kerin Ransom PA-C 02/17/2018 11:08 AM

## 2018-02-17 NOTE — Patient Instructions (Signed)
NO CHANGES WITH MEDICATIONS     Your physician recommends that you schedule a follow-up appointment in [place].     If you need a refill on your cardiac medications before your next appointment, please call your pharmacy.

## 2018-02-17 NOTE — Assessment & Plan Note (Signed)
S/p EPS/RFA 06/27/11 Amiodarone stopped June 2019- junctional rhythm Pt is on Eliquis s/p MV repair, s/p embolic stroke

## 2018-02-17 NOTE — Assessment & Plan Note (Signed)
Minimally invasive MV repair with ring, MAZE, and closure of PFO 01/01/18

## 2018-02-17 NOTE — Assessment & Plan Note (Signed)
LMCA stroke post MV repair  01/24/18 (INR 1.7)

## 2018-02-19 DIAGNOSIS — I5032 Chronic diastolic (congestive) heart failure: Secondary | ICD-10-CM | POA: Diagnosis not present

## 2018-02-19 DIAGNOSIS — I130 Hypertensive heart and chronic kidney disease with heart failure and stage 1 through stage 4 chronic kidney disease, or unspecified chronic kidney disease: Secondary | ICD-10-CM | POA: Diagnosis not present

## 2018-02-19 DIAGNOSIS — N183 Chronic kidney disease, stage 3 (moderate): Secondary | ICD-10-CM | POA: Diagnosis not present

## 2018-02-19 DIAGNOSIS — I69351 Hemiplegia and hemiparesis following cerebral infarction affecting right dominant side: Secondary | ICD-10-CM | POA: Diagnosis not present

## 2018-02-19 DIAGNOSIS — I480 Paroxysmal atrial fibrillation: Secondary | ICD-10-CM | POA: Diagnosis not present

## 2018-02-19 DIAGNOSIS — I69320 Aphasia following cerebral infarction: Secondary | ICD-10-CM | POA: Diagnosis not present

## 2018-02-20 ENCOUNTER — Emergency Department (HOSPITAL_COMMUNITY): Payer: Medicare Other

## 2018-02-20 ENCOUNTER — Encounter (HOSPITAL_COMMUNITY): Payer: Self-pay

## 2018-02-20 ENCOUNTER — Observation Stay (HOSPITAL_COMMUNITY)
Admission: EM | Admit: 2018-02-20 | Discharge: 2018-02-21 | Disposition: A | Discharge status: Home / Self Care | Payer: Medicare Other | Attending: Internal Medicine | Admitting: Internal Medicine

## 2018-02-20 ENCOUNTER — Other Ambulatory Visit: Payer: Self-pay

## 2018-02-20 DIAGNOSIS — N183 Chronic kidney disease, stage 3 unspecified: Secondary | ICD-10-CM | POA: Diagnosis present

## 2018-02-20 DIAGNOSIS — Z79899 Other long term (current) drug therapy: Secondary | ICD-10-CM | POA: Diagnosis not present

## 2018-02-20 DIAGNOSIS — Z7901 Long term (current) use of anticoagulants: Secondary | ICD-10-CM | POA: Insufficient documentation

## 2018-02-20 DIAGNOSIS — I110 Hypertensive heart disease with heart failure: Secondary | ICD-10-CM | POA: Insufficient documentation

## 2018-02-20 DIAGNOSIS — I69398 Other sequelae of cerebral infarction: Secondary | ICD-10-CM

## 2018-02-20 DIAGNOSIS — R0902 Hypoxemia: Secondary | ICD-10-CM | POA: Diagnosis not present

## 2018-02-20 DIAGNOSIS — R001 Bradycardia, unspecified: Principal | ICD-10-CM | POA: Diagnosis present

## 2018-02-20 DIAGNOSIS — Z9889 Other specified postprocedural states: Secondary | ICD-10-CM

## 2018-02-20 DIAGNOSIS — R51 Headache: Secondary | ICD-10-CM | POA: Insufficient documentation

## 2018-02-20 DIAGNOSIS — I4891 Unspecified atrial fibrillation: Secondary | ICD-10-CM | POA: Diagnosis present

## 2018-02-20 DIAGNOSIS — R7889 Finding of other specified substances, not normally found in blood: Secondary | ICD-10-CM | POA: Diagnosis not present

## 2018-02-20 DIAGNOSIS — R918 Other nonspecific abnormal finding of lung field: Secondary | ICD-10-CM | POA: Diagnosis not present

## 2018-02-20 DIAGNOSIS — R7989 Other specified abnormal findings of blood chemistry: Secondary | ICD-10-CM | POA: Diagnosis not present

## 2018-02-20 DIAGNOSIS — R29810 Facial weakness: Secondary | ICD-10-CM | POA: Diagnosis not present

## 2018-02-20 DIAGNOSIS — R778 Other specified abnormalities of plasma proteins: Secondary | ICD-10-CM | POA: Diagnosis present

## 2018-02-20 DIAGNOSIS — I10 Essential (primary) hypertension: Secondary | ICD-10-CM | POA: Diagnosis present

## 2018-02-20 DIAGNOSIS — R748 Abnormal levels of other serum enzymes: Secondary | ICD-10-CM | POA: Diagnosis not present

## 2018-02-20 DIAGNOSIS — I5032 Chronic diastolic (congestive) heart failure: Secondary | ICD-10-CM | POA: Diagnosis present

## 2018-02-20 DIAGNOSIS — I69351 Hemiplegia and hemiparesis following cerebral infarction affecting right dominant side: Secondary | ICD-10-CM

## 2018-02-20 DIAGNOSIS — M6281 Muscle weakness (generalized): Secondary | ICD-10-CM | POA: Diagnosis present

## 2018-02-20 DIAGNOSIS — I69320 Aphasia following cerebral infarction: Secondary | ICD-10-CM | POA: Diagnosis present

## 2018-02-20 DIAGNOSIS — R269 Unspecified abnormalities of gait and mobility: Secondary | ICD-10-CM

## 2018-02-20 DIAGNOSIS — I6389 Other cerebral infarction: Secondary | ICD-10-CM | POA: Diagnosis not present

## 2018-02-20 DIAGNOSIS — R4020 Unspecified coma: Secondary | ICD-10-CM | POA: Diagnosis not present

## 2018-02-20 LAB — URINALYSIS, ROUTINE W REFLEX MICROSCOPIC
Bilirubin Urine: NEGATIVE
Glucose, UA: NEGATIVE mg/dL
Hgb urine dipstick: NEGATIVE
Ketones, ur: NEGATIVE mg/dL
Leukocytes, UA: NEGATIVE
Nitrite: NEGATIVE
Protein, ur: NEGATIVE mg/dL
Specific Gravity, Urine: 1.016 (ref 1.005–1.030)
pH: 6 (ref 5.0–8.0)

## 2018-02-20 LAB — COMPREHENSIVE METABOLIC PANEL
ALT: 14 U/L (ref 0–44)
AST: 30 U/L (ref 15–41)
Albumin: 3.4 g/dL — ABNORMAL LOW (ref 3.5–5.0)
Alkaline Phosphatase: 71 U/L (ref 38–126)
Anion gap: 10 (ref 5–15)
BUN: 20 mg/dL (ref 8–23)
CO2: 23 mmol/L (ref 22–32)
Calcium: 9.2 mg/dL (ref 8.9–10.3)
Chloride: 106 mmol/L (ref 98–111)
Creatinine, Ser: 1.31 mg/dL — ABNORMAL HIGH (ref 0.44–1.00)
GFR calc Af Amer: 44 mL/min — ABNORMAL LOW (ref >60)
GFR calc non Af Amer: 38 mL/min — ABNORMAL LOW (ref >60)
Glucose, Bld: 91 mg/dL (ref 70–99)
Potassium: 3.9 mmol/L (ref 3.5–5.1)
Sodium: 139 mmol/L (ref 135–145)
Total Bilirubin: 0.8 mg/dL (ref 0.3–1.2)
Total Protein: 7.3 g/dL (ref 6.5–8.1)

## 2018-02-20 LAB — CBC WITH DIFFERENTIAL/PLATELET
Basophils Absolute: 0 10*3/uL (ref 0.0–0.1)
Basophils Relative: 0 %
Eosinophils Absolute: 0.1 10*3/uL (ref 0.0–0.7)
Eosinophils Relative: 2 %
HCT: 38.7 % (ref 36.0–46.0)
Hemoglobin: 12.9 g/dL (ref 12.0–15.0)
Lymphocytes Relative: 27 %
Lymphs Abs: 2.2 10*3/uL (ref 0.7–4.0)
MCH: 31.5 pg (ref 26.0–34.0)
MCHC: 33.3 g/dL (ref 30.0–36.0)
MCV: 94.4 fL (ref 78.0–100.0)
Monocytes Absolute: 0.4 10*3/uL (ref 0.1–1.0)
Monocytes Relative: 5 %
Neutro Abs: 5.4 10*3/uL (ref 1.7–7.7)
Neutrophils Relative %: 66 %
Platelets: 305 10*3/uL (ref 150–400)
RBC: 4.1 MIL/uL (ref 3.87–5.11)
RDW: 13.8 % (ref 11.5–15.5)
WBC: 8.1 10*3/uL (ref 4.0–10.5)

## 2018-02-20 LAB — MAGNESIUM: Magnesium: 1.6 mg/dL — ABNORMAL LOW (ref 1.7–2.4)

## 2018-02-20 LAB — TROPONIN I
Troponin I: 0.1 ng/mL (ref <0.03)
Troponin I: 0.09 ng/mL (ref <0.03)
Troponin I: 0.09 ng/mL (ref <0.03)

## 2018-02-20 LAB — PROTIME-INR
INR: 1.32
Prothrombin Time: 16.3 seconds — ABNORMAL HIGH (ref 11.4–15.2)

## 2018-02-20 MED ORDER — ACETAMINOPHEN 325 MG PO TABS
650.0000 mg | ORAL_TABLET | Freq: Four times a day (QID) | ORAL | Status: DC | PRN
  Administered 2018-02-20: 650 mg via ORAL
  Filled 2018-02-20: qty 2

## 2018-02-20 MED ORDER — ATORVASTATIN CALCIUM 40 MG PO TABS
40.0000 mg | ORAL_TABLET | Freq: Every day | ORAL | Status: DC
  Administered 2018-02-20: 40 mg via ORAL
  Filled 2018-02-20: qty 1

## 2018-02-20 MED ORDER — ONDANSETRON HCL 4 MG/2ML IJ SOLN: 4.0000 mg | Freq: Four times a day (QID) | INTRAMUSCULAR | Status: DC | PRN

## 2018-02-20 MED ORDER — MAGNESIUM SULFATE 2 GM/50ML IV SOLN
2.0000 g | INTRAVENOUS | Status: AC
  Administered 2018-02-20: 2 g via INTRAVENOUS
  Filled 2018-02-20: qty 50

## 2018-02-20 MED ORDER — HYDROCHLOROTHIAZIDE 12.5 MG PO CAPS
12.5000 mg | ORAL_CAPSULE | Freq: Every day | ORAL | Status: DC
  Administered 2018-02-20 – 2018-02-21 (×2): 12.5 mg via ORAL
  Filled 2018-02-20 (×2): qty 1

## 2018-02-20 MED ORDER — ACETAMINOPHEN 325 MG PO TABS: 650.0000 mg | ORAL_TABLET | ORAL | Status: DC | PRN

## 2018-02-20 MED ORDER — APIXABAN 5 MG PO TABS
5.0000 mg | ORAL_TABLET | Freq: Two times a day (BID) | ORAL | Status: DC
  Administered 2018-02-20 – 2018-02-21 (×2): 5 mg via ORAL
  Filled 2018-02-20 (×2): qty 1

## 2018-02-20 MED ORDER — FERROUS SULFATE 325 (65 FE) MG PO TABS
325.0000 mg | ORAL_TABLET | Freq: Every day | ORAL | Status: DC
  Administered 2018-02-21: 325 mg via ORAL
  Filled 2018-02-20: qty 1

## 2018-02-20 MED ORDER — POTASSIUM CHLORIDE CRYS ER 20 MEQ PO TBCR
10.0000 meq | EXTENDED_RELEASE_TABLET | Freq: Every day | ORAL | Status: DC
  Administered 2018-02-20 – 2018-02-21 (×2): 10 meq via ORAL
  Filled 2018-02-20 (×2): qty 1

## 2018-02-20 MED ORDER — BISMUTH SUBSALICYLATE 262 MG PO CHEW
524.0000 mg | CHEWABLE_TABLET | ORAL | Status: DC | PRN
  Filled 2018-02-20: qty 2

## 2018-02-20 NOTE — ED Notes (Signed)
CRITICAL VALUE ALERT  Critical Value:  Troponin  0.10  Date & Time Notied:  02/20/18 1250  Provider Notified: Dr Sabra Heck  Orders Received/Actions taken: None given

## 2018-02-20 NOTE — H&P (Signed)
History and Physical    Brooke Chavez:[id number] DOB: 28-Apr-1941 DOA: 02/20/2018  PCP: [name], FNP  Patient coming from:  home  Chief Complaint: Generalized weakness  HPI: Brooke Chavez is a 77 y.o. female with medical history significant of cardiac surgery minimally invasive valvular repair in May 2019 who postoperatively had a left large MCA stroke this was discharged from rehab last week.  Patient went home with family.  She still has residual right-sided weakness and facial droop.  She can walk with a walker.  She is recently follow-up with cardiology who stopped her amiodarone secondary to episodes of bradycardia.  She is found to be in the emergency department bradycardic in the 40s.  She denies any shortness of breath or chest pain.  She denies any worsening of her focal neurological deficits.  Patient's troponin was mildly elevated and was referred for admission for possible symptomatic bradycardia.  Cardiology  a call Dr. [name] called who recommended observing her overnight and if her troponin did not rise significantly to continue with outpatient follow-up.  Review o the f Systems: As per HPI otherwise 10 point review of systems negative Per daughter  Past Medical History:  Diagnosis Date  . Atrial flutter ([place])    Typical by EKG diagnosis 9/11 s/p CIT ablation 11/11  . Bradycardia   . Chronic diastolic congestive heart failure ([place])   . DJD (degenerative joint disease)   . Dysrhythmia   . Femur fracture, left ([place]) 2013  . Heart murmur   . Hyperlipidemia    x5 years  . Hypertension    Since 1997  . Liver masses 11/13/2017   Multiple small nodules seen on CT and MRI  . Mitral regurgitation    severe  . 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 TR (tricuspid  regurgitation)    Mild with RA enlargment    Past Surgical History:  Procedure Laterality Date  . A FLUTTER ABLATION N/A 06/27/2011   Procedure: ABLATION A FLUTTER;  Surgeon: Thompson Grayer, MD;  Location: [facility] CATH LAB;  Service: Cardiovascular;  Laterality: N/A;  . ATRIAL ABLATION SURGERY  06/2010  . HIP SURGERY     Left (fracture) 3/13  . JOINT REPLACEMENT     both knees   . KNEE ARTHROSCOPY     Right  . LUMBAR SPINE SURGERY    . MITRAL VALVE REPAIR Right 01/07/2018   Procedure: MINIMALLY INVASIVE MITRAL VALVE REPAIR using LivaNova ring size 28 MM;  Surgeon: Rexene Alberts, MD;  Location: [facility];  Service: Open Heart Surgery;  Laterality: Right;  . PATENT FORAMEN OVALE(PFO) CLOSURE N/A 01/07/2018   Procedure: PATENT FORAMEN OVALE (PFO) CLOSURE;  Surgeon: Rexene Alberts, MD;  Location: [facility];  Service: Open Heart Surgery;  Laterality: N/A;  . RIGHT/LEFT HEART CATH AND CORONARY ANGIOGRAPHY N/A 11/11/2017   Procedure: RIGHT/LEFT HEART CATH AND CORONARY ANGIOGRAPHY;  Surgeon: Sherren Mocha, MD;  Location: [facility] CV LAB;  Service: Cardiovascular;  Laterality: N/A;  . TEE WITHOUT CARDIOVERSION N/A 09/18/2017   Procedure: TRANSESOPHAGEAL ECHOCARDIOGRAM (TEE);  Surgeon: Skeet Latch, MD;  Location: [facility];  Service: Cardiovascular;  Laterality: N/A;  . TEE WITHOUT CARDIOVERSION N/A 01/07/2018   Procedure: TRANSESOPHAGEAL ECHOCARDIOGRAM (TEE);  Surgeon: Rexene Alberts, MD;  Location: [facility];  Service: Open Heart Surgery;  Laterality: N/A;  . TOTAL KNEE ARTHROPLASTY     Left  . TUBAL LIGATION       reports that she has never smoked. She has never used smokeless tobacco. She reports that she does not drink alcohol or use drugs.  Allergies  Allergen Reactions  . Oxycodone-Acetaminophen Anxiety and Other (See Comments)    Hallucinations  . Aspirin Nausea And Vomiting    Family History  Problem Relation Age of Onset  . Hypertension Mother   . Cancer Sister        leukemia    . Diabetes Brother   . Stroke Sister   . Diabetes Brother   . Heart attack Brother   . Healthy Daughter   . Healthy Son   . Healthy Daughter     Prior to Admission medications   Medication Sig Start Date End Date Taking? Authorizing Provider  acetaminophen (TYLENOL) 325 MG tablet Take 2 tablets (650 mg total) by mouth every 6 (six) hours as needed for mild pain (or Fever >/= 101). 02/12/18  Yes Angiulli, Lavon Paganini, PA-C  apixaban (ELIQUIS) 5 MG TABS tablet Take 1 tablet (5 mg total) by mouth 2 (two) times daily. 02/12/18  Yes Angiulli, Lavon Paganini, PA-C  atorvastatin (LIPITOR) 40 MG tablet Take 1 tablet (40 mg total) by mouth at bedtime. 02/12/18  Yes Angiulli, Lavon Paganini, PA-C  bismuth subsalicylate (PEPTO BISMOL) 262 MG chewable tablet Chew 524 mg by mouth as needed for diarrhea or loose stools.   Yes [provider]  ferrous sulfate 325 (65 FE) MG tablet Take 1 tablet (325 mg total) by mouth daily with breakfast. For one month then stop. 02/12/18 02/12/19 Yes Angiulli, Lavon Paganini, PA-C  hydrochlorothiazide (MICROZIDE) 12.5 MG capsule Take 1 capsule (12.5 mg total) by mouth daily. 02/12/18  Yes Angiulli, Lavon Paganini, PA-C  potassium chloride (K-DUR,KLOR-CON) 10 MEQ tablet Take 1 tablet (10 mEq total) by mouth daily. 02/12/18  Yes Angiulli, Lavon Paganini, PA-C  famotidine (PEPCID) 20 MG tablet Take 1 tablet (20 mg total) by mouth 2 (two) times daily. 07/29/11 10/29/11  Richardson Dopp T, PA-C    Physical Exam: Vitals:   02/20/18 1500 02/20/18 1600 02/20/18 1630 02/20/18 1721  BP: (!) 161/72 (!) 155/74 137/76 (!) 172/73  Pulse: 78 (!) 33  76  Resp: 19 18 18 16   Temp:    98.6 F (37 C)  TempSrc:    Oral  SpO2: 100% 100%  98%  Weight:    63.5 kg (140 lb 1.6 oz)  Height:    5\' 1"  (1.549 m)      Constitutional: NAD, calm, comfortable Vitals:   02/20/18 1500 02/20/18 1600 02/20/18 1630 02/20/18 1721  BP: (!) 161/72 (!) 155/74 137/76 (!) 172/73  Pulse: 78 (!) 33  76  Resp: 19 18 18 16   Temp:    98.6  F (37 C)  TempSrc:    Oral  SpO2: 100% 100%  98%  Weight:    63.5 kg (140 lb 1.6 oz)  Height:    5\' 1"  (1.549 m)   Eyes: PERRL, lids and conjunctivae normal ENMT: Mucous membranes are moist. Posterior pharynx clear of any exudate or lesions.Normal dentition.  Neck: normal, supple, no masses, no thyromegaly Respiratory: clear to auscultation bilaterally, no wheezing, no crackles. Normal respiratory effort. No accessory muscle use.  Cardiovascular: Regular rate and rhythm, no murmurs / rubs / gallops. No extremity edema. 2+ pedal pulses. No carotid bruits.  Abdomen: no tenderness, no masses palpated.  No hepatosplenomegaly. Bowel sounds positive.  Musculoskeletal: no clubbing / cyanosis. No joint deformity upper and lower extremities. Good ROM, no contractures. Normal muscle tone.  Skin: no rashes, lesions, ulcers. No induration Neurologic: Right-sided hemiparesis  psychiatric: Normal judgment and insight. Alert and oriented x 1 which is her baseline. Normal mood.    Labs on Admission: I have personally reviewed following labs and imaging studies  CBC: Recent Labs  Lab 02/20/18 1211  WBC 8.1  NEUTROABS 5.4  HGB 12.9  HCT 38.7  MCV 94.4  PLT 202   Basic Metabolic Panel: Recent Labs  Lab 02/20/18 1211  NA 139  K 3.9  CL 106  CO2 23  GLUCOSE 91  BUN 20  CREATININE 1.31*  CALCIUM 9.2  MG 1.6*   GFR: Estimated Creatinine Clearance: 30.7 mL/min (A) (by C-G formula based on SCr of 1.31 mg/dL (H)). Liver Function Tests: Recent Labs  Lab 02/20/18 1211  AST 30  ALT 14  ALKPHOS 71  BILITOT 0.8  PROT 7.3  ALBUMIN 3.4*   No results for input(s): LIPASE, AMYLASE in the last 168 hours. No results for input(s): AMMONIA in the last 168 hours. Coagulation Profile: Recent Labs  Lab 02/20/18 1303  INR 1.32   Cardiac Enzymes: Recent Labs  Lab 02/20/18 1211 02/20/18 1533  TROPONINI 0.10* 0.09*   BNP (last 3 results) No results for input(s): PROBNP in the last 8760  hours. HbA1C: No results for input(s): HGBA1C in the last 72 hours. CBG: No results for input(s): GLUCAP in the last 168 hours. Lipid Profile: No results for input(s): CHOL, HDL, LDLCALC, TRIG, CHOLHDL, LDLDIRECT in the last 72 hours. Thyroid Function Tests: No results for input(s): TSH, T4TOTAL, FREET4, T3FREE, THYROIDAB in the last 72 hours. Anemia Panel: No results for input(s): VITAMINB12, FOLATE, FERRITIN, TIBC, IRON, RETICCTPCT in the last 72 hours. Urine analysis:    Component Value Date/Time   COLORURINE YELLOW 02/20/2018 [place] 02/20/2018 1454   LABSPEC 1.016 02/20/2018 1454   PHURINE 6.0 02/20/2018 1454   GLUCOSEU NEGATIVE 02/20/2018 1454   HGBUR NEGATIVE 02/20/2018 1454   BILIRUBINUR NEGATIVE 02/20/2018 1454   BILIRUBINUR negative 09/19/2014 0938   KETONESUR NEGATIVE 02/20/2018 1454   PROTEINUR NEGATIVE 02/20/2018 1454   UROBILINOGEN negative 09/19/2014 0938   UROBILINOGEN 1.0 05/09/2010 1224   NITRITE NEGATIVE 02/20/2018 1454   LEUKOCYTESUR NEGATIVE 02/20/2018 1454   Sepsis Labs: !!!!!!!!!!!!!!!!!!!!!!!!!!!!!!!!!!!!!!!!!!!! @LABRCNTIP (procalcitonin:4,lacticidven:4) )No results found for this or any previous visit (from the past 240 hour(s)).   Radiological Exams on Admission: Ct Head Wo Contrast  Result Date: 02/20/2018 CLINICAL DATA:  Headache and dizziness. EXAM: CT HEAD WITHOUT CONTRAST TECHNIQUE: Contiguous axial images were obtained from the base of the skull through the vertex without intravenous contrast. COMPARISON:  CT head dated February 07, 2018. FINDINGS: Brain: Continued evolution of the large left MCA territory infarct with laminar necrosis. No new acute infarct, hemorrhage, hydrocephalus, extra-axial collection or mass lesion/mass effect. Chronic left cerebellar infarct again noted. Stable mild chronic microvascular ischemic changes. Vascular: Calcified atherosclerosis at the skullbase. No hyperdense vessel. Skull: Negative for fracture or  focal lesion. Sinuses/Orbits: No acute finding. Other: None. IMPRESSION: 1. Continued evolution of the large left MCA territory infarct. No new acute intracranial abnormality. Electronically Signed   By: Titus Dubin M.D.   On: 02/20/2018 13:03   Dg Chest Port 1 View  Result Date: 02/20/2018 CLINICAL DATA:  77 year old female with history of nausea. Difficulty walking. EXAM: PORTABLE CHEST 1 VIEW COMPARISON:  Chest x-ray 01/27/2018. FINDINGS: Lung volumes are normal. No consolidative airspace disease. No pleural effusions. Linear scarring in the periphery of the right mid lung is unchanged. No evidence of pulmonary edema. Mild to moderate cardiomegaly. Dilatation of the central pulmonary arteries, concerning for pulmonary arterial hypertension. Upper mediastinal contours are within normal limits. Aortic atherosclerosis. Status post mitral valve replacement. IMPRESSION: 1. No radiographic evidence of acute cardiopulmonary disease. 2. Mild-to-moderate cardiomegaly. 3. Aortic atherosclerosis. 4. Dilatation of the central pulmonary arteries, concerning for pulmonary arterial hypertension. Electronically Signed   By: Vinnie Langton M.D.   On: 02/20/2018 13:46    EKG: Independently reviewed.  Normal sinus rhythm with multiple PVCs  Old chart reviewed  Case discussed with EDP Dr. Sabra Heck  Assessment/Plan 77 year old female with recent stroke comes in with generalized weakness found to have bradycardia and elevated troponin Principal Problem:   Bradycardia amiodarone just stopped this past week by cardiology.-Likely still some in her system.  Monitor overnight on telemetry.  Serial troponin.  Active Problems:   Elevated troponin-not trending upward for now.  Continue to serial overnight.  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

## 2018-02-20 NOTE — ED Provider Notes (Signed)
[facility] EMERGENCY DEPARTMENT Provider Note   CSN: [id number] Arrival date & time: 02/20/18  1143     History   Chief Complaint Chief Complaint  Patient presents with  . Hypertension    HPI Brooke Chavez is a 77 y.o. female.  HPI  The patient is a 77 year old female, she presents today with a complaint of generalized weakness, the primary historians are her 2 daughters who are at the bedside, the patient has had a recent stroke which caused both receptive and expressive aphasia as well as right-sided weakness, this was a left middle cerebral artery ischemic stroke. The patient had previously been on Coumadin after having mitral valve valvuloplasty, 2 weeks later she suffered these symptoms of the right-sided weakness and aphasia. She was admitted to the hospital and stayed 2 or 3 weeks in the hospital including rehabilitation and has been home for approximately one week. There was no complaints of fevers vomiting diarrhea but has had decreased appetite over the last week. She has been working with physical and occupational therapy. The daughters deny any other symptoms but when he went to see her this morning and tried to sit her up to help her get out of bed she did not look right to them, she appeared weak and endorsed being dizzy, there was no other specific complaints because of the aphasia it was difficult to understand. When I asked the patient if she has a headache she nods yes when I ask if she has chest pain she nauseous, when I ask if she has shortness of breath she nods yes. She denies fevers vomiting or diarrhea, she denies nausea.  Initially the family was concerned about the patient's blood pressure at was over 287 systolic however by the time of arrival it is dropped down to around 681 systolic.  The cardiology note from her most recent visit on of 02/17/2018 clarified that the patient had a maze procedure, closure of a patent foramen ovale and the mitral valve minimally  invasive repair. It also stated that she had had some difficulty with junctional rhythms when she was taking amiodarone and thus it was stopped. She was on 200 mg a day. It is possible that she has been restarted on that medication based on the last note however it is unclear and family cannot tell me.  Past Medical History:  Diagnosis Date  . Atrial flutter ([place])    Typical by EKG diagnosis 9/11 s/p CIT ablation 11/11  . Bradycardia   . Chronic diastolic congestive heart failure ([place])   . DJD (degenerative joint disease)   . Dysrhythmia   . Femur fracture, left ([place]) 2013  . Heart murmur   . Hyperlipidemia    x5 years  . Hypertension    Since 1997  . Liver masses 11/13/2017   Multiple small nodules seen on CT and MRI  . Mitral regurgitation    severe  . 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 TR (tricuspid regurgitation)    Mild with RA enlargment    Patient Active Problem List   Diagnosis Date Noted  . Atrial fibrillation ([place])   . Hypokalemia   . Acute lower UTI   . H/O mitral valve repair   . Benign essential HTN   . History of atrial fibrillation   . Stage 3  chronic kidney disease ([place])   . Hypoalbuminemia due to protein-calorie malnutrition ([place])   . Global aphasia   . Acute blood loss anemia   . Left middle cerebral artery stroke ([place]) 01/27/2018  . 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 Gait disturbance, post-stroke   . Dysphagia, post-stroke   . Advance care planning   . Goals of care, counseling/discussion   . Palliative care by specialist   . Acute ischemic left MCA stroke ([place]) 01/24/2018  . Stroke (cerebrum) ([place]) 01/24/2018  . Encounter for  therapeutic drug monitoring 01/18/2018  . 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 Chronic diastolic congestive heart failure ([place])   . 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 Liver masses 11/13/2017  . BMI 30.0-30.9,adult 07/24/2015  . Osteoporosis 09/19/2014  . GERD (gastroesophageal reflux disease) 07/29/2011  . PAF (paroxysmal atrial fibrillation) ([place]) 05/29/2010  . Hyperlipidemia with target LDL less than 100 01/04/2009  . Essential hypertension 01/04/2009    Past Surgical History:  Procedure Laterality Date  . A FLUTTER ABLATION N/A 06/27/2011   Procedure: ABLATION A FLUTTER;  Surgeon: Thompson Grayer, MD;  Location: [facility] CATH LAB;  Service: Cardiovascular;  Laterality: N/A;  . ATRIAL ABLATION SURGERY  06/2010  . HIP SURGERY     Left (fracture) 3/13  . JOINT REPLACEMENT     both knees   . KNEE ARTHROSCOPY     Right  . LUMBAR SPINE SURGERY    . MITRAL VALVE REPAIR Right 01/07/2018   Procedure: MINIMALLY INVASIVE MITRAL VALVE REPAIR using LivaNova ring size 28 MM;  Surgeon: Rexene Alberts, MD;  Location: [facility];  Service: Open Heart Surgery;  Laterality: Right;  . PATENT FORAMEN OVALE(PFO) CLOSURE N/A 01/07/2018   Procedure: PATENT FORAMEN OVALE (PFO) CLOSURE;  Surgeon: Rexene Alberts, MD;  Location: [facility];  Service: Open Heart Surgery;  Laterality: N/A;  . RIGHT/LEFT HEART CATH AND CORONARY ANGIOGRAPHY N/A 11/11/2017   Procedure: RIGHT/LEFT HEART CATH AND CORONARY ANGIOGRAPHY;  Surgeon: Sherren Mocha, MD;  Location: [facility] CV LAB;  Service: Cardiovascular;  Laterality: N/A;  . TEE WITHOUT CARDIOVERSION N/A 09/18/2017   Procedure: TRANSESOPHAGEAL ECHOCARDIOGRAM (TEE);  Surgeon: Skeet Latch, MD;  Location: [facility];  Service: Cardiovascular;  Laterality: N/A;  . TEE WITHOUT CARDIOVERSION N/A 01/07/2018   Procedure: TRANSESOPHAGEAL ECHOCARDIOGRAM (TEE);  Surgeon: Rexene Alberts, MD;  Location: [facility];  Service: Open Heart Surgery;  Laterality: N/A;  . TOTAL KNEE  ARTHROPLASTY     Left  . TUBAL LIGATION       OB History   None      Home Medications    Prior to Admission medications   Medication Sig Start Date End Date Taking? Authorizing Provider  acetaminophen (TYLENOL) 325 MG tablet Take 2 tablets (650 mg total) by mouth every 6 (six) hours as needed for mild pain (or Fever >/= 101). 02/12/18  Yes Angiulli, Lavon Paganini, PA-C  apixaban (ELIQUIS) 5 MG TABS tablet Take 1 tablet (5 mg total) by mouth 2 (two) times daily. 02/12/18  Yes Angiulli, Lavon Paganini, PA-C  atorvastatin (LIPITOR) 40 MG tablet Take 1 tablet (40 mg total) by mouth at bedtime. 02/12/18  Yes Angiulli, Lavon Paganini, PA-C  bismuth subsalicylate (PEPTO BISMOL) 262 MG chewable tablet Chew 524 mg by mouth as needed for diarrhea or loose stools.   Yes [provider]  ferrous  sulfate 325 (65 FE) MG tablet Take 1 tablet (325 mg total) by mouth daily with breakfast. For one month then stop. 02/12/18 02/12/19 Yes Angiulli, Lavon Paganini, PA-C  hydrochlorothiazide (MICROZIDE) 12.5 MG capsule Take 1 capsule (12.5 mg total) by mouth daily. 02/12/18  Yes Angiulli, Lavon Paganini, PA-C  potassium chloride (K-DUR,KLOR-CON) 10 MEQ tablet Take 1 tablet (10 mEq total) by mouth daily. 02/12/18  Yes Angiulli, Lavon Paganini, PA-C  famotidine (PEPCID) 20 MG tablet Take 1 tablet (20 mg total) by mouth 2 (two) times daily. 07/29/11 10/29/11  Liliane Shi, PA-C    Family History Family History  Problem Relation Age of Onset  . Hypertension Mother   . Cancer Sister        leukemia  . Diabetes Brother   . Stroke Sister   . Diabetes Brother   . Heart attack Brother   . Healthy Daughter   . Healthy Son   . Healthy Daughter     Social History Social History   Tobacco Use  . Smoking status: Never Smoker  . Smokeless tobacco: Never Used  Substance Use Topics  . Alcohol use: No  . Drug use: No     Allergies   Oxycodone-acetaminophen and Aspirin   Review of Systems Review of Systems  All other systems reviewed and  are negative.    Physical Exam Updated Vital Signs BP 137/64   Pulse (!) 35   Temp 98.2 F (36.8 C) (Oral)   Resp 20   Wt 66.7 kg (147 lb)   LMP 11/05/1992   SpO2 100%   BMI 26.04 kg/m   Physical Exam  Constitutional: She appears well-developed and well-nourished. No distress.  HENT:  Head: Normocephalic and atraumatic.  Mouth/Throat: Oropharynx is clear and moist. No oropharyngeal exudate.  Eyes: Pupils are equal, round, and reactive to light. Conjunctivae and EOM are normal. Right eye exhibits no discharge. Left eye exhibits no discharge. No scleral icterus.  Neck: Normal range of motion. Neck supple. No JVD present. No thyromegaly present.  Cardiovascular: Normal rate, regular rhythm and intact distal pulses. Exam reveals no gallop and no friction rub.  Murmur ( systolic) heard. Pulmonary/Chest: Effort normal and breath sounds normal. No respiratory distress. She has no wheezes. She has no rales.  Abdominal: Soft. Bowel sounds are normal. She exhibits no distension and no mass. There is no tenderness.  Musculoskeletal: Normal range of motion. She exhibits no edema or tenderness.  Lymphadenopathy:    She has no cervical adenopathy.  Neurological: She is alert. Coordination normal.  Slight right sided weakness of arm and leg, right sided facial droop - aphasia present.  Skin: Skin is warm and dry. No rash noted. No erythema.  Psychiatric: She has a normal mood and affect. Her behavior is normal.  Nursing note and vitals reviewed.    ED Treatments / Results  Labs (all labs ordered are listed, but only abnormal results are displayed) Labs Reviewed  COMPREHENSIVE METABOLIC PANEL - Abnormal; Notable for the following components:      Result Value   Creatinine, Ser 1.31 (*)    Albumin 3.4 (*)    GFR calc non Af Amer 38 (*)    GFR calc Af Amer 44 (*)    All other components within normal limits  TROPONIN I - Abnormal; Notable for the following components:   Troponin I  0.10 (*)    All other components within normal limits  MAGNESIUM - Abnormal; Notable for the following components:  Magnesium 1.6 (*)    All other components within normal limits  PROTIME-INR - Abnormal; Notable for the following components:   Prothrombin Time 16.3 (*)    All other components within normal limits  URINE CULTURE  CBC WITH DIFFERENTIAL/PLATELET  URINALYSIS, ROUTINE W REFLEX MICROSCOPIC  TROPONIN I    EKG EKG Interpretation  Date/Time:  Saturday February 20 2018 11:47:48 EDT Ventricular Rate:  75 PR Interval:    QRS Duration: 129 QT Interval:  461 QTC Calculation: 522 R Axis:   -67 Text Interpretation:  Sinus rhythm Multiple premature complexes, vent & supraven Sinus pause Short PR interval Left bundle branch block AV dissociation no longer seen Confirmed by Noemi Chapel [id number]) on 02/20/2018 11:57:17 AM   Radiology Ct Head Wo Contrast  Result Date: 02/20/2018 CLINICAL DATA:  Headache and dizziness. EXAM: CT HEAD WITHOUT CONTRAST TECHNIQUE: Contiguous axial images were obtained from the base of the skull through the vertex without intravenous contrast. COMPARISON:  CT head dated February 07, 2018. FINDINGS: Brain: Continued evolution of the large left MCA territory infarct with laminar necrosis. No new acute infarct, hemorrhage, hydrocephalus, extra-axial collection or mass lesion/mass effect. Chronic left cerebellar infarct again noted. Stable mild chronic microvascular ischemic changes. Vascular: Calcified atherosclerosis at the skullbase. No hyperdense vessel. Skull: Negative for fracture or focal lesion. Sinuses/Orbits: No acute finding. Other: None. IMPRESSION: 1. Continued evolution of the large left MCA territory infarct. No new acute intracranial abnormality. Electronically Signed   By: Titus Dubin M.D.   On: 02/20/2018 13:03   Dg Chest Port 1 View  Result Date: 02/20/2018 CLINICAL DATA:  77 year old female with history of nausea. Difficulty walking. EXAM:  PORTABLE CHEST 1 VIEW COMPARISON:  Chest x-ray 01/27/2018. FINDINGS: Lung volumes are normal. No consolidative airspace disease. No pleural effusions. Linear scarring in the periphery of the right mid lung is unchanged. No evidence of pulmonary edema. Mild to moderate cardiomegaly. Dilatation of the central pulmonary arteries, concerning for pulmonary arterial hypertension. Upper mediastinal contours are within normal limits. Aortic atherosclerosis. Status post mitral valve replacement. IMPRESSION: 1. No radiographic evidence of acute cardiopulmonary disease. 2. Mild-to-moderate cardiomegaly. 3. Aortic atherosclerosis. 4. Dilatation of the central pulmonary arteries, concerning for pulmonary arterial hypertension. Electronically Signed   By: Vinnie Langton M.D.   On: 02/20/2018 13:46    Procedures .Critical Care Performed by: Noemi Chapel, MD Authorized by: Noemi Chapel, MD   Critical care provider statement:    Critical care time (minutes):  35   Critical care time was exclusive of:  Separately billable procedures and treating other patients and teaching time   Critical care was necessary to treat or prevent imminent or life-threatening deterioration of the following conditions:  Cardiac failure   Critical care was time spent personally by me on the following activities:  Blood draw for specimens, development of treatment plan with patient or surrogate, discussions with consultants, evaluation of patient's response to treatment, examination of patient, obtaining history from patient or surrogate, ordering and performing treatments and interventions, ordering and review of laboratory studies, ordering and review of radiographic studies, pulse oximetry, re-evaluation of patient's condition and review of old charts   (including critical care time)  Medications Ordered in ED Medications  magnesium sulfate IVPB 2 g 50 mL (has no administration in time range)     Initial Impression / Assessment  and Plan / ED Course  I have reviewed the triage vital signs and the nursing notes.  Pertinent labs & imaging results that were  available during my care of the patient were reviewed by me and considered in my medical decision making (see chart for details).    The patient is known to have severe mitral valve regurgitation which explains her murmur, the murmur is soft, she is anticoagulated on Eliquis, because of her recent stroke I would be concerned about some hemorrhagic transformation or new stroke. It is not clear exactly what is causing her symptoms today as the patient is unable to tell me. When I am concerned about however is that intermittently she is going into a junctional rhythm on the monitor, this rate drops down to about 30, it will stay there for several minutes and then go back up to 80 and what appears to be more of a sinus rhythm. She's never had a pacemaker. She has had a prior ablation for atrial fibrillation in the past.  The patient's blood work is slightly abnormal with an elevated troponin at 0.1, her creatinine is 1.3, CT scan shows an evolving infarct, cardiac monitoring reveals intermittent episodes of junctional bradycardia last for several minutes.  I discussed the patient's care with Dr. [name] of the cardiology service who recommends that the patient be observed in the hospital with troponins trended, cardiac monitoring and likely cardiology evaluation if she continues to have these episodes.  I discussed the care with Dr. [name] who will admit the patient hospital.  Final Clinical Impressions(s) / ED Diagnoses   Final diagnoses:  Junctional bradycardia  Essential hypertension  Elevated troponin  Hypomagnesemia      Noemi Chapel, MD 02/20/18 1512

## 2018-02-20 NOTE — ED Triage Notes (Addendum)
EMS called due to complaints of nausea. Family reports that they got her up to ambulate with walker to go to [facility], but pt complained of nausea and had difficulty walking. When BP was checked initially BP 208/128, then recheck 170/80. Pt has hx of stroke with right sided residual and right facial droop. Reports HA. Complaining of dizziness per daughters

## 2018-02-20 NOTE — ED Notes (Signed)
Pt attempted to use bedpan, unable to provide sample.

## 2018-02-20 NOTE — ED Notes (Signed)
Pt transported to CT and CXR

## 2018-02-21 DIAGNOSIS — N183 Chronic kidney disease, stage 3 (moderate): Secondary | ICD-10-CM

## 2018-02-21 DIAGNOSIS — I639 Cerebral infarction, unspecified: Secondary | ICD-10-CM

## 2018-02-21 DIAGNOSIS — R4701 Aphasia: Secondary | ICD-10-CM

## 2018-02-21 DIAGNOSIS — R001 Bradycardia, unspecified: Secondary | ICD-10-CM

## 2018-02-21 DIAGNOSIS — I482 Chronic atrial fibrillation: Secondary | ICD-10-CM

## 2018-02-21 DIAGNOSIS — R748 Abnormal levels of other serum enzymes: Secondary | ICD-10-CM | POA: Diagnosis not present

## 2018-02-21 DIAGNOSIS — I10 Essential (primary) hypertension: Secondary | ICD-10-CM

## 2018-02-21 DIAGNOSIS — I5032 Chronic diastolic (congestive) heart failure: Secondary | ICD-10-CM

## 2018-02-21 LAB — MAGNESIUM: Magnesium: 2 mg/dL (ref 1.7–2.4)

## 2018-02-21 LAB — TROPONIN I: Troponin I: 0.1 ng/mL (ref <0.03)

## 2018-02-21 MED ORDER — MECLIZINE HCL 12.5 MG PO TABS: 12.5000 mg | ORAL_TABLET | Freq: Three times a day (TID) | ORAL | 0 refills | Status: DC | PRN

## 2018-02-21 MED ORDER — MAGNESIUM OXIDE 400 MG PO TABS: 400.0000 mg | ORAL_TABLET | Freq: Every day | ORAL | 1 refills | Status: DC

## 2018-02-21 NOTE — Discharge Summary (Signed)
Physician Discharge Summary  Brooke Chavez VEL:[id number] DOB: 1940-09-23 DOA: 02/20/2018  PCP: [name], FNP  Admit date: 02/20/2018 Discharge date: 02/21/2018  Time spent: 35 minutes  Recommendations for Outpatient Follow-up:  1. Repeat basic metabolic panel and Mg level to follow electrolytes and renal function 2. Reassess blood pressure and further adjust antihypertensive regimen as needed 3. Outpatient follow-up with cardiology service as instructed.   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  Patient discharged home with instruction to follow-up with PCP as previously scheduled and to follow-up with cardiology service as instructed.  Diet recommendation: Heart healthy diet.  Filed Weights   02/20/18 1148 02/20/18 1721  Weight: 66.7 kg (147 lb) 63.5 kg (140 lb 1.6 oz)    History of present illness:  As per H&P written by Dr. Shanon Chavez on 02/20/18 77 y.o. female with medical history significant of cardiac surgery minimally invasive valvular repair in May 2019 who postoperatively had a left large MCA stroke this was discharged from rehab last week.  Patient went home with family.  She still has residual right-sided weakness and facial droop.  She can walk with a walker.  She is recently follow-up with cardiology who stopped her amiodarone secondary to episodes of bradycardia.  She is found to be in the emergency department  bradycardic in the 40s.  She denies any shortness of breath or chest pain.  She denies any worsening of her focal neurological deficits.  Patient's troponin was mildly elevated and was referred for admission for possible symptomatic bradycardia.  Cardiology  a call Dr. [name] called who recommended observing her overnight and if her troponin did not rise significantly to continue with outpatient follow-up.  Hospital Course:  1-increased weakness and bradycardia: -Patient with recently discontinuation of amiodarone. -After rebleeding electrolytes and providing supportive care patient heart rate stabilizes and remained in sinus rhythm and rate control. -Follow recommendations by cardiology no further interventions will be done during this hospitalization and they will follow her as an outpatient. -Continue potassium and magnesium supplementation.  2-elevated troponin: Remains flat elevation most likely suggesting demand ischemia in the presence of a normal heart rate. -No further ischemic work-up recommended at this time. -Continue Eliquis and statins  3-hypokalemia/hypomagnesemia: Electrolytes were repleted appropriately. -Patient discharged home with oral supplementation -Advised to keep yourself well-hydrated.  4-hemiparesis of right dominant side as a late effect of cerebral infarction: With combined receptive and expressive aphasia. -Continue risk factor modification and home health services as previously arranged. -Continue statins, HCTZ for blood pressure control and Eliquis for secondary prevention.  5-paroxysmal atrial fibrillation -CHADsVASC score 6 -continue eliquis -rate control and sinus now -outpatient follow up with cardiology arranged.   6-dizziness: post stroke -will use PRN meclizine  7-essential HTN -well control with the use of HCTZ -advise to follow low sodium diet and to keep herself well hydrated.   8-CKD stage 3 -remained stable and at baseline  -repeat  BMET at follow up visit  to reassess trend    Procedures:  See below for x-ray reports   Consultations:  Cardiology (curbside on admission and recommended outpatient follow up if troponin stable and no active symptoms).  Discharge Exam: Vitals:   02/21/18 0522 02/21/18 0948  BP: 136/73 (!) 141/73  Pulse: 72 75  Resp: 18 18  Temp: 98.2 F (36.8 C)   SpO2: 100% 100%    General: afebrile, no CP, no SOB, no nausea, no vomiting. Patient reported some mild dizziness sensation and headache, symptoms resolved with tylenol. Looking to go home. Cardiovascular:S1 and S2, soft SEM, no rubs, no gallops, no JVD, sinus rhythm currently. Respiratory: CTA bilaterally Abd: soft, NT, ND, posiitve BS Extremities: no edema, no cyanosis  Neurology: patient with right hemiplegia, no new focal deficits appreciated. Chronic expressive and receptive aphasia.    Discharge Instructions   Discharge Instructions    Diet - low sodium heart healthy   Complete by:  As directed    Discharge instructions   Complete by:  As directed    Take medications as prescribed Keep yourself well hydrated Follow up with PCP as instructed Follow up with cardiology service as an outpatient (call office for appointment details).     Allergies as of 02/21/2018      Reactions   Oxycodone-acetaminophen Anxiety, Other (See Comments)   Hallucinations   Aspirin Nausea And Vomiting      Medication List    TAKE these medications   acetaminophen 325 MG tablet Commonly known as:  TYLENOL Take 2 tablets (650 mg total) by mouth every 6 (six) hours as needed for mild pain (or Fever >/= 101).   apixaban 5 MG Tabs tablet Commonly known as:  ELIQUIS Take 1 tablet (5 mg total) by mouth 2 (two) times daily.   atorvastatin 40 MG tablet Commonly known as:  LIPITOR Take 1 tablet (40 mg total) by mouth at bedtime.   bismuth subsalicylate 709 MG chewable tablet Commonly known as:  PEPTO BISMOL Chew 524 mg by mouth as needed  for diarrhea or loose stools.   ferrous sulfate 325 (65 FE) MG tablet Take 1 tablet (325 mg total) by mouth daily with breakfast. For one month then stop.   hydrochlorothiazide 12.5 MG capsule Commonly known as:  MICROZIDE Take 1 capsule (12.5 mg total) by mouth daily.   magnesium oxide 400 MG tablet Commonly known as:  MAG-OX Take 1 tablet (400 mg total) by mouth daily.   meclizine 12.5 MG tablet Commonly known as:  ANTIVERT Take 1 tablet (12.5 mg total) by mouth 3 (three) times daily as needed for dizziness.   potassium chloride 10 MEQ tablet Commonly known as:  K-DUR,KLOR-CON Take 1 tablet (10 mEq total) by mouth daily.      Allergies  Allergen Reactions  . Oxycodone-Acetaminophen Anxiety and Other (See Comments)    Hallucinations  . Aspirin Nausea And Vomiting   Follow-up Information    [name], FNP. Schedule an appointment as soon as possible for a visit in 10 day(s).   Specialty:  Family Medicine Contact information: [place] [address] [phone]        [name], MD .   Specialty:  Cardiology Contact information: [address] [place] [address] [phone]           The results of significant diagnostics from this hospitalization (including imaging, microbiology, ancillary and laboratory) are listed below for reference.    Significant Diagnostic Studies: Ct Angio Head W  Or Wo Contrast  Result Date: 01/24/2018 CLINICAL DATA:  77 y/o  F; acute stroke for follow-up. EXAM: CT ANGIOGRAPHY HEAD AND NECK CT PERFUSION BRAIN TECHNIQUE: Multidetector CT imaging of the head and neck was performed using the standard protocol during bolus administration of intravenous contrast. Multiplanar CT image reconstructions and MIPs were obtained to evaluate the vascular anatomy. Carotid stenosis measurements (when applicable) are obtained utilizing NASCET criteria, using the distal internal carotid diameter as the  denominator. Multiphase CT imaging of the brain was performed following IV bolus contrast injection. Subsequent parametric perfusion maps were calculated using RAPID software. CONTRAST:  11mL ISOVUE-370 IOPAMIDOL (ISOVUE-370) INJECTION 76% COMPARISON:  01/24/2018 CT head. FINDINGS: CTA NECK FINDINGS Aortic arch: Bovine variant branching. Imaged portion shows no evidence of aneurysm or dissection. No significant stenosis of the major arch vessel origins. Mild calcific atherosclerosis. Right carotid system: No evidence of dissection, stenosis (50% or greater) or occlusion. Left carotid system: No evidence of dissection, stenosis (50% or greater) or occlusion. Vertebral arteries: Codominant. No evidence of dissection, stenosis (50% or greater) or occlusion. Skeleton: Moderate cervical spondylosis with multilevel disc and facet degenerative changes greatest at the C4-C7 levels. No high-grade bony canal stenosis. Other neck: Negative. Upper chest: Small right pleural effusion. Review of the MIP images confirms the above findings CTA HEAD FINDINGS Anterior circulation: Proximal left M1 occlusion with thrombus extending into the terminus with intermediate left MCA distribution collateralization. The thrombus appears approximately 8 mm in length (series 9, image 89). Mild calcific atherosclerosis of carotid siphons. Patent right MCA and bilateral ACA distributions with no additional large vessel occlusion, aneurysm, or significant stenosis. Posterior circulation: No significant stenosis, proximal occlusion, aneurysm, or vascular malformation. Venous sinuses: As permitted by contrast timing, patent. Anatomic variants: Right fetal PCA.  Complete circle-of-Willis. Review of the MIP images confirms the above findings CT Brain Perfusion Findings: CBF (<30%) Volume: 16mL Perfusion (Tmax>6.0s) volume: 145mL Mismatch Volume: 2mL Infarction Location:Left MCA distribution. IMPRESSION: 1. Left proximal M1 occlusion with thrombus  measuring approximately 8 mm in length. Intermediate collaterals. 2. Left MCA distribution infarction with perfusion CT calculated core of 37 cc corresponding to the infarct on noncontrast CT. The calculated core may slightly underestimate the actual core as the region of infarct visible on noncontrast CT (ASPECTS 4) may have areas of pseudonormalization. 3. Large perfusion penumbra, 78 cc. 4. Patent carotid and vertebral arteries in the neck. No significant stenosis by NASCET criteria, dissection, or aneurysm. 5. Patent right MCA, bilateral ACA, and bilateral PCA distributions. These results were called by telephone at the time of interpretation on 01/24/2018 at 1:12 pm to Dr. Meryl Crutch , who verbally acknowledged these results. Electronically Signed   By: Kristine Garbe M.D.   On: 01/24/2018 13:25   Ct Head Wo Contrast  Result Date: 02/20/2018 CLINICAL DATA:  Headache and dizziness. EXAM: CT HEAD WITHOUT CONTRAST TECHNIQUE: Contiguous axial images were obtained from the base of the skull through the vertex without intravenous contrast. COMPARISON:  CT head dated February 07, 2018. FINDINGS: Brain: Continued evolution of the large left MCA territory infarct with laminar necrosis. No new acute infarct, hemorrhage, hydrocephalus, extra-axial collection or mass lesion/mass effect. Chronic left cerebellar infarct again noted. Stable mild chronic microvascular ischemic changes. Vascular: Calcified atherosclerosis at the skullbase. No hyperdense vessel. Skull: Negative for fracture or focal lesion. Sinuses/Orbits: No acute finding. Other: None. IMPRESSION: 1. Continued evolution of the large left MCA territory infarct. No new acute intracranial abnormality. Electronically Signed   By: Gwyndolyn Saxon  Marzella Schlein M.D.   On: 02/20/2018 13:03   Ct Head Wo Contrast  Result Date: 02/07/2018 CLINICAL DATA:  77 y/o  F; stroke for follow-up. EXAM: CT HEAD WITHOUT CONTRAST TECHNIQUE: Contiguous axial images were obtained from  the base of the skull through the vertex without intravenous contrast. COMPARISON:  01/31/2018 CT head and 01/25/2018 MRI head. FINDINGS: Brain: Stable distribution of left MCA infarct with increased perisylvian cortical density, likely developing laminar necrosis. Interval partial dispersion of left basal ganglia hematoma. Decreased edema and mass effect with improved patency of the left lateral ventricle and resolution of midline shift. No new acute stroke, hemorrhage, or focal mass effect identified. Stable background of chronic microvascular ischemic changes and parenchymal volume loss of the brain. Stable chronic infarctions within the left superior cerebellum, right caudate body, and right caudate head. Vascular: Calcific atherosclerosis of carotid siphons. Skull: Normal. Negative for fracture or focal lesion. Sinuses/Orbits: No acute finding. Other: None. IMPRESSION: 1. Stable distribution of left MCA infarct given differences in technique. Increased density of left perisylvian cortex, likely developing laminar necrosis. 2. Partial dispersion of left basal ganglia hematoma, decreased edema, and decreased mass effect. 3. No new acute intracranial abnormality identified. Electronically Signed   By: Kristine Garbe M.D.   On: 02/07/2018 14:13   Ct Head Wo Contrast  Result Date: 01/31/2018 CLINICAL DATA:  Follow-up examination for intracranial hemorrhage. EXAM: CT HEAD WITHOUT CONTRAST TECHNIQUE: Contiguous axial images were obtained from the base of the skull through the vertex without intravenous contrast. COMPARISON:  Prior CT from 01/24/2018 and MRI from 01/25/2018 FINDINGS: Brain: Evolving subacute left MCA territory infarct again seen, relatively stable in size and distribution. Hemorrhagic transformation with parenchymal hemorrhage at the level of the left insula measures 2.9 x 1.0 x 3.3 cm. This is likely similar as compared to previous MRI. Decreased regional mass effect as compared to  prior. No other obvious new or interval hemorrhage. Stable atrophy with chronic microvascular ischemic disease. Remote left cerebellar infarct again noted. No other new or acute large vessel territory infarct. Persistent mild mass effect on the left lateral ventricle without significant midline shift. No hydrocephalus or ventricular trapping. Basilar cisterns remain patent. No extra-axial fluid collection. Vascular: Calcified atherosclerosis at the skull base. No hyperdense vessel. Skull: Scalp and calvarium demonstrate no acute finding. Sinuses/Orbits: Globes and orbital soft tissues within normal limits. Paranasal sinuses and mastoids remain clear. Other: None. IMPRESSION: 1. Continued interval evolution of subacute left MCA territory infarct with evidence for hemorrhagic transformation. Mildly decreased edema and regional mass effect as compared to previous. 2. No other new intracranial abnormality. Electronically Signed   By: Jeannine Boga M.D.   On: 01/31/2018 05:33   Ct Head Wo Contrast  Result Date: 01/24/2018 CLINICAL DATA:  Follow up stroke. History of hypertension, hyperlipidemia, neuroendocrine tumor. EXAM: CT HEAD WITHOUT CONTRAST TECHNIQUE: Contiguous axial images were obtained from the base of the skull through the vertex without intravenous contrast. COMPARISON:  CT/CTA HEAD January 24, 2018. FINDINGS: BRAIN: No intraparenchymal hemorrhage, mass effect nor midline shift. Hypodense LEFT basal ganglia, LEFT insular ribbon sign, loss of LEFT frontotemporal gray-white matter junction. Old LEFT cerebellar infarcts. No parenchymal brain volume loss for age. No hydrocephalus. No abnormal extra-axial fluid collections. Basal cisterns are patent. VASCULAR: Moderate calcific atherosclerosis of the carotid siphons. Dense LEFT insular MCA. SKULL: No skull fracture. Occluded RIGHT facial soft tissue swelling without subcutaneous gas or radiopaque foreign bodies. SINUSES/ORBITS: The mastoid air-cells and  included paranasal sinuses are well-aerated.The  included ocular globes and orbital contents are non-suspicious. Status post bilateral ocular lens implants. OTHER: None. IMPRESSION: 1. Evolving acute nonhemorrhagic LEFT MCA territory infarct. Dense LEFT MCA consistent with thromboembolism. 2. Old LEFT cerebellar infarct. Electronically Signed   By: Elon Alas M.D.   On: 01/24/2018 23:32   Ct Head Wo Contrast  Result Date: 01/24/2018 CLINICAL DATA:  Altered level of consciousness. History of neuroendocrine tumor EXAM: CT HEAD WITHOUT CONTRAST TECHNIQUE: Contiguous axial images were obtained from the base of the skull through the vertex without intravenous contrast. COMPARISON:  None. FINDINGS: Brain: Ill-defined hypodensity left MCA territory compatible with acute infarct. This involves the frontal and temporal lobe as well as some of the left parietal lobe. There is infarct involving the insula and basal ganglia. No associated hemorrhage. No midline shift. Ventricle size normal. Chronic infarct left cerebellum. Chronic microvascular ischemic change in the white matter. Vascular: Hyperdense distal left internal carotid artery and left MCA compatible with acute thrombosis. Skull: Negative Sinuses/Orbits: Paranasal sinuses clear.  Bilateral cataract surgery Other: None ASPECTS ([place] Stroke Program Early CT Score) - Ganglionic level infarction (caudate, lentiform nuclei, internal capsule, insula, M1-M3 cortex): 2 - Supraganglionic infarction (M4-M6 cortex): 2 Total score (0-10 with 10 being normal): 4 IMPRESSION: 1. Large territory acute left MCA infarct without hemorrhage. Hyperdense left internal carotid artery and left MCA compatible with acute thrombosis. 2. ASPECTS is 4 3. These results were called by telephone at the time of interpretation on 01/24/2018 at 11:50 am to Dr. Virgel Manifold , who verbally acknowledged these results. Electronically Signed   By: Franchot Gallo M.D.   On: 01/24/2018 11:50    Ct Angio Neck W Or Wo Contrast  Result Date: 01/24/2018 CLINICAL DATA:  77 y/o  F; acute stroke for follow-up. EXAM: CT ANGIOGRAPHY HEAD AND NECK CT PERFUSION BRAIN TECHNIQUE: Multidetector CT imaging of the head and neck was performed using the standard protocol during bolus administration of intravenous contrast. Multiplanar CT image reconstructions and MIPs were obtained to evaluate the vascular anatomy. Carotid stenosis measurements (when applicable) are obtained utilizing NASCET criteria, using the distal internal carotid diameter as the denominator. Multiphase CT imaging of the brain was performed following IV bolus contrast injection. Subsequent parametric perfusion maps were calculated using RAPID software. CONTRAST:  17mL ISOVUE-370 IOPAMIDOL (ISOVUE-370) INJECTION 76% COMPARISON:  01/24/2018 CT head. FINDINGS: CTA NECK FINDINGS Aortic arch: Bovine variant branching. Imaged portion shows no evidence of aneurysm or dissection. No significant stenosis of the major arch vessel origins. Mild calcific atherosclerosis. Right carotid system: No evidence of dissection, stenosis (50% or greater) or occlusion. Left carotid system: No evidence of dissection, stenosis (50% or greater) or occlusion. Vertebral arteries: Codominant. No evidence of dissection, stenosis (50% or greater) or occlusion. Skeleton: Moderate cervical spondylosis with multilevel disc and facet degenerative changes greatest at the C4-C7 levels. No high-grade bony canal stenosis. Other neck: Negative. Upper chest: Small right pleural effusion. Review of the MIP images confirms the above findings CTA HEAD FINDINGS Anterior circulation: Proximal left M1 occlusion with thrombus extending into the terminus with intermediate left MCA distribution collateralization. The thrombus appears approximately 8 mm in length (series 9, image 89). Mild calcific atherosclerosis of carotid siphons. Patent right MCA and bilateral ACA distributions with no  additional large vessel occlusion, aneurysm, or significant stenosis. Posterior circulation: No significant stenosis, proximal occlusion, aneurysm, or vascular malformation. Venous sinuses: As permitted by contrast timing, patent. Anatomic variants: Right fetal PCA.  Complete circle-of-Willis. Review of the MIP images  confirms the above findings CT Brain Perfusion Findings: CBF (<30%) Volume: 78mL Perfusion (Tmax>6.0s) volume: 181mL Mismatch Volume: 71mL Infarction Location:Left MCA distribution. IMPRESSION: 1. Left proximal M1 occlusion with thrombus measuring approximately 8 mm in length. Intermediate collaterals. 2. Left MCA distribution infarction with perfusion CT calculated core of 37 cc corresponding to the infarct on noncontrast CT. The calculated core may slightly underestimate the actual core as the region of infarct visible on noncontrast CT (ASPECTS 4) may have areas of pseudonormalization. 3. Large perfusion penumbra, 78 cc. 4. Patent carotid and vertebral arteries in the neck. No significant stenosis by NASCET criteria, dissection, or aneurysm. 5. Patent right MCA, bilateral ACA, and bilateral PCA distributions. These results were called by telephone at the time of interpretation on 01/24/2018 at 1:12 pm to Dr. Meryl Crutch , who verbally acknowledged these results. Electronically Signed   By: Kristine Garbe M.D.   On: 01/24/2018 13:25   Mr Brain Wo Contrast  Result Date: 01/25/2018 CLINICAL DATA:  Follow up stroke. History of hypertension, hyperlipidemia and neuroendocrine tumor. EXAM: MRI HEAD WITHOUT CONTRAST TECHNIQUE: Multiplanar, multiecho pulse sequences of the brain and surrounding structures were obtained without intravenous contrast. COMPARISON:  CT HEAD January 24, 2018 FINDINGS: INTRACRANIAL CONTENTS: Confluent reduced diffusion LEFT frontoparietal and temporal lobes including insula, reduced diffusion LEFT basal ganglia. Subcentimeter discontinuous reduced diffusion LEFT parietal  lobes. Areas of reduced diffusion demonstrate low ADC values, RIGHT FLAIR T2 hyperintense signal. Speckled susceptibility artifact LEFT frontal parietal lobes with additional chronic microhemorrhages bilateral occipital lobes and RIGHT parietal lobe. 2 mm LEFT-to-RIGHT midline shift. Patchy supratentorial white matter FLAIR T2 hyperintensities exclusive of the aforementioned abnormality compatible with mild chronic small vessel ischemic disease. Old LEFT cerebellar infarct. No parenchymal brain volume loss for age. No hydrocephalus. No abnormal extra-axial fluid collections. VASCULAR: Normal major intracranial vascular flow voids present at skull base. SKULL AND UPPER CERVICAL SPINE: No abnormal sellar expansion. No suspicious calvarial bone marrow signal. Craniocervical junction maintained. SINUSES/ORBITS: The mastoid air-cells and included paranasal sinuses are well-aerated.The included ocular globes and orbital contents are non-suspicious. Status post bilateral ocular lens implant. OTHER: Patient is edentulous. IMPRESSION: 1. 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 2 mm LEFT-to-RIGHT midline shift. 2. Susceptibility artifact seen with old PRES, chronic hypertension, less likely amyloid angiopathy. 3. Old LEFT cerebellar infarct. Electronically Signed   By: Elon Alas M.D.   On: 01/25/2018 06:10   Ct Cerebral Perfusion W Contrast  Result Date: 01/24/2018 CLINICAL DATA:  77 y/o  F; acute stroke for follow-up. EXAM: CT ANGIOGRAPHY HEAD AND NECK CT PERFUSION BRAIN TECHNIQUE: Multidetector CT imaging of the head and neck was performed using the standard protocol during bolus administration of intravenous contrast. Multiplanar CT image reconstructions and MIPs were obtained to evaluate the vascular anatomy. Carotid stenosis measurements (when applicable) are obtained utilizing NASCET criteria, using the distal internal carotid diameter as the denominator. Multiphase CT imaging of the  brain was performed following IV bolus contrast injection. Subsequent parametric perfusion maps were calculated using RAPID software. CONTRAST:  134mL ISOVUE-370 IOPAMIDOL (ISOVUE-370) INJECTION 76% COMPARISON:  01/24/2018 CT head. FINDINGS: CTA NECK FINDINGS Aortic arch: Bovine variant branching. Imaged portion shows no evidence of aneurysm or dissection. No significant stenosis of the major arch vessel origins. Mild calcific atherosclerosis. Right carotid system: No evidence of dissection, stenosis (50% or greater) or occlusion. Left carotid system: No evidence of dissection, stenosis (50% or greater) or occlusion. Vertebral arteries: Codominant. No evidence of dissection, stenosis (50% or greater)  or occlusion. Skeleton: Moderate cervical spondylosis with multilevel disc and facet degenerative changes greatest at the C4-C7 levels. No high-grade bony canal stenosis. Other neck: Negative. Upper chest: Small right pleural effusion. Review of the MIP images confirms the above findings CTA HEAD FINDINGS Anterior circulation: Proximal left M1 occlusion with thrombus extending into the terminus with intermediate left MCA distribution collateralization. The thrombus appears approximately 8 mm in length (series 9, image 89). Mild calcific atherosclerosis of carotid siphons. Patent right MCA and bilateral ACA distributions with no additional large vessel occlusion, aneurysm, or significant stenosis. Posterior circulation: No significant stenosis, proximal occlusion, aneurysm, or vascular malformation. Venous sinuses: As permitted by contrast timing, patent. Anatomic variants: Right fetal PCA.  Complete circle-of-Willis. Review of the MIP images confirms the above findings CT Brain Perfusion Findings: CBF (<30%) Volume: 64mL Perfusion (Tmax>6.0s) volume: 132mL Mismatch Volume: 10mL Infarction Location:Left MCA distribution. IMPRESSION: 1. Left proximal M1 occlusion with thrombus measuring approximately 8 mm in length.  Intermediate collaterals. 2. Left MCA distribution infarction with perfusion CT calculated core of 37 cc corresponding to the infarct on noncontrast CT. The calculated core may slightly underestimate the actual core as the region of infarct visible on noncontrast CT (ASPECTS 4) may have areas of pseudonormalization. 3. Large perfusion penumbra, 78 cc. 4. Patent carotid and vertebral arteries in the neck. No significant stenosis by NASCET criteria, dissection, or aneurysm. 5. Patent right MCA, bilateral ACA, and bilateral PCA distributions. These results were called by telephone at the time of interpretation on 01/24/2018 at 1:12 pm to Dr. Meryl Crutch , who verbally acknowledged these results. Electronically Signed   By: Kristine Garbe M.D.   On: 01/24/2018 13:25   Dg Chest Port 1 View  Result Date: 02/20/2018 CLINICAL DATA:  78 year old female with history of nausea. Difficulty walking. EXAM: PORTABLE CHEST 1 VIEW COMPARISON:  Chest x-ray 01/27/2018. FINDINGS: Lung volumes are normal. No consolidative airspace disease. No pleural effusions. Linear scarring in the periphery of the right mid lung is unchanged. No evidence of pulmonary edema. Mild to moderate cardiomegaly. Dilatation of the central pulmonary arteries, concerning for pulmonary arterial hypertension. Upper mediastinal contours are within normal limits. Aortic atherosclerosis. Status post mitral valve replacement. IMPRESSION: 1. No radiographic evidence of acute cardiopulmonary disease. 2. Mild-to-moderate cardiomegaly. 3. Aortic atherosclerosis. 4. Dilatation of the central pulmonary arteries, concerning for pulmonary arterial hypertension. Electronically Signed   By: Vinnie Langton M.D.   On: 02/20/2018 13:46   Dg Chest Port 1 View  Result Date: 01/27/2018 CLINICAL DATA:  History of previous CVA. Follow-up atelectasis and small right pneumothorax. EXAM: PORTABLE CHEST 1 VIEW COMPARISON:  PA and lateral chest x-ray of January 13, 2018  FINDINGS: The lungs are adequately inflated. No right-sided pneumothorax is observed. There is a small right pleural effusion. The left lung is mildly hyperinflated. The retrocardiac lung markings are increased. The cardiac silhouette is enlarged. The pulmonary vascularity is engorged. There is a prosthetic valve in the mitral position. There is calcification in the wall of the aortic arch. IMPRESSION: CHF superimposed upon COPD. Small right pleural effusion. Probable bibasilar atelectasis or pneumonia. No pneumothorax. Thoracic aortic atherosclerosis. Electronically Signed   By: David  Martinique M.D.   On: 01/27/2018 08:18    Microbiology: No results found for this or any previous visit (from the past 240 hour(s)).   Labs: Basic Metabolic Panel: Recent Labs  Lab 02/20/18 1211 02/21/18 0144  NA 139  --   K 3.9  --   CL 106  --  CO2 23  --   GLUCOSE 91  --   BUN 20  --   CREATININE 1.31*  --   CALCIUM 9.2  --   MG 1.6* 2.0   Liver Function Tests: Recent Labs  Lab 02/20/18 1211  AST 30  ALT 14  ALKPHOS 71  BILITOT 0.8  PROT 7.3  ALBUMIN 3.4*   CBC: Recent Labs  Lab 02/20/18 1211  WBC 8.1  NEUTROABS 5.4  HGB 12.9  HCT 38.7  MCV 94.4  PLT 305   Cardiac Enzymes: Recent Labs  Lab 02/20/18 1211 02/20/18 1533 02/20/18 1924 02/21/18 0144  TROPONINI 0.10* 0.09* 0.09* 0.10*    Signed:  Barton Dubois MD.  [facility] 02/21/2018, 12:52 PM

## 2018-02-22 ENCOUNTER — Ambulatory Visit: Payer: Self-pay | Admitting: Thoracic Surgery (Cardiothoracic Vascular Surgery)

## 2018-02-22 ENCOUNTER — Telehealth: Payer: Self-pay

## 2018-02-22 ENCOUNTER — Other Ambulatory Visit: Payer: Self-pay

## 2018-02-22 ENCOUNTER — Telehealth: Payer: Self-pay | Admitting: *Deleted

## 2018-02-22 ENCOUNTER — Emergency Department (HOSPITAL_COMMUNITY): Payer: Medicare Other

## 2018-02-22 ENCOUNTER — Emergency Department (HOSPITAL_COMMUNITY)
Admission: EM | Admit: 2018-02-22 | Discharge: 2018-02-22 | Disposition: A | Discharge status: Home / Self Care | Payer: Medicare Other | Attending: Emergency Medicine | Admitting: Emergency Medicine

## 2018-02-22 ENCOUNTER — Encounter (HOSPITAL_COMMUNITY): Payer: Self-pay | Admitting: Emergency Medicine

## 2018-02-22 DIAGNOSIS — R079 Chest pain, unspecified: Secondary | ICD-10-CM | POA: Diagnosis not present

## 2018-02-22 DIAGNOSIS — Z96652 Presence of left artificial knee joint: Secondary | ICD-10-CM | POA: Diagnosis not present

## 2018-02-22 DIAGNOSIS — Z79899 Other long term (current) drug therapy: Secondary | ICD-10-CM | POA: Insufficient documentation

## 2018-02-22 DIAGNOSIS — Z8673 Personal history of transient ischemic attack (TIA), and cerebral infarction without residual deficits: Secondary | ICD-10-CM | POA: Insufficient documentation

## 2018-02-22 DIAGNOSIS — Z96651 Presence of right artificial knee joint: Secondary | ICD-10-CM | POA: Insufficient documentation

## 2018-02-22 DIAGNOSIS — I5032 Chronic diastolic (congestive) heart failure: Secondary | ICD-10-CM | POA: Insufficient documentation

## 2018-02-22 DIAGNOSIS — R0789 Other chest pain: Secondary | ICD-10-CM | POA: Insufficient documentation

## 2018-02-22 DIAGNOSIS — N183 Chronic kidney disease, stage 3 (moderate): Secondary | ICD-10-CM | POA: Diagnosis not present

## 2018-02-22 DIAGNOSIS — Z7901 Long term (current) use of anticoagulants: Secondary | ICD-10-CM | POA: Diagnosis not present

## 2018-02-22 DIAGNOSIS — I130 Hypertensive heart and chronic kidney disease with heart failure and stage 1 through stage 4 chronic kidney disease, or unspecified chronic kidney disease: Secondary | ICD-10-CM | POA: Diagnosis not present

## 2018-02-22 LAB — BASIC METABOLIC PANEL
Anion gap: 11 (ref 5–15)
BUN: 18 mg/dL (ref 8–23)
CO2: 23 mmol/L (ref 22–32)
Calcium: 9.5 mg/dL (ref 8.9–10.3)
Chloride: 105 mmol/L (ref 98–111)
Creatinine, Ser: 1.48 mg/dL — ABNORMAL HIGH (ref 0.44–1.00)
GFR calc Af Amer: 38 mL/min — ABNORMAL LOW (ref >60)
GFR calc non Af Amer: 33 mL/min — ABNORMAL LOW (ref >60)
Glucose, Bld: 107 mg/dL — ABNORMAL HIGH (ref 70–99)
Potassium: 3.7 mmol/L (ref 3.5–5.1)
Sodium: 139 mmol/L (ref 135–145)

## 2018-02-22 LAB — I-STAT TROPONIN, ED: Troponin i, poc: 0.12 ng/mL (ref 0.00–0.08)

## 2018-02-22 LAB — CBC
HCT: 40.5 % (ref 36.0–46.0)
Hemoglobin: 13.2 g/dL (ref 12.0–15.0)
MCH: 30.6 pg (ref 26.0–34.0)
MCHC: 32.6 g/dL (ref 30.0–36.0)
MCV: 94 fL (ref 78.0–100.0)
Platelets: 257 10*3/uL (ref 150–400)
RBC: 4.31 MIL/uL (ref 3.87–5.11)
RDW: 13.4 % (ref 11.5–15.5)
WBC: 7.3 10*3/uL (ref 4.0–10.5)

## 2018-02-22 LAB — URINE CULTURE: Culture: NO GROWTH

## 2018-02-22 NOTE — Discharge Instructions (Signed)
Take tylenol 1000mg(2 extra strength) four times a day.  ° °

## 2018-02-22 NOTE — Telephone Encounter (Signed)
[name] daughter, Alyse Low called this morning stating that she was unable to get her mother up out of the bed.  She was just patting her chest/ shoulder in pain.  She stated that she was not responding to her.  She mentioned tearfully that her mother was "tired of everything".  I advised her to hang up and call 911 to get her mother cared for.  She acknowledged receipt.  Dr. Roxy Manns notified that patient was not going to make it to her appointment today.  He acknowledged receipt.

## 2018-02-22 NOTE — Telephone Encounter (Signed)
Brooke Chavez ST called for verbal orders on Brooke Chavez for 1wk1, 2wk 7 for aphasia. I told her we would be signing orders, but Brooke Chavez is readmitted since 02/20/18 currently.

## 2018-02-22 NOTE — ED Provider Notes (Signed)
[facility] EMERGENCY DEPARTMENT Provider Note   CSN: [id number] Arrival date & time: 02/22/18  1245     History   Chief Complaint Chief Complaint  Patient presents with  . Chest Pain    HPI Brooke Chavez is a 77 y.o. female.  77 yo F with a chief complaint of chest pain.  History is limited because the patient is aphasic.  This started last night and worsened until today.  Seems to be worse with movement and palpation and twisting.  Patient nods yes to sharp when asked.  Denies any trauma.  Denies cough.  The history is provided by the patient. History limited by: aphasia.  Chest Pain   This is a new problem. The current episode started yesterday. The problem occurs constantly. The problem has been gradually worsening. The pain is associated with movement. The pain is present in the substernal region. The pain is at a severity of 7/10. The pain is moderate. The quality of the pain is described as brief. The pain does not radiate. Duration of episode(s) is 12 hours. Pertinent negatives include no dizziness, no fever, no headaches, no nausea, no palpitations, no shortness of breath and no vomiting. She has tried nothing for the symptoms. The treatment provided no relief.    Past Medical History:  Diagnosis Date  . Atrial flutter ([place])    Typical by EKG diagnosis 9/11 s/p CIT ablation 11/11  . Bradycardia   . Chronic diastolic congestive heart failure ([place])   . DJD (degenerative joint disease)   . Dysrhythmia   . Femur fracture, left ([place]) 2013  . Heart murmur   . Hyperlipidemia    x5 years  . Hypertension    Since 1997  . Liver masses 11/13/2017   Multiple small nodules seen on CT and MRI  . Mitral regurgitation    severe  . 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 TR (tricuspid regurgitation)    Mild with RA enlargment    Patient Active Problem List   Diagnosis Date Noted  . Junctional bradycardia   . Elevated troponin 02/20/2018  . Hypomagnesemia 02/20/2018  . Atrial fibrillation ([place])   . Hypokalemia   . Acute lower UTI   . H/O mitral valve repair   . Benign essential HTN   . History of atrial fibrillation   . Stage 3 chronic kidney disease ([place])   . Hypoalbuminemia due to protein-calorie malnutrition ([place])   . Global aphasia   . Acute blood loss anemia   . 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 Gait disturbance, post-stroke   . Dysphagia, post-stroke   . Advance care planning   . Goals of care, counseling/discussion   . Palliative care by specialist   . Acute ischemic left MCA stroke ([place]) 01/24/2018  . Stroke (cerebrum) ([place]) 01/24/2018  . Encounter for therapeutic drug monitoring 01/18/2018  . 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 Chronic diastolic congestive heart failure ([place])   . 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 Liver masses 11/13/2017  . BMI 30.0-30.9,adult 07/24/2015  . Osteoporosis 09/19/2014  . GERD (gastroesophageal reflux disease) 07/29/2011  .  PAF (paroxysmal atrial fibrillation) ([place]) 05/29/2010  . Bradycardia 01/09/2009  . Hyperlipidemia with target LDL less than 100 01/04/2009  . Essential hypertension 01/04/2009    Past Surgical History:  Procedure Laterality Date  . A FLUTTER ABLATION N/A 06/27/2011   Procedure: ABLATION A FLUTTER;  Surgeon: Thompson Grayer, MD;  Location: [facility] CATH LAB;  Service: Cardiovascular;  Laterality: N/A;  . ATRIAL ABLATION SURGERY  06/2010  . HIP SURGERY     Left (fracture) 3/13  . JOINT REPLACEMENT     both knees   . KNEE ARTHROSCOPY     Right  . LUMBAR SPINE SURGERY    . MITRAL VALVE REPAIR  Right 01/07/2018   Procedure: MINIMALLY INVASIVE MITRAL VALVE REPAIR using LivaNova ring size 28 MM;  Surgeon: Rexene Alberts, MD;  Location: [facility];  Service: Open Heart Surgery;  Laterality: Right;  . PATENT FORAMEN OVALE(PFO) CLOSURE N/A 01/07/2018   Procedure: PATENT FORAMEN OVALE (PFO) CLOSURE;  Surgeon: Rexene Alberts, MD;  Location: [facility] [facility];  Service: Open Heart Surgery;  Laterality: N/A;  . RIGHT/LEFT HEART CATH AND CORONARY ANGIOGRAPHY N/A 11/11/2017   Procedure: RIGHT/LEFT HEART CATH AND CORONARY ANGIOGRAPHY;  Surgeon: Sherren Mocha, MD;  Location: [facility] CV LAB;  Service: Cardiovascular;  Laterality: N/A;  . TEE WITHOUT CARDIOVERSION N/A 09/18/2017   Procedure: TRANSESOPHAGEAL ECHOCARDIOGRAM (TEE);  Surgeon: Skeet Latch, MD;  Location: [facility];  Service: Cardiovascular;  Laterality: N/A;  . TEE WITHOUT CARDIOVERSION N/A 01/07/2018   Procedure: TRANSESOPHAGEAL ECHOCARDIOGRAM (TEE);  Surgeon: Rexene Alberts, MD;  Location: [facility];  Service: Open Heart Surgery;  Laterality: N/A;  . TOTAL KNEE ARTHROPLASTY     Left  . TUBAL LIGATION       OB History   None      Home Medications    Prior to Admission medications   Medication Sig Start Date End Date Taking? Authorizing Provider  acetaminophen (TYLENOL) 325 MG tablet Take 2 tablets (650 mg total) by mouth every 6 (six) hours as needed for mild pain (or Fever >/= 101). 02/12/18   Angiulli, Lavon Paganini, PA-C  apixaban (ELIQUIS) 5 MG TABS tablet Take 1 tablet (5 mg total) by mouth 2 (two) times daily. 02/12/18   Angiulli, Lavon Paganini, PA-C  atorvastatin (LIPITOR) 40 MG tablet Take 1 tablet (40 mg total) by mouth at bedtime. 02/12/18   Angiulli, Lavon Paganini, PA-C  bismuth subsalicylate (PEPTO BISMOL) 262 MG chewable tablet Chew 524 mg by mouth as needed for diarrhea or loose stools.    [provider]  ferrous sulfate 325 (65 FE) MG tablet Take 1 tablet (325 mg total) by mouth daily with breakfast. For one month then stop. 02/12/18  02/12/19  Angiulli, Lavon Paganini, PA-C  hydrochlorothiazide (MICROZIDE) 12.5 MG capsule Take 1 capsule (12.5 mg total) by mouth daily. 02/12/18   Angiulli, Lavon Paganini, PA-C  magnesium oxide (MAG-OX) 400 MG tablet Take 1 tablet (400 mg total) by mouth daily. 02/21/18   Barton Dubois, MD  meclizine (ANTIVERT) 12.5 MG tablet Take 1 tablet (12.5 mg total) by mouth 3 (three) times daily as needed for dizziness. 02/21/18   Barton Dubois, MD  potassium chloride (K-DUR,KLOR-CON) 10 MEQ tablet Take 1 tablet (10 mEq total) by mouth daily. 02/12/18   Angiulli, Lavon Paganini, PA-C  famotidine (PEPCID) 20 MG tablet Take 1 tablet (20 mg total) by mouth 2 (two) times daily. 07/29/11 10/29/11  Liliane Shi, PA-C    Family History Family History  Problem Relation  Age of Onset  . Hypertension Mother   . Cancer Sister        leukemia  . Diabetes Brother   . Stroke Sister   . Diabetes Brother   . Heart attack Brother   . Healthy Daughter   . Healthy Son   . Healthy Daughter     Social History Social History   Tobacco Use  . Smoking status: Never Smoker  . Smokeless tobacco: Never Used  Substance Use Topics  . Alcohol use: No  . Drug use: No     Allergies   Oxycodone-acetaminophen and Aspirin   Review of Systems Review of Systems  Constitutional: Negative for chills and fever.  HENT: Negative for congestion and rhinorrhea.   Eyes: Negative for redness and visual disturbance.  Respiratory: Negative for shortness of breath and wheezing.   Cardiovascular: Positive for chest pain. Negative for palpitations.  Gastrointestinal: Negative for nausea and vomiting.  Genitourinary: Negative for dysuria and urgency.  Musculoskeletal: Negative for arthralgias and myalgias.  Skin: Negative for pallor and wound.  Neurological: Negative for dizziness and headaches.     Physical Exam Updated Vital Signs BP 135/72   Pulse 76   Resp 16   LMP 11/05/1992   SpO2 97%   Physical Exam  Constitutional: She appears  well-developed and well-nourished. No distress.  HENT:  Head: Normocephalic and atraumatic.  Eyes: Pupils are equal, round, and reactive to light. EOM are normal.  Neck: Normal range of motion. Neck supple.  Cardiovascular: Normal rate and regular rhythm. Exam reveals no gallop and no friction rub.  No murmur heard. Pulmonary/Chest: Effort normal. She has no wheezes. She has no rales. She exhibits tenderness.  Abdominal: Soft. She exhibits no distension. There is no tenderness.  Musculoskeletal: She exhibits no edema or tenderness.  Neurological: She is alert.  Aphasic   Skin: Skin is warm and dry. She is not diaphoretic.  Psychiatric: She has a normal mood and affect. Her behavior is normal.  Nursing note and vitals reviewed.    ED Treatments / Results  Labs (all labs ordered are listed, but only abnormal results are displayed) Labs Reviewed  BASIC METABOLIC PANEL - Abnormal; Notable for the following components:      Result Value   Glucose, Bld 107 (*)    Creatinine, Ser 1.48 (*)    GFR calc non Af Amer 33 (*)    GFR calc Af Amer 38 (*)    All other components within normal limits  I-STAT TROPONIN, ED - Abnormal; Notable for the following components:   Troponin i, poc 0.12 (*)    All other components within normal limits  CBC    EKG EKG Interpretation  Date/Time:  Monday February 22 2018 13:00:20 EDT Ventricular Rate:  79 PR Interval:  136 QRS Duration: 128 QT Interval:  422 QTC Calculation: 483 R Axis:   -60 Text Interpretation:  Normal sinus rhythm Left axis deviation Non-specific intra-ventricular conduction block T wave abnormality, consider lateral ischemia Abnormal ECG st changes in v5,6, seen prior with flipped t's but now with  more pronounced depression Confirmed by Deno Etienne [phone]) on 02/22/2018 1:03:30 PM   Radiology Dg Chest 2 View  Result Date: 02/22/2018 CLINICAL DATA:  Chest pain and left shoulder pain. EXAM: CHEST - 2 VIEW COMPARISON:  02/20/2018  FINDINGS: There changes from previous mitral valve replacement. Cardiac silhouette is mild-to-moderately enlarged. No mediastinal or hilar masses. No convincing adenopathy. Stable scarring in the right mid lung.  Lungs  otherwise clear. No pleural effusion or pneumothorax. Mild chronic compression fracture of L2 stable from a CT dated 11/13/2017 IMPRESSION: No acute cardiopulmonary disease. Electronically Signed   By: Lajean Manes M.D.   On: 02/22/2018 13:22    Procedures Procedures (including critical care time)  Medications Ordered in ED Medications - No data to display   Initial Impression / Assessment and Plan / ED Course  I have reviewed the triage vital signs and the nursing notes.  Pertinent labs & imaging results that were available during my care of the patient were reviewed by me and considered in my medical decision making (see chart for details).     77 yo F with a cc of chest pain.  This is been going on for the past day.  Difficult to communicate due to aphasia secondary to recent stroke however the patient nods yes when I say that sharp and is worse with palpation.  The EKG looks changed to me with more prominent depressions in V5 and V6.  Will discuss with cards.   I discussed the case with Dr. Debara Pickett, cardiology he independently evaluated the ECG he felt that the ST changes in the lateral leads for more likely repolarization.  He reviewed her old catheterization report and felt that with clean coronaries 4 months ago she likely did not need further work-up.  I will have her follow-up with her cardiologist.  Discharge home.  4:04 PM:  I have discussed the diagnosis/risks/treatment options with the patient and family and believe the pt to be eligible for discharge home to follow-up with PCP, Cards. We also discussed returning to the ED immediately if new or worsening sx occur. We discussed the sx which are most concerning (e.g., sudden worsening pain, fever, inability to tolerate  by mouth) that necessitate immediate return. Medications administered to the patient during their visit and any new prescriptions provided to the patient are listed below.  Medications given during this visit Medications - No data to display    The patient appears reasonably screen and/or stabilized for discharge and I doubt any other medical condition or other [facility] requiring further screening, evaluation, or treatment in the ED at this time prior to discharge.    Final Clinical Impressions(s) / ED Diagnoses   Final diagnoses:  Atypical chest pain    ED Discharge Orders    None       Deno Etienne, DO 02/22/18 1604

## 2018-02-22 NOTE — ED Triage Notes (Signed)
Patient complains of chest pain that started today, had valve replacement on May 30 this year, stroke on June 16 this year. Patient has severe difficulty speaking after stroke, points right to left shoulder and shakes head yes that she is having pain. Patient alert and in no apparent distress at this time.

## 2018-02-22 NOTE — ED Notes (Signed)
I Stat Trop I results of 0.12 reported to Dr. Tyrone Nine

## 2018-02-23 DIAGNOSIS — I69351 Hemiplegia and hemiparesis following cerebral infarction affecting right dominant side: Secondary | ICD-10-CM | POA: Diagnosis not present

## 2018-02-23 DIAGNOSIS — I5032 Chronic diastolic (congestive) heart failure: Secondary | ICD-10-CM | POA: Diagnosis not present

## 2018-02-23 DIAGNOSIS — I130 Hypertensive heart and chronic kidney disease with heart failure and stage 1 through stage 4 chronic kidney disease, or unspecified chronic kidney disease: Secondary | ICD-10-CM | POA: Diagnosis not present

## 2018-02-23 DIAGNOSIS — I480 Paroxysmal atrial fibrillation: Secondary | ICD-10-CM | POA: Diagnosis not present

## 2018-02-23 DIAGNOSIS — N183 Chronic kidney disease, stage 3 (moderate): Secondary | ICD-10-CM | POA: Diagnosis not present

## 2018-02-23 DIAGNOSIS — I69320 Aphasia following cerebral infarction: Secondary | ICD-10-CM | POA: Diagnosis not present

## 2018-02-25 ENCOUNTER — Other Ambulatory Visit: Payer: Self-pay | Admitting: *Deleted

## 2018-02-25 DIAGNOSIS — I69351 Hemiplegia and hemiparesis following cerebral infarction affecting right dominant side: Secondary | ICD-10-CM | POA: Diagnosis not present

## 2018-02-25 DIAGNOSIS — I130 Hypertensive heart and chronic kidney disease with heart failure and stage 1 through stage 4 chronic kidney disease, or unspecified chronic kidney disease: Secondary | ICD-10-CM | POA: Diagnosis not present

## 2018-02-25 DIAGNOSIS — I480 Paroxysmal atrial fibrillation: Secondary | ICD-10-CM | POA: Diagnosis not present

## 2018-02-25 DIAGNOSIS — I5032 Chronic diastolic (congestive) heart failure: Secondary | ICD-10-CM | POA: Diagnosis not present

## 2018-02-25 DIAGNOSIS — N183 Chronic kidney disease, stage 3 (moderate): Secondary | ICD-10-CM | POA: Diagnosis not present

## 2018-02-25 DIAGNOSIS — I69320 Aphasia following cerebral infarction: Secondary | ICD-10-CM | POA: Diagnosis not present

## 2018-02-25 NOTE — Patient Outreach (Signed)
[facility] Care Management  02/25/2018  Brooke Chavez 01-04-1941 [id number]  Referral via RED Alert-EMMI-Stroke-Day#9, 02/24/2018: Reason- Feeling worse overall-yes New Problems-yes  Telephone call to patient-sister caregiver answered call & advised that caregivers take all of patient's calls because she has problems with speech after having stroke. HIPPA verification received from caregiver-sister(Brooke Chavez).  Caregiver states patient is feeling better and no new problems had developed.  Caregiver states she is aware that she should 911 if patient has further symptoms of stroke and other emergency symptoms. . States she will report non urgent symptoms to MD office.    States patient currently has home health services of physical therapy, speech therapy and occupational therapy. States patient is currently using walker to get around. .   States she and other family member prepare patient's medication and make sure that patient gets her medications as ordered by her doctors. States her follow up appointment with primary care provider is next week. States family provides transportation for patient. Caregiver states she is taking patient's BP & pulse and is recording it. Caregiver wanted to know what normal pulse rate was. Caregiver was given normal range for pulse rate and advised to talk to MD office if rate was not within normal range.  Caregiver voices understanding and states she will take recording of vital signs to patient MD appointments.  No further concerns at this time. EMMI call has been addressed.  Plan: Case closure.  Sherrin Daisy, RN BSN [place] Management Coordinator [facility] Care Management  [phone]

## 2018-02-26 ENCOUNTER — Encounter: Payer: Medicare Other | Admitting: Physical Medicine & Rehabilitation

## 2018-02-26 ENCOUNTER — Telehealth: Payer: Self-pay | Admitting: Physical Medicine & Rehabilitation

## 2018-02-26 ENCOUNTER — Telehealth: Payer: Self-pay | Admitting: Nurse Practitioner

## 2018-02-26 NOTE — Telephone Encounter (Signed)
Ok sounds appropriate

## 2018-02-26 NOTE — Telephone Encounter (Signed)
New Message  Pts daughter verbalized medication could possibly cause patients energy to be drained she cannot get out of bed.   Pts daughter verbalized every other day is good and every other day is not good.   Pt is drained with no energry.   Pt started on magnesium Saturday when she was at the ED.  Please f/u

## 2018-02-26 NOTE — Telephone Encounter (Signed)
I spoke with Mrs Mullarkey's daugther.  She was describing her various trips to the ED x 2 with complaints which range from no energy to chest pain. She was observed once and not kept the second time. She had an appt with Dr Letta Pate this afternoon but they cancelled it (same day). I have explained that she needs to address these issues with her primary Care since they cancelled their appt with Dr Letta Pate today.. It looks like she saw Kerin Ransom @Cardiology  office on 02/17/18 and has appt with [name] (pcp) on Tuesday 03/02/18.

## 2018-02-26 NOTE — Telephone Encounter (Signed)
Spoke with pt's caregiver [name] who is concerned stating pt is tired and not wanting to get up. Offered appt for pt to be seen but she declined stating pt has appt with MMM on Tuesday and would just keep that appt.

## 2018-03-02 ENCOUNTER — Ambulatory Visit (INDEPENDENT_AMBULATORY_CARE_PROVIDER_SITE_OTHER): Payer: Medicare Other | Admitting: Nurse Practitioner

## 2018-03-02 ENCOUNTER — Encounter: Payer: Self-pay | Admitting: Nurse Practitioner

## 2018-03-02 ENCOUNTER — Telehealth: Payer: Self-pay | Admitting: Nurse Practitioner

## 2018-03-02 VITALS — BP 139/86 | HR 74 | Temp 98.0°F | Ht 61.0 in | Wt 144.0 lb

## 2018-03-02 DIAGNOSIS — I6930 Unspecified sequelae of cerebral infarction: Secondary | ICD-10-CM | POA: Diagnosis not present

## 2018-03-02 DIAGNOSIS — N183 Chronic kidney disease, stage 3 (moderate): Secondary | ICD-10-CM | POA: Diagnosis not present

## 2018-03-02 DIAGNOSIS — I10 Essential (primary) hypertension: Secondary | ICD-10-CM | POA: Diagnosis not present

## 2018-03-02 DIAGNOSIS — R5383 Other fatigue: Secondary | ICD-10-CM | POA: Diagnosis not present

## 2018-03-02 DIAGNOSIS — R6889 Other general symptoms and signs: Secondary | ICD-10-CM | POA: Diagnosis not present

## 2018-03-02 DIAGNOSIS — I480 Paroxysmal atrial fibrillation: Secondary | ICD-10-CM | POA: Diagnosis not present

## 2018-03-02 DIAGNOSIS — I69351 Hemiplegia and hemiparesis following cerebral infarction affecting right dominant side: Secondary | ICD-10-CM | POA: Diagnosis not present

## 2018-03-02 DIAGNOSIS — I5032 Chronic diastolic (congestive) heart failure: Secondary | ICD-10-CM | POA: Diagnosis not present

## 2018-03-02 DIAGNOSIS — I69320 Aphasia following cerebral infarction: Secondary | ICD-10-CM | POA: Diagnosis not present

## 2018-03-02 DIAGNOSIS — I130 Hypertensive heart and chronic kidney disease with heart failure and stage 1 through stage 4 chronic kidney disease, or unspecified chronic kidney disease: Secondary | ICD-10-CM | POA: Diagnosis not present

## 2018-03-02 MED ORDER — SPIRONOLACTONE 25 MG PO TABS: 25.0000 mg | ORAL_TABLET | Freq: Every day | ORAL | 3 refills | Status: DC

## 2018-03-02 NOTE — Telephone Encounter (Signed)
Brooke Chavez, patient's emergency contact, wanted to verify that she should continue potassium 55meq since she was started on aldactone.   (Aware that Shelah Lewandowsky is off on Wednesday)

## 2018-03-02 NOTE — Patient Instructions (Signed)

## 2018-03-02 NOTE — Progress Notes (Signed)
   Subjective:    Patient ID: Brooke Chavez, female    DOB: 06/17/1941, 77 y.o.   MRN: [id number]   Chief Complaint: Hospitalization Follow-up (daughter concerned that she is sleeping a lot since she has came home from the hospital, weakness, holding her head a lot like she has a headache; two ER visits since being discharged from the hospital)   HPI Patient comes in today for hospital follow up. She had a heart valve replacement on May 30,2019  And she had a stroke after surgery. Has right sided weakness. Was back in hospital at [facility] on 02/20/18 with fluctuating heart rate and weakness. She was discharged home with all negative findings. She went back to hospital 2 days later with chest pain. Heart continues to check out fine and they said she was having chest wall spasms. Since then she has been very fatigued . Wants to sleep a lot and just does not feel like doing anything. The only change to meds was added hctz 12.5 for blood pressure control.    Review of Systems     Objective:   Physical Exam  Constitutional: She appears well-developed and well-nourished. No distress.  HENT:  Left facial drooping  Cardiovascular: Normal rate.  Pulmonary/Chest: Effort normal.  Abdominal: Soft.  Musculoskeletal:  Right sided ext weakness  Skin: Skin is warm and dry.  Psychiatric: She has a normal mood and affect. Her behavior is normal. Thought content normal.    BP 139/86   Pulse 74   Temp 98 F (36.7 C) (Oral)   Ht 5' 1" (1.549 m)   Wt 144 lb (65.3 kg)   LMP 11/05/1992   BMI 27.21 kg/m      Assessment & Plan:  [name] in today with chief complaint of Hospitalization Follow-up (daughter concerned that she is sleeping a lot since she has came home from the hospital, weakness, holding her head a lot like she has a headache; two ER visits since being discharged from the hospital)   1. Late effect of cerebrovascular accident (CVA) Changed from HCTZ to spirolactine because she  did better on that in past - CBC with Differential/Platelet - CMP14+EGFR - spironolactone (ALDACTONE) 25 MG tablet; Take 1 tablet (25 mg total) by mouth daily.  Dispense: 90 tablet; Refill: 3  2. Other fatigue Labs pending - Vitamin B12  Labs pending Encouraged exercise  3. Hospital follow up Records reviewed  Mary-Margaret Martin, FNP  

## 2018-03-03 ENCOUNTER — Ambulatory Visit (INDEPENDENT_AMBULATORY_CARE_PROVIDER_SITE_OTHER): Payer: Medicare Other

## 2018-03-03 DIAGNOSIS — I69320 Aphasia following cerebral infarction: Secondary | ICD-10-CM | POA: Diagnosis not present

## 2018-03-03 DIAGNOSIS — I69351 Hemiplegia and hemiparesis following cerebral infarction affecting right dominant side: Secondary | ICD-10-CM

## 2018-03-03 DIAGNOSIS — I69391 Dysphagia following cerebral infarction: Secondary | ICD-10-CM

## 2018-03-03 DIAGNOSIS — I480 Paroxysmal atrial fibrillation: Secondary | ICD-10-CM | POA: Diagnosis not present

## 2018-03-03 DIAGNOSIS — I130 Hypertensive heart and chronic kidney disease with heart failure and stage 1 through stage 4 chronic kidney disease, or unspecified chronic kidney disease: Secondary | ICD-10-CM

## 2018-03-03 DIAGNOSIS — Z7901 Long term (current) use of anticoagulants: Secondary | ICD-10-CM

## 2018-03-03 DIAGNOSIS — I5032 Chronic diastolic (congestive) heart failure: Secondary | ICD-10-CM | POA: Diagnosis not present

## 2018-03-03 DIAGNOSIS — R1310 Dysphagia, unspecified: Secondary | ICD-10-CM | POA: Diagnosis not present

## 2018-03-03 DIAGNOSIS — N183 Chronic kidney disease, stage 3 (moderate): Secondary | ICD-10-CM | POA: Diagnosis not present

## 2018-03-03 DIAGNOSIS — I4892 Unspecified atrial flutter: Secondary | ICD-10-CM

## 2018-03-03 DIAGNOSIS — M810 Age-related osteoporosis without current pathological fracture: Secondary | ICD-10-CM

## 2018-03-03 LAB — CMP14+EGFR
ALT: 13 IU/L (ref 0–32)
AST: 22 IU/L (ref 0–40)
Albumin/Globulin Ratio: 1.4 (ref 1.2–2.2)
Albumin: 4.1 g/dL (ref 3.5–4.8)
Alkaline Phosphatase: 82 IU/L (ref 39–117)
BUN/Creatinine Ratio: 10 — ABNORMAL LOW (ref 12–28)
BUN: 15 mg/dL (ref 8–27)
Bilirubin Total: 0.5 mg/dL (ref 0.0–1.2)
CO2: 24 mmol/L (ref 20–29)
Calcium: 9.7 mg/dL (ref 8.7–10.3)
Chloride: 100 mmol/L (ref 96–106)
Creatinine, Ser: 1.44 mg/dL — ABNORMAL HIGH (ref 0.57–1.00)
GFR calc Af Amer: 40 mL/min/{1.73_m2} — ABNORMAL LOW (ref >59)
GFR calc non Af Amer: 35 mL/min/{1.73_m2} — ABNORMAL LOW (ref >59)
Globulin, Total: 3 g/dL (ref 1.5–4.5)
Glucose: 101 mg/dL — ABNORMAL HIGH (ref 65–99)
Potassium: 3.8 mmol/L (ref 3.5–5.2)
Sodium: 142 mmol/L (ref 134–144)
Total Protein: 7.1 g/dL (ref 6.0–8.5)

## 2018-03-03 LAB — CBC WITH DIFFERENTIAL/PLATELET
Basophils Absolute: 0 10*3/uL (ref 0.0–0.2)
Basos: 0 %
EOS (ABSOLUTE): 0.1 10*3/uL (ref 0.0–0.4)
Eos: 1 %
Hematocrit: 42.2 % (ref 34.0–46.6)
Hemoglobin: 13.3 g/dL (ref 11.1–15.9)
Immature Grans (Abs): 0 10*3/uL (ref 0.0–0.1)
Immature Granulocytes: 0 %
Lymphocytes Absolute: 1.6 10*3/uL (ref 0.7–3.1)
Lymphs: 22 %
MCH: 29.6 pg (ref 26.6–33.0)
MCHC: 31.5 g/dL (ref 31.5–35.7)
MCV: 94 fL (ref 79–97)
Monocytes Absolute: 0.5 10*3/uL (ref 0.1–0.9)
Monocytes: 7 %
Neutrophils Absolute: 5.2 10*3/uL (ref 1.4–7.0)
Neutrophils: 70 %
Platelets: 235 10*3/uL (ref 150–450)
RBC: 4.49 x10E6/uL (ref 3.77–5.28)
RDW: 13.5 % (ref 12.3–15.4)
WBC: 7.4 10*3/uL (ref 3.4–10.8)

## 2018-03-03 LAB — VITAMIN B12: Vitamin B-12: 521 pg/mL (ref 232–1245)

## 2018-03-04 ENCOUNTER — Telehealth: Payer: Self-pay | Admitting: Cardiology

## 2018-03-04 ENCOUNTER — Telehealth: Payer: Self-pay | Admitting: Nurse Practitioner

## 2018-03-04 NOTE — Telephone Encounter (Signed)
Protein shakes from [employer]

## 2018-03-04 NOTE — Telephone Encounter (Signed)
Left message to call back  

## 2018-03-04 NOTE — Telephone Encounter (Signed)
Follow up    Patients daughter is returning call. Please call

## 2018-03-04 NOTE — Telephone Encounter (Signed)
Patient's daughter notified.

## 2018-03-04 NOTE — Telephone Encounter (Signed)
Yes just have her come into check potassium level in 2 weeks

## 2018-03-04 NOTE — Telephone Encounter (Signed)
New Message   Pt c/o medication issue:  1. Name of Medication: spironolactone (ALDACTONE) 25 MG tablet  2. How are you currently taking this medication (dosage and times per day)? Take 1 tablet (25 mg total) by mouth daily  3. Are you having a reaction (difficulty breathing--STAT)? no  4. What is your medication issue? Pt daughter is calling states that the pcp put her on this medication and they want to make sure its ok. Please call

## 2018-03-04 NOTE — Telephone Encounter (Signed)
Please review and advise.

## 2018-03-04 NOTE — Telephone Encounter (Signed)
In the process of getting POA pt had a stroke after surgery and reviewed records and pt had been taking Spironolactone previous and daughter aware PMD can review pt's med list ./cy

## 2018-03-04 NOTE — Telephone Encounter (Signed)
Pt notified to come in for labs in 2 weeks Verbalizes understanding

## 2018-03-05 ENCOUNTER — Ambulatory Visit (INDEPENDENT_AMBULATORY_CARE_PROVIDER_SITE_OTHER): Payer: Medicare Other | Admitting: Pediatrics

## 2018-03-05 ENCOUNTER — Encounter: Payer: Self-pay | Admitting: Pediatrics

## 2018-03-05 ENCOUNTER — Other Ambulatory Visit: Payer: Self-pay | Admitting: Pediatrics

## 2018-03-05 ENCOUNTER — Ambulatory Visit (INDEPENDENT_AMBULATORY_CARE_PROVIDER_SITE_OTHER): Payer: Medicare Other

## 2018-03-05 VITALS — BP 135/85 | HR 87 | Temp 98.2°F | Ht 61.0 in

## 2018-03-05 DIAGNOSIS — W19XXXA Unspecified fall, initial encounter: Secondary | ICD-10-CM

## 2018-03-05 DIAGNOSIS — M25551 Pain in right hip: Secondary | ICD-10-CM | POA: Diagnosis not present

## 2018-03-05 DIAGNOSIS — M545 Low back pain: Secondary | ICD-10-CM

## 2018-03-05 DIAGNOSIS — S3992XA Unspecified injury of lower back, initial encounter: Secondary | ICD-10-CM | POA: Diagnosis not present

## 2018-03-05 DIAGNOSIS — S79911A Unspecified injury of right hip, initial encounter: Secondary | ICD-10-CM | POA: Diagnosis not present

## 2018-03-05 DIAGNOSIS — M546 Pain in thoracic spine: Secondary | ICD-10-CM

## 2018-03-05 DIAGNOSIS — M25552 Pain in left hip: Secondary | ICD-10-CM | POA: Diagnosis not present

## 2018-03-05 DIAGNOSIS — I6930 Unspecified sequelae of cerebral infarction: Secondary | ICD-10-CM | POA: Diagnosis not present

## 2018-03-05 DIAGNOSIS — I10 Essential (primary) hypertension: Secondary | ICD-10-CM | POA: Diagnosis not present

## 2018-03-05 DIAGNOSIS — S79912A Unspecified injury of left hip, initial encounter: Secondary | ICD-10-CM | POA: Diagnosis not present

## 2018-03-05 DIAGNOSIS — I69351 Hemiplegia and hemiparesis following cerebral infarction affecting right dominant side: Secondary | ICD-10-CM | POA: Diagnosis not present

## 2018-03-05 DIAGNOSIS — M549 Dorsalgia, unspecified: Secondary | ICD-10-CM | POA: Diagnosis not present

## 2018-03-05 NOTE — Progress Notes (Signed)
  Subjective:   Patient ID: Brooke Chavez, female    DOB: 1941-05-03, 77 y.o.   MRN: [id number] CC: Fall HPI: Brooke Chavez is a 77 y.o. female   Here today with her daughter [name].  Pt had a stroke 1 month ago.  She has been home for about 3 weeks.  She has residual right-sided weakness and aphasia.  The first week she was home she was able to walk regularly from the bedroom to the living room, using walker for stability. The next week family was only able to get her up every other day or so from bed because patient was declining to get out of bed.  This week patient has been mostly in bed. PT has been coming out to the house, though not for the last week.  Daughter heard her fall 2 days ago, when she got into the bedroom pt was sitting on her bottom on the floor with her pants pulled slightly down, next to the potty chair that is by her bed.  Daughter think she was trying to use the bathroom.  Now patient will grimace when they rub her back at times. Appetite has been fine. Pt has had some emotional lability since the stroke. She has been getting up to use bedside toilet since fall with assistance.  Relevant past medical, surgical, family and social history reviewed. Allergies and medications reviewed and updated. Social History   Tobacco Use  Smoking Status Never Smoker  Smokeless Tobacco Never Used   ROS: Per HPI   Objective:    BP 135/85   Pulse 87   Temp 98.2 F (36.8 C) (Oral)   Ht _0  (1.549 m)   LMP 11/05/1992   BMI 27.21 kg/m   Wt Readings from Last 3 Encounters:  03/02/18 144 lb (65.3 kg)  02/20/18 140 lb 1.6 oz (63.5 kg)  02/17/18 147 lb (66.7 kg)    Gen: NAD, alert, cooperative with exam, NCAT, sitting in wheelchair, non-verbal EYES: EOMI, no conjunctival injection, or no icterus CV: NRRR, normal S1/S2 Resp: CTABL, no wheezes, normal WOB Abd: +BS, soft, NTND.  Ext: no edema, warm Neuro: Alert, follows simple directions.  Sometimes will shake or nod her  head mostly appropriately to yes or no questions.  Right-sided facial droop.  MSK: grimaces with palpation mid spine, slightly decreased ROM L hip compared with R. No pain with rotation of L or R hip. Grimaces with palpation along lateral L upper leg, pelvix stable Skin: no bruising  Assessment & Plan:  Diagnoses and all orders for this visit:  Fall, initial encounter Xrays to rule out fracture. If not continuing to improve with physical therapy let us know. Any worsening or refusal to weight bear let us know. -     DG Lumbar Spine 2-3 Views; Future -     DG Thoracic Spine 2 View; Future -     Cancel: DG HIP UNILAT W OR W/O PELVIS 2-3 VIEWS LEFT; Future -     BMP8+EGFR -     CBC with Differential/Platelet   Follow up plan: 3 weeks, sooner if needed Assunta Found, MD [place]

## 2018-03-06 LAB — BMP8+EGFR
BUN/Creatinine Ratio: 11 — ABNORMAL LOW (ref 12–28)
BUN: 16 mg/dL (ref 8–27)
CO2: 21 mmol/L (ref 20–29)
Calcium: 9.9 mg/dL (ref 8.7–10.3)
Chloride: 100 mmol/L (ref 96–106)
Creatinine, Ser: 1.4 mg/dL — ABNORMAL HIGH (ref 0.57–1.00)
GFR calc Af Amer: 42 mL/min/{1.73_m2} — ABNORMAL LOW (ref >59)
GFR calc non Af Amer: 36 mL/min/{1.73_m2} — ABNORMAL LOW (ref >59)
Glucose: 102 mg/dL — ABNORMAL HIGH (ref 65–99)
Potassium: 4.3 mmol/L (ref 3.5–5.2)
Sodium: 140 mmol/L (ref 134–144)

## 2018-03-06 LAB — CBC WITH DIFFERENTIAL/PLATELET
Basophils Absolute: 0 10*3/uL (ref 0.0–0.2)
Basos: 0 %
EOS (ABSOLUTE): 0.1 10*3/uL (ref 0.0–0.4)
Eos: 1 %
Hematocrit: 41.9 % (ref 34.0–46.6)
Hemoglobin: 13.8 g/dL (ref 11.1–15.9)
Immature Grans (Abs): 0 10*3/uL (ref 0.0–0.1)
Immature Granulocytes: 0 %
Lymphocytes Absolute: 1.7 10*3/uL (ref 0.7–3.1)
Lymphs: 21 %
MCH: 30.5 pg (ref 26.6–33.0)
MCHC: 32.9 g/dL (ref 31.5–35.7)
MCV: 93 fL (ref 79–97)
Monocytes Absolute: 0.6 10*3/uL (ref 0.1–0.9)
Monocytes: 7 %
Neutrophils Absolute: 5.5 10*3/uL (ref 1.4–7.0)
Neutrophils: 71 %
Platelets: 201 10*3/uL (ref 150–450)
RBC: 4.53 x10E6/uL (ref 3.77–5.28)
RDW: 13.4 % (ref 12.3–15.4)
WBC: 7.9 10*3/uL (ref 3.4–10.8)

## 2018-03-07 ENCOUNTER — Emergency Department (HOSPITAL_COMMUNITY)
Admission: EM | Admit: 2018-03-07 | Discharge: 2018-03-07 | Disposition: A | Discharge status: Home / Self Care | Payer: Medicare Other | Attending: Emergency Medicine | Admitting: Emergency Medicine

## 2018-03-07 ENCOUNTER — Emergency Department (HOSPITAL_COMMUNITY): Payer: Medicare Other

## 2018-03-07 ENCOUNTER — Encounter (HOSPITAL_COMMUNITY): Payer: Self-pay | Admitting: Emergency Medicine

## 2018-03-07 ENCOUNTER — Other Ambulatory Visit: Payer: Self-pay

## 2018-03-07 DIAGNOSIS — Z79899 Other long term (current) drug therapy: Secondary | ICD-10-CM | POA: Diagnosis not present

## 2018-03-07 DIAGNOSIS — N183 Chronic kidney disease, stage 3 (moderate): Secondary | ICD-10-CM | POA: Diagnosis not present

## 2018-03-07 DIAGNOSIS — M5136 Other intervertebral disc degeneration, lumbar region: Secondary | ICD-10-CM | POA: Insufficient documentation

## 2018-03-07 DIAGNOSIS — R531 Weakness: Secondary | ICD-10-CM | POA: Diagnosis present

## 2018-03-07 DIAGNOSIS — I480 Paroxysmal atrial fibrillation: Secondary | ICD-10-CM | POA: Diagnosis not present

## 2018-03-07 DIAGNOSIS — I5032 Chronic diastolic (congestive) heart failure: Secondary | ICD-10-CM | POA: Diagnosis not present

## 2018-03-07 DIAGNOSIS — Z7901 Long term (current) use of anticoagulants: Secondary | ICD-10-CM | POA: Diagnosis not present

## 2018-03-07 DIAGNOSIS — R109 Unspecified abdominal pain: Secondary | ICD-10-CM | POA: Diagnosis not present

## 2018-03-07 DIAGNOSIS — M51369 Other intervertebral disc degeneration, lumbar region without mention of lumbar back pain or lower extremity pain: Secondary | ICD-10-CM

## 2018-03-07 DIAGNOSIS — Z8673 Personal history of transient ischemic attack (TIA), and cerebral infarction without residual deficits: Secondary | ICD-10-CM | POA: Diagnosis not present

## 2018-03-07 DIAGNOSIS — E785 Hyperlipidemia, unspecified: Secondary | ICD-10-CM | POA: Diagnosis not present

## 2018-03-07 DIAGNOSIS — I130 Hypertensive heart and chronic kidney disease with heart failure and stage 1 through stage 4 chronic kidney disease, or unspecified chronic kidney disease: Secondary | ICD-10-CM | POA: Insufficient documentation

## 2018-03-07 LAB — CBC
HCT: 45.4 % (ref 36.0–46.0)
Hemoglobin: 14.2 g/dL (ref 12.0–15.0)
MCH: 29.9 pg (ref 26.0–34.0)
MCHC: 31.3 g/dL (ref 30.0–36.0)
MCV: 95.6 fL (ref 78.0–100.0)
Platelets: 255 10*3/uL (ref 150–400)
RBC: 4.75 MIL/uL (ref 3.87–5.11)
RDW: 13.8 % (ref 11.5–15.5)
WBC: 7.3 10*3/uL (ref 4.0–10.5)

## 2018-03-07 LAB — BASIC METABOLIC PANEL
Anion gap: 12 (ref 5–15)
BUN: 17 mg/dL (ref 8–23)
CO2: 22 mmol/L (ref 22–32)
Calcium: 9.8 mg/dL (ref 8.9–10.3)
Chloride: 108 mmol/L (ref 98–111)
Creatinine, Ser: 1.68 mg/dL — ABNORMAL HIGH (ref 0.44–1.00)
GFR calc Af Amer: 33 mL/min — ABNORMAL LOW (ref >60)
GFR calc non Af Amer: 28 mL/min — ABNORMAL LOW (ref >60)
Glucose, Bld: 88 mg/dL (ref 70–99)
Potassium: 4.6 mmol/L (ref 3.5–5.1)
Sodium: 142 mmol/L (ref 135–145)

## 2018-03-07 LAB — URINALYSIS, ROUTINE W REFLEX MICROSCOPIC
Bilirubin Urine: NEGATIVE
Glucose, UA: NEGATIVE mg/dL
Hgb urine dipstick: NEGATIVE
Ketones, ur: 20 mg/dL — AB
Leukocytes, UA: NEGATIVE
Nitrite: NEGATIVE
Protein, ur: NEGATIVE mg/dL
Specific Gravity, Urine: 1.024 (ref 1.005–1.030)
pH: 5 (ref 5.0–8.0)

## 2018-03-07 NOTE — ED Provider Notes (Signed)
[facility] EMERGENCY DEPARTMENT Provider Note   CSN: [id number] Arrival date & time: 03/07/18  1539     History   Chief Complaint Chief Complaint  Patient presents with  . Pain    HPI Brooke Chavez is a 77 y.o. female.  HPI Patient with history of stroke and residual right-sided weakness and aphasia.  Limited history.  Level 5 caveat.  Per family members at bedside patient has been grimacing whenever she is assisted getting into bed and getting out of bed.  Had some x-rays performed on Friday by her primary physician.  Family does not know the results of those x-rays.  Patient does not yes when asked if she is having any discomfort with urination.  Per family no vomiting or diarrhea.  No new weakness. Past Medical History:  Diagnosis Date  . Atrial flutter ([place])    Typical by EKG diagnosis 9/11 s/p CIT ablation 11/11  . Bradycardia   . Chronic diastolic congestive heart failure ([place])   . DJD (degenerative joint disease)   . Dysrhythmia   . Femur fracture, left ([place]) 2013  . Heart murmur   . Hyperlipidemia    x5 years  . Hypertension    Since 1997  . Liver masses 11/13/2017   Multiple small nodules seen on CT and MRI  . Mitral regurgitation    severe  . 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 TR (tricuspid regurgitation)    Mild with RA enlargment    Patient Active Problem List   Diagnosis Date Noted  . Junctional bradycardia   . Elevated troponin 02/20/2018  . Hypomagnesemia 02/20/2018  . Atrial fibrillation ([place])   . Hypokalemia   . Acute lower UTI   . H/O mitral valve repair   . Benign essential HTN   . History of atrial fibrillation   . Stage 3 chronic kidney disease ([place])   . Hypoalbuminemia due to protein-calorie malnutrition ([place])   . Global  aphasia   . Acute blood loss anemia   . 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 Gait disturbance, post-stroke   . Dysphagia, post-stroke   . Advance care planning   . Goals of care, counseling/discussion   . Palliative care by specialist   . Acute ischemic left MCA stroke ([place]) 01/24/2018  . Stroke (cerebrum) ([employer]) 01/24/2018  . Encounter for therapeutic drug monitoring 01/18/2018  . 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 Chronic diastolic congestive heart failure ([place])   . 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 Liver masses 11/13/2017  . BMI 30.0-30.9,adult 07/24/2015  . Osteoporosis 09/19/2014  . GERD (gastroesophageal reflux disease) 07/29/2011  . PAF (paroxysmal atrial fibrillation) ([place]) 05/29/2010  . Bradycardia 01/09/2009  . Hyperlipidemia with target LDL less than 100 01/04/2009  . Essential hypertension 01/04/2009    Past Surgical History:  Procedure Laterality Date  . A FLUTTER ABLATION N/A 06/27/2011   Procedure: ABLATION A FLUTTER;  Surgeon: Thompson Grayer, MD;  [facility];  Service: Cardiovascular;  Laterality: N/A;  . ATRIAL ABLATION SURGERY  06/2010  . HIP SURGERY     Left (fracture) 3/13  . JOINT  REPLACEMENT     both knees   . KNEE ARTHROSCOPY     Right  . LUMBAR SPINE SURGERY    . MITRAL VALVE REPAIR Right 01/07/2018   Procedure: MINIMALLY INVASIVE MITRAL VALVE REPAIR using LivaNova ring size 28 MM;  Surgeon: Rexene Alberts, MD;  Location: [facility];  Service: Open Heart Surgery;  Laterality: Right;  . PATENT FORAMEN OVALE(PFO) CLOSURE N/A 01/07/2018   Procedure: PATENT FORAMEN OVALE (PFO) CLOSURE;  Surgeon: Rexene Alberts, MD;  Location: [facility];  Service: Open Heart Surgery;  Laterality: N/A;  . RIGHT/LEFT HEART CATH AND CORONARY ANGIOGRAPHY N/A 11/11/2017   Procedure:  RIGHT/LEFT HEART CATH AND CORONARY ANGIOGRAPHY;  Surgeon: Sherren Mocha, MD;  Location: [facility] CV LAB;  Service: Cardiovascular;  Laterality: N/A;  . TEE WITHOUT CARDIOVERSION N/A 09/18/2017   Procedure: TRANSESOPHAGEAL ECHOCARDIOGRAM (TEE);  Surgeon: Skeet Latch, MD;  Location: [facility];  Service: Cardiovascular;  Laterality: N/A;  . TEE WITHOUT CARDIOVERSION N/A 01/07/2018   Procedure: TRANSESOPHAGEAL ECHOCARDIOGRAM (TEE);  Surgeon: Rexene Alberts, MD;  Location: [facility];  Service: Open Heart Surgery;  Laterality: N/A;  . TOTAL KNEE ARTHROPLASTY     Left  . TUBAL LIGATION       OB History   None      Home Medications    Prior to Admission medications   Medication Sig Start Date End Date Taking? Authorizing Provider  acetaminophen (TYLENOL) 325 MG tablet Take 2 tablets (650 mg total) by mouth every 6 (six) hours as needed for mild pain (or Fever >/= 101). 02/12/18  Yes Angiulli, Lavon Paganini, PA-C  apixaban (ELIQUIS) 5 MG TABS tablet Take 1 tablet (5 mg total) by mouth 2 (two) times daily. 02/12/18  Yes Angiulli, Lavon Paganini, PA-C  atorvastatin (LIPITOR) 40 MG tablet Take 1 tablet (40 mg total) by mouth at bedtime. 02/12/18  Yes Angiulli, Lavon Paganini, PA-C  bismuth subsalicylate (PEPTO BISMOL) 262 MG chewable tablet Chew 524 mg by mouth as needed for diarrhea or loose stools.   Yes [provider]  ferrous sulfate 325 (65 FE) MG tablet Take 1 tablet (325 mg total) by mouth daily with breakfast. For one month then stop. 02/12/18 02/12/19 Yes Angiulli, Lavon Paganini, PA-C  magnesium oxide (MAG-OX) 400 MG tablet Take 1 tablet (400 mg total) by mouth daily. 02/21/18  Yes Barton Dubois, MD  spironolactone (ALDACTONE) 25 MG tablet Take 1 tablet (25 mg total) by mouth daily. 03/02/18  Yes Hassell Done, Mary-Margaret, FNP  famotidine (PEPCID) 20 MG tablet Take 1 tablet (20 mg total) by mouth 2 (two) times daily. 07/29/11 10/29/11  Liliane Shi, PA-C    Family History Family History  Problem  Relation Age of Onset  . Hypertension Mother   . Cancer Sister        leukemia  . Diabetes Brother   . Stroke Sister   . Diabetes Brother   . Heart attack Brother   . Healthy Daughter   . Healthy Son   . Healthy Daughter     Social History Social History   Tobacco Use  . Smoking status: Never Smoker  . Smokeless tobacco: Never Used  Substance Use Topics  . Alcohol use: No  . Drug use: No     Allergies   Oxycodone-acetaminophen and Aspirin   Review of Systems Review of Systems  Unable to perform ROS: Patient nonverbal     Physical Exam Updated Vital Signs BP (!) 141/87   Pulse 89   Temp  98.1 F (36.7 C) (Oral)   Resp 20   Ht 5\' 1"  (1.549 m)   Wt 65.8 kg (145 lb)   LMP 11/05/1992   SpO2 99%   BMI 27.40 kg/m   Physical Exam  Constitutional: She is oriented to person, place, and time. She appears well-developed and well-nourished. No distress.  HENT:  Head: Normocephalic and atraumatic.  Mouth/Throat: Oropharynx is clear and moist. No oropharyngeal exudate.  Eyes: Pupils are equal, round, and reactive to light. EOM are normal.  Neck: Normal range of motion. Neck supple.  Cardiovascular: Normal rate and regular rhythm. Exam reveals no gallop and no friction rub.  No murmur heard. Pulmonary/Chest: Effort normal and breath sounds normal. No stridor. No respiratory distress. She has no wheezes. She has no rales. She exhibits no tenderness.  Abdominal: Soft. Bowel sounds are normal. There is tenderness. There is no rebound and no guarding.  Mild grimacing with palpation in the umbilical and suprapubic regions.  No rebound or guarding.  Musculoskeletal: Normal range of motion. She exhibits no edema or tenderness.  No lower extremity swelling, asymmetry or tenderness.  Full range of motion of all joints including bilateral hips and knees without obvious discomfort.  No midline thoracic or lumbar discomfort with palpation.  Distal pulses intact.  Neurological: She  is alert and oriented to person, place, and time.  Aphasia.  4/5 motor right upper and right lower extremities.  5/5 motor in left upper and left lower extremities.  Skin: Skin is warm and dry. Capillary refill takes less than 2 seconds. No rash noted. She is not diaphoretic. No erythema.  Psychiatric: She has a normal mood and affect. Her behavior is normal.  Nursing note and vitals reviewed.    ED Treatments / Results  Labs (all labs ordered are listed, but only abnormal results are displayed) Labs Reviewed  BASIC METABOLIC PANEL - Abnormal; Notable for the following components:      Result Value   Creatinine, Ser 1.68 (*)    GFR calc non Af Amer 28 (*)    GFR calc Af Amer 33 (*)    All other components within normal limits  URINALYSIS, ROUTINE W REFLEX MICROSCOPIC - Abnormal; Notable for the following components:   APPearance HAZY (*)    Ketones, ur 20 (*)    All other components within normal limits  CBC    EKG None  Radiology No results found.  Procedures Procedures (including critical care time)  Medications Ordered in ED Medications - No data to display   Initial Impression / Assessment and Plan / ED Course  I have reviewed the triage vital signs and the nursing notes.  Pertinent labs & imaging results that were available during my care of the patient were reviewed by me and considered in my medical decision making (see chart for details).     Reviewed x-rays of patient's thoracic and lumbar spine.  Degenerative changes and old compression fractures noted.  No acute findings. Advised to take Tylenol as needed for pain.  Follow-up with primary physician.  Return precautions given. Final Clinical Impressions(s) / ED Diagnoses   Final diagnoses:  Degenerative disc disease, lumbar    ED Discharge Orders    None       Julianne Rice, MD 03/10/18 1359

## 2018-03-07 NOTE — ED Triage Notes (Signed)
Per family pt has been grimacing X few days when she is moving around at home. Pt denies pain when you ask her. Pt had stroke 01/24/18.

## 2018-03-07 NOTE — ED Notes (Signed)
Report given to oncoming RN.

## 2018-03-07 NOTE — ED Notes (Signed)
Patient asked for urine sample, unable to give sample at this time. 

## 2018-03-07 NOTE — ED Notes (Signed)
Pt states she feels the need to urinate - placed on bedpan with assistance of CNA

## 2018-03-07 NOTE — ED Notes (Signed)
Pt unable to provide urine specimen at this time

## 2018-03-08 ENCOUNTER — Inpatient Hospital Stay: Payer: Medicare Other | Admitting: Physical Medicine & Rehabilitation

## 2018-03-08 ENCOUNTER — Encounter: Payer: Medicare Other | Attending: Registered Nurse | Admitting: Registered Nurse

## 2018-03-08 ENCOUNTER — Encounter: Payer: Self-pay | Admitting: Registered Nurse

## 2018-03-08 VITALS — BP 158/87 | HR 85 | Ht 64.0 in | Wt 147.0 lb

## 2018-03-08 DIAGNOSIS — Z96653 Presence of artificial knee joint, bilateral: Secondary | ICD-10-CM | POA: Insufficient documentation

## 2018-03-08 DIAGNOSIS — R4701 Aphasia: Secondary | ICD-10-CM | POA: Diagnosis not present

## 2018-03-08 DIAGNOSIS — Z7901 Long term (current) use of anticoagulants: Secondary | ICD-10-CM | POA: Diagnosis not present

## 2018-03-08 DIAGNOSIS — I110 Hypertensive heart disease with heart failure: Secondary | ICD-10-CM | POA: Insufficient documentation

## 2018-03-08 DIAGNOSIS — I639 Cerebral infarction, unspecified: Secondary | ICD-10-CM | POA: Diagnosis not present

## 2018-03-08 DIAGNOSIS — Z8249 Family history of ischemic heart disease and other diseases of the circulatory system: Secondary | ICD-10-CM | POA: Insufficient documentation

## 2018-03-08 DIAGNOSIS — E785 Hyperlipidemia, unspecified: Secondary | ICD-10-CM | POA: Insufficient documentation

## 2018-03-08 DIAGNOSIS — I63512 Cerebral infarction due to unspecified occlusion or stenosis of left middle cerebral artery: Secondary | ICD-10-CM | POA: Insufficient documentation

## 2018-03-08 DIAGNOSIS — Z79899 Other long term (current) drug therapy: Secondary | ICD-10-CM | POA: Diagnosis not present

## 2018-03-08 DIAGNOSIS — I10 Essential (primary) hypertension: Secondary | ICD-10-CM

## 2018-03-08 DIAGNOSIS — I69351 Hemiplegia and hemiparesis following cerebral infarction affecting right dominant side: Secondary | ICD-10-CM | POA: Diagnosis not present

## 2018-03-08 DIAGNOSIS — I5032 Chronic diastolic (congestive) heart failure: Secondary | ICD-10-CM | POA: Diagnosis not present

## 2018-03-08 NOTE — Progress Notes (Signed)
Subjective:    Patient ID: Brooke Chavez, female    DOB: 03/28/1941, 77 y.o.   MRN: [id number]  HPI: Brooke Chavez is a 77 year old female who is here for hospital follow up on her left middle cerebral artery stroke, hemiparesis of right dominant side as late effect of cerebral infarction, combined receptive and expressive aphasia due to acute stroke, hypertension and hyperlipidemia. She denies any pain. Reports good appetite, Brooke Chavez has expressive asphasia and answer questions with nods.  Brooke Chavez  has  home health therapies from [facility].   Brooke Chavez was discharge from Rehab unit on 02/13/2018. She went to [facility]  ED on 02/20/2018 for bradycardia and discharge on 02/21/2018, note was reviewed.  On 02/22/2018 she went to [facility] Emergency Department for chest pain, note was reviewed. She was discharged. Brooke Chavez went to [facility] Emergency Department on 03/07/2018 for back pain, note was not completed at this time, per Brooke Chavez daughter she was instructed to give tylenol every 4 hours. She was instructed not to exceed 1500 mg of Tylenol in 24 hours, she verbalizes understanding.   Brooke Chavez arrived in wheelchair, daughter in room all questions answered.   Pain Inventory Average Pain 4 Pain Right Now 4 My pain is intermittent  In the last 24 hours, has pain interfered with the following? General activity 2 Relation with others 5 Enjoyment of life 8 What TIME of day is your pain at its worst? evening Sleep (in general) Good  Pain is worse with: some activites Pain improves with: medication Relief from Meds: 5  Mobility walk with assistance use a walker ability to climb steps?  no do you drive?  no use a wheelchair needs help with transfers  Function I need assistance with the following:  bathing  Neuro/Psych weakness trouble walking confusion  Prior Studies Any changes since last visit?  no  Physicians involved in your care Any changes since last  visit?  no   Family History  Problem Relation Age of Onset  . Hypertension Mother   . Cancer Sister        leukemia  . Diabetes Brother   . Stroke Sister   . Diabetes Brother   . Heart attack Brother   . Healthy Daughter   . Healthy Son   . Healthy Daughter    Social History   Socioeconomic History  . Marital status: Widowed    Spouse name: Not on file  . Number of children: Not on file  . Years of education: Not on file  . Highest education level: Not on file  Occupational History  . Not on file  Social Needs  . Financial resource strain: Not on file  . Food insecurity:    Worry: Not on file    Inability: Not on file  . Transportation needs:    Medical: Not on file    Non-medical: Not on file  Tobacco Use  . Smoking status: Never Smoker  . Smokeless tobacco: Never Used  Substance and Sexual Activity  . Alcohol use: No  . Drug use: No  . Sexual activity: Never  Lifestyle  . Physical activity:    Days per week: Not on file    Minutes per session: Not on file  . Stress: Not on file  Relationships  . Social connections:    Talks on phone: Not on file    Gets together: Not on file    Attends  religious service: Not on file    Active member of club or organization: Not on file    Attends meetings of clubs or organizations: Not on file    Relationship status: Not on file  Other Topics Concern  . Not on file  Social History Narrative   Lives in [place]   Her daughter lives with her   Past Surgical History:  Procedure Laterality Date  . A FLUTTER ABLATION N/A 06/27/2011   Procedure: ABLATION A FLUTTER;  Surgeon: Thompson Grayer, MD;  Location: [facility] CATH LAB;  Service: Cardiovascular;  Laterality: N/A;  . ATRIAL ABLATION SURGERY  06/2010  . HIP SURGERY     Left (fracture) 3/13  . JOINT REPLACEMENT     both knees   . KNEE ARTHROSCOPY     Right  . LUMBAR SPINE SURGERY    . MITRAL VALVE REPAIR Right 01/07/2018   Procedure: MINIMALLY INVASIVE MITRAL VALVE REPAIR  using LivaNova ring size 28 MM;  Surgeon: Rexene Alberts, MD;  Location: [facility];  Service: Open Heart Surgery;  Laterality: Right;  . PATENT FORAMEN OVALE(PFO) CLOSURE N/A 01/07/2018   Procedure: PATENT FORAMEN OVALE (PFO) CLOSURE;  Surgeon: Rexene Alberts, MD;  Location: [facility];  Service: Open Heart Surgery;  Laterality: N/A;  . RIGHT/LEFT HEART CATH AND CORONARY ANGIOGRAPHY N/A 11/11/2017   Procedure: RIGHT/LEFT HEART CATH AND CORONARY ANGIOGRAPHY;  Surgeon: Sherren Mocha, MD;  Location: [facility] CV LAB;  Service: Cardiovascular;  Laterality: N/A;  . TEE WITHOUT CARDIOVERSION N/A 09/18/2017   Procedure: TRANSESOPHAGEAL ECHOCARDIOGRAM (TEE);  Surgeon: Skeet Latch, MD;  Location: [facility];  Service: Cardiovascular;  Laterality: N/A;  . TEE WITHOUT CARDIOVERSION N/A 01/07/2018   Procedure: TRANSESOPHAGEAL ECHOCARDIOGRAM (TEE);  Surgeon: Rexene Alberts, MD;  Location: [facility];  Service: Open Heart Surgery;  Laterality: N/A;  . TOTAL KNEE ARTHROPLASTY     Left  . TUBAL LIGATION     Past Medical History:  Diagnosis Date  . Atrial flutter ([place])    Typical by EKG diagnosis 9/11 s/p CIT ablation 11/11  . Bradycardia   . Chronic diastolic congestive heart failure ([place])   . DJD (degenerative joint disease)   . Dysrhythmia   . Femur fracture, left ([place]) 2013  . Heart murmur   . Hyperlipidemia    x5 years  . Hypertension    Since 1997  . Liver masses 11/13/2017   Multiple small nodules seen on CT and MRI  . Mitral regurgitation    severe  . 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 TR (tricuspid regurgitation)    Mild with RA enlargment   BP (!) 160/78   Pulse (!) 54   Ht 5\' 4"  (1.626 m)   Wt 147 lb (66.7 kg)   LMP 11/05/1992   SpO2 95%   BMI 25.23 kg/m   Opioid Risk Score:     Fall Risk Score:  `1  Depression screen PHQ 2/9  Depression screen [facility] 2/9 03/05/2018 03/02/2018 11/03/2017 05/19/2017 04/28/2017 09/09/2016 02/14/2016  Decreased Interest 0 2 0 0 0 0 0  Down, Depressed, Hopeless 0 2 0 0 0 0 0  PHQ - 2 Score 0 4 0 0 0 0 0  Altered sleeping 0 3 - - - - -  Tired, decreased energy 0 3 - - - - -  Change in appetite 0 2 - - - - -  Feeling bad or failure about yourself  0 1 - - - - -  Trouble concentrating 0 1 - - - - -  Moving slowly or fidgety/restless 0 2 - - - - -  Suicidal thoughts 0 0 - - - - -  PHQ-9 Score 0 16 - - - - -     Review of Systems  Constitutional: Negative.   HENT: Negative.   Eyes: Negative.   Respiratory: Negative.   Cardiovascular: Negative.   Gastrointestinal: Positive for constipation.  Endocrine: Negative.   Genitourinary: Positive for dysuria.  Musculoskeletal: Positive for arthralgias, back pain and myalgias.  Skin: Negative.   Allergic/Immunologic: Negative.   Neurological: Negative.   Hematological: Negative.   Psychiatric/Behavioral: Negative.   All other systems reviewed and are negative.      Objective:   Physical Exam  Constitutional: She appears well-developed and well-nourished.  HENT:  Head: Normocephalic and atraumatic.  Neck: Normal range of motion. Neck supple.  Cardiovascular: Normal rate and regular rhythm.  Pulmonary/Chest: Effort normal and breath sounds normal.  Musculoskeletal:  Normal Muscle Bulk and Muscle Testing Reveals: Upper Extremities: Decreased ROM 90 Degrees Muscle Strength on the Right 3/5 and Left 4/5 Back without spinal tenderness Lower Extremities: Right: Decreased ROM and Muscle Strength 4/5 wearing Ankle Brace Left: Full ROM and Muscle Strength 5/5 Arrived in wheelchair  Neurological: She is alert.  Expressive aphasia answer questions with nods.   Nursing note and vitals reviewed.         Assessment & Plan:  1. Left Middle Cerebral Artery Stroke/ Hemiparesis of Right  Dominant Side/ Combined Receptive and expressive Aphasia: [place]. 2. HTN: Continue current medication regimen: Cardiology and PCP Following. 3. Hyperlipidemia: Continue current medication regimen.  30 minutes minutes of face to face patient care time was spent during this visit. All questions were encouraged and answered.  F/U in 4-6 weeks with Dr. Letta Pate.

## 2018-03-09 ENCOUNTER — Inpatient Hospital Stay: Payer: Medicare Other | Admitting: Registered Nurse

## 2018-03-09 DIAGNOSIS — N183 Chronic kidney disease, stage 3 (moderate): Secondary | ICD-10-CM | POA: Diagnosis not present

## 2018-03-09 DIAGNOSIS — I130 Hypertensive heart and chronic kidney disease with heart failure and stage 1 through stage 4 chronic kidney disease, or unspecified chronic kidney disease: Secondary | ICD-10-CM | POA: Diagnosis not present

## 2018-03-09 DIAGNOSIS — I480 Paroxysmal atrial fibrillation: Secondary | ICD-10-CM | POA: Diagnosis not present

## 2018-03-09 DIAGNOSIS — I69351 Hemiplegia and hemiparesis following cerebral infarction affecting right dominant side: Secondary | ICD-10-CM | POA: Diagnosis not present

## 2018-03-09 DIAGNOSIS — I5032 Chronic diastolic (congestive) heart failure: Secondary | ICD-10-CM | POA: Diagnosis not present

## 2018-03-09 DIAGNOSIS — I69320 Aphasia following cerebral infarction: Secondary | ICD-10-CM | POA: Diagnosis not present

## 2018-03-10 ENCOUNTER — Telehealth: Payer: Self-pay | Admitting: Nurse Practitioner

## 2018-03-10 NOTE — Telephone Encounter (Signed)
Yes this is fine. Force fluids.

## 2018-03-10 NOTE — Telephone Encounter (Signed)
Patients daughter aware

## 2018-03-11 DIAGNOSIS — I480 Paroxysmal atrial fibrillation: Secondary | ICD-10-CM | POA: Diagnosis not present

## 2018-03-11 DIAGNOSIS — I69351 Hemiplegia and hemiparesis following cerebral infarction affecting right dominant side: Secondary | ICD-10-CM | POA: Diagnosis not present

## 2018-03-11 DIAGNOSIS — I69320 Aphasia following cerebral infarction: Secondary | ICD-10-CM | POA: Diagnosis not present

## 2018-03-11 DIAGNOSIS — I5032 Chronic diastolic (congestive) heart failure: Secondary | ICD-10-CM | POA: Diagnosis not present

## 2018-03-11 DIAGNOSIS — N183 Chronic kidney disease, stage 3 (moderate): Secondary | ICD-10-CM | POA: Diagnosis not present

## 2018-03-11 DIAGNOSIS — I130 Hypertensive heart and chronic kidney disease with heart failure and stage 1 through stage 4 chronic kidney disease, or unspecified chronic kidney disease: Secondary | ICD-10-CM | POA: Diagnosis not present

## 2018-03-12 DIAGNOSIS — I69320 Aphasia following cerebral infarction: Secondary | ICD-10-CM | POA: Diagnosis not present

## 2018-03-12 DIAGNOSIS — I480 Paroxysmal atrial fibrillation: Secondary | ICD-10-CM | POA: Diagnosis not present

## 2018-03-12 DIAGNOSIS — N183 Chronic kidney disease, stage 3 (moderate): Secondary | ICD-10-CM | POA: Diagnosis not present

## 2018-03-12 DIAGNOSIS — I5032 Chronic diastolic (congestive) heart failure: Secondary | ICD-10-CM | POA: Diagnosis not present

## 2018-03-12 DIAGNOSIS — I69351 Hemiplegia and hemiparesis following cerebral infarction affecting right dominant side: Secondary | ICD-10-CM | POA: Diagnosis not present

## 2018-03-12 DIAGNOSIS — I130 Hypertensive heart and chronic kidney disease with heart failure and stage 1 through stage 4 chronic kidney disease, or unspecified chronic kidney disease: Secondary | ICD-10-CM | POA: Diagnosis not present

## 2018-03-13 ENCOUNTER — Encounter (HOSPITAL_COMMUNITY): Payer: Self-pay | Admitting: Emergency Medicine

## 2018-03-13 ENCOUNTER — Emergency Department (HOSPITAL_COMMUNITY)
Admission: EM | Admit: 2018-03-13 | Discharge: 2018-03-14 | Disposition: A | Discharge status: Home / Self Care | Payer: Medicare Other | Attending: Emergency Medicine | Admitting: Emergency Medicine

## 2018-03-13 ENCOUNTER — Emergency Department (HOSPITAL_COMMUNITY): Payer: Medicare Other

## 2018-03-13 DIAGNOSIS — Y929 Unspecified place or not applicable: Secondary | ICD-10-CM | POA: Diagnosis not present

## 2018-03-13 DIAGNOSIS — I130 Hypertensive heart and chronic kidney disease with heart failure and stage 1 through stage 4 chronic kidney disease, or unspecified chronic kidney disease: Secondary | ICD-10-CM | POA: Diagnosis not present

## 2018-03-13 DIAGNOSIS — M549 Dorsalgia, unspecified: Secondary | ICD-10-CM

## 2018-03-13 DIAGNOSIS — Z471 Aftercare following joint replacement surgery: Secondary | ICD-10-CM | POA: Diagnosis not present

## 2018-03-13 DIAGNOSIS — R109 Unspecified abdominal pain: Secondary | ICD-10-CM | POA: Insufficient documentation

## 2018-03-13 DIAGNOSIS — M545 Low back pain: Secondary | ICD-10-CM | POA: Diagnosis not present

## 2018-03-13 DIAGNOSIS — K769 Liver disease, unspecified: Secondary | ICD-10-CM | POA: Diagnosis not present

## 2018-03-13 DIAGNOSIS — Y939 Activity, unspecified: Secondary | ICD-10-CM | POA: Diagnosis not present

## 2018-03-13 DIAGNOSIS — Z96653 Presence of artificial knee joint, bilateral: Secondary | ICD-10-CM | POA: Diagnosis not present

## 2018-03-13 DIAGNOSIS — I69351 Hemiplegia and hemiparesis following cerebral infarction affecting right dominant side: Secondary | ICD-10-CM | POA: Insufficient documentation

## 2018-03-13 DIAGNOSIS — I5032 Chronic diastolic (congestive) heart failure: Secondary | ICD-10-CM | POA: Insufficient documentation

## 2018-03-13 DIAGNOSIS — Z7901 Long term (current) use of anticoagulants: Secondary | ICD-10-CM | POA: Insufficient documentation

## 2018-03-13 DIAGNOSIS — Y999 Unspecified external cause status: Secondary | ICD-10-CM | POA: Insufficient documentation

## 2018-03-13 DIAGNOSIS — N183 Chronic kidney disease, stage 3 (moderate): Secondary | ICD-10-CM | POA: Insufficient documentation

## 2018-03-13 DIAGNOSIS — S22089A Unspecified fracture of T11-T12 vertebra, initial encounter for closed fracture: Secondary | ICD-10-CM | POA: Diagnosis not present

## 2018-03-13 DIAGNOSIS — M25562 Pain in left knee: Secondary | ICD-10-CM | POA: Diagnosis not present

## 2018-03-13 DIAGNOSIS — Z79899 Other long term (current) drug therapy: Secondary | ICD-10-CM | POA: Diagnosis not present

## 2018-03-13 DIAGNOSIS — M79652 Pain in left thigh: Secondary | ICD-10-CM | POA: Diagnosis not present

## 2018-03-13 DIAGNOSIS — S22088A Other fracture of T11-T12 vertebra, initial encounter for closed fracture: Secondary | ICD-10-CM

## 2018-03-13 DIAGNOSIS — W19XXXA Unspecified fall, initial encounter: Secondary | ICD-10-CM | POA: Insufficient documentation

## 2018-03-13 DIAGNOSIS — I69328 Other speech and language deficits following cerebral infarction: Secondary | ICD-10-CM | POA: Insufficient documentation

## 2018-03-13 DIAGNOSIS — Z96651 Presence of right artificial knee joint: Secondary | ICD-10-CM | POA: Diagnosis not present

## 2018-03-13 DIAGNOSIS — M79651 Pain in right thigh: Secondary | ICD-10-CM | POA: Diagnosis not present

## 2018-03-13 LAB — COMPREHENSIVE METABOLIC PANEL
ALT: 14 U/L (ref 0–44)
AST: 29 U/L (ref 15–41)
Albumin: 3.5 g/dL (ref 3.5–5.0)
Alkaline Phosphatase: 74 U/L (ref 38–126)
Anion gap: 13 (ref 5–15)
BUN: 16 mg/dL (ref 8–23)
CO2: 21 mmol/L — ABNORMAL LOW (ref 22–32)
Calcium: 9.6 mg/dL (ref 8.9–10.3)
Chloride: 104 mmol/L (ref 98–111)
Creatinine, Ser: 1.58 mg/dL — ABNORMAL HIGH (ref 0.44–1.00)
GFR calc Af Amer: 35 mL/min — ABNORMAL LOW (ref >60)
GFR calc non Af Amer: 30 mL/min — ABNORMAL LOW (ref >60)
Glucose, Bld: 87 mg/dL (ref 70–99)
Potassium: 5.2 mmol/L — ABNORMAL HIGH (ref 3.5–5.1)
Sodium: 138 mmol/L (ref 135–145)
Total Bilirubin: 1.5 mg/dL — ABNORMAL HIGH (ref 0.3–1.2)
Total Protein: 6.9 g/dL (ref 6.5–8.1)

## 2018-03-13 LAB — CBC WITH DIFFERENTIAL/PLATELET
Abs Immature Granulocytes: 0 10*3/uL (ref 0.0–0.1)
Basophils Absolute: 0 10*3/uL (ref 0.0–0.1)
Basophils Relative: 0 %
Eosinophils Absolute: 0 10*3/uL (ref 0.0–0.7)
Eosinophils Relative: 0 %
HCT: 45.3 % (ref 36.0–46.0)
Hemoglobin: 14.3 g/dL (ref 12.0–15.0)
Immature Granulocytes: 0 %
Lymphocytes Relative: 14 %
Lymphs Abs: 1.3 10*3/uL (ref 0.7–4.0)
MCH: 30.4 pg (ref 26.0–34.0)
MCHC: 31.6 g/dL (ref 30.0–36.0)
MCV: 96.2 fL (ref 78.0–100.0)
Monocytes Absolute: 0.7 10*3/uL (ref 0.1–1.0)
Monocytes Relative: 7 %
Neutro Abs: 7.3 10*3/uL (ref 1.7–7.7)
Neutrophils Relative %: 79 %
Platelets: 214 10*3/uL (ref 150–400)
RBC: 4.71 MIL/uL (ref 3.87–5.11)
RDW: 14 % (ref 11.5–15.5)
WBC: 9.4 10*3/uL (ref 4.0–10.5)

## 2018-03-13 MED ORDER — IOHEXOL 300 MG/ML  SOLN
80.0000 mL | Freq: Once | INTRAMUSCULAR | Status: AC | PRN
  Administered 2018-03-13: 100 mL via INTRAVENOUS

## 2018-03-13 NOTE — ED Triage Notes (Signed)
Pt presents to ED for assessment with family.  States she has been resting most of the day, and she grimaces when getting out of bed and moving around at home.  Patient hx of stroke, and unable to answer most questions appropriately.  Patient grimaces when I palpate over her lower back and right knee.  Family states patient does not want to take Tylenol anymore due to constipation.  Pt unable to take ibuprofen, per PCP.  Patient allergic to oxycodone.

## 2018-03-13 NOTE — ED Provider Notes (Signed)
[facility] EMERGENCY DEPARTMENT Provider Note   CSN: [id number] Arrival date & time: 03/13/18  1801   History   Chief Complaint Chief Complaint  Patient presents with  . Back Pain    HPI Brooke Chavez is a 77 y.o. female with a history of atrial flutter, DJD,  hyperlipidemia, hypertension, chronic diastolic heart failure, and CVA with residual right-sided hemiparesis who presents to the emergency department with family for assessment secondary to patient having pain with getting out of bed and moving around at home.    Per family at bedside patient had a stroke with residual trouble with speech, memory, and right-sided deficits in June, she was subsequently discharged from the hospital in early July and 2 weeks later had a mechanical fall.  She was evaluated by her primary care provider and had x-rays of her thoracic and lumbar spine-no acute fracture dislocations, degenerative changes were found.  She subsequently presented to the ER with continued discomfort, x-ray information was relayed to the patient and family, Tylenol was recommended for pain.  Per family patient took Tylenol without significant change in her discomfort, however this did lead to her having some trouble with constipation.  Her last bowel movement was 4 days ago and required an enema performed by her daughter.  She has not had a bowel movement since.  She has seemed to have some discomfort in the abdomen.  No associated nausea or vomiting.  She has continued to appear uncomfortable with movements, they feel this is likely coming from her back and lower extremities.  Patient has difficulty communicating.  OT/PT does come to the house to assist patient.  Level 5 caveat secondary to patient's deficits from her stroke- somewhat non verbal.     HPI  Past Medical History:  Diagnosis Date  . Atrial flutter ([place])    Typical by EKG diagnosis 9/11 s/p CIT ablation 11/11  . Bradycardia   . Chronic diastolic  congestive heart failure ([place])   . DJD (degenerative joint disease)   . Dysrhythmia   . Femur fracture, left ([place]) 2013  . Heart murmur   . Hyperlipidemia    x5 years  . Hypertension    Since 1997  . Liver masses 11/13/2017   Multiple small nodules seen on CT and MRI  . Mitral regurgitation    severe  . 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 TR (tricuspid regurgitation)    Mild with RA enlargment    Patient Active Problem List   Diagnosis Date Noted  . Junctional bradycardia   . Elevated troponin 02/20/2018  . Hypomagnesemia 02/20/2018  . Atrial fibrillation ([place])   . Hypokalemia   . Acute lower UTI   . H/O mitral valve repair   . Benign essential HTN   . History of atrial fibrillation   . Stage 3 chronic kidney disease ([place])   . Hypoalbuminemia due to protein-calorie malnutrition ([place])   . Global aphasia   . Acute blood loss anemia   . 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 Hemiparesis of right dominant side as late effect of cerebral infarction ([place])   . 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 Gait disturbance, post-stroke   . Dysphagia, post-stroke   . Advance care planning   .  Goals of care, counseling/discussion   . Palliative care by specialist   . Acute ischemic left MCA stroke ([place]) 01/24/2018  . Stroke (cerebrum) ([place]) 01/24/2018  . Encounter for therapeutic drug monitoring 01/18/2018  . 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 Chronic diastolic congestive heart failure ([place])   . 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 Liver masses 11/13/2017  . BMI 30.0-30.9,adult 07/24/2015  . Osteoporosis 09/19/2014  . GERD (gastroesophageal reflux disease) 07/29/2011  . PAF (paroxysmal atrial fibrillation) ([place])  05/29/2010  . Bradycardia 01/09/2009  . Hyperlipidemia with target LDL less than 100 01/04/2009  . Essential hypertension 01/04/2009    Past Surgical History:  Procedure Laterality Date  . A FLUTTER ABLATION N/A 06/27/2011   Procedure: ABLATION A FLUTTER;  Surgeon: Thompson Grayer, MD;  Location: [facility] CATH LAB;  Service: Cardiovascular;  Laterality: N/A;  . ATRIAL ABLATION SURGERY  06/2010  . HIP SURGERY     Left (fracture) 3/13  . JOINT REPLACEMENT     both knees   . KNEE ARTHROSCOPY     Right  . LUMBAR SPINE SURGERY    . MITRAL VALVE REPAIR Right 01/07/2018   Procedure: MINIMALLY INVASIVE MITRAL VALVE REPAIR using LivaNova ring size 28 MM;  Surgeon: Rexene Alberts, MD;  Location: [facility];  Service: Open Heart Surgery;  Laterality: Right;  . PATENT FORAMEN OVALE(PFO) CLOSURE N/A 01/07/2018   Procedure: PATENT FORAMEN OVALE (PFO) CLOSURE;  Surgeon: Rexene Alberts, MD;  Location: [facility];  Service: Open Heart Surgery;  Laterality: N/A;  . RIGHT/LEFT HEART CATH AND CORONARY ANGIOGRAPHY N/A 11/11/2017   Procedure: RIGHT/LEFT HEART CATH AND CORONARY ANGIOGRAPHY;  Surgeon: Sherren Mocha, MD;  Location: [facility] CV LAB;  Service: Cardiovascular;  Laterality: N/A;  . TEE WITHOUT CARDIOVERSION N/A 09/18/2017   Procedure: TRANSESOPHAGEAL ECHOCARDIOGRAM (TEE);  Surgeon: Skeet Latch, MD;  Location: [facility];  Service: Cardiovascular;  Laterality: N/A;  . TEE WITHOUT CARDIOVERSION N/A 01/07/2018   Procedure: TRANSESOPHAGEAL ECHOCARDIOGRAM (TEE);  Surgeon: Rexene Alberts, MD;  Location: [facility];  Service: Open Heart Surgery;  Laterality: N/A;  . TOTAL KNEE ARTHROPLASTY     Left  . TUBAL LIGATION       OB History   None      Home Medications    Prior to Admission medications   Medication Sig Start Date End Date Taking? Authorizing Provider  acetaminophen (TYLENOL) 325 MG tablet Take 2 tablets (650 mg total) by mouth every 6 (six) hours as needed for mild pain (or Fever >/= 101).  02/12/18   Angiulli, Lavon Paganini, PA-C  apixaban (ELIQUIS) 5 MG TABS tablet Take 1 tablet (5 mg total) by mouth 2 (two) times daily. 02/12/18   Angiulli, Lavon Paganini, PA-C  atorvastatin (LIPITOR) 40 MG tablet Take 1 tablet (40 mg total) by mouth at bedtime. 02/12/18   Angiulli, Lavon Paganini, PA-C  bismuth subsalicylate (PEPTO BISMOL) 262 MG chewable tablet Chew 524 mg by mouth as needed for diarrhea or loose stools.    [provider]  ferrous sulfate 325 (65 FE) MG tablet Take 1 tablet (325 mg total) by mouth daily with breakfast. For one month then stop. 02/12/18 02/12/19  Angiulli, Lavon Paganini, PA-C  magnesium oxide (MAG-OX) 400 MG tablet Take 1 tablet (400 mg total) by mouth daily. 02/21/18   Barton Dubois, MD  spironolactone (ALDACTONE) 25 MG tablet Take 1 tablet (25 mg total) by mouth daily. 03/02/18   Hassell Done Mary-Margaret, FNP  famotidine (PEPCID) 20 MG tablet  Take 1 tablet (20 mg total) by mouth 2 (two) times daily. 07/29/11 10/29/11  Liliane Shi, PA-C    Family History Family History  Problem Relation Age of Onset  . Hypertension Mother   . Cancer Sister        leukemia  . Diabetes Brother   . Stroke Sister   . Diabetes Brother   . Heart attack Brother   . Healthy Daughter   . Healthy Son   . Healthy Daughter     Social History Social History   Tobacco Use  . Smoking status: Never Smoker  . Smokeless tobacco: Never Used  Substance Use Topics  . Alcohol use: No  . Drug use: No     Allergies   Oxycodone-acetaminophen and Aspirin   Review of Systems Review of Systems  Unable to perform ROS: Patient nonverbal    Physical Exam Updated Vital Signs BP (!) 155/98   Pulse (!) 55 Comment: RN notified.  Temp 98.8 F (37.1 C) (Oral)   Resp 19   LMP 11/05/1992   SpO2 100%   Physical Exam  Constitutional: She appears well-developed and well-nourished. No distress.  HENT:  Head: Normocephalic and atraumatic.  Eyes: Conjunctivae are normal. Right eye exhibits no discharge.  Left eye exhibits no discharge.  Neck: No spinous process tenderness present.  Cardiovascular: Normal rate and regular rhythm.  Pulses:      Radial pulses are 2+ on the right side, and 2+ on the left side.       Dorsalis pedis pulses are 2+ on the right side, and 2+ on the left side.  Pulmonary/Chest: Effort normal and breath sounds normal. No respiratory distress.  Abdominal: Soft. Normal appearance and bowel sounds are normal. She exhibits no distension. There is generalized tenderness. There is no rigidity, no rebound and no guarding.  Musculoskeletal:  No obvious deformity, appreciable swelling, erythema, ecchymosis, open wounds, or areas of increased warmth. Upper extremities nontender Back: Patient is diffusely tender in the lumbar region including midline and bilateral paraspinal muscles.  This does seem to extend into the lower thoracic spine as well.  Point/focal vertebral tenderness.  No palpable step-off.  No crepitus. Lower extremities: She is able to move her lower extremities at all joints. She  is tender to palpation to bilateral femur and knee areas, no point/focal tenderness to palpation.  No tenderness to the lower legs. No edema. Compartments are soft.    Neurological: She is alert.  Aphasia. 4/5 strength in RUE/RLE. 5/5 strength in LUE/LLE.   Psychiatric: She has a normal mood and affect. Her behavior is normal. Thought content normal.  Nursing note and vitals reviewed.    ED Treatments / Results  Labs (all labs ordered are listed, but only abnormal results are displayed) Labs Reviewed - No data to display  EKG None  Radiology Dg Knee Complete 4 Views Left  Result Date: 03/13/2018 CLINICAL DATA:  LEFT knee pain EXAM: LEFT KNEE - COMPLETE 4+ VIEW COMPARISON:  None FINDINGS: Osseous demineralization. Components of LEFT knee prosthesis identified. No acute fracture, dislocation, bone destruction, or joint effusion. No periprosthetic lucency identified. IMPRESSION: LEFT  knee prosthesis and osseous demineralization without acute abnormalities. Electronically Signed   By: Lavonia Dana M.D.   On: 03/13/2018 23:50   Dg Knee Complete 4 Views Right  Result Date: 03/13/2018 CLINICAL DATA:  Grimaces when getting out of bed and moving around, pain EXAM: RIGHT KNEE - COMPLETE 4+ VIEW COMPARISON:  None FINDINGS: Osseous demineralization. Components  of RIGHT knee prosthesis without periprosthetic lucency. No acute fracture, dislocation, or bone destruction. No knee joint effusion. IMPRESSION: RIGHT knee prosthesis without acute osseous abnormalities. Electronically Signed   By: Lavonia Dana M.D.   On: 03/13/2018 23:59   Dg Femur Min 2 Views Left  Result Date: 03/13/2018 CLINICAL DATA:  Grimaces when getting out of bed and moving around, pain EXAM: LEFT FEMUR 2 VIEWS COMPARISON:  None FINDINGS: Hardware proximal LEFT femur post ORIF of an intertrochanteric fracture. LEFT knee prosthesis. Osseous demineralization. Hip joint space preserved. No femoral fracture, dislocation, or bone destruction. Visualized LEFT pelvis intact. IMPRESSION: Prior LEFT knee arthroplasty and proximal LEFT femoral ORIF. No acute abnormalities. Electronically Signed   By: Lavonia Dana M.D.   On: 03/13/2018 23:57   Dg Femur Min 2 Views Right  Result Date: 03/13/2018 CLINICAL DATA:  Grimaces when getting out of bed and moving around, pain EXAM: RIGHT FEMUR 2 VIEWS COMPARISON:  None FINDINGS: Osseous demineralization. Visualized pelvis intact. RIGHT hip joint space preserved. RIGHT knee prosthesis. No acute fracture, dislocation, or bone destruction. IMPRESSION: No acute osseous abnormalities. Electronically Signed   By: Lavonia Dana M.D.   On: 03/13/2018 23:58    Procedures Procedures (including critical care time)  Medications Ordered in ED Medications - No data to display   Initial Impression / Assessment and Plan / ED Course  I have reviewed the triage vital signs and the nursing notes.  Pertinent  labs & imaging results that were available during my care of the patient were reviewed by me and considered in my medical decision making (see chart for details).   Patient presents to the emergency department with her family at bedside due to concern for continued discomfort with certain movements as well as some problems with constipation status post Tylenol use.  Patient's mid to lower back as well as bilateral lower extremities from knee proximally are tender to palpation.  No point/focal tenderness. No fevers, increased warmth, or redness to raise concern for etiology such as cellulitis, osteomyelitis, or septic joint in these areas.  Previous imaging reviewed including thoracic and lumbar spine x-rays, negative for acute fracture or dislocation, she has not had prior imaging of the lower extremities-we will obtain x-rays of bilateral femurs and knees. She additionally has some abdominal tenderness without peritoneal signs:  Given abdominal tenderness with constipation there is some concern for possible bowel obstruction.  Will obtain CT abdomen pelvis with contrast with no charge lumbar spine CT wo for further evaluation of each of these issues.  Patient's labs have been obtained in order to proceed with CT scan, renal function appears at baseline.  Mild hyperkalemia at 5.2.  Femur and knee x-rays are negative for fracture or dislocation.  01:00: Patient signed out to Sophia Caccavale at change of shift pending CT abdomen pelvis with contrast as well as lumbar spine CT. If imaging is without significant/emergent findings patient safe for discharge home with PCP follow up. Would trial Lidoderm patches given patient's allergies and prior difficulties with medicines attempted,  Findings and plan of care discussed with supervising physician Dr. Tomi Bamberger, in agreement  Final Clinical Impressions(s) / ED Diagnoses   Final diagnoses:  Back pain    ED Discharge Orders    None       Leafy Kindle 03/14/18 0129    Dorie Rank, MD 03/15/18 0002

## 2018-03-14 ENCOUNTER — Emergency Department (HOSPITAL_COMMUNITY): Payer: Medicare Other

## 2018-03-14 DIAGNOSIS — S22088A Other fracture of T11-T12 vertebra, initial encounter for closed fracture: Secondary | ICD-10-CM | POA: Diagnosis not present

## 2018-03-14 DIAGNOSIS — M545 Low back pain: Secondary | ICD-10-CM | POA: Diagnosis not present

## 2018-03-14 DIAGNOSIS — K769 Liver disease, unspecified: Secondary | ICD-10-CM | POA: Diagnosis not present

## 2018-03-14 MED ORDER — LIDOCAINE 5 % EX PTCH: 1.0000 | MEDICATED_PATCH | CUTANEOUS | 0 refills | Status: DC

## 2018-03-14 MED ORDER — LIDOCAINE 5 % EX PTCH
1.0000 | MEDICATED_PATCH | CUTANEOUS | Status: DC
  Administered 2018-03-14: 1 via TRANSDERMAL
  Filled 2018-03-14: qty 1

## 2018-03-14 NOTE — Discharge Instructions (Signed)
Continue taking home medications as prescribed.  Use lidocaine patches as needed for pain.  Follow up with your primary care doctor next week for further evaluation.

## 2018-03-14 NOTE — ED Provider Notes (Signed)
  Physical Exam  BP (!) 155/98   Pulse (!) 55 Comment: RN notified.  Temp 98.8 F (37.1 C) (Oral)   Resp 19   LMP 11/05/1992   SpO2 100%     MDM  Patient signed out to me by S. Petrocelli, PA-C.  Please see previous notes for further history.  In brief, patient with increased pain with movement since a fall in June.  She was evaluated after the fall with normal x-rays.  Family reports patient is grimacing when she moves.  Further history, patient had a stroke in June and has residual right-sided deficits.  Walks with a walker at baseline.  CT abdomen pelvis pending with lumbar spine.  CT shows T12 fracture without wedging or retropulsion.  No further injury since the fall in June, this is likely the source of her pain.  Patient has allergy to oxycodone, will treat with lidocaine patch.  Have patient follow-up with primary care.  Initial finding of liver/pancreatic lesions noted on CT, per chart review this was present in previous imaging.  Discussed findings with patient and family.  Discussed plan of treatment and follow-up with primary care.  At this time, patient proceed for discharge.  Return precautions given.  Patient and family state they understand and agree to plan.   Franchot Heidelberg, PA-C 03/14/18 [id number]    Veryl Speak, MD 03/15/18 [phone]

## 2018-03-16 ENCOUNTER — Telehealth: Payer: Self-pay | Admitting: Nurse Practitioner

## 2018-03-16 DIAGNOSIS — I69320 Aphasia following cerebral infarction: Secondary | ICD-10-CM | POA: Diagnosis not present

## 2018-03-16 DIAGNOSIS — N183 Chronic kidney disease, stage 3 (moderate): Secondary | ICD-10-CM | POA: Diagnosis not present

## 2018-03-16 DIAGNOSIS — I130 Hypertensive heart and chronic kidney disease with heart failure and stage 1 through stage 4 chronic kidney disease, or unspecified chronic kidney disease: Secondary | ICD-10-CM | POA: Diagnosis not present

## 2018-03-16 DIAGNOSIS — I480 Paroxysmal atrial fibrillation: Secondary | ICD-10-CM | POA: Diagnosis not present

## 2018-03-16 DIAGNOSIS — I69351 Hemiplegia and hemiparesis following cerebral infarction affecting right dominant side: Secondary | ICD-10-CM | POA: Diagnosis not present

## 2018-03-16 DIAGNOSIS — I5032 Chronic diastolic (congestive) heart failure: Secondary | ICD-10-CM | POA: Diagnosis not present

## 2018-03-17 DIAGNOSIS — N183 Chronic kidney disease, stage 3 (moderate): Secondary | ICD-10-CM | POA: Diagnosis not present

## 2018-03-17 DIAGNOSIS — I5032 Chronic diastolic (congestive) heart failure: Secondary | ICD-10-CM | POA: Diagnosis not present

## 2018-03-17 DIAGNOSIS — I130 Hypertensive heart and chronic kidney disease with heart failure and stage 1 through stage 4 chronic kidney disease, or unspecified chronic kidney disease: Secondary | ICD-10-CM | POA: Diagnosis not present

## 2018-03-17 DIAGNOSIS — I69351 Hemiplegia and hemiparesis following cerebral infarction affecting right dominant side: Secondary | ICD-10-CM | POA: Diagnosis not present

## 2018-03-17 DIAGNOSIS — I480 Paroxysmal atrial fibrillation: Secondary | ICD-10-CM | POA: Diagnosis not present

## 2018-03-17 DIAGNOSIS — I69320 Aphasia following cerebral infarction: Secondary | ICD-10-CM | POA: Diagnosis not present

## 2018-03-18 ENCOUNTER — Telehealth: Payer: Self-pay

## 2018-03-18 ENCOUNTER — Ambulatory Visit (INDEPENDENT_AMBULATORY_CARE_PROVIDER_SITE_OTHER): Payer: Medicare Other | Admitting: Adult Health

## 2018-03-18 ENCOUNTER — Encounter: Payer: Self-pay | Admitting: Adult Health

## 2018-03-18 VITALS — BP 141/79 | HR 82 | Ht 63.0 in | Wt 142.0 lb

## 2018-03-18 DIAGNOSIS — I483 Typical atrial flutter: Secondary | ICD-10-CM

## 2018-03-18 DIAGNOSIS — I63512 Cerebral infarction due to unspecified occlusion or stenosis of left middle cerebral artery: Secondary | ICD-10-CM | POA: Diagnosis not present

## 2018-03-18 DIAGNOSIS — I69351 Hemiplegia and hemiparesis following cerebral infarction affecting right dominant side: Secondary | ICD-10-CM

## 2018-03-18 DIAGNOSIS — R4701 Aphasia: Secondary | ICD-10-CM | POA: Diagnosis not present

## 2018-03-18 DIAGNOSIS — E785 Hyperlipidemia, unspecified: Secondary | ICD-10-CM

## 2018-03-18 DIAGNOSIS — I639 Cerebral infarction, unspecified: Secondary | ICD-10-CM

## 2018-03-18 DIAGNOSIS — I10 Essential (primary) hypertension: Secondary | ICD-10-CM

## 2018-03-18 NOTE — Telephone Encounter (Signed)
MMM Patient  Patient went to ER and we had to prior auth Lidocaine patch since ER doesn't do  It was denied

## 2018-03-18 NOTE — Telephone Encounter (Signed)
She can try over the counter lidocaine patches

## 2018-03-18 NOTE — Telephone Encounter (Signed)
appt scheduled Pt notified 

## 2018-03-18 NOTE — Progress Notes (Signed)
[name] [address] [place]. [place] [id number] [phone]       OFFICE FOLLOW UP NOTE  [name] Date of Birth:  Nov 07, 1940 Medical Record Number:  [id number]   Reason for Referral:  hospital stroke follow up  CHIEF COMPLAINT:  Chief Complaint  Patient presents with  . Stroke    hosptial FU, dgtrAdonis Chavez, "receiving PT, using walker at home"  . 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 History obtained from patient and chart review. Reviewed all radiology images and labs personally.  Brooke Chavez is a 77 y.o. female with history of atrial flutter, mitral regurgitation status post mitral valve repair 2 weeks ago, bradycardia, and hyperlipidemia who was found by family to have right sided weakness and aphasia the morning after going to bed and was brought into [facility] ED. She was in her normal state of health the night prior. Out of the window for tPA.  CT had reviewed and showed a large left MCA infarct and hyperdense left ICA and left MCA c/w thrombus.  Patient transferred to [facility] for possible endovascular intervention for left M1 occlusion.  CTA head and neck showed left M1 occlusion with 8 mm thrombus and left MCA infarct 37 cc.  CT perfusion showed large penumbra 78 cc. High risk for IR due to pseudo normalization on CTP and increased risk of hemorrhage. Repeat CT showed evolving left MCA infarct along with dense left MCA and old left cerebellar infarct.  MRI showed large left MCA infarct with HD, 2 mm left to right shift and old left cerebellar infarct.  2D echo showed an EF of 50 to 55% without cardiac source of embolus and also negative for PFO.  LDL 75 and recommended to continue Lipitor 40 mg at discharge.  Blood pressure stable throughout admission and recommended long-term BP goal normotensive.  A1c satisfactory at 5.3.  Patient was on warfarin prior  to admission for atrial flutter with INR 1.79.  This was held due to high risk of hemorrhage during admission.  Patient was discharged to [facility] for continued need of therapies.  Patient was discharged home on 02/13/2018 and it was decided to resume Eliquis at that time for atrial flutter management instead of Coumadin as CT scans have been stable. Patient returned to [facility] ED on 02/20/2018 for bradycardia and was discharged home in stable condition with all negative testing.  Patient returned to [name] ED on 02/22/2018 for chest pain but again all negative testing.  Patient returned to [name] ED on 03/07/2018 due to patient grimacing with any type of movement since her fall in a few days prior.  X-rays were performed by PCP a couple days prior which showed degenerative changes and old compression fractures but no acute findings and was advised to take Tylenol as needed for pain and to continue following up with PCP.  Patient return to ED on 03/13/2018 due to increased pain.  CT showed T12 fracture without wedging or retropulsion and no further injury and it was determined that this was likely the source of her pain.  Patient was treated with lidocaine patch and was discharged back home in stable condition.  Patient is being seen today for hospital follow-up and is accompanied by her daughter.  She continues to have right hemiparesis along with global aphasia.  She is currently  living with both her daughters and participating in home PT/OT/ST where she has been making some improvement.  She is currently sitting in wheelchair but is able to use rolling walker for short distances.  She has not had an additional fall since her fall back in July.  She has been this somewhat limited due to pain in her back from her fall.  Her speech has been improving some and daughter states that times she is able to say sentences where they can understand her but she has great difficulty when answering questions and at times  understanding what others are saying to her.  Her speech does get worse when she has increased fatigue, with increased anxiety or with excitement.  She continues to take Eliquis with mild bruising but no bleeding.  Continues to take Lipitor without side effects myalgias.  Blood pressure today mildly elevated at 141/79 and as this is monitored at home it is typically lower.  Daughter has found 2 small nodules on the right lower extremity and upon palpation feels as though these could be small cysts.  Patient does not complain of pain upon palpation nor they want to touch or observe redness and area.  Recommended to speak to PCP in regards to these just for continuation of monitoring or if she feels as though they are getting larger in size quickly, to be seen for this.  Denies new or worsening stroke/TIA symptoms.   ROS:   14 system review of systems performed and negative with exception of easy bruising, joint pain and runny nose  PMH:  Past Medical History:  Diagnosis Date  . Atrial flutter ([place])    Typical by EKG diagnosis 9/11 s/p CIT ablation 11/11  . Bradycardia   . Chronic diastolic congestive heart failure ([place])   . DJD (degenerative joint disease)   . Dysrhythmia   . Femur fracture, left ([place]) 2013  . Heart murmur   . Hyperlipidemia    x5 years  . Hypertension    Since 1997  . Liver masses 11/13/2017   Multiple small nodules seen on CT and MRI  . Mitral regurgitation    severe  . 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 Stroke ([place])   . TR (tricuspid regurgitation)    Mild with RA enlargment    PSH:  Past Surgical History:  Procedure Laterality Date  . A FLUTTER ABLATION N/A 06/27/2011   Procedure: ABLATION A FLUTTER;  Surgeon: Thompson Grayer, MD;  Location: [facility] CATH LAB;  Service:  Cardiovascular;  Laterality: N/A;  . ATRIAL ABLATION SURGERY  06/2010  . HIP SURGERY     Left (fracture) 3/13  . JOINT REPLACEMENT     both knees   . KNEE ARTHROSCOPY     Right  . LUMBAR SPINE SURGERY    . MITRAL VALVE REPAIR Right 01/07/2018   Procedure: MINIMALLY INVASIVE MITRAL VALVE REPAIR using LivaNova ring size 28 MM;  Surgeon: Rexene Alberts, MD;  Location: [place];  Service: Open Heart Surgery;  Laterality: Right;  . PATENT FORAMEN OVALE(PFO) CLOSURE N/A 01/07/2018   Procedure: PATENT FORAMEN OVALE (PFO) CLOSURE;  Surgeon: Rexene Alberts, MD;  Location: [place];  Service: Open Heart Surgery;  Laterality: N/A;  . RIGHT/LEFT HEART CATH AND CORONARY ANGIOGRAPHY N/A 11/11/2017   Procedure: RIGHT/LEFT HEART CATH AND  CORONARY ANGIOGRAPHY;  Surgeon: Sherren Mocha, MD;  Location: [place] CV LAB;  Service: Cardiovascular;  Laterality: N/A;  . TEE WITHOUT CARDIOVERSION N/A 09/18/2017   Procedure: TRANSESOPHAGEAL ECHOCARDIOGRAM (TEE);  Surgeon: Skeet Latch, MD;  Location: [place];  Service: Cardiovascular;  Laterality: N/A;  . TEE WITHOUT CARDIOVERSION N/A 01/07/2018   Procedure: TRANSESOPHAGEAL ECHOCARDIOGRAM (TEE);  Surgeon: Rexene Alberts, MD;  Location: [place];  Service: Open Heart Surgery;  Laterality: N/A;  . TOTAL KNEE ARTHROPLASTY     Left  . TUBAL LIGATION      Social History:  Social History   Socioeconomic History  . Marital status: Widowed    Spouse name: Not on file  . Number of children: 3  . Years of education: 41  . Highest education level: Not on file  Occupational History    Comment: na  Social Needs  . Financial resource strain: Not on file  . Food insecurity:    Worry: Not on file    Inability: Not on file  . Transportation needs:    Medical: Not on file    Non-medical: Not on file  Tobacco Use  . Smoking status: Never Smoker  . Smokeless tobacco: Never Used  Substance and Sexual Activity  . Alcohol use: No  . Drug use: No  . Sexual  activity: Never  Lifestyle  . Physical activity:    Days per week: Not on file    Minutes per session: Not on file  . Stress: Not on file  Relationships  . Social connections:    Talks on phone: Not on file    Gets together: Not on file    Attends religious service: Not on file    Active member of club or organization: Not on file    Attends meetings of clubs or organizations: Not on file    Relationship status: Not on file  . Intimate partner violence:    Fear of current or ex partner: Not on file    Emotionally abused: Not on file    Physically abused: Not on file    Forced sexual activity: Not on file  Other Topics Concern  . Not on file  Social History Narrative   Lives in [place]   Her daughter lives with her   Little caffeine    Family History:  Family History  Problem Relation Age of Onset  . Hypertension Mother   . Cancer Sister        leukemia  . Diabetes Brother   . Stroke Sister   . Diabetes Brother   . Heart attack Brother   . Healthy Daughter   . Healthy Son   . Healthy Daughter     Medications:   Current Outpatient Medications on File Prior to Visit  Medication Sig Dispense Refill  . acetaminophen (TYLENOL) 325 MG tablet Take 2 tablets (650 mg total) by mouth every 6 (six) hours as needed for mild pain (or Fever >/= 101).    Marland Kitchen apixaban (ELIQUIS) 5 MG TABS tablet Take 1 tablet (5 mg total) by mouth 2 (two) times daily. 60 tablet 1  . atorvastatin (LIPITOR) 40 MG tablet Take 1 tablet (40 mg total) by mouth at bedtime. 90 tablet 1  . bismuth subsalicylate (PEPTO BISMOL) 262 MG chewable tablet Chew 524 mg by mouth as needed for diarrhea or loose stools.    . lidocaine (LIDODERM) 5 % Place 1 patch onto the skin daily. Remove & Discard patch within 12 hours or as  directed by MD 30 patch 0  . magnesium oxide (MAG-OX) 400 MG tablet Take 1 tablet (400 mg total) by mouth daily. 30 tablet 1  . spironolactone (ALDACTONE) 25 MG tablet Take 1 tablet (25 mg total) by  mouth daily. 90 tablet 3  . ferrous sulfate 325 (65 FE) MG tablet Take 1 tablet (325 mg total) by mouth daily with breakfast. For one month then stop. (Patient not taking: Reported on 03/18/2018) 30 tablet 3  . [DISCONTINUED] famotidine (PEPCID) 20 MG tablet Take 1 tablet (20 mg total) by mouth 2 (two) times daily. 60 tablet 6   No current facility-administered medications on file prior to visit.     Allergies:   Allergies  Allergen Reactions  . Oxycodone-Acetaminophen Anxiety and Other (See Comments)    Hallucinations  . Aspirin Nausea And Vomiting     Physical Exam  Vitals:   03/18/18 1311  BP: (!) 141/79  Pulse: 82  Weight: 142 lb (64.4 kg)  Height: 5\' 3"  (1.6 m)   Body mass index is 25.15 kg/m. No exam data present  General: well developed, well nourished, seated, in no evident distress Head: head normocephalic and atraumatic.   Neck: supple with no carotid or supraclavicular bruits Cardiovascular: regular rate and rhythm, no murmurs Musculoskeletal: no deformity Skin:  no rash/petichiae Vascular:  Normal pulses all extremities  Neurologic Exam Mental Status: Awake and fully alert.  Unable to assess orientation due to moderate global aphasia.  Mood and affect appropriate.  Cranial Nerves: Fundoscopic exam reveals sharp disc margins. Pupils equal, briskly reactive to light. Extraocular movements full without nystagmus. Visual fields full to confrontation. Hearing intact. Facial sensation intact.  Mild right facial droop present Motor: Normal bulk and tone.  RUE: 4/5, RLE: Unable to accurately assess strength due to pain in lower back; LUE: 5/5, LLE: Unable to accurately assess strength due to pain in lower back Sensory.: intact to touch , pinprick , position and vibratory sensation.  Coordination: Rapid alternating movements normal in all extremities. Finger-to-nose and heel-to-shin performed accurately bilaterally. Gait and Station: Patient currently sitting in wheelchair  and as she does not have rolling walker, gait was deferred at this time Reflexes: 1+ and symmetric. Toes downgoing.    NIHSS  6 Modified Rankin  4   Diagnostic Data (Labs, Imaging, Testing)  CT head without contrast 01/24/2018 IMPRESSION: 1. Large territory acute left MCA infarct without hemorrhage. Hyperdense left internal carotid artery and left MCA compatible with acute thrombosis. 2. ASPECTS is 4  CT angio head with and without contrast CT angio neck with and without contrast CT cerebral perfusion with contrast 01/24/2018 IMPRESSION: 1. Left proximal M1 occlusion with thrombus measuring approximately 8 mm in length. Intermediate collaterals. 2. Left MCA distribution infarction with perfusion CT calculated core of 37 cc corresponding to the infarct on noncontrast CT. The calculated core may slightly underestimate the actual core as the region of infarct visible on noncontrast CT (ASPECTS 4) may have areas of pseudonormalization. 3. Large perfusion penumbra, 78 cc. 4. Patent carotid and vertebral arteries in the neck. No significant stenosis by NASCET criteria, dissection, or aneurysm. 5. Patent right MCA, bilateral ACA, and bilateral PCA distributions.  MRI brain without contrast 01/25/2018 IMPRESSION: 1. 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 2 mm LEFT-to-RIGHT midline shift. 2. Susceptibility artifact seen with old PRES, chronic hypertension, less likely amyloid angiopathy. 3. Old LEFT cerebellar infarct.  CT head without contrast (repeat) 01/31/2018 IMPRESSION: 1. Continued interval evolution of subacute left  MCA territory infarct with evidence for hemorrhagic transformation. Mildly decreased edema and regional mass effect as compared to previous. 2. No other new intracranial abnormality.     ASSESSMENT: Brooke Chavez is a 77 y.o. year old female here with left MCA infarct on 01/24/2018 secondary to left M1 occlusion likely secondary to  atrial flutter with subtherapeutic INR post MV repair 2 weeks ago. Vascular risk factors include atrial flutter, HTN and HLD.  Patient is being seen today for hospital follow-up and does have residual right hemiparesis and global aphasia.    PLAN: -Continue Eliquis (apixaban) daily  and Lipitor for secondary stroke prevention -F/u with PCP regarding your HLD and HTN management -Continue to follow follow with cardiology as scheduled for atrial flutter -Continue home PT/OT/ST and advised daughter that if additional outpatient therapy was needed to contact us for additional orders -continue to monitor BP at home -Continue to monitor right lower extremity nodules for increase in size or if become painful along with following up with PCP regarding needs for continued monitoring -Maintain strict control of hypertension with blood pressure goal below 130/90, diabetes with hemoglobin A1c goal below 6.5% and cholesterol with LDL cholesterol (bad cholesterol) goal below 70 mg/dL. I also advised the patient to eat a healthy diet with plenty of whole grains, cereals, fruits and vegetables, exercise regularly and maintain ideal body weight.  Follow up in 3 months or call earlier if needed   Greater than 50% of time during this 25 minute visit was spent on counseling,explanation of diagnosis of left MCA infarct with hemorrhagic transformation, reviewing risk factor management of atrial flutter, HTN and HLD, planning of further management, discussion with patient and family and coordination of care    Venancio Poisson, [facility]  [facility] [address] [place], [place] [address]  Phone [phone] Fax [phone]

## 2018-03-18 NOTE — Patient Instructions (Signed)
Continue Eliquis (apixaban) daily  and lipitor  for secondary stroke prevention  Continue to follow up with PCP regarding cholesterol and blood pressure management   Continue current therapies of PT/OT/ST  Continue to monitor blood pressure at home  Maintain strict control of hypertension with blood pressure goal below 130/90, diabetes with hemoglobin A1c goal below 6.5% and cholesterol with LDL cholesterol (bad cholesterol) goal below 70 mg/dL. I also advised the patient to eat a healthy diet with plenty of whole grains, cereals, fruits and vegetables, exercise regularly and maintain ideal body weight.  Followup in the future with me in 3 months or call earlier if needed       Thank you for coming to see [place] at [facility]. I hope we have been able to provide you high quality care today.  You may receive a patient satisfaction survey over the next few weeks. We would appreciate your feedback and comments so that we may continue to improve ourselves and the health of our patients.

## 2018-03-19 DIAGNOSIS — I480 Paroxysmal atrial fibrillation: Secondary | ICD-10-CM | POA: Diagnosis not present

## 2018-03-19 DIAGNOSIS — N183 Chronic kidney disease, stage 3 (moderate): Secondary | ICD-10-CM | POA: Diagnosis not present

## 2018-03-19 DIAGNOSIS — I130 Hypertensive heart and chronic kidney disease with heart failure and stage 1 through stage 4 chronic kidney disease, or unspecified chronic kidney disease: Secondary | ICD-10-CM | POA: Diagnosis not present

## 2018-03-19 DIAGNOSIS — I5032 Chronic diastolic (congestive) heart failure: Secondary | ICD-10-CM | POA: Diagnosis not present

## 2018-03-19 DIAGNOSIS — I69351 Hemiplegia and hemiparesis following cerebral infarction affecting right dominant side: Secondary | ICD-10-CM | POA: Diagnosis not present

## 2018-03-19 DIAGNOSIS — I69320 Aphasia following cerebral infarction: Secondary | ICD-10-CM | POA: Diagnosis not present

## 2018-03-19 NOTE — Telephone Encounter (Signed)
Pt's daughter aware.

## 2018-03-22 DIAGNOSIS — I5032 Chronic diastolic (congestive) heart failure: Secondary | ICD-10-CM | POA: Diagnosis not present

## 2018-03-22 DIAGNOSIS — I69351 Hemiplegia and hemiparesis following cerebral infarction affecting right dominant side: Secondary | ICD-10-CM | POA: Diagnosis not present

## 2018-03-22 DIAGNOSIS — I480 Paroxysmal atrial fibrillation: Secondary | ICD-10-CM | POA: Diagnosis not present

## 2018-03-22 DIAGNOSIS — I69320 Aphasia following cerebral infarction: Secondary | ICD-10-CM | POA: Diagnosis not present

## 2018-03-22 DIAGNOSIS — I130 Hypertensive heart and chronic kidney disease with heart failure and stage 1 through stage 4 chronic kidney disease, or unspecified chronic kidney disease: Secondary | ICD-10-CM | POA: Diagnosis not present

## 2018-03-22 DIAGNOSIS — N183 Chronic kidney disease, stage 3 (moderate): Secondary | ICD-10-CM | POA: Diagnosis not present

## 2018-03-23 ENCOUNTER — Encounter: Payer: Self-pay | Admitting: Nurse Practitioner

## 2018-03-23 ENCOUNTER — Ambulatory Visit (INDEPENDENT_AMBULATORY_CARE_PROVIDER_SITE_OTHER): Payer: Medicare Other | Admitting: Nurse Practitioner

## 2018-03-23 VITALS — BP 151/73 | HR 62 | Temp 98.9°F

## 2018-03-23 DIAGNOSIS — S22089D Unspecified fracture of T11-T12 vertebra, subsequent encounter for fracture with routine healing: Secondary | ICD-10-CM | POA: Diagnosis not present

## 2018-03-23 DIAGNOSIS — I69320 Aphasia following cerebral infarction: Secondary | ICD-10-CM | POA: Diagnosis not present

## 2018-03-23 DIAGNOSIS — N183 Chronic kidney disease, stage 3 (moderate): Secondary | ICD-10-CM | POA: Diagnosis not present

## 2018-03-23 DIAGNOSIS — I480 Paroxysmal atrial fibrillation: Secondary | ICD-10-CM | POA: Diagnosis not present

## 2018-03-23 DIAGNOSIS — Z09 Encounter for follow-up examination after completed treatment for conditions other than malignant neoplasm: Secondary | ICD-10-CM | POA: Diagnosis not present

## 2018-03-23 DIAGNOSIS — I6930 Unspecified sequelae of cerebral infarction: Secondary | ICD-10-CM | POA: Diagnosis not present

## 2018-03-23 DIAGNOSIS — I130 Hypertensive heart and chronic kidney disease with heart failure and stage 1 through stage 4 chronic kidney disease, or unspecified chronic kidney disease: Secondary | ICD-10-CM | POA: Diagnosis not present

## 2018-03-23 DIAGNOSIS — I69351 Hemiplegia and hemiparesis following cerebral infarction affecting right dominant side: Secondary | ICD-10-CM | POA: Diagnosis not present

## 2018-03-23 DIAGNOSIS — I5032 Chronic diastolic (congestive) heart failure: Secondary | ICD-10-CM | POA: Diagnosis not present

## 2018-03-23 MED ORDER — MAGNESIUM OXIDE 400 MG PO TABS: 400.0000 mg | ORAL_TABLET | Freq: Every day | ORAL | 1 refills | Status: DC

## 2018-03-23 MED ORDER — TRAMADOL HCL 50 MG PO TABS: 50.0000 mg | ORAL_TABLET | Freq: Three times a day (TID) | ORAL | 0 refills | Status: DC | PRN

## 2018-03-23 NOTE — Addendum Note (Signed)
Addended by: [name] on: 03/23/2018 03:40 PM   Modules accepted: Orders

## 2018-03-23 NOTE — Patient Instructions (Signed)
Vertebral Fracture A vertebral fracture means that one of the bones in the spine is broken (fractured). These bones are called vertebrae. You may have back pain that gets worse when you move. Vertebral fractures can be mild or severe. Many will get better without surgery. Surgery may be needed for more severe breaks. Follow these instructions at home: General instructions  Take medicines only as told by your doctor.  Do not drive or use heavy machinery while taking pain medicine.  If told, put ice on the injured area: ? Put ice in a plastic bag. ? Place a towel between your skin and the bag. ? Leave the ice on for 30 minutes every 2 hours at first. Then use the ice as needed.  Wear your neck brace or back brace as told by your doctor.  Do not drink alcohol.  Keep all follow-up visits as told by your doctor. This is important. Activity  Stay in bed (on bed rest) only as told by your doctor. Being on bed rest for too long can make your condition worse.  Return to your normal activities as told by your doctor. Ask your doctor what is safe for you to do.  Do exercises for your back (physical therapy) as told by your doctor.  Exercise often as told by your doctor. Contact a doctor if:  You have a fever.  You have a cough that makes your pain worse.  Your pain medicine is not helping.  Your pain does not get better over time.  You cannot return to your normal activities as planned. Get help right away if:  Your pain is very bad and it suddenly gets worse.  You are not able to move any body part (paralysis) that is below the level of your injury.  You have numbness, tingling, or weakness in any body part that is below the level of your injury.  You cannot control when you pee (urinate) or when you poop (have bowel movements). This information is not intended to replace advice given to you by your health care provider. Make sure you discuss any questions you have with your  health care provider. Document Released: 01/15/2010 Document Revised: 01/03/2016 Document Reviewed: 08/02/2014 Elsevier Interactive Patient Education  [employer].

## 2018-03-23 NOTE — Progress Notes (Signed)
   Subjective:    Patient ID: Brooke Chavez, female    DOB: 1941/03/04, 77 y.o.   MRN: [id number]   Chief Complaint: Hospitalization Follow-up   HPI Patient went to ER on 03/13/18 with c/o pain since she fell in June. A ct showed T12 fracture. It was felt that ws the cause of her pain. She was given lidoderm patches and family says that seems to help. Still has pain during day.    Review of Systems  Constitutional: Negative.   Respiratory: Negative.   Cardiovascular: Negative.   Genitourinary: Negative.   Musculoskeletal: Positive for back pain.  Neurological: Negative.   Psychiatric/Behavioral: Negative.   All other systems reviewed and are negative.      Objective:   Physical Exam  Constitutional: She is oriented to person, place, and time. She appears well-developed and well-nourished. No distress.  Cardiovascular: Normal rate.  Pulmonary/Chest: Effort normal.  Musculoskeletal:  patient difficult to assess- sitting in wheel chair and has very limited movement due to hx stroke.   Neurological: She is alert and oriented to person, place, and time.  Skin: Skin is warm and dry.  Psychiatric: She has a normal mood and affect. Her behavior is normal. Thought content normal.   BP (!) 151/73   Pulse 62   Temp 98.9 F (37.2 C) (Oral)   LMP 11/05/1992      Assessment & Plan:  [name] in today with chief complaint of Hospitalization Follow-up   1. Closed fracture of twelfth thoracic vertebra with routine healing, unspecified fracture morphology, subsequent encounter Moist heat to back Limited movement Sedation precautions with ultram - traMADol (ULTRAM) 50 MG tablet; Take 1 tablet (50 mg total) by mouth every 8 (eight) hours as needed.  Dispense: 30 tablet; Refill: 0  2. Hospital discharge follow-up Hospital records reviewed  [place], [place]

## 2018-03-24 DIAGNOSIS — I69351 Hemiplegia and hemiparesis following cerebral infarction affecting right dominant side: Secondary | ICD-10-CM | POA: Diagnosis not present

## 2018-03-24 DIAGNOSIS — I130 Hypertensive heart and chronic kidney disease with heart failure and stage 1 through stage 4 chronic kidney disease, or unspecified chronic kidney disease: Secondary | ICD-10-CM | POA: Diagnosis not present

## 2018-03-24 DIAGNOSIS — I480 Paroxysmal atrial fibrillation: Secondary | ICD-10-CM | POA: Diagnosis not present

## 2018-03-24 DIAGNOSIS — I69320 Aphasia following cerebral infarction: Secondary | ICD-10-CM | POA: Diagnosis not present

## 2018-03-24 DIAGNOSIS — N183 Chronic kidney disease, stage 3 (moderate): Secondary | ICD-10-CM | POA: Diagnosis not present

## 2018-03-24 DIAGNOSIS — I5032 Chronic diastolic (congestive) heart failure: Secondary | ICD-10-CM | POA: Diagnosis not present

## 2018-03-25 DIAGNOSIS — I69351 Hemiplegia and hemiparesis following cerebral infarction affecting right dominant side: Secondary | ICD-10-CM | POA: Diagnosis not present

## 2018-03-25 DIAGNOSIS — I480 Paroxysmal atrial fibrillation: Secondary | ICD-10-CM | POA: Diagnosis not present

## 2018-03-25 DIAGNOSIS — I69320 Aphasia following cerebral infarction: Secondary | ICD-10-CM | POA: Diagnosis not present

## 2018-03-25 DIAGNOSIS — N183 Chronic kidney disease, stage 3 (moderate): Secondary | ICD-10-CM | POA: Diagnosis not present

## 2018-03-25 DIAGNOSIS — I5032 Chronic diastolic (congestive) heart failure: Secondary | ICD-10-CM | POA: Diagnosis not present

## 2018-03-25 DIAGNOSIS — I130 Hypertensive heart and chronic kidney disease with heart failure and stage 1 through stage 4 chronic kidney disease, or unspecified chronic kidney disease: Secondary | ICD-10-CM | POA: Diagnosis not present

## 2018-03-25 NOTE — Progress Notes (Signed)
I agree with the above plan 

## 2018-03-26 DIAGNOSIS — I5032 Chronic diastolic (congestive) heart failure: Secondary | ICD-10-CM | POA: Diagnosis not present

## 2018-03-26 DIAGNOSIS — I69320 Aphasia following cerebral infarction: Secondary | ICD-10-CM | POA: Diagnosis not present

## 2018-03-26 DIAGNOSIS — N183 Chronic kidney disease, stage 3 (moderate): Secondary | ICD-10-CM | POA: Diagnosis not present

## 2018-03-26 DIAGNOSIS — I480 Paroxysmal atrial fibrillation: Secondary | ICD-10-CM | POA: Diagnosis not present

## 2018-03-26 DIAGNOSIS — I69351 Hemiplegia and hemiparesis following cerebral infarction affecting right dominant side: Secondary | ICD-10-CM | POA: Diagnosis not present

## 2018-03-26 DIAGNOSIS — I130 Hypertensive heart and chronic kidney disease with heart failure and stage 1 through stage 4 chronic kidney disease, or unspecified chronic kidney disease: Secondary | ICD-10-CM | POA: Diagnosis not present

## 2018-03-27 DIAGNOSIS — I69320 Aphasia following cerebral infarction: Secondary | ICD-10-CM | POA: Diagnosis not present

## 2018-03-27 DIAGNOSIS — I130 Hypertensive heart and chronic kidney disease with heart failure and stage 1 through stage 4 chronic kidney disease, or unspecified chronic kidney disease: Secondary | ICD-10-CM | POA: Diagnosis not present

## 2018-03-27 DIAGNOSIS — I69351 Hemiplegia and hemiparesis following cerebral infarction affecting right dominant side: Secondary | ICD-10-CM | POA: Diagnosis not present

## 2018-03-27 DIAGNOSIS — I5032 Chronic diastolic (congestive) heart failure: Secondary | ICD-10-CM | POA: Diagnosis not present

## 2018-03-27 DIAGNOSIS — N183 Chronic kidney disease, stage 3 (moderate): Secondary | ICD-10-CM | POA: Diagnosis not present

## 2018-03-27 DIAGNOSIS — I480 Paroxysmal atrial fibrillation: Secondary | ICD-10-CM | POA: Diagnosis not present

## 2018-03-29 DIAGNOSIS — I130 Hypertensive heart and chronic kidney disease with heart failure and stage 1 through stage 4 chronic kidney disease, or unspecified chronic kidney disease: Secondary | ICD-10-CM | POA: Diagnosis not present

## 2018-03-29 DIAGNOSIS — I5032 Chronic diastolic (congestive) heart failure: Secondary | ICD-10-CM | POA: Diagnosis not present

## 2018-03-29 DIAGNOSIS — I69320 Aphasia following cerebral infarction: Secondary | ICD-10-CM | POA: Diagnosis not present

## 2018-03-29 DIAGNOSIS — N183 Chronic kidney disease, stage 3 (moderate): Secondary | ICD-10-CM | POA: Diagnosis not present

## 2018-03-29 DIAGNOSIS — I480 Paroxysmal atrial fibrillation: Secondary | ICD-10-CM | POA: Diagnosis not present

## 2018-03-29 DIAGNOSIS — I69351 Hemiplegia and hemiparesis following cerebral infarction affecting right dominant side: Secondary | ICD-10-CM | POA: Diagnosis not present

## 2018-03-30 ENCOUNTER — Telehealth: Payer: Self-pay | Admitting: Nurse Practitioner

## 2018-03-30 NOTE — Telephone Encounter (Signed)
Stop wipes and just use warm water and wash rag- yesitis okay to use messageron back

## 2018-03-30 NOTE — Telephone Encounter (Signed)
Aware. 

## 2018-03-31 DIAGNOSIS — I5032 Chronic diastolic (congestive) heart failure: Secondary | ICD-10-CM | POA: Diagnosis not present

## 2018-03-31 DIAGNOSIS — I69320 Aphasia following cerebral infarction: Secondary | ICD-10-CM | POA: Diagnosis not present

## 2018-03-31 DIAGNOSIS — I130 Hypertensive heart and chronic kidney disease with heart failure and stage 1 through stage 4 chronic kidney disease, or unspecified chronic kidney disease: Secondary | ICD-10-CM | POA: Diagnosis not present

## 2018-03-31 DIAGNOSIS — I69351 Hemiplegia and hemiparesis following cerebral infarction affecting right dominant side: Secondary | ICD-10-CM | POA: Diagnosis not present

## 2018-03-31 DIAGNOSIS — I480 Paroxysmal atrial fibrillation: Secondary | ICD-10-CM | POA: Diagnosis not present

## 2018-03-31 DIAGNOSIS — N183 Chronic kidney disease, stage 3 (moderate): Secondary | ICD-10-CM | POA: Diagnosis not present

## 2018-04-01 DIAGNOSIS — N183 Chronic kidney disease, stage 3 (moderate): Secondary | ICD-10-CM | POA: Diagnosis not present

## 2018-04-01 DIAGNOSIS — I69320 Aphasia following cerebral infarction: Secondary | ICD-10-CM | POA: Diagnosis not present

## 2018-04-01 DIAGNOSIS — I69351 Hemiplegia and hemiparesis following cerebral infarction affecting right dominant side: Secondary | ICD-10-CM | POA: Diagnosis not present

## 2018-04-01 DIAGNOSIS — I5032 Chronic diastolic (congestive) heart failure: Secondary | ICD-10-CM | POA: Diagnosis not present

## 2018-04-01 DIAGNOSIS — I130 Hypertensive heart and chronic kidney disease with heart failure and stage 1 through stage 4 chronic kidney disease, or unspecified chronic kidney disease: Secondary | ICD-10-CM | POA: Diagnosis not present

## 2018-04-01 DIAGNOSIS — I480 Paroxysmal atrial fibrillation: Secondary | ICD-10-CM | POA: Diagnosis not present

## 2018-04-05 ENCOUNTER — Ambulatory Visit: Payer: Medicare Other | Admitting: Nurse Practitioner

## 2018-04-05 DIAGNOSIS — N183 Chronic kidney disease, stage 3 (moderate): Secondary | ICD-10-CM | POA: Diagnosis not present

## 2018-04-05 DIAGNOSIS — I130 Hypertensive heart and chronic kidney disease with heart failure and stage 1 through stage 4 chronic kidney disease, or unspecified chronic kidney disease: Secondary | ICD-10-CM | POA: Diagnosis not present

## 2018-04-05 DIAGNOSIS — I480 Paroxysmal atrial fibrillation: Secondary | ICD-10-CM | POA: Diagnosis not present

## 2018-04-05 DIAGNOSIS — I69320 Aphasia following cerebral infarction: Secondary | ICD-10-CM | POA: Diagnosis not present

## 2018-04-05 DIAGNOSIS — I69351 Hemiplegia and hemiparesis following cerebral infarction affecting right dominant side: Secondary | ICD-10-CM | POA: Diagnosis not present

## 2018-04-05 DIAGNOSIS — I5032 Chronic diastolic (congestive) heart failure: Secondary | ICD-10-CM | POA: Diagnosis not present

## 2018-04-06 ENCOUNTER — Encounter: Payer: Self-pay | Admitting: Physical Medicine & Rehabilitation

## 2018-04-06 ENCOUNTER — Encounter: Payer: Medicare Other | Attending: Registered Nurse

## 2018-04-06 ENCOUNTER — Ambulatory Visit (HOSPITAL_BASED_OUTPATIENT_CLINIC_OR_DEPARTMENT_OTHER): Payer: Medicare Other | Admitting: Physical Medicine & Rehabilitation

## 2018-04-06 VITALS — BP 164/78 | HR 51 | Resp 14

## 2018-04-06 DIAGNOSIS — R4701 Aphasia: Secondary | ICD-10-CM | POA: Diagnosis not present

## 2018-04-06 DIAGNOSIS — I63512 Cerebral infarction due to unspecified occlusion or stenosis of left middle cerebral artery: Secondary | ICD-10-CM | POA: Diagnosis not present

## 2018-04-06 DIAGNOSIS — I639 Cerebral infarction, unspecified: Secondary | ICD-10-CM

## 2018-04-06 DIAGNOSIS — E785 Hyperlipidemia, unspecified: Secondary | ICD-10-CM | POA: Diagnosis not present

## 2018-04-06 DIAGNOSIS — Z8249 Family history of ischemic heart disease and other diseases of the circulatory system: Secondary | ICD-10-CM | POA: Insufficient documentation

## 2018-04-06 DIAGNOSIS — I110 Hypertensive heart disease with heart failure: Secondary | ICD-10-CM | POA: Insufficient documentation

## 2018-04-06 DIAGNOSIS — I69351 Hemiplegia and hemiparesis following cerebral infarction affecting right dominant side: Secondary | ICD-10-CM | POA: Insufficient documentation

## 2018-04-06 DIAGNOSIS — Z79899 Other long term (current) drug therapy: Secondary | ICD-10-CM | POA: Diagnosis not present

## 2018-04-06 DIAGNOSIS — I130 Hypertensive heart and chronic kidney disease with heart failure and stage 1 through stage 4 chronic kidney disease, or unspecified chronic kidney disease: Secondary | ICD-10-CM | POA: Diagnosis not present

## 2018-04-06 DIAGNOSIS — Z7901 Long term (current) use of anticoagulants: Secondary | ICD-10-CM | POA: Insufficient documentation

## 2018-04-06 DIAGNOSIS — I69320 Aphasia following cerebral infarction: Secondary | ICD-10-CM | POA: Diagnosis not present

## 2018-04-06 DIAGNOSIS — I5032 Chronic diastolic (congestive) heart failure: Secondary | ICD-10-CM | POA: Diagnosis not present

## 2018-04-06 DIAGNOSIS — Z96653 Presence of artificial knee joint, bilateral: Secondary | ICD-10-CM | POA: Diagnosis not present

## 2018-04-06 DIAGNOSIS — N183 Chronic kidney disease, stage 3 (moderate): Secondary | ICD-10-CM | POA: Diagnosis not present

## 2018-04-06 DIAGNOSIS — I480 Paroxysmal atrial fibrillation: Secondary | ICD-10-CM | POA: Diagnosis not present

## 2018-04-06 NOTE — Progress Notes (Signed)
Subjective:    Patient ID: Brooke Chavez, female    DOB: [birth year], 77 y.o.   MRN: [id number] 77 year old right-handed female with complex medical history with mitral valve disease, atrial flutter status post mitral valve repair 01/07/2018 per Dr. Roxy Manns.  She was discharged on Coumadin 01/14/2018 as well as history of  hypertension, chronic diastolic congestive heart failure.  She presented to [facility] on 01/24/2018 after being found down by family with right-sided weakness and aphasia.  She lives with her daughter.  Reported to be independent prior to  mitral valve repair.  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  She was discharged to [facility] for ongoing care.   INR upon admission of 1.7.  CT angiogram of head and neck showed left proximal M1 occlusion with thrombus measuring 8 mm in length.  Interventional radiology consulted.  No plan for intervention due to high risk of bleeding.  Echocardiogram with  ejection fraction of 55%.  Systolic function normal.  No PFO.  MRI of the brain showed large left MCA territory infarction with petechial hemorrhage, old left cerebellar infarction.  Cardiology service is consulted.  No plan for TEE.  Neurology service  follow up with the patient had been on Coumadin therapy.  Await plan for ongoing anticoagulation.  Followup CT scan showing no signs of hemorrhagic transformation.  Physical and occupational therapy completed.  The patient was admitted for comprehensive  rehabilitation program.  HPI Has not followed up with CV[facility] Has seen PCP in office OV with cardiology 02/17/2018 Readmitted to R/o MI - had bradycardia and lethargy  Admit date: 02/20/2018 Discharge date: 02/21/2018  ED visit 7/15 for CP MI was R/oed  ED visit 7/28 for degenerative disc  8/3 ED visit for back pain CT showed age indeterminate  T12 superior endplate fx  Pt able to don shirt Needs help with LE drsg Showers in shower chair  Used BSC at first but now using toilet HHPT finishing HHOT and SLP continuing  Pt remains severely aphasic Pain Inventory Average Pain 7 Pain Right Now 7 My pain is constant  In the last 24 hours, has pain interfered with the following? General activity N/A Relation with others N/A Enjoyment of life N/A What TIME of day is your pain at its worst? varies Sleep (in general) Good  Pain is worse with: inactivity and unsure Pain improves with: medication Relief from Meds: 3  Mobility use a wheelchair  Function retired  Neuro/Psych trouble walking  Prior Studies Any changes since last visit?  no  Physicians involved in your care Any changes since last visit?  no   Family History  Problem Relation Age of Onset  . Hypertension Mother   . Cancer Sister        leukemia  . Diabetes Brother   . Stroke Sister   . Diabetes Brother   . Heart attack Brother   . Healthy Daughter   . Healthy Son   . Healthy Daughter    Social History   Socioeconomic History  . Marital status: Widowed    Spouse name: Not on file  . Number of children: 3  . Years of education: 65  . Highest education level: Not on file  Occupational History    Comment: na  Social Needs  . Financial resource strain: Not on file  . Food insecurity:    Worry: Not  on file    Inability: Not on file  . Transportation needs:    Medical: Not on file    Non-medical: Not on file  Tobacco Use  . Smoking status: Never Smoker  . Smokeless tobacco: Never Used  Substance and Sexual Activity  . Alcohol use: No  . Drug use: No  . Sexual activity: Never  Lifestyle  . Physical activity:    Days per week: Not on file    Minutes per session: Not on file  . Stress: Not on file  Relationships  . Social connections:    Talks on phone: Not on file    Gets together: Not on file    Attends religious service: Not  on file    Active member of club or organization: Not on file    Attends meetings of clubs or organizations: Not on file    Relationship status: Not on file  Other Topics Concern  . Not on file  Social History Narrative   Lives in [place]   Her daughter lives with her   Little caffeine   Past Surgical History:  Procedure Laterality Date  . A FLUTTER ABLATION N/A 06/27/2011   Procedure: ABLATION A FLUTTER;  Surgeon: Thompson Grayer, MD;  Location: [facility] CATH LAB;  Service: Cardiovascular;  Laterality: N/A;  . ATRIAL ABLATION SURGERY  06/2010  . HIP SURGERY     Left (fracture) 3/13  . JOINT REPLACEMENT     both knees   . KNEE ARTHROSCOPY     Right  . LUMBAR SPINE SURGERY    . MITRAL VALVE REPAIR Right 01/07/2018   Procedure: MINIMALLY INVASIVE MITRAL VALVE REPAIR using LivaNova ring size 28 MM;  Surgeon: Rexene Alberts, MD;  Location: [place];  Service: Open Heart Surgery;  Laterality: Right;  . PATENT FORAMEN OVALE(PFO) CLOSURE N/A 01/07/2018   Procedure: PATENT FORAMEN OVALE (PFO) CLOSURE;  Surgeon: Rexene Alberts, MD;  Location: [place];  Service: Open Heart Surgery;  Laterality: N/A;  . RIGHT/LEFT HEART CATH AND CORONARY ANGIOGRAPHY N/A 11/11/2017   Procedure: RIGHT/LEFT HEART CATH AND CORONARY ANGIOGRAPHY;  Surgeon: Sherren Mocha, MD;  Location: [place] CV LAB;  Service: Cardiovascular;  Laterality: N/A;  . TEE WITHOUT CARDIOVERSION N/A 09/18/2017   Procedure: TRANSESOPHAGEAL ECHOCARDIOGRAM (TEE);  Surgeon: Skeet Latch, MD;  Location: [place];  Service: Cardiovascular;  Laterality: N/A;  . TEE WITHOUT CARDIOVERSION N/A 01/07/2018   Procedure: TRANSESOPHAGEAL ECHOCARDIOGRAM (TEE);  Surgeon: Rexene Alberts, MD;  Location: [place];  Service: Open Heart Surgery;  Laterality: N/A;  . TOTAL KNEE ARTHROPLASTY     Left  . TUBAL LIGATION     Past Medical History:  Diagnosis Date  . Atrial flutter ([place])    Typical by EKG diagnosis 9/11 s/p CIT ablation 11/11  . Bradycardia   .  Chronic diastolic congestive heart failure ([place])   . DJD (degenerative joint disease)   . Dysrhythmia   . Femur fracture, left ([place]) 2013  . Heart murmur   . Hyperlipidemia    x5 years  . Hypertension    Since 1997  . Liver masses 11/13/2017   Multiple small nodules seen on CT and MRI  . Mitral regurgitation    severe  . 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 Stroke ([place])   . TR (tricuspid  regurgitation)    Mild with RA enlargment   BP (!) 164/78   Pulse (!) 51   Resp 14   LMP 11/05/1992   SpO2 95%   Opioid Risk Score:   Fall Risk Score:  `1  Depression screen PHQ 2/9  Depression screen [facility] 2/9 03/05/2018 03/02/2018 11/03/2017 05/19/2017 04/28/2017 09/09/2016 02/14/2016  Decreased Interest 0 2 0 0 0 0 0  Down, Depressed, Hopeless 0 2 0 0 0 0 0  PHQ - 2 Score 0 4 0 0 0 0 0  Altered sleeping 0 3 - - - - -  Tired, decreased energy 0 3 - - - - -  Change in appetite 0 2 - - - - -  Feeling bad or failure about yourself  0 1 - - - - -  Trouble concentrating 0 1 - - - - -  Moving slowly or fidgety/restless 0 2 - - - - -  Suicidal thoughts 0 0 - - - - -  PHQ-9 Score 0 16 - - - - -    Review of Systems  Constitutional: Negative.   HENT: Negative.   Eyes: Negative.   Respiratory: Negative.   Cardiovascular: Negative.   Gastrointestinal: Negative.   Endocrine: Negative.   Genitourinary: Negative.   Musculoskeletal: Negative.   Skin: Negative.   Allergic/Immunologic: Negative.   Neurological: Negative.   Hematological: Negative.   Psychiatric/Behavioral: Negative.        Objective:   Physical Exam  Constitutional: She appears well-developed and well-nourished. She appears lethargic.  HENT:  Head: Normocephalic and atraumatic.  Eyes: Pupils are equal, round, and reactive to  light. EOM are normal.  Neurological: She has normal strength. She appears lethargic. She displays no seizure activity.  Motor 4/5 in Right delt, bi, tri, grip, HF, KE, ADF 5/5 in Left delt , Bi ,tri , grip Aphasic , not consistent with Y/N responses    Nursing note and vitals reviewed. Mild diffuse tenderness based on grimace with palpation of thoracic and lumbar paraspinals        Assessment & Plan:  1.  Right sided weakness and aphasia Post Left MCA infarct, likely cardioembolic  Continues with HHOT and SLP Pt's daughter will call when [facility] is finishing up.   Will call in order for OPPT, OT, SLP  RTC 6wk  2.  Back pain with age indeterminate T12 superior endplate fx.  Pt aphasic and cannot localize pain , exam shows mild diffuse tenderness based on facial expression Advised heating pad and sports cream to back PCP also rxed Lidoderm and tramadol- would monitor for sedation

## 2018-04-06 NOTE — Patient Instructions (Addendum)
Please call when [facility] OT and Speech are finishing up I can call in therapy orders for outpt PT, OT and Speech at [place] muscle cream and heating pad on her back

## 2018-04-07 DIAGNOSIS — I130 Hypertensive heart and chronic kidney disease with heart failure and stage 1 through stage 4 chronic kidney disease, or unspecified chronic kidney disease: Secondary | ICD-10-CM | POA: Diagnosis not present

## 2018-04-07 DIAGNOSIS — N183 Chronic kidney disease, stage 3 (moderate): Secondary | ICD-10-CM | POA: Diagnosis not present

## 2018-04-07 DIAGNOSIS — I69320 Aphasia following cerebral infarction: Secondary | ICD-10-CM | POA: Diagnosis not present

## 2018-04-07 DIAGNOSIS — I69351 Hemiplegia and hemiparesis following cerebral infarction affecting right dominant side: Secondary | ICD-10-CM | POA: Diagnosis not present

## 2018-04-07 DIAGNOSIS — I480 Paroxysmal atrial fibrillation: Secondary | ICD-10-CM | POA: Diagnosis not present

## 2018-04-07 DIAGNOSIS — I5032 Chronic diastolic (congestive) heart failure: Secondary | ICD-10-CM | POA: Diagnosis not present

## 2018-04-08 DIAGNOSIS — I130 Hypertensive heart and chronic kidney disease with heart failure and stage 1 through stage 4 chronic kidney disease, or unspecified chronic kidney disease: Secondary | ICD-10-CM | POA: Diagnosis not present

## 2018-04-08 DIAGNOSIS — I480 Paroxysmal atrial fibrillation: Secondary | ICD-10-CM | POA: Diagnosis not present

## 2018-04-08 DIAGNOSIS — I69320 Aphasia following cerebral infarction: Secondary | ICD-10-CM | POA: Diagnosis not present

## 2018-04-08 DIAGNOSIS — I69351 Hemiplegia and hemiparesis following cerebral infarction affecting right dominant side: Secondary | ICD-10-CM | POA: Diagnosis not present

## 2018-04-08 DIAGNOSIS — N183 Chronic kidney disease, stage 3 (moderate): Secondary | ICD-10-CM | POA: Diagnosis not present

## 2018-04-08 DIAGNOSIS — I5032 Chronic diastolic (congestive) heart failure: Secondary | ICD-10-CM | POA: Diagnosis not present

## 2018-04-13 DIAGNOSIS — I69320 Aphasia following cerebral infarction: Secondary | ICD-10-CM | POA: Diagnosis not present

## 2018-04-13 DIAGNOSIS — I69351 Hemiplegia and hemiparesis following cerebral infarction affecting right dominant side: Secondary | ICD-10-CM | POA: Diagnosis not present

## 2018-04-13 DIAGNOSIS — N183 Chronic kidney disease, stage 3 (moderate): Secondary | ICD-10-CM | POA: Diagnosis not present

## 2018-04-13 DIAGNOSIS — I5032 Chronic diastolic (congestive) heart failure: Secondary | ICD-10-CM | POA: Diagnosis not present

## 2018-04-13 DIAGNOSIS — I130 Hypertensive heart and chronic kidney disease with heart failure and stage 1 through stage 4 chronic kidney disease, or unspecified chronic kidney disease: Secondary | ICD-10-CM | POA: Diagnosis not present

## 2018-04-13 DIAGNOSIS — I480 Paroxysmal atrial fibrillation: Secondary | ICD-10-CM | POA: Diagnosis not present

## 2018-04-14 DIAGNOSIS — I69351 Hemiplegia and hemiparesis following cerebral infarction affecting right dominant side: Secondary | ICD-10-CM | POA: Diagnosis not present

## 2018-04-14 DIAGNOSIS — N183 Chronic kidney disease, stage 3 (moderate): Secondary | ICD-10-CM | POA: Diagnosis not present

## 2018-04-14 DIAGNOSIS — I5032 Chronic diastolic (congestive) heart failure: Secondary | ICD-10-CM | POA: Diagnosis not present

## 2018-04-14 DIAGNOSIS — I480 Paroxysmal atrial fibrillation: Secondary | ICD-10-CM | POA: Diagnosis not present

## 2018-04-14 DIAGNOSIS — I69320 Aphasia following cerebral infarction: Secondary | ICD-10-CM | POA: Diagnosis not present

## 2018-04-14 DIAGNOSIS — I130 Hypertensive heart and chronic kidney disease with heart failure and stage 1 through stage 4 chronic kidney disease, or unspecified chronic kidney disease: Secondary | ICD-10-CM | POA: Diagnosis not present

## 2018-04-16 DIAGNOSIS — I69351 Hemiplegia and hemiparesis following cerebral infarction affecting right dominant side: Secondary | ICD-10-CM | POA: Diagnosis not present

## 2018-04-16 DIAGNOSIS — I69320 Aphasia following cerebral infarction: Secondary | ICD-10-CM | POA: Diagnosis not present

## 2018-04-16 DIAGNOSIS — N183 Chronic kidney disease, stage 3 (moderate): Secondary | ICD-10-CM | POA: Diagnosis not present

## 2018-04-16 DIAGNOSIS — I130 Hypertensive heart and chronic kidney disease with heart failure and stage 1 through stage 4 chronic kidney disease, or unspecified chronic kidney disease: Secondary | ICD-10-CM | POA: Diagnosis not present

## 2018-04-16 DIAGNOSIS — I5032 Chronic diastolic (congestive) heart failure: Secondary | ICD-10-CM | POA: Diagnosis not present

## 2018-04-16 DIAGNOSIS — I480 Paroxysmal atrial fibrillation: Secondary | ICD-10-CM | POA: Diagnosis not present

## 2018-04-17 DIAGNOSIS — N183 Chronic kidney disease, stage 3 (moderate): Secondary | ICD-10-CM | POA: Diagnosis not present

## 2018-04-17 DIAGNOSIS — R1310 Dysphagia, unspecified: Secondary | ICD-10-CM | POA: Diagnosis not present

## 2018-04-17 DIAGNOSIS — M810 Age-related osteoporosis without current pathological fracture: Secondary | ICD-10-CM | POA: Diagnosis not present

## 2018-04-17 DIAGNOSIS — I480 Paroxysmal atrial fibrillation: Secondary | ICD-10-CM | POA: Diagnosis not present

## 2018-04-17 DIAGNOSIS — Z9181 History of falling: Secondary | ICD-10-CM | POA: Diagnosis not present

## 2018-04-17 DIAGNOSIS — I69320 Aphasia following cerebral infarction: Secondary | ICD-10-CM | POA: Diagnosis not present

## 2018-04-17 DIAGNOSIS — Z7901 Long term (current) use of anticoagulants: Secondary | ICD-10-CM | POA: Diagnosis not present

## 2018-04-17 DIAGNOSIS — I5032 Chronic diastolic (congestive) heart failure: Secondary | ICD-10-CM | POA: Diagnosis not present

## 2018-04-17 DIAGNOSIS — I4892 Unspecified atrial flutter: Secondary | ICD-10-CM | POA: Diagnosis not present

## 2018-04-17 DIAGNOSIS — I69351 Hemiplegia and hemiparesis following cerebral infarction affecting right dominant side: Secondary | ICD-10-CM | POA: Diagnosis not present

## 2018-04-17 DIAGNOSIS — I130 Hypertensive heart and chronic kidney disease with heart failure and stage 1 through stage 4 chronic kidney disease, or unspecified chronic kidney disease: Secondary | ICD-10-CM | POA: Diagnosis not present

## 2018-04-17 DIAGNOSIS — I69391 Dysphagia following cerebral infarction: Secondary | ICD-10-CM | POA: Diagnosis not present

## 2018-04-22 DIAGNOSIS — I130 Hypertensive heart and chronic kidney disease with heart failure and stage 1 through stage 4 chronic kidney disease, or unspecified chronic kidney disease: Secondary | ICD-10-CM | POA: Diagnosis not present

## 2018-04-22 DIAGNOSIS — N183 Chronic kidney disease, stage 3 (moderate): Secondary | ICD-10-CM | POA: Diagnosis not present

## 2018-04-22 DIAGNOSIS — I5032 Chronic diastolic (congestive) heart failure: Secondary | ICD-10-CM | POA: Diagnosis not present

## 2018-04-22 DIAGNOSIS — I69351 Hemiplegia and hemiparesis following cerebral infarction affecting right dominant side: Secondary | ICD-10-CM | POA: Diagnosis not present

## 2018-04-22 DIAGNOSIS — I480 Paroxysmal atrial fibrillation: Secondary | ICD-10-CM | POA: Diagnosis not present

## 2018-04-22 DIAGNOSIS — I69320 Aphasia following cerebral infarction: Secondary | ICD-10-CM | POA: Diagnosis not present

## 2018-04-23 DIAGNOSIS — I480 Paroxysmal atrial fibrillation: Secondary | ICD-10-CM | POA: Diagnosis not present

## 2018-04-23 DIAGNOSIS — I69351 Hemiplegia and hemiparesis following cerebral infarction affecting right dominant side: Secondary | ICD-10-CM | POA: Diagnosis not present

## 2018-04-23 DIAGNOSIS — N183 Chronic kidney disease, stage 3 (moderate): Secondary | ICD-10-CM | POA: Diagnosis not present

## 2018-04-23 DIAGNOSIS — I5032 Chronic diastolic (congestive) heart failure: Secondary | ICD-10-CM | POA: Diagnosis not present

## 2018-04-23 DIAGNOSIS — I69320 Aphasia following cerebral infarction: Secondary | ICD-10-CM | POA: Diagnosis not present

## 2018-04-23 DIAGNOSIS — I130 Hypertensive heart and chronic kidney disease with heart failure and stage 1 through stage 4 chronic kidney disease, or unspecified chronic kidney disease: Secondary | ICD-10-CM | POA: Diagnosis not present

## 2018-04-26 ENCOUNTER — Ambulatory Visit (INDEPENDENT_AMBULATORY_CARE_PROVIDER_SITE_OTHER): Payer: Medicare Other | Admitting: Thoracic Surgery (Cardiothoracic Vascular Surgery)

## 2018-04-26 ENCOUNTER — Ambulatory Visit
Admission: RE | Admit: 2018-04-26 | Discharge: 2018-04-26 | Disposition: A | Discharge status: Home / Self Care | Payer: Medicare Other | Source: Ambulatory Visit | Attending: Thoracic Surgery (Cardiothoracic Vascular Surgery) | Admitting: Thoracic Surgery (Cardiothoracic Vascular Surgery)

## 2018-04-26 ENCOUNTER — Other Ambulatory Visit: Payer: Self-pay

## 2018-04-26 ENCOUNTER — Encounter: Payer: Self-pay | Admitting: Thoracic Surgery (Cardiothoracic Vascular Surgery)

## 2018-04-26 VITALS — BP 162/62 | HR 47 | Ht 63.0 in

## 2018-04-26 DIAGNOSIS — I69320 Aphasia following cerebral infarction: Secondary | ICD-10-CM | POA: Diagnosis not present

## 2018-04-26 DIAGNOSIS — I5032 Chronic diastolic (congestive) heart failure: Secondary | ICD-10-CM | POA: Diagnosis not present

## 2018-04-26 DIAGNOSIS — I130 Hypertensive heart and chronic kidney disease with heart failure and stage 1 through stage 4 chronic kidney disease, or unspecified chronic kidney disease: Secondary | ICD-10-CM | POA: Diagnosis not present

## 2018-04-26 DIAGNOSIS — I639 Cerebral infarction, unspecified: Secondary | ICD-10-CM

## 2018-04-26 DIAGNOSIS — I69351 Hemiplegia and hemiparesis following cerebral infarction affecting right dominant side: Secondary | ICD-10-CM | POA: Diagnosis not present

## 2018-04-26 DIAGNOSIS — Z9889 Other specified postprocedural states: Secondary | ICD-10-CM | POA: Diagnosis not present

## 2018-04-26 DIAGNOSIS — N183 Chronic kidney disease, stage 3 (moderate): Secondary | ICD-10-CM | POA: Diagnosis not present

## 2018-04-26 DIAGNOSIS — I480 Paroxysmal atrial fibrillation: Secondary | ICD-10-CM | POA: Diagnosis not present

## 2018-04-26 DIAGNOSIS — Z954 Presence of other heart-valve replacement: Secondary | ICD-10-CM | POA: Diagnosis not present

## 2018-04-26 NOTE — Progress Notes (Signed)
[address]       [place] [address]             [phone]     CARDIOTHORACIC SURGERY OFFICE NOTE  Referring Provider is [name], MD PCP is [name], FNP   HPI:  Patient is a 77 year old African-American female with history of mitral regurgitation, atrial flutter status post ablation, hypertension, and degenerative joint disease who returns to the office today for routine follow-up status post minimally invasive mitral valve repair and closure of patent foramen ovale on Jan 07, 2018.  The patient's early postoperative recovery in the hospital was uneventful and she was discharged home in sinus rhythm on the seventh postoperative day.  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  At the time the patient was in sinus rhythm and anticoagulated using warfarin with INR 1.8.  The patient was promptly transferred to [facility] and taken to the interventional radiology suite, but ultimately a decision was made not to proceed with intervention because of concerns regarding risk of bleeding.  The patient's subsequent convalescence has been slow but uncomplicated.  After her initial recovery from the stroke she was discharged to the inpatient rehab service.  Warfarin was stopped and Eliquis was utilized for long-term anticoagulation.  Since hospital discharge the patient has been seen in follow-up at [facility] by Kerin Ransom on February 17, 2018.  she has been followed carefully by Dr. Letta Pate in the rehab service and Venancio Poisson with neurology.  She returns her office today for routine follow-up.  She is accompanied by her daughter.  She is living at home and making slow but steady progress with her physical rehabilitation.  She is now walking short distances using a cane.  She has improved use of her right arm but this is now  associated with some discomfort of her right hand.  She still has receptive and expressive aphasia but is communicating considerably better.  She specifically denies any shortness of breath.    Current Outpatient Medications  Medication Sig Dispense Refill  . acetaminophen (TYLENOL) 325 MG tablet Take 2 tablets (650 mg total) by mouth every 6 (six) hours as needed for mild pain (or Fever >/= 101).    Marland Kitchen apixaban (ELIQUIS) 5 MG TABS tablet Take 1 tablet (5 mg total) by mouth 2 (two) times daily. 60 tablet 1  . atorvastatin (LIPITOR) 40 MG tablet Take 1 tablet (40 mg total) by mouth at bedtime. 90 tablet 1  . bismuth subsalicylate (PEPTO BISMOL) 262 MG chewable tablet Chew 524 mg by mouth as needed for diarrhea or loose stools.    . ferrous sulfate 325 (65 FE) MG tablet Take 1 tablet (325 mg total) by mouth daily with breakfast. For one month then stop. 30 tablet 3  . lidocaine (LIDODERM) 5 % Place 1 patch onto the skin daily. Remove & Discard patch within 12 hours or as directed by MD 30 patch 0  . magnesium oxide (MAG-OX) 400 MG tablet Take 1 tablet (400 mg total) by mouth daily. 30 tablet 1  . spironolactone (ALDACTONE) 25 MG tablet Take 1 tablet (25 mg total) by mouth daily. 90 tablet 3  . traMADol (ULTRAM) 50 MG tablet Take 1 tablet (50 mg total) by mouth every 8 (eight) hours as needed. 30 tablet 0   No current facility-administered medications for this visit.  Physical Exam:   BP (!) 162/62 (BP Location: Left Arm, Patient Position: Sitting, Cuff Size: Normal)   Pulse (!) 47   Ht 5\' 3"  (1.6 m)   LMP 11/05/1992   SpO2 99% Comment: RA  BMI 25.15 kg/m   General:  Well-nourished female in no distress  Chest:   Clear to auscultation  CV:   Regular rate and rhythm without murmur  Incisions:  Completely healed  Abdomen:  Soft nontender  Extremities:  Warm and well-perfused  Diagnostic Tests:  Transthoracic Echocardiography  Patient:    Brooke Chavez, Brooke Chavez MR #:        [id number] Study Date: 01/25/2018 Gender:     F Age:        49 Height:     160 cm Weight:     71.1 kg BSA:        1.8 m^2 Pt. Status: Room:       3W01C   Faylene Kurtz, Coralee Pesa  REFERRING    Rondel Jumbo  ATTENDING    Samuella Cota  ADMITTING    Lady Deutscher  PERFORMING   [facility], Inpatient  SONOGRAPHER  Madelaine Etienne  cc:  ------------------------------------------------------------------- LV EF: 50% -   55%  ------------------------------------------------------------------- History:   PMH:  Hyperlipidemia, Mitral Insufficiency, Status Post Minimally Invasive Mitral Valve Repair, Non-Rheumatic Mitral Insufficiency, Essential Hypertension, Congestive Heart Failure, Atrial Flutter. CVA  ------------------------------------------------------------------- Study Conclusions  - Left ventricle: Abnormal septal motion Systolic function was   normal. The estimated ejection fraction was in the range of 50%   to 55%. The study is not technically sufficient to allow   evaluation of LV diastolic function. - Aortic valve: Valve area (VTI): 3.31 cm^2. Valve area (Vmax):   3.77 cm^2. Valve area (Vmean): 3.33 cm^2. 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 Trivial   residual MR with somwwhat tight repair and functional MS mean   gradient 7 mmHg peak 20 mmHg. Valve area by continuity equation   (using LVOT flow): 1.63 cm^2. - Left atrium: The atrium was moderately dilated. - Right atrium: The atrium was moderately dilated. - Atrial septum: No defect or patent foramen ovale was identified. - Tricuspid valve: There was mild-moderate regurgitation. - Pulmonary arteries: PA peak pressure: 60 mm Hg (S).  ------------------------------------------------------------------- Study data:  Comparison was made to the study of 06/12/2017.  Study status:  Routine.  Procedure:  The patient reported no pain pre or post test. Transthoracic echocardiography. Image  quality was good. Study completion:  There were no complications. Transthoracic echocardiography.  M-mode, complete 2D, spectral Doppler, and color Doppler.  Birthdate:  Patient birthdate: 01-16-1941.  Age:  Patient is 77 yr old.  Sex:  Gender: female. BMI: 27.8 kg/m^2.  Blood pressure:     140/60  Patient status: Inpatient.  Study date:  Study date: 01/25/2018. Study time: 08:33 AM.  Location:  Bedside.  -------------------------------------------------------------------  ------------------------------------------------------------------- Left ventricle:  Abnormal septal motion  Wall thickness was normal.   Systolic function was normal. The estimated ejection fraction was in the range of 50% to 55%. The study is not technically sufficient to allow evaluation of LV diastolic function.  ------------------------------------------------------------------- Aortic valve:   Structurally normal valve. Trileaflet. Cusp separation was normal.  Doppler:  Transvalvular velocity was within the normal range. There was no stenosis. There was no regurgitation.    VTI ratio of LVOT to aortic valve: 0.87. Valve area (VTI): 3.31 cm^2. Indexed valve area (VTI): 1.84 cm^2/m^2.  Peak velocity ratio of LVOT to aortic valve: 0.99. Valve area (Vmax): 3.77 cm^2. Indexed valve area (Vmax): 2.1 cm^2/m^2. Mean velocity ratio of LVOT to aortic valve: 0.88. Valve area (Vmean): 3.33 cm^2. Indexed valve area (Vmean): 1.85 cm^2/m^2.    Mean gradient (S): 3 mm Hg. Peak gradient (S): 8 mm Hg.  ------------------------------------------------------------------- Aorta:  The aorta was normal, not dilated, and non-diseased.  ------------------------------------------------------------------- Mitral valve:  Post repair with annuloplasty ring. Trivial residual MR with somwwhat tight repair and functional MS mean gradient 7 mmHg peak 20 mmHg.  Doppler:     Valve area by continuity equation (using LVOT flow): 1.63  cm^2. Indexed valve area by continuity equation (using LVOT flow): 0.91 cm^2/m^2.    Mean gradient (D): 7 mm Hg. Peak gradient (D): 17 mm Hg.  ------------------------------------------------------------------- Left atrium:  The atrium was moderately dilated.  ------------------------------------------------------------------- Atrial septum:  No defect or patent foramen ovale was identified.   ------------------------------------------------------------------- Right ventricle:  The cavity size was normal. Wall thickness was normal. Systolic function was normal.  ------------------------------------------------------------------- Tricuspid valve:   Doppler:  There was mild-moderate regurgitation.   ------------------------------------------------------------------- Right atrium:  The atrium was moderately dilated.  ------------------------------------------------------------------- Pericardium:  The pericardium was normal in appearance.  ------------------------------------------------------------------- Systemic veins: Inferior vena cava: The vessel was normal in size. The respirophasic diameter changes were in the normal range (>= 50%), consistent with normal central venous pressure.  ------------------------------------------------------------------- Post procedure conclusions Ascending Aorta:  - The aorta was normal, not dilated, and non-diseased.  ------------------------------------------------------------------- Measurements   Left ventricle                           Value          Reference  LV ID, ED, PLAX chordal                  52    mm       43 - 52  LV ID, ES, PLAX chordal                  34    mm       23 - 38  LV fx shortening, PLAX chordal           35    %        >=29  LV PW thickness, ED                      13    mm       ----------  IVS/LV PW ratio, ED                      0.62           <=1.3  Stroke volume, 2D                        91    ml        ----------  Stroke volume/bsa, 2D                    51    ml/m^2   ----------  LV ejection fraction, 1-p A4C            56    %        ----------  LV end-diastolic volume, 2-p  166   ml       ----------  LV end-systolic volume, 2-p              77    ml       ----------  LV ejection fraction, 2-p                54    %        ----------  Stroke volume, 2-p                       90    ml       ----------  LV end-diastolic volume/bsa, 2-p         92    ml/m^2   ----------  LV end-systolic volume/bsa, 2-p          43    ml/m^2   ----------  Stroke volume/bsa, 2-p                   49.8  ml/m^2   ----------  LV e&', lateral                           8.33  cm/s     ----------  LV E/e&', lateral                         24.73          ----------  LV e&', medial                            4.06  cm/s     ----------  LV E/e&', medial                          50.74          ----------  LV e&', average                           6.2   cm/s     ----------  LV E/e&', average                         33.25          ----------    Ventricular septum                       Value          Reference  IVS thickness, ED                        8     mm       ----------    LVOT                                     Value          Reference  LVOT ID, S                               22    mm       ----------  LVOT area  3.8   cm^2     ----------  LVOT peak velocity, S                    138   cm/s     ----------  LVOT mean velocity, S                    74.1  cm/s     ----------  LVOT VTI, S                              23.9  cm       ----------  LVOT peak gradient, S                    8     mm Hg    ----------    Aortic valve                             Value          Reference  Aortic valve peak velocity, S            139   cm/s     ----------  Aortic valve mean velocity, S            84.5  cm/s     ----------  Aortic valve VTI, S                      27.4  cm        ----------  Aortic mean gradient, S                  3     mm Hg    ----------  Aortic peak gradient, S                  8     mm Hg    ----------  VTI ratio, LVOT/AV                       0.87           ----------  Aortic valve area, VTI                   3.31  cm^2     ----------  Aortic valve area/bsa, VTI               1.84  cm^2/m^2 ----------  Velocity ratio, peak, LVOT/AV            0.99           ----------  Aortic valve area, peak velocity         3.77  cm^2     ----------  Aortic valve area/bsa, peak              2.1   cm^2/m^2 ----------  velocity  Velocity ratio, mean, LVOT/AV            0.88           ----------  Aortic valve area, mean velocity         3.33  cm^2     ----------  Aortic valve area/bsa, mean              1.85  cm^2/m^2 ----------  velocity    Aorta  Value          Reference  Aortic root ID, ED                       33    mm       ----------  Ascending aorta ID, A-P, S               28    mm       ----------    Left atrium                              Value          Reference  LA ID, A-P, ES                           48    mm       ----------  LA ID/bsa, A-P                   (H)     2.67  cm/m^2   <=2.2  LA volume, S                             110   ml       ----------  LA volume/bsa, S                         61.2  ml/m^2   ----------  LA volume, ES, 1-p A4C                   135   ml       ----------  LA volume/bsa, ES, 1-p A4C               75.1  ml/m^2   ----------  LA volume, ES, 1-p A2C                   91.1  ml       ----------  LA volume/bsa, ES, 1-p A2C               50.7  ml/m^2   ----------    Mitral valve                             Value          Reference  Mitral E-wave peak velocity              206   cm/s     ----------  Mitral mean velocity, D                  115   cm/s     ----------  Mitral deceleration time                 180   ms       150 - 230  Mitral mean gradient, D                  7     mm  Hg    ----------  Mitral peak gradient, D                  17    mm Hg    ----------  Mitral valve area, LVOT                  1.63  cm^2     ----------  continuity  Mitral valve area/bsa, LVOT              0.91  cm^2/m^2 ----------  continuity  Mitral annulus VTI, D                    55.7  cm       ----------    Pulmonary arteries                       Value          Reference  PA pressure, S, DP               (H)     60    mm Hg    <=30    Tricuspid valve                          Value          Reference  Tricuspid regurg peak velocity           360   cm/s     ----------  Tricuspid peak RV-RA gradient            52    mm Hg    ----------    Right atrium                             Value          Reference  RA ID, S-I, ES, A4C              (H)     64.9  mm       34 - 49  RA area, ES, A4C                 (H)     29.4  cm^2     8.3 - 19.5  RA volume, ES, A/L                       105   ml       ----------  RA volume/bsa, ES, A/L                   58.4  ml/m^2   ----------    Systemic veins                           Value          Reference  Estimated CVP                            8     mm Hg    ----------    Right ventricle                          Value          Reference  RV ID, ED, PLAX                  (H)     41    mm       19 - 38  RV pressure, S, DP               (H)     60    mm Hg    <=30  Legend: (L)  and  (H)  mark values outside specified reference range.  ------------------------------------------------------------------- Prepared and Electronically Authenticated by  Jenkins Rouge, M.D. 2019-06-17T10:27:45   CHEST - 2 VIEW  COMPARISON:  02/22/2018  FINDINGS: Cardiac shadow is enlarged in size. Postsurgical changes are again noted. Aortic calcifications are seen. Mild scarring is noted in the right mid lung laterally. No focal infiltrate or effusion is seen. No acute bony abnormality is noted.  IMPRESSION: Status post mitral valve repair.  No acute  abnormality noted.   Electronically Signed   By: Inez Catalina M.D.   On: 04/26/2018 15:17     Impression:  Patient appears clinically stable now 5 months status post minimally invasive mitral valve repair complicated by acute embolic stroke in the left MCA distribution 10 days after hospital discharge.  She appears quite stable from a cardiac standpoint.  She is making slow but steady progress with regards to her physical therapy and rehab from her stroke, but she is still has been left with fairly significant residual deficit and functional impairment  Plan:  We have not recommended any change the patient's current medications.  The patient will continue to follow-up intermittently with Dr. Percival Spanish, Dr. Letta Pate, and the neurology team.  She will return to our office for follow-up next May, approximately 1 year following her surgery.  She will call and return sooner should specific problems or questions arise.   I spent in excess of 15 minutes during the conduct of this office consultation and >50% of this time involved direct face-to-face encounter with the patient for counseling and/or coordination of their care.   Valentina Gu. Roxy Manns, MD 04/26/2018 3:53 PM

## 2018-04-26 NOTE — Patient Instructions (Signed)

## 2018-04-27 DIAGNOSIS — I69351 Hemiplegia and hemiparesis following cerebral infarction affecting right dominant side: Secondary | ICD-10-CM | POA: Diagnosis not present

## 2018-04-27 DIAGNOSIS — I130 Hypertensive heart and chronic kidney disease with heart failure and stage 1 through stage 4 chronic kidney disease, or unspecified chronic kidney disease: Secondary | ICD-10-CM | POA: Diagnosis not present

## 2018-04-27 DIAGNOSIS — I5032 Chronic diastolic (congestive) heart failure: Secondary | ICD-10-CM | POA: Diagnosis not present

## 2018-04-27 DIAGNOSIS — I480 Paroxysmal atrial fibrillation: Secondary | ICD-10-CM | POA: Diagnosis not present

## 2018-04-27 DIAGNOSIS — I69320 Aphasia following cerebral infarction: Secondary | ICD-10-CM | POA: Diagnosis not present

## 2018-04-27 DIAGNOSIS — N183 Chronic kidney disease, stage 3 (moderate): Secondary | ICD-10-CM | POA: Diagnosis not present

## 2018-04-29 DIAGNOSIS — I480 Paroxysmal atrial fibrillation: Secondary | ICD-10-CM | POA: Diagnosis not present

## 2018-04-29 DIAGNOSIS — I5032 Chronic diastolic (congestive) heart failure: Secondary | ICD-10-CM | POA: Diagnosis not present

## 2018-04-29 DIAGNOSIS — I69320 Aphasia following cerebral infarction: Secondary | ICD-10-CM | POA: Diagnosis not present

## 2018-04-29 DIAGNOSIS — I130 Hypertensive heart and chronic kidney disease with heart failure and stage 1 through stage 4 chronic kidney disease, or unspecified chronic kidney disease: Secondary | ICD-10-CM | POA: Diagnosis not present

## 2018-04-29 DIAGNOSIS — I69351 Hemiplegia and hemiparesis following cerebral infarction affecting right dominant side: Secondary | ICD-10-CM | POA: Diagnosis not present

## 2018-04-29 DIAGNOSIS — N183 Chronic kidney disease, stage 3 (moderate): Secondary | ICD-10-CM | POA: Diagnosis not present

## 2018-04-30 DIAGNOSIS — I5032 Chronic diastolic (congestive) heart failure: Secondary | ICD-10-CM | POA: Diagnosis not present

## 2018-04-30 DIAGNOSIS — I480 Paroxysmal atrial fibrillation: Secondary | ICD-10-CM | POA: Diagnosis not present

## 2018-04-30 DIAGNOSIS — I69320 Aphasia following cerebral infarction: Secondary | ICD-10-CM | POA: Diagnosis not present

## 2018-04-30 DIAGNOSIS — I69351 Hemiplegia and hemiparesis following cerebral infarction affecting right dominant side: Secondary | ICD-10-CM | POA: Diagnosis not present

## 2018-04-30 DIAGNOSIS — I130 Hypertensive heart and chronic kidney disease with heart failure and stage 1 through stage 4 chronic kidney disease, or unspecified chronic kidney disease: Secondary | ICD-10-CM | POA: Diagnosis not present

## 2018-04-30 DIAGNOSIS — N183 Chronic kidney disease, stage 3 (moderate): Secondary | ICD-10-CM | POA: Diagnosis not present

## 2018-05-03 DIAGNOSIS — I69351 Hemiplegia and hemiparesis following cerebral infarction affecting right dominant side: Secondary | ICD-10-CM | POA: Diagnosis not present

## 2018-05-03 DIAGNOSIS — I480 Paroxysmal atrial fibrillation: Secondary | ICD-10-CM | POA: Diagnosis not present

## 2018-05-03 DIAGNOSIS — I130 Hypertensive heart and chronic kidney disease with heart failure and stage 1 through stage 4 chronic kidney disease, or unspecified chronic kidney disease: Secondary | ICD-10-CM | POA: Diagnosis not present

## 2018-05-03 DIAGNOSIS — I69320 Aphasia following cerebral infarction: Secondary | ICD-10-CM | POA: Diagnosis not present

## 2018-05-03 DIAGNOSIS — I5032 Chronic diastolic (congestive) heart failure: Secondary | ICD-10-CM | POA: Diagnosis not present

## 2018-05-03 DIAGNOSIS — N183 Chronic kidney disease, stage 3 (moderate): Secondary | ICD-10-CM | POA: Diagnosis not present

## 2018-05-04 DIAGNOSIS — I5032 Chronic diastolic (congestive) heart failure: Secondary | ICD-10-CM | POA: Diagnosis not present

## 2018-05-04 DIAGNOSIS — N183 Chronic kidney disease, stage 3 (moderate): Secondary | ICD-10-CM | POA: Diagnosis not present

## 2018-05-04 DIAGNOSIS — I69320 Aphasia following cerebral infarction: Secondary | ICD-10-CM | POA: Diagnosis not present

## 2018-05-04 DIAGNOSIS — I480 Paroxysmal atrial fibrillation: Secondary | ICD-10-CM | POA: Diagnosis not present

## 2018-05-04 DIAGNOSIS — I130 Hypertensive heart and chronic kidney disease with heart failure and stage 1 through stage 4 chronic kidney disease, or unspecified chronic kidney disease: Secondary | ICD-10-CM | POA: Diagnosis not present

## 2018-05-04 DIAGNOSIS — I69351 Hemiplegia and hemiparesis following cerebral infarction affecting right dominant side: Secondary | ICD-10-CM | POA: Diagnosis not present

## 2018-05-06 DIAGNOSIS — I480 Paroxysmal atrial fibrillation: Secondary | ICD-10-CM | POA: Diagnosis not present

## 2018-05-06 DIAGNOSIS — I5032 Chronic diastolic (congestive) heart failure: Secondary | ICD-10-CM | POA: Diagnosis not present

## 2018-05-06 DIAGNOSIS — I69320 Aphasia following cerebral infarction: Secondary | ICD-10-CM | POA: Diagnosis not present

## 2018-05-06 DIAGNOSIS — I69351 Hemiplegia and hemiparesis following cerebral infarction affecting right dominant side: Secondary | ICD-10-CM | POA: Diagnosis not present

## 2018-05-06 DIAGNOSIS — I130 Hypertensive heart and chronic kidney disease with heart failure and stage 1 through stage 4 chronic kidney disease, or unspecified chronic kidney disease: Secondary | ICD-10-CM | POA: Diagnosis not present

## 2018-05-06 DIAGNOSIS — N183 Chronic kidney disease, stage 3 (moderate): Secondary | ICD-10-CM | POA: Diagnosis not present

## 2018-05-10 DIAGNOSIS — I69351 Hemiplegia and hemiparesis following cerebral infarction affecting right dominant side: Secondary | ICD-10-CM | POA: Diagnosis not present

## 2018-05-10 DIAGNOSIS — I130 Hypertensive heart and chronic kidney disease with heart failure and stage 1 through stage 4 chronic kidney disease, or unspecified chronic kidney disease: Secondary | ICD-10-CM | POA: Diagnosis not present

## 2018-05-10 DIAGNOSIS — I69320 Aphasia following cerebral infarction: Secondary | ICD-10-CM | POA: Diagnosis not present

## 2018-05-10 DIAGNOSIS — N183 Chronic kidney disease, stage 3 (moderate): Secondary | ICD-10-CM | POA: Diagnosis not present

## 2018-05-10 DIAGNOSIS — I5032 Chronic diastolic (congestive) heart failure: Secondary | ICD-10-CM | POA: Diagnosis not present

## 2018-05-10 DIAGNOSIS — I480 Paroxysmal atrial fibrillation: Secondary | ICD-10-CM | POA: Diagnosis not present

## 2018-05-11 DIAGNOSIS — I69320 Aphasia following cerebral infarction: Secondary | ICD-10-CM | POA: Diagnosis not present

## 2018-05-11 DIAGNOSIS — I69351 Hemiplegia and hemiparesis following cerebral infarction affecting right dominant side: Secondary | ICD-10-CM | POA: Diagnosis not present

## 2018-05-11 DIAGNOSIS — I130 Hypertensive heart and chronic kidney disease with heart failure and stage 1 through stage 4 chronic kidney disease, or unspecified chronic kidney disease: Secondary | ICD-10-CM | POA: Diagnosis not present

## 2018-05-11 DIAGNOSIS — I480 Paroxysmal atrial fibrillation: Secondary | ICD-10-CM | POA: Diagnosis not present

## 2018-05-11 DIAGNOSIS — I5032 Chronic diastolic (congestive) heart failure: Secondary | ICD-10-CM | POA: Diagnosis not present

## 2018-05-11 DIAGNOSIS — N183 Chronic kidney disease, stage 3 (moderate): Secondary | ICD-10-CM | POA: Diagnosis not present

## 2018-05-13 DIAGNOSIS — I130 Hypertensive heart and chronic kidney disease with heart failure and stage 1 through stage 4 chronic kidney disease, or unspecified chronic kidney disease: Secondary | ICD-10-CM | POA: Diagnosis not present

## 2018-05-13 DIAGNOSIS — I5032 Chronic diastolic (congestive) heart failure: Secondary | ICD-10-CM | POA: Diagnosis not present

## 2018-05-13 DIAGNOSIS — I69320 Aphasia following cerebral infarction: Secondary | ICD-10-CM | POA: Diagnosis not present

## 2018-05-13 DIAGNOSIS — N183 Chronic kidney disease, stage 3 (moderate): Secondary | ICD-10-CM | POA: Diagnosis not present

## 2018-05-13 DIAGNOSIS — I69351 Hemiplegia and hemiparesis following cerebral infarction affecting right dominant side: Secondary | ICD-10-CM | POA: Diagnosis not present

## 2018-05-13 DIAGNOSIS — I480 Paroxysmal atrial fibrillation: Secondary | ICD-10-CM | POA: Diagnosis not present

## 2018-05-14 DIAGNOSIS — I480 Paroxysmal atrial fibrillation: Secondary | ICD-10-CM | POA: Diagnosis not present

## 2018-05-14 DIAGNOSIS — N183 Chronic kidney disease, stage 3 (moderate): Secondary | ICD-10-CM | POA: Diagnosis not present

## 2018-05-14 DIAGNOSIS — I5032 Chronic diastolic (congestive) heart failure: Secondary | ICD-10-CM | POA: Diagnosis not present

## 2018-05-14 DIAGNOSIS — I69351 Hemiplegia and hemiparesis following cerebral infarction affecting right dominant side: Secondary | ICD-10-CM | POA: Diagnosis not present

## 2018-05-14 DIAGNOSIS — I130 Hypertensive heart and chronic kidney disease with heart failure and stage 1 through stage 4 chronic kidney disease, or unspecified chronic kidney disease: Secondary | ICD-10-CM | POA: Diagnosis not present

## 2018-05-14 DIAGNOSIS — I69320 Aphasia following cerebral infarction: Secondary | ICD-10-CM | POA: Diagnosis not present

## 2018-05-17 NOTE — Progress Notes (Signed)
Brooke Chavez had been referred to [place] by Dr. [name] in April. The patient was adamant that she did not want to see an oncologist until her cardiology issues were taken care of. I reached out to her on several occasions and she told me that she would call me when she was ready. Patient has had cardiac surgery and has also suffered a stroke 3 months ago. I called and spoke with her daughter to offer an appointment with one of our oncologists. Daughter agreed. Scheduling message sent to new patient coordinator.

## 2018-05-18 ENCOUNTER — Telehealth: Payer: Self-pay | Admitting: Oncology

## 2018-05-18 ENCOUNTER — Encounter: Payer: Self-pay | Admitting: Oncology

## 2018-05-18 DIAGNOSIS — I5032 Chronic diastolic (congestive) heart failure: Secondary | ICD-10-CM | POA: Diagnosis not present

## 2018-05-18 DIAGNOSIS — I69320 Aphasia following cerebral infarction: Secondary | ICD-10-CM | POA: Diagnosis not present

## 2018-05-18 DIAGNOSIS — I480 Paroxysmal atrial fibrillation: Secondary | ICD-10-CM | POA: Diagnosis not present

## 2018-05-18 DIAGNOSIS — I130 Hypertensive heart and chronic kidney disease with heart failure and stage 1 through stage 4 chronic kidney disease, or unspecified chronic kidney disease: Secondary | ICD-10-CM | POA: Diagnosis not present

## 2018-05-18 DIAGNOSIS — N183 Chronic kidney disease, stage 3 (moderate): Secondary | ICD-10-CM | POA: Diagnosis not present

## 2018-05-18 DIAGNOSIS — I69351 Hemiplegia and hemiparesis following cerebral infarction affecting right dominant side: Secondary | ICD-10-CM | POA: Diagnosis not present

## 2018-05-18 NOTE — Telephone Encounter (Signed)
New referral received from Dr. [name] for disease of the pancreas. Pt has been scheduled to see Dr. Benay Spice on 11/4 at 2pm. Referral originally came in April, but pt had heart surgery first. Cld an provided the appt date and time to the pt's daughter, [name]. Letter mailed.

## 2018-05-20 ENCOUNTER — Encounter: Payer: Self-pay | Admitting: Cardiology

## 2018-05-20 DIAGNOSIS — I130 Hypertensive heart and chronic kidney disease with heart failure and stage 1 through stage 4 chronic kidney disease, or unspecified chronic kidney disease: Secondary | ICD-10-CM | POA: Diagnosis not present

## 2018-05-20 DIAGNOSIS — N183 Chronic kidney disease, stage 3 (moderate): Secondary | ICD-10-CM | POA: Diagnosis not present

## 2018-05-20 DIAGNOSIS — I69320 Aphasia following cerebral infarction: Secondary | ICD-10-CM | POA: Diagnosis not present

## 2018-05-20 DIAGNOSIS — I480 Paroxysmal atrial fibrillation: Secondary | ICD-10-CM | POA: Diagnosis not present

## 2018-05-20 DIAGNOSIS — I5032 Chronic diastolic (congestive) heart failure: Secondary | ICD-10-CM | POA: Diagnosis not present

## 2018-05-20 DIAGNOSIS — I69351 Hemiplegia and hemiparesis following cerebral infarction affecting right dominant side: Secondary | ICD-10-CM | POA: Diagnosis not present

## 2018-05-20 NOTE — Progress Notes (Signed)
HPI The patient presents for follow up of atrial flutter.   She is s/p ablation and redo ablation.   Post procedure she had some paroxysms of probable fibrillation though these were a very brief period.  She has had lots of atrial ectopy and runs of atrial tachycardia. However, she had no sustained tachyarrhythmias. She had some bradycardic episodes but no sustained bradycardia arrhythmias. There were some junctional beats.  We have managed her medically for this.  She had an echo late last year.  This demonstrated that the MR had increases to moderately severe.  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  The ejection fraction was also reduced 45-50%. I sent her for a TEE.  She had severe MR.  On 01/01/18 she underwent minimally invasive MV repair, MAZE, and closure of PFO. She had been on Eliquis pre op, Coumadin was started post op. She was discharged 01/14/18. On 01/24/18 she presented to an outside hospital with a left brain stroke. Her INR on presentation was 1.7.  She returns for follow-up.  She is finished with home physical therapy.  She is home and has family there all the time.  She walks with her walker.  Her biggest issue is expressive a aphasia.  Her family has not noticed any distress.  There might be some discomfort when she tries to straighten her weak right leg.  However, there is no other discomfort noted and there is no acute shortness of breath, PND or orthopnea noted.  She has not had any swelling.   Allergies  Allergen Reactions  . Oxycodone-Acetaminophen Anxiety and Other (See Comments)    Hallucinations  . Aspirin Nausea And Vomiting and Nausea Only    Other reaction(s): GI Upset (intolerance)    Current Outpatient Medications  Medication Sig Dispense Refill  . acetaminophen (TYLENOL) 325 MG tablet Take 2 tablets (650 mg total) by mouth every 6 (six) hours as needed for mild pain (or Fever >/= 101).    Marland Kitchen apixaban (ELIQUIS) 5 MG TABS tablet Take 1 tablet (5 mg total)  by mouth 2 (two) times daily. 60 tablet 1  . atorvastatin (LIPITOR) 40 MG tablet Take 1 tablet (40 mg total) by mouth at bedtime. 90 tablet 1  . magnesium oxide (MAG-OX) 400 MG tablet Take 1 tablet (400 mg total) by mouth daily. 30 tablet 1  . spironolactone (ALDACTONE) 25 MG tablet Take 1 tablet (25 mg total) by mouth daily. 90 tablet 3   No current facility-administered medications for this visit.     Past Medical History:  Diagnosis Date  . Atrial flutter ([place])    Typical by EKG diagnosis 9/11 s/p CIT ablation 11/11  . Bradycardia   . Chronic diastolic congestive heart failure ([place])   . DJD (degenerative joint disease)   . Dysrhythmia   . Femur fracture, left ([place]) 2013  . Hyperlipidemia    x5 years  . Hypertension    Since 1997  . Liver masses 11/13/2017   Multiple small nodules seen on CT and MRI  . Mitral regurgitation    severe  . 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 Stroke ([place])   . 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 A FLUTTER  ABLATION N/A 06/27/2011   Procedure: ABLATION A FLUTTER;  Surgeon: Thompson Grayer, MD;  Location: [facility] CATH LAB;  Service: Cardiovascular;  Laterality: N/A;  . ATRIAL ABLATION SURGERY  06/2010  . HIP SURGERY     Left (fracture) 3/13  . JOINT REPLACEMENT     both knees   . KNEE ARTHROSCOPY     Right  . LUMBAR SPINE SURGERY    . MITRAL VALVE REPAIR Right 01/07/2018   Procedure: MINIMALLY INVASIVE MITRAL VALVE REPAIR using LivaNova ring size 28 MM;  Surgeon: Rexene Alberts, MD;  Location: [place];  Service: Open Heart Surgery;  Laterality: Right;  . PATENT FORAMEN OVALE(PFO) CLOSURE N/A 01/07/2018   Procedure: PATENT FORAMEN OVALE (PFO) CLOSURE;  Surgeon: Rexene Alberts, MD;   Location: [place];  Service: Open Heart Surgery;  Laterality: N/A;  . RIGHT/LEFT HEART CATH AND CORONARY ANGIOGRAPHY N/A 11/11/2017   Procedure: RIGHT/LEFT HEART CATH AND CORONARY ANGIOGRAPHY;  Surgeon: Sherren Mocha, MD;  Location: [facility] CV LAB;  Service: Cardiovascular;  Laterality: N/A;  . TEE WITHOUT CARDIOVERSION N/A 09/18/2017   Procedure: TRANSESOPHAGEAL ECHOCARDIOGRAM (TEE);  Surgeon: Skeet Latch, MD;  Location: [place];  Service: Cardiovascular;  Laterality: N/A;  . TEE WITHOUT CARDIOVERSION N/A 01/07/2018   Procedure: TRANSESOPHAGEAL ECHOCARDIOGRAM (TEE);  Surgeon: Rexene Alberts, MD;  Location: [place];  Service: Open Heart Surgery;  Laterality: N/A;  . TOTAL KNEE ARTHROPLASTY     Left  . TUBAL LIGATION      ROS:     As stated in the HPI and negative for all other systems.  PHYSICAL EXAM BP 132/74   Pulse (!) 54   Ht 5\' 3"  (1.6 m)   Wt 130 lb (59 kg)   LMP 11/05/1992   SpO2 98%   BMI 23.03 kg/m   GEN:  No distress NECK:  No jugular venous distention at 90 degrees, waveform within normal limits, carotid upstroke brisk and symmetric, no bruits, no thyromegaly LYMPHATICS:  No cervical adenopathy LUNGS:  Clear to auscultation bilaterally BACK:  No CVA tenderness CHEST:  Unremarkable HEART:  S1 and S2 within normal limits, no S3, no S4, no clicks, no rubs, 2 out of 6 late systolic murmur, no diastolic murmurs ABD:  Positive bowel sounds normal in frequency in pitch, no bruits, no rebound, no guarding, unable to assess midline mass or bruit with the patient seated. EXT:  2 plus pulses throughout, moderate edema, no cyanosis no clubbing   EKG:   NA   ASSESSMENT AND PLAN  Atrial flutter -  She seems to be maintaining sinus rhythm.  She is on anticoagulation.  No change in therapy is indicated.  MITRAL REGURGITATION -  She had some mild residual mitral regurgitation and some stenosis.  At this point no change in therapy is indicated.  No further imaging is  indicated.  HYPERTENSION -  The blood pressure is at target. No change in medications is indicated. We will continue with therapeutic lifestyle changes (TLC).  LEFT MCA STROKE - She is encouraged to try to go to out patient PT.

## 2018-05-21 ENCOUNTER — Encounter: Payer: Self-pay | Admitting: Cardiology

## 2018-05-21 ENCOUNTER — Ambulatory Visit (INDEPENDENT_AMBULATORY_CARE_PROVIDER_SITE_OTHER): Payer: Medicare Other | Admitting: Cardiology

## 2018-05-21 VITALS — BP 132/74 | HR 54 | Ht 63.0 in | Wt 130.0 lb

## 2018-05-21 DIAGNOSIS — I130 Hypertensive heart and chronic kidney disease with heart failure and stage 1 through stage 4 chronic kidney disease, or unspecified chronic kidney disease: Secondary | ICD-10-CM | POA: Diagnosis not present

## 2018-05-21 DIAGNOSIS — I63512 Cerebral infarction due to unspecified occlusion or stenosis of left middle cerebral artery: Secondary | ICD-10-CM | POA: Diagnosis not present

## 2018-05-21 DIAGNOSIS — I340 Nonrheumatic mitral (valve) insufficiency: Secondary | ICD-10-CM | POA: Diagnosis not present

## 2018-05-21 DIAGNOSIS — Z9889 Other specified postprocedural states: Secondary | ICD-10-CM

## 2018-05-21 DIAGNOSIS — I480 Paroxysmal atrial fibrillation: Secondary | ICD-10-CM

## 2018-05-21 DIAGNOSIS — I69351 Hemiplegia and hemiparesis following cerebral infarction affecting right dominant side: Secondary | ICD-10-CM | POA: Diagnosis not present

## 2018-05-21 DIAGNOSIS — I639 Cerebral infarction, unspecified: Secondary | ICD-10-CM

## 2018-05-21 DIAGNOSIS — I5032 Chronic diastolic (congestive) heart failure: Secondary | ICD-10-CM | POA: Diagnosis not present

## 2018-05-21 DIAGNOSIS — I69320 Aphasia following cerebral infarction: Secondary | ICD-10-CM | POA: Diagnosis not present

## 2018-05-21 DIAGNOSIS — N183 Chronic kidney disease, stage 3 (moderate): Secondary | ICD-10-CM | POA: Diagnosis not present

## 2018-05-21 NOTE — Patient Instructions (Signed)
Medication Instructions:  Continue current medication  If you need a refill on your cardiac medications before your next appointment, please call your pharmacy.  Labwork: None Ordered  If you have labs (blood work) drawn today and your tests are completely normal, you will receive your results only by: Marland Kitchen MyChart Message (if you have MyChart) OR . A paper copy in the mail If you have any lab test that is abnormal or we need to change your treatment, we will call you to review the results.  Testing/Procedures: None Ordered  Follow-Up: You will need a follow up appointment in 6 Months.  Please call our office 2 months in [phone]) to schedule the appointment.  You may see  DR Percival Spanish or one of the following Advanced Practice Providers on your designated Care Team:   . Rosaria Ferries, PA-C . Jory Sims, DNP, ANP  At [facility], you and your health needs are our priority.  As part of our continuing mission to provide you with exceptional heart care, we have created designated Provider Care Teams.  These Care Teams include your primary Cardiologist (physician) and Advanced Practice Providers (APPs -  Physician Assistants and Nurse Practitioners) who all work together to provide you with the care you need, when you need it.   Thank you for choosing [facility]!!

## 2018-06-07 ENCOUNTER — Ambulatory Visit (HOSPITAL_BASED_OUTPATIENT_CLINIC_OR_DEPARTMENT_OTHER): Payer: Medicare Other | Admitting: Physical Medicine & Rehabilitation

## 2018-06-07 ENCOUNTER — Encounter: Payer: Medicare Other | Attending: Registered Nurse

## 2018-06-07 ENCOUNTER — Encounter: Payer: Self-pay | Admitting: Physical Medicine & Rehabilitation

## 2018-06-07 VITALS — BP 160/72 | HR 52 | Ht 63.0 in

## 2018-06-07 DIAGNOSIS — I5032 Chronic diastolic (congestive) heart failure: Secondary | ICD-10-CM | POA: Insufficient documentation

## 2018-06-07 DIAGNOSIS — R4701 Aphasia: Secondary | ICD-10-CM | POA: Insufficient documentation

## 2018-06-07 DIAGNOSIS — Z79899 Other long term (current) drug therapy: Secondary | ICD-10-CM | POA: Insufficient documentation

## 2018-06-07 DIAGNOSIS — E785 Hyperlipidemia, unspecified: Secondary | ICD-10-CM | POA: Insufficient documentation

## 2018-06-07 DIAGNOSIS — Z96653 Presence of artificial knee joint, bilateral: Secondary | ICD-10-CM | POA: Insufficient documentation

## 2018-06-07 DIAGNOSIS — I110 Hypertensive heart disease with heart failure: Secondary | ICD-10-CM | POA: Insufficient documentation

## 2018-06-07 DIAGNOSIS — Z8249 Family history of ischemic heart disease and other diseases of the circulatory system: Secondary | ICD-10-CM | POA: Diagnosis not present

## 2018-06-07 DIAGNOSIS — I639 Cerebral infarction, unspecified: Secondary | ICD-10-CM | POA: Diagnosis not present

## 2018-06-07 DIAGNOSIS — I63512 Cerebral infarction due to unspecified occlusion or stenosis of left middle cerebral artery: Secondary | ICD-10-CM | POA: Insufficient documentation

## 2018-06-07 DIAGNOSIS — F482 Pseudobulbar affect: Secondary | ICD-10-CM | POA: Diagnosis not present

## 2018-06-07 DIAGNOSIS — I69351 Hemiplegia and hemiparesis following cerebral infarction affecting right dominant side: Secondary | ICD-10-CM | POA: Diagnosis not present

## 2018-06-07 DIAGNOSIS — Z7901 Long term (current) use of anticoagulants: Secondary | ICD-10-CM | POA: Diagnosis not present

## 2018-06-07 MED ORDER — SERTRALINE HCL 25 MG PO TABS: 25.0000 mg | ORAL_TABLET | Freq: Every day | ORAL | 1 refills | Status: DC

## 2018-06-07 NOTE — Progress Notes (Signed)
Subjective:    Patient ID: Brooke Chavez, female    DOB: [birth year], 77 y.o.   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  Pt usually denies sadness Pt does remain aphasic and apraxic Finished OP PT, OT, SLP Decided against OP therapy due to long drive. Has appt with oncology for "spots on pancreas"   Pain Inventory Average Pain 0 Pain Right Now 0 My pain is na  In the last 24 hours, has pain interfered with the following? General activity 0 Relation with others 0 Enjoyment of life 0 What TIME of day is your pain at its worst? na Sleep (in general) Good  Pain is worse with: na Pain improves with: na Relief from Meds: 0  Mobility use a walker ability to climb steps?  yes do you drive?  no  Function Do you have any goals in this area?  no  Neuro/Psych No problems in this area  Prior Studies Any changes since last visit?  no  Physicians involved in your care Any changes since last visit?  no   Family History  Problem Relation Age of Onset  . Hypertension Mother   . Cancer Sister        leukemia  . Diabetes Brother   . Stroke Sister   . Diabetes Brother   . Heart attack Brother   . Healthy Daughter   . Healthy Son   . Healthy Daughter    Social History   Socioeconomic History  . Marital status: Widowed    Spouse name: Not on file  . Number of children: 3  . Years of education: 56  . Highest education level: Not on file  Occupational History    Comment: na  Social Needs  . Financial resource strain: Not on file  . Food insecurity:    Worry: Not on file    Inability: Not on file  . Transportation needs:    Medical: Not on file    Non-medical: Not on file  Tobacco Use  . Smoking status: Never Smoker  . Smokeless tobacco: Never Used  Substance and Sexual Activity  . Alcohol use: No  . Drug use: No  .  Sexual activity: Never  Lifestyle  . Physical activity:    Days per week: Not on file    Minutes per session: Not on file  . Stress: Not on file  Relationships  . Social connections:    Talks on phone: Not on file    Gets together: Not on file    Attends religious service: Not on file    Active member of club or organization: Not on file    Attends meetings of clubs or organizations: Not on file    Relationship status: Not on file  Other Topics Concern  . Not on file  Social History Narrative   Lives in [place]   Her daughter lives with her   Little caffeine   Past Surgical History:  Procedure Laterality Date  . A FLUTTER ABLATION N/A 06/27/2011   Procedure: ABLATION A FLUTTER;  Surgeon: Thompson Grayer, MD;  Location: [facility] CATH LAB;  Service: Cardiovascular;  Laterality: N/A;  . ATRIAL ABLATION SURGERY  06/2010  . HIP SURGERY     Left (fracture) 3/13  . JOINT REPLACEMENT     both knees   .  KNEE ARTHROSCOPY     Right  . LUMBAR SPINE SURGERY    . MITRAL VALVE REPAIR Right 01/07/2018   Procedure: MINIMALLY INVASIVE MITRAL VALVE REPAIR using LivaNova ring size 28 MM;  Surgeon: Rexene Alberts, MD;  Location: [place];  Service: Open Heart Surgery;  Laterality: Right;  . PATENT FORAMEN OVALE(PFO) CLOSURE N/A 01/07/2018   Procedure: PATENT FORAMEN OVALE (PFO) CLOSURE;  Surgeon: Rexene Alberts, MD;  Location: [place];  Service: Open Heart Surgery;  Laterality: N/A;  . RIGHT/LEFT HEART CATH AND CORONARY ANGIOGRAPHY N/A 11/11/2017   Procedure: RIGHT/LEFT HEART CATH AND CORONARY ANGIOGRAPHY;  Surgeon: Sherren Mocha, MD;  Location: [place] CV LAB;  Service: Cardiovascular;  Laterality: N/A;  . TEE WITHOUT CARDIOVERSION N/A 09/18/2017   Procedure: TRANSESOPHAGEAL ECHOCARDIOGRAM (TEE);  Surgeon: Skeet Latch, MD;  Location: [place];  Service: Cardiovascular;  Laterality: N/A;  . TEE WITHOUT CARDIOVERSION N/A 01/07/2018   Procedure: TRANSESOPHAGEAL ECHOCARDIOGRAM (TEE);  Surgeon: Rexene Alberts, MD;  Location: [place];  Service: Open Heart Surgery;  Laterality: N/A;  . TOTAL KNEE ARTHROPLASTY     Left  . TUBAL LIGATION     Past Medical History:  Diagnosis Date  . Atrial flutter ([place])    Typical by EKG diagnosis 9/11 s/p CIT ablation 11/11  . Bradycardia   . Chronic diastolic congestive heart failure ([place])   . DJD (degenerative joint disease)   . Dysrhythmia   . Femur fracture, left ([place]) 2013  . Hyperlipidemia    x5 years  . Hypertension    Since 1997  . Liver masses 11/13/2017   Multiple small nodules seen on CT and MRI  . Mitral regurgitation    severe  . 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 Stroke ([place])   . TR (tricuspid regurgitation)    Mild with RA enlargment   LMP 11/05/1992   Opioid Risk Score:   Fall Risk Score:  `1  Depression screen PHQ 2/9  Depression screen [facility] 2/9 03/05/2018 03/02/2018 11/03/2017 05/19/2017 04/28/2017 09/09/2016 02/14/2016  Decreased Interest 0 2 0 0 0 0 0  Down, Depressed, Hopeless 0 2 0 0 0 0 0  PHQ - 2 Score 0 4 0 0 0 0 0  Altered sleeping 0 3 - - - - -  Tired, decreased energy 0 3 - - - - -  Change in appetite 0 2 - - - - -  Feeling bad or failure about yourself  0 1 - - - - -  Trouble concentrating 0 1 - - - - -  Moving slowly or fidgety/restless 0 2 - - - - -  Suicidal thoughts 0 0 - - - - -  PHQ-9 Score 0 16 - - - - -  Some recent data might be hidden     Review of Systems  Constitutional: Negative.   HENT: Negative.   Eyes: Negative.   Respiratory: Negative.   Cardiovascular: Negative.   Gastrointestinal: Negative.   Endocrine: Negative.   Genitourinary: Negative.   Musculoskeletal: Positive for gait problem.  Skin: Negative.   Allergic/Immunologic: Negative.   Hematological: Negative.     Psychiatric/Behavioral: Positive for confusion.  All other systems reviewed and are negative.      Objective:   Physical Exam  Constitutional: She appears well-developed and well-nourished.  HENT:  Head: Normocephalic and atraumatic.  Eyes: Pupils are equal, round, and reactive to light. EOM are normal.  Neurological:  Requires gestural cuing for MMT Difficult to get full effort but appears 5/5 in bUE and BLE  amb with HHA no evidence of foot drag or knee instability Wide based gait  Skin: Skin is warm and dry.  Psychiatric: Her affect is labile.  Crying during most of exam but denies sadness  Nursing note and vitals reviewed.         Assessment & Plan:  1.  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  Emotional lability due to CVA doubt depression, trial sertraline 25mg  daily , if no improvement in 1 wk family to call and we can increase to 50mg

## 2018-06-10 ENCOUNTER — Ambulatory Visit (INDEPENDENT_AMBULATORY_CARE_PROVIDER_SITE_OTHER): Payer: Medicare Other

## 2018-06-10 DIAGNOSIS — N183 Chronic kidney disease, stage 3 (moderate): Secondary | ICD-10-CM | POA: Diagnosis not present

## 2018-06-10 DIAGNOSIS — I69351 Hemiplegia and hemiparesis following cerebral infarction affecting right dominant side: Secondary | ICD-10-CM | POA: Diagnosis not present

## 2018-06-10 DIAGNOSIS — I5032 Chronic diastolic (congestive) heart failure: Secondary | ICD-10-CM

## 2018-06-10 DIAGNOSIS — Z7901 Long term (current) use of anticoagulants: Secondary | ICD-10-CM

## 2018-06-10 DIAGNOSIS — I69320 Aphasia following cerebral infarction: Secondary | ICD-10-CM | POA: Diagnosis not present

## 2018-06-10 DIAGNOSIS — R1310 Dysphagia, unspecified: Secondary | ICD-10-CM | POA: Diagnosis not present

## 2018-06-10 DIAGNOSIS — I480 Paroxysmal atrial fibrillation: Secondary | ICD-10-CM | POA: Diagnosis not present

## 2018-06-10 DIAGNOSIS — M810 Age-related osteoporosis without current pathological fracture: Secondary | ICD-10-CM

## 2018-06-10 DIAGNOSIS — Z9181 History of falling: Secondary | ICD-10-CM

## 2018-06-10 DIAGNOSIS — I69391 Dysphagia following cerebral infarction: Secondary | ICD-10-CM

## 2018-06-10 DIAGNOSIS — I130 Hypertensive heart and chronic kidney disease with heart failure and stage 1 through stage 4 chronic kidney disease, or unspecified chronic kidney disease: Secondary | ICD-10-CM

## 2018-06-10 DIAGNOSIS — I4892 Unspecified atrial flutter: Secondary | ICD-10-CM | POA: Diagnosis not present

## 2018-06-14 ENCOUNTER — Telehealth: Payer: Self-pay | Admitting: Oncology

## 2018-06-14 ENCOUNTER — Inpatient Hospital Stay: Payer: Medicare Other | Attending: Oncology | Admitting: Oncology

## 2018-06-14 VITALS — BP 147/76 | HR 76 | Temp 98.2°F | Resp 18 | Ht 63.0 in | Wt 130.3 lb

## 2018-06-14 DIAGNOSIS — K769 Liver disease, unspecified: Secondary | ICD-10-CM

## 2018-06-14 DIAGNOSIS — K8689 Other specified diseases of pancreas: Secondary | ICD-10-CM

## 2018-06-14 NOTE — Progress Notes (Signed)
[place] Patient Consult   Requesting MD: [facility]  Brooke Chavez 77 y.o.  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  During work-up for the heart surgery Brooke Chavez underwent a CT angiogram of the chest, abdomen, and pelvis 11/13/2017.  Multiple enhancing lesions were noted in the liver measuring up to 1.3 cm.  Liver cysts were also noted.  A 0.8 cm hypervascular lesion was noted in the pancreas body.  No ductal dilatation.  No adenopathy.  An MRI of the abdomen on 11/21/2017 revealed multiple hepatic cyst.  Scattered hypervascular lesions were noted in both lobes of the liver ring up to 13 mm only evident on the early arterial phase.  The hypervascular lesion noted on CT was not seen on the MRI.  There was a corresponding diffusion abnormality in this area. Brooke Chavez was referred for a Netspot study on 11/30/2017.  There is no tracer uptake above background in the liver.  The lesion in the tail the pancreas had intense FDG uptake.  Brooke Chavez proceeded with the cardiac surgery.  On 01/24/2018 Brooke Chavez developed a large left MCA stroke.  Brooke Chavez had a prolonged hospitalization and rehabilitation stay.  Brooke Chavez was referred to medical oncology in the spring, but has been unable to come until now.  Brooke Chavez is here today with Brooke Chavez daughter and grandson.  Brooke Chavez has returned home and feels well.    Past Medical History:  Diagnosis Date  . Atrial flutter ([place])    Typical by EKG diagnosis 9/11 s/p CIT ablation 11/11  . Bradycardia   . Chronic diastolic congestive heart failure ([place])   . DJD (degenerative joint disease)   . Dysrhythmia   . Femur fracture, left ([place]) 2013  . Hyperlipidemia    x5 years  . Hypertension    Since 1997  . Liver masses 11/13/2017   Multiple small nodules seen on CT and MRI  . Mitral regurgitation    severe  . Pancreatic mass  11/13/2017  . 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 Stroke ([place])  01/24/2018  . TR (tricuspid regurgitation)    Mild with RA enlargment    .  G3, P3  Past Surgical History:  Procedure Laterality Date  . A FLUTTER ABLATION N/A 06/27/2011   Procedure: ABLATION A FLUTTER;  Surgeon: Thompson Grayer, MD;  Location: [facility] CATH LAB;  Service: Cardiovascular;  Laterality: N/A;  . ATRIAL ABLATION SURGERY  06/2010  . HIP SURGERY     Left (fracture) 3/13  . JOINT REPLACEMENT     both knees   . KNEE ARTHROSCOPY     Right  . LUMBAR SPINE SURGERY    . MITRAL VALVE REPAIR Right 01/07/2018   Procedure: MINIMALLY INVASIVE MITRAL VALVE REPAIR using LivaNova ring size 28 MM;  Surgeon: Rexene Alberts, MD;  Location: [place];  Service: Open Heart Surgery;  Laterality: Right;  . PATENT FORAMEN OVALE(PFO) CLOSURE N/A 01/07/2018   Procedure: PATENT FORAMEN OVALE (PFO) CLOSURE;  Surgeon: Rexene Alberts, MD;  Location: [place];  Service: Open Heart Surgery;  Laterality: N/A;  . RIGHT/LEFT HEART CATH AND CORONARY ANGIOGRAPHY N/A 11/11/2017   Procedure: RIGHT/LEFT HEART CATH AND CORONARY ANGIOGRAPHY;  Surgeon: Burt Knack,  Legrand Como, MD;  Location: [place] CV LAB;  Service: Cardiovascular;  Laterality: N/A;  . TEE WITHOUT CARDIOVERSION N/A 09/18/2017   Procedure: TRANSESOPHAGEAL ECHOCARDIOGRAM (TEE);  Surgeon: Skeet Latch, MD;  Location: [place];  Service: Cardiovascular;  Laterality: N/A;  . TEE WITHOUT CARDIOVERSION N/A 01/07/2018   Procedure: TRANSESOPHAGEAL ECHOCARDIOGRAM (TEE);  Surgeon: Rexene Alberts, MD;  Location: [place];  Service: Open Heart Surgery;  Laterality: N/A;  . TOTAL KNEE ARTHROPLASTY     Left  . TUBAL LIGATION      Medications: Reviewed  Allergies:  Allergies  Allergen Reactions  . Oxycodone-Acetaminophen Anxiety and  Other (See Comments)    Hallucinations  . Aspirin Nausea And Vomiting and Nausea Only    Other reaction(s): GI Upset (intolerance)    Chavez history: A nephew had leukemia.  No other Chavez history of cancer  Social History:   Brooke Chavez lives with Brooke Chavez daughter and nephew in [place].  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  Brooke Chavez does not use cigarettes or alcohol.  No transfusion history.  No risk factor for HIV or hepatitis.  ROS:   Positives include: Right sided weakness following the CVA  A complete ROS was otherwise negative.  Physical Exam:  Blood pressure (!) 147/76, pulse 76, temperature 98.2 F (36.8 C), temperature source Oral, resp. rate 18, height 5\' 3"  (1.6 m), weight 130 lb 4.8 oz (59.1 kg), last menstrual period 11/05/1992, SpO2 100 %.  HEENT: Oropharynx without visible mass, neck without mass Lungs: Clear bilaterally Cardiac: Regular rate and rhythm Abdomen: No hepatosplenomegaly, no mass, nontender  Vascular: No leg edema Lymph nodes: No cervical, supraclavicular, axillary, or inguinal nodes Neurologic: Alert and oriented.  Expressive a aphasia.  Mild right facial droop.  Mild weakness of the right arm and leg.  Brooke Chavez was able to ambulate to the examination table with minimal assistance Skin: No rash Musculoskeletal: Spine tenderness   LAB:  CBC  Lab Results  Component Value Date   WBC 9.4 03/13/2018   HGB 14.3 03/13/2018   HCT 45.3 03/13/2018   MCV 96.2 03/13/2018   PLT 214 03/13/2018   NEUTROABS 7.3 03/13/2018        CMP  Lab Results  Component Value Date   NA 138 03/13/2018   K 5.2 (H) 03/13/2018   CL 104 03/13/2018   CO2 21 (L) 03/13/2018   GLUCOSE 87 03/13/2018   BUN 16 03/13/2018   CREATININE 1.58 (H) 03/13/2018   CALCIUM 9.6 03/13/2018   PROT 6.9 03/13/2018   ALBUMIN 3.5 03/13/2018   AST 29 03/13/2018   ALT 14 03/13/2018   ALKPHOS 74 03/13/2018   BILITOT 1.5 (H) 03/13/2018   GFRNONAA 30 (L) 03/13/2018   GFRAA 35 (L)  03/13/2018      Imaging:  As per HPI, CT, PET, and MRI images reviewed with Brooke Chavez and Brooke Chavez   Assessment/Plan:   1. Pancreas mass  0.8 cm hypervascular lesion in the pancreas body and multiple enhancing liver lesions noted on CT 11/13/2017  MRI 11/21/2017-hypervascular liver lesions evident on arterial phase, pancreas lesion not identified  Netspot 11/30/2017- intense uptake at the pancreas tail lesion consistent with a primary neuroendocrine neoplasm, no uptake in the liver above background 2. Atrial flutter 3. 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 Left MCA CVA 01/24/2018 5. Hypertension    Disposition:   Brooke Chavez was found to have a hypervascular lesion in the pancreas when Brooke Chavez was  undergoing work-up for a mitral valve repair.  Brooke Chavez was also noted to have hypervascular lesions in the liver.  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  However liver lesions were not apparent on the Netspot study.  Brooke Chavez appears asymptomatic from the neuroendocrine tumor.  We discussed diagnostic options.  We discussed an upper EUS to biopsy the pancreas lesion.  Brooke Chavez understands the only reason to pursue a diagnostic biopsy is if treatment will be recommended.  I discussed biopsy and treatment options with Brooke Chavez and Brooke Chavez daughter.  Brooke Chavez is most comfortable with observation.  Brooke Chavez will return for an office visit in 3 months.  We will consider a repeat CT of the abdomen to evaluate the pancreas mass and liver lesions in 3-6 months.  I will present Brooke Chavez case of the GI tumor conference.  Betsy Coder, MD  06/14/2018, 2:39 PM

## 2018-06-14 NOTE — Progress Notes (Signed)
Met with [name], daughter and grandson.  Patient is aphasic from recent stroke. Her daughter was the historian. Explained role of nurse navigator.  Contact names and phone numbers were provided for entire [facility] team.  No treatment recommended at this time.   Will continue to follow as needed

## 2018-06-14 NOTE — Telephone Encounter (Signed)
Appts scheduled avs/calendar printed per 11/4 los °

## 2018-06-21 ENCOUNTER — Encounter: Payer: Self-pay | Admitting: Family Medicine

## 2018-06-21 ENCOUNTER — Ambulatory Visit (INDEPENDENT_AMBULATORY_CARE_PROVIDER_SITE_OTHER): Payer: Medicare Other

## 2018-06-21 ENCOUNTER — Ambulatory Visit (INDEPENDENT_AMBULATORY_CARE_PROVIDER_SITE_OTHER): Payer: Medicare Other | Admitting: Family Medicine

## 2018-06-21 VITALS — BP 142/70 | HR 50 | Temp 97.3°F | Ht 63.0 in | Wt 132.0 lb

## 2018-06-21 DIAGNOSIS — Z96653 Presence of artificial knee joint, bilateral: Secondary | ICD-10-CM | POA: Diagnosis not present

## 2018-06-21 DIAGNOSIS — M25561 Pain in right knee: Secondary | ICD-10-CM | POA: Insufficient documentation

## 2018-06-21 DIAGNOSIS — M25552 Pain in left hip: Secondary | ICD-10-CM

## 2018-06-21 DIAGNOSIS — M810 Age-related osteoporosis without current pathological fracture: Secondary | ICD-10-CM | POA: Diagnosis not present

## 2018-06-21 DIAGNOSIS — I4891 Unspecified atrial fibrillation: Secondary | ICD-10-CM

## 2018-06-21 DIAGNOSIS — M25551 Pain in right hip: Secondary | ICD-10-CM

## 2018-06-21 DIAGNOSIS — R4701 Aphasia: Secondary | ICD-10-CM | POA: Insufficient documentation

## 2018-06-21 DIAGNOSIS — I69351 Hemiplegia and hemiparesis following cerebral infarction affecting right dominant side: Secondary | ICD-10-CM

## 2018-06-21 DIAGNOSIS — I6930 Unspecified sequelae of cerebral infarction: Secondary | ICD-10-CM

## 2018-06-21 DIAGNOSIS — M25562 Pain in left knee: Secondary | ICD-10-CM

## 2018-06-21 DIAGNOSIS — Z471 Aftercare following joint replacement surgery: Secondary | ICD-10-CM | POA: Diagnosis not present

## 2018-06-21 MED ORDER — TRAMADOL HCL 50 MG PO TABS: 50.0000 mg | ORAL_TABLET | Freq: Four times a day (QID) | ORAL | 0 refills | Status: AC

## 2018-06-21 NOTE — Progress Notes (Signed)
Subjective:  Patient ID: Brooke Chavez, female    DOB: 06/12/1941  Age: 77 y.o. MRN: [id number]  CC: right lower leg swelling and Fall (this AM - 2 am )   HPI [name] presents for falling overnight when she got up from the bathroom.  Patient has had a stroke approximately 5 months ago.  She is weak on the right side.  She got up to walk to the bathroom about 2 AM and fell.  She was found by her daughter.  She helped her up and she went back to bed.  Later in the day they noticed swelling in the right lower extremity so they decided to bring her here.  The patient is nonverbal other than crying.  There is apparent expressive aphasia.  However she was able to point at her knees when asked where she hurt.  She is able to walk slowly with a cane and assist from her daughter.  She has had bilateral knee replacements in the past and left hip ORIF in 2013.  Depression screen [facility] 2/9 06/21/2018 03/05/2018 03/02/2018  Decreased Interest 0 0 2  Down, Depressed, Hopeless 0 0 2  PHQ - 2 Score 0 0 4  Altered sleeping - 0 3  Tired, decreased energy - 0 3  Change in appetite - 0 2  Feeling bad or failure about yourself  - 0 1  Trouble concentrating - 0 1  Moving slowly or fidgety/restless - 0 2  Suicidal thoughts - 0 0  PHQ-9 Score - 0 16  Some recent data might be hidden    History Brooke Chavez has a past medical history of Atrial flutter ([place]), Bradycardia, Chronic diastolic congestive heart failure ([place]), 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   She has a past surgical history that includes Knee arthroscopy; Tubal  ligation; Lumbar spine surgery; Total knee arthroplasty; Atrial ablation surgery (06/2010); Hip surgery; A flutter ablation (N/A, 06/27/2011); TEE without cardioversion (N/A, 09/18/2017); RIGHT/LEFT HEART CATH AND CORONARY ANGIOGRAPHY (N/A, 11/11/2017); Joint replacement; Mitral valve repair (Right, 01/07/2018); TEE without cardioversion (N/A, 01/07/2018); and PATENT FORAMEN OVALE(PFO) CLOSURE (N/A, 01/07/2018).   Her family history includes Cancer in her sister; Diabetes in her brother and brother; Healthy in her daughter, daughter, and son; Heart attack in her brother; Hypertension in her mother; Stroke in her sister.She reports that she has never smoked. She has never used smokeless tobacco. She reports that she does not drink alcohol or use drugs.    ROS Review of Systems  Unable to perform ROS: Patient nonverbal    Objective:  BP (!) 142/70 (BP Location: Left Arm)   Pulse (!) 50   Temp (!) 97.3 F (36.3 C) (Oral)   Ht 5\' 3"  (1.6 m)   Wt 132 lb (59.9 kg)   LMP 11/05/1992   BMI 23.38 kg/m   BP Readings from Last 3 Encounters:  06/21/18 (!) 142/70  06/14/18 (!) 147/76  06/07/18 (!) 160/72    Wt Readings from Last 3 Encounters:  06/21/18 132 lb (59.9 kg)  06/14/18 130 lb 4.8 oz (59.1 kg)  05/21/18 130 lb (59 kg)     Physical Exam  Constitutional: She appears distressed (Tearful).  HENT:  Head: Normocephalic and  atraumatic.  Cardiovascular: Normal rate and regular rhythm.  Murmur heard. Pulmonary/Chest: Effort normal and breath sounds normal.  Abdominal: Soft. There is no tenderness.  Musculoskeletal: She exhibits edema (1-2+ right lower leg) and tenderness (For palpation of each knee and passive range of motion).  Neurological: She is alert.  Patient responds by touching her knees when asked where it hurts.  She is able to follow simple instructions indicating that she is not receptive to be a phasic.  However she has an ability to express herself verbally.  She answers all  questions "no."  Skin: Skin is warm and dry.   X-ray bilateral knees and hips: Total knee a arthroplasty noted bilaterally.  ORIF left hip noted.  All appear to be intact.  No fracture of the joints apparent.   Assessment & Plan:   Jimesha was seen today for right lower leg swelling and fall.  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 1-2 tablets up to 4 times a day as needed for pain       I am having Elysia M. Bos start on traMADol. I am also having her maintain her acetaminophen, apixaban, atorvastatin, spironolactone, magnesium oxide, and sertraline.  Allergies as of 06/21/2018      Reactions   Oxycodone-acetaminophen Anxiety, Other (See Comments)   Hallucinations   Aspirin Nausea And Vomiting, Nausea Only   Other reaction(s): GI Upset (intolerance)      Medication List        Accurate as of 06/21/18  7:00 PM. Always use your most recent med list.          acetaminophen 325 MG tablet Commonly known as:  TYLENOL Take 2 tablets (650 mg total) by mouth every 6 (six) hours as needed for mild pain (or Fever >/= 101).   apixaban 5 MG Tabs tablet Commonly known as:  ELIQUIS Take 1 tablet (5 mg total) by mouth 2 (two) times daily.   atorvastatin 40 MG tablet Commonly known as:  LIPITOR Take 1 tablet (40 mg total) by mouth at bedtime.   magnesium oxide 400 MG tablet Commonly known as:   MAG-OX Take 1 tablet (400 mg total) by mouth daily.   sertraline 25 MG tablet Commonly known as:  ZOLOFT Take 1 tablet (25 mg total) by mouth daily.   spironolactone 25 MG tablet Commonly known as:  ALDACTONE Take 1 tablet (25 mg total) by mouth daily.   traMADol 50 MG tablet Commonly known as:  ULTRAM Take 1 tablet (50 mg total) by mouth 4 (four) times daily for 5 days. 1-2 tablets up to 4 times a day as needed for pain        Follow-up: Return if symptoms worsen or fail to improve.  Claretta Fraise, M.D.

## 2018-06-22 ENCOUNTER — Ambulatory Visit: Payer: Medicare Other | Admitting: Nurse Practitioner

## 2018-06-22 ENCOUNTER — Ambulatory Visit: Payer: Medicare Other | Admitting: Adult Health

## 2018-06-22 NOTE — Progress Notes (Deleted)
[name] [address] [place]. [place] [address] [phone]       OFFICE FOLLOW UP NOTE  [name] Date of Birth:  13-May-1941 Medical Record Number:  [id number]   Reason for visit: Stroke follow up  CHIEF COMPLAINT:  No chief complaint on file.   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 History obtained from patient and chart review. Reviewed all radiology images and labs personally.  [name] is a 77 y.o. female with history of atrial flutter, mitral regurgitation status post mitral valve repair 2 weeks ago, bradycardia, and hyperlipidemia who was found by family to have right sided weakness and aphasia the morning after going to bed and was brought into [facility] ED. She was in her normal state of health the night prior. Out of the window for tPA.  CT had reviewed and showed a large left MCA infarct and hyperdense left ICA and left MCA c/w thrombus.  Patient transferred to [facility] for possible endovascular intervention for left M1 occlusion.  CTA head and neck showed left M1 occlusion with 8 mm thrombus and left MCA infarct 37 cc.  CT perfusion showed large penumbra 78 cc. High risk for IR due to pseudo normalization on CTP and increased risk of hemorrhage. Repeat CT showed evolving left MCA infarct along with dense left MCA and old left cerebellar infarct.  MRI showed large left MCA infarct with HD, 2 mm left to right shift and old left cerebellar infarct.  2D echo showed an EF of 50 to 55% without cardiac source of embolus and also negative for PFO.  LDL 75 and recommended to continue Lipitor 40 mg at discharge.  Blood pressure stable throughout admission and recommended long-term BP goal normotensive.  A1c satisfactory at 5.3.  Patient was on warfarin prior to admission for atrial flutter with INR 1.79.  This was held due to high risk of hemorrhage during admission.  Patient was discharged to  [facility] for continued need of therapies.  Patient was discharged home on 02/13/2018 and it was decided to resume Eliquis at that time for atrial flutter management instead of Coumadin as CT scans have been stable. Patient returned to [facility] ED on 02/20/2018 for bradycardia and was discharged home in stable condition with all negative testing.  Patient returned to [name] ED on 02/22/2018 for chest pain but again all negative testing.  Patient returned to [name] ED on 03/07/2018 due to patient grimacing with any type of movement since her fall in a few days prior.  X-rays were performed by PCP a couple days prior which showed degenerative changes and old compression fractures but no acute findings and was advised to take Tylenol as needed for pain and to continue following up with PCP.  Patient return to ED on 03/13/2018 due to increased pain.  CT showed T12 fracture without wedging or retropulsion and no further injury and it was determined that this was likely the source of her pain.  Patient was treated with lidocaine patch and was discharged back home in stable condition.  03/18/2018 visit: Patient is being seen today for hospital follow-up and is accompanied by her daughter.  She continues to have right hemiparesis along with global aphasia.  She is currently living with both her daughters and participating in home PT/OT/ST where she has been making some improvement.  She is currently sitting in wheelchair  but is able to use rolling walker for short distances.  She has not had an additional fall since her fall back in July.  She has been this somewhat limited due to pain in her back from her fall.  Her speech has been improving some and daughter states that times she is able to say sentences where they can understand her but she has great difficulty when answering questions and at times understanding what others are saying to her.  Her speech does get worse when she has increased fatigue, with increased  anxiety or with excitement.  She continues to take Eliquis with mild bruising but no bleeding.  Continues to take Lipitor without side effects myalgias.  Blood pressure today mildly elevated at 141/79 and as this is monitored at home it is typically lower.  Daughter has found 2 small nodules on the right lower extremity and upon palpation feels as though these could be small cysts.  Patient does not complain of pain upon palpation nor they want to touch or observe redness and area.  Recommended to speak to PCP in regards to these just for continuation of monitoring or if she feels as though they are getting larger in size quickly, to be seen for this.  Denies new or worsening stroke/TIA symptoms.  Interval history 06/22/2018: Patient is being seen today for scheduled follow-up visit.  Patient was seen by her PCP yesterday after falling the night before while she was ambulating to the bathroom in the middle the night.  She was found by her daughter who helped her ambulate back in the bed.  Later that day, she was having swelling in the right lower extremity along with pain in her knees and hips.  X-ray was obtained which was negative for fracture or abnormality.  She was started on tramadol as needed for pain.   ROS:   14 system review of systems performed and negative with exception of easy bruising, joint pain and runny nose  PMH:  Past Medical History:  Diagnosis Date  . Atrial flutter ([place])    Typical by EKG diagnosis 9/11 s/p CIT ablation 11/11  . Bradycardia   . Chronic diastolic congestive heart failure ([place])   . 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 DJD (degenerative joint disease)   . Dysrhythmia   . Femur fracture, left ([place]) 2013  . Global aphasia   . Hyperlipidemia    x5 years  . Hypertension    Since 1997  . 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 Liver masses 11/13/2017   Multiple  small nodules seen on CT and MRI  . Mitral regurgitation    severe  . 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 Stroke ([place])   . TR (tricuspid regurgitation)    Mild with RA enlargment    PSH:  Past Surgical History:  Procedure Laterality Date  . A FLUTTER ABLATION N/A 06/27/2011   Procedure: ABLATION A FLUTTER;  Surgeon: Thompson Grayer, MD;  Location: [facility] CATH LAB;  Service: Cardiovascular;  Laterality: N/A;  . ATRIAL ABLATION SURGERY  06/2010  . HIP SURGERY     Left (fracture) 3/13  . JOINT REPLACEMENT     both knees   .  KNEE ARTHROSCOPY     Right  . LUMBAR SPINE SURGERY    . MITRAL VALVE REPAIR Right 01/07/2018   Procedure: MINIMALLY INVASIVE MITRAL VALVE REPAIR using LivaNova ring size 28 MM;  Surgeon: Rexene Alberts, MD;  Location: [facility];  Service: Open Heart Surgery;  Laterality: Right;  . PATENT FORAMEN OVALE(PFO) CLOSURE N/A 01/07/2018   Procedure: PATENT FORAMEN OVALE (PFO) CLOSURE;  Surgeon: Rexene Alberts, MD;  Location: [facility];  Service: Open Heart Surgery;  Laterality: N/A;  . RIGHT/LEFT HEART CATH AND CORONARY ANGIOGRAPHY N/A 11/11/2017   Procedure: RIGHT/LEFT HEART CATH AND CORONARY ANGIOGRAPHY;  Surgeon: Sherren Mocha, MD;  Location: [facility] CV LAB;  Service: Cardiovascular;  Laterality: N/A;  . TEE WITHOUT CARDIOVERSION N/A 09/18/2017   Procedure: TRANSESOPHAGEAL ECHOCARDIOGRAM (TEE);  Surgeon: Skeet Latch, MD;  Location: [facility];  Service: Cardiovascular;  Laterality: N/A;  . TEE WITHOUT CARDIOVERSION N/A 01/07/2018   Procedure: TRANSESOPHAGEAL ECHOCARDIOGRAM (TEE);  Surgeon: Rexene Alberts, MD;  Location: [facility];  Service: Open Heart Surgery;  Laterality: N/A;  . TOTAL KNEE ARTHROPLASTY     Left  . TUBAL LIGATION      Social History:  Social History    Socioeconomic History  . Marital status: Widowed    Spouse name: Not on file  . Number of children: 3  . Years of education: 36  . Highest education level: Not on file  Occupational History    Comment: na  Social Needs  . Financial resource strain: Not on file  . Food insecurity:    Worry: Not on file    Inability: Not on file  . Transportation needs:    Medical: Not on file    Non-medical: Not on file  Tobacco Use  . Smoking status: Never Smoker  . Smokeless tobacco: Never Used  Substance and Sexual Activity  . Alcohol use: No  . Drug use: No  . Sexual activity: Never  Lifestyle  . Physical activity:    Days per week: Not on file    Minutes per session: Not on file  . Stress: Not on file  Relationships  . Social connections:    Talks on phone: Not on file    Gets together: Not on file    Attends religious service: Not on file    Active member of club or organization: Not on file    Attends meetings of clubs or organizations: Not on file    Relationship status: Not on file  . Intimate partner violence:    Fear of current or ex partner: Not on file    Emotionally abused: Not on file    Physically abused: Not on file    Forced sexual activity: Not on file  Other Topics Concern  . Not on file  Social History Narrative   Lives in [place]   Her daughter lives with her   Little caffeine    Family History:  Family History  Problem Relation Age of Onset  . Hypertension Mother   . Cancer Sister        leukemia  . Diabetes Brother   . Stroke Sister   . Diabetes Brother   . Heart attack Brother   . Healthy Daughter   . Healthy Son   . Healthy Daughter     Medications:   Current Outpatient Medications on File Prior to Visit  Medication Sig Dispense Refill  . acetaminophen (TYLENOL) 325 MG tablet Take 2 tablets (650 mg total) by mouth  every 6 (six) hours as needed for mild pain (or Fever >/= 101).    Marland Kitchen apixaban (ELIQUIS) 5 MG TABS tablet Take 1 tablet (5 mg  total) by mouth 2 (two) times daily. 60 tablet 1  . atorvastatin (LIPITOR) 40 MG tablet Take 1 tablet (40 mg total) by mouth at bedtime. 90 tablet 1  . magnesium oxide (MAG-OX) 400 MG tablet Take 1 tablet (400 mg total) by mouth daily. 30 tablet 1  . sertraline (ZOLOFT) 25 MG tablet Take 1 tablet (25 mg total) by mouth daily. 30 tablet 1  . spironolactone (ALDACTONE) 25 MG tablet Take 1 tablet (25 mg total) by mouth daily. 90 tablet 3  . traMADol (ULTRAM) 50 MG tablet Take 1 tablet (50 mg total) by mouth 4 (four) times daily for 5 days. 1-2 tablets up to 4 times a day as needed for pain 20 tablet 0   No current facility-administered medications on file prior to visit.     Allergies:   Allergies  Allergen Reactions  . Oxycodone-Acetaminophen Anxiety and Other (See Comments)    Hallucinations  . Aspirin Nausea And Vomiting and Nausea Only    Other reaction(s): GI Upset (intolerance)     Physical Exam  There were no vitals filed for this visit. There is no height or weight on file to calculate BMI. No exam data present  General: Chavez developed, Chavez nourished, seated, in no evident distress Head: head normocephalic and atraumatic.   Neck: supple with no carotid or supraclavicular bruits Cardiovascular: regular rate and rhythm, no murmurs Musculoskeletal: no deformity Skin:  no rash/petichiae Vascular:  Normal pulses all extremities  Neurologic Exam Mental Status: Awake and fully alert.  Unable to assess orientation due to moderate global aphasia.  Mood and affect appropriate.  Cranial Nerves: Fundoscopic exam reveals sharp disc margins. Pupils equal, briskly reactive to light. Extraocular movements full without nystagmus. Visual fields full to confrontation. Hearing intact. Facial sensation intact.  Mild right facial droop present Motor: Normal bulk and tone.  RUE: 4/5, RLE: Unable to accurately assess strength due to pain in lower back; LUE: 5/5, LLE: Unable to accurately assess  strength due to pain in lower back Sensory.: intact to touch , pinprick , position and vibratory sensation.  Coordination: Rapid alternating movements normal in all extremities. Finger-to-nose and heel-to-shin performed accurately bilaterally. Gait and Station: Patient currently sitting in wheelchair and as she does not have rolling walker, gait was deferred at this time Reflexes: 1+ and symmetric. Toes downgoing.    NIHSS  6 Modified Rankin  4   Diagnostic Data (Labs, Imaging, Testing)  CT head without contrast 01/24/2018 IMPRESSION: 1. Large territory acute left MCA infarct without hemorrhage. Hyperdense left internal carotid artery and left MCA compatible with acute thrombosis. 2. ASPECTS is 4  CT angio head with and without contrast CT angio neck with and without contrast CT cerebral perfusion with contrast 01/24/2018 IMPRESSION: 1. Left proximal M1 occlusion with thrombus measuring approximately 8 mm in length. Intermediate collaterals. 2. Left MCA distribution infarction with perfusion CT calculated core of 37 cc corresponding to the infarct on noncontrast CT. The calculated core may slightly underestimate the actual core as the region of infarct visible on noncontrast CT (ASPECTS 4) may have areas of pseudonormalization. 3. Large perfusion penumbra, 78 cc. 4. Patent carotid and vertebral arteries in the neck. No significant stenosis by NASCET criteria, dissection, or aneurysm. 5. Patent right MCA, bilateral ACA, and bilateral PCA distributions.  MRI brain without contrast  01/25/2018 IMPRESSION: 1. 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 2 mm LEFT-to-RIGHT midline shift. 2. Susceptibility artifact seen with old PRES, chronic hypertension, less likely amyloid angiopathy. 3. Old LEFT cerebellar infarct.  CT head without contrast (repeat) 01/31/2018 IMPRESSION: 1. Continued interval evolution of subacute left MCA territory infarct with evidence  for hemorrhagic transformation. Mildly decreased edema and regional mass effect as compared to previous. 2. No other new intracranial abnormality.     ASSESSMENT: Brooke Chavez is a 77 y.o. year old female here with left MCA infarct on 01/24/2018 secondary to left M1 occlusion likely secondary to atrial flutter with subtherapeutic INR post MV repair 2 weeks ago. Vascular risk factors include atrial flutter, HTN and HLD.  Patient is being seen today for hospital follow-up and does have residual right hemiparesis and global aphasia.    PLAN: -HTN: Continue current medication regimen continue follow-up with PCP and monitoring BP levels at home -HLD: Continue atorvastatin for secondary stroke prevention with continuing monitoring and prescribing from PCP -Atrial flutter: Continue Eliquis for management and secondary stroke prevention along with continued follow-up by cardiology for Eliquis prescribing and management -Continue home PT/OT/ST and advised daughter that if additional outpatient therapy was needed to contact us for additional orders -Continue to monitor right lower extremity nodules for increase in size or if become painful along with following up with PCP regarding needs for continued monitoring -Maintain strict control of hypertension with blood pressure goal below 130/90, diabetes with hemoglobin A1c goal below 6.5% and cholesterol with LDL cholesterol (bad cholesterol) goal below 70 mg/dL. I also advised the patient to eat a healthy diet with plenty of whole grains, cereals, fruits and vegetables, exercise regularly and maintain ideal body weight.  Follow up in 3 months or call earlier if needed   Greater than 50% of time during this 25 minute visit was spent on counseling,explanation of diagnosis of left MCA infarct with hemorrhagic transformation, reviewing risk factor management of atrial flutter, HTN and HLD, planning of further management, discussion with patient and family and  coordination of care    Venancio Poisson, [facility]  [facility] [address] [place], [place] [address]  Phone [phone] Fax [phone]

## 2018-06-23 ENCOUNTER — Encounter: Payer: Self-pay | Admitting: Adult Health

## 2018-06-28 ENCOUNTER — Telehealth: Payer: Self-pay | Admitting: *Deleted

## 2018-06-28 NOTE — Telephone Encounter (Signed)
Mrs Prowell's daughter called and said that it has been two weeks since starting the sertraline and they have seen no difference in the crying, and in fact, her mother has become more aggressive and angry.  Please advise.

## 2018-06-28 NOTE — Telephone Encounter (Signed)
Increase sertraline to 50 mg/day #30,1 refill

## 2018-06-29 MED ORDER — SERTRALINE HCL 50 MG PO TABS: 50.0000 mg | ORAL_TABLET | Freq: Every day | ORAL | 1 refills | Status: DC

## 2018-06-29 NOTE — Telephone Encounter (Signed)
New order sent to pharmacy and Mrs Morison's daughter notified.

## 2018-07-01 ENCOUNTER — Encounter: Payer: Self-pay | Admitting: Nurse Practitioner

## 2018-07-01 ENCOUNTER — Ambulatory Visit (INDEPENDENT_AMBULATORY_CARE_PROVIDER_SITE_OTHER): Payer: Medicare Other | Admitting: Nurse Practitioner

## 2018-07-01 VITALS — BP 136/84 | HR 47 | Temp 97.5°F | Ht 63.0 in | Wt 129.0 lb

## 2018-07-01 DIAGNOSIS — I63412 Cerebral infarction due to embolism of left middle cerebral artery: Secondary | ICD-10-CM

## 2018-07-01 DIAGNOSIS — I69398 Other sequelae of cerebral infarction: Secondary | ICD-10-CM | POA: Diagnosis not present

## 2018-07-01 DIAGNOSIS — I4891 Unspecified atrial fibrillation: Secondary | ICD-10-CM

## 2018-07-01 DIAGNOSIS — I10 Essential (primary) hypertension: Secondary | ICD-10-CM | POA: Diagnosis not present

## 2018-07-01 DIAGNOSIS — Z6830 Body mass index (BMI) 30.0-30.9, adult: Secondary | ICD-10-CM | POA: Diagnosis not present

## 2018-07-01 DIAGNOSIS — R4586 Emotional lability: Secondary | ICD-10-CM | POA: Diagnosis not present

## 2018-07-01 DIAGNOSIS — R269 Unspecified abnormalities of gait and mobility: Secondary | ICD-10-CM | POA: Diagnosis not present

## 2018-07-01 DIAGNOSIS — I6930 Unspecified sequelae of cerebral infarction: Secondary | ICD-10-CM

## 2018-07-01 DIAGNOSIS — I69351 Hemiplegia and hemiparesis following cerebral infarction affecting right dominant side: Secondary | ICD-10-CM

## 2018-07-01 DIAGNOSIS — I63512 Cerebral infarction due to unspecified occlusion or stenosis of left middle cerebral artery: Secondary | ICD-10-CM

## 2018-07-01 DIAGNOSIS — K219 Gastro-esophageal reflux disease without esophagitis: Secondary | ICD-10-CM | POA: Diagnosis not present

## 2018-07-01 DIAGNOSIS — I5032 Chronic diastolic (congestive) heart failure: Secondary | ICD-10-CM | POA: Diagnosis not present

## 2018-07-01 DIAGNOSIS — E785 Hyperlipidemia, unspecified: Secondary | ICD-10-CM | POA: Diagnosis not present

## 2018-07-01 DIAGNOSIS — R001 Bradycardia, unspecified: Secondary | ICD-10-CM

## 2018-07-01 MED ORDER — SERTRALINE HCL 100 MG PO TABS: 100.0000 mg | ORAL_TABLET | Freq: Every day | ORAL | 5 refills | Status: DC

## 2018-07-01 MED ORDER — APIXABAN 5 MG PO TABS: 5.0000 mg | ORAL_TABLET | Freq: Two times a day (BID) | ORAL | 1 refills | Status: DC

## 2018-07-01 MED ORDER — SPIRONOLACTONE 25 MG PO TABS: 25.0000 mg | ORAL_TABLET | Freq: Every day | ORAL | 3 refills | Status: DC

## 2018-07-01 MED ORDER — ATORVASTATIN CALCIUM 40 MG PO TABS: 40.0000 mg | ORAL_TABLET | Freq: Every day | ORAL | 1 refills | Status: DC

## 2018-07-01 NOTE — Addendum Note (Signed)
Addended by: Rolena Infante on: 07/01/2018 04:49 PM   Modules accepted: Orders

## 2018-07-01 NOTE — Addendum Note (Signed)
Addended by: [name] on: 07/01/2018 04:47 PM   Modules accepted: Orders

## 2018-07-01 NOTE — Patient Instructions (Signed)

## 2018-07-01 NOTE — Progress Notes (Signed)
Subjective:    Patient ID: Brooke Chavez, female    DOB: 1940/09/09, 77 y.o.   MRN: [id number]   Chief Complaint: Medical Management of Chronic Issues   HPI: Patient brought in by daughter to discuss her meds. She has had a real bad year. She was doing well until she had heart surgery. Several days after heart surgery she had a stroke and has been having problems every since. She lives with her daughter now who is her primary provider. She has improved some and is now able to walk short distances with a cane. Her family has several concerns to discuss. Her heart rate has been slow  - is she alwaysgoing to have to take eliquis 2x a day- yes - is aggression and anger common after a stroke- yes - she is on zoloft for depression but they really see no differece- should she stay on it.- may nee dto increase zoloft to 100mg  daily  1. Chronic diastolic congestive heart failure ([place])   2. Acute ischemic left MCA stroke ([place])   3. Cerebrovascular accident (CVA) due to embolism of left middle cerebral artery ([place])   4. Benign essential HTN   5. Atrial fibrillation, unspecified type ([place])   6. Gastroesophageal reflux disease without esophagitis   7. Hemiparesis of right dominant side as late effect of cerebral infarction ([place])   8. Gait disturbance, post-stroke   9. BMI 30.0-30.9,adult     Outpatient Encounter Medications as of 07/01/2018  Medication Sig  . acetaminophen (TYLENOL) 325 MG tablet Take 2 tablets (650 mg total) by mouth every 6 (six) hours as needed for mild pain (or Fever >/= 101).  Marland Kitchen apixaban (ELIQUIS) 5 MG TABS tablet Take 1 tablet (5 mg total) by mouth 2 (two) times daily.  Marland Kitchen atorvastatin (LIPITOR) 40 MG tablet Take 1 tablet (40 mg total) by mouth at bedtime.  . magnesium oxide (MAG-OX) 400 MG tablet Take 1 tablet (400 mg total) by mouth daily.  . sertraline (ZOLOFT) 50 MG tablet Take 1 tablet (50 mg total) by mouth daily.  Marland Kitchen spironolactone (ALDACTONE) 25 MG tablet Take 1  tablet (25 mg total) by mouth daily.       Social history: Lives with her daughter  Review of Systems  Constitutional: Negative for activity change and appetite change.  HENT: Negative.   Eyes: Negative for pain.  Respiratory: Negative for shortness of breath.   Cardiovascular: Negative for chest pain, palpitations and leg swelling.  Gastrointestinal: Negative for abdominal pain.  Endocrine: Negative for polydipsia.  Genitourinary: Negative.   Skin: Negative for rash.  Neurological: Negative for dizziness, weakness and headaches.  Hematological: Does not bruise/bleed easily.  Psychiatric/Behavioral: Negative.   All other systems reviewed and are negative.      Objective:   Physical Exam  Constitutional: She is oriented to person, place, and time. She appears well-developed and well-nourished. No distress.  HENT:  Head: Normocephalic.  Nose: Nose normal.  Mouth/Throat: Oropharynx is clear and moist.  Eyes: Pupils are equal, round, and reactive to light. EOM are normal.  Neck: Normal range of motion. Neck supple. No JVD present. Carotid bruit is not present.  Cardiovascular: Normal rate, regular rhythm, normal heart sounds and intact distal pulses.  Pulmonary/Chest: Effort normal and breath sounds normal. No respiratory distress. She has no wheezes. She has no rales. She exhibits no tenderness.  Abdominal: Soft. Normal appearance, normal aorta and bowel sounds are normal. She exhibits no distension, no abdominal bruit, no pulsatile midline  mass and no mass. There is no splenomegaly or hepatomegaly. There is no tenderness.  Musculoskeletal: Normal range of motion. She exhibits no edema.  Walking with cane due to right sided ext weaking  Lymphadenopathy:    She has no cervical adenopathy.  Neurological: She is alert and oriented to person, place, and time. She has normal reflexes.  Skin: Skin is warm and dry.  Psychiatric: She has a normal mood and affect. Her behavior is  normal. Judgment and thought content normal.  Speech is slow at times  Nursing note and vitals reviewed.  BP 136/84   Pulse (!) 47   Temp (!) 97.5 F (36.4 C) (Oral)   Ht 5\' 3"  (1.6 m)   Wt 129 lb (58.5 kg)   LMP 11/05/1992   SpO2 99%   BMI 22.85 kg/m       Assessment & Plan:    [name] comes in today with chief complaint of Medical Management of Chronic Issues   Diagnosis and orders addressed:  1. Chronic diastolic congestive heart failure ([place])  2. Acute ischemic left MCA stroke ([place])  3. Cerebrovascular accident (CVA) due to embolism of left middle cerebral artery ([facility]) - apixaban (ELIQUIS) 5 MG TABS tablet; Take 1 tablet (5 mg total) by mouth 2 (two) times daily.  Dispense: 60 tablet; Refill: 1  4. Benign essential HTN  5. Atrial fibrillation, unspecified type ([place])  6. Gastroesophageal reflux disease without esophagitis  7. Hemiparesis of right dominant side as late effect of cerebral infarction ([place])  8. Gait disturbance, post-stroke  9. BMI 30.0-30.9,adult  10. Emotional lability - sertraline (ZOLOFT) 100 MG tablet; Take 1 tablet (100 mg total) by mouth daily.  Dispense: 30 tablet; Refill: 5  11. Bradycardia  12. Hyperlipidemia with target LDL less than 100 - atorvastatin (LIPITOR) 40 MG tablet; Take 1 tablet (40 mg total) by mouth at bedtime.  Dispense: 90 tablet; Refill: 1  13. Late effect of cerebrovascular accident (CVA) - spironolactone (ALDACTONE) 25 MG tablet; Take 1 tablet (25 mg total) by mouth daily.  Dispense: 90 tablet; Refill: 3  We increased her zoloft to 100mg  daily Keep follow up with cardiology- mainly to discuss heart rate Labs pending Health Maintenance reviewed Diet and exercise encouraged  Follow up plan: 3 months   [place], FNP

## 2018-07-02 LAB — LIPID PANEL
Chol/HDL Ratio: 3 ratio (ref 0.0–4.4)
Cholesterol, Total: 177 mg/dL (ref 100–199)
HDL: 60 mg/dL (ref >39)
LDL Calculated: 100 mg/dL — ABNORMAL HIGH (ref 0–99)
Triglycerides: 87 mg/dL (ref 0–149)
VLDL Cholesterol Cal: 17 mg/dL (ref 5–40)

## 2018-07-02 LAB — CMP14+EGFR
ALT: 14 IU/L (ref 0–32)
AST: 20 IU/L (ref 0–40)
Albumin/Globulin Ratio: 1.8 (ref 1.2–2.2)
Albumin: 4.2 g/dL (ref 3.5–4.8)
Alkaline Phosphatase: 85 IU/L (ref 39–117)
BUN/Creatinine Ratio: 10 — ABNORMAL LOW (ref 12–28)
BUN: 14 mg/dL (ref 8–27)
Bilirubin Total: 0.7 mg/dL (ref 0.0–1.2)
CO2: 23 mmol/L (ref 20–29)
Calcium: 9.9 mg/dL (ref 8.7–10.3)
Chloride: 100 mmol/L (ref 96–106)
Creatinine, Ser: 1.39 mg/dL — ABNORMAL HIGH (ref 0.57–1.00)
GFR calc Af Amer: 42 mL/min/{1.73_m2} — ABNORMAL LOW (ref >59)
GFR calc non Af Amer: 37 mL/min/{1.73_m2} — ABNORMAL LOW (ref >59)
Globulin, Total: 2.4 g/dL (ref 1.5–4.5)
Glucose: 88 mg/dL (ref 65–99)
Potassium: 4.7 mmol/L (ref 3.5–5.2)
Sodium: 140 mmol/L (ref 134–144)
Total Protein: 6.6 g/dL (ref 6.0–8.5)

## 2018-07-07 ENCOUNTER — Ambulatory Visit (INDEPENDENT_AMBULATORY_CARE_PROVIDER_SITE_OTHER): Payer: Medicare Other | Admitting: Adult Health

## 2018-07-07 ENCOUNTER — Encounter: Payer: Self-pay | Admitting: Adult Health

## 2018-07-07 VITALS — BP 135/67 | HR 48 | Wt 130.4 lb

## 2018-07-07 DIAGNOSIS — I69351 Hemiplegia and hemiparesis following cerebral infarction affecting right dominant side: Secondary | ICD-10-CM | POA: Diagnosis not present

## 2018-07-07 DIAGNOSIS — I480 Paroxysmal atrial fibrillation: Secondary | ICD-10-CM | POA: Diagnosis not present

## 2018-07-07 DIAGNOSIS — E785 Hyperlipidemia, unspecified: Secondary | ICD-10-CM

## 2018-07-07 DIAGNOSIS — I63512 Cerebral infarction due to unspecified occlusion or stenosis of left middle cerebral artery: Secondary | ICD-10-CM | POA: Diagnosis not present

## 2018-07-07 DIAGNOSIS — I639 Cerebral infarction, unspecified: Secondary | ICD-10-CM | POA: Diagnosis not present

## 2018-07-07 DIAGNOSIS — I10 Essential (primary) hypertension: Secondary | ICD-10-CM

## 2018-07-07 DIAGNOSIS — R4701 Aphasia: Secondary | ICD-10-CM | POA: Diagnosis not present

## 2018-07-07 NOTE — Progress Notes (Signed)
[name] [address] [place]. [place] [address] [phone]       OFFICE FOLLOW UP NOTE  [name] Date of Birth:  February 11, 1941 Medical Record Number:  [id number]   Reason for visit: Stroke follow up  CHIEF COMPLAINT:  Chief Complaint  Patient presents with  . 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 History obtained from patient and chart review. Reviewed all radiology images and labs personally.  [name] is a 77 y.o. female with history of atrial flutter, mitral regurgitation status post mitral valve repair 2 weeks ago, bradycardia, and hyperlipidemia who was found by family to have right sided weakness and aphasia the morning after going to bed and was brought into [facility] ED. She was in her normal state of health the night prior. Out of the window for tPA.  CT had reviewed and showed a large left MCA infarct and hyperdense left ICA and left MCA c/w thrombus.  Patient transferred to [name] for possible endovascular intervention for left M1 occlusion.  CTA head and neck showed left M1 occlusion with 8 mm thrombus and left MCA infarct 37 cc.  CT perfusion showed large penumbra 78 cc. High risk for IR due to pseudo normalization on CTP and increased risk of hemorrhage. Repeat CT showed evolving left MCA infarct along with dense left MCA and old left cerebellar infarct.  MRI showed large left MCA infarct with HD, 2 mm left to right shift and old left cerebellar infarct.  2D echo showed an EF of 50 to 55% without cardiac source of embolus and also negative for PFO.  LDL 75 and recommended to continue Lipitor 40 mg at discharge.  Blood pressure stable throughout admission and recommended long-term BP goal normotensive.  A1c satisfactory at 5.3.  Patient was on  warfarin prior to admission for atrial flutter with INR 1.79.  This was held due to high risk of hemorrhage during admission.  Patient was discharged to [facility] for continued need of therapies.  Patient was discharged home on 02/13/2018 and it was decided to resume Eliquis at that time for atrial flutter management instead of Coumadin as CT scans have been stable. Patient returned to [facility] ED on 02/20/2018 for bradycardia and was discharged home in stable condition with all negative testing.  Patient returned to [name] ED on 02/22/2018 for chest pain but again all negative testing.  Patient returned to [name] ED on 03/07/2018 due to patient grimacing with any type of movement since her fall in a few days prior.  X-rays were performed by PCP a couple days prior which showed degenerative changes and old compression fractures but no acute findings and was advised to take Tylenol as needed for pain and to continue following up with PCP.  Patient return to ED on 03/13/2018 due to increased pain.  CT showed T12 fracture without wedging or retropulsion and no further injury and it was determined that this was likely the source of her pain.  Patient was treated with lidocaine patch and was discharged back home in stable condition.  03/18/2018 visit: Patient is being seen today for hospital follow-up and is accompanied by her daughter.  She continues to have right hemiparesis along with global aphasia.  She is currently living with both her daughters and participating in home PT/OT/ST where she has been making some improvement.  She is currently sitting in wheelchair but is able to use rolling walker for short distances.  She has not had an additional fall since her fall back in July.  She has been this somewhat limited due to pain in her back from her fall.  Her speech has been improving some and daughter states that times she is able to say sentences where they can understand her but she has great difficulty when  answering questions and at times understanding what others are saying to her.  Her speech does get worse when she has increased fatigue, with increased anxiety or with excitement.  She continues to take Eliquis with mild bruising but no bleeding.  Continues to take Lipitor without side effects myalgias.  Blood pressure today mildly elevated at 141/79 and as this is monitored at home it is typically lower.  Daughter has found 2 small nodules on the right lower extremity and upon palpation feels as though these could be small cysts.  Patient does not complain of pain upon palpation nor they want to touch or observe redness and area.  Recommended to speak to PCP in regards to these just for continuation of monitoring or if she feels as though they are getting larger in size quickly, to be seen for this.  Denies new or worsening stroke/TIA symptoms.  07/07/2018 interval history: Patient is being seen today for scheduled follow-up visit and is accompanied by her daughter and sister.  She continues to have right hemiparesis with some improvement. She continues to have expressive aphasia. Tearful during appointment today due to frustrations.  Daughter that was present at appointment call daughter that was with her at prior appointment for updates and answering any questions.  She does believe that her mother has been stable as far as her agitation and aggression.  She does believe that this occurs daily but it does not last and they are able to redirect her.  Therapies have some stopped due to lack of participation.  Advised daughters and sister that it is difficult to tell whether she has any cognitive decline due to her global aphasia.  One daughter was questioning whether they should restart therapies at this time but due to global aphasia and possible cognitive decline, patient will most likely not be able to participate and will frustrate her further.  She continues on Eliquis without side effects of bleeding or  bruising.  Continues on atorvastatin without side effects myalgias.  Blood pressure today 135/67.  She does ambulate with a cane and denies any recent falls.  No further concerns at this time.  Denies new or worsening stroke/TIA symptoms.     ROS:   14 system review of systems performed and negative with exception of speech difficulty  PMH:  Past Medical History:  Diagnosis Date  . Atrial flutter ([place])    Typical by EKG diagnosis 9/11 s/p CIT ablation 11/11  . Bradycardia   . Chronic diastolic congestive heart failure ([place])   . 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 DJD (degenerative joint disease)   . Dysrhythmia   . Femur fracture, left ([place]) 2013  . Global aphasia   . Hyperlipidemia    x5 years  . Hypertension    Since 1997  . 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 Liver masses 11/13/2017   Multiple small nodules seen on CT and MRI  . Mitral regurgitation    severe  . 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 Stroke ([place])   . TR (tricuspid regurgitation)    Mild with RA enlargment    PSH:  Past Surgical History:  Procedure Laterality Date  . A FLUTTER ABLATION N/A 06/27/2011   Procedure: ABLATION A FLUTTER;  Surgeon: Thompson Grayer, MD;  Location: [facility] CATH LAB;  Service: Cardiovascular;  Laterality: N/A;  . ATRIAL ABLATION SURGERY  06/2010  . HIP SURGERY     Left (fracture) 3/13  . JOINT REPLACEMENT     both knees   . KNEE ARTHROSCOPY     Right  . LUMBAR SPINE SURGERY    . MITRAL VALVE REPAIR Right 01/07/2018   Procedure: MINIMALLY INVASIVE MITRAL VALVE REPAIR using LivaNova ring size 28 MM;  Surgeon: Rexene Alberts, MD;  Location: [place];  Service: Open Heart Surgery;   Laterality: Right;  . PATENT FORAMEN OVALE(PFO) CLOSURE N/A 01/07/2018   Procedure: PATENT FORAMEN OVALE (PFO) CLOSURE;  Surgeon: Rexene Alberts, MD;  Location: [place];  Service: Open Heart Surgery;  Laterality: N/A;  . RIGHT/LEFT HEART CATH AND CORONARY ANGIOGRAPHY N/A 11/11/2017   Procedure: RIGHT/LEFT HEART CATH AND CORONARY ANGIOGRAPHY;  Surgeon: Sherren Mocha, MD;  Location: [place] [facility] [place] CV LAB;  Service: Cardiovascular;  Laterality: N/A;  . TEE WITHOUT CARDIOVERSION N/A 09/18/2017   Procedure: TRANSESOPHAGEAL ECHOCARDIOGRAM (TEE);  Surgeon: Skeet Latch, MD;  Location: [place];  Service: Cardiovascular;  Laterality: N/A;  . TEE WITHOUT CARDIOVERSION N/A 01/07/2018   Procedure: TRANSESOPHAGEAL ECHOCARDIOGRAM (TEE);  Surgeon: Rexene Alberts, MD;  Location: [place];  Service: Open Heart Surgery;  Laterality: N/A;  . TOTAL KNEE ARTHROPLASTY     Left  . TUBAL LIGATION      Social History:  Social History   Socioeconomic History  . Marital status: Widowed    Spouse name: Not on file  . Number of children: 3  . Years of education: 76  . Highest education level: Not on file  Occupational History    Comment: na  Social Needs  . Financial resource strain: Not on file  . Food insecurity:    Worry: Not on file    Inability: Not on file  . Transportation needs:    Medical: Not on file    Non-medical: Not on file  Tobacco Use  . Smoking status: Never Smoker  . Smokeless tobacco: Never Used  Substance and Sexual Activity  . Alcohol use: No  . Drug use: No  . Sexual activity: Never  Lifestyle  . Physical activity:    Days per week: Not on file    Minutes per session: Not on file  . Stress: Not on file  Relationships  . Social connections:    Talks on phone: Not on file    Gets together: Not on file    Attends religious service: Not on file    Active member of club or organization: Not on file    Attends meetings of clubs or organizations: Not on file    Relationship  status: Not on file  . Intimate partner violence:    Fear of current or ex partner: Not on file  Emotionally abused: Not on file    Physically abused: Not on file    Forced sexual activity: Not on file  Other Topics Concern  . Not on file  Social History Narrative   Lives in [place]   Her daughter lives with her   Little caffeine    Family History:  Family History  Problem Relation Age of Onset  . Hypertension Mother   . Cancer Sister        leukemia  . Diabetes Brother   . Stroke Sister   . Diabetes Brother   . Heart attack Brother   . Healthy Daughter   . Healthy Son   . Healthy Daughter     Medications:   Current Outpatient Medications on File Prior to Visit  Medication Sig Dispense Refill  . acetaminophen (TYLENOL) 325 MG tablet Take 2 tablets (650 mg total) by mouth every 6 (six) hours as needed for mild pain (or Fever >/= 101).    Marland Kitchen apixaban (ELIQUIS) 5 MG TABS tablet Take 1 tablet (5 mg total) by mouth 2 (two) times daily. 60 tablet 1  . atorvastatin (LIPITOR) 40 MG tablet Take 1 tablet (40 mg total) by mouth at bedtime. 90 tablet 1  . magnesium oxide (MAG-OX) 400 MG tablet Take 1 tablet (400 mg total) by mouth daily. 30 tablet 1  . sertraline (ZOLOFT) 100 MG tablet Take 1 tablet (100 mg total) by mouth daily. 30 tablet 5  . spironolactone (ALDACTONE) 25 MG tablet Take 1 tablet (25 mg total) by mouth daily. 90 tablet 3   No current facility-administered medications on file prior to visit.     Allergies:   Allergies  Allergen Reactions  . Oxycodone-Acetaminophen Anxiety and Other (See Comments)    Hallucinations  . Aspirin Nausea And Vomiting and Nausea Only    Other reaction(s): GI Upset (intolerance)     Physical Exam  Vitals:   07/07/18 1517  BP: 135/67  Pulse: (!) 48  Weight: 130 lb 6.4 oz (59.1 kg)   Body mass index is 23.1 kg/m. No exam data present  General: well developed, well nourished, seated, in no evident distress Head: head  normocephalic and atraumatic.   Neck: supple with no carotid or supraclavicular bruits Cardiovascular: regular rate and rhythm, no murmurs Musculoskeletal: no deformity Skin:  no rash/petichiae Vascular:  Normal pulses all extremities  Neurologic Exam Mental Status: Awake and fully alert. Mood and affect appropriate.  Global aphasia.  Unable to assess orientation level due to global aphasia. Cranial Nerves: Fundoscopic exam deferred. Pupils equal, briskly reactive to light. Extraocular movements full without nystagmus. Visual fields full to confrontation. Hearing intact.  Mild right facial droop present Motor: Normal bulk and tone.  RUE: 4+/5 - 5/5, RLE: 4+/5; LUE: 5/5, LLE: 5/5 Sensory.: intact to touch , pinprick , position and vibratory sensation.  Coordination: Rapid alternating movements normal in all extremities. Finger-to-nose and heel-to-shin performed accurately bilaterally. Gait and Station: Patient is able to ambulate with use of cane Reflexes: 1+ and symmetric. Toes downgoing.      Diagnostic Data (Labs, Imaging, Testing)  CT head without contrast 01/24/2018 IMPRESSION: 1. Large territory acute left MCA infarct without hemorrhage. Hyperdense left internal carotid artery and left MCA compatible with acute thrombosis. 2. ASPECTS is 4  CT angio head with and without contrast CT angio neck with and without contrast CT cerebral perfusion with contrast 01/24/2018 IMPRESSION: 1. Left proximal M1 occlusion with thrombus measuring approximately 8 mm in length. Intermediate collaterals. 2.  Left MCA distribution infarction with perfusion CT calculated core of 37 cc corresponding to the infarct on noncontrast CT. The calculated core may slightly underestimate the actual core as the region of infarct visible on noncontrast CT (ASPECTS 4) may have areas of pseudonormalization. 3. Large perfusion penumbra, 78 cc. 4. Patent carotid and vertebral arteries in the neck. No  significant stenosis by NASCET criteria, dissection, or aneurysm. 5. Patent right MCA, bilateral ACA, and bilateral PCA distributions.  MRI brain without contrast 01/25/2018 IMPRESSION: 1. 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 2 mm LEFT-to-RIGHT midline shift. 2. Susceptibility artifact seen with old PRES, chronic hypertension, less likely amyloid angiopathy. 3. Old LEFT cerebellar infarct.  CT head without contrast (repeat) 01/31/2018 IMPRESSION: 1. Continued interval evolution of subacute left MCA territory infarct with evidence for hemorrhagic transformation. Mildly decreased edema and regional mass effect as compared to previous. 2. No other new intracranial abnormality.     ASSESSMENT: Brooke Chavez is a 77 y.o. year old female here with left MCA infarct on 01/24/2018 secondary to left M1 occlusion likely secondary to atrial flutter with subtherapeutic INR post MV repair 2 weeks ago. Vascular risk factors include atrial flutter, HTN and HLD.  Patient is being seen today for 56-month follow-up visit and does continue to have mild right hemiparesis with continued global aphasia.    PLAN: -HTN: Continue current medication regimen continue follow-up with PCP and monitoring BP levels at home -HLD: Continue atorvastatin for secondary stroke prevention with continuing monitoring and prescribing from PCP -Atrial flutter: Continue Eliquis for management and secondary stroke prevention along with continued follow-up by cardiology for Eliquis prescribing and management -Advised daughters to ensure they continue to encourage daily activity to prevent deconditioning -Advised daughters that if patient gets to a point that they believe she may be able to participate in therapies, to notify office in order will be placed -Maintain strict control of hypertension with blood pressure goal below 130/90, diabetes with hemoglobin A1c goal below 6.5% and cholesterol with LDL  cholesterol (bad cholesterol) goal below 70 mg/dL. I also advised the patient to eat a healthy diet with plenty of whole grains, cereals, fruits and vegetables, exercise regularly and maintain ideal body weight.  Follow up in 6 months or call earlier if needed   Greater than 50% of time during this 25 minute visit was spent on counseling,explanation of diagnosis of left MCA infarct with hemorrhagic transformation, reviewing risk factor management of atrial flutter, HTN and HLD, planning of further management, discussion with patient and family and coordination of care    Venancio Poisson, [facility]  [facility] [address] [place], [place] [address]  Phone [phone] Fax [phone]

## 2018-07-07 NOTE — Patient Instructions (Signed)
Continue Eliquis (apixaban) daily  and lipitor  for secondary stroke prevention  Continue to follow up with PCP regarding cholesteorl and blood pressure management   Continue to follow with cardiologist regarding atrial fibrillation and eliqluis management   Please call office if you would like to restart therapies if you believe patient will be able to participate - in the mean time, encourage activity during the day with exercising as tolerated  Continue to monitor blood pressure at home  Maintain strict control of hypertension with blood pressure goal below 130/90, diabetes with hemoglobin A1c goal below 6.5% and cholesterol with LDL cholesterol (bad cholesterol) goal below 70 mg/dL. I also advised the patient to eat a healthy diet with plenty of whole grains, cereals, fruits and vegetables, exercise regularly and maintain ideal body weight.  Followup in the future with me in 6 months or call earlier if needed       Thank you for coming to see [place] at [facility]. I hope we have been able to provide you high quality care today.  You may receive a patient satisfaction survey over the next few weeks. We would appreciate your feedback and comments so that we may continue to improve ourselves and the health of our patients.

## 2018-07-08 NOTE — Progress Notes (Signed)
I agree with the above plan 

## 2018-07-14 ENCOUNTER — Telehealth: Payer: Self-pay | Admitting: Nurse Practitioner

## 2018-07-15 NOTE — Telephone Encounter (Signed)
Spoke with pt's daughter and advised her of provider feedback and she voiced understanding.

## 2018-07-15 NOTE — Telephone Encounter (Signed)
She can try over the counter pericolace, which is a stimulant laxative. If she does not have a BM 12 hours after taking the Pericolace, she will need to be seen.

## 2018-07-15 NOTE — Telephone Encounter (Signed)
Spoke with pt's daughter and she states pt hasn't had BM since a week ago and has been taking Miralax since Saturday daily without passing any stool. Per pt's daughter she isn't c/o stomach pain, denies bloating. Is there anything else they can do or does she ntbs.

## 2018-07-19 ENCOUNTER — Encounter: Payer: Self-pay | Admitting: Physical Medicine & Rehabilitation

## 2018-07-19 ENCOUNTER — Encounter: Payer: Medicare Other | Attending: Registered Nurse

## 2018-07-19 ENCOUNTER — Ambulatory Visit (HOSPITAL_BASED_OUTPATIENT_CLINIC_OR_DEPARTMENT_OTHER): Payer: Medicare Other | Admitting: Physical Medicine & Rehabilitation

## 2018-07-19 VITALS — BP 134/69 | HR 50 | Ht 62.0 in | Wt 127.0 lb

## 2018-07-19 DIAGNOSIS — Z96653 Presence of artificial knee joint, bilateral: Secondary | ICD-10-CM | POA: Insufficient documentation

## 2018-07-19 DIAGNOSIS — I63512 Cerebral infarction due to unspecified occlusion or stenosis of left middle cerebral artery: Secondary | ICD-10-CM | POA: Insufficient documentation

## 2018-07-19 DIAGNOSIS — Z7901 Long term (current) use of anticoagulants: Secondary | ICD-10-CM | POA: Insufficient documentation

## 2018-07-19 DIAGNOSIS — I639 Cerebral infarction, unspecified: Secondary | ICD-10-CM | POA: Insufficient documentation

## 2018-07-19 DIAGNOSIS — I5032 Chronic diastolic (congestive) heart failure: Secondary | ICD-10-CM | POA: Insufficient documentation

## 2018-07-19 DIAGNOSIS — I69320 Aphasia following cerebral infarction: Secondary | ICD-10-CM

## 2018-07-19 DIAGNOSIS — Z79899 Other long term (current) drug therapy: Secondary | ICD-10-CM | POA: Diagnosis not present

## 2018-07-19 DIAGNOSIS — E785 Hyperlipidemia, unspecified: Secondary | ICD-10-CM | POA: Diagnosis not present

## 2018-07-19 DIAGNOSIS — Z8249 Family history of ischemic heart disease and other diseases of the circulatory system: Secondary | ICD-10-CM | POA: Insufficient documentation

## 2018-07-19 DIAGNOSIS — I69351 Hemiplegia and hemiparesis following cerebral infarction affecting right dominant side: Secondary | ICD-10-CM | POA: Insufficient documentation

## 2018-07-19 DIAGNOSIS — I110 Hypertensive heart disease with heart failure: Secondary | ICD-10-CM | POA: Insufficient documentation

## 2018-07-19 DIAGNOSIS — R4701 Aphasia: Secondary | ICD-10-CM | POA: Diagnosis not present

## 2018-07-19 DIAGNOSIS — F482 Pseudobulbar affect: Secondary | ICD-10-CM

## 2018-07-19 NOTE — Progress Notes (Signed)
Subjective:    Patient ID: Brooke Chavez, female    DOB: August 16, 1940, 77 y.o.   MRN: [id number] 77 year old right-handed female with complex medical history with mitral valve disease, atrial flutter status post mitral valve repair 01/07/2018 per Dr. Roxy Manns.  She was discharged on Coumadin 01/14/2018 as well as history of  hypertension, chronic diastolic congestive heart failure.  She presented to [facility] on 01/24/2018 after being found down by family with right-sided weakness and aphasia.  She lives with her daughter.  Reported to be independent prior to  mitral valve repair.  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  She was discharged to [facility] for ongoing care.   INR upon admission of 1.7.  CT angiogram of head and neck showed left proximal M1 occlusion with thrombus measuring 8 mm in length.  Interventional radiology consulted.  No plan for intervention due to high risk of bleeding.  Echocardiogram with  ejection fraction of 55%.  Systolic function normal.  No PFO.  MRI of the brain showed large left MCA territory infarction with petechial hemorrhage, old left cerebellar infarction.  Cardiology service is consulted.  No plan for TEE. HPI  Still emotional, but has not gotten aggressive as prior to starting sertraline.  Initial dose of 25 mg was increased to 50 mg.  Primary care recommended increasing to 100 mg however family has not done this. Despite this the patient still cries frequently about nothing in particular.. Finisihed PT, OT, SLP Ambulates with a cane and hand-held assist. Patient does read, she visits with family but her aphasia is still affecting her expressive language QT 466ms Pain Inventory Average Pain 3 Pain Right Now 5 My pain is na  In the last 24 hours, has pain interfered with the following? General activity  na Relation with others na Enjoyment of life na What TIME of day is your pain at its worst? na Sleep (in general) Good  Pain is worse with: na Pain improves with: na Relief from Meds: 0  Mobility walk with assistance ability to climb steps?  no do you drive?  no  Function retired  Neuro/Psych No problems in this area  Prior Studies Any changes since last visit?  no  Physicians involved in your care Any changes since last visit?  no   Family History  Problem Relation Age of Onset  . Hypertension Mother   . Cancer Sister        leukemia  . Diabetes Brother   . Stroke Sister   . Diabetes Brother   . Heart attack Brother   . Healthy Daughter   . Healthy Son   . Healthy Daughter    Social History   Socioeconomic History  . Marital status: Widowed    Spouse name: Not on file  . Number of children: 3  . Years of education: 60  . Highest education level: Not on file  Occupational History    Comment: na  Social Needs  . Financial resource strain: Not on file  . Food insecurity:    Worry: Not on file    Inability: Not on file  . Transportation needs:    Medical: Not on file    Non-medical: Not on file  Tobacco Use  . Smoking status: Never Smoker  . Smokeless tobacco: Never Used  Substance and Sexual Activity  . Alcohol use: No  .  Drug use: No  . Sexual activity: Never  Lifestyle  . Physical activity:    Days per week: Not on file    Minutes per session: Not on file  . Stress: Not on file  Relationships  . Social connections:    Talks on phone: Not on file    Gets together: Not on file    Attends religious service: Not on file    Active member of club or organization: Not on file    Attends meetings of clubs or organizations: Not on file    Relationship status: Not on file  Other Topics Concern  . Not on file  Social History Narrative   Lives in [place]   Her daughter lives with her   Little caffeine   Past Surgical History:  Procedure  Laterality Date  . A FLUTTER ABLATION N/A 06/27/2011   Procedure: ABLATION A FLUTTER;  Surgeon: Thompson Grayer, MD;  Location: [facility] CATH LAB;  Service: Cardiovascular;  Laterality: N/A;  . ATRIAL ABLATION SURGERY  06/2010  . HIP SURGERY     Left (fracture) 3/13  . JOINT REPLACEMENT     both knees   . KNEE ARTHROSCOPY     Right  . LUMBAR SPINE SURGERY    . MITRAL VALVE REPAIR Right 01/07/2018   Procedure: MINIMALLY INVASIVE MITRAL VALVE REPAIR using LivaNova ring size 28 MM;  Surgeon: Rexene Alberts, MD;  Location: [place];  Service: Open Heart Surgery;  Laterality: Right;  . PATENT FORAMEN OVALE(PFO) CLOSURE N/A 01/07/2018   Procedure: PATENT FORAMEN OVALE (PFO) CLOSURE;  Surgeon: Rexene Alberts, MD;  Location: [place];  Service: Open Heart Surgery;  Laterality: N/A;  . RIGHT/LEFT HEART CATH AND CORONARY ANGIOGRAPHY N/A 11/11/2017   Procedure: RIGHT/LEFT HEART CATH AND CORONARY ANGIOGRAPHY;  Surgeon: Sherren Mocha, MD;  Location: [place] CV LAB;  Service: Cardiovascular;  Laterality: N/A;  . TEE WITHOUT CARDIOVERSION N/A 09/18/2017   Procedure: TRANSESOPHAGEAL ECHOCARDIOGRAM (TEE);  Surgeon: Skeet Latch, MD;  Location: [place];  Service: Cardiovascular;  Laterality: N/A;  . TEE WITHOUT CARDIOVERSION N/A 01/07/2018   Procedure: TRANSESOPHAGEAL ECHOCARDIOGRAM (TEE);  Surgeon: Rexene Alberts, MD;  Location: [place];  Service: Open Heart Surgery;  Laterality: N/A;  . TOTAL KNEE ARTHROPLASTY     Left  . TUBAL LIGATION     Past Medical History:  Diagnosis Date  . Atrial flutter ([place])    Typical by EKG diagnosis 9/11 s/p CIT ablation 11/11  . Bradycardia   . Chronic diastolic congestive heart failure ([place])   . 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 DJD (degenerative joint disease)   . Dysrhythmia   . Femur fracture, left ([place]) 2013  . Global aphasia   . Hyperlipidemia    x5 years  . Hypertension    Since 1997  . 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 Liver masses 11/13/2017   Multiple small nodules seen on CT and MRI  . Mitral regurgitation    severe  . 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 Stroke ([place])   . TR (tricuspid regurgitation)    Mild with RA enlargment   Ht 5\' 2"  (1.575 m)   Wt 127 lb (57.6  kg)   LMP 11/05/1992   BMI 23.23 kg/m   Opioid Risk Score:   Fall Risk Score:  `1  Depression screen PHQ 2/9  Depression screen [facility] 2/9 06/21/2018 03/05/2018 03/02/2018 11/03/2017 05/19/2017 04/28/2017 09/09/2016  Decreased Interest 0 0 2 0 0 0 0  Down, Depressed, Hopeless 0 0 2 0 0 0 0  PHQ - 2 Score 0 0 4 0 0 0 0  Altered sleeping - 0 3 - - - -  Tired, decreased energy - 0 3 - - - -  Change in appetite - 0 2 - - - -  Feeling bad or failure about yourself  - 0 1 - - - -  Trouble concentrating - 0 1 - - - -  Moving slowly or fidgety/restless - 0 2 - - - -  Suicidal thoughts - 0 0 - - - -  PHQ-9 Score - 0 16 - - - -  Some recent data might be hidden     Review of Systems  Constitutional: Negative.   HENT: Negative.   Eyes: Negative.   Respiratory: Negative.   Cardiovascular: Negative.   Gastrointestinal: Negative.   Endocrine: Negative.   Genitourinary: Negative.   Musculoskeletal: Positive for arthralgias.  Skin: Negative.   Allergic/Immunologic: Negative.   Neurological: Negative.   Hematological: Negative.   Psychiatric/Behavioral: Positive for confusion and dysphoric mood.  All other systems reviewed and are negative.      Objective:   Physical Exam  Constitutional: She is oriented to person, place, and time. She appears well-developed and well-nourished. No distress.  HENT:  Head: Normocephalic and atraumatic.  Eyes: Pupils are equal,  round, and reactive to light. EOM are normal.  Neck: Normal range of motion. Neck supple.  Musculoskeletal:  No pain with upper extremity or lower extremity range of motion She has mildly diminished internal and external rotation of the shoulders bilaterally.  Neurological: She is alert and oriented to person, place, and time.  Skin: Skin is warm and dry. She is not diaphoretic.  Psychiatric: She has a normal mood and affect.  Nursing note and vitals reviewed.  Motor strength is 4/5 in the right deltoid, bicep, tricep, grip, 5/5 bilateral hip flexor knee extensor ankle dorsiflexor 5/5 in left deltoid bicep tricep grip Sensation is reported as different in the right hand compared to the left hand. She ambulates with hand-held assist and her straight cane, no evidence of toe drag or knee instability The patient does exhibit some apraxia in the right upper extremity with manual muscle testing and requires some visual cues Verbal output is sparse she is able to respond to questions with one-word answers There is a mild dysarthria.     Assessment & Plan:  #1.  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 2.  Pseudobulbar affect post stroke, partial improvement with Zoloft but she was constantly crying throughout the examination.  Will trial Nuedexta 20/10 1 tablet daily for 1 week and then increase to 1 tablet every 12 hours.  10-day sample pack was given to the patient and her daughter they have been instructed to stop the Zoloft while she is on this medication.  If it works better than the Zoloft they are to call  this office to get a prescription and if not they may resume Zoloft at 50 mg

## 2018-07-19 NOTE — Patient Instructions (Addendum)
Please stop Zoloft while taking Nuedexta  Call for a presription if the Nuedexta is helping  Restart Zoloft if Nuedexta not helpful  Dextromethorphan; Quinidine oral capsules What is this medicine? DEXTROMETHORPHAN; QUINIDINE (dex troe meth OR fan; KWIN i deen) is a combination of two medicines used to treat pseudobulbar affect (PBA), a condition that causes uncontrollable, sudden, and frequent episodes of laughing and/or crying. This medicine may be used for other purposes; ask your health care provider or pharmacist if you have questions. COMMON BRAND NAME(S): Nuedexta What should I tell my health care provider before I take this medicine? They need to know if you have any of these conditions: -heart disease -history of low blood counts caused by a medicine -history of irregular heartbeat -kidney disease -liver disease -low levels of magnesium or potassium in the blood -myasthenia gravis -an unusual or allergic reaction to dextromethorphan, quinidine, other medicines, foods, dyes, or preservatives -pregnant or trying to get pregnant -breast-feeding How should I use this medicine? Take this medicine by mouth with a glass of water. Follow the directions on the prescription label. You can take it with or without food. If it upsets your stomach, take it with food. Take your medicine at regular intervals. Do not take it more often than directed. Do not stop taking except on your doctor's advice. Talk to your pediatrician regarding the use of this medicine in children. Special care may be needed. Overdosage: If you think you have taken too much of this medicine contact a poison control center or emergency room at once. NOTE: This medicine is only for you. Do not share this medicine with others. What if I miss a dose? If you miss a dose, take it as soon as you can. If it is almost time for your next dose, take only that dose. Do not take double or extra doses. What may interact with this  medicine? Do not take this medicine with any of the following medications: -arsenic trioxide -certain antipsychotics like chlorpromazine, clozapine, haloperidol, mesoridazine, olanzapine, perphenazine, pimozide, risperidone, sertindole, thioridazine, ziprasidone -certain medicines for irregular heart beat like amiodarone, bepridil, dofetilide, encainide, flecainide, propafenone, quinidine -certain medicines used to treat infections like chloroquine, clarithromycin, erythromycin, pentamidine, posaconazole -certain medicines used for nausea like dolasetron, droperidol, ondansetron, palonosetron -cisapride -cyclobenzaprine -MAOIs like Carbex, Eldepryl, Marplan, Nardil, and Parnate -mefloquine -methadone -quinine -ranolazine This medicine may also interact with the following medications: -alcohol -aprepitant -certain antiviral medicines for HIV or AIDS -certain medicines for depression or anxiety -certain medicines for fungal infections like fluconazole, ketoconazole, and itraconazole -codeine -digoxin -diltiazem -grapefruit juice -hydrocodone -memantine -tacrolimus -telavancin -telithromycin -tetrabenazine -vardenafil -verapamil -vorinostat This list may not describe all possible interactions. Give your health care provider a list of all the medicines, herbs, non-prescription drugs, or dietary supplements you use. Also tell them if you smoke, drink alcohol, or use illegal drugs. Some items may interact with your medicine. What should I watch for while using this medicine? Tell your doctor or healthcare professional if your symptoms do not start to get better or if they get worse. Visit your doctor or health care professional for regular checks on your progress. You may get dizzy. Contact your doctor right away if you feel faint or have fainting spells while taking this medicine. Do not drive, use machinery, or do anything that needs mental alertness until you know how this medicine  affects you. Do not stand or sit up quickly, especially if you are an older patient. This reduces the risk of  dizzy or fainting spells. Avoid alcoholic drinks; they can make you more dizzy. What side effects may I notice from receiving this medicine? Side effects that you should report to your doctor or health care professional as soon as possible: -allergic reactions like skin rash, itching or hives, swelling of the face, lips, or tongue -changes in vision -chills -confusion -decreased hearing -fast or irregular heartbeat -feeling faint or lightheaded, falls -fever -muscle pain -ringing of the ears -sensitivity to light -unusual bleeding or bruising -unusually weak or tired Side effects that usually do not require medical attention (report to your doctor or health care professional if they continue or are bothersome): -cough -diarrhea -dizziness -drowsiness -headache -nausea, vomiting -stomach pain This list may not describe all possible side effects. Call your doctor for medical advice about side effects. You may report side effects to FDA at 1-800-FDA-1088. Where should I keep my medicine? Keep out of the reach of children. This medicine may cause accidental overdose and death if it taken by other adults, children, or pets. Mix any unused medicine with a substance like cat litter or coffee grounds. Then throw the medicine away in a sealed container like a sealed bag or a coffee can with a lid. Do not use the medicine after the expiration date. Store at room temperature between 15 and 30 degrees C (59 and 86 degrees F). NOTE: This sheet is a summary. It may not cover all possible information. If you have questions about this medicine, talk to your doctor, pharmacist, or health care provider.  2018 Elsevier/Gold Standard (2015-08-30 10:09:46)

## 2018-07-20 ENCOUNTER — Telehealth: Payer: Self-pay | Admitting: Cardiology

## 2018-07-20 NOTE — Telephone Encounter (Signed)
Pt saw Dr. Letta Pate yesterday (Phys med and rehab) as was given Nuedexta for constant crying... Pt daughter asking if okay with her cardiac meds. Will forward to RX and Dr.Hochrein for review and advice.

## 2018-07-20 NOTE — Telephone Encounter (Signed)
Pt daughter advised Kristin's message re: the Nuedexta. Will add to her med list.

## 2018-07-20 NOTE — Telephone Encounter (Signed)
The Nuedexta should be fine with her current cardiac medications

## 2018-07-20 NOTE — Telephone Encounter (Signed)
New Message:     Pt's daughter called and wants to know if Dr Percival Spanish thinks it is okay for pt to take Nuedexta along with her other medicine?

## 2018-07-21 NOTE — Telephone Encounter (Signed)
Agree 

## 2018-07-27 ENCOUNTER — Telehealth: Payer: Self-pay

## 2018-07-27 NOTE — Telephone Encounter (Signed)
Pt daughter called and said pt received samples of Nudexta and she would like a prescription sent in.

## 2018-07-29 MED ORDER — DEXTROMETHORPHAN-QUINIDINE 20-10 MG PO CAPS: 1.0000 | ORAL_CAPSULE | Freq: Every day | ORAL | 3 refills | Status: DC

## 2018-07-29 NOTE — Addendum Note (Signed)
Addended by: Alysia Penna E on: 07/29/2018 06:00 AM   Modules accepted: Orders

## 2018-08-02 ENCOUNTER — Telehealth: Payer: Self-pay | Admitting: Nurse Practitioner

## 2018-08-02 NOTE — Telephone Encounter (Signed)
Prescription sent in. Daughter calls stating insurance will not pay for medication. A PA was submitted and approved through 08-10-2018. I told daughter to go ahead and get this prescription and if we needed to do another PA in January we will.

## 2018-08-02 NOTE — Telephone Encounter (Signed)
Anything that does not have a decongestant in it.

## 2018-08-02 NOTE — Telephone Encounter (Signed)
Pt 's sister aware.

## 2018-08-09 ENCOUNTER — Telehealth: Payer: Self-pay | Admitting: Nurse Practitioner

## 2018-08-09 NOTE — Telephone Encounter (Signed)
Aware.  Purchase allergy medication and nasal spray to aid with symptoms.

## 2018-08-10 DIAGNOSIS — R509 Fever, unspecified: Secondary | ICD-10-CM | POA: Diagnosis not present

## 2018-08-10 DIAGNOSIS — R6889 Other general symptoms and signs: Secondary | ICD-10-CM | POA: Diagnosis not present

## 2018-08-10 DIAGNOSIS — J181 Lobar pneumonia, unspecified organism: Secondary | ICD-10-CM | POA: Diagnosis not present

## 2018-08-10 DIAGNOSIS — R05 Cough: Secondary | ICD-10-CM | POA: Diagnosis not present

## 2018-08-17 ENCOUNTER — Ambulatory Visit (INDEPENDENT_AMBULATORY_CARE_PROVIDER_SITE_OTHER): Payer: Medicare Other | Admitting: Family Medicine

## 2018-08-17 ENCOUNTER — Encounter: Payer: Self-pay | Admitting: Family Medicine

## 2018-08-17 VITALS — BP 134/76 | HR 68 | Temp 97.0°F | Ht 62.0 in | Wt 135.0 lb

## 2018-08-17 DIAGNOSIS — J189 Pneumonia, unspecified organism: Secondary | ICD-10-CM | POA: Diagnosis not present

## 2018-08-17 DIAGNOSIS — Z09 Encounter for follow-up examination after completed treatment for conditions other than malignant neoplasm: Secondary | ICD-10-CM | POA: Diagnosis not present

## 2018-08-17 NOTE — Progress Notes (Signed)
Subjective:    Patient ID: Brooke Chavez, female    DOB: [birth year], 78 y.o.   MRN: [id number]  Chief Complaint:  Followup pneumonia (diagnosed one week ago, still has cough, runny nose)   HPI: Brooke Chavez is a 78 y.o. female presenting on 08/17/2018 for Followup pneumonia (diagnosed one week ago, still has cough, runny nose)  Pt was seen at Urgent Care on 08/10/18 for CAP. Pt was treated in office with 1 gm of Rocephin IM and then placed on Keflex 500 mg QID. Pt has improved since Urgent Care visit. Family states she has rhinorrhea but states it is clear. Denies fever, shortness of breath, chest pain, or sputum production. States she does still have a slight cough that is worse at night.   Relevant past medical, surgical, family, and social history reviewed and updated as indicated.  Allergies and medications reviewed and updated.   Past Medical History:  Diagnosis Date  . Atrial flutter ([place])    Typical by EKG diagnosis 9/11 s/p CIT ablation 11/11  . Bradycardia   . Chronic diastolic congestive heart failure ([place])   . 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 DJD (degenerative joint disease)   . Dysrhythmia   . Femur fracture, left ([place]) 2013  . Global aphasia   . Hyperlipidemia    x5 years  . Hypertension    Since 1997  . 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 Liver masses 11/13/2017   Multiple small nodules seen on CT and MRI  . Mitral regurgitation    severe  . 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 Stroke ([place])   . 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 A FLUTTER ABLATION N/A 06/27/2011   Procedure:  ABLATION A FLUTTER;  Surgeon: Thompson Grayer, MD;  Location: [facility] CATH LAB;  Service: Cardiovascular;  Laterality: N/A;  . ATRIAL ABLATION SURGERY  06/2010  . HIP SURGERY     Left (fracture) 3/13  . JOINT REPLACEMENT     both knees   . KNEE ARTHROSCOPY     Right  . LUMBAR SPINE SURGERY    . MITRAL VALVE REPAIR Right 01/07/2018   Procedure: MINIMALLY INVASIVE MITRAL VALVE REPAIR using LivaNova ring size 28 MM;  Surgeon: Rexene Alberts, MD;  Location: [place];  Service: Open Heart Surgery;  Laterality: Right;  . PATENT FORAMEN OVALE(PFO) CLOSURE N/A 01/07/2018   Procedure: PATENT FORAMEN OVALE (PFO) CLOSURE;  Surgeon: Rexene Alberts, MD;  Location: [place];  Service: Open Heart Surgery;  Laterality: N/A;  . RIGHT/LEFT HEART CATH AND CORONARY ANGIOGRAPHY N/A 11/11/2017   Procedure: RIGHT/LEFT HEART CATH AND CORONARY ANGIOGRAPHY;  Surgeon: Sherren Mocha, MD;  Location: [place] CV LAB;  Service: Cardiovascular;  Laterality: N/A;  . TEE WITHOUT CARDIOVERSION N/A 09/18/2017   Procedure: TRANSESOPHAGEAL ECHOCARDIOGRAM (TEE);  Surgeon: Skeet Latch, MD;  Location: [place];  Service: Cardiovascular;  Laterality: N/A;  . TEE WITHOUT CARDIOVERSION N/A 01/07/2018   Procedure: TRANSESOPHAGEAL ECHOCARDIOGRAM (TEE);  Surgeon: Rexene Alberts, MD;  Location: [place];  Service: Open Heart Surgery;  Laterality: N/A;  . TOTAL  KNEE ARTHROPLASTY     Left  . TUBAL LIGATION      Social History   Socioeconomic History  . Marital status: Widowed    Spouse name: Not on file  . Number of children: 3  . Years of education: 94  . Highest education level: Not on file  Occupational History    Comment: na  Social Needs  . Financial resource strain: Not on file  . Food insecurity:    Worry: Not on file    Inability: Not on file  . Transportation needs:    Medical: Not on file    Non-medical: Not on file  Tobacco Use  . Smoking status: Never Smoker  . Smokeless tobacco: Never Used  Substance and Sexual  Activity  . Alcohol use: No  . Drug use: No  . Sexual activity: Never  Lifestyle  . Physical activity:    Days per week: Not on file    Minutes per session: Not on file  . Stress: Not on file  Relationships  . Social connections:    Talks on phone: Not on file    Gets together: Not on file    Attends religious service: Not on file    Active member of club or organization: Not on file    Attends meetings of clubs or organizations: Not on file    Relationship status: Not on file  . Intimate partner violence:    Fear of current or ex partner: Not on file    Emotionally abused: Not on file    Physically abused: Not on file    Forced sexual activity: Not on file  Other Topics Concern  . Not on file  Social History Narrative   Lives in [place]   Her daughter lives with her   Little caffeine    Outpatient Encounter Medications as of 08/17/2018  Medication Sig  . acetaminophen (TYLENOL) 325 MG tablet Take 2 tablets (650 mg total) by mouth every 6 (six) hours as needed for mild pain (or Fever >/= 101).  Marland Kitchen apixaban (ELIQUIS) 5 MG TABS tablet Take 1 tablet (5 mg total) by mouth 2 (two) times daily.  Marland Kitchen atorvastatin (LIPITOR) 40 MG tablet Take 1 tablet (40 mg total) by mouth at bedtime.  . cephALEXin (KEFLEX) 500 MG capsule Take 500 mg by mouth 4 (four) times daily. X 10 days  . Dextromethorphan-quiNIDine (NUEDEXTA) 20-10 MG CAPS Take 1 capsule by mouth daily.  . magnesium oxide (MAG-OX) 400 MG tablet Take 1 tablet (400 mg total) by mouth daily.  . sertraline (ZOLOFT) 100 MG tablet Take 1 tablet (100 mg total) by mouth daily.  Marland Kitchen spironolactone (ALDACTONE) 25 MG tablet Take 1 tablet (25 mg total) by mouth daily.   No facility-administered encounter medications on file as of 08/17/2018.     Allergies  Allergen Reactions  . Oxycodone-Acetaminophen Anxiety and Other (See Comments)    Hallucinations  . Aspirin Nausea And Vomiting and Nausea Only    Other reaction(s): GI Upset (intolerance)     Review of Systems  Constitutional: Negative for chills, fatigue and fever.  HENT: Positive for rhinorrhea.   Respiratory: Positive for cough. Negative for shortness of breath.   Cardiovascular: Negative for chest pain and palpitations.  Gastrointestinal: Negative for constipation, diarrhea, nausea and vomiting.  Neurological: Negative for headaches.  Psychiatric/Behavioral: Negative for confusion (baseline).  All other systems reviewed and are negative.       Objective:    BP 134/76   Pulse  68   Temp (!) 97 F (36.1 C) (Oral)   Ht 5' 2"  (1.575 m)   Wt 135 lb (61.2 kg)   LMP 11/05/1992   SpO2 95%   BMI 24.69 kg/m    Wt Readings from Last 3 Encounters:  08/17/18 135 lb (61.2 kg)  07/19/18 127 lb (57.6 kg)  07/07/18 130 lb 6.4 oz (59.1 kg)    Physical Exam Vitals signs and nursing note reviewed.  Constitutional:      Appearance: She is well-developed and well-groomed. She is not ill-appearing or toxic-appearing.  HENT:     Head: Normocephalic and atraumatic.     Right Ear: Hearing, tympanic membrane, ear canal and external ear normal.     Left Ear: Hearing, tympanic membrane, ear canal and external ear normal.     Nose: Rhinorrhea present. No congestion. Rhinorrhea is clear.     Mouth/Throat:     Lips: Pink.     Mouth: Mucous membranes are moist.     Pharynx: Oropharynx is clear. Uvula midline.  Eyes:     General: Lids are normal.     Conjunctiva/sclera: Conjunctivae normal.  Neck:     Musculoskeletal: Neck supple.  Cardiovascular:     Rate and Rhythm: Normal rate. Rhythm regularly irregular.     Heart sounds: Normal heart sounds.  Pulmonary:     Effort: Pulmonary effort is normal. No respiratory distress.     Breath sounds: Examination of the right-lower field reveals rhonchi. Examination of the left-lower field reveals rhonchi. Rhonchi (mild) present.  Skin:    General: Skin is warm and dry.     Capillary Refill: Capillary refill takes less than 2  seconds.     Coloration: Skin is not cyanotic.  Neurological:     Mental Status: She is alert. Mental status is at baseline.  Psychiatric:        Mood and Affect: Mood normal.        Behavior: Behavior is cooperative.     Results for orders placed or performed in visit on 07/01/18  CMP14+EGFR  Result Value Ref Range   Glucose 88 65 - 99 mg/dL   BUN 14 8 - 27 mg/dL   Creatinine, Ser 1.39 (H) 0.57 - 1.00 mg/dL   GFR calc non Af Amer 37 (L) >59 mL/min/1.73   GFR calc Af Amer 42 (L) >59 mL/min/1.73   BUN/Creatinine Ratio 10 (L) 12 - 28   Sodium 140 134 - 144 mmol/L   Potassium 4.7 3.5 - 5.2 mmol/L   Chloride 100 96 - 106 mmol/L   CO2 23 20 - 29 mmol/L   Calcium 9.9 8.7 - 10.3 mg/dL   Total Protein 6.6 6.0 - 8.5 g/dL   Albumin 4.2 3.5 - 4.8 g/dL   Globulin, Total 2.4 1.5 - 4.5 g/dL   Albumin/Globulin Ratio 1.8 1.2 - 2.2   Bilirubin Total 0.7 0.0 - 1.2 mg/dL   Alkaline Phosphatase 85 39 - 117 IU/L   AST 20 0 - 40 IU/L   ALT 14 0 - 32 IU/L  Lipid panel  Result Value Ref Range   Cholesterol, Total 177 100 - 199 mg/dL   Triglycerides 87 0 - 149 mg/dL   HDL 60 >39 mg/dL   VLDL Cholesterol Cal 17 5 - 40 mg/dL   LDL Calculated 100 (H) 0 - 99 mg/dL   Chol/HDL Ratio 3.0 0.0 - 4.4 ratio       Pertinent labs & imaging results that were available during my  care of the patient were reviewed by me and considered in my medical decision making.  Assessment & Plan:  Heidie was seen today for followup pneumonia.  Diagnoses and all orders for this visit:  Follow up Pt was seen at Urgent Care on 08/10/18 for CAP. Pt was treated in office with 1 gm of Rocephin IM and then placed on Keflex 500 mg QID. Pt has improved since Urgent Care visit.   Community acquired pneumonia, unspecified laterality Improved since Urgent Care visit. Pt to complete course of Keflex. Report any new or worsening symptoms. Return in 4 weeks for reevaluation.  Adequate hydration discussed. Signs and symptoms that  warrant emergent evaluation discussed.   Continue all other maintenance medications.  Follow up plan: Return in about 4 weeks (around 09/14/2018), or if symptoms worsen or fail to improve.  Educational handout given for CAP  The above assessment and management plan was discussed with the patient. The patient verbalized understanding of and has agreed to the management plan. Patient is aware to call the clinic if symptoms persist or worsen. Patient is aware when to return to the clinic for a follow-up visit. Patient educated on when it is appropriate to go to the emergency department.   Monia Pouch, FNP-C [facility] [phone]

## 2018-08-17 NOTE — Patient Instructions (Signed)
Community-Acquired Pneumonia, Adult  Pneumonia is an infection of the lungs. It causes swelling in the airways of the lungs. Mucus and fluid may also build up inside the airways.  One type of pneumonia can happen while a person is in a hospital. A different type can happen when a person is not in a hospital (community-acquired pneumonia).   What are the causes?    This condition is caused by germs (viruses, bacteria, or fungi). Some types of germs can be passed from one person to another. This can happen when you breathe in droplets from the cough or sneeze of an infected person.  What increases the risk?  You are more likely to develop this condition if you:   Have a long-term (chronic) disease, such as:  ? Chronic obstructive pulmonary disease (COPD).  ? Asthma.  ? Cystic fibrosis.  ? Congestive heart failure.  ? Diabetes.  ? Kidney disease.   Have HIV.   Have sickle cell disease.   Have had your spleen removed.   Do not take good care of your teeth and mouth (poor dental hygiene).   Have a medical condition that increases the risk of breathing in droplets from your own mouth and nose.   Have a weakened body defense system (immune system).   Are a smoker.   Travel to areas where the germs that cause this illness are common.   Are around certain animals or the places they live.  What are the signs or symptoms?   A dry cough.   A wet (productive) cough.   Fever.   Sweating.   Chest pain. This often happens when breathing deeply or coughing.   Fast breathing or trouble breathing.   Shortness of breath.   Shaking chills.   Feeling tired (fatigue).   Muscle aches.  How is this treated?  Treatment for this condition depends on many things. Most adults can be treated at home. In some cases, treatment must happen in a hospital. Treatment may include:   Medicines given by mouth or through an IV tube.   Being given extra oxygen.   Respiratory therapy.  In rare cases, treatment for very bad pneumonia  may include:   Using a machine to help you breathe.   Having a procedure to remove fluid from around your lungs.  Follow these instructions at home:  Medicines   Take over-the-counter and prescription medicines only as told by your doctor.  ? Only take cough medicine if you are losing sleep.   If you were prescribed an antibiotic medicine, take it as told by your doctor. Do not stop taking the antibiotic even if you start to feel better.  General instructions     Sleep with your head and neck raised (elevated). You can do this by sleeping in a recliner or by putting a few pillows under your head.   Rest as needed. Get at least 8 hours of sleep each night.   Drink enough water to keep your pee (urine) pale yellow.   Eat a healthy diet that includes plenty of vegetables, fruits, whole grains, low-fat dairy products, and lean protein.   Do not use any products that contain nicotine or tobacco. These include cigarettes, e-cigarettes, and chewing tobacco. If you need help quitting, ask your doctor.   Keep all follow-up visits as told by your doctor. This is important.  How is this prevented?  A shot (vaccine) can help prevent pneumonia. Shots are often suggested for:   People   older than 78 years of age.   People older than 78 years of age who:  ? Are having cancer treatment.  ? Have long-term (chronic) lung disease.  ? Have problems with their body's defense system.  You may also prevent pneumonia if you take these actions:   Get the flu (influenza) shot every year.   Go to the dentist as often as told.   Wash your hands often. If you cannot use soap and water, use hand sanitizer.  Contact a doctor if:   You have a fever.   You lose sleep because your cough medicine does not help.  Get help right away if:   You are short of breath and it gets worse.   You have more chest pain.   Your sickness gets worse. This is very serious if:  ? You are an older adult.  ? Your body's defense system is weak.   You  cough up blood.  Summary   Pneumonia is an infection of the lungs.   Most adults can be treated at home. Some will need treatment in a hospital.   Drink enough water to keep your pee pale yellow.   Get at least 8 hours of sleep each night.  This information is not intended to replace advice given to you by your health care provider. Make sure you discuss any questions you have with your health care provider.  Document Released: 01/14/2008 Document Revised: 03/25/2018 Document Reviewed: 03/25/2018  Elsevier Interactive Patient Education  2019 Elsevier Inc[employer]

## 2018-08-30 ENCOUNTER — Encounter: Payer: Medicare Other | Attending: Registered Nurse

## 2018-08-30 ENCOUNTER — Encounter: Payer: Self-pay | Admitting: Physical Medicine & Rehabilitation

## 2018-08-30 ENCOUNTER — Ambulatory Visit (HOSPITAL_BASED_OUTPATIENT_CLINIC_OR_DEPARTMENT_OTHER): Payer: Medicare Other | Admitting: Physical Medicine & Rehabilitation

## 2018-08-30 VITALS — BP 168/100 | HR 60 | Ht 59.0 in | Wt 136.0 lb

## 2018-08-30 DIAGNOSIS — I5032 Chronic diastolic (congestive) heart failure: Secondary | ICD-10-CM | POA: Diagnosis not present

## 2018-08-30 DIAGNOSIS — I639 Cerebral infarction, unspecified: Secondary | ICD-10-CM | POA: Insufficient documentation

## 2018-08-30 DIAGNOSIS — Z96653 Presence of artificial knee joint, bilateral: Secondary | ICD-10-CM | POA: Insufficient documentation

## 2018-08-30 DIAGNOSIS — F482 Pseudobulbar affect: Secondary | ICD-10-CM | POA: Diagnosis not present

## 2018-08-30 DIAGNOSIS — Z7901 Long term (current) use of anticoagulants: Secondary | ICD-10-CM | POA: Insufficient documentation

## 2018-08-30 DIAGNOSIS — Z79899 Other long term (current) drug therapy: Secondary | ICD-10-CM | POA: Diagnosis not present

## 2018-08-30 DIAGNOSIS — I69351 Hemiplegia and hemiparesis following cerebral infarction affecting right dominant side: Secondary | ICD-10-CM | POA: Insufficient documentation

## 2018-08-30 DIAGNOSIS — Z8249 Family history of ischemic heart disease and other diseases of the circulatory system: Secondary | ICD-10-CM | POA: Diagnosis not present

## 2018-08-30 DIAGNOSIS — I63512 Cerebral infarction due to unspecified occlusion or stenosis of left middle cerebral artery: Secondary | ICD-10-CM | POA: Insufficient documentation

## 2018-08-30 DIAGNOSIS — I69390 Apraxia following cerebral infarction: Secondary | ICD-10-CM | POA: Diagnosis not present

## 2018-08-30 DIAGNOSIS — E785 Hyperlipidemia, unspecified: Secondary | ICD-10-CM | POA: Insufficient documentation

## 2018-08-30 DIAGNOSIS — R4701 Aphasia: Secondary | ICD-10-CM | POA: Diagnosis not present

## 2018-08-30 DIAGNOSIS — I69320 Aphasia following cerebral infarction: Secondary | ICD-10-CM | POA: Diagnosis not present

## 2018-08-30 DIAGNOSIS — I110 Hypertensive heart disease with heart failure: Secondary | ICD-10-CM | POA: Insufficient documentation

## 2018-08-30 NOTE — Patient Instructions (Signed)
Dextromethorphan; Quinidine oral capsules What is this medicine? DEXTROMETHORPHAN; QUINIDINE (dex troe meth OR fan; KWIN i deen) is a combination of two medicines used to treat pseudobulbar affect (PBA), a condition that causes uncontrollable, sudden, and frequent episodes of laughing and/or crying. This medicine may be used for other purposes; ask your health care provider or pharmacist if you have questions. COMMON BRAND NAME(S): Nuedexta What should I tell my health care provider before I take this medicine? They need to know if you have any of these conditions: -dementia -heart disease -history of drug abuse or alcohol abuse problem -history of low blood counts caused by a medicine -history of irregular heartbeat -kidney disease -liver disease -low levels of magnesium or potassium in the blood -myasthenia gravis -an unusual or allergic reaction to dextromethorphan, quinidine, other medicines, foods, dyes, or preservatives -pregnant or trying to get pregnant -breast-feeding How should I use this medicine? Take this medicine by mouth with a glass of water. Follow the directions on the prescription label. You can take it with or without food. If it upsets your stomach, take it with food. Take your medicine at regular intervals. Do not take it more often than directed. Do not stop taking except on your doctor's advice. Talk to your pediatrician regarding the use of this medicine in children. Special care may be needed. Overdosage: If you think you have taken too much of this medicine contact a poison control center or emergency room at once. NOTE: This medicine is only for you. Do not share this medicine with others. What if I miss a dose? If you miss a dose, take it as soon as you can. If it is almost time for your next dose, take only that dose. Do not take double or extra doses. What may interact with this medicine? Do not take this medicine with any of the following  medications: -certain antipsychotics like chlorpromazine, haloperidol, pimozide, risperidone, and thioridazine -certain medicines for irregular heart beat like dofetilide, encainide, flecainide, mexiletine, propafenone, and quinidine -cisapride -dolasetron -dronedarone -MAOIs like Carbex, Eldepryl, Marplan, Nardil, and Parnate -mefloquine -quinine This medicine may also interact with the following medications: -alcohol -aprepitant -certain antivirals for HIV or hepatitis -certain medicines for depression like amitriptyline, citalopram, desipramine, escitalopram, fluoxetine, fluvoxamine, nefazodone, paroxetine, and sertraline -certain medicines for fungal infections like fluconazole, itraconazole, and ketoconazole -certain antibiotics like clarithromycin, erythromycin, and telithromycin -codeine -digoxin -diltiazem -grapefruit juice -hydrocodone -memantine -other medicines that prolong the QT interval (an abnormal heart rhythm) -verapamil This list may not describe all possible interactions. Give your health care provider a list of all the medicines, herbs, non-prescription drugs, or dietary supplements you use. Also tell them if you smoke, drink alcohol, or use illegal drugs. Some items may interact with your medicine. What should I watch for while using this medicine? Tell your doctor or healthcare professional if your symptoms do not start to get better or if they get worse. Visit your doctor or health care professional for regular checks on your progress. You may get dizzy. Contact your doctor right away if you feel faint or have fainting spells while taking this medicine. Do not drive, use machinery, or do anything that needs mental alertness until you know how this medicine affects you. Do not stand or sit up quickly, especially if you are an older patient. This reduces the risk of dizzy or fainting spells. Avoid alcoholic drinks; they can make you more dizzy. What side effects may I  notice from receiving this medicine? Side effects  that you should report to your doctor or health care professional as soon as possible: -allergic reactions like skin rash, itching or hives, swelling of the face, lips, or tongue -breathing problems -changes in vision -decreased hearing -lupus-like symptoms such as sensitivity to light; swollen or painful joints; skin rash; unexplained fever; anemia; unusually weak or tired -signs and symptoms of a dangerous change in heartbeat or heart rhythm like chest pain; dizziness; fast, irregular heartbeat; palpitations; feeling faint or lightheaded; falls; breathing problems -signs and symptoms of liver injury like dark yellow or brown urine; general ill feeling or flu-like symptoms; light-colored stools; loss of appetite; nausea; right upper belly pain; unusually weak or tired; yellowing of the eyes or skin -signs and symptoms of serotonin syndrome like confusion; increased sweating; fever; tremor; stiff muscles; diarrhea -unusual bleeding or bruising Side effects that usually do not require medical attention (report to your doctor or health care professional if they continue or are bothersome): -cough -diarrhea -dizziness -drowsiness This list may not describe all possible side effects. Call your doctor for medical advice about side effects. You may report side effects to FDA at 1-800-FDA-1088. Where should I keep my medicine? Keep out of the reach of children. This medicine may cause accidental overdose and death if it taken by other adults, children, or pets. Mix any unused medicine with a substance like cat litter or coffee grounds. Then throw the medicine away in a sealed container like a sealed bag or a coffee can with a lid. Do not use the medicine after the expiration date. Store at room temperature between 15 and 30 degrees C (59 and 86 degrees F). NOTE: This sheet is a summary. It may not cover all possible information. If you have questions  about this medicine, talk to your doctor, pharmacist, or health care provider.  2019 Elsevier/Gold Standard (2017-10-07 16:56:05)

## 2018-08-30 NOTE — Progress Notes (Signed)
Subjective:    Patient ID: Brooke Chavez, female    DOB: 06-Sep-1940, 78 y.o.   MRN: [id number] 78 year old right-handed female with complex medical history with mitral valve disease, atrial flutter status post mitral valve repair 01/07/2018 per Dr. Roxy Manns.  She was discharged on Coumadin 01/14/2018 as well as history of  hypertension, chronic diastolic congestive heart failure.  She presented to [facility] on 01/24/2018 after being found down by family with right-sided weakness and aphasia.  She lives with her daughter.  Reported to be independent prior to  mitral valve repair.  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  She was [facility] to [facility] for [facility] care.   INR upon admission of 1.7.  CT angiogram of head and neck showed left proximal M1 occlusion with thrombus measuring 8 mm in length.  Interventional radiology consulted.  No plan for intervention due to high risk of bleeding.  Echocardiogram with  ejection fraction of 55%.  Systolic function normal.  No PFO.  MRI of the brain showed large left MCA territory infarction with petechial hemorrhage, old left cerebellar infarction.  Cardiology service is consulted.  No plan for TEE. HPI  Pt here for follow up on Pseudo bulbar affect, had tried zoloft 100mg  per day but is now on Nuedextra which is working much better.  Family concerned about cost, we discussed other options including amitriptyline but daughter did not want to try after discussing potential side effects Pain Inventory Average Pain 0 Pain Right Now 0 My pain is na  In the last 24 hours, has pain interfered with the following? General activity 0 Relation with others 0 Enjoyment of life 0 What TIME of day is your pain at its worst? na Sleep (in general) Good  Pain is worse with: na Pain improves with: na Relief from Meds:  0  Mobility walk with assistance use a cane ability to climb steps?  yes do you drive?  no  Function Do you have any goals in this area?  no  Neuro/Psych No problems in this area  Prior Studies Any changes since last visit?  no  Physicians involved in your care Any changes since last visit?  no   Family History  Problem Relation Age of Onset  . Hypertension Mother   . Cancer Sister        leukemia  . Diabetes Brother   . Stroke Sister   . Diabetes Brother   . Heart attack Brother   . Healthy Daughter   . Healthy Son   . Healthy Daughter    Social History   Socioeconomic History  . Marital status: Widowed    Spouse name: Not on file  . Number of children: 3  . Years of education: 79  . Highest education level: Not on file  Occupational History    Comment: na  Social Needs  . Financial resource strain: Not on file  . Food insecurity:    Worry: Not on file    Inability: Not on file  . Transportation needs:    Medical: Not on file    Non-medical: Not on file  Tobacco Use  . Smoking status: Never Smoker  . Smokeless tobacco: Never Used  Substance and Sexual Activity  . Alcohol use: No  . Drug use: No  . Sexual activity: Never  Lifestyle  . Physical activity:  Days per week: Not on file    Minutes per session: Not on file  . Stress: Not on file  Relationships  . Social connections:    Talks on phone: Not on file    Gets together: Not on file    Attends religious service: Not on file    Active member of club or organization: Not on file    Attends meetings of clubs or organizations: Not on file    Relationship status: Not on file  Other Topics Concern  . Not on file  Social History Narrative   Lives in [place]   Her daughter lives with her   Little caffeine   Past Surgical History:  Procedure Laterality Date  . A FLUTTER ABLATION N/A 06/27/2011   Procedure: ABLATION A FLUTTER;  Surgeon: Thompson Grayer, MD;  Location: [facility] CATH LAB;  Service:  Cardiovascular;  Laterality: N/A;  . ATRIAL ABLATION SURGERY  06/2010  . HIP SURGERY     Left (fracture) 3/13  . JOINT REPLACEMENT     both knees   . KNEE ARTHROSCOPY     Right  . LUMBAR SPINE SURGERY    . MITRAL VALVE REPAIR Right 01/07/2018   Procedure: MINIMALLY INVASIVE MITRAL VALVE REPAIR using LivaNova ring size 28 MM;  Surgeon: Rexene Alberts, MD;  Location: [place];  Service: Open Heart Surgery;  Laterality: Right;  . PATENT FORAMEN OVALE(PFO) CLOSURE N/A 01/07/2018   Procedure: PATENT FORAMEN OVALE (PFO) CLOSURE;  Surgeon: Rexene Alberts, MD;  Location: [place];  Service: Open Heart Surgery;  Laterality: N/A;  . RIGHT/LEFT HEART CATH AND CORONARY ANGIOGRAPHY N/A 11/11/2017   Procedure: RIGHT/LEFT HEART CATH AND CORONARY ANGIOGRAPHY;  Surgeon: Sherren Mocha, MD;  Location: [place] CV LAB;  Service: Cardiovascular;  Laterality: N/A;  . TEE WITHOUT CARDIOVERSION N/A 09/18/2017   Procedure: TRANSESOPHAGEAL ECHOCARDIOGRAM (TEE);  Surgeon: Skeet Latch, MD;  Location: [place];  Service: Cardiovascular;  Laterality: N/A;  . TEE WITHOUT CARDIOVERSION N/A 01/07/2018   Procedure: TRANSESOPHAGEAL ECHOCARDIOGRAM (TEE);  Surgeon: Rexene Alberts, MD;  Location: [place];  Service: Open Heart Surgery;  Laterality: N/A;  . TOTAL KNEE ARTHROPLASTY     Left  . TUBAL LIGATION     Past Medical History:  Diagnosis Date  . Atrial flutter ([place])    Typical by EKG diagnosis 9/11 s/p CIT ablation 11/11  . Bradycardia   . Chronic diastolic congestive heart failure ([place])   . 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 DJD (degenerative joint disease)   . Dysrhythmia   . Femur fracture, left ([place]) 2013  . Global aphasia   . Hyperlipidemia    x5 years  . Hypertension    Since 1997  . 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 Liver masses 11/13/2017   Multiple small nodules seen on CT and MRI  . Mitral  regurgitation    severe  . 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 Stroke ([place])   . TR (tricuspid regurgitation)    Mild with RA enlargment   BP (!) 168/100   Pulse 60   Ht 4\' 11"  (1.499 m)   Wt 136 lb (61.7 kg)   LMP 11/05/1992   SpO2  97%   BMI 27.47 kg/m   Opioid Risk Score:   Fall Risk Score:  `1  Depression screen PHQ 2/9  Depression screen [facility] 2/9 08/17/2018 06/21/2018 03/05/2018 03/02/2018 11/03/2017 05/19/2017 04/28/2017  Decreased Interest 0 0 0 2 0 0 0  Down, Depressed, Hopeless 0 0 0 2 0 0 0  PHQ - 2 Score 0 0 0 4 0 0 0  Altered sleeping - - 0 3 - - -  Tired, decreased energy - - 0 3 - - -  Change in appetite - - 0 2 - - -  Feeling bad or failure about yourself  - - 0 1 - - -  Trouble concentrating - - 0 1 - - -  Moving slowly or fidgety/restless - - 0 2 - - -  Suicidal thoughts - - 0 0 - - -  PHQ-9 Score - - 0 16 - - -  Some recent data might be hidden     Review of Systems  Constitutional: Negative.   HENT: Negative.   Eyes: Negative.   Respiratory: Negative.   Cardiovascular: Negative.   Gastrointestinal: Negative.   Endocrine: Negative.   Genitourinary: Negative.   Musculoskeletal: Positive for gait problem.  Skin: Negative.   Allergic/Immunologic: Negative.   Hematological: Negative.   Psychiatric/Behavioral: Negative.   All other systems reviewed and are negative.      Objective:   Physical Exam Vitals signs and nursing note reviewed.  Constitutional:      Appearance: Normal appearance. She is normal weight.  HENT:     Head: Normocephalic and atraumatic.     Right Ear: Tympanic membrane normal.     Left Ear: Tympanic membrane normal.     Nose: Nose normal.     Mouth/Throat:     Mouth: Mucous membranes are moist.  Eyes:     Extraocular  Movements: Extraocular movements intact.     Conjunctiva/sclera: Conjunctivae normal.     Pupils: Pupils are equal, round, and reactive to light.  Neurological:     Mental Status: She is alert.     Cranial Nerves: Dysarthria present.     Comments: Motor 5/5 in bilateral delt, bi, tri, grip, HF, KE, ADF  + aphasia and apraxia           Assessment & Plan:

## 2018-08-31 ENCOUNTER — Telehealth: Payer: Self-pay | Admitting: Nurse Practitioner

## 2018-08-31 NOTE — Telephone Encounter (Signed)
Does patient need to continue to take ? Please advise

## 2018-08-31 NOTE — Telephone Encounter (Signed)
Ok to stop magnesium but will have to check labs in a few weeks

## 2018-08-31 NOTE — Telephone Encounter (Signed)
Left message to call back  

## 2018-09-07 ENCOUNTER — Other Ambulatory Visit: Payer: Self-pay | Admitting: *Deleted

## 2018-09-07 ENCOUNTER — Telehealth: Payer: Self-pay | Admitting: *Deleted

## 2018-09-07 NOTE — Telephone Encounter (Signed)
Patient's daughter left message indicating bthat the patient would like to reinstate home health therapies. She states that patient terminated home health visits and now regrets that decision.  She is hoping to get PT and OT visits started up again.

## 2018-09-07 NOTE — Telephone Encounter (Signed)
Spoke to daughter and pt has no transportation during the day to get to outpatient therapy. No transportation until after 6 pm. Would need HH or facility that accommodate the hours.

## 2018-09-07 NOTE — Telephone Encounter (Signed)
The pt is now mobile enough for outpatient therapy, does she want a referral to outpt?

## 2018-09-08 NOTE — Telephone Encounter (Signed)
Family member aware and will relay message.

## 2018-09-09 NOTE — Telephone Encounter (Signed)
Spoke to daughter and she will get the application in.

## 2018-09-09 NOTE — Telephone Encounter (Signed)
Please get pt SCAT application, she is no longer home bound

## 2018-09-14 ENCOUNTER — Inpatient Hospital Stay: Payer: Medicare Other | Attending: Oncology

## 2018-09-14 ENCOUNTER — Inpatient Hospital Stay (HOSPITAL_BASED_OUTPATIENT_CLINIC_OR_DEPARTMENT_OTHER): Payer: Medicare Other | Admitting: Oncology

## 2018-09-14 ENCOUNTER — Telehealth: Payer: Self-pay | Admitting: Oncology

## 2018-09-14 VITALS — BP 156/80 | HR 46 | Temp 98.0°F | Resp 19 | Ht 59.0 in | Wt 138.8 lb

## 2018-09-14 DIAGNOSIS — I10 Essential (primary) hypertension: Secondary | ICD-10-CM

## 2018-09-14 DIAGNOSIS — K769 Liver disease, unspecified: Secondary | ICD-10-CM | POA: Insufficient documentation

## 2018-09-14 DIAGNOSIS — K8689 Other specified diseases of pancreas: Secondary | ICD-10-CM

## 2018-09-14 DIAGNOSIS — R600 Localized edema: Secondary | ICD-10-CM | POA: Insufficient documentation

## 2018-09-14 DIAGNOSIS — Z8673 Personal history of transient ischemic attack (TIA), and cerebral infarction without residual deficits: Secondary | ICD-10-CM | POA: Diagnosis not present

## 2018-09-14 DIAGNOSIS — C7A8 Other malignant neuroendocrine tumors: Secondary | ICD-10-CM | POA: Diagnosis not present

## 2018-09-14 DIAGNOSIS — I4892 Unspecified atrial flutter: Secondary | ICD-10-CM | POA: Diagnosis not present

## 2018-09-14 NOTE — Progress Notes (Signed)
  [place] OFFICE PROGRESS NOTE   Diagnosis: Pancreas neuroendocrine tumor  INTERVAL HISTORY:   Brooke Chavez returns for a scheduled visit.  She is here with her daughter.  No complaint.  Good appetite.  No fever, night sweats, or diarrhea.  Objective:  Vital signs in last 24 hours:  Blood pressure (!) 156/80, pulse (!) 46, temperature 98 F (36.7 C), temperature source Oral, resp. rate 19, height 4\' 11"  (1.499 m), weight 138 lb 12.8 oz (63 kg), last menstrual period 11/05/1992, SpO2 99 %.    HEENT: Neck without mass Lymphatics: No cervical or supraclavicular nodes Resp: Lungs clear bilaterally Cardio: Regular rate and rhythm GI: No hepatosplenomegaly, no apparent ascites, nontender, no mass Vascular: Trace edema at the right lower leg Neuro: Alert, follows commands, ambulates with assistance, partial expressive aphasia, mild right facial droop   Lab Results:  Lab Results  Component Value Date   WBC 9.4 03/13/2018   HGB 14.3 03/13/2018   HCT 45.3 03/13/2018   MCV 96.2 03/13/2018   PLT 214 03/13/2018   NEUTROABS 7.3 03/13/2018    CMP  Lab Results  Component Value Date   NA 140 07/01/2018   K 4.7 07/01/2018   CL 100 07/01/2018   CO2 23 07/01/2018   GLUCOSE 88 07/01/2018   BUN 14 07/01/2018   CREATININE 1.39 (H) 07/01/2018   CALCIUM 9.9 07/01/2018   PROT 6.6 07/01/2018   ALBUMIN 4.2 07/01/2018   AST 20 07/01/2018   ALT 14 07/01/2018   ALKPHOS 85 07/01/2018   BILITOT 0.7 07/01/2018   GFRNONAA 37 (L) 07/01/2018   GFRAA 42 (L) 07/01/2018    Medications: I have reviewed the patient's current medications.   Assessment/Plan:  1. Pancreas mass  0.8 cm hypervascular lesion in the pancreas body and multiple enhancing liver lesions noted on CT 11/13/2017  MRI 11/21/2017-hypervascular liver lesions evident on arterial phase, pancreas lesion not identified  Netspot 11/30/2017- intense uptake at the pancreas tail lesion consistent with a primary  neuroendocrine neoplasm, no uptake in the liver above background 2. Atrial flutter 3. 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 Left MCA CVA 01/24/2018 5. Hypertension    Disposition: Brooke Chavez appears unchanged.  There is no clinical evidence for progression of the pancreas tumor.  She appears asymptomatic.  She will be scheduled for a restaging CT and office visit in April of this year.  15 minutes were spent with the patient today.  The majority of the time was used for counseling and coordination of care.  Betsy Coder, MD  09/14/2018  3:52 PM

## 2018-09-14 NOTE — Telephone Encounter (Signed)
Gave patient avs report and appointments for April. Central radiology will call re scan.  °

## 2018-09-16 ENCOUNTER — Ambulatory Visit: Payer: Medicare Other | Admitting: Nurse Practitioner

## 2018-09-21 ENCOUNTER — Ambulatory Visit: Payer: Medicare Other | Admitting: Nurse Practitioner

## 2018-09-21 ENCOUNTER — Encounter: Payer: Self-pay | Admitting: Nurse Practitioner

## 2018-09-21 ENCOUNTER — Ambulatory Visit (INDEPENDENT_AMBULATORY_CARE_PROVIDER_SITE_OTHER): Payer: Medicare Other | Admitting: Nurse Practitioner

## 2018-09-21 VITALS — BP 136/68 | HR 42 | Temp 97.2°F | Ht 59.0 in | Wt 144.0 lb

## 2018-09-21 DIAGNOSIS — I63412 Cerebral infarction due to embolism of left middle cerebral artery: Secondary | ICD-10-CM | POA: Diagnosis not present

## 2018-09-21 DIAGNOSIS — F482 Pseudobulbar affect: Secondary | ICD-10-CM | POA: Diagnosis not present

## 2018-09-21 DIAGNOSIS — I10 Essential (primary) hypertension: Secondary | ICD-10-CM

## 2018-09-21 DIAGNOSIS — Z6830 Body mass index (BMI) 30.0-30.9, adult: Secondary | ICD-10-CM

## 2018-09-21 DIAGNOSIS — E785 Hyperlipidemia, unspecified: Secondary | ICD-10-CM

## 2018-09-21 DIAGNOSIS — I69391 Dysphagia following cerebral infarction: Secondary | ICD-10-CM | POA: Diagnosis not present

## 2018-09-21 DIAGNOSIS — N183 Chronic kidney disease, stage 3 unspecified: Secondary | ICD-10-CM

## 2018-09-21 DIAGNOSIS — R001 Bradycardia, unspecified: Secondary | ICD-10-CM

## 2018-09-21 DIAGNOSIS — K219 Gastro-esophageal reflux disease without esophagitis: Secondary | ICD-10-CM

## 2018-09-21 DIAGNOSIS — I480 Paroxysmal atrial fibrillation: Secondary | ICD-10-CM

## 2018-09-21 DIAGNOSIS — I69351 Hemiplegia and hemiparesis following cerebral infarction affecting right dominant side: Secondary | ICD-10-CM | POA: Diagnosis not present

## 2018-09-21 DIAGNOSIS — E876 Hypokalemia: Secondary | ICD-10-CM | POA: Diagnosis not present

## 2018-09-21 DIAGNOSIS — M810 Age-related osteoporosis without current pathological fracture: Secondary | ICD-10-CM | POA: Diagnosis not present

## 2018-09-21 DIAGNOSIS — I6930 Unspecified sequelae of cerebral infarction: Secondary | ICD-10-CM

## 2018-09-21 MED ORDER — ATORVASTATIN CALCIUM 40 MG PO TABS: 40.0000 mg | ORAL_TABLET | Freq: Every day | ORAL | 1 refills | Status: DC

## 2018-09-21 MED ORDER — APIXABAN 5 MG PO TABS: 5.0000 mg | ORAL_TABLET | Freq: Two times a day (BID) | ORAL | 1 refills | Status: DC

## 2018-09-21 MED ORDER — SPIRONOLACTONE 25 MG PO TABS: 25.0000 mg | ORAL_TABLET | Freq: Every day | ORAL | 3 refills | Status: DC

## 2018-09-21 NOTE — Addendum Note (Signed)
Addended by: [name] on: 09/21/2018 05:07 PM   Modules accepted: Orders

## 2018-09-21 NOTE — Progress Notes (Addendum)
Subjective:    Patient ID: Brooke Chavez, female    DOB: May 18, 1941, 78 y.o.   MRN: [id number]   Chief Complaint: Medical Management of Chronic Issues   HPI: Patient come sin today accompanied by her granddaughter. She had heart surgery last year and after that she had 2 strokes, she has right sided paralysis. She is able to ambulate with cane. She has PBA which makes her cry often but her granddaughter says that is better. She says she has not had as much trouble swallowing as she did. Speech has improved a lot. She is currently  Not doing physical therapy.   1. Cerebrovascular accident (CVA) due to embolism of left middle cerebral artery (Brooke Chavez)   2. Essential hypertension   3. Paroxysmal atrial fibrillation (HCC)   4. Gastroesophageal reflux disease without esophagitis   5. Dysphagia, post-stroke   6. 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 Age-related osteoporosis without current pathological fracture   8. Stage 3 chronic kidney disease (Brooke Chavez)   9. PBA (pseudobulbar affect)   10. Hypokalemia   11. Bradycardia   12. BMI 30.0-30.9,adult     Outpatient Encounter Medications as of 09/21/2018  Medication Sig  . acetaminophen (TYLENOL) 325 MG tablet Take 2 tablets (650 mg total) by mouth every 6 (six) hours as needed for mild pain (or Fever >/= 101).  Marland Kitchen apixaban (ELIQUIS) 5 MG TABS tablet Take 1 tablet (5 mg total) by mouth 2 (two) times daily.  Marland Kitchen atorvastatin (LIPITOR) 40 MG tablet Take 1 tablet (40 mg total) by mouth at bedtime.  Marland Kitchen Dextromethorphan-quiNIDine (NUEDEXTA) 20-10 MG CAPS Take 1 capsule by mouth daily.  Marland Kitchen spironolactone (ALDACTONE) 25 MG tablet Take 1 tablet (25 mg total) by mouth daily.       New complaints: None today  Social history: She lives with her daughter. They do not leave her alone at home.   Review of Systems  Constitutional: Negative for activity change and appetite change.  HENT: Negative.   Eyes:  Negative for pain.  Respiratory: Negative for shortness of breath.   Cardiovascular: Negative for chest pain, palpitations and leg swelling.  Gastrointestinal: Negative for abdominal pain.  Endocrine: Negative for polydipsia.  Genitourinary: Negative.   Skin: Negative for rash.  Neurological: Negative for dizziness, weakness and headaches.  Hematological: Does not bruise/bleed easily.  Psychiatric/Behavioral: Negative.   All other systems reviewed and are negative.      Objective:   Physical Exam Vitals signs and nursing note reviewed.  Constitutional:      General: She is not in acute distress.    Appearance: Normal appearance. She is well-developed and normal weight.  HENT:     Head: Normocephalic.     Nose: Nose normal.  Eyes:     Pupils: Pupils are equal, round, and reactive to light.  Neck:     Musculoskeletal: Normal range of motion and neck supple.     Vascular: No carotid bruit or JVD.  Cardiovascular:     Rate and Rhythm: Normal rate and regular rhythm.     Heart sounds: Normal heart sounds.  Pulmonary:     Effort: Pulmonary effort is normal. No respiratory distress.     Breath sounds: Normal breath sounds. No wheezing or rales.  Chest:     Chest wall: No tenderness.  Abdominal:     General: Bowel sounds are normal. There is no distension or abdominal bruit.     Palpations:  Abdomen is soft. There is no hepatomegaly, splenomegaly, mass or pulsatile mass.     Tenderness: There is no abdominal tenderness.  Musculoskeletal: Normal range of motion.     Comments: Complete paralysis of right arm- abe to ambulate with right leg as long as she uses a cane.  Lymphadenopathy:     Cervical: No cervical adenopathy.  Skin:    General: Skin is warm and dry.  Neurological:     General: No focal deficit present.     Mental Status: She is alert and oriented to person, place, and time.     Deep Tendon Reflexes: Reflexes are normal and symmetric.     Comments: speech much  clearer today then it has been.  No crying today Good eye contact Answers all questions appropriately  Psychiatric:        Mood and Affect: Mood normal.        Behavior: Behavior normal.        Thought Content: Thought content normal.        Judgment: Judgment normal.    BP 136/68   Pulse (!) 42   Temp (!) 97.2 F (36.2 C) (Oral)   Ht '4\' 11"'$  (1.499 m)   Wt 144 lb (65.3 kg)   LMP 11/05/1992   BMI 29.08 kg/m    EKG- bradycardia-Brooke Hassell Done, FNP      Assessment & Plan:  [name] in today with chief complaint of Medical Management of Chronic Issues   1. Cerebrovascular accident (CVA) due to embolism of left middle cerebral artery (Brooke Chavez)   2. Essential hypertension   3. Paroxysmal atrial fibrillation (HCC)   4. Gastroesophageal reflux disease without esophagitis   5. Dysphagia, post-stroke   6. 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 Age-related osteoporosis without current pathological fracture   8. Stage 3 chronic kidney disease (Brooke Chavez)   9. PBA (pseudobulbar affect)   10. Hypokalemia   11. Bradycardia   12. BMI 30.0-30.9,adult   13. Hyperlipidemia with target LDL less than 100   14. Late effect of cerebrovascular accident (CVA)    Patient is to continue all meds Labs pending Fall prevention discussed will send message to Dr. Warren Lacy about her heart rate and see if he wants to see her. Follow up in 3 months  Meds ordered this encounter  Medications  . apixaban (ELIQUIS) 5 MG TABS tablet    Sig: Take 1 tablet (5 mg total) by mouth 2 (two) times daily.    Dispense:  60 tablet    Refill:  1    Order Specific Question:   Supervising Provider    Answer:   Caryl Pina A [id number]  . atorvastatin (LIPITOR) 40 MG tablet    Sig: Take 1 tablet (40 mg total) by mouth at bedtime.    Dispense:  90 tablet    Refill:  1    Order Specific Question:   Supervising Provider    Answer:   Caryl Pina A  [id number]  . spironolactone (ALDACTONE) 25 MG tablet    Sig: Take 1 tablet (25 mg total) by mouth daily.    Dispense:  90 tablet    Refill:  3    Order Specific Question:   Supervising Provider    Answer:   Caryl Pina A [[id number]]   Orders Placed This Encounter  Procedures  . CMP14+EGFR  . Lipid panel  . Magnesium  . EKG 12-Lead    Brooke  Hassell Chavez, [place]

## 2018-09-21 NOTE — Patient Instructions (Signed)
Bradycardia, Adult Bradycardia is a slower-than-normal heartbeat. A normal resting heart rate for an adult ranges from 60 to 100 beats per minute. With bradycardia, the resting heart rate is less than 60 beats per minute. Bradycardia can prevent enough oxygen from reaching certain areas of your body when you are active. It can be serious if it keeps enough oxygen from reaching your brain and other parts of your body. Bradycardia is not a problem for everyone. For some healthy adults, a slow resting heart rate is normal. What are the causes? This condition may be caused by:  A problem with the heart, including: ? A problem with the heart's electrical system, such as a heart block. With a heart block, electrical signals between the chambers of the heart are partially or completely blocked, so they are not able to work as they should. ? A problem with the heart's natural pacemaker (sinus node). ? Heart disease. ? A heart attack. ? Heart damage. ? Lyme disease. ? A heart infection. ? A heart condition that is present at birth (congenital heart defect).  Certain medicines that treat heart conditions.  Certain conditions, such as hypothyroidism and obstructive sleep apnea.  Problems with the balance of chemicals and other substances, like potassium, in the blood.  Trauma.  Radiation therapy. What increases the risk? You are more likely to develop this condition if you:  Are age 65 or older.  Have high blood pressure (hypertension), high cholesterol (hyperlipidemia), or diabetes.  Drink heavily, use tobacco or nicotine products, or use drugs. What are the signs or symptoms? Symptoms of this condition include:  Light-headedness.  Feeling faint or fainting.  Fatigue and weakness.  Trouble with activity or exercise.  Shortness of breath.  Chest pain (angina).  Drowsiness.  Confusion.  Dizziness. How is this diagnosed? This condition may be diagnosed based on:  Your  symptoms.  Your medical history.  A physical exam. During the exam, your health care provider will listen to your heartbeat and check your pulse. To confirm the diagnosis, your health care provider may order tests, such as:  Blood tests.  An electrocardiogram (ECG). This test records the heart's electrical activity. The test can show how fast your heart is beating and whether the heartbeat is steady.  A test in which you wear a portable device (event recorder or Holter monitor) to record your heart's electrical activity while you go about your day.  Anexercise test. How is this treated? Treatment for this condition depends on the cause of the condition and how severe your symptoms are. Treatment may involve:  Treatment of the underlying condition.  Changing your medicines or how much medicine you take.  Having a small, battery-operated device called a pacemaker implanted under the skin. When bradycardia occurs, this device can be used to increase your heart rate and help your heart beat in a regular rhythm. Follow these instructions at home: Lifestyle   Manage any health conditions that contribute to bradycardia as told by your health care provider.  Follow a heart-healthy diet. A nutrition specialist (dietitian) can help educate you about healthy food options and changes.  Follow an exercise program that is approved by your health care provider.  Maintain a healthy weight.  Try to reduce or manage your stress, such as with yoga or meditation. If you need help reducing stress, ask your health care provider.  Do not use any products that contain nicotine or tobacco, such as cigarettes, e-cigarettes, and chewing tobacco. If you need help   quitting, ask your health care provider.  Do not use illegal drugs.  Limit alcohol intake to no more than 1 drink a day for nonpregnant women and 2 drinks a day for men. Be aware of how much alcohol is in your drink. In the [place] one drink  equals one 12 oz bottle of beer (355 mL), one 5 oz glass of wine (148 mL), or one 1 oz glass of hard liquor (44 mL). General instructions  Take over-the-counter and prescription medicines only as told by your health care provider.  Keep all follow-up visits as told by your health care provider. This is important. How is this prevented? In some cases, bradycardia may be prevented by:  Treating underlying medical problems.  Stopping behaviors or medicines that can trigger the condition. Contact a health care provider if you:  Feel light-headed or dizzy.  Almost faint.  Feel weak or are easily fatigued during physical activity.  Experience confusion or have memory problems. Get help right away if:  You faint.  You have: ? An irregular heartbeat (palpitations). ? Chest pain. ? Trouble breathing. Summary  Bradycardia is a slower-than-normal heartbeat. With bradycardia, the resting heart rate is less than 60 beats per minute.  Treatment for this condition depends on the cause.  Manage any health conditions that contribute to bradycardia as told by your health care provider.  Do not use any products that contain nicotine or tobacco, such as cigarettes, e-cigarettes, and chewing tobacco, and limit alcohol intake.  Keep all follow-up visits as told by your health care provider. This is important. This information is not intended to replace advice given to you by your health care provider. Make sure you discuss any questions you have with your health care provider. Document Released: 04/19/2002 Document Revised: 01/06/2018 Document Reviewed: 01/06/2018 Elsevier Interactive Patient Education  2019 [employer].  

## 2018-09-22 LAB — CMP14+EGFR
ALT: 12 IU/L (ref 0–32)
AST: 16 IU/L (ref 0–40)
Albumin/Globulin Ratio: 1.7 (ref 1.2–2.2)
Albumin: 4 g/dL (ref 3.7–4.7)
Alkaline Phosphatase: 83 IU/L (ref 39–117)
BUN/Creatinine Ratio: 13 (ref 12–28)
BUN: 16 mg/dL (ref 8–27)
Bilirubin Total: 0.4 mg/dL (ref 0.0–1.2)
CO2: 23 mmol/L (ref 20–29)
Calcium: 9.4 mg/dL (ref 8.7–10.3)
Chloride: 105 mmol/L (ref 96–106)
Creatinine, Ser: 1.25 mg/dL — ABNORMAL HIGH (ref 0.57–1.00)
GFR calc Af Amer: 48 mL/min/{1.73_m2} — ABNORMAL LOW (ref >59)
GFR calc non Af Amer: 41 mL/min/{1.73_m2} — ABNORMAL LOW (ref >59)
Globulin, Total: 2.3 g/dL (ref 1.5–4.5)
Glucose: 102 mg/dL — ABNORMAL HIGH (ref 65–99)
Potassium: 4.4 mmol/L (ref 3.5–5.2)
Sodium: 143 mmol/L (ref 134–144)
Total Protein: 6.3 g/dL (ref 6.0–8.5)

## 2018-09-22 LAB — LIPID PANEL
Chol/HDL Ratio: 2.6 ratio (ref 0.0–4.4)
Cholesterol, Total: 153 mg/dL (ref 100–199)
HDL: 60 mg/dL (ref >39)
LDL Calculated: 78 mg/dL (ref 0–99)
Triglycerides: 73 mg/dL (ref 0–149)
VLDL Cholesterol Cal: 15 mg/dL (ref 5–40)

## 2018-09-22 LAB — MAGNESIUM: Magnesium: 1.6 mg/dL (ref 1.6–2.3)

## 2018-09-29 ENCOUNTER — Telehealth: Payer: Self-pay | Admitting: Nurse Practitioner

## 2018-09-29 NOTE — Telephone Encounter (Signed)
Please review and advise.

## 2018-09-30 NOTE — Telephone Encounter (Signed)
Has she fallen on it. I would ice it and elevate unless she has fallen and injured it. And in that case will need to be seen

## 2018-09-30 NOTE — Telephone Encounter (Signed)
Daughter aware.

## 2018-10-05 ENCOUNTER — Telehealth: Payer: Self-pay | Admitting: Nurse Practitioner

## 2018-10-05 DIAGNOSIS — M79641 Pain in right hand: Secondary | ICD-10-CM | POA: Diagnosis not present

## 2018-10-05 DIAGNOSIS — M19041 Primary osteoarthritis, right hand: Secondary | ICD-10-CM | POA: Diagnosis not present

## 2018-10-12 DIAGNOSIS — Z029 Encounter for administrative examinations, unspecified: Secondary | ICD-10-CM

## 2018-11-17 ENCOUNTER — Telehealth: Payer: Self-pay | Admitting: *Deleted

## 2018-11-17 NOTE — Telephone Encounter (Signed)
   Cardiac Questionnaire:    Since your last visit or hospitalization:    1. Have you been having new or worsening chest pain? No   2. Have you been having new or worsening shortness of breath? No 3. Have you been having new or worsening leg swelling, wt gain, or increase in abdominal girth (pants fitting more tightly)? No   4. Have you had any passing out spells? No    *A YES to any of these questions would result in the appointment being kept. *If all the answers to these questions are NO, we should indicate that given the current situation regarding the worldwide coronarvirus pandemic, at the recommendation of the CDC, we are looking to limit gatherings in our waiting area, and thus will reschedule their appointment beyond four weeks from today.   _____________   [id number] Pre-Screening Questions:  . Do you currently have a fever? No (yes = cancel and refer to pcp for e-visit) . Have you recently travelled on a cruise, internationally, or to [place], [place], [place], [place], [place], or [place], [place] [employer]) ? No (yes = cancel, stay home, monitor symptoms, and contact pcp or initiate e-visit if symptoms develop) . Have you been in contact with someone that is currently pending confirmation of Covid19 testing or has been confirmed to have the [place] virus?  No(yes = cancel, stay home, away from tested individual, monitor symptoms, and contact pcp or initiate e-visit if symptoms develop) . Are you currently experiencing fatigue or cough? No (yes = pt should be prepared to have a mask placed at the time of their visit).    Questions answered by daughter [name].  Daughter feels it is best to reschedule appt with Dr Michelle Piper for now.  Appt rescheduled for 7/15 in the [place] office.  Daughter aware to c/b with any issues or concerns.

## 2018-11-24 ENCOUNTER — Ambulatory Visit: Payer: Medicare Other | Admitting: Cardiology

## 2018-11-24 ENCOUNTER — Telehealth: Payer: Self-pay | Admitting: *Deleted

## 2018-11-24 NOTE — Telephone Encounter (Signed)
Scheduled for CT scan Abd/Pelvis on 12/01/18 at [facility]. Daughter asking how long the scan could safely be delayed since she is not allowed to accompany her mother due to visitor restrictions. Since her stroke it is difficult for patient to communicate. Per Dr. Benay Spice: Her scan could be postponed 2-3 months safely. She would also like to cancel the office visit on 4/27 and reschedule when restrictions are lifted.

## 2018-11-29 ENCOUNTER — Encounter: Payer: Medicare Other | Attending: Physical Medicine & Rehabilitation

## 2018-11-29 ENCOUNTER — Ambulatory Visit (HOSPITAL_BASED_OUTPATIENT_CLINIC_OR_DEPARTMENT_OTHER): Payer: Medicare Other | Admitting: Physical Medicine & Rehabilitation

## 2018-11-29 ENCOUNTER — Other Ambulatory Visit: Payer: Self-pay

## 2018-11-29 ENCOUNTER — Encounter: Payer: Self-pay | Admitting: Physical Medicine & Rehabilitation

## 2018-11-29 VITALS — BP 152/74 | HR 40 | Ht 59.0 in | Wt 144.0 lb

## 2018-11-29 DIAGNOSIS — F482 Pseudobulbar affect: Secondary | ICD-10-CM | POA: Diagnosis not present

## 2018-11-29 DIAGNOSIS — I63412 Cerebral infarction due to embolism of left middle cerebral artery: Secondary | ICD-10-CM | POA: Diagnosis not present

## 2018-11-29 DIAGNOSIS — I69320 Aphasia following cerebral infarction: Secondary | ICD-10-CM

## 2018-11-29 DIAGNOSIS — I69351 Hemiplegia and hemiparesis following cerebral infarction affecting right dominant side: Secondary | ICD-10-CM

## 2018-11-29 DIAGNOSIS — I69319 Unspecified symptoms and signs involving cognitive functions following cerebral infarction: Secondary | ICD-10-CM | POA: Diagnosis not present

## 2018-11-29 NOTE — Progress Notes (Signed)
Subjective:    Patient ID: Brooke Chavez, female    DOB: May 25, 1941, 78 y.o.   MRN: [id number] Televisit with pt and daughter consent 12 min call HPI 61 year old right-handed female with complex medical history with mitral valve disease, atrial flutter status post mitral valve repair 01/07/2018 per Dr. Roxy Manns.  She was discharged on Coumadin 01/14/2018 as well as history of  hypertension, chronic diastolic congestive heart failure.  She presented to [facility] on 01/24/2018 after being found down by family with right-sided weakness and aphasia.  She lives with her daughter.  Reported to be independent prior to  mitral valve repair.  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  She was discharged to [facility] for ongoing care.   INR upon admission of 1.7.  CT angiogram of head and neck showed left proximal M1 occlusion with thrombus measuring 8 mm in length.  Interventional radiology consulted.  No plan for intervention due to high risk of bleeding.  Echocardiogram with  ejection fraction of 55%.  Systolic function normal.  No PFO.  MRI of the brain showed large left MCA territory infarction with petechial hemorrhage, old left cerebellar infarction.  Cardiology service is consulted.  No plan for TEE.  Neurology service  follow up with the patient had been on Coumadin therapy.  DATE OF ADMISSION:  01/27/2018    DATE OF DISCHARGE:  02/13/2018   Pt on eliquis and Lipitor for CVA prophyllaxis  Using Nuedexta for PBA symptoms  Pt lives with family.  Niece is anticipating return to work soon.  States there is a fax coming from [facility]  Pt walks with walker, needs assist with bathing but able to dress with supervision Needs help with meals Pt has cognitive deficits and requires supervision for safety No recent falls  Right arm/  hand pain in Feb seen by Urgent care who diagnosed Hand OA Pain Inventory Average Pain 0 Pain Right Now 0 My pain is no pain  In the last 24 hours, has pain interfered with the following? General activity 0 Relation with others 0 Enjoyment of life 0 What TIME of day is your pain at its worst? no pain Sleep (in general) Good  Pain is worse with: no pain Pain improves with: no pain Relief from Meds: no pain  Mobility walk with assistance use a cane how many minutes can you walk? 30 ability to climb steps?  yes do you drive?  no  Function retired  Neuro/Psych tingling trouble walking  Prior Studies Any changes since last visit?  no  Physicians involved in your care Any changes since last visit?  no   Family History  Problem Relation Age of Onset  . Hypertension Mother   . Cancer Sister        leukemia  . Diabetes Brother   . Stroke Sister   . Diabetes Brother   . Heart attack Brother   . Healthy Daughter   . Healthy Son   . Healthy Daughter    Social History   Socioeconomic History  . Marital status: Widowed    Spouse name: Not on file  . Number of children: 3  . Years of education: 42  . Highest education level: Not on file  Occupational History    Comment: na  Social Needs  . Financial resource strain: Not on file  . Food insecurity:  Worry: Not on file    Inability: Not on file  . Transportation needs:    Medical: Not on file    Non-medical: Not on file  Tobacco Use  . Smoking status: Never Smoker  . Smokeless tobacco: Never Used  Substance and Sexual Activity  . Alcohol use: No  . Drug use: No  . Sexual activity: Never  Lifestyle  . Physical activity:    Days per week: Not on file    Minutes per session: Not on file  . Stress: Not on file  Relationships  . Social connections:    Talks on phone: Not on file    Gets together: Not on file    Attends religious service: Not on file    Active member of club or organization: Not on  file    Attends meetings of clubs or organizations: Not on file    Relationship status: Not on file  Other Topics Concern  . Not on file  Social History Narrative   Lives in [place]   Her daughter lives with her   Little caffeine   Past Surgical History:  Procedure Laterality Date  . A FLUTTER ABLATION N/A 06/27/2011   Procedure: ABLATION A FLUTTER;  Surgeon: Thompson Grayer, MD;  Location: [facility] CATH LAB;  Service: Cardiovascular;  Laterality: N/A;  . ATRIAL ABLATION SURGERY  06/2010  . HIP SURGERY     Left (fracture) 3/13  . JOINT REPLACEMENT     both knees   . KNEE ARTHROSCOPY     Right  . LUMBAR SPINE SURGERY    . MITRAL VALVE REPAIR Right 01/07/2018   Procedure: MINIMALLY INVASIVE MITRAL VALVE REPAIR using LivaNova ring size 28 MM;  Surgeon: Rexene Alberts, MD;  Location: [place];  Service: Open Heart Surgery;  Laterality: Right;  . PATENT FORAMEN OVALE(PFO) CLOSURE N/A 01/07/2018   Procedure: PATENT FORAMEN OVALE (PFO) CLOSURE;  Surgeon: Rexene Alberts, MD;  Location: [place];  Service: Open Heart Surgery;  Laterality: N/A;  . RIGHT/LEFT HEART CATH AND CORONARY ANGIOGRAPHY N/A 11/11/2017   Procedure: RIGHT/LEFT HEART CATH AND CORONARY ANGIOGRAPHY;  Surgeon: Sherren Mocha, MD;  Location: [place] CV LAB;  Service: Cardiovascular;  Laterality: N/A;  . TEE WITHOUT CARDIOVERSION N/A 09/18/2017   Procedure: TRANSESOPHAGEAL ECHOCARDIOGRAM (TEE);  Surgeon: Skeet Latch, MD;  Location: [place];  Service: Cardiovascular;  Laterality: N/A;  . TEE WITHOUT CARDIOVERSION N/A 01/07/2018   Procedure: TRANSESOPHAGEAL ECHOCARDIOGRAM (TEE);  Surgeon: Rexene Alberts, MD;  Location: [place];  Service: Open Heart Surgery;  Laterality: N/A;  . TOTAL KNEE ARTHROPLASTY     Left  . TUBAL LIGATION     Past Medical History:  Diagnosis Date  . Atrial flutter ([place])    Typical by EKG diagnosis 9/11 s/p CIT ablation 11/11  . Bradycardia   . Chronic diastolic congestive heart failure ([place])   .  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 DJD (degenerative joint disease)   . Dysrhythmia   . Femur fracture, left ([place]) 2013  . Global aphasia   . Hyperlipidemia    x5 years  . Hypertension    Since 1997  . 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 Liver masses 11/13/2017   Multiple small nodules seen on CT and MRI  . Mitral regurgitation    severe  . 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 S/P minimally invasive mitral  valve repair 01/07/2018   Complex valvuloplasty including artificial Gore-tex neochord placement x6 and 28 mm Sorin Memo 4D ring annuloplasty via right mini thoracotomy approach  . Stroke ([place])   . TR (tricuspid regurgitation)    Mild with RA enlargment   LMP 11/05/1992   Opioid Risk Score:   Fall Risk Score:  `1  Depression screen PHQ 2/9  Depression screen [facility] 2/9 09/21/2018 08/17/2018 06/21/2018 03/05/2018 03/02/2018 11/03/2017 05/19/2017  Decreased Interest 0 0 0 0 2 0 0  Down, Depressed, Hopeless 0 0 0 0 2 0 0  PHQ - 2 Score 0 0 0 0 4 0 0  Altered sleeping - - - 0 3 - -  Tired, decreased energy - - - 0 3 - -  Change in appetite - - - 0 2 - -  Feeling bad or failure about yourself  - - - 0 1 - -  Trouble concentrating - - - 0 1 - -  Moving slowly or fidgety/restless - - - 0 2 - -  Suicidal thoughts - - - 0 0 - -  PHQ-9 Score - - - 0 16 - -  Some recent data might be hidden   Review of Systems  Constitutional: Negative.   HENT: Negative.   Eyes: Negative.   Respiratory: Negative.   Cardiovascular: Negative.   Gastrointestinal: Negative.   Endocrine: Negative.   Genitourinary: Negative.   Musculoskeletal: Positive for gait problem.  Skin: Negative.   Allergic/Immunologic: Negative.   Psychiatric/Behavioral: Negative.   All other systems reviewed and are negative.      Objective:   Physical Exam  Speech dysarthric Pt alternated laughter with on e episode of crying  Cognition impaired, niece answers most questions, pt with slow and incomplete responses, loses concentration   Remainder of exam deferred due to [place]:  1.  Left MCA infarct in June 2019 with residual gait disorder, cognitive and speech deficit and Pseudobulbar affect  Overall has done well with recovery but still needs 24/7 supervision with minA for bathing  Would endorse need for caregiver around the clock  Cont Nuedexta 20-10 daily for PBA  F/u with PCP and Neuro  RTC in 3 mo

## 2018-11-30 ENCOUNTER — Ambulatory Visit (HOSPITAL_COMMUNITY): Payer: Medicare Other

## 2018-11-30 ENCOUNTER — Other Ambulatory Visit: Payer: Medicare Other

## 2018-11-30 ENCOUNTER — Telehealth: Payer: Self-pay | Admitting: Oncology

## 2018-11-30 NOTE — Telephone Encounter (Signed)
Faxed medical records to Johnn Hai with [place]. Release ID# [id number]

## 2018-12-01 ENCOUNTER — Ambulatory Visit (HOSPITAL_COMMUNITY): Payer: Medicare Other

## 2018-12-01 ENCOUNTER — Other Ambulatory Visit: Payer: Medicare Other

## 2018-12-01 ENCOUNTER — Ambulatory Visit (HOSPITAL_COMMUNITY): Admission: RE | Admit: 2018-12-01 | Payer: Medicare Other | Source: Ambulatory Visit

## 2018-12-06 ENCOUNTER — Ambulatory Visit: Payer: Medicare Other | Admitting: Nurse Practitioner

## 2018-12-08 ENCOUNTER — Telehealth: Payer: Self-pay | Admitting: Cardiology

## 2018-12-08 NOTE — Telephone Encounter (Signed)
The patient verbally consented for a telehealth phone visit with [facility] and understands that his/her insurance company will be billed for the encounter. °  °Asked to have weight & vitals ready prior to telephone call.   °

## 2018-12-19 NOTE — Progress Notes (Signed)
Virtual Visit via Telephone Note   This visit type was conducted due to national recommendations for restrictions regarding the COVID-19 Pandemic (e.g. social distancing) in an effort to limit this patient's exposure and mitigate transmission in our community.  Due to her co-morbid illnesses, this patient is at least at moderate risk for complications without adequate follow up.  This format is felt to be most appropriate for this patient at this time.  The patient did not have access to video technology/had technical difficulties with video requiring transitioning to audio format only (telephone).  All issues noted in this document were discussed and addressed.  No physical exam could be performed with this format.  Please refer to the patient's chart for her  consent to telehealth for [facility].   Date:  12/20/2018   ID:  Brooke Chavez 1941/04/29, MRN [id number]  Patient Location: Home Provider Location: Home  PCP:  [name], FNP  Cardiologist:  [name], MD  Electrophysiologist:  None   Evaluation Performed:  Follow-Up Visit  Chief Complaint:  Atrial flutter  History of Present Illness:    Brooke Chavez is a 78 y.o. female up of atrial flutter.   She is s/p ablation and redo ablation.   Post procedure she had some paroxysms of probable fibrillation though these were a very brief period.  She has had lots of atrial ectopy and runs of atrial tachycardia. However, she had no sustained tachyarrhythmias. She had some bradycardic episodes but no sustained bradycardia arrhythmias. There were some junctional beats.  We have managed her medically for this.  She had an echo late last year.  This demonstrated that the MR had increases to moderately severe.  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  The ejection fraction was also reduced 45-50%. I sent her for a TEE.  She had severe MR.  On 01/01/18 she underwent minimally invasive MV repair, MAZE, and closure of PFO. She  had been on Eliquis pre op, Coumadin was started post op. She was discharged 01/14/18. On 01/24/18 she presented to an outside hospital with a left brain stroke. Her INR on presentation was 1.7.  She returns for follow-up.   Since I last saw her she has done well.  The patient denies any new symptoms such as chest discomfort, neck or arm discomfort. There has been no new shortness of breath, PND or orthopnea. There have been no reported palpitations, presyncope or syncope.  She walks with a cane in the house.    The patient does not have symptoms concerning for COVID-19 infection (fever, chills, cough, or new shortness of breath).    Past Medical History:  Diagnosis Date  . Atrial flutter ([place])    Typical by EKG diagnosis 9/11 s/p CIT ablation 11/11  . Bradycardia   . Chronic diastolic congestive heart failure ([place])   . 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 DJD (degenerative joint disease)   . Dysrhythmia   . Femur fracture, left ([place]) 2013  . Global aphasia   . Hyperlipidemia    x5 years  . Hypertension    Since 1997  . 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 Liver masses 11/13/2017   Multiple small nodules seen on CT and MRI  . Mitral regurgitation    severe  . 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  Complex valvuloplasty including artificial Gore-tex neochord placement x6 and 28 mm Sorin Memo 4D ring annuloplasty via right mini thoracotomy approach  . Stroke ([place])   . TR (tricuspid regurgitation)    Mild with RA enlargment   Past Surgical History:  Procedure Laterality Date  . A FLUTTER ABLATION N/A 06/27/2011   Procedure: ABLATION A FLUTTER;  Surgeon: Thompson Grayer, MD;  [facility] C[facility] LAB;  Service: Cardiovascular;  Laterality: N/A;  . ATRIAL ABLATION SURGERY  06/2010  . HIP SURGERY     Left (fracture) 3/13  . JOINT REPLACEMENT      both knees   . KNEE ARTHROSCOPY     Right  . LUMBAR SPINE SURGERY    . MITRAL VALVE REPAIR Right 01/07/2018   Procedure: MINIMALLY INVASIVE MITRAL VALVE REPAIR using LivaNova ring size 28 MM;  Surgeon: Rexene Alberts, MD;  Location: [place];  Service: Open Heart Surgery;  Laterality: Right;  . PATENT FORAMEN OVALE(PFO) CLOSURE N/A 01/07/2018   Procedure: PATENT FORAMEN OVALE (PFO) CLOSURE;  Surgeon: Rexene Alberts, MD;  Location: [place];  Service: Open Heart Surgery;  Laterality: N/A;  . RIGHT/LEFT HEART CATH AND CORONARY ANGIOGRAPHY N/A 11/11/2017   Procedure: RIGHT/LEFT HEART CATH AND CORONARY ANGIOGRAPHY;  Surgeon: Sherren Mocha, MD;  Location: [place] CV LAB;  Service: Cardiovascular;  Laterality: N/A;  . TEE WITHOUT CARDIOVERSION N/A 09/18/2017   Procedure: TRANSESOPHAGEAL ECHOCARDIOGRAM (TEE);  Surgeon: Skeet Latch, MD;  Location: [place];  Service: Cardiovascular;  Laterality: N/A;  . TEE WITHOUT CARDIOVERSION N/A 01/07/2018   Procedure: TRANSESOPHAGEAL ECHOCARDIOGRAM (TEE);  Surgeon: Rexene Alberts, MD;  Location: [place];  Service: Open Heart Surgery;  Laterality: N/A;  . TOTAL KNEE ARTHROPLASTY     Left  . TUBAL LIGATION       Prior to Admission medications   Medication Sig Start Date End Date Taking? Authorizing Provider  acetaminophen (TYLENOL) 325 MG tablet Take 2 tablets (650 mg total) by mouth every 6 (six) hours as needed for mild pain (or Fever >/= 101). 02/12/18  Yes Angiulli, Lavon Paganini, PA-C  apixaban (ELIQUIS) 5 MG TABS tablet Take 1 tablet (5 mg total) by mouth 2 (two) times daily. 09/21/18  Yes Hassell Done, Mary-Margaret, FNP  atorvastatin (LIPITOR) 40 MG tablet Take 1 tablet (40 mg total) by mouth at bedtime. 09/21/18  Yes Martin, Mary-Margaret, FNP  Dextromethorphan-quiNIDine (NUEDEXTA) 20-10 MG CAPS Take 1 capsule by mouth daily. 07/29/18  Yes Kirsteins, Luanna Salk, MD  spironolactone (ALDACTONE) 25 MG tablet Take 1 tablet (25 mg total) by mouth daily. 09/21/18  Yes  Hassell Done, Mary-Margaret, FNP    Allergies:   Oxycodone-acetaminophen and Aspirin   Social History   Tobacco Use  . Smoking status: Never Smoker  . Smokeless tobacco: Never Used  Substance Use Topics  . Alcohol use: No  . Drug use: No     Family Hx: The patient's family history includes Cancer in her sister; Diabetes in her brother and brother; Healthy in her daughter, daughter, and son; Heart attack in her brother; Hypertension in her mother; Stroke in her sister.  ROS:   Please see the history of present illness.    As stated in the HPI and negative for all other systems.   Prior CV studies:   The following studies were reviewed today:    Labs/Other Tests and Data Reviewed:    EKG:  No ECG reviewed.  Recent Labs: 03/13/2018: Hemoglobin 14.3; Platelets 214 09/21/2018: ALT 12; BUN 16; Creatinine, Ser  1.25; Magnesium 1.6; Potassium 4.4; Sodium 143   Recent Lipid Panel Lab Results  Component Value Date/Time   CHOL 153 09/21/2018 05:08 PM   TRIG 73 09/21/2018 05:08 PM   TRIG 58 09/19/2014 09:38 AM   HDL 60 09/21/2018 05:08 PM   HDL 67 09/19/2014 09:38 AM   CHOLHDL 2.6 09/21/2018 05:08 PM   CHOLHDL 3.0 01/25/2018 06:18 AM   LDLCALC 78 09/21/2018 05:08 PM   LDLCALC 83 11/21/2013 08:30 AM    Wt Readings from Last 3 Encounters:  12/20/18 130 lb (59 kg)  11/29/18 144 lb (65.3 kg)  09/21/18 144 lb (65.3 kg)     Objective:    Vital Signs:  BP (!) 128/101   Pulse (!) 54   Ht 5\' 2"  (1.575 m)   Wt 130 lb (59 kg)   LMP 11/05/1992   BMI 23.78 kg/m    VITAL SIGNS:  reviewed  ASSESSMENT & PLAN:    Atrial flutter -  She does not feel this.  I do not see recent CBC and she should get this tested.  I talked to her daughter about this and I will order this but she probably will be having an office visit and if not she should have a separate lab test ordered with a CBC sometime this summer at the least.  Her daughter is aware.   MITRAL REGURGITATION -  No change in  therapy.  She is not having any symptoms.  I will follow this clinically.  HYPERTENSION -  The blood pressure is at target.  No change in therapy.   PANCREATIC MASS - She has a slow-growing pancreatic mass which is being followed and she has a follow-up CT scheduled.  COVID-19 Education: The signs and symptoms of COVID-19 were discussed with the patient and how to seek care for testing (follow up with PCP or arrange E-visit).  The importance of social distancing was discussed today.  Time:   Today, I have spent 16 minutes with the patient with telehealth technology discussing the above problems.     Medication Adjustments/Labs and Tests Ordered: Current medicines are reviewed at length with the patient today.  Concerns regarding medicines are outlined above.   Tests Ordered: No orders of the defined types were placed in this encounter.   Medication Changes: No orders of the defined types were placed in this encounter.   Disposition:  Follow up with me six months in the office.   Signed, [name], MD  12/20/2018 1:47 PM    [facility]

## 2018-12-20 ENCOUNTER — Encounter: Payer: Self-pay | Admitting: Cardiology

## 2018-12-20 ENCOUNTER — Telehealth (INDEPENDENT_AMBULATORY_CARE_PROVIDER_SITE_OTHER): Payer: Medicare Other | Admitting: Cardiology

## 2018-12-20 VITALS — BP 128/101 | HR 54 | Ht 62.0 in | Wt 130.0 lb

## 2018-12-20 DIAGNOSIS — I480 Paroxysmal atrial fibrillation: Secondary | ICD-10-CM

## 2018-12-20 DIAGNOSIS — U071 COVID-19: Secondary | ICD-10-CM

## 2018-12-20 DIAGNOSIS — I10 Essential (primary) hypertension: Secondary | ICD-10-CM

## 2018-12-20 DIAGNOSIS — Z9889 Other specified postprocedural states: Secondary | ICD-10-CM | POA: Diagnosis not present

## 2018-12-20 DIAGNOSIS — I63512 Cerebral infarction due to unspecified occlusion or stenosis of left middle cerebral artery: Secondary | ICD-10-CM | POA: Diagnosis not present

## 2018-12-20 NOTE — Patient Instructions (Signed)
Your physician wants you to follow-up in: [place] will receive a reminder letter in the mail two months in advance. If you don't receive a letter, please call our office to schedule the follow-up appointment.  Your physician recommends that you continue on your current medications as directed. Please refer to the Current Medication list given to you today.  Thank you for choosing [facility]!!

## 2018-12-27 ENCOUNTER — Other Ambulatory Visit: Payer: Self-pay

## 2018-12-27 ENCOUNTER — Telehealth (INDEPENDENT_AMBULATORY_CARE_PROVIDER_SITE_OTHER): Payer: Medicare Other | Admitting: Thoracic Surgery (Cardiothoracic Vascular Surgery)

## 2018-12-27 DIAGNOSIS — Z9889 Other specified postprocedural states: Secondary | ICD-10-CM | POA: Diagnosis not present

## 2018-12-27 NOTE — Patient Instructions (Signed)

## 2018-12-27 NOTE — Progress Notes (Signed)
[address]       [place] [address]             [phone]     CARDIOTHORACIC SURGERY TELEPHONE VIRTUAL OFFICE NOTE  Primary Cardiologist is [name], MD PCP is [name], FNP   HPI:  I spoke with Brooke Chavez (DOB 1941-05-20 ) via telephone on 12/27/2018 at 3:49 PM and verified that I was speaking with the correct person using more than one form of identification.  We discussed the reason(s) for conducting our visit virtually instead of in-person.  The patient expressed understanding the circumstances and agreed to proceed as described.  Patient is a 78 year old African-American female with history of mitral regurgitation, atrial flutter status post ablation, hypertension, and degenerative joint disease who underwent minimally invasive mitral valve repair and closure of patent foramen ovale on Jan 07, 2018.  Her early postoperative recovery was uneventful and she was discharged home in sinus rhythm on the seventh postoperative day.  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  She was anticoagulated using warfarin with INR 1.8 at the time of her stroke.  Follow-up echocardiogram performed at that time revealed normal left ventricular systolic function with ejection fraction estimated 50 to 55%.  Mitral valve repair appeared intact with minimal residual mitral regurgitation.  Her subsequent recovery was slow.  She was last seen here in our office on April 26, 2018 at which time she was making satisfactory progress.   Since then she has been followed regularly by Dr. Letta Pate in the rehab department and Dr. Percival Spanish who spoke with her over the telephone last week.  I spoke with the patient and her daughter over the telephone today for routine follow-up.  The patient reports that she is getting along well.  She apparently is walking fairly well with minimal difficulty.  She still has residual weakness  and discomfort in her right arm but this continues to improve.  She has no shortness of breath.  She remains anticoagulated using Eliquis.  She denies any fevers, cough, or shortness of breath.  She has been practicing social distancing and staying at home pretty much all the time.   Current Outpatient Medications  Medication Sig Dispense Refill  . acetaminophen (TYLENOL) 325 MG tablet Take 2 tablets (650 mg total) by mouth every 6 (six) hours as needed for mild pain (or Fever >/= 101).    Marland Kitchen apixaban (ELIQUIS) 5 MG TABS tablet Take 1 tablet (5 mg total) by mouth 2 (two) times daily. 60 tablet 1  . atorvastatin (LIPITOR) 40 MG tablet Take 1 tablet (40 mg total) by mouth at bedtime. 90 tablet 1  . Dextromethorphan-quiNIDine (NUEDEXTA) 20-10 MG CAPS Take 1 capsule by mouth daily. 60 capsule 3  . spironolactone (ALDACTONE) 25 MG tablet Take 1 tablet (25 mg total) by mouth daily. 90 tablet 3   No current facility-administered medications for this visit.      Diagnostic Tests:  n/a   Impression:  Patient seems to be doing quite well approximately 1 year status post minimally invasive mitral valve repair complicated by embolic stroke approximately 3 weeks postoperatively, presumably secondary to atrial fibrillation or atrial flutter.    Plan:  We have not recommended any change to the patient's current medications.  I have reminded the patient and her daughter regarding the importance of dental hygiene and the lifelong need for antibiotic prophylaxis for all dental cleanings and  other related invasive procedures.  All the questions have been answered.  She will continue to follow-up regularly with Dr. Percival Spanish.  In the future she will call and return to see [place] should specific problems or questions arise.     I discussed limitations of evaluation and management via telephone.  The patient was advised to call back for repeat telephone consultation or to seek an in-person evaluation if  questions arise or the patient's clinical condition changes in any significant manner.  I spent in excess of 10 minutes of non-face-to-face time during the conduct of this telephone virtual office consultation.     Valentina Gu. Roxy Manns, MD 12/27/2018 3:49 PM

## 2018-12-29 ENCOUNTER — Telehealth: Payer: Self-pay | Admitting: Oncology

## 2018-12-29 NOTE — Telephone Encounter (Signed)
Scheduled appt per sch msg. Called and spoke with daughter. Confirmed date and time

## 2018-12-30 ENCOUNTER — Telehealth: Payer: Self-pay

## 2018-12-30 NOTE — Telephone Encounter (Signed)
I called [name] pts daughter that provider will not be here next week. Pt was r/s with Dr. Leonie Man for a video visit due to [place] 19 pandemic. I receive verbal consent to do video and to file insurance. I explained the doxy process and confirmed her email of [email or web address]. She will be doing the video visit with her lap top. I explained email will be sent the day before. [name] verbalized understanding.

## 2019-01-05 ENCOUNTER — Ambulatory Visit: Payer: Medicare Other | Admitting: Adult Health

## 2019-01-10 ENCOUNTER — Telehealth: Payer: Self-pay | Admitting: *Deleted

## 2019-01-10 ENCOUNTER — Ambulatory Visit (HOSPITAL_COMMUNITY): Admission: RE | Admit: 2019-01-10 | Payer: Medicare Other | Source: Ambulatory Visit

## 2019-01-10 ENCOUNTER — Inpatient Hospital Stay: Payer: Medicare Other

## 2019-01-10 NOTE — Telephone Encounter (Signed)
Daughter Brooke Chavez called wanting to reschedule CT scan - was scheduled for this am  01/10/19.  Brooke Chavez would like to schedule scans for whenever pt is allowed to have family with her. [facility]   Phone     [phone].

## 2019-01-11 ENCOUNTER — Encounter: Payer: Self-pay | Admitting: Neurology

## 2019-01-11 ENCOUNTER — Other Ambulatory Visit: Payer: Self-pay

## 2019-01-11 ENCOUNTER — Ambulatory Visit (INDEPENDENT_AMBULATORY_CARE_PROVIDER_SITE_OTHER): Payer: Medicare Other | Admitting: Neurology

## 2019-01-11 DIAGNOSIS — I63512 Cerebral infarction due to unspecified occlusion or stenosis of left middle cerebral artery: Secondary | ICD-10-CM

## 2019-01-11 DIAGNOSIS — E785 Hyperlipidemia, unspecified: Secondary | ICD-10-CM

## 2019-01-11 DIAGNOSIS — I6529 Occlusion and stenosis of unspecified carotid artery: Secondary | ICD-10-CM | POA: Diagnosis not present

## 2019-01-11 DIAGNOSIS — I6990 Unspecified sequelae of unspecified cerebrovascular disease: Secondary | ICD-10-CM

## 2019-01-11 MED ORDER — ATORVASTATIN CALCIUM 40 MG PO TABS: 60.0000 mg | ORAL_TABLET | Freq: Every day | ORAL | 1 refills | Status: DC

## 2019-01-11 NOTE — Telephone Encounter (Signed)
Email sent to [place] today at 839am.

## 2019-01-11 NOTE — Progress Notes (Signed)
Virtual Visit via Video Note  I connected with Brooke Chavez on 01/11/19 at  2:00 PM EDT by a video enabled telemedicine application and verified that I am speaking with the correct person using two identifiers.  Location: Patient:  At her home with daughter Provider: at [facility] office  I discussed the limitations of evaluation and management by telemedicine and the availability of in person appointments. The patient expressed understanding and agreed to proceed.  This visit was performed using [email or web address] app for audio and video.  The patient's daughter was present throughout this visit and facilitated it.  History of Present Illness: Ms. [name] is seen today for virtual video follow-up visit following last office visit on 07/07/2018.  The daughter states that the patient is doing well.  She is now able to speak a few words and occasional short sentences.  Her comprehension is a lot better than her spontaneous speech.  She is altered also obtain improvement in the right-sided strength.  She is now able to walk with a cane without any assistance.  She is fairly safe and has had no falls or injuries.  She remains on Eliquis which is tolerating well without bruising or bleeding.  She is on Lipitor 40 mg daily but had follow-up lipid profile checked in February 2020 when LDL cholesterol was 100 mg percent but primary physician did not increase the dose.  She is tolerating it well without muscle aches or pains.  Her blood pressure is under good control.  She has not had any follow-up carotid studies done for more than a year.   Observations/Objective: Physical and neurological exams are limited due to constraints of video visit.  Pleasant elderly African-American lady not in distress.  She is awake alert.  She has global expressive greater than receptive aphasia but is able to answer her name and speak occasional 2 or 3 word short sentences.  She can follow most commands well.  She has difficulty with naming and  repetition.  Extraocular movements appear full range without nystagmus.  She has mild right lower facial weakness when she smiles.  Tongue is midline.  Motor system exam showed mild right hemiparesis 4/5 strength with diminished fine finger movements on the right and orbits left over right upper extremity.  She is able to get up with minimum assistance and walks with a cane with slight dragging of the right leg.  She is able to turn with fair balance.  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  She has residual global aphasia and mild right hemiparesis but appears to have shown some improvement over the last 6 months.  Vascular risk factors of atrial flutter, hypertension, hyperlipidemia. Plan:I had a long discussion with the patient and daughter regarding her embolic stroke and residual aphasia and mild hemiparesis and answered questions.  Continue Eliquis for secondary stroke prevention with strict control of hypertension with blood pressure goal below 130/90 and lipids with LDL cholesterol goal below 70 mg percent.  Increase Lipitor dose to 60 mg daily.  Check screening follow-up carotid ultrasound study.  Return for follow-up in the future in 6 months with Janett Billow my nurse practitioner call earlier if necessary. Follow Up Instructions: Increase Lipitor dose to 60 mg daily.   Check screening follow-up carotid ultrasound study.   Return for follow-up in the future in 6 months with  Janett Billow my nurse practitioner call earlier if necessary.    I discussed the assessment and treatment plan with the patient. The patient was provided an opportunity to ask questions and all were answered. The patient agreed with the plan and demonstrated an understanding of the instructions.   The patient was advised to call back or seek an in-person evaluation  if the symptoms worsen or if the condition fails to improve as anticipated.  I provided 25 minutes of non-face-to-face time during this encounter.   Antony Contras, MD

## 2019-01-11 NOTE — Telephone Encounter (Signed)
Okay to reschedule CT and office visit for 2 months

## 2019-01-19 ENCOUNTER — Encounter (HOSPITAL_COMMUNITY): Payer: Medicare Other

## 2019-01-20 ENCOUNTER — Ambulatory Visit (HOSPITAL_COMMUNITY)
Admission: RE | Admit: 2019-01-20 | Discharge: 2019-01-20 | Disposition: A | Discharge status: Home / Self Care | Payer: Medicare Other | Source: Ambulatory Visit | Attending: Neurology | Admitting: Neurology

## 2019-01-20 ENCOUNTER — Other Ambulatory Visit: Payer: Self-pay

## 2019-01-20 DIAGNOSIS — I6529 Occlusion and stenosis of unspecified carotid artery: Secondary | ICD-10-CM | POA: Diagnosis not present

## 2019-01-20 DIAGNOSIS — E785 Hyperlipidemia, unspecified: Secondary | ICD-10-CM | POA: Diagnosis not present

## 2019-01-20 NOTE — Progress Notes (Signed)
Carotid duplex has been completed.   Preliminary results in CV Proc.   Brooke Chavez 01/20/2019 1:48 PM

## 2019-02-18 ENCOUNTER — Other Ambulatory Visit: Payer: Self-pay

## 2019-02-18 ENCOUNTER — Encounter: Payer: Medicare Other | Attending: Physical Medicine & Rehabilitation | Admitting: Physical Medicine & Rehabilitation

## 2019-02-18 DIAGNOSIS — I4892 Unspecified atrial flutter: Secondary | ICD-10-CM | POA: Diagnosis not present

## 2019-02-18 DIAGNOSIS — I63512 Cerebral infarction due to unspecified occlusion or stenosis of left middle cerebral artery: Secondary | ICD-10-CM | POA: Diagnosis not present

## 2019-02-18 DIAGNOSIS — Z79899 Other long term (current) drug therapy: Secondary | ICD-10-CM | POA: Diagnosis not present

## 2019-02-18 DIAGNOSIS — E46 Unspecified protein-calorie malnutrition: Secondary | ICD-10-CM

## 2019-02-18 DIAGNOSIS — I5032 Chronic diastolic (congestive) heart failure: Secondary | ICD-10-CM

## 2019-02-18 DIAGNOSIS — I7409 Other arterial embolism and thrombosis of abdominal aorta: Secondary | ICD-10-CM | POA: Diagnosis not present

## 2019-02-18 DIAGNOSIS — I110 Hypertensive heart disease with heart failure: Secondary | ICD-10-CM | POA: Insufficient documentation

## 2019-02-18 DIAGNOSIS — I69320 Aphasia following cerebral infarction: Secondary | ICD-10-CM | POA: Diagnosis not present

## 2019-02-18 DIAGNOSIS — I69390 Apraxia following cerebral infarction: Secondary | ICD-10-CM | POA: Insufficient documentation

## 2019-02-18 DIAGNOSIS — E785 Hyperlipidemia, unspecified: Secondary | ICD-10-CM | POA: Diagnosis not present

## 2019-02-18 DIAGNOSIS — Z8249 Family history of ischemic heart disease and other diseases of the circulatory system: Secondary | ICD-10-CM | POA: Insufficient documentation

## 2019-02-18 DIAGNOSIS — F482 Pseudobulbar affect: Secondary | ICD-10-CM | POA: Diagnosis not present

## 2019-02-18 MED ORDER — TRAZODONE HCL 50 MG PO TABS: 50.0000 mg | ORAL_TABLET | Freq: Every day | ORAL | 1 refills | Status: DC

## 2019-02-18 NOTE — Progress Notes (Signed)
Subjective:    Patient ID: Brooke Chavez, female    DOB: 06-12-1941, 78 y.o.   MRN: [id number]  HPI  78 year old female with history of mitral valve disease and atrial flutter who underwent a mitral valve repair 01/07/2018 per Dr. Roxy Manns.  She presented to [facility]/24/2018 found down with right-sided weakness and aphasia.  CT showed large territory acute left MCA infarct without hemorrhage.  She was discharged to [facility].  The patient had additional cardiology work-up revealing a normal ejection fraction.  No PFO. She had completed both inpatient rehab as well as home health therapy  The patient has had on going problems with emotional lability mainly crying. PBA Was on Neudextra with excellent effect however very expensive and the family could not continue to afford paying for this. Had tried sertraline with only partial effect  Vilazodone was mentioned by the daughter as a potential treatment.  We discussed that trazodone is in the same classification and is used more frequently in the elderly population.  No falls   Remains aphasic   Patient is ambulatory without assistive device.  She is able to dress and bathe herself with exception of supervision Pain Inventory Average Pain 0 Pain Right Now 0 My pain is no pain  In the last 24 hours, has pain interfered with the following? General activity 0 Relation with others 0 Enjoyment of life 0 What TIME of day is your pain at its worst? no pain Sleep (in general) Good  Pain is worse with: no pain Pain improves with: no pain Relief from Meds: no pain  Mobility walk with assistance use a cane how many minutes can you walk? 30 ability to climb steps?  yes do you drive?  no  Function retired  Neuro/Psych No problems in this area  Prior Studies Any changes since last visit?  no  Physicians involved in your care Any changes since last visit?  no   Family History  Problem Relation Age of Onset  .  Hypertension Mother   . Cancer Sister        leukemia  . Diabetes Brother   . Stroke Sister   . Diabetes Brother   . Heart attack Brother   . Healthy Daughter   . Healthy Son   . Healthy Daughter    Social History   Socioeconomic History  . Marital status: Widowed    Spouse name: Not on file  . Number of children: 3  . Years of education: 60  . Highest education level: Not on file  Occupational History    Comment: na  Social Needs  . Financial resource strain: Not on file  . Food insecurity    Worry: Not on file    Inability: Not on file  . Transportation needs    Medical: Not on file    Non-medical: Not on file  Tobacco Use  . Smoking status: Never Smoker  . Smokeless tobacco: Never Used  Substance and Sexual Activity  . Alcohol use: No  . Drug use: No  . Sexual activity: Never  Lifestyle  . Physical activity    Days per week: Not on file    Minutes per session: Not on file  . Stress: Not on file  Relationships  . Social Herbalist on phone: Not on file    Gets together: Not on file    Attends religious service: Not on file    Active member of club or  organization: Not on file    Attends meetings of clubs or organizations: Not on file    Relationship status: Not on file  Other Topics Concern  . Not on file  Social History Narrative   Lives in [place]   Her daughter lives with her   Little caffeine   Past Surgical History:  Procedure Laterality Date  . A FLUTTER ABLATION N/A 06/27/2011   Procedure: ABLATION A FLUTTER;  Surgeon: Thompson Grayer, MD;  Location: [facility] CATH LAB;  Service: Cardiovascular;  Laterality: N/A;  . ATRIAL ABLATION SURGERY  06/2010  . HIP SURGERY     Left (fracture) 3/13  . JOINT REPLACEMENT     both knees   . KNEE ARTHROSCOPY     Right  . LUMBAR SPINE SURGERY    . MITRAL VALVE REPAIR Right 01/07/2018   Procedure: MINIMALLY INVASIVE MITRAL VALVE REPAIR using LivaNova ring size 28 MM;  Surgeon: Rexene Alberts, MD;   Location: [place];  Service: Open Heart Surgery;  Laterality: Right;  . PATENT FORAMEN OVALE(PFO) CLOSURE N/A 01/07/2018   Procedure: PATENT FORAMEN OVALE (PFO) CLOSURE;  Surgeon: Rexene Alberts, MD;  Location: [place];  Service: Open Heart Surgery;  Laterality: N/A;  . RIGHT/LEFT HEART CATH AND CORONARY ANGIOGRAPHY N/A 11/11/2017   Procedure: RIGHT/LEFT HEART CATH AND CORONARY ANGIOGRAPHY;  Surgeon: Sherren Mocha, MD;  Location: [place] CV LAB;  Service: Cardiovascular;  Laterality: N/A;  . TEE WITHOUT CARDIOVERSION N/A 09/18/2017   Procedure: TRANSESOPHAGEAL ECHOCARDIOGRAM (TEE);  Surgeon: Skeet Latch, MD;  Location: [place];  Service: Cardiovascular;  Laterality: N/A;  . TEE WITHOUT CARDIOVERSION N/A 01/07/2018   Procedure: TRANSESOPHAGEAL ECHOCARDIOGRAM (TEE);  Surgeon: Rexene Alberts, MD;  Location: [place];  Service: Open Heart Surgery;  Laterality: N/A;  . TOTAL KNEE ARTHROPLASTY     Left  . TUBAL LIGATION     Past Medical History:  Diagnosis Date  . Atrial flutter ([place])    Typical by EKG diagnosis 9/11 s/p CIT ablation 11/11  . Bradycardia   . Chronic diastolic congestive heart failure ([place])   . 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 DJD (degenerative joint disease)   . Dysrhythmia   . Femur fracture, left ([facility]) 2013  . Global aphasia   . Hyperlipidemia    x5 years  . Hypertension    Since 1997  . 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 Liver masses 11/13/2017   Multiple small nodules seen on CT and MRI  . Mitral regurgitation    severe  . 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 Stroke ([place])   . TR (tricuspid regurgitation)    Mild with RA enlargment   BP (!) 155/74    Pulse (!) 46   Temp (!) 97.5 F (36.4 C)   Ht 5\' 3"  (1.6 m)   Wt 151 lb 12.8 oz (68.9 kg)   LMP 11/05/1992   SpO2 95%   BMI 26.89 kg/m   Opioid Risk Score:   Fall Risk Score:  `1  Depression screen PHQ 2/9  Depression screen [facility] 2/9 09/21/2018 08/17/2018 06/21/2018 03/05/2018 03/02/2018 11/03/2017 05/19/2017  Decreased Interest 0 0 0 0 2 0 0  Down, Depressed, Hopeless 0  0 0 0 2 0 0  PHQ - 2 Score 0 0 0 0 4 0 0  Altered sleeping - - - 0 3 - -  Tired, decreased energy - - - 0 3 - -  Change in appetite - - - 0 2 - -  Feeling bad or failure about yourself  - - - 0 1 - -  Trouble concentrating - - - 0 1 - -  Moving slowly or fidgety/restless - - - 0 2 - -  Suicidal thoughts - - - 0 0 - -  PHQ-9 Score - - - 0 16 - -  Some recent data might be hidden    Review of Systems  Constitutional: Negative.   HENT: Negative.   Eyes: Negative.   Respiratory: Negative.   Cardiovascular: Negative.   Gastrointestinal: Negative.   Endocrine: Negative.   Genitourinary: Negative.   Musculoskeletal: Negative.   Skin: Negative.   Allergic/Immunologic: Negative.   Neurological: Negative.   Hematological: Negative.   Psychiatric/Behavioral: Negative.   All other systems reviewed and are negative.      Objective:   Physical Exam Vitals signs and nursing note reviewed.  Constitutional:      Appearance: Normal appearance.  HENT:     Head: Normocephalic and atraumatic.     Nose: Nose normal.  Eyes:     Extraocular Movements: Extraocular movements intact.     Conjunctiva/sclera: Conjunctivae normal.     Pupils: Pupils are equal, round, and reactive to light.  Neck:     Musculoskeletal: Normal range of motion.  Musculoskeletal: Normal range of motion.  Skin:    General: Skin is warm and dry.  Neurological:     General: No focal deficit present.     Mental Status: She is alert and oriented to person, place, and time.  Psychiatric:        Mood and Affect: Mood normal.        Behavior:  Behavior normal.   Motor strength is 5/5 bilateral deltoid bicep tricep grip hip flexion knee extension ankle dorsiflexion she does need some gestural cueing for lower extremity manual muscle testing.  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 Speech she communicates nonverbally.  She does not appear to be accurate with yes no responses.        Assessment & Plan:  1.  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  She has plateaued in terms of her physical functioning as well as her recovery from her aphasia. We discussed alternatives to her pseudo-bulbar affect pharmacologic treatment.  We will trial trazodone 50 mg nightly.  We may have to try 25 twice daily depending on her response. I will see her back in 3 months.  Family is to call if she is experiencing any difficulties such as excessive drowsiness from this medication.

## 2019-02-18 NOTE — Patient Instructions (Signed)
Trazodone tablets What is this medicine? TRAZODONE (TRAZ oh done) is used to treat depression. This medicine may be used for other purposes; ask your health care provider or pharmacist if you have questions. COMMON BRAND NAME(S): Desyrel What should I tell my health care provider before I take this medicine? They need to know if you have any of these conditions:  attempted suicide or thinking about it  bipolar disorder  bleeding problems  glaucoma  heart disease, or previous heart attack  irregular heart beat  kidney or liver disease  low levels of sodium in the blood  an unusual or allergic reaction to trazodone, other medicines, foods, dyes or preservatives  pregnant or trying to get pregnant  breast-feeding How should I use this medicine? Take this medicine by mouth with a glass of water. Follow the directions on the prescription label. Take this medicine shortly after a meal or a light snack. Take your medicine at regular intervals. Do not take your medicine more often than directed. Do not stop taking this medicine suddenly except upon the advice of your doctor. Stopping this medicine too quickly may cause serious side effects or your condition may worsen. A special MedGuide will be given to you by the pharmacist with each prescription and refill. Be sure to read this information carefully each time. Talk to your pediatrician regarding the use of this medicine in children. Special care may be needed. Overdosage: If you think you have taken too much of this medicine contact a poison control center or emergency room at once. NOTE: This medicine is only for you. Do not share this medicine with others. What if I miss a dose? If you miss a dose, take it as soon as you can. If it is almost time for your next dose, take only that dose. Do not take double or extra doses. What may interact with this medicine? Do not take this medicine with any of the following  medications:  certain medicines for fungal infections like fluconazole, itraconazole, ketoconazole, posaconazole, voriconazole  cisapride  dronedarone  linezolid  MAOIs like Carbex, Eldepryl, Marplan, Nardil, and Parnate  mesoridazine  methylene blue (injected into a vein)  pimozide  saquinavir  thioridazine This medicine may also interact with the following medications:  alcohol  antiviral medicines for HIV or AIDS  aspirin and aspirin-like medicines  barbiturates like phenobarbital  certain medicines for blood pressure, heart disease, irregular heart beat  certain medicines for depression, anxiety, or psychotic disturbances  certain medicines for migraine headache like almotriptan, eletriptan, frovatriptan, naratriptan, rizatriptan, sumatriptan, zolmitriptan  certain medicines for seizures like carbamazepine and phenytoin  certain medicines for sleep  certain medicines that treat or prevent blood clots like dalteparin, enoxaparin, warfarin  digoxin  fentanyl  lithium  NSAIDS, medicines for pain and inflammation, like ibuprofen or naproxen  other medicines that prolong the QT interval (cause an abnormal heart rhythm) like dofetilide  rasagiline  supplements like St. John's wort, kava kava, valerian  tramadol  tryptophan This list may not describe all possible interactions. Give your health care provider a list of all the medicines, herbs, non-prescription drugs, or dietary supplements you use. Also tell them if you smoke, drink alcohol, or use illegal drugs. Some items may interact with your medicine. What should I watch for while using this medicine? Tell your doctor if your symptoms do not get better or if they get worse. Visit your doctor or health care professional for regular checks on your progress. Because it may take   several weeks to see the full effects of this medicine, it is important to continue your treatment as prescribed by your  doctor. Patients and their families should watch out for new or worsening thoughts of suicide or depression. Also watch out for sudden changes in feelings such as feeling anxious, agitated, panicky, irritable, hostile, aggressive, impulsive, severely restless, overly excited and hyperactive, or not being able to sleep. If this happens, especially at the beginning of treatment or after a change in dose, call your health care professional. You may get drowsy or dizzy. Do not drive, use machinery, or do anything that needs mental alertness until you know how this medicine affects you. Do not stand or sit up quickly, especially if you are an older patient. This reduces the risk of dizzy or fainting spells. Alcohol may interfere with the effect of this medicine. Avoid alcoholic drinks. This medicine may cause dry eyes and blurred vision. If you wear contact lenses you may feel some discomfort. Lubricating drops may help. See your eye doctor if the problem does not go away or is severe. Your mouth may get dry. Chewing sugarless gum, sucking hard candy and drinking plenty of water may help. Contact your doctor if the problem does not go away or is severe. What side effects may I notice from receiving this medicine? Side effects that you should report to your doctor or health care professional as soon as possible:  allergic reactions like skin rash, itching or hives, swelling of the face, lips, or tongue  elevated mood, decreased need for sleep, racing thoughts, impulsive behavior  confusion  fast, irregular heartbeat  feeling faint or lightheaded, falls  feeling agitated, angry, or irritable  loss of balance or coordination  painful or prolonged erections  restlessness, pacing, inability to keep still  suicidal thoughts or other mood changes  tremors  trouble sleeping  seizures  unusual bleeding or bruising Side effects that usually do not require medical attention (report to your doctor  or health care professional if they continue or are bothersome):  change in sex drive or performance  change in appetite or weight  constipation  headache  muscle aches or pains  nausea This list may not describe all possible side effects. Call your doctor for medical advice about side effects. You may report side effects to FDA at 1-800-FDA-1088. Where should I keep my medicine? Keep out of the reach of children. Store at room temperature between 15 and 30 degrees C (59 to 86 degrees F). Protect from light. Keep container tightly closed. Throw away any unused medicine after the expiration date. NOTE: This sheet is a summary. It may not cover all possible information. If you have questions about this medicine, talk to your doctor, pharmacist, or health care provider.  2020 Elsevier/Gold Standard (2018-07-20 11:46:46)  

## 2019-02-21 ENCOUNTER — Telehealth: Payer: Self-pay | Admitting: *Deleted

## 2019-02-21 NOTE — Telephone Encounter (Signed)
    COVID-19 Pre-Screening Questions:  . In the past 7 to 10 days have you had a cough,  shortness of breath, headache, congestion, fever (100 or greater) body aches, chills, sore throat, or sudden loss of taste or sense of smell? . Have you been around anyone with known Covid 19. . Have you been around anyone who is awaiting Covid 19 test results in the past 7 to 10 days? . Have you been around anyone who has been exposed to Covid 19, or has mentioned symptoms of Covid 19 within the past 7 to 10 days?  If you have any concerns/questions about symptoms patients report during screening (either on the phone or at threshold). Contact the provider seeing the patient or DOD for further guidance.  If neither are available contact a member of the leadership team.          Spoke with Patients daughter Ms.Hassell Done. She state that her mother has no above symptoms. She will wear a mask and come only 15 min early. CP/cma

## 2019-02-22 NOTE — Progress Notes (Signed)
Cardiology Office Note   Date:  02/23/2019   ID:  Brooke Chavez, Brooke Chavez 21-Jun-1941, MRN [id number]  PCP:  [name], FNP  Cardiologist:   [name], MD   No chief complaint on file.     History of Present Illness: Brooke Chavez is a 78 y.o. female who presents for follow up of atrial flutter. She is s/p ablation and redo ablation. Post procedure she had some paroxysms of probable fibrillation though these were avery brief period. She has had lots of atrial ectopy and runs of atrial tachycardia. However, she had no sustained tachyarrhythmias. She had some bradycardic episodes but no sustained bradycardia arrhythmias. There were some junctional beats. We have managed her medically for this. She had severe MR. On5/24/19 she underwent minimally invasive MV repair, MAZE, and closure of PFO. She had been on Eliquis pre op, Coumadin was started post op. She was discharged 01/14/18. On 01/24/18 she presented to an outside hospital with a left brain stroke. Her INR on presentation was 1.7.    Since I last saw her she has done well.  She gets around slowly since her stroke.  The patient denies any new symptoms such as chest discomfort, neck or arm discomfort. There has been no new shortness of breath, PND or orthopnea. There have been no reported palpitations, presyncope or syncope.    Past Medical History:  Diagnosis Date  . Atrial flutter ([place])    Typical by EKG diagnosis 9/11 s/p CIT ablation 11/11  . Bradycardia   . Chronic diastolic congestive heart failure ([place])   . 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 DJD (degenerative joint disease)   . Dysrhythmia   . Femur fracture, left ([place]) 2013  . Global aphasia   . Hyperlipidemia    x5 years  . Hypertension    Since 1997  . 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 Liver masses 11/13/2017   Multiple small nodules seen on CT  and MRI  . Mitral regurgitation    severe  . 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 Stroke ([place])   . 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 A FLUTTER ABLATION N/A 06/27/2011   Procedure: ABLATION A FLUTTER;  Surgeon: Thompson Grayer, MD;  Location: [facility] CATH LAB;  Service: Cardiovascular;  Laterality: N/A;  . ATRIAL ABLATION SURGERY  06/2010  . HIP SURGERY     Left (fracture) 3/13  . JOINT REPLACEMENT     both knees   . KNEE ARTHROSCOPY     Right  . LUMBAR SPINE SURGERY    . MITRAL VALVE REPAIR Right 01/07/2018   Procedure: MINIMALLY INVASIVE MITRAL VALVE REPAIR using LivaNova ring size 28 MM;  Surgeon: Rexene Alberts, MD;  Location: [place];  Service: Open Heart Surgery;  Laterality: Right;  . PATENT FORAMEN OVALE(PFO) CLOSURE N/A 01/07/2018   Procedure: PATENT FORAMEN OVALE (PFO) CLOSURE;  Surgeon: Rexene Alberts, MD;  Location: [place];  Service: Open Heart Surgery;  Laterality: N/A;  . RIGHT/LEFT HEART CATH AND CORONARY ANGIOGRAPHY N/A 11/11/2017   Procedure: RIGHT/LEFT HEART CATH AND CORONARY ANGIOGRAPHY;  Surgeon:  Sherren Mocha, MD;  Location: [place] CV LAB;  Service: Cardiovascular;  Laterality: N/A;  . TEE WITHOUT CARDIOVERSION N/A 09/18/2017   Procedure: TRANSESOPHAGEAL ECHOCARDIOGRAM (TEE);  Surgeon: Skeet Latch, MD;  Location: [place];  Service: Cardiovascular;  Laterality: N/A;  . TEE WITHOUT CARDIOVERSION N/A 01/07/2018   Procedure: TRANSESOPHAGEAL ECHOCARDIOGRAM (TEE);  Surgeon: Rexene Alberts, MD;  Location: [place];  Service: Open Heart Surgery;  Laterality: N/A;  . TOTAL KNEE ARTHROPLASTY     Left  . TUBAL LIGATION       Current Outpatient Medications  Medication Sig Dispense Refill  .  acetaminophen (TYLENOL) 325 MG tablet Take 2 tablets (650 mg total) by mouth every 6 (six) hours as needed for mild pain (or Fever >/= 101).    Marland Kitchen apixaban (ELIQUIS) 5 MG TABS tablet Take 1 tablet (5 mg total) by mouth 2 (two) times daily. 60 tablet 1  . atorvastatin (LIPITOR) 40 MG tablet Take 1.5 tablets (60 mg total) by mouth at bedtime. 90 tablet 1  . spironolactone (ALDACTONE) 25 MG tablet Take 1 tablet (25 mg total) by mouth daily. 90 tablet 3  . traZODone (DESYREL) 50 MG tablet Take 1 tablet (50 mg total) by mouth at bedtime. 30 tablet 1   No current facility-administered medications for this visit.     Allergies:   Oxycodone-acetaminophen and Aspirin    ROS:  Please see the history of present illness.   Otherwise, review of systems are positive for none.   All other systems are reviewed and negative.    PHYSICAL EXAM: VS:  BP (!) 138/56   Pulse (!) 56   Ht 4\' 11"  (1.499 m)   Wt 153 lb (69.4 kg)   LMP 11/05/1992   BMI 30.90 kg/m  , BMI Body mass index is 30.9 kg/m. GENERAL:  Well appearing NECK:  No jugular venous distention, waveform within normal limits, carotid upstroke brisk and symmetric, no bruits, no thyromegaly LUNGS:  Clear to auscultation bilaterally CHEST:  Unremarkable HEART:  PMI not displaced or sustained,S1 and S2 within normal limits, no S3,  no clicks, no rubs, no murmurs ABD:  Flat, positive bowel sounds normal in frequency in pitch, no bruits, no rebound, no guarding, no midline pulsatile mass, no hepatomegaly, no splenomegaly EXT:  2 plus pulses throughout, no edema, no cyanosis no clubbing   EKG:  EKG is ordered today. The ekg ordered today demonstrates atrial fibrillation, rate 56, 2-second pauses interventricular conduction delay, left axis deviation, left anterior fascicular block   Recent Labs: 03/13/2018: Hemoglobin 14.3; Platelets 214 09/21/2018: ALT 12; BUN 16; Creatinine, Ser 1.25; Magnesium 1.6; Potassium 4.4; Sodium 143    Lipid Panel     Component Value Date/Time   CHOL 153 09/21/2018 1708   TRIG 73 09/21/2018 1708   TRIG 58 09/19/2014 0938   HDL 60 09/21/2018 1708   HDL 67 09/19/2014 0938   CHOLHDL 2.6 09/21/2018 1708   CHOLHDL 3.0 01/25/2018 0618   VLDL 18 01/25/2018 0618   LDLCALC 78 09/21/2018 1708   LDLCALC 83 11/21/2013 0830      Wt Readings from Last 3 Encounters:  02/23/19 153 lb (69.4 kg)  02/18/19 151 lb 12.8 oz (68.9 kg)  12/20/18 130 lb (59 kg)      Other studies Reviewed: Additional studies/ records that were reviewed today include: None. Review of the above records demonstrates:  Please see elsewhere in the note.     ASSESSMENT AND PLAN:  Atrial flutter -  Patient  tolerates anticoagulation.  No change in therapy is indicated.  I am going to check a CBC today.  MITRAL REGURGITATION -  She had some mild residual mitral regurgitation and some stenosis.    Will follow this clinically.   HYPERTENSION -  The blood pressure is no change in therapy.   BRADYCARDIA - She has no symptoms related to this and I talked about the fact that if she has any syncope I would need to know given his bradycardia arrhythmia.  Current medicines are reviewed at length with the patient today.  The patient does not have concerns regarding medicines.  The following changes have been made:  no change  Labs/ tests ordered today include:   Orders Placed This Encounter  Procedures  . CBC  . EKG 12-Lead     Disposition:   FU with me in one year.     Signed, [name], MD  02/23/2019 4:42 PM    [facility]

## 2019-02-23 ENCOUNTER — Encounter: Payer: Self-pay | Admitting: Cardiology

## 2019-02-23 ENCOUNTER — Ambulatory Visit (INDEPENDENT_AMBULATORY_CARE_PROVIDER_SITE_OTHER): Payer: Medicare Other | Admitting: Cardiology

## 2019-02-23 ENCOUNTER — Other Ambulatory Visit: Payer: Medicare Other

## 2019-02-23 ENCOUNTER — Other Ambulatory Visit: Payer: Self-pay

## 2019-02-23 VITALS — BP 138/56 | HR 56 | Ht 59.0 in | Wt 153.0 lb

## 2019-02-23 DIAGNOSIS — I63512 Cerebral infarction due to unspecified occlusion or stenosis of left middle cerebral artery: Secondary | ICD-10-CM | POA: Diagnosis not present

## 2019-02-23 DIAGNOSIS — I480 Paroxysmal atrial fibrillation: Secondary | ICD-10-CM

## 2019-02-23 DIAGNOSIS — Z5181 Encounter for therapeutic drug level monitoring: Secondary | ICD-10-CM

## 2019-02-23 NOTE — Patient Instructions (Signed)
Medication Instructions:  The current medical regimen is effective;  continue present plan and medications.  If you need a refill on your cardiac medications before your next appointment, please call your pharmacy.   Lab work: Please have blood work today  (CBC) If you have labs (blood work) drawn today and your tests are completely normal, you will receive your results only by: Marland Kitchen MyChart Message (if you have MyChart) OR . A paper copy in the mail If you have any lab test that is abnormal or we need to change your treatment, we will call you to review the results.  Follow-Up: Follow up in 1 year with Dr. Percival Spanish.  You will receive a letter in the mail 2 months before you are due.  Please call us when you receive this letter to schedule your follow up appointment.  Thank you for choosing [facility]!!

## 2019-02-24 LAB — CBC
Hematocrit: 40.6 % (ref 34.0–46.6)
Hemoglobin: 12.8 g/dL (ref 11.1–15.9)
MCH: 31 pg (ref 26.6–33.0)
MCHC: 31.5 g/dL (ref 31.5–35.7)
MCV: 98 fL — ABNORMAL HIGH (ref 79–97)
Platelets: 129 10*3/uL — ABNORMAL LOW (ref 150–450)
RBC: 4.13 x10E6/uL (ref 3.77–5.28)
RDW: 14.8 % (ref 11.7–15.4)
WBC: 5.1 10*3/uL (ref 3.4–10.8)

## 2019-02-25 ENCOUNTER — Ambulatory Visit: Payer: Medicare Other | Admitting: Physical Medicine & Rehabilitation

## 2019-02-28 ENCOUNTER — Ambulatory Visit: Payer: Medicare Other | Admitting: Physical Medicine & Rehabilitation

## 2019-03-22 ENCOUNTER — Other Ambulatory Visit: Payer: Self-pay | Admitting: Nurse Practitioner

## 2019-03-22 DIAGNOSIS — I63412 Cerebral infarction due to embolism of left middle cerebral artery: Secondary | ICD-10-CM

## 2019-04-19 ENCOUNTER — Other Ambulatory Visit: Payer: Self-pay | Admitting: Physical Medicine & Rehabilitation

## 2019-04-19 ENCOUNTER — Other Ambulatory Visit: Payer: Self-pay | Admitting: Nurse Practitioner

## 2019-04-19 DIAGNOSIS — I63412 Cerebral infarction due to embolism of left middle cerebral artery: Secondary | ICD-10-CM

## 2019-04-21 ENCOUNTER — Other Ambulatory Visit: Payer: Self-pay | Admitting: *Deleted

## 2019-04-21 DIAGNOSIS — E785 Hyperlipidemia, unspecified: Secondary | ICD-10-CM

## 2019-04-21 MED ORDER — ATORVASTATIN CALCIUM 40 MG PO TABS: 60.0000 mg | ORAL_TABLET | Freq: Every day | ORAL | 0 refills | Status: DC

## 2019-05-21 ENCOUNTER — Other Ambulatory Visit: Payer: Self-pay | Admitting: Nurse Practitioner

## 2019-05-21 ENCOUNTER — Other Ambulatory Visit: Payer: Self-pay | Admitting: Physical Medicine & Rehabilitation

## 2019-05-21 DIAGNOSIS — I63412 Cerebral infarction due to embolism of left middle cerebral artery: Secondary | ICD-10-CM

## 2019-05-30 ENCOUNTER — Encounter: Payer: Self-pay | Admitting: Registered Nurse

## 2019-05-30 ENCOUNTER — Encounter: Payer: Medicare Other | Admitting: Physical Medicine & Rehabilitation

## 2019-05-30 ENCOUNTER — Other Ambulatory Visit: Payer: Self-pay

## 2019-05-30 ENCOUNTER — Encounter: Payer: Medicare Other | Attending: Physical Medicine & Rehabilitation | Admitting: Registered Nurse

## 2019-05-30 VITALS — BP 174/97 | HR 60 | Temp 97.7°F | Ht <= 58 in | Wt 157.0 lb

## 2019-05-30 DIAGNOSIS — I63512 Cerebral infarction due to unspecified occlusion or stenosis of left middle cerebral artery: Secondary | ICD-10-CM | POA: Insufficient documentation

## 2019-05-30 DIAGNOSIS — I10 Essential (primary) hypertension: Secondary | ICD-10-CM | POA: Insufficient documentation

## 2019-05-30 DIAGNOSIS — I69320 Aphasia following cerebral infarction: Secondary | ICD-10-CM | POA: Insufficient documentation

## 2019-05-30 DIAGNOSIS — I69351 Hemiplegia and hemiparesis following cerebral infarction affecting right dominant side: Secondary | ICD-10-CM | POA: Diagnosis not present

## 2019-05-30 DIAGNOSIS — F482 Pseudobulbar affect: Secondary | ICD-10-CM | POA: Diagnosis not present

## 2019-05-30 NOTE — Patient Instructions (Signed)
Keep Blood Pressure Log: Call PCP with Blood Pressure readings.

## 2019-05-30 NOTE — Progress Notes (Signed)
Subjective:    Patient ID: Brooke Chavez, female    DOB: 19-Sep-1940, 78 y.o.   MRN: [id number]  HPI: Brooke Chavez is a 78 y.o. female who returns for follow up of her Left MCA infarct with aphasia and apraxia of speech. She nods her head to question about pain and states no pain.She rates her pain 0. current exercise regime is walking and performing stretching exercises.  Ms. Lower arrived to office hypertensive, blood pressure was rechecked. Daughter refuses Ed or Urgent Care evaluation. Instructed to keep blood pressure log and f/u with her PCP, she verbalizes understanding.  Daughter in room all questions answered.  Pain Inventory Average Pain 0 Pain Right Now 0 My pain is no pain  In the last 24 hours, has pain interfered with the following? General activity 0 Relation with others 0 Enjoyment of life 0 What TIME of day is your pain at its worst? no pain Sleep (in general) Good  Pain is worse with: no pain Pain improves with: no pain Relief from Meds: no pain  Mobility Do you have any goals in this area?  no  Function Do you have any goals in this area?  no  Neuro/Psych No problems in this area  Prior Studies Any changes since last visit?  no  Physicians involved in your care Any changes since last visit?  no   Family History  Problem Relation Age of Onset  . Hypertension Mother   . Cancer Sister        leukemia  . Diabetes Brother   . Stroke Sister   . Diabetes Brother   . Heart attack Brother   . Healthy Daughter   . Healthy Son   . Healthy Daughter    Social History   Socioeconomic History  . Marital status: Widowed    Spouse name: Not on file  . Number of children: 3  . Years of education: 45  . Highest education level: Not on file  Occupational History    Comment: na  Social Needs  . Financial resource strain: Not on file  . Food insecurity    Worry: Not on file    Inability: Not on file  . Transportation needs    Medical: Not on file     Non-medical: Not on file  Tobacco Use  . Smoking status: Never Smoker  . Smokeless tobacco: Never Used  Substance and Sexual Activity  . Alcohol use: No  . Drug use: No  . Sexual activity: Never  Lifestyle  . Physical activity    Days per week: Not on file    Minutes per session: Not on file  . Stress: Not on file  Relationships  . Social Herbalist on phone: Not on file    Gets together: Not on file    Attends religious service: Not on file    Active member of club or organization: Not on file    Attends meetings of clubs or organizations: Not on file    Relationship status: Not on file  Other Topics Concern  . Not on file  Social History Narrative   Lives in [place]   Her daughter lives with her   Little caffeine   Past Surgical History:  Procedure Laterality Date  . A FLUTTER ABLATION N/A 06/27/2011   Procedure: ABLATION A FLUTTER;  Surgeon: Thompson Grayer, MD;  Location: [facility] CATH LAB;  Service: Cardiovascular;  Laterality: N/A;  . ATRIAL ABLATION SURGERY  06/2010  .  HIP SURGERY     Left (fracture) 3/13  . JOINT REPLACEMENT     both knees   . KNEE ARTHROSCOPY     Right  . LUMBAR SPINE SURGERY    . MITRAL VALVE REPAIR Right 01/07/2018   Procedure: MINIMALLY INVASIVE MITRAL VALVE REPAIR using LivaNova ring size 28 MM;  Surgeon: Rexene Alberts, MD;  Location: [place];  Service: Open Heart Surgery;  Laterality: Right;  . PATENT FORAMEN OVALE(PFO) CLOSURE N/A 01/07/2018   Procedure: PATENT FORAMEN OVALE (PFO) CLOSURE;  Surgeon: Rexene Alberts, MD;  Location: [place];  Service: Open Heart Surgery;  Laterality: N/A;  . RIGHT/LEFT HEART CATH AND CORONARY ANGIOGRAPHY N/A 11/11/2017   Procedure: RIGHT/LEFT HEART CATH AND CORONARY ANGIOGRAPHY;  Surgeon: Sherren Mocha, MD;  Location: [place] CV LAB;  Service: Cardiovascular;  Laterality: N/A;  . TEE WITHOUT CARDIOVERSION N/A 09/18/2017   Procedure: TRANSESOPHAGEAL ECHOCARDIOGRAM (TEE);  Surgeon: Skeet Latch, MD;   Location: [place];  Service: Cardiovascular;  Laterality: N/A;  . TEE WITHOUT CARDIOVERSION N/A 01/07/2018   Procedure: TRANSESOPHAGEAL ECHOCARDIOGRAM (TEE);  Surgeon: Rexene Alberts, MD;  Location: [place];  Service: Open Heart Surgery;  Laterality: N/A;  . TOTAL KNEE ARTHROPLASTY     Left  . TUBAL LIGATION     Past Medical History:  Diagnosis Date  . Atrial flutter ([place])    Typical by EKG diagnosis 9/11 s/p CIT ablation 11/11  . Bradycardia   . Chronic diastolic congestive heart failure ([place])   . 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 DJD (degenerative joint disease)   . Dysrhythmia   . Femur fracture, left ([place]) 2013  . Global aphasia   . Hyperlipidemia    x5 years  . Hypertension    Since 1997  . 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 Liver masses 11/13/2017   Multiple small nodules seen on CT and MRI  . Mitral regurgitation    severe  . 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 Stroke ([place])   . TR (tricuspid regurgitation)    Mild with RA enlargment   BP (!) 196/80   Pulse (!) 46   Temp 97.7 F (36.5 C)   Ht 4\' 10"  (1.473 m)   Wt 157 lb (71.2 kg)   LMP 11/05/1992   SpO2 96%   BMI 32.81 kg/m   Opioid Risk Score:   Fall Risk Score:  `1  Depression screen PHQ 2/9  Depression screen [facility] 2/9 09/21/2018 08/17/2018 06/21/2018 03/05/2018 03/02/2018 11/03/2017 05/19/2017  Decreased Interest 0 0 0 0 2 0 0  Down, Depressed, Hopeless 0 0 0 0 2 0 0  PHQ - 2 Score 0 0 0 0 4 0 0  Altered sleeping - - - 0 3 - -  Tired, decreased energy - - - 0 3 - -  Change in appetite - - - 0 2 - -  Feeling bad or failure about yourself  - - - 0 1 - -  Trouble concentrating - - - 0 1 - -  Moving slowly  or fidgety/restless - - - 0 2 - -  Suicidal thoughts - - - 0 0 - -  PHQ-9 Score - - -  0 16 - -  Some recent data might be hidden    Review of Systems  Constitutional: Negative.   HENT: Negative.   Eyes: Negative.   Respiratory: Negative.   Cardiovascular: Negative.   Gastrointestinal: Negative.   Endocrine: Negative.   Genitourinary: Negative.   Musculoskeletal: Negative.   Skin: Negative.   Allergic/Immunologic: Negative.   Neurological: Positive for speech difficulty.  Hematological: Negative.   All other systems reviewed and are negative.      Objective:   Physical Exam Vitals signs and nursing note reviewed.  Constitutional:      Appearance: Normal appearance.  Neck:     Musculoskeletal: Normal range of motion and neck supple.  Cardiovascular:     Rate and Rhythm: Normal rate and regular rhythm.     Pulses: Normal pulses.     Heart sounds: Normal heart sounds.  Pulmonary:     Effort: Pulmonary effort is normal.     Breath sounds: Normal breath sounds.  Musculoskeletal:     Comments: Normal Muscle Bulk and Muscle Testing Reveals:  Upper Extremities: Full ROM and Muscle Strength on the Right 4/5 and Left 5/5   Lower Extremities: Full ROM and Muscle Strength 5/5 Arises from Table slowly using cane for support Narrow Based Gait   Skin:    General: Skin is warm and dry.  Neurological:     Mental Status: She is alert and oriented to person, place, and time.           Assessment & Plan:  1. 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 Continue to monitor.  3. Uncontrolled Hypertension: Daughter refuses Ed or Urgent Care evaluation. Instructed to keep blood pressure log and follow up with her PCP. She verbalizes understanding.   20 minutes minutes of face to face patient care time was spent during  this visit. All questions were encouraged and answered.  F/U in 6 months

## 2019-06-02 IMAGING — DX DG KNEE COMPLETE 4+V*L*
4 series · 4 of 4 positions shown · non-contrast
Comparison: None

CLINICAL DATA: LEFT knee pain

EXAM:
LEFT KNEE - COMPLETE 4+ VIEW

[knee ap]
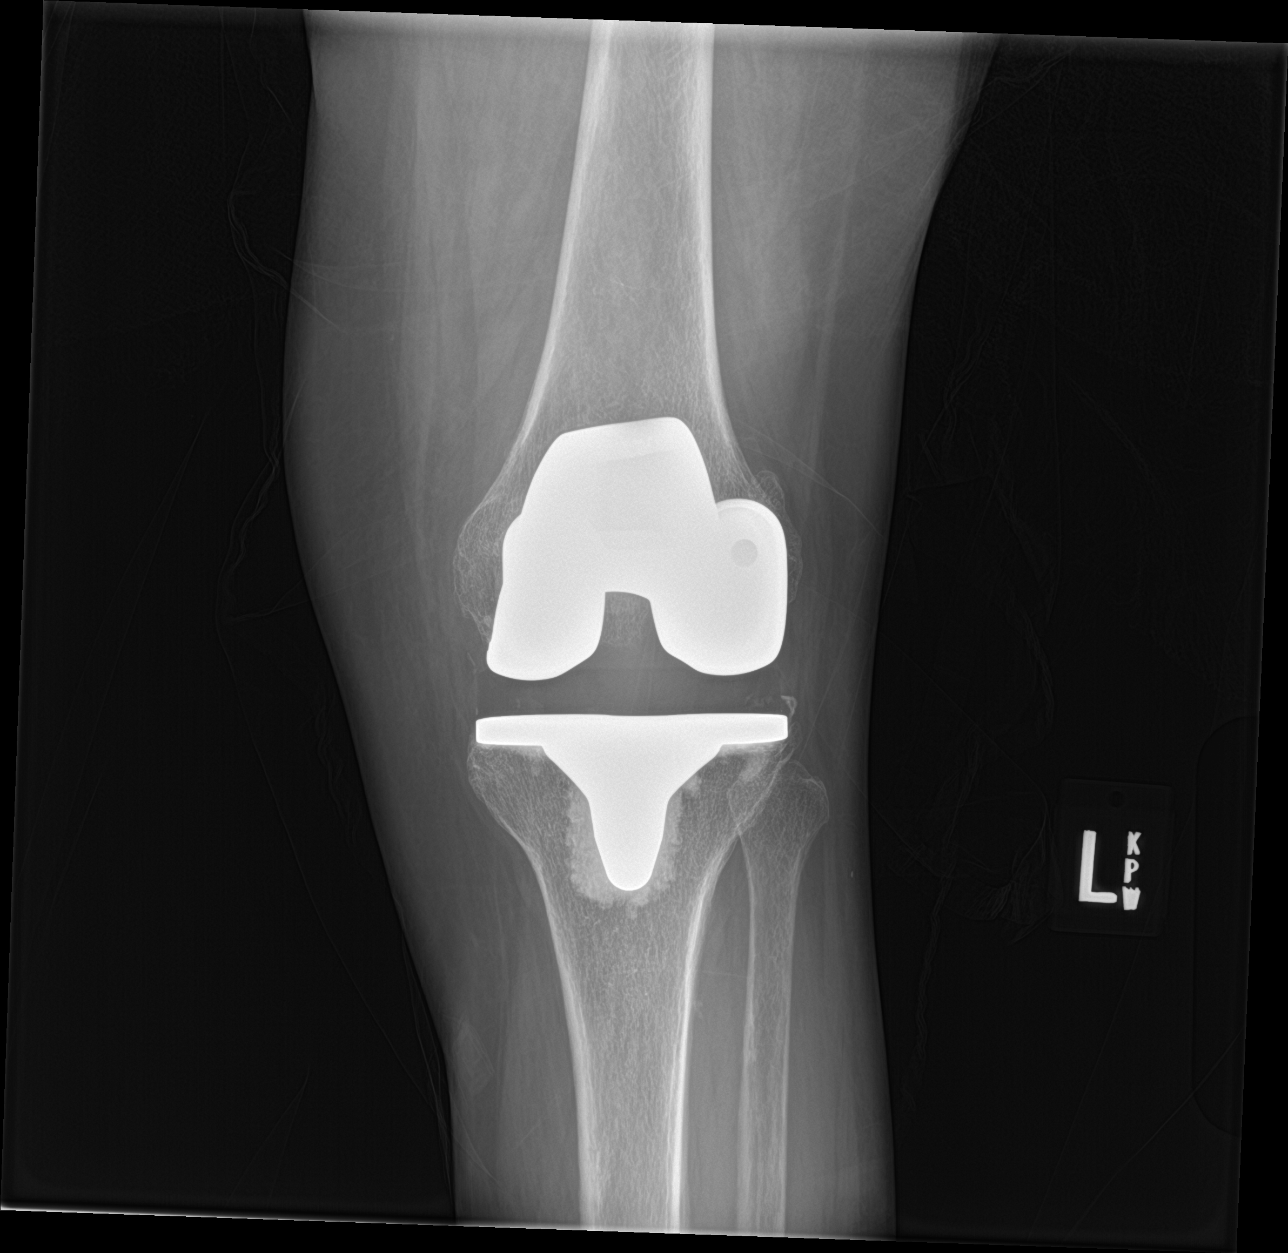

[knee lat]
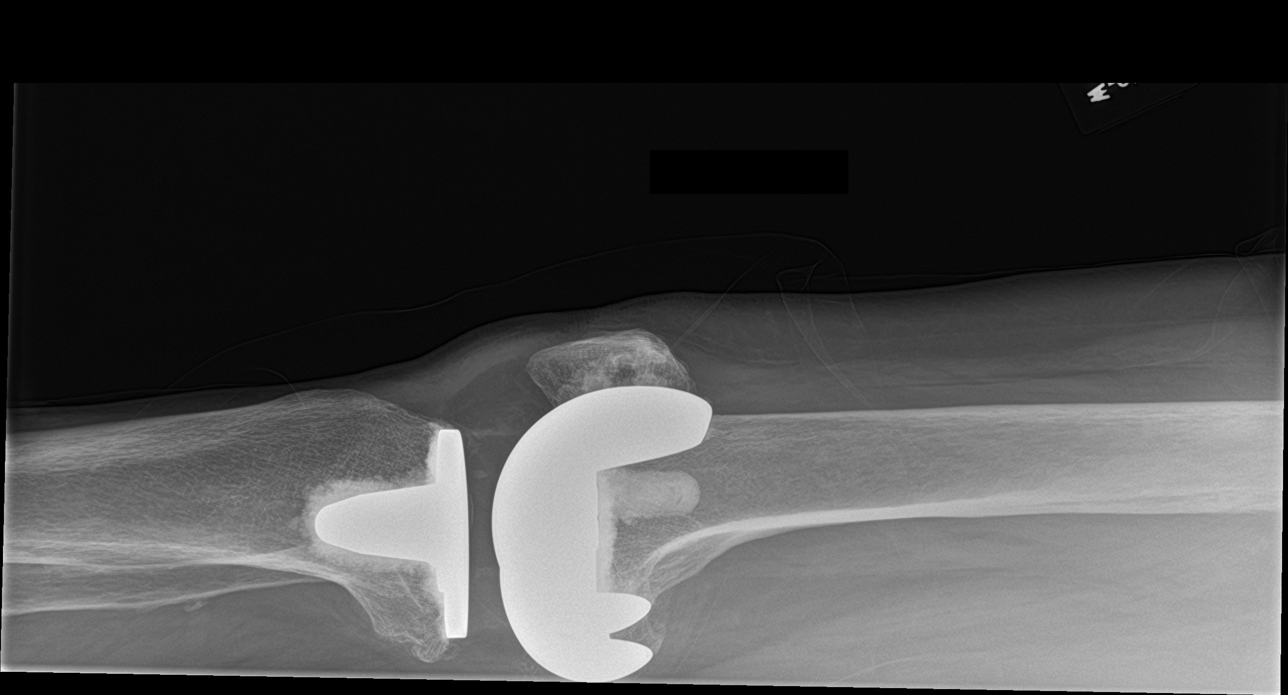

[knee obl (1 of 2)]
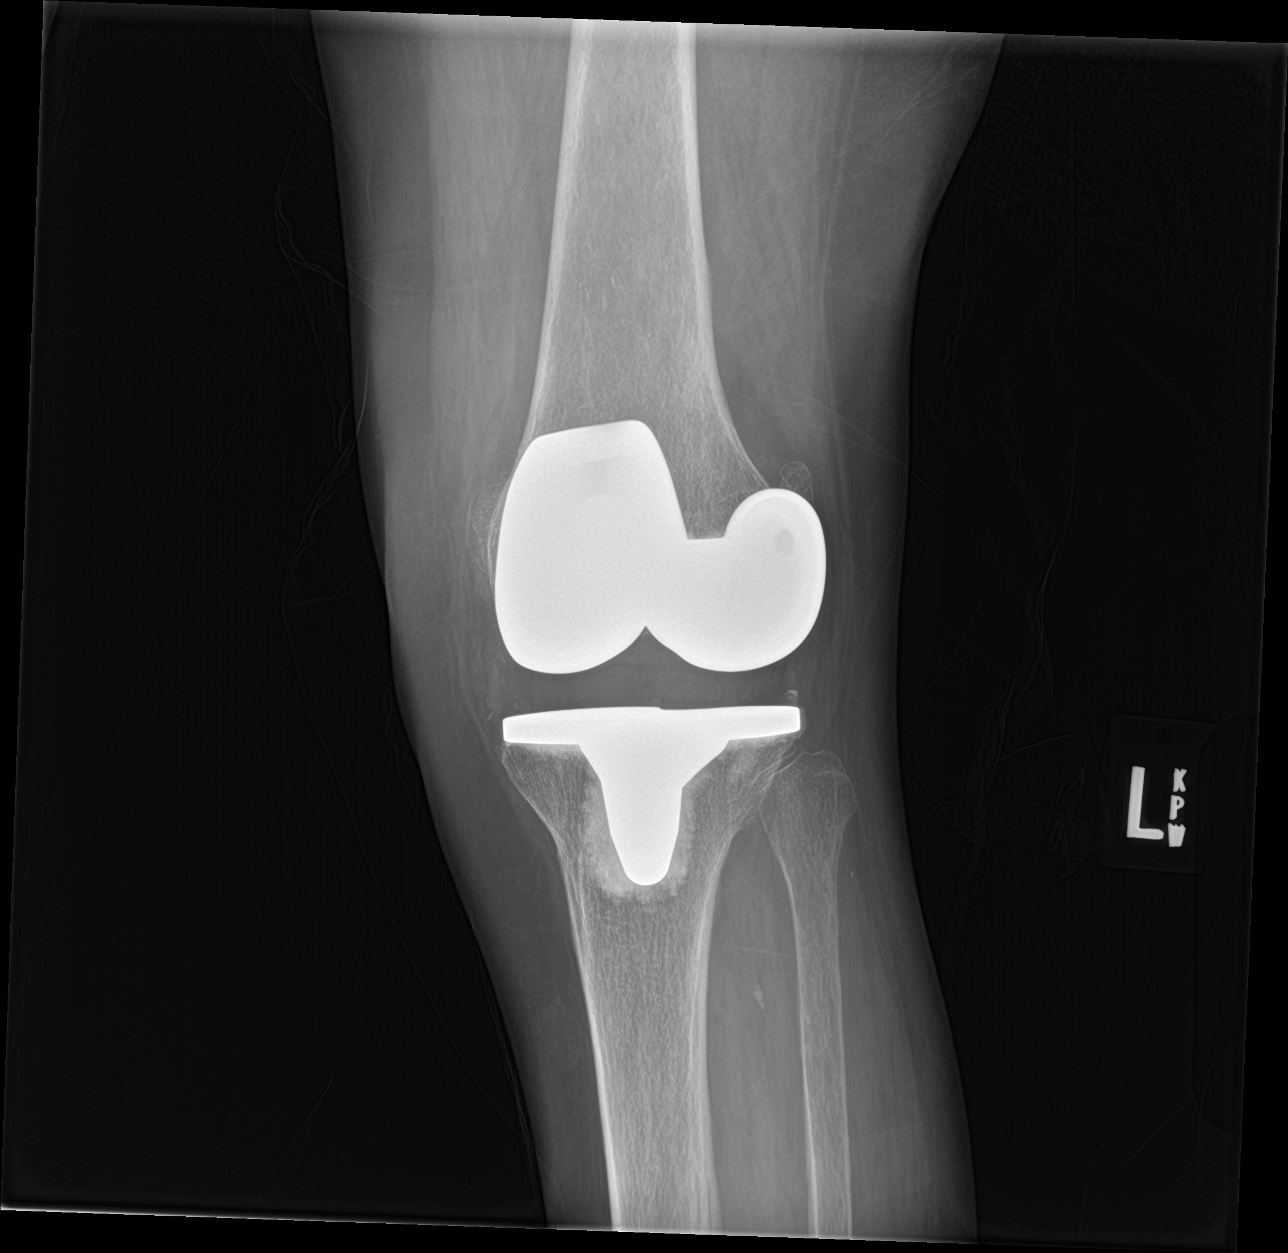

[knee obl (2 of 2)]
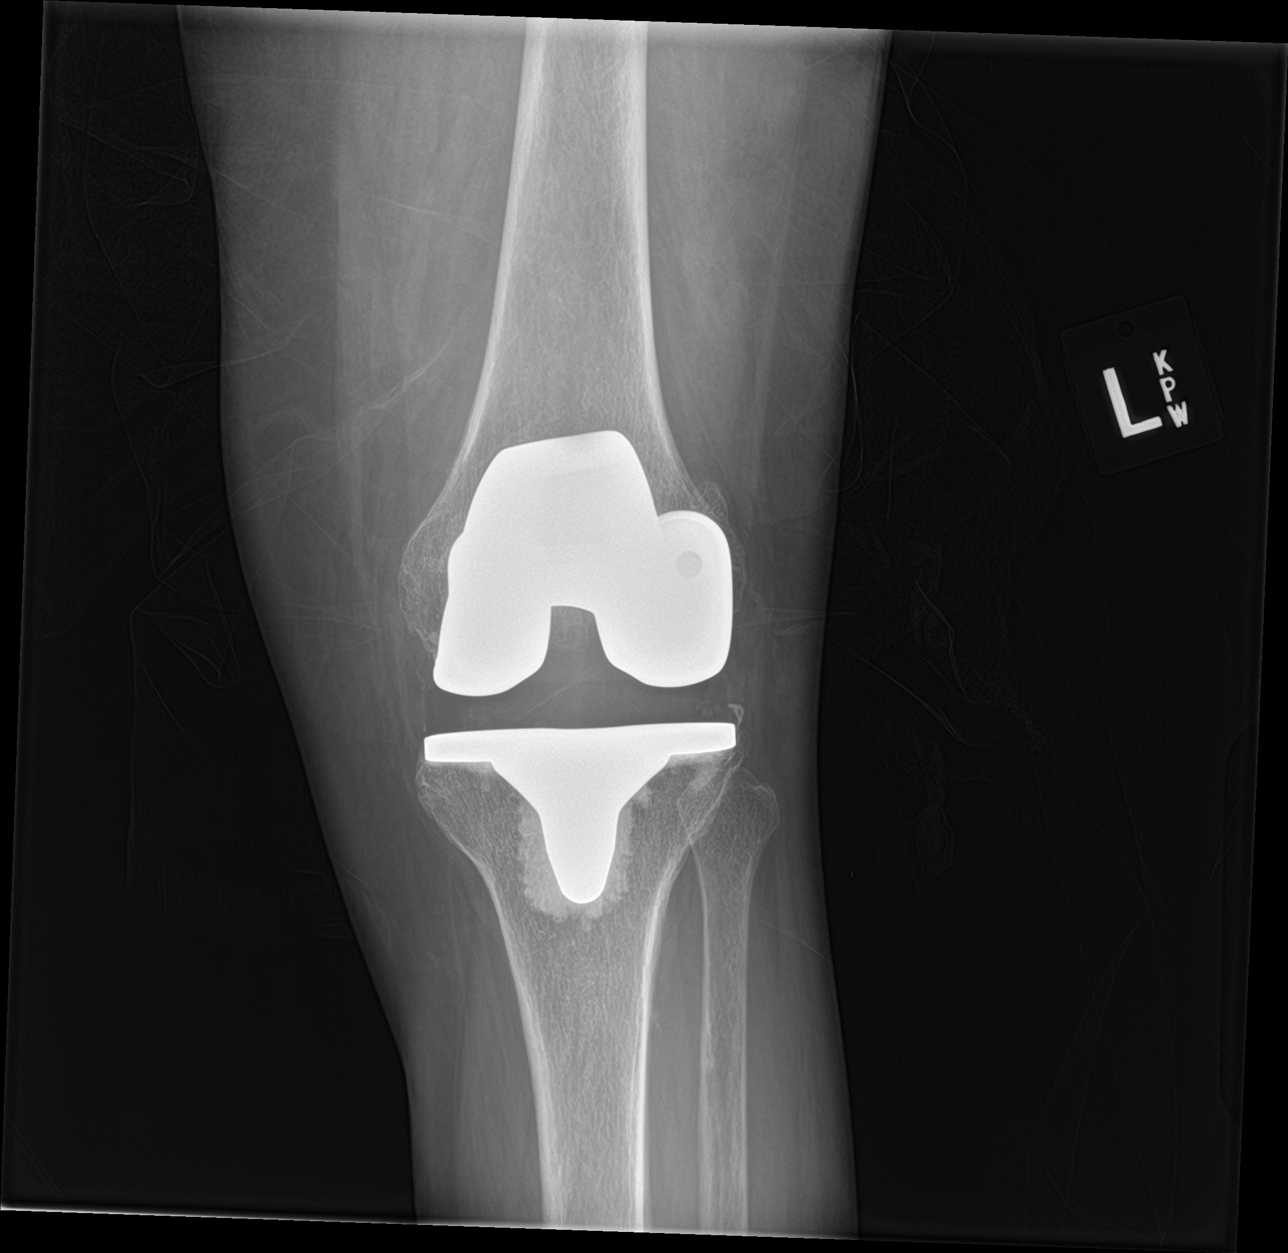

[4 of 4 positions shown; findings below may reference images not displayed]

FINDINGS: Osseous demineralization.

Components of LEFT knee prosthesis identified.

No acute fracture, dislocation, bone destruction, or joint effusion.

No periprosthetic lucency identified.
IMPRESSION: LEFT knee prosthesis and osseous demineralization without acute
abnormalities.

## 2019-06-02 IMAGING — DX DG FEMUR 2+V*R*
4 series · 4 of 4 positions shown · non-contrast
Comparison: None

CLINICAL DATA: Grimaces when getting out of bed and moving around,
pain

EXAM:
RIGHT FEMUR 2 VIEWS

[femur ap (1 of 2)]
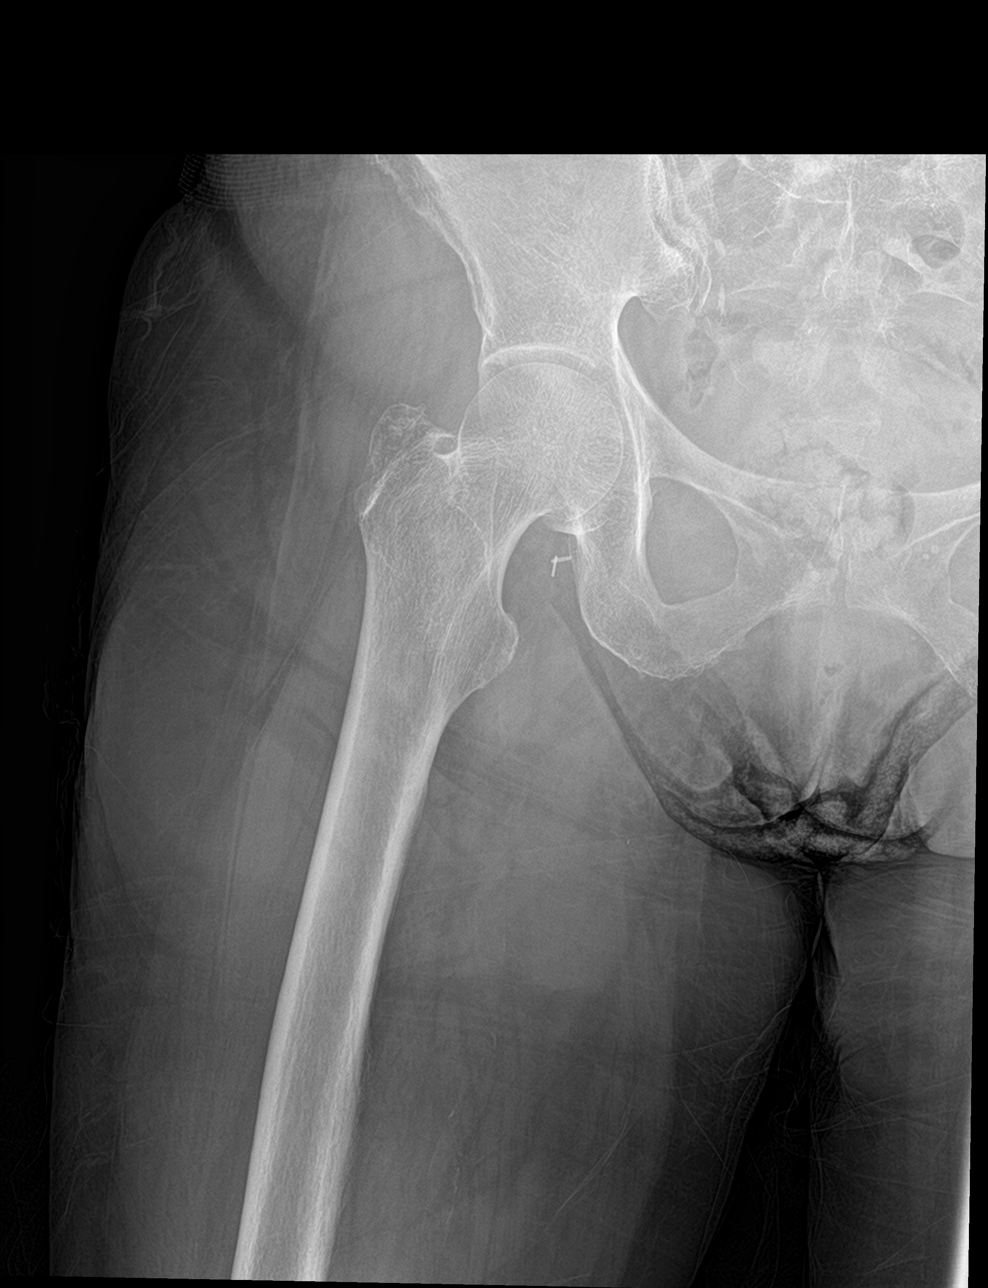

[femur ap (2 of 2)]
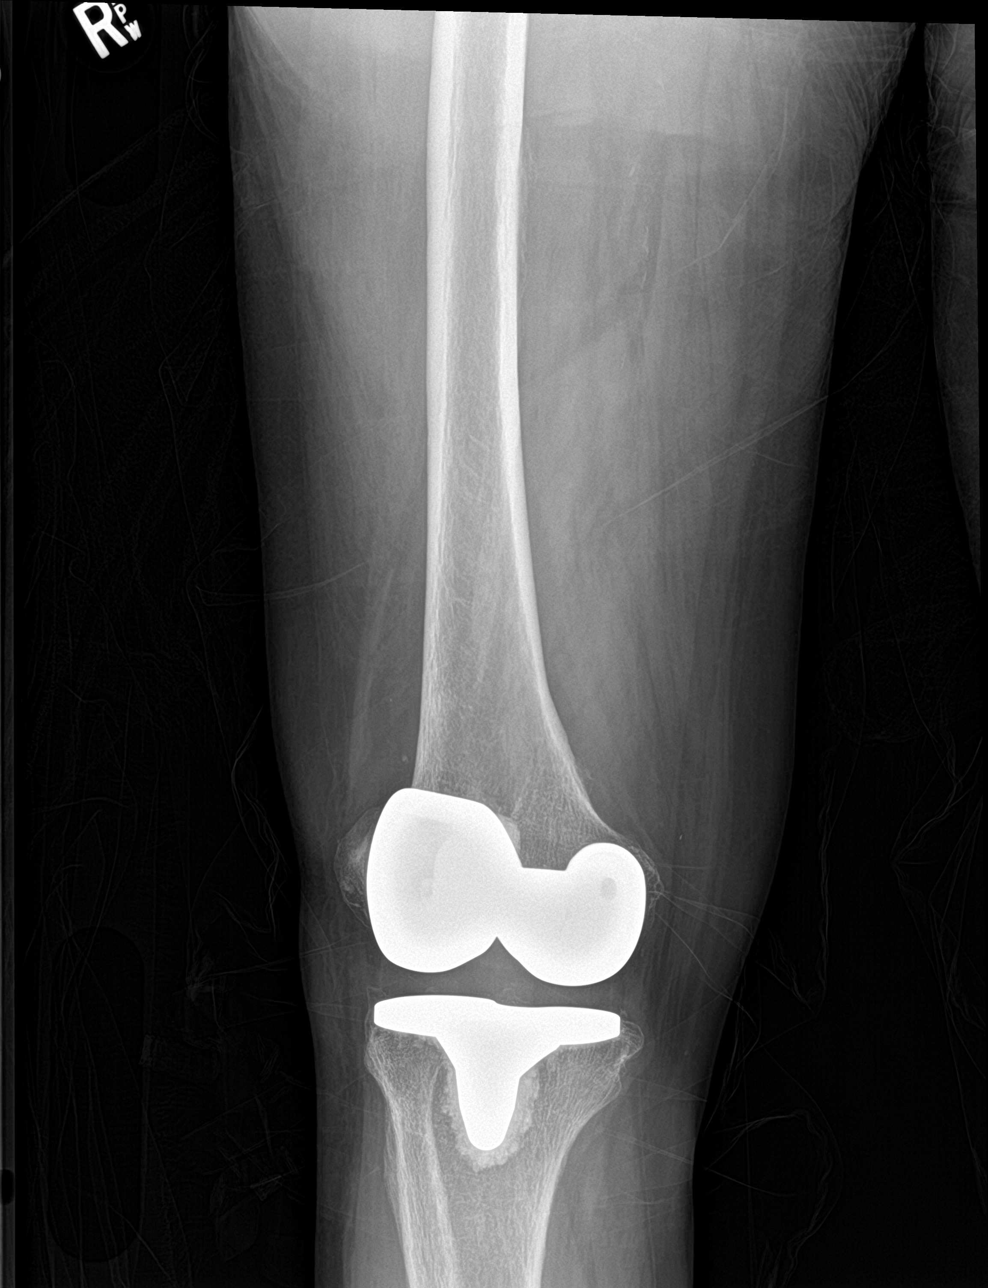

[femur lat (1 of 2)]
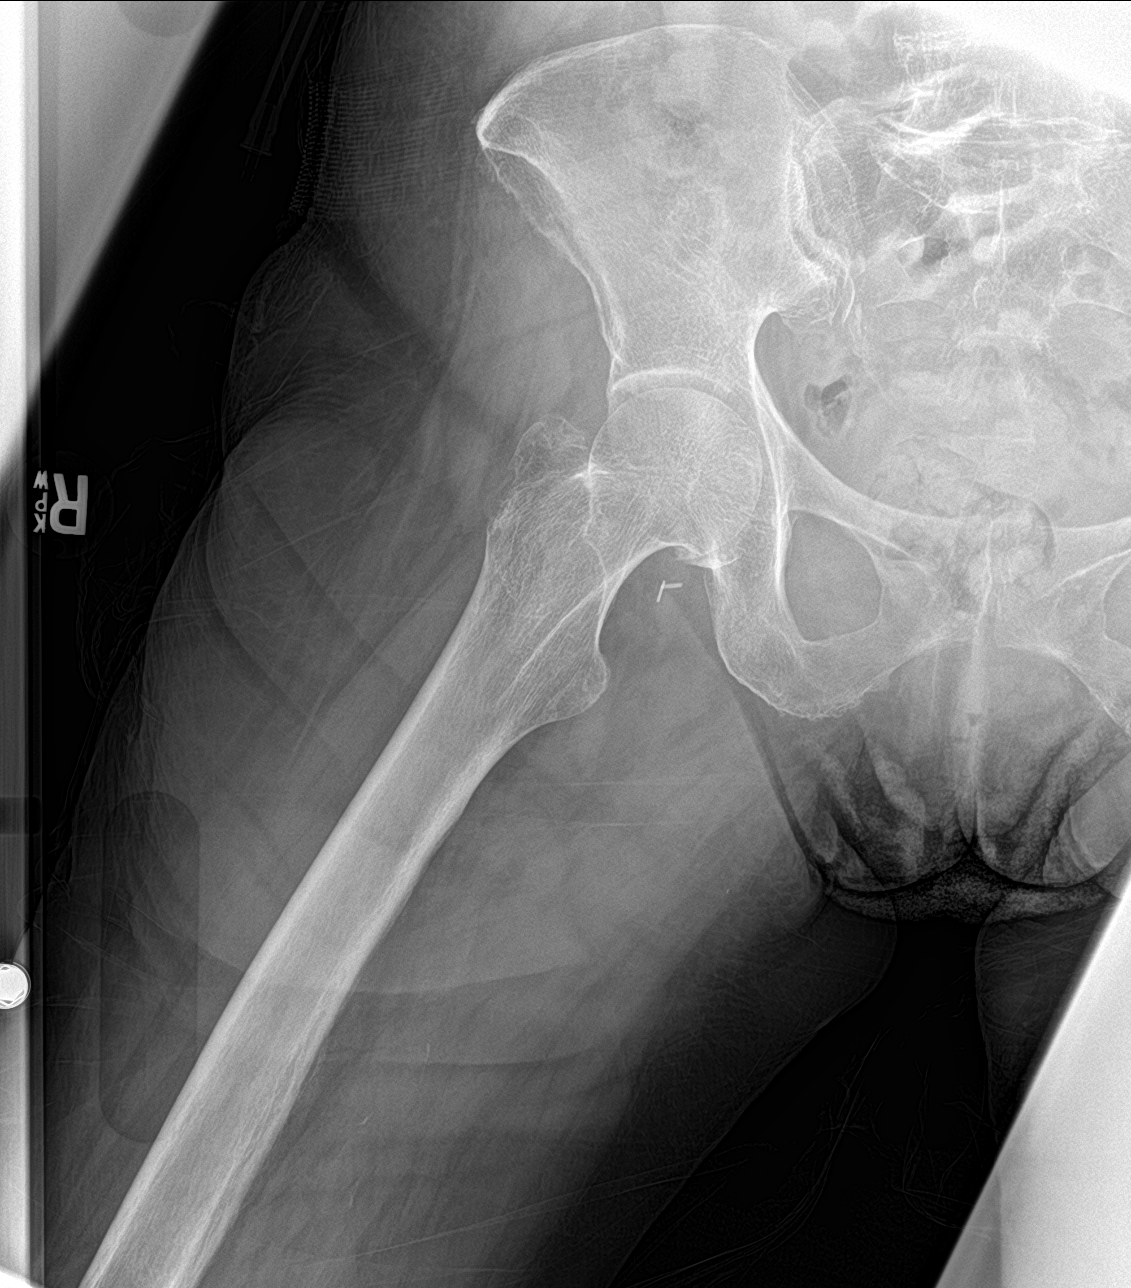

[femur lat (2 of 2)]
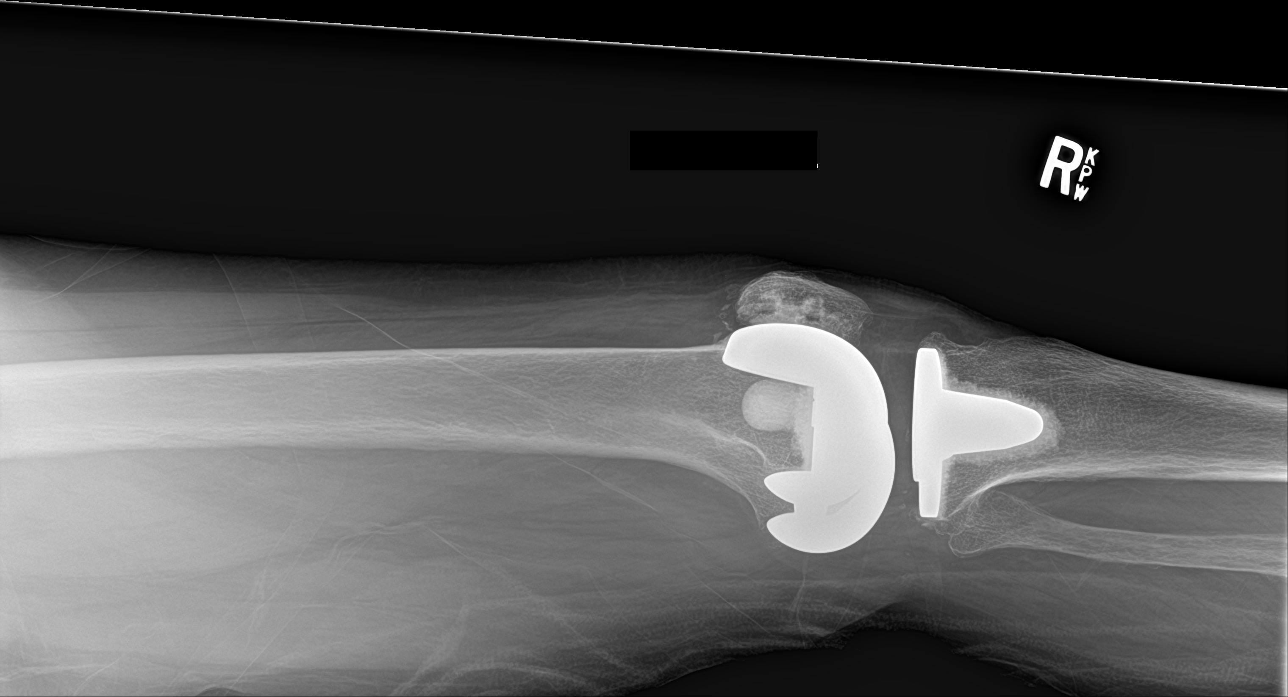

[4 of 4 positions shown; findings below may reference images not displayed]

FINDINGS: Osseous demineralization.

Visualized pelvis intact.

RIGHT hip joint space preserved.

RIGHT knee prosthesis.

No acute fracture, dislocation, or bone destruction.
IMPRESSION: No acute osseous abnormalities.

## 2019-06-15 ENCOUNTER — Telehealth: Payer: Self-pay | Admitting: *Deleted

## 2019-06-15 NOTE — Telephone Encounter (Signed)
Called daughter to inquire if they are now ready to pursue the CT scan and f/u with Dr. Benay Spice. Daughter states yes, as long as she is able to come in with patient. Informed her that patient is now allowed 1 visitor for MD appointments. Sent message to managed care to confirm prior PA has not expired. Will then set up lab/scan and the OV.

## 2019-06-17 ENCOUNTER — Telehealth: Payer: Self-pay | Admitting: Oncology

## 2019-06-17 NOTE — Telephone Encounter (Signed)
Confirmed November appointments with patient sister. Schedule mailed.

## 2019-06-18 ENCOUNTER — Other Ambulatory Visit: Payer: Self-pay | Admitting: Physical Medicine & Rehabilitation

## 2019-06-20 ENCOUNTER — Telehealth: Payer: Self-pay | Admitting: Nurse Practitioner

## 2019-06-20 DIAGNOSIS — I63412 Cerebral infarction due to embolism of left middle cerebral artery: Secondary | ICD-10-CM

## 2019-06-20 MED ORDER — APIXABAN 5 MG PO TABS: ORAL_TABLET | ORAL | 0 refills | Status: DC

## 2019-06-20 NOTE — Telephone Encounter (Signed)
What is the name of the medication? ELIQUIS 5 MG TABS tablet  Have you contacted your pharmacy to request a refill? No, pt will be out this evening. Pt has her apt with MMM tomorrow at 4:00 and will be without.  Which pharmacy would you like this sent to? [place].   Patient notified that their request is being sent to the clinical staff for review and that they should receive a call once it is complete. If they do not receive a call within 24 hours they can check with their pharmacy or our office.

## 2019-06-21 ENCOUNTER — Encounter: Payer: Self-pay | Admitting: Nurse Practitioner

## 2019-06-21 ENCOUNTER — Ambulatory Visit: Payer: Medicare Other | Admitting: Nurse Practitioner

## 2019-06-21 ENCOUNTER — Other Ambulatory Visit: Payer: Self-pay

## 2019-06-21 ENCOUNTER — Ambulatory Visit (INDEPENDENT_AMBULATORY_CARE_PROVIDER_SITE_OTHER): Payer: Medicare Other | Admitting: Nurse Practitioner

## 2019-06-21 ENCOUNTER — Other Ambulatory Visit: Payer: Self-pay | Admitting: Nurse Practitioner

## 2019-06-21 VITALS — BP 160/60 | Temp 96.0°F | Ht <= 58 in | Wt 157.8 lb

## 2019-06-21 DIAGNOSIS — Z6830 Body mass index (BMI) 30.0-30.9, adult: Secondary | ICD-10-CM

## 2019-06-21 DIAGNOSIS — I6930 Unspecified sequelae of cerebral infarction: Secondary | ICD-10-CM | POA: Diagnosis not present

## 2019-06-21 DIAGNOSIS — F5101 Primary insomnia: Secondary | ICD-10-CM

## 2019-06-21 DIAGNOSIS — I63412 Cerebral infarction due to embolism of left middle cerebral artery: Secondary | ICD-10-CM

## 2019-06-21 DIAGNOSIS — K219 Gastro-esophageal reflux disease without esophagitis: Secondary | ICD-10-CM

## 2019-06-21 DIAGNOSIS — I480 Paroxysmal atrial fibrillation: Secondary | ICD-10-CM

## 2019-06-21 DIAGNOSIS — I69391 Dysphagia following cerebral infarction: Secondary | ICD-10-CM | POA: Diagnosis not present

## 2019-06-21 DIAGNOSIS — E785 Hyperlipidemia, unspecified: Secondary | ICD-10-CM

## 2019-06-21 DIAGNOSIS — I10 Essential (primary) hypertension: Secondary | ICD-10-CM | POA: Diagnosis not present

## 2019-06-21 MED ORDER — ATORVASTATIN CALCIUM 40 MG PO TABS: 60.0000 mg | ORAL_TABLET | Freq: Every day | ORAL | 0 refills | Status: DC

## 2019-06-21 MED ORDER — LOSARTAN POTASSIUM 50 MG PO TABS: 50.0000 mg | ORAL_TABLET | Freq: Every day | ORAL | 1 refills | Status: DC

## 2019-06-21 MED ORDER — SPIRONOLACTONE 25 MG PO TABS: 25.0000 mg | ORAL_TABLET | Freq: Every day | ORAL | 1 refills | Status: DC

## 2019-06-21 MED ORDER — APIXABAN 5 MG PO TABS: ORAL_TABLET | ORAL | 0 refills | Status: DC

## 2019-06-21 MED ORDER — TRAZODONE HCL 50 MG PO TABS: 50.0000 mg | ORAL_TABLET | Freq: Every day | ORAL | 5 refills | Status: DC

## 2019-06-21 NOTE — Progress Notes (Signed)
Subjective:    Patient ID: Brooke Chavez, female    DOB: 1941-05-04, 78 y.o.   MRN: [id number]   Chief Complaint: Medical Management of Chronic Issues    HPI:  1. Essential hypertension No c/o chest pain, sob or headache. Does not check blood pressure at home. Blood pressure has been running between 462-703 systolic BP Readings from Last 3 Encounters:  06/21/19 (!) 160/60  05/30/19 (!) 174/97  02/23/19 (!) 138/56     2. PAF (paroxysmal atrial fibrillation) (HCC) Is on eliquis without any bleeding.  3. Gastroesophageal reflux disease without esophagitis Is not on any meds and has occasional symptoms.  4. Dysphagia, post-stroke Has trouble swallowing since she had her stroke  5. Hyperlipidemia with target LDL less than 100 Does not watch diet and is no able to do any exercise. Due to left sided weakness from stroke.  6. BMI 30.0-30.9,adult Discussed diet and exercise for person with BMI >25 Will recheck weight in 3-6 months    Outpatient Encounter Medications as of 06/21/2019  Medication Sig  . acetaminophen (TYLENOL) 325 MG tablet Take 2 tablets (650 mg total) by mouth every 6 (six) hours as needed for mild pain (or Fever >/= 101).  Marland Kitchen apixaban (ELIQUIS) 5 MG TABS tablet TAKE 1 TABLET BY MOUTH TWICE DAILY . APPOINTMENT REQUIRED FOR FUTURE REFILLS  . atorvastatin (LIPITOR) 40 MG tablet Take 1.5 tablets (60 mg total) by mouth at bedtime. (Please make your 6 mos sept appt)  . spironolactone (ALDACTONE) 25 MG tablet Take 1 tablet (25 mg total) by mouth daily.  . traZODone (DESYREL) 50 MG tablet TAKE 1 TABLET BY MOUTH AT BEDTIME     Past Surgical History:  Procedure Laterality Date  . A FLUTTER ABLATION N/A 06/27/2011   Procedure: ABLATION A FLUTTER;  Surgeon: Thompson Grayer, MD;  Location: [facility] CATH LAB;  Service: Cardiovascular;  Laterality: N/A;  . ATRIAL ABLATION SURGERY  06/2010  . HIP SURGERY     Left (fracture) 3/13  . JOINT REPLACEMENT     both knees   . KNEE  ARTHROSCOPY     Right  . LUMBAR SPINE SURGERY    . MITRAL VALVE REPAIR Right 01/07/2018   Procedure: MINIMALLY INVASIVE MITRAL VALVE REPAIR using LivaNova ring size 28 MM;  Surgeon: Rexene Alberts, MD;  Location: [facility];  Service: Open Heart Surgery;  Laterality: Right;  . PATENT FORAMEN OVALE(PFO) CLOSURE N/A 01/07/2018   Procedure: PATENT FORAMEN OVALE (PFO) CLOSURE;  Surgeon: Rexene Alberts, MD;  Location: [facility];  Service: Open Heart Surgery;  Laterality: N/A;  . RIGHT/LEFT HEART CATH AND CORONARY ANGIOGRAPHY N/A 11/11/2017   Procedure: RIGHT/LEFT HEART CATH AND CORONARY ANGIOGRAPHY;  Surgeon: Sherren Mocha, MD;  Location: [facility] CV LAB;  Service: Cardiovascular;  Laterality: N/A;  . TEE WITHOUT CARDIOVERSION N/A 09/18/2017   Procedure: TRANSESOPHAGEAL ECHOCARDIOGRAM (TEE);  Surgeon: Skeet Latch, MD;  Location: [facility];  Service: Cardiovascular;  Laterality: N/A;  . TEE WITHOUT CARDIOVERSION N/A 01/07/2018   Procedure: TRANSESOPHAGEAL ECHOCARDIOGRAM (TEE);  Surgeon: Rexene Alberts, MD;  Location: [facility];  Service: Open Heart Surgery;  Laterality: N/A;  . TOTAL KNEE ARTHROPLASTY     Left  . TUBAL LIGATION      Family History  Problem Relation Age of Onset  . Hypertension Mother   . Cancer Sister        leukemia  . Diabetes Brother   . Stroke Sister   . Diabetes Brother   .  Heart attack Brother   . Healthy Daughter   . Healthy Son   . Healthy Daughter     New complaints: Blood pressure problems as stated above.  Social history: Lives with her daughter since she had her stroke.  Controlled substance contract: n/a    Review of Systems  Constitutional: Negative for activity change and appetite change.  HENT: Negative.   Eyes: Negative for pain.  Respiratory: Negative for shortness of breath.   Cardiovascular: Negative for chest pain, palpitations and leg swelling.  Gastrointestinal: Negative for abdominal pain.  Endocrine: Negative for polydipsia.   Genitourinary: Negative.   Skin: Negative for rash.  Neurological: Negative for dizziness, weakness and headaches.  Hematological: Does not bruise/bleed easily.  Psychiatric/Behavioral: Negative.   All other systems reviewed and are negative.      Objective:   Physical Exam Vitals signs and nursing note reviewed.  Constitutional:      General: She is not in acute distress.    Appearance: Normal appearance. She is well-developed.  HENT:     Head: Normocephalic.     Nose: Nose normal.  Eyes:     Pupils: Pupils are equal, round, and reactive to light.  Neck:     Musculoskeletal: Normal range of motion and neck supple.     Vascular: No carotid bruit or JVD.  Cardiovascular:     Rate and Rhythm: Normal rate and regular rhythm.     Heart sounds: Normal heart sounds.  Pulmonary:     Effort: Pulmonary effort is normal. No respiratory distress.     Breath sounds: Normal breath sounds. No wheezing or rales.  Chest:     Chest wall: No tenderness.  Abdominal:     General: Bowel sounds are normal. There is no distension or abdominal bruit.     Palpations: Abdomen is soft. There is no hepatomegaly, splenomegaly, mass or pulsatile mass.     Tenderness: There is no abdominal tenderness.  Musculoskeletal: Normal range of motion.     Comments: Left arm and leg weaker then right Left grip weaker then right Walks with a cane  Lymphadenopathy:     Cervical: No cervical adenopathy.  Skin:    General: Skin is warm and dry.  Neurological:     Mental Status: She is alert and oriented to person, place, and time.     Deep Tendon Reflexes: Reflexes are normal and symmetric.  Psychiatric:        Behavior: Behavior normal.        Thought Content: Thought content normal.        Judgment: Judgment normal.    BP (!) 160/60   Temp (!) 96 F (35.6 C) (Temporal)   Ht _0  (1.473 m)   Wt 157 lb 12.8 oz (71.6 kg)   LMP 11/05/1992   SpO2 98%   BMI 32.98 kg/m         Assessment & Plan:   Brooke Chavez comes in today with chief complaint of Medical Management of Chronic Issues   Diagnosis and orders addressed:  1. Essential hypertension Low sodium diet Started on losartan 77m daioy Will check potassium in 1 month Eat bananna several times a wek - CMP14+EGFR  2. PAF (paroxysmal atrial fibrillation) (HCC) - apixaban (ELIQUIS) 5 MG TABS tablet; TAKE 1 TABLET BY MOUTH TWICE DAILY . APPOINTMENT REQUIRED FOR FUTURE REFILLS  Dispense: 60 tablet; Refill: 0  3. Gastroesophageal reflux disease without esophagitis Avoid spicy foods Do not eat 2 hours prior to  bedtime  4. Dysphagia, post-stroke Chew food well  5. Hyperlipidemia with target LDL less than 100 Lowfat diet - Lipid panel - atorvastatin (LIPITOR) 40 MG tablet; Take 1.5 tablets (60 mg total) by mouth at bedtime. (Please make your 6 mos sept appt)  Dispense: 90 tablet; Refill: 0  6. BMI 30.0-30.9,adult Discussed diet and exercise for person with BMI >25 Will recheck weight in 3-6 months  7. Late effect of cerebrovascular accident (CVA) Fall oprecautions - spironolactone (ALDACTONE) 25 MG tablet; Take 1 tablet (25 mg total) by mouth daily.  Dispense: 90 tablet; Refill: 1  8. Cerebrovascular accident (CVA) due to embolism of left middle cerebral artery ([place])   9. Primary insomnia Bedtime routine - traZODone (DESYREL) 50 MG tablet; Take 1 tablet (50 mg total) by mouth at bedtime.  Dispense: 30 tablet; Refill: 5   Labs pending Health Maintenance reviewed Diet and exercise encouraged  Follow up plan: 1 month   [place], FNP

## 2019-06-21 NOTE — Patient Instructions (Signed)

## 2019-06-23 ENCOUNTER — Telehealth: Payer: Self-pay

## 2019-06-23 NOTE — Telephone Encounter (Signed)
Daughter would like to make sure that patient is supposed to be on losartan and spironolactone?  According to her last visit 11/10 with MMM she was just adding the losartan.  Daughter would like to double check. Please advise

## 2019-06-24 ENCOUNTER — Telehealth: Payer: Self-pay | Admitting: Nurse Practitioner

## 2019-06-24 NOTE — Telephone Encounter (Signed)
Pt daughter aware

## 2019-06-24 NOTE — Telephone Encounter (Signed)
Yes she is suppose to be on both spirolactone and losartan because blood pressure was  elevated

## 2019-06-24 NOTE — Telephone Encounter (Signed)
Advised we are waiting for MMM to get back to [place] on the call from yesterday and then we will call her and let her know.

## 2019-06-29 ENCOUNTER — Inpatient Hospital Stay: Payer: Medicare Other | Attending: Oncology

## 2019-06-29 ENCOUNTER — Other Ambulatory Visit: Payer: Self-pay

## 2019-06-29 ENCOUNTER — Telehealth: Payer: Self-pay | Admitting: *Deleted

## 2019-06-29 ENCOUNTER — Ambulatory Visit (HOSPITAL_COMMUNITY)
Admission: RE | Admit: 2019-06-29 | Discharge: 2019-06-29 | Disposition: A | Discharge status: Home / Self Care | Payer: Medicare Other | Source: Ambulatory Visit | Attending: Oncology | Admitting: Oncology

## 2019-06-29 ENCOUNTER — Encounter (HOSPITAL_COMMUNITY): Payer: Self-pay

## 2019-06-29 DIAGNOSIS — K869 Disease of pancreas, unspecified: Secondary | ICD-10-CM | POA: Diagnosis not present

## 2019-06-29 DIAGNOSIS — K8689 Other specified diseases of pancreas: Secondary | ICD-10-CM | POA: Diagnosis not present

## 2019-06-29 DIAGNOSIS — K769 Liver disease, unspecified: Secondary | ICD-10-CM | POA: Diagnosis not present

## 2019-06-29 DIAGNOSIS — I4892 Unspecified atrial flutter: Secondary | ICD-10-CM | POA: Diagnosis not present

## 2019-06-29 DIAGNOSIS — C7A8 Other malignant neuroendocrine tumors: Secondary | ICD-10-CM | POA: Insufficient documentation

## 2019-06-29 DIAGNOSIS — Z79899 Other long term (current) drug therapy: Secondary | ICD-10-CM | POA: Insufficient documentation

## 2019-06-29 DIAGNOSIS — I119 Hypertensive heart disease without heart failure: Secondary | ICD-10-CM | POA: Diagnosis not present

## 2019-06-29 LAB — CMP (CANCER CENTER ONLY)
ALT: 18 U/L (ref 0–44)
AST: 21 U/L (ref 15–41)
Albumin: 3.9 g/dL (ref 3.5–5.0)
Alkaline Phosphatase: 82 U/L (ref 38–126)
Anion gap: 12 (ref 5–15)
BUN: 16 mg/dL (ref 8–23)
CO2: 24 mmol/L (ref 22–32)
Calcium: 9.5 mg/dL (ref 8.9–10.3)
Chloride: 106 mmol/L (ref 98–111)
Creatinine: 1.42 mg/dL — ABNORMAL HIGH (ref 0.44–1.00)
GFR, Est AFR Am: 41 mL/min — ABNORMAL LOW (ref >60)
GFR, Estimated: 35 mL/min — ABNORMAL LOW (ref >60)
Glucose, Bld: 83 mg/dL (ref 70–99)
Potassium: 4.5 mmol/L (ref 3.5–5.1)
Sodium: 142 mmol/L (ref 135–145)
Total Bilirubin: 0.7 mg/dL (ref 0.3–1.2)
Total Protein: 7.7 g/dL (ref 6.5–8.1)

## 2019-06-29 MED ORDER — IOHEXOL 300 MG/ML  SOLN
75.0000 mL | Freq: Once | INTRAMUSCULAR | Status: AC | PRN
  Administered 2019-06-29: 75 mL via INTRAVENOUS

## 2019-06-29 MED ORDER — SODIUM CHLORIDE (PF) 0.9 % IJ SOLN
INTRAMUSCULAR | Status: AC
  Filled 2019-06-29: qty 50

## 2019-06-29 NOTE — Telephone Encounter (Signed)
Per Dr.Sherrill, called pt and left vmail to callback office. Also communicated through [place] (pt perferred communication) Dr.Sherrill's note and results

## 2019-06-29 NOTE — Telephone Encounter (Signed)
-----   Message from Ladell Pier, MD sent at 06/29/2019  1:02 PM EST ----- Please call patient Brooke Chavez, Brooke Chavez, Brooke Chavez

## 2019-06-29 NOTE — Telephone Encounter (Signed)
Pt call back received, advised pt, per Dr. Benay Spice, to hold aldactone and losartan, push fluids, and bmet will be rechecked on 11/20. Pt family, Quenton Fetter verbalized understanding

## 2019-07-01 ENCOUNTER — Telehealth: Payer: Self-pay | Admitting: Oncology

## 2019-07-01 ENCOUNTER — Inpatient Hospital Stay (HOSPITAL_BASED_OUTPATIENT_CLINIC_OR_DEPARTMENT_OTHER): Payer: Medicare Other | Admitting: Oncology

## 2019-07-01 ENCOUNTER — Inpatient Hospital Stay: Payer: Medicare Other

## 2019-07-01 ENCOUNTER — Telehealth: Payer: Self-pay | Admitting: *Deleted

## 2019-07-01 ENCOUNTER — Other Ambulatory Visit: Payer: Self-pay

## 2019-07-01 VITALS — BP 197/71 | HR 81 | Temp 98.2°F | Resp 18 | Ht <= 58 in | Wt 158.8 lb

## 2019-07-01 DIAGNOSIS — Z79899 Other long term (current) drug therapy: Secondary | ICD-10-CM | POA: Diagnosis not present

## 2019-07-01 DIAGNOSIS — K769 Liver disease, unspecified: Secondary | ICD-10-CM | POA: Diagnosis not present

## 2019-07-01 DIAGNOSIS — I63512 Cerebral infarction due to unspecified occlusion or stenosis of left middle cerebral artery: Secondary | ICD-10-CM | POA: Diagnosis not present

## 2019-07-01 DIAGNOSIS — I4892 Unspecified atrial flutter: Secondary | ICD-10-CM | POA: Diagnosis not present

## 2019-07-01 DIAGNOSIS — C7A8 Other malignant neuroendocrine tumors: Secondary | ICD-10-CM | POA: Diagnosis not present

## 2019-07-01 DIAGNOSIS — K8689 Other specified diseases of pancreas: Secondary | ICD-10-CM

## 2019-07-01 DIAGNOSIS — I119 Hypertensive heart disease without heart failure: Secondary | ICD-10-CM | POA: Diagnosis not present

## 2019-07-01 LAB — BASIC METABOLIC PANEL - CANCER CENTER ONLY
Anion gap: 12 (ref 5–15)
BUN: 15 mg/dL (ref 8–23)
CO2: 24 mmol/L (ref 22–32)
Calcium: 9.6 mg/dL (ref 8.9–10.3)
Chloride: 106 mmol/L (ref 98–111)
Creatinine: 1.31 mg/dL — ABNORMAL HIGH (ref 0.44–1.00)
GFR, Est AFR Am: 45 mL/min — ABNORMAL LOW (ref >60)
GFR, Estimated: 39 mL/min — ABNORMAL LOW (ref >60)
Glucose, Bld: 73 mg/dL (ref 70–99)
Potassium: 4.2 mmol/L (ref 3.5–5.1)
Sodium: 142 mmol/L (ref 135–145)

## 2019-07-01 NOTE — Telephone Encounter (Signed)
Notified of message below

## 2019-07-01 NOTE — Progress Notes (Signed)
[facility] OFFICE PROGRESS NOTE   Diagnosis: Pancreas neuroendocrine tumor  INTERVAL HISTORY:   Ms. Atiyeh returns for a scheduled visit.  She is here with her daughter.  She feels well.  Good appetite.  No pain.  No diarrhea.  She is able to ambulate with a cane.  Objective:  Vital signs in last 24 hours:  Blood pressure (!) 197/71, pulse 81, temperature 98.2 F (36.8 C), temperature source Temporal, resp. rate 18, height 4\' 10"  (1.473 m), weight 158 lb 12.8 oz (72 kg), last menstrual period 11/05/1992, SpO2 100 %.   Limited physical examination secondary to distancing with the Covid pandemic Lymphatics: No cervical or supraclavicular nodes GI: No hepatosplenomegaly, no mass, nontender Vascular: No leg edema    Portacath/PICC-without erythema  Lab Results:   CMP  Lab Results  Component Value Date   NA 142 06/29/2019   K 4.5 06/29/2019   CL 106 06/29/2019   CO2 24 06/29/2019   GLUCOSE 83 06/29/2019   BUN 16 06/29/2019   CREATININE 1.42 (H) 06/29/2019   CALCIUM 9.5 06/29/2019   PROT 7.7 06/29/2019   ALBUMIN 3.9 06/29/2019   AST 21 06/29/2019   ALT 18 06/29/2019   ALKPHOS 82 06/29/2019   BILITOT 0.7 06/29/2019   GFRNONAA 35 (L) 06/29/2019   GFRAA 41 (L) 06/29/2019     Imaging:  Ct Abdomen Pelvis W Contrast  Result Date: 06/29/2019 CLINICAL DATA:  Follow-up pancreas mass and liver lesions EXAM: CT ABDOMEN AND PELVIS WITH CONTRAST TECHNIQUE: Multidetector CT imaging of the abdomen and pelvis was performed using the standard protocol following bolus administration of intravenous contrast. CONTRAST:  65mL OMNIPAQUE IOHEXOL 300 MG/ML SOLN, additional oral enteric contrast COMPARISON:  CT abdomen pelvis, 03/14/2018, Dotatate PET-CT, 11/30/2017, MR abdomen, 11/21/2017 FINDINGS: Lower chest: No acute abnormality. Cardiomegaly. Mitral valve prosthesis. Bibasilar scarring or atelectasis. Hepatobiliary: There are multiple low-attenuation lesions of the liver,  nonenhancing and the larger of which are clearly characterized as benign cysts and hemangiomata by prior MRI. Small hyperenhancing lesions seen by prior MRI are not appreciated by CT. No gallstones, gallbladder wall thickening, or biliary dilatation. Pancreas: Examination of the pancreas is somewhat limited by cardiac or breath motion artifact throughout the upper abdomen on all phases. Within this limitation, there is a vividly hyperenhancing lesion of the mid pancreatic tail measuring 0.7 x 0.7 cm (series 2, image 40), generally unchanged in comparison to prior examinations. Spleen: Normal in size without significant abnormality. Adrenals/Urinary Tract: Adrenal glands are unremarkable. Redemonstrated malrotated, pelvic right kidney. Left renal cysts without other abnormality. No calculi or hydronephrosis. Bladder is unremarkable. Stomach/Bowel: Stomach is within normal limits. Appendix appears normal. No evidence of bowel wall thickening, distention, or inflammatory changes. Descending and sigmoid diverticulosis. Vascular/Lymphatic: Aortic atherosclerosis. No enlarged abdominal or pelvic lymph nodes. Reproductive: No mass or other significant abnormality. Other: No abdominal wall hernia or abnormality. No abdominopelvic ascites. Musculoskeletal: No acute or significant osseous findings. IMPRESSION: 1. Examination of the pancreas is somewhat limited by cardiac or breath motion artifact throughout the upper abdomen on all phases. Within this limitation, there is a vividly hyperenhancing lesion of the mid pancreatic tail measuring 0.7 x 0.7 cm (series 2, image 40), generally unchanged in comparison to prior examinations. 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 2. There are multiple low-attenuation lesions of the liver, nonenhancing and the largest of which are clearly characterized as benign cysts and hemangiomata by prior MRI. The appearance  of the liver is unchanged compared to prior examinations and small hyperenhancing lesions seen by prior MRI are not appreciated by CT. 3. Alternating follow-up imaging between multiphasic contrast enhanced MRI and CT may be helpful to assess primary lesion of the pancreas and suspected hepatic metastases. 4.  Aortic Atherosclerosis (ICD10-I70.0). Electronically Signed   By: Eddie Candle M.D.   On: 06/29/2019 14:19    Medications: I have reviewed the patient's current medications.   Assessment/Plan: 1.  Pancreas mass  0.8 cm hypervascular lesion in the pancreas body and multiple enhancing liver lesions noted on CT 11/13/2017  MRI 11/21/2017-hypervascular liver lesions evident on arterial phase, pancreas lesion not identified  Netspot 11/30/2017- intense uptake at the pancreas tail lesion consistent with a primary neuroendocrine neoplasm, no uptake in the liver above background  CT 06/29/2019-unchanged pancreas tail lesion multiple low-attenuation liver lesions-largest lesions were characterized as cysts and hemangiomata by MRI, small hyperenhancing lesion seen by MRI are not appreciated by CT 2. Atrial flutter 3. 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 Left MCA CVA 01/24/2018 5. Hypertension    Disposition: Ms. Lapinski appears unchanged.  There is no clinical evidence for progression of the pancreatic neuroendocrine tumor.  The plan is to continue observation.  She will return for an office visit in 6 months.  I reviewed the CT images with Ms. Tennison and her daughter.  The creatinine was elevated and she received CT contrast on 06/29/2019.  We asked her to hold losartan and spironolactone.  We will check a chemistry panel today and resume antihypertensive medications if the creatinine is stable.  Betsy Coder, MD  07/01/2019  10:13 AM

## 2019-07-01 NOTE — Telephone Encounter (Signed)
Scheduled per 11/20 los, patient will receive updates on my chart.

## 2019-07-01 NOTE — Telephone Encounter (Signed)
-----   Message from Ladell Pier, MD sent at 07/01/2019  2:04 PM EST ----- Please call patient, daughter, creatinine is better, resume blood pressure medications

## 2019-07-05 ENCOUNTER — Other Ambulatory Visit: Payer: Self-pay | Admitting: Neurology

## 2019-07-05 DIAGNOSIS — E785 Hyperlipidemia, unspecified: Secondary | ICD-10-CM

## 2019-07-22 ENCOUNTER — Encounter: Payer: Self-pay | Admitting: Nurse Practitioner

## 2019-07-22 ENCOUNTER — Other Ambulatory Visit: Payer: Self-pay

## 2019-07-22 ENCOUNTER — Ambulatory Visit (INDEPENDENT_AMBULATORY_CARE_PROVIDER_SITE_OTHER): Payer: Medicare Other | Admitting: Nurse Practitioner

## 2019-07-22 VITALS — BP 188/68 | HR 51 | Temp 98.0°F | Resp 20 | Ht <= 58 in | Wt 159.0 lb

## 2019-07-22 DIAGNOSIS — I10 Essential (primary) hypertension: Secondary | ICD-10-CM | POA: Diagnosis not present

## 2019-07-22 DIAGNOSIS — I6930 Unspecified sequelae of cerebral infarction: Secondary | ICD-10-CM | POA: Diagnosis not present

## 2019-07-22 NOTE — Progress Notes (Signed)
Subjective:    Patient ID: Brooke Chavez, female    DOB: 11/12/1940, 78 y.o.   MRN: [id number]   Chief Complaint: Hypertension   HPI Patient brought in by her daughter for recheck of blood pressure. She was started on losartan 25m at last visit. Her blood pressure at home has been running in the 1993'Tsystolic.patient denies any headaches, sob or chest pain.    Review of Systems  Constitutional: Negative for diaphoresis.  Eyes: Negative for pain.  Respiratory: Negative for shortness of breath.   Cardiovascular: Negative for chest pain, palpitations and leg swelling.  Gastrointestinal: Negative for abdominal pain.  Endocrine: Negative for polydipsia.  Skin: Negative for rash.  Neurological: Negative for dizziness, weakness and headaches.  Hematological: Does not bruise/bleed easily.  All other systems reviewed and are negative.      Objective:   Physical Exam Vitals and nursing note reviewed.  Constitutional:      Appearance: Normal appearance.  Cardiovascular:     Rate and Rhythm: Normal rate and regular rhythm.     Heart sounds: Normal heart sounds.  Pulmonary:     Breath sounds: Normal breath sounds.  Skin:    General: Skin is warm.  Neurological:     General: No focal deficit present.     Mental Status: She is alert and oriented to person, place, and time.  Psychiatric:        Mood and Affect: Mood normal.        Behavior: Behavior normal.    BP (!) 188/68   Pulse (!) 51   Temp 98 F (36.7 C) (Temporal)   Resp 20   Ht 4' 10" (1.473 m)   Wt 159 lb (72.1 kg)   LMP 11/05/1992   SpO2 97%   BMI 33.23 kg/m         Assessment & Plan:  Brooke MLASHONNE SHULLcomes in today with chief complaint of Hypertension   Diagnosis and orders addressed:  1. Essential hypertension *increased losartan to 108m double up on what have at home and let me know how she is doing on increased dose and we will send in new RX - BMP8+EGFR   Labs pending Health Maintenance  reviewed Diet and exercise encouraged  Follow up plan: 3 month   MaLaguna WoodsFNP

## 2019-07-23 LAB — BMP8+EGFR
BUN/Creatinine Ratio: 14 (ref 12–28)
BUN: 19 mg/dL (ref 8–27)
CO2: 19 mmol/L — ABNORMAL LOW (ref 20–29)
Calcium: 9.4 mg/dL (ref 8.7–10.3)
Chloride: 104 mmol/L (ref 96–106)
Creatinine, Ser: 1.34 mg/dL — ABNORMAL HIGH (ref 0.57–1.00)
GFR calc Af Amer: 44 mL/min/{1.73_m2} — ABNORMAL LOW (ref >59)
GFR calc non Af Amer: 38 mL/min/{1.73_m2} — ABNORMAL LOW (ref >59)
Glucose: 97 mg/dL (ref 65–99)
Potassium: 4.3 mmol/L (ref 3.5–5.2)
Sodium: 142 mmol/L (ref 134–144)

## 2019-08-03 ENCOUNTER — Other Ambulatory Visit: Payer: Self-pay | Admitting: *Deleted

## 2019-08-03 DIAGNOSIS — E785 Hyperlipidemia, unspecified: Secondary | ICD-10-CM

## 2019-08-03 MED ORDER — ATORVASTATIN CALCIUM 40 MG PO TABS: 60.0000 mg | ORAL_TABLET | Freq: Every day | ORAL | 0 refills | Status: DC

## 2019-08-22 ENCOUNTER — Telehealth: Payer: Self-pay | Admitting: Nurse Practitioner

## 2019-08-22 MED ORDER — LOSARTAN POTASSIUM 100 MG PO TABS: 100.0000 mg | ORAL_TABLET | Freq: Every day | ORAL | 1 refills | Status: DC

## 2019-08-22 NOTE — Telephone Encounter (Signed)
aosartin to 100mg  daily

## 2019-09-02 ENCOUNTER — Ambulatory Visit: Payer: Medicare Other | Attending: Internal Medicine

## 2019-09-02 DIAGNOSIS — Z23 Encounter for immunization: Secondary | ICD-10-CM | POA: Insufficient documentation

## 2019-09-02 NOTE — Progress Notes (Signed)
   Covid-19 Vaccination Clinic  Name:  Brooke Chavez    MRN: [id number] DOB: [birth year]  09/02/2019  Brooke Chavez was observed post Covid-19 immunization for 15 minutes without incidence. She was provided with Vaccine Information Sheet and instruction to access the V-Safe system.   Brooke Chavez was instructed to call 911 with any severe reactions post vaccine: Marland Kitchen Difficulty breathing  . Swelling of your face and throat  . A fast heartbeat  . A bad rash all over your body  . Dizziness and weakness    Immunizations Administered    Name Date Dose VIS Date Route   Pfizer COVID-19 Vaccine 09/02/2019  3:22 PM 0.3 mL 07/22/2019 Intramuscular   Manufacturer: [place]   Lot: [id number]   [place]: [id number]

## 2019-09-03 ENCOUNTER — Other Ambulatory Visit: Payer: Self-pay | Admitting: Neurology

## 2019-09-03 DIAGNOSIS — E785 Hyperlipidemia, unspecified: Secondary | ICD-10-CM

## 2019-09-05 NOTE — Telephone Encounter (Signed)
Pt aware refill sent to pharmacy She does need to come in to have labs drawn

## 2019-09-05 NOTE — Telephone Encounter (Signed)
What is the name of the medication? Atorvastatin  Have you contacted your pharmacy to request a refill? Yes  Which pharmacy would you like this sent to? [name]   Patient notified that their request is being sent to the clinical staff for review and that they should receive a call once it is complete. If they do not receive a call within 24 hours they can check with their pharmacy or our office.   MMM's pt.  Please call daughter.  She is completely out of meds.

## 2019-09-07 ENCOUNTER — Other Ambulatory Visit: Payer: Self-pay

## 2019-09-07 ENCOUNTER — Other Ambulatory Visit: Payer: Medicare Other

## 2019-09-07 DIAGNOSIS — I10 Essential (primary) hypertension: Secondary | ICD-10-CM | POA: Diagnosis not present

## 2019-09-07 DIAGNOSIS — E785 Hyperlipidemia, unspecified: Secondary | ICD-10-CM | POA: Diagnosis not present

## 2019-09-08 LAB — CMP14+EGFR
ALT: 14 IU/L (ref 0–32)
AST: 22 IU/L (ref 0–40)
Albumin/Globulin Ratio: 1.5 (ref 1.2–2.2)
Albumin: 4.1 g/dL (ref 3.7–4.7)
Alkaline Phosphatase: 89 IU/L (ref 39–117)
BUN/Creatinine Ratio: 13 (ref 12–28)
BUN: 22 mg/dL (ref 8–27)
Bilirubin Total: 0.6 mg/dL (ref 0.0–1.2)
CO2: 21 mmol/L (ref 20–29)
Calcium: 9.4 mg/dL (ref 8.7–10.3)
Chloride: 108 mmol/L — ABNORMAL HIGH (ref 96–106)
Creatinine, Ser: 1.64 mg/dL — ABNORMAL HIGH (ref 0.57–1.00)
GFR calc Af Amer: 34 mL/min/{1.73_m2} — ABNORMAL LOW (ref >59)
GFR calc non Af Amer: 30 mL/min/{1.73_m2} — ABNORMAL LOW (ref >59)
Globulin, Total: 2.8 g/dL (ref 1.5–4.5)
Glucose: 77 mg/dL (ref 65–99)
Potassium: 4.4 mmol/L (ref 3.5–5.2)
Sodium: 143 mmol/L (ref 134–144)
Total Protein: 6.9 g/dL (ref 6.0–8.5)

## 2019-09-08 LAB — LIPID PANEL
Chol/HDL Ratio: 3.1 ratio (ref 0.0–4.4)
Cholesterol, Total: 150 mg/dL (ref 100–199)
HDL: 48 mg/dL (ref >39)
LDL Chol Calc (NIH): 85 mg/dL (ref 0–99)
Triglycerides: 89 mg/dL (ref 0–149)
VLDL Cholesterol Cal: 17 mg/dL (ref 5–40)

## 2019-09-23 ENCOUNTER — Ambulatory Visit: Payer: Medicare Other | Attending: Internal Medicine

## 2019-09-23 DIAGNOSIS — Z23 Encounter for immunization: Secondary | ICD-10-CM | POA: Insufficient documentation

## 2019-09-23 NOTE — Progress Notes (Signed)
   Covid-19 Vaccination Clinic  Name:  Brooke Chavez    MRN: [id number] DOB: 09-Sep-1940  09/23/2019  Brooke Chavez was observed post Covid-19 immunization for 15 minutes without incidence. She was provided with Vaccine Information Sheet and instruction to access the V-Safe system.   Brooke Chavez was instructed to call 911 with any severe reactions post vaccine: Marland Kitchen Difficulty breathing  . Swelling of your face and throat  . A fast heartbeat  . A bad rash all over your body  . Dizziness and weakness    Immunizations Administered    Name Date Dose VIS Date Route   Pfizer COVID-19 Vaccine 09/23/2019  1:05 PM 0.3 mL 07/22/2019 Intramuscular   Manufacturer: [place]   Lot: [id number]   [place]: [id number]

## 2019-10-06 ENCOUNTER — Telehealth: Payer: Self-pay | Admitting: *Deleted

## 2019-10-06 DIAGNOSIS — E785 Hyperlipidemia, unspecified: Secondary | ICD-10-CM

## 2019-10-06 MED ORDER — ATORVASTATIN CALCIUM 40 MG PO TABS: 40.0000 mg | ORAL_TABLET | Freq: Every day | ORAL | 1 refills | Status: DC

## 2019-10-06 NOTE — Addendum Note (Signed)
Addended by: [name] on: 10/06/2019 12:55 PM   Modules accepted: Orders

## 2019-10-06 NOTE — Telephone Encounter (Signed)
Fax from [facility] Atorvastatin 40 mg 1.5 tab QD Insurance will only pay for 1 QD Please advise

## 2019-10-17 ENCOUNTER — Other Ambulatory Visit: Payer: Self-pay | Admitting: Nurse Practitioner

## 2019-10-17 DIAGNOSIS — I480 Paroxysmal atrial fibrillation: Secondary | ICD-10-CM

## 2019-11-08 ENCOUNTER — Ambulatory Visit (INDEPENDENT_AMBULATORY_CARE_PROVIDER_SITE_OTHER): Payer: Medicare Other | Admitting: Nurse Practitioner

## 2019-11-08 ENCOUNTER — Encounter: Payer: Self-pay | Admitting: Nurse Practitioner

## 2019-11-08 ENCOUNTER — Other Ambulatory Visit: Payer: Self-pay

## 2019-11-08 VITALS — BP 186/65 | HR 45 | Temp 98.9°F | Resp 20 | Ht <= 58 in | Wt 166.0 lb

## 2019-11-08 DIAGNOSIS — I6930 Unspecified sequelae of cerebral infarction: Secondary | ICD-10-CM | POA: Diagnosis not present

## 2019-11-08 DIAGNOSIS — N1831 Chronic kidney disease, stage 3a: Secondary | ICD-10-CM

## 2019-11-08 DIAGNOSIS — E785 Hyperlipidemia, unspecified: Secondary | ICD-10-CM

## 2019-11-08 DIAGNOSIS — F5101 Primary insomnia: Secondary | ICD-10-CM

## 2019-11-08 DIAGNOSIS — I480 Paroxysmal atrial fibrillation: Secondary | ICD-10-CM

## 2019-11-08 DIAGNOSIS — F482 Pseudobulbar affect: Secondary | ICD-10-CM

## 2019-11-08 DIAGNOSIS — R160 Hepatomegaly, not elsewhere classified: Secondary | ICD-10-CM | POA: Diagnosis not present

## 2019-11-08 DIAGNOSIS — I10 Essential (primary) hypertension: Secondary | ICD-10-CM

## 2019-11-08 DIAGNOSIS — K8689 Other specified diseases of pancreas: Secondary | ICD-10-CM | POA: Diagnosis not present

## 2019-11-08 MED ORDER — TRAZODONE HCL 50 MG PO TABS: 50.0000 mg | ORAL_TABLET | Freq: Every day | ORAL | 1 refills | Status: DC

## 2019-11-08 MED ORDER — LOSARTAN POTASSIUM 100 MG PO TABS: 100.0000 mg | ORAL_TABLET | Freq: Every day | ORAL | 1 refills | Status: DC

## 2019-11-08 MED ORDER — SPIRONOLACTONE 25 MG PO TABS: 25.0000 mg | ORAL_TABLET | Freq: Every day | ORAL | 1 refills | Status: DC

## 2019-11-08 MED ORDER — APIXABAN 5 MG PO TABS: ORAL_TABLET | ORAL | 1 refills | Status: DC

## 2019-11-08 MED ORDER — ATORVASTATIN CALCIUM 40 MG PO TABS: 40.0000 mg | ORAL_TABLET | Freq: Every day | ORAL | 1 refills | Status: DC

## 2019-11-08 NOTE — Progress Notes (Signed)
Subjective:    Patient ID: Brooke Chavez, female    DOB: July 26, 1941, 79 y.o.   MRN: [id number]   Chief Complaint: Medical Management of Chronic Issues    HPI:  1. Essential hypertension Checks BP at home sometimes and is up and down. Denies chest pain, sob, headaches. Is watching sodium intake. BP Readings from Last 3 Encounters:  11/08/19 (!) 186/65  07/22/19 (!) 188/68  07/01/19 (!) 197/71    2. PAF (paroxysmal atrial fibrillation) ([place]) Takes eliquis daily. No problems at this time.  3. Late effect of cerebrovascular accident (CVA) Stroke in 2019 but is doing better. No falls in the last year. Uses cane when she walks. Is eating well.  4. Hyperlipidemia with target LDL less than 100 Watches fat and cholesterol intake. Lab Results  Component Value Date   CHOL 150 09/07/2019   HDL 48 09/07/2019   LDLCALC 85 09/07/2019   TRIG 89 09/07/2019   CHOLHDL 3.1 09/07/2019     5. Stage 3a chronic kidney disease No problems with voiding. Lab Results  Component Value Date   CREATININE 1.64 (H) 09/07/2019    6. Pancreatic mass Sees oncologist once a year and they are watching areas but so far there have been no changes so they are not doing any treatment.  7. PBA (pseudobulbar affect) Doing better.    Outpatient Encounter Medications as of 11/08/2019  Medication Sig  . acetaminophen (TYLENOL) 325 MG tablet Take 2 tablets (650 mg total) by mouth every 6 (six) hours as needed for mild pain (or Fever >/= 101).  Marland Kitchen atorvastatin (LIPITOR) 40 MG tablet Take 1 tablet (40 mg total) by mouth daily at 6 PM.  . ELIQUIS 5 MG TABS tablet TAKE 1 TABLET BY MOUTH TWICE DAILY . APPOINTMENT REQUIRED FOR FUTURE REFILLS  . losartan (COZAAR) 100 MG tablet Take 1 tablet (100 mg total) by mouth daily.  Marland Kitchen spironolactone (ALDACTONE) 25 MG tablet Take 1 tablet (25 mg total) by mouth daily.  . traZODone (DESYREL) 50 MG tablet Take 1 tablet (50 mg total) by mouth at bedtime.   No  facility-administered encounter medications on file as of 11/08/2019.    Past Surgical History:  Procedure Laterality Date  . A FLUTTER ABLATION N/A 06/27/2011   Procedure: ABLATION A FLUTTER;  Surgeon: Thompson Grayer, MD;  Location: [facility] CATH LAB;  Service: Cardiovascular;  Laterality: N/A;  . ATRIAL ABLATION SURGERY  06/2010  . HIP SURGERY     Left (fracture) 3/13  . JOINT REPLACEMENT     both knees   . KNEE ARTHROSCOPY     Right  . LUMBAR SPINE SURGERY    . MITRAL VALVE REPAIR Right 01/07/2018   Procedure: MINIMALLY INVASIVE MITRAL VALVE REPAIR using LivaNova ring size 28 MM;  Surgeon: Rexene Alberts, MD;  Location: [place];  Service: Open Heart Surgery;  Laterality: Right;  . PATENT FORAMEN OVALE(PFO) CLOSURE N/A 01/07/2018   Procedure: PATENT FORAMEN OVALE (PFO) CLOSURE;  Surgeon: Rexene Alberts, MD;  Location: [place];  Service: Open Heart Surgery;  Laterality: N/A;  . RIGHT/LEFT HEART CATH AND CORONARY ANGIOGRAPHY N/A 11/11/2017   Procedure: RIGHT/LEFT HEART CATH AND CORONARY ANGIOGRAPHY;  Surgeon: Sherren Mocha, MD;  Location: [place] CV LAB;  Service: Cardiovascular;  Laterality: N/A;  . TEE WITHOUT CARDIOVERSION N/A 09/18/2017   Procedure: TRANSESOPHAGEAL ECHOCARDIOGRAM (TEE);  Surgeon: Skeet Latch, MD;  Location: [place];  Service: Cardiovascular;  Laterality: N/A;  . TEE WITHOUT CARDIOVERSION N/A 01/07/2018  Procedure: TRANSESOPHAGEAL ECHOCARDIOGRAM (TEE);  Surgeon: Rexene Alberts, MD;  Location: [place];  Service: Open Heart Surgery;  Laterality: N/A;  . TOTAL KNEE ARTHROPLASTY     Left  . TUBAL LIGATION      Family History  Problem Relation Age of Onset  . Hypertension Mother   . Cancer Sister        leukemia  . Diabetes Brother   . Stroke Sister   . Diabetes Brother   . Heart attack Brother   . Healthy Daughter   . Healthy Son   . Healthy Daughter     New complaints: None  Social history: Lives with daughters and grandson who help take care of her.    Controlled substance contract: n/a     Review of Systems  Constitutional: Negative.   HENT: Negative.   Eyes: Negative.   Respiratory: Negative.   Cardiovascular: Negative.   Gastrointestinal: Negative.   Endocrine: Negative.   Genitourinary: Negative.   Musculoskeletal: Negative.   Skin: Negative.   Neurological: Negative.   Psychiatric/Behavioral: Negative.        Objective:   Physical Exam Vitals and nursing note reviewed.  HENT:     Head: Normocephalic.     Right Ear: Tympanic membrane normal.     Left Ear: Tympanic membrane normal.     Nose: Nose normal.     Mouth/Throat:     Mouth: Mucous membranes are moist.     Pharynx: Oropharynx is clear.  Eyes:     Conjunctiva/sclera: Conjunctivae normal.     Pupils: Pupils are equal, round, and reactive to light.  Cardiovascular:     Rate and Rhythm: Bradycardia present. Rhythm irregular.     Pulses: Normal pulses.     Heart sounds: Normal heart sounds.  Pulmonary:     Effort: Pulmonary effort is normal.     Breath sounds: Normal breath sounds.  Abdominal:     General: Bowel sounds are normal.     Palpations: Abdomen is soft.  Musculoskeletal:        General: Normal range of motion.     Cervical back: Normal range of motion.  Skin:    General: Skin is warm and dry.     Capillary Refill: Capillary refill takes less than 2 seconds.  Neurological:     Mental Status: She is alert. Mental status is at baseline.    BP (!) 186/65   Pulse (!) 45   Temp 98.9 F (37.2 C) (Temporal)   Resp 20   Ht 4' 10"  (1.473 m)   Wt 166 lb (75.3 kg)   LMP 11/05/1992   SpO2 99%   BMI 34.69 kg/m       Assessment & Plan:  Brooke Chavez comes in today with chief complaint of Medical Management of Chronic Issues   Diagnosis and orders addressed:  1. Essential hypertension Low sodium diet. - losartan (COZAAR) 100 MG tablet; Take 1 tablet (100 mg total) by mouth daily.  Dispense: 90 tablet; Refill: 1 - CMP14+EGFR - CBC  with Differential/Platelet  2. PAF (paroxysmal atrial fibrillation) (HCC) - apixaban (ELIQUIS) 5 MG TABS tablet; TAKE 1 TABLET BY MOUTH TWICE DAILY . APPOINTMENT REQUIRED FOR FUTURE REFILLS  Dispense: 180 tablet; Refill: 1  3. Late effect of cerebrovascular accident (CVA) - spironolactone (ALDACTONE) 25 MG tablet; Take 1 tablet (25 mg total) by mouth daily.  Dispense: 90 tablet; Refill: 1  4. Hyperlipidemia with target LDL less than 100 Low fat diet. -  atorvastatin (LIPITOR) 40 MG tablet; Take 1 tablet (40 mg total) by mouth daily at 6 PM.  Dispense: 135 tablet; Refill: 1 - Lipid panel  5. Stage 3a chronic kidney disease  6. Pancreatic mass Follow up with oncology.  7. Liver masses  8. PBA (pseudobulbar affect)  9. Primary insomnia - traZODone (DESYREL) 50 MG tablet; Take 1 tablet (50 mg total) by mouth at bedtime.  Dispense: 90 tablet; Refill: 1   Labs pending Health Maintenance reviewed Diet and exercise encouraged  Follow up plan: 6 months   Mary-Margaret Hassell Done, FNP

## 2019-11-08 NOTE — Patient Instructions (Signed)

## 2019-11-09 LAB — CBC WITH DIFFERENTIAL/PLATELET
Basophils Absolute: 0 10*3/uL (ref 0.0–0.2)
Basos: 0 %
EOS (ABSOLUTE): 0.1 10*3/uL (ref 0.0–0.4)
Eos: 1 %
Hematocrit: 36.6 % (ref 34.0–46.6)
Hemoglobin: 11.8 g/dL (ref 11.1–15.9)
Immature Grans (Abs): 0 10*3/uL (ref 0.0–0.1)
Immature Granulocytes: 0 %
Lymphocytes Absolute: 2.8 10*3/uL (ref 0.7–3.1)
Lymphs: 45 %
MCH: 31.5 pg (ref 26.6–33.0)
MCHC: 32.2 g/dL (ref 31.5–35.7)
MCV: 98 fL — ABNORMAL HIGH (ref 79–97)
Monocytes Absolute: 0.4 10*3/uL (ref 0.1–0.9)
Monocytes: 6 %
Neutrophils Absolute: 2.9 10*3/uL (ref 1.4–7.0)
Neutrophils: 48 %
Platelets: 128 10*3/uL — ABNORMAL LOW (ref 150–450)
RBC: 3.75 x10E6/uL — ABNORMAL LOW (ref 3.77–5.28)
RDW: 13.3 % (ref 11.7–15.4)
WBC: 6.1 10*3/uL (ref 3.4–10.8)

## 2019-11-09 LAB — CMP14+EGFR
ALT: 13 IU/L (ref 0–32)
AST: 24 IU/L (ref 0–40)
Albumin/Globulin Ratio: 1.5 (ref 1.2–2.2)
Albumin: 4.2 g/dL (ref 3.7–4.7)
Alkaline Phosphatase: 86 IU/L (ref 39–117)
BUN/Creatinine Ratio: 14 (ref 12–28)
BUN: 21 mg/dL (ref 8–27)
Bilirubin Total: 0.5 mg/dL (ref 0.0–1.2)
CO2: 23 mmol/L (ref 20–29)
Calcium: 9.4 mg/dL (ref 8.7–10.3)
Chloride: 108 mmol/L — ABNORMAL HIGH (ref 96–106)
Creatinine, Ser: 1.55 mg/dL — ABNORMAL HIGH (ref 0.57–1.00)
GFR calc Af Amer: 36 mL/min/{1.73_m2} — ABNORMAL LOW (ref >59)
GFR calc non Af Amer: 32 mL/min/{1.73_m2} — ABNORMAL LOW (ref >59)
Globulin, Total: 2.8 g/dL (ref 1.5–4.5)
Glucose: 94 mg/dL (ref 65–99)
Potassium: 4.6 mmol/L (ref 3.5–5.2)
Sodium: 145 mmol/L — ABNORMAL HIGH (ref 134–144)
Total Protein: 7 g/dL (ref 6.0–8.5)

## 2019-11-09 LAB — LIPID PANEL
Chol/HDL Ratio: 3.1 ratio (ref 0.0–4.4)
Cholesterol, Total: 162 mg/dL (ref 100–199)
HDL: 53 mg/dL (ref >39)
LDL Chol Calc (NIH): 89 mg/dL (ref 0–99)
Triglycerides: 114 mg/dL (ref 0–149)
VLDL Cholesterol Cal: 20 mg/dL (ref 5–40)

## 2019-11-29 ENCOUNTER — Ambulatory Visit: Payer: Medicare Other | Admitting: Physical Medicine & Rehabilitation

## 2019-12-06 ENCOUNTER — Encounter: Payer: Self-pay | Admitting: Physical Medicine & Rehabilitation

## 2019-12-06 ENCOUNTER — Encounter: Payer: Medicare Other | Attending: Physical Medicine & Rehabilitation | Admitting: Physical Medicine & Rehabilitation

## 2019-12-06 ENCOUNTER — Other Ambulatory Visit: Payer: Self-pay

## 2019-12-06 VITALS — BP 193/68 | HR 48 | Temp 98.7°F | Ht <= 58 in | Wt 164.0 lb

## 2019-12-06 DIAGNOSIS — I63512 Cerebral infarction due to unspecified occlusion or stenosis of left middle cerebral artery: Secondary | ICD-10-CM | POA: Diagnosis not present

## 2019-12-06 NOTE — Progress Notes (Signed)
Subjective:    Patient ID: Brooke Chavez, female    DOB: 11/20/1940, 79 y.o.   MRN: [id number] right-handed female with complex medical history with mitral valve disease, atrial flutter status post mitral valve repair 01/07/2018 per Dr. Roxy Manns.  She was discharged on Coumadin 01/14/2018 as well as history of  hypertension, chronic diastolic congestive heart failure.  She presented to [facility] on 01/24/2018 after being found down by family with right-sided weakness and aphasia.  She lives with her daughter.  Reported to be independent prior to  mitral valve repair.  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  She was discharged to [facility] for ongoing care.   INR upon admission of 1.7.  CT angiogram of head and neck showed left proximal M1 occlusion with thrombus measuring 8 mm in length.  Interventional radiology consulted.  No plan for intervention due to high risk of bleeding.  Echocardiogram with  ejection fraction of 55%.  Systolic function normal.  No PFO.  MRI of the brain showed large left MCA territory infarction with petechial hemorrhage, old left cerebellar infarction HPI  Patient has lived with her  daughter since her stroke, her daughter has returned to work but has a caregiver that assist the patient.  Pt uses shower chair , still requires supervision for bathing Needs help with LB dressing  No falls at home  Uses cane to ambulate  Patient remains aphasic Some crying spells but overall well controlled  Pain Inventory Average Pain 0 Pain Right Now 0 My pain is na  In the last 24 hours, has pain interfered with the following? General activity 0 Relation with others 0 Enjoyment of life 0 What TIME of day is your pain at its worst? na Sleep (in general) Fair  Pain is worse with: na Pain improves with: na Relief from Meds:  na  Mobility Do you have any goals in this area?  no  Function Do you have any goals in this area?  no  Neuro/Psych No problems in this area  Prior Studies Any changes since last visit?  no  Physicians involved in your care Any changes since last visit?  no   Family History  Problem Relation Age of Onset  . Hypertension Mother   . Cancer Sister        leukemia  . Diabetes Brother   . Stroke Sister   . Diabetes Brother   . Heart attack Brother   . Healthy Daughter   . Healthy Son   . Healthy Daughter    Social History   Socioeconomic History  . Marital status: Widowed    Spouse name: Not on file  . Number of children: 3  . Years of education: 38  . Highest education level: Not on file  Occupational History    Comment: na  Tobacco Use  . Smoking status: Never Smoker  . Smokeless tobacco: Never Used  Substance and Sexual Activity  . Alcohol use: No  . Drug use: No  . Sexual activity: Never  Other Topics Concern  . Not on file  Social History Narrative   Lives in [place]   Her daughter lives with her   Little caffeine   Social Determinants of Health   Financial Resource Strain:   . Difficulty of Paying Living Expenses:   Food Insecurity:   . Worried About [employer] of  Food in the Last Year:   . [place] in the Last Year:   Transportation Needs:   . Film/video editor (Medical):   Marland Kitchen Lack of Transportation (Non-Medical):   Physical Activity:   . Days of Exercise per Week:   . Minutes of Exercise per Session:   Stress:   . Feeling of Stress :   Social Connections:   . Frequency of Communication with Friends and Family:   . Frequency of Social Gatherings with Friends and Family:   . Attends Religious Services:   . Active Member of Clubs or Organizations:   . Attends Archivist Meetings:   Marland Kitchen Marital Status:    Past Surgical History:  Procedure Laterality Date  . A FLUTTER ABLATION N/A 06/27/2011   Procedure: ABLATION A  FLUTTER;  Surgeon: Thompson Grayer, MD;  Location: [facility] CATH LAB;  Service: Cardiovascular;  Laterality: N/A;  . ATRIAL ABLATION SURGERY  06/2010  . HIP SURGERY     Left (fracture) 3/13  . JOINT REPLACEMENT     both knees   . KNEE ARTHROSCOPY     Right  . LUMBAR SPINE SURGERY    . MITRAL VALVE REPAIR Right 01/07/2018   Procedure: MINIMALLY INVASIVE MITRAL VALVE REPAIR using LivaNova ring size 28 MM;  Surgeon: Rexene Alberts, MD;  Location: [place];  Service: Open Heart Surgery;  Laterality: Right;  . PATENT FORAMEN OVALE(PFO) CLOSURE N/A 01/07/2018   Procedure: PATENT FORAMEN OVALE (PFO) CLOSURE;  Surgeon: Rexene Alberts, MD;  Location: [place];  Service: Open Heart Surgery;  Laterality: N/A;  . RIGHT/LEFT HEART CATH AND CORONARY ANGIOGRAPHY N/A 11/11/2017   Procedure: RIGHT/LEFT HEART CATH AND CORONARY ANGIOGRAPHY;  Surgeon: Sherren Mocha, MD;  Location: [place] CV LAB;  Service: Cardiovascular;  Laterality: N/A;  . TEE WITHOUT CARDIOVERSION N/A 09/18/2017   Procedure: TRANSESOPHAGEAL ECHOCARDIOGRAM (TEE);  Surgeon: Skeet Latch, MD;  Location: [place];  Service: Cardiovascular;  Laterality: N/A;  . TEE WITHOUT CARDIOVERSION N/A 01/07/2018   Procedure: TRANSESOPHAGEAL ECHOCARDIOGRAM (TEE);  Surgeon: Rexene Alberts, MD;  Location: [place];  Service: Open Heart Surgery;  Laterality: N/A;  . TOTAL KNEE ARTHROPLASTY     Left  . TUBAL LIGATION     Past Medical History:  Diagnosis Date  . Atrial flutter ([place])    Typical by EKG diagnosis 9/11 s/p CIT ablation 11/11  . Bradycardia   . Chronic diastolic congestive heart failure ([place])   . 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 DJD (degenerative joint disease)   . Dysrhythmia   . Femur fracture, left ([place]) 2013  . Global aphasia   . Hyperlipidemia    x5 years  . Hypertension    Since 1997  . 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 Liver  masses 11/13/2017   Multiple small nodules seen on CT and MRI  . Mitral regurgitation    severe  . 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 Stroke ([place])   . TR (tricuspid regurgitation)    Mild with RA enlargment   BP (!) 193/68   Pulse (!) 48   Temp 98.7 F (37.1 C)   Ht 4\' 10"  (1.473 m)  Wt 164 lb (74.4 kg)   LMP 11/05/1992   SpO2 93%   BMI 34.28 kg/m   Opioid Risk Score:   Fall Risk Score:  `1  Depression screen PHQ 2/9  Depression screen [facility] 2/9 11/08/2019 07/22/2019 06/21/2019 09/21/2018 08/17/2018 06/21/2018 03/05/2018  Decreased Interest 0 0 0 0 0 0 0  Down, Depressed, Hopeless 0 0 0 0 0 0 0  PHQ - 2 Score 0 0 0 0 0 0 0  Altered sleeping - - - - - - 0  Tired, decreased energy - - - - - - 0  Change in appetite - - - - - - 0  Feeling bad or failure about yourself  - - - - - - 0  Trouble concentrating - - - - - - 0  Moving slowly or fidgety/restless - - - - - - 0  Suicidal thoughts - - - - - - 0  PHQ-9 Score - - - - - - 0  Some recent data might be hidden     Review of Systems  All other systems reviewed and are negative.      Objective:   Physical Exam Vitals and nursing note reviewed.  Constitutional:      Appearance: Normal appearance.  HENT:     Head: Normocephalic and atraumatic.     Mouth/Throat:     Mouth: Mucous membranes are dry.  Eyes:     Extraocular Movements: Extraocular movements intact.     Conjunctiva/sclera: Conjunctivae normal.     Pupils: Pupils are equal, round, and reactive to light.  Skin:    General: Skin is warm and dry.  Neurological:     General: No focal deficit present.     Mental Status: She is alert and oriented to person, place, and time.     Cranial Nerves: Dysarthria present.     Motor: Weakness present.     Coordination:  Coordination abnormal.     Gait: Gait abnormal.     Comments: Patient has receptive and expressive language deficits Positive apraxia has difficulty even following visual cues for manual muscle testing Motor strength is 5/5 left upper and left lower extremity 4/5 right upper and right lower extremity Sensation cannot assess secondary to aphasia and dysarthria  Patient has difficulty with right upper extremity coordination although this may be mainly apraxia  Requires a cane for ambulation forward flexed posture small step length with widened base is           Assessment & Plan:  #1.  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  As discussed with the daughter she has plateaued in terms of her recovery.  Overall she is doing quite well still at a supervision level which I think she will need for the rest of her life. The patient will follow-up with neurology as well as primary care for secondary stroke prophylaxis Physical medicine rehab follow-up on as-needed basis.

## 2019-12-06 NOTE — Patient Instructions (Signed)
Call for appt

## 2019-12-27 ENCOUNTER — Inpatient Hospital Stay: Payer: Medicare Other

## 2019-12-27 ENCOUNTER — Inpatient Hospital Stay: Payer: Medicare Other | Attending: Oncology | Admitting: Oncology

## 2019-12-27 ENCOUNTER — Other Ambulatory Visit: Payer: Self-pay

## 2019-12-27 VITALS — BP 158/90 | HR 73 | Temp 98.1°F | Resp 17 | Ht <= 58 in

## 2019-12-27 DIAGNOSIS — Z8673 Personal history of transient ischemic attack (TIA), and cerebral infarction without residual deficits: Secondary | ICD-10-CM | POA: Diagnosis not present

## 2019-12-27 DIAGNOSIS — K869 Disease of pancreas, unspecified: Secondary | ICD-10-CM | POA: Insufficient documentation

## 2019-12-27 DIAGNOSIS — I63512 Cerebral infarction due to unspecified occlusion or stenosis of left middle cerebral artery: Secondary | ICD-10-CM | POA: Diagnosis not present

## 2019-12-27 DIAGNOSIS — I10 Essential (primary) hypertension: Secondary | ICD-10-CM | POA: Insufficient documentation

## 2019-12-27 DIAGNOSIS — K8689 Other specified diseases of pancreas: Secondary | ICD-10-CM | POA: Diagnosis not present

## 2019-12-27 DIAGNOSIS — N289 Disorder of kidney and ureter, unspecified: Secondary | ICD-10-CM | POA: Diagnosis not present

## 2019-12-27 DIAGNOSIS — I4892 Unspecified atrial flutter: Secondary | ICD-10-CM | POA: Insufficient documentation

## 2019-12-27 NOTE — Progress Notes (Signed)
  [place] OFFICE PROGRESS NOTE   Diagnosis: Pancreas mass  INTERVAL HISTORY:   Ms. Lukes returns as scheduled.  She is here with her daughter.  No complaint.  Good appetite.  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  She has received the COVID-19 vaccine.  No abdominal pain.  No shortness of breath.  Objective:  Vital signs in last 24 hours:  Blood pressure (!) 184/96, pulse 73, temperature 98.1 F (36.7 C), temperature source Oral, resp. rate 17, height 4\' 10"  (1.473 m), last menstrual period 11/05/1992, SpO2 100 %.    Lymphatics: No cervical, supraclavicular, axillary, or inguinal nodes Resp: Decreased breath sounds at the right compared to left chest, no respiratory distress Cardio: Regular rate and rhythm GI: No mass, nontender, no hepatosplenomegaly Vascular: No leg edema Neuro: Expressive aphasia, ambulates with assistance   Lab Results:  Lab Results  Component Value Date   WBC 6.1 11/08/2019   HGB 11.8 11/08/2019   HCT 36.6 11/08/2019   MCV 98 (H) 11/08/2019   PLT 128 (L) 11/08/2019   NEUTROABS 2.9 11/08/2019    CMP  Lab Results  Component Value Date   NA 145 (H) 11/08/2019   K 4.6 11/08/2019   CL 108 (H) 11/08/2019   CO2 23 11/08/2019   GLUCOSE 94 11/08/2019   BUN 21 11/08/2019   CREATININE 1.55 (H) 11/08/2019   CALCIUM 9.4 11/08/2019   PROT 7.0 11/08/2019   ALBUMIN 4.2 11/08/2019   AST 24 11/08/2019   ALT 13 11/08/2019   ALKPHOS 86 11/08/2019   BILITOT 0.5 11/08/2019   GFRNONAA 32 (L) 11/08/2019   GFRAA 36 (L) 11/08/2019     Medications: I have reviewed the patient's current medications.   Assessment/Plan: 1. Pancreas mass  0.8 cm hypervascular lesion in the pancreas body and multiple enhancing liver lesions noted on CT 11/13/2017  MRI 11/21/2017-hypervascular liver lesions evident on arterial phase, pancreas lesion not identified  [facility] 11/30/2017- intense uptake at the pancreas tail lesion  consistent with a primary neuroendocrine neoplasm, no uptake in the liver above background  CT 06/29/2019-unchanged pancreas tail lesion multiple low-attenuation liver lesions-largest lesions were characterized as cysts and hemangiomata by MRI, small hyperenhancing lesion seen by MRI are not appreciated by CT 2. Atrial flutter 3. 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 Left MCA CVA 01/24/2018 5. Hypertension 6. Renal insufficiency    Disposition: Ms. Spielberg appears stable.  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  The plan is to continue observation.  She will be scheduled for a chemistry panel and MRI of the abdomen in 6 months.  She will return for an office visit the same day.   She will contact us in the interim as needed.  Betsy Coder, MD  12/27/2019  3:24 PM

## 2019-12-28 ENCOUNTER — Telehealth: Payer: Self-pay | Admitting: Oncology

## 2019-12-28 NOTE — Telephone Encounter (Signed)
Scheduled per los. Called and left msg. Mailed printout  °

## 2020-01-04 ENCOUNTER — Ambulatory Visit: Payer: Medicare Other

## 2020-01-04 ENCOUNTER — Telehealth: Payer: Self-pay | Admitting: Nurse Practitioner

## 2020-01-04 NOTE — Telephone Encounter (Signed)
appt made with Dr, Warrick Parisian 01/05/20

## 2020-01-05 ENCOUNTER — Ambulatory Visit (INDEPENDENT_AMBULATORY_CARE_PROVIDER_SITE_OTHER): Payer: Medicare Other | Admitting: Family Medicine

## 2020-01-05 ENCOUNTER — Other Ambulatory Visit: Payer: Self-pay

## 2020-01-05 ENCOUNTER — Encounter: Payer: Self-pay | Admitting: Family Medicine

## 2020-01-05 VITALS — BP 122/66 | HR 64 | Temp 97.8°F | Resp 18 | Ht <= 58 in | Wt 164.0 lb

## 2020-01-05 DIAGNOSIS — I480 Paroxysmal atrial fibrillation: Secondary | ICD-10-CM

## 2020-01-05 DIAGNOSIS — I10 Essential (primary) hypertension: Secondary | ICD-10-CM | POA: Diagnosis not present

## 2020-01-05 DIAGNOSIS — R609 Edema, unspecified: Secondary | ICD-10-CM | POA: Diagnosis not present

## 2020-01-05 DIAGNOSIS — I6930 Unspecified sequelae of cerebral infarction: Secondary | ICD-10-CM | POA: Diagnosis not present

## 2020-01-05 DIAGNOSIS — R600 Localized edema: Secondary | ICD-10-CM | POA: Diagnosis not present

## 2020-01-05 NOTE — Progress Notes (Signed)
BP 122/66   Pulse 64   Temp 97.8 F (36.6 C)   Resp 18   Ht 4' 10"  (1.473 m)   Wt 164 lb (74.4 kg)   LMP 11/05/1992   SpO2 96%   BMI 34.28 kg/m    Subjective:   Patient ID: Brooke Chavez, female    DOB: Jun 21, 1941, 79 y.o.   MRN: [id number]  HPI: Brooke Chavez is a 79 y.o. female presenting on 01/05/2020 for Edema (bilateral lower extrem. )   HPI Patient is calling in today with bilateral lower extremity edema that she has been having increasingly over the past couple years.  She has been having it since she had a stroke in 2019.  She does get the swelling more during the daytime and then it goes down at night when she puts her feet up.  She denies any shortness of breath or wheezing.  She did have an echocardiogram in 2019 which was normal with the 55% ejection fraction.Daughter could not tell but she was concerned that she may be having some pain with it and that is what she was concerned about.  Relevant past medical, surgical, family and social history reviewed and updated as indicated. Interim medical history since our last visit reviewed. Allergies and medications reviewed and updated.  Review of Systems  Constitutional: Negative for chills and fever.  Eyes: Negative for redness and visual disturbance.  Respiratory: Negative for chest tightness, shortness of breath and wheezing.   Cardiovascular: Positive for leg swelling. Negative for chest pain and palpitations.  Musculoskeletal: Negative for back pain and gait problem.  Skin: Negative for rash.  Neurological: Negative for light-headedness and headaches.  Psychiatric/Behavioral: Negative for agitation and behavioral problems.  All other systems reviewed and are negative.   Per HPI unless specifically indicated above   Allergies as of 01/05/2020      Reactions   Oxycodone-acetaminophen Anxiety, Other (See Comments)   Hallucinations   Aspirin Nausea And Vomiting, Nausea Only   Other reaction(s): GI Upset  (intolerance)      Medication List       Accurate as of Jan 05, 2020  4:49 PM. If you have any questions, ask your nurse or doctor.        acetaminophen 325 MG tablet Commonly known as: TYLENOL Take 2 tablets (650 mg total) by mouth every 6 (six) hours as needed for mild pain (or Fever >/= 101).   apixaban 5 MG Tabs tablet Commonly known as: Eliquis TAKE 1 TABLET BY MOUTH TWICE DAILY . APPOINTMENT REQUIRED FOR FUTURE REFILLS   atorvastatin 40 MG tablet Commonly known as: LIPITOR Take 1 tablet (40 mg total) by mouth daily at 6 PM.   losartan 100 MG tablet Commonly known as: COZAAR Take 1 tablet (100 mg total) by mouth daily.   spironolactone 25 MG tablet Commonly known as: ALDACTONE Take 1 tablet (25 mg total) by mouth daily.   traZODone 50 MG tablet Commonly known as: DESYREL Take 1 tablet (50 mg total) by mouth at bedtime.        Objective:   BP 122/66   Pulse 64   Temp 97.8 F (36.6 C)   Resp 18   Ht 4' 10"  (1.473 m)   Wt 164 lb (74.4 kg)   LMP 11/05/1992   SpO2 96%   BMI 34.28 kg/m   Wt Readings from Last 3 Encounters:  01/05/20 164 lb (74.4 kg)  12/06/19 164 lb (74.4 kg)  11/08/19 166 lb (  75.3 kg)    Physical Exam Vitals and nursing note reviewed.  Constitutional:      General: She is not in acute distress.    Appearance: She is well-developed. She is not diaphoretic.  Eyes:     Conjunctiva/sclera: Conjunctivae normal.  Cardiovascular:     Rate and Rhythm: Normal rate and regular rhythm.     Heart sounds: Normal heart sounds. No murmur.  Pulmonary:     Effort: Pulmonary effort is normal. No respiratory distress.     Breath sounds: Normal breath sounds. No wheezing.  Musculoskeletal:        General: Swelling (1+ pitting edema bilateral lower extremity, worse on around the ankles, 2+ around the ankles) present. No tenderness. Normal range of motion.  Skin:    General: Skin is warm and dry.     Findings: No rash.  Neurological:     Mental  Status: She is alert and oriented to person, place, and time.     Coordination: Coordination normal.  Psychiatric:        Behavior: Behavior normal.      Assessment & Plan:   Problem List Items Addressed This Visit      Cardiovascular and Mediastinum   Essential hypertension (Chronic)   Relevant Orders   Brain natriuretic peptide   PAF (paroxysmal atrial fibrillation) (HCC)   Relevant Orders   Brain natriuretic peptide    Other Visit Diagnoses    Edema, unspecified type    -  Primary   Relevant Orders   Brain natriuretic peptide   CMP14+EGFR   Compression stockings      Will check for BNP and CMP to make sure nothing is worsened but if everything is okay then recommended compression stockings.  Unlikely to have DVT because already on Xarelto. Follow up plan: Return if symptoms worsen or fail to improve.  Counseling provided for all of the vaccine components No orders of the defined types were placed in this encounter.   Caryl Pina, MD [facility] 01/05/2020, 4:49 PM

## 2020-01-06 LAB — CMP14+EGFR
ALT: 14 IU/L (ref 0–32)
AST: 21 IU/L (ref 0–40)
Albumin/Globulin Ratio: 1.4 (ref 1.2–2.2)
Albumin: 3.9 g/dL (ref 3.7–4.7)
Alkaline Phosphatase: 89 IU/L (ref 48–121)
BUN/Creatinine Ratio: 16 (ref 12–28)
BUN: 27 mg/dL (ref 8–27)
Bilirubin Total: 0.4 mg/dL (ref 0.0–1.2)
CO2: 20 mmol/L (ref 20–29)
Calcium: 9.1 mg/dL (ref 8.7–10.3)
Chloride: 111 mmol/L — ABNORMAL HIGH (ref 96–106)
Creatinine, Ser: 1.64 mg/dL — ABNORMAL HIGH (ref 0.57–1.00)
GFR calc Af Amer: 34 mL/min/{1.73_m2} — ABNORMAL LOW (ref >59)
GFR calc non Af Amer: 30 mL/min/{1.73_m2} — ABNORMAL LOW (ref >59)
Globulin, Total: 2.8 g/dL (ref 1.5–4.5)
Glucose: 83 mg/dL (ref 65–99)
Potassium: 4.5 mmol/L (ref 3.5–5.2)
Sodium: 144 mmol/L (ref 134–144)
Total Protein: 6.7 g/dL (ref 6.0–8.5)

## 2020-01-06 LAB — BRAIN NATRIURETIC PEPTIDE: BNP: 426.7 pg/mL — ABNORMAL HIGH (ref 0.0–100.0)

## 2020-01-11 ENCOUNTER — Other Ambulatory Visit: Payer: Self-pay

## 2020-01-11 ENCOUNTER — Telehealth: Payer: Self-pay | Admitting: Nurse Practitioner

## 2020-01-11 ENCOUNTER — Ambulatory Visit (INDEPENDENT_AMBULATORY_CARE_PROVIDER_SITE_OTHER): Payer: Medicare Other | Admitting: *Deleted

## 2020-01-11 DIAGNOSIS — Z111 Encounter for screening for respiratory tuberculosis: Secondary | ICD-10-CM | POA: Diagnosis not present

## 2020-01-11 NOTE — Telephone Encounter (Signed)
Spoke with pt's daughter and advised MMM off today and she says she dropped off some paperwork on 12/30/19 and hasn't gotten a call to let her know if it is ready or not. Advised her I would speak with MMM nurse [facility] to see if she has seen the forms and get back to her.

## 2020-01-11 NOTE — Telephone Encounter (Signed)
Daughter [name] calling to checkup on ppw for [place] and pt will be coming in to for TB TEST. Do we have to wait for tb test to done before faxing over? Pt is coming in today for the tb shot. [name] also needs DNR to be faxed with the ppw. Please call her back or let family member coming with pt know.

## 2020-01-11 NOTE — Telephone Encounter (Signed)
Patient aware MMM off today will forward to pcp.

## 2020-01-11 NOTE — Progress Notes (Signed)
PPD placed on left forearm intradermal and pt tolerated well.

## 2020-01-11 NOTE — Telephone Encounter (Signed)
Pt got a letter for jury duty but needs a note from PCP to be excused due to medical conditions. Daughter [name] can come by today between 12:00-1:00 if ready. Daughter aware MMM is off. Please call her when ready and if no answer please leave a vm.

## 2020-01-12 NOTE — Telephone Encounter (Signed)
Paperwork found and left up front along with jury duty note for patient. Patients daughter notified

## 2020-01-13 ENCOUNTER — Ambulatory Visit: Payer: Medicare Other

## 2020-01-13 ENCOUNTER — Other Ambulatory Visit: Payer: Self-pay

## 2020-01-13 DIAGNOSIS — Z111 Encounter for screening for respiratory tuberculosis: Secondary | ICD-10-CM

## 2020-01-13 LAB — TB SKIN TEST
Induration: 0 mm
TB Skin Test: NEGATIVE

## 2020-01-13 NOTE — Progress Notes (Signed)
Tb skin test read and documented - Neg

## 2020-01-17 ENCOUNTER — Telehealth: Payer: Self-pay | Admitting: Nurse Practitioner

## 2020-01-17 NOTE — Telephone Encounter (Signed)
Form placed on providers desk 

## 2020-01-19 NOTE — Telephone Encounter (Signed)
Please give form to nurse so family can be called.  Thanks

## 2020-01-19 NOTE — Telephone Encounter (Signed)
LMOVM form is ready to be picked up

## 2020-02-10 ENCOUNTER — Telehealth: Payer: Self-pay | Admitting: *Deleted
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
## 2020-02-20 ENCOUNTER — Telehealth: Payer: Self-pay | Admitting: Nurse Practitioner
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
## 2020-02-21 NOTE — Telephone Encounter (Signed)
Pt scheduled to come in with MMM 02/22/20 at 2:15 and daughter did say she didn't have any other episodes so far where she was hard to "wake".

## 2020-02-22 ENCOUNTER — Other Ambulatory Visit: Payer: Self-pay

## 2020-02-22 ENCOUNTER — Ambulatory Visit (INDEPENDENT_AMBULATORY_CARE_PROVIDER_SITE_OTHER): Payer: Medicare Other | Admitting: Nurse Practitioner

## 2020-02-22 ENCOUNTER — Encounter: Payer: Self-pay | Admitting: Nurse Practitioner

## 2020-02-22 VITALS — BP 145/82 | HR 45 | Temp 98.3°F | Resp 20 | Ht <= 58 in | Wt 169.0 lb

## 2020-02-22 DIAGNOSIS — I6930 Unspecified sequelae of cerebral infarction: Secondary | ICD-10-CM

## 2020-02-22 DIAGNOSIS — R55 Syncope and collapse: Secondary | ICD-10-CM | POA: Diagnosis not present

## 2020-02-22 DIAGNOSIS — R609 Edema, unspecified: Secondary | ICD-10-CM

## 2020-02-22 MED ORDER — FUROSEMIDE 40 MG PO TABS: 40.0000 mg | ORAL_TABLET | Freq: Every day | ORAL | 3 refills | Status: DC

## 2020-02-22 NOTE — Patient Instructions (Signed)
Peripheral Edema  Peripheral edema is swelling that is caused by a buildup of fluid. Peripheral edema most often affects the lower legs, ankles, and feet. It can also develop in the arms, hands, and face. The area of the body that has peripheral edema will look swollen. It may also feel heavy or warm. Your clothes may start to feel tight. Pressing on the area may make a temporary dent in your skin. You may not be able to move your swollen arm or leg as much as usual. There are many causes of peripheral edema. It can happen because of a complication of other conditions such as congestive heart failure, kidney disease, or a problem with your blood circulation. It also can be a side effect of certain medicines or because of an infection. It often happens to women during pregnancy. Sometimes, the cause is not known. Follow these instructions at home: Managing pain, stiffness, and swelling   Raise (elevate) your legs while you are sitting or lying down.  Move around often to prevent stiffness and to lessen swelling.  Do not sit or stand for long periods of time.  Wear support stockings as told by your health care provider. Medicines  Take over-the-counter and prescription medicines only as told by your health care provider.  Your health care provider may prescribe medicine to help your body get rid of excess water (diuretic). General instructions  Pay attention to any changes in your symptoms.  Follow instructions from your health care provider about limiting salt (sodium) in your diet. Sometimes, eating less salt may reduce swelling.  Moisturize skin daily to help prevent skin from cracking and draining.  Keep all follow-up visits as told by your health care provider. This is important. Contact a health care provider if you have:  A fever.  Edema that starts suddenly or is getting worse, especially if you are pregnant or have a medical condition.  Swelling in only one leg.  Increased  swelling, redness, or pain in one or both of your legs.  Drainage or sores at the area where you have edema. Get help right away if you:  Develop shortness of breath, especially when you are lying down.  Have pain in your chest or abdomen.  Feel weak.  Feel faint. Summary  Peripheral edema is swelling that is caused by a buildup of fluid. Peripheral edema most often affects the lower legs, ankles, and feet.  Move around often to prevent stiffness and to lessen swelling. Do not sit or stand for long periods of time.  Pay attention to any changes in your symptoms.  Contact a health care provider if you have edema that starts suddenly or is getting worse, especially if you are pregnant or have a medical condition.  Get help right away if you develop shortness of breath, especially when lying down. This information is not intended to replace advice given to you by your health care provider. Make sure you discuss any questions you have with your health care provider. Document Revised: 04/21/2018 Document Reviewed: 04/21/2018 Elsevier Patient Education  2020 [employer]  

## 2020-02-22 NOTE — Progress Notes (Signed)
Subjective:    Patient ID: Brooke Chavez, female    DOB: Dec 27, 1940, 79 y.o.   MRN: [id number]   Chief Complaint: Urinary Tract Infection   HPI Sunday night patient was asleep on couch and was very difficult to arouse. They called 911 and they came out and check her and she was fine. After they were able to arouse her she seemed fine. Have noticed this before.    Review of Systems  Constitutional: Negative for fever.  HENT: Negative.   Eyes: Negative for pain.  Respiratory: Negative for shortness of breath.   Cardiovascular: Negative for chest pain, palpitations and leg swelling.  Gastrointestinal: Negative for abdominal pain.  Endocrine: Negative for polydipsia.  Skin: Negative for rash.  Neurological: Negative for dizziness, weakness and headaches.  Hematological: Does not bruise/bleed easily.  Psychiatric/Behavioral: The patient is not nervous/anxious.   All other systems reviewed and are negative.      Objective:   Physical Exam Vitals and nursing note reviewed.  Constitutional:      General: She is not in acute distress.    Appearance: Normal appearance. She is well-developed.  HENT:     Head: Normocephalic.     Nose: Nose normal.  Eyes:     Pupils: Pupils are equal, round, and reactive to light.  Neck:     Vascular: No carotid bruit or JVD.  Cardiovascular:     Rate and Rhythm: Normal rate and regular rhythm.     Heart sounds: Normal heart sounds.  Pulmonary:     Effort: Pulmonary effort is normal. No respiratory distress.     Breath sounds: Normal breath sounds. No wheezing or rales.  Chest:     Chest wall: No tenderness.  Abdominal:     General: Bowel sounds are normal. There is no distension or abdominal bruit.     Palpations: Abdomen is soft. There is no hepatomegaly, splenomegaly, mass or pulsatile mass.     Tenderness: There is no abdominal tenderness.  Musculoskeletal:        General: Normal range of motion.     Cervical back: Normal range of  motion and neck supple.     Right lower leg: Edema (2+) present.     Left lower leg: Edema (2+) present.  Lymphadenopathy:     Cervical: No cervical adenopathy.  Skin:    General: Skin is warm and dry.  Neurological:     Mental Status: She is alert and oriented to person, place, and time.     Deep Tendon Reflexes: Reflexes are normal and symmetric.  Psychiatric:        Behavior: Behavior normal.        Thought Content: Thought content normal.        Judgment: Judgment normal.     BP (!) 145/82 (BP Location: Left Arm)   Pulse (!) 45   Temp 98.3 F (36.8 C) (Temporal)   Resp 20   Ht 4' 10"  (1.473 m)   Wt 169 lb (76.7 kg)   LMP 11/05/1992   SpO2 97%   BMI 35.32 kg/m         Assessment & Plan:  Brooke Chavez in today with chief complaint of Urinary Tract Infection   1. Syncope, unspecified syncope type Labs pending - Urinalysis, Complete - CBC with Differential/Platelet - CMP14+EGFR  2. Peripheral edema Stop spirolactone Changes to lasix Elevate legs when sitting - furosemide (LASIX) 40 MG tablet; Take 1 tablet (40 mg total) by mouth daily.  Dispense: 30 tablet; Refill: 3    The above assessment and management plan was discussed with the patient. The patient verbalized understanding of and has agreed to the management plan. Patient is aware to call the clinic if symptoms persist or worsen. Patient is aware when to return to the clinic for a follow-up visit. Patient educated on when it is appropriate to go to the emergency department.   Mary-Margaret Hassell Done, FNP

## 2020-02-23 ENCOUNTER — Other Ambulatory Visit: Payer: Medicare Other

## 2020-02-23 DIAGNOSIS — R55 Syncope and collapse: Secondary | ICD-10-CM | POA: Diagnosis not present

## 2020-02-23 LAB — CMP14+EGFR
ALT: 13 IU/L (ref 0–32)
AST: 25 IU/L (ref 0–40)
Albumin/Globulin Ratio: 1.4 (ref 1.2–2.2)
Albumin: 4.1 g/dL (ref 3.7–4.7)
Alkaline Phosphatase: 82 IU/L (ref 48–121)
BUN/Creatinine Ratio: 10 — ABNORMAL LOW (ref 12–28)
BUN: 16 mg/dL (ref 8–27)
Bilirubin Total: 0.6 mg/dL (ref 0.0–1.2)
CO2: 18 mmol/L — ABNORMAL LOW (ref 20–29)
Calcium: 9.3 mg/dL (ref 8.7–10.3)
Chloride: 107 mmol/L — ABNORMAL HIGH (ref 96–106)
Creatinine, Ser: 1.67 mg/dL — ABNORMAL HIGH (ref 0.57–1.00)
GFR calc Af Amer: 33 mL/min/{1.73_m2} — ABNORMAL LOW (ref >59)
GFR calc non Af Amer: 29 mL/min/{1.73_m2} — ABNORMAL LOW (ref >59)
Globulin, Total: 2.9 g/dL (ref 1.5–4.5)
Glucose: 76 mg/dL (ref 65–99)
Potassium: 5.3 mmol/L — ABNORMAL HIGH (ref 3.5–5.2)
Sodium: 143 mmol/L (ref 134–144)
Total Protein: 7 g/dL (ref 6.0–8.5)

## 2020-02-23 LAB — CBC WITH DIFFERENTIAL/PLATELET
Basophils Absolute: 0 10*3/uL (ref 0.0–0.2)
Basos: 0 %
EOS (ABSOLUTE): 0.1 10*3/uL (ref 0.0–0.4)
Eos: 1 %
Hematocrit: 34.1 % (ref 34.0–46.6)
Hemoglobin: 11.2 g/dL (ref 11.1–15.9)
Immature Grans (Abs): 0 10*3/uL (ref 0.0–0.1)
Immature Granulocytes: 0 %
Lymphocytes Absolute: 2.5 10*3/uL (ref 0.7–3.1)
Lymphs: 44 %
MCH: 31.9 pg (ref 26.6–33.0)
MCHC: 32.8 g/dL (ref 31.5–35.7)
MCV: 97 fL (ref 79–97)
Monocytes Absolute: 0.4 10*3/uL (ref 0.1–0.9)
Monocytes: 6 %
Neutrophils Absolute: 2.7 10*3/uL (ref 1.4–7.0)
Neutrophils: 49 %
Platelets: 192 10*3/uL (ref 150–450)
RBC: 3.51 x10E6/uL — ABNORMAL LOW (ref 3.77–5.28)
RDW: 13.5 % (ref 11.7–15.4)
WBC: 5.6 10*3/uL (ref 3.4–10.8)

## 2020-02-24 LAB — MICROSCOPIC EXAMINATION
Bacteria, UA: NONE SEEN
Casts: NONE SEEN /lpf
Epithelial Cells (non renal): NONE SEEN /hpf (ref 0–10)
RBC, Urine: NONE SEEN /hpf (ref 0–2)

## 2020-02-24 LAB — URINALYSIS, COMPLETE
Bilirubin, UA: NEGATIVE
Glucose, UA: NEGATIVE
Ketones, UA: NEGATIVE
Leukocytes,UA: NEGATIVE
Nitrite, UA: NEGATIVE
Protein,UA: NEGATIVE
RBC, UA: NEGATIVE
Specific Gravity, UA: 1.009 (ref 1.005–1.030)
Urobilinogen, Ur: 0.2 mg/dL (ref 0.2–1.0)
pH, UA: 6 (ref 5.0–7.5)

## 2020-03-05 ENCOUNTER — Telehealth: Payer: Self-pay | Admitting: Nurse Practitioner

## 2020-03-05 DIAGNOSIS — E785 Hyperlipidemia, unspecified: Secondary | ICD-10-CM

## 2020-03-05 MED ORDER — ATORVASTATIN CALCIUM 40 MG PO TABS: 60.0000 mg | ORAL_TABLET | Freq: Every day | ORAL | 1 refills | Status: DC

## 2020-03-05 NOTE — Telephone Encounter (Signed)
Pt's daughter states she thought her mom was supposed to be taking 1.5 tabs of the atorvastatin and the dispense # is 135. Please clarify.

## 2020-03-05 NOTE — Telephone Encounter (Signed)
Daughter aware.

## 2020-03-05 NOTE — Telephone Encounter (Signed)
Pt's daughter is aware 

## 2020-03-05 NOTE — Telephone Encounter (Signed)
Corected prescription and resent to pharmacy

## 2020-03-15 ENCOUNTER — Telehealth: Payer: Self-pay | Admitting: Nurse Practitioner

## 2020-03-15 NOTE — Telephone Encounter (Signed)
Insurance will not cover 1 and 1/2.

## 2020-03-16 ENCOUNTER — Other Ambulatory Visit: Payer: Self-pay | Admitting: Nurse Practitioner

## 2020-03-16 DIAGNOSIS — E785 Hyperlipidemia, unspecified: Secondary | ICD-10-CM

## 2020-03-16 MED ORDER — ATORVASTATIN CALCIUM 40 MG PO TABS: 40.0000 mg | ORAL_TABLET | Freq: Every day | ORAL | 1 refills | Status: DC

## 2020-03-16 NOTE — Telephone Encounter (Signed)
Pt's daughter aware of provider feedback and voiced understanding.

## 2020-03-16 NOTE — Telephone Encounter (Signed)
It will be fine for patient to just take a 40mg  tablet daily- I will send in new rx.

## 2020-04-13 ENCOUNTER — Other Ambulatory Visit: Payer: Self-pay | Admitting: Nurse Practitioner

## 2020-04-13 DIAGNOSIS — F5101 Primary insomnia: Secondary | ICD-10-CM

## 2020-04-16 DIAGNOSIS — R55 Syncope and collapse: Secondary | ICD-10-CM | POA: Diagnosis not present

## 2020-04-16 DIAGNOSIS — R4020 Unspecified coma: Secondary | ICD-10-CM | POA: Diagnosis not present

## 2020-04-16 DIAGNOSIS — R531 Weakness: Secondary | ICD-10-CM | POA: Diagnosis not present

## 2020-04-16 DIAGNOSIS — I10 Essential (primary) hypertension: Secondary | ICD-10-CM | POA: Diagnosis not present

## 2020-04-16 DIAGNOSIS — R0689 Other abnormalities of breathing: Secondary | ICD-10-CM | POA: Diagnosis not present

## 2020-04-17 ENCOUNTER — Telehealth: Payer: Self-pay | Admitting: *Deleted
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
## 2020-04-19 ENCOUNTER — Telehealth: Payer: Self-pay | Admitting: Nurse Practitioner

## 2020-04-19 NOTE — Telephone Encounter (Signed)
Do you think she is just very anxious?
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
## 2020-04-19 NOTE — Telephone Encounter (Signed)
I advised this patient's daughter to take her to the ER yesterday and she did not. Do you have a different recommendation?

## 2020-04-20 ENCOUNTER — Telehealth: Payer: Self-pay | Admitting: Nurse Practitioner

## 2020-04-20 ENCOUNTER — Other Ambulatory Visit: Payer: Self-pay | Admitting: Nurse Practitioner

## 2020-04-20 DIAGNOSIS — F419 Anxiety disorder, unspecified: Secondary | ICD-10-CM

## 2020-04-20 MED ORDER — HYDROXYZINE HCL 25 MG PO TABS: ORAL_TABLET | ORAL | 0 refills | Status: DC

## 2020-04-20 NOTE — Telephone Encounter (Signed)
Sent in prescription for atara to seeif wil calm her down.

## 2020-04-20 NOTE — Telephone Encounter (Signed)
Please review telephone call from yesterday and advise.

## 2020-04-24 ENCOUNTER — Other Ambulatory Visit: Payer: Self-pay

## 2020-04-24 ENCOUNTER — Encounter: Payer: Self-pay | Admitting: Nurse Practitioner

## 2020-04-24 ENCOUNTER — Ambulatory Visit (INDEPENDENT_AMBULATORY_CARE_PROVIDER_SITE_OTHER): Payer: Medicare Other | Admitting: Nurse Practitioner

## 2020-04-24 VITALS — BP 140/78 | HR 47 | Temp 98.1°F | Resp 20 | Ht <= 58 in | Wt 165.0 lb

## 2020-04-24 DIAGNOSIS — I6782 Cerebral ischemia: Secondary | ICD-10-CM | POA: Diagnosis not present

## 2020-04-24 DIAGNOSIS — I6930 Unspecified sequelae of cerebral infarction: Secondary | ICD-10-CM

## 2020-04-24 NOTE — Progress Notes (Signed)
   Subjective:    Patient ID: Brooke Chavez, female    DOB: [birth year], 79 y.o.   MRN: [id number]   Chief Complaint: ? seizure activity (Has been emotional and stuttering since episode)   HPI Patient has been brought in by her daughter stating she has been very shaky and agitated lately. Her speech has become very slurred. Family is not sure what is going on with her. They would like for her to see a neurologist.  Sunday before last, they noticed that her lip was jerking back and forth to the right side. for about 8-10 minutes and she was spacy. They called 911 and by the time they got there it resolved. This past Tuesday is when she started stuttering.   Review of Systems  Constitutional: Negative.  Negative for diaphoresis.  Eyes: Negative for pain.  Respiratory: Negative for shortness of breath.   Cardiovascular: Negative for chest pain, palpitations and leg swelling.  Gastrointestinal: Negative for abdominal pain.  Endocrine: Negative for polydipsia.  Skin: Negative for rash.  Neurological: Negative for dizziness, weakness and headaches.  Hematological: Does not bruise/bleed easily.  All other systems reviewed and are negative.      Objective:   Physical Exam Vitals and nursing note reviewed.  Constitutional:      Appearance: Normal appearance.  Cardiovascular:     Rate and Rhythm: Normal rate and regular rhythm.     Pulses: Normal pulses.     Heart sounds: Normal heart sounds.  Pulmonary:     Breath sounds: Normal breath sounds.  Skin:    General: Skin is warm.  Neurological:     General: No focal deficit present.     Mental Status: She is alert and oriented to person, place, and time.     Comments: Not able to follow simple commands Pupils equal bil Grips weak but equal. Right facial drooping. stutterig when speaking.  Psychiatric:        Mood and Affect: Mood normal.        Behavior: Behavior normal.    BP 140/78   Pulse (!) 47   Temp 98.1 F (36.7 C)  (Temporal)   Resp 20   Ht 4\' 10"  (1.473 m)   Wt 165 lb (74.8 kg)   LMP 11/05/1992   SpO2 100%   BMI 34.49 kg/m         Assessment & Plan:  [name] comes in today with chief complaint of ? seizure activity (Has been emotional and stuttering since episode)   Diagnosis and orders addressed:  1. Late effect of cerebrovascular accident (CVA) Fall prevention Referral to neurology - CT Head Wo Contrast; Future - Ambulatory referral to Neurology  2. Cerebral ischemia  - CT Head Wo Contrast; Future   Labs pending Health Maintenance reviewed Diet and exercise encouraged  Follow up plan: prn   Mary-Margaret Hassell Done, FNP

## 2020-04-30 ENCOUNTER — Telehealth: Payer: Self-pay | Admitting: Nurse Practitioner

## 2020-04-30 ENCOUNTER — Other Ambulatory Visit: Payer: Self-pay

## 2020-04-30 DIAGNOSIS — I6930 Unspecified sequelae of cerebral infarction: Secondary | ICD-10-CM

## 2020-04-30 DIAGNOSIS — I639 Cerebral infarction, unspecified: Secondary | ICD-10-CM

## 2020-05-02 ENCOUNTER — Ambulatory Visit
Admission: RE | Admit: 2020-05-02 | Discharge: 2020-05-02 | Disposition: A | Discharge status: Home / Self Care | Payer: Medicare Other | Source: Ambulatory Visit | Attending: Nurse Practitioner | Admitting: Nurse Practitioner

## 2020-05-02 DIAGNOSIS — I739 Peripheral vascular disease, unspecified: Secondary | ICD-10-CM | POA: Diagnosis not present

## 2020-05-02 DIAGNOSIS — I6930 Unspecified sequelae of cerebral infarction: Secondary | ICD-10-CM

## 2020-05-02 DIAGNOSIS — G9389 Other specified disorders of brain: Secondary | ICD-10-CM | POA: Diagnosis not present

## 2020-05-02 DIAGNOSIS — I639 Cerebral infarction, unspecified: Secondary | ICD-10-CM

## 2020-05-02 DIAGNOSIS — I69398 Other sequelae of cerebral infarction: Secondary | ICD-10-CM | POA: Diagnosis not present

## 2020-05-02 DIAGNOSIS — R29818 Other symptoms and signs involving the nervous system: Secondary | ICD-10-CM | POA: Diagnosis not present

## 2020-05-04 ENCOUNTER — Telehealth: Payer: Self-pay | Admitting: Nurse Practitioner

## 2020-05-04 NOTE — Telephone Encounter (Signed)
Pt aware of CT results. 

## 2020-05-11 ENCOUNTER — Other Ambulatory Visit: Payer: Self-pay | Admitting: Nurse Practitioner

## 2020-05-11 DIAGNOSIS — F5101 Primary insomnia: Secondary | ICD-10-CM

## 2020-05-11 DIAGNOSIS — I480 Paroxysmal atrial fibrillation: Secondary | ICD-10-CM

## 2020-05-14 ENCOUNTER — Encounter: Payer: Self-pay | Admitting: Nurse Practitioner

## 2020-05-14 ENCOUNTER — Ambulatory Visit (INDEPENDENT_AMBULATORY_CARE_PROVIDER_SITE_OTHER): Payer: Medicare Other | Admitting: Nurse Practitioner

## 2020-05-14 ENCOUNTER — Other Ambulatory Visit: Payer: Self-pay

## 2020-05-14 VITALS — HR 47 | Temp 97.7°F | Resp 20 | Ht <= 58 in | Wt 164.0 lb

## 2020-05-14 DIAGNOSIS — K219 Gastro-esophageal reflux disease without esophagitis: Secondary | ICD-10-CM

## 2020-05-14 DIAGNOSIS — F482 Pseudobulbar affect: Secondary | ICD-10-CM

## 2020-05-14 DIAGNOSIS — I10 Essential (primary) hypertension: Secondary | ICD-10-CM

## 2020-05-14 DIAGNOSIS — I69351 Hemiplegia and hemiparesis following cerebral infarction affecting right dominant side: Secondary | ICD-10-CM | POA: Diagnosis not present

## 2020-05-14 DIAGNOSIS — N1831 Chronic kidney disease, stage 3a: Secondary | ICD-10-CM | POA: Diagnosis not present

## 2020-05-14 DIAGNOSIS — Z6830 Body mass index (BMI) 30.0-30.9, adult: Secondary | ICD-10-CM

## 2020-05-14 DIAGNOSIS — F5101 Primary insomnia: Secondary | ICD-10-CM

## 2020-05-14 DIAGNOSIS — E785 Hyperlipidemia, unspecified: Secondary | ICD-10-CM

## 2020-05-14 DIAGNOSIS — L89311 Pressure ulcer of right buttock, stage 1: Secondary | ICD-10-CM | POA: Diagnosis not present

## 2020-05-14 DIAGNOSIS — I480 Paroxysmal atrial fibrillation: Secondary | ICD-10-CM | POA: Diagnosis not present

## 2020-05-14 DIAGNOSIS — I69391 Dysphagia following cerebral infarction: Secondary | ICD-10-CM | POA: Diagnosis not present

## 2020-05-14 DIAGNOSIS — I5032 Chronic diastolic (congestive) heart failure: Secondary | ICD-10-CM | POA: Diagnosis not present

## 2020-05-14 DIAGNOSIS — R609 Edema, unspecified: Secondary | ICD-10-CM

## 2020-05-14 DIAGNOSIS — M545 Low back pain, unspecified: Secondary | ICD-10-CM

## 2020-05-14 DIAGNOSIS — R600 Localized edema: Secondary | ICD-10-CM

## 2020-05-14 DIAGNOSIS — I6930 Unspecified sequelae of cerebral infarction: Secondary | ICD-10-CM

## 2020-05-14 DIAGNOSIS — F419 Anxiety disorder, unspecified: Secondary | ICD-10-CM

## 2020-05-14 MED ORDER — FUROSEMIDE 40 MG PO TABS: 40.0000 mg | ORAL_TABLET | Freq: Every day | ORAL | 1 refills | Status: DC

## 2020-05-14 MED ORDER — LOSARTAN POTASSIUM 100 MG PO TABS: 100.0000 mg | ORAL_TABLET | Freq: Every day | ORAL | 1 refills | Status: DC

## 2020-05-14 MED ORDER — DICLOFENAC SODIUM 1 % EX GEL: 4.0000 g | Freq: Four times a day (QID) | CUTANEOUS | 11 refills | Status: DC

## 2020-05-14 MED ORDER — ATORVASTATIN CALCIUM 40 MG PO TABS: 40.0000 mg | ORAL_TABLET | Freq: Every day | ORAL | 1 refills | Status: DC

## 2020-05-14 MED ORDER — HYDROXYZINE HCL 25 MG PO TABS: ORAL_TABLET | ORAL | 1 refills | Status: DC

## 2020-05-14 MED ORDER — TRAZODONE HCL 50 MG PO TABS: 50.0000 mg | ORAL_TABLET | Freq: Every day | ORAL | 1 refills | Status: DC

## 2020-05-14 MED ORDER — APIXABAN 5 MG PO TABS: 5.0000 mg | ORAL_TABLET | Freq: Two times a day (BID) | ORAL | 1 refills | Status: DC

## 2020-05-14 MED ORDER — CALMOSEPTINE 0.44-20.6 % EX OINT: TOPICAL_OINTMENT | CUTANEOUS | 11 refills | Status: DC

## 2020-05-14 NOTE — Progress Notes (Signed)
Subjective:    Patient ID: Brooke Chavez, female    DOB: [birth year], 79 y.o.   MRN: [id number]   Chief Complaint: Medical Management of Chronic Issues    HPI:  1. Essential hypertension NO C/O CHEST PAIN, SOB OR HEADACHE. Does not check bloood pressure at home. BP Readings from Last 3 Encounters:  04/24/20 140/78  02/22/20 (!) 145/82  01/05/20 122/66     2. Chronic diastolic congestive heart failure (HCC) Has occasional lower ext edema.  3. 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 Denies any recent falls  4. PAF (paroxysmal atrial fibrillation) (HCC) Denies palpitations or heart racing. Is on eliquis with no bleeding problems  5. Dysphagia, post-stroke Has some problems swallowing. Denies getting choked though.  6. Gastroesophageal reflux disease without esophagitis Patient has not been c/o any stomach issues  7. Stage 3a chronic kidney disease (HCC) No problems voiding Lab Results  Component Value Date   CREATININE 1.67 (H) 02/22/2020     8. Hyperlipidemia with target LDL less than 100 Eats whatever her family feeds her. They try to watch what they give her. Lab Results  Component Value Date   CHOL 162 11/08/2019   HDL 53 11/08/2019   LDLCALC 89 11/08/2019   TRIG 114 11/08/2019   CHOLHDL 3.1 11/08/2019     9. PBA (pseudobulbar affect) Is on not on any medication for this right nmow. family says she cries at times.  10. BMI 30.0-30.9,adult No recent weight changes BMI Readings from Last 3 Encounters:  05/14/20 34.28 kg/m  04/24/20 34.49 kg/m  02/22/20 35.32 kg/m   Wt Readings from Last 3 Encounters:  05/14/20 164 lb (74.4 kg)  04/24/20 165 lb (74.8 kg)  02/22/20 169 lb (76.7 kg)    11. insomnia Is on trazadone to sleep at night  Outpatient Encounter Medications as of 05/14/2020  Medication Sig  . acetaminophen (TYLENOL) 325 MG tablet Take 2 tablets  (650 mg total) by mouth every 6 (six) hours as needed for mild pain (or Fever >/= 101).  Marland Kitchen apixaban (ELIQUIS) 5 MG TABS tablet Take 1 tablet (5 mg total) by mouth 2 (two) times daily.  Marland Kitchen atorvastatin (LIPITOR) 40 MG tablet Take 1 tablet (40 mg total) by mouth daily at 6 PM.  . furosemide (LASIX) 40 MG tablet Take 1 tablet (40 mg total) by mouth daily.  . hydrOXYzine (ATARAX/VISTARIL) 25 MG tablet 1/2 -1 po BID prn anxiety (Patient not taking: Reported on 04/24/2020)  . losartan (COZAAR) 100 MG tablet Take 1 tablet (100 mg total) by mouth daily.  . traZODone (DESYREL) 50 MG tablet TAKE 1 TABLET BY MOUTH AT BEDTIME     Past Surgical History:  Procedure Laterality Date  . A FLUTTER ABLATION N/A 06/27/2011   Procedure: ABLATION A FLUTTER;  Surgeon: Thompson Grayer, MD;  Location: [facility] CATH LAB;  Service: Cardiovascular;  Laterality: N/A;  . ATRIAL ABLATION SURGERY  06/2010  . HIP SURGERY     Left (fracture) 3/13  . JOINT REPLACEMENT     both knees   . KNEE ARTHROSCOPY     Right  . LUMBAR SPINE SURGERY    . MITRAL VALVE REPAIR Right 01/07/2018   Procedure: MINIMALLY INVASIVE MITRAL VALVE REPAIR using LivaNova ring size 28 MM;  Surgeon: Rexene Alberts, MD;  Location: [facility];  Service: Open Heart Surgery;  Laterality: Right;  . PATENT FORAMEN OVALE(PFO) CLOSURE N/A  01/07/2018   Procedure: PATENT FORAMEN OVALE (PFO) CLOSURE;  Surgeon: Rexene Alberts, MD;  Location: [place];  Service: Open Heart Surgery;  Laterality: N/A;  . RIGHT/LEFT HEART CATH AND CORONARY ANGIOGRAPHY N/A 11/11/2017   Procedure: RIGHT/LEFT HEART CATH AND CORONARY ANGIOGRAPHY;  Surgeon: Sherren Mocha, MD;  Location: [place] CV LAB;  Service: Cardiovascular;  Laterality: N/A;  . TEE WITHOUT CARDIOVERSION N/A 09/18/2017   Procedure: TRANSESOPHAGEAL ECHOCARDIOGRAM (TEE);  Surgeon: Skeet Latch, MD;  Location: [place];  Service: Cardiovascular;  Laterality: N/A;  . TEE WITHOUT CARDIOVERSION N/A 01/07/2018   Procedure:  TRANSESOPHAGEAL ECHOCARDIOGRAM (TEE);  Surgeon: Rexene Alberts, MD;  Location: [facility];  Service: Open Heart Surgery;  Laterality: N/A;  . TOTAL KNEE ARTHROPLASTY     Left  . TUBAL LIGATION      Family History  Problem Relation Age of Onset  . Hypertension Mother   . Cancer Sister        leukemia  . Diabetes Brother   . Stroke Sister   . Diabetes Brother   . Heart attack Brother   . Healthy Daughter   . Healthy Son   . Healthy Daughter     New complaints: -Patient was seen on 04/24/20. Her family was stating that she has been very shaky and more anxious then usual. They had to call 911 several times at home but they did not take her to the hospital. We order ed a head CT at the request of family and it was negative for any changes. We gave her some atarax which has helped. - c/o back pain the last couple of days. - sore spot on her back side wants looked at.   Social history: Lives with her daughters  Controlled substance contract: n/a    Review of Systems  Constitutional: Negative for diaphoresis.  Eyes: Negative for pain.  Respiratory: Negative for shortness of breath.   Cardiovascular: Negative for chest pain, palpitations and leg swelling.  Gastrointestinal: Negative for abdominal pain.  Endocrine: Negative for polydipsia.  Musculoskeletal: Positive for back pain.  Skin: Negative for rash.  Neurological: Negative for dizziness, weakness and headaches.  Hematological: Does not bruise/bleed easily.  All other systems reviewed and are negative.      Objective:   Physical Exam Vitals and nursing note reviewed.  Constitutional:      General: She is not in acute distress.    Appearance: Normal appearance. She is well-developed.  HENT:     Head: Normocephalic.     Nose: Nose normal.  Eyes:     Pupils: Pupils are equal, round, and reactive to light.  Neck:     Vascular: No carotid bruit or JVD.  Cardiovascular:     Rate and Rhythm: Normal rate and regular  rhythm.     Heart sounds: Normal heart sounds.  Pulmonary:     Effort: Pulmonary effort is normal. No respiratory distress.     Breath sounds: Normal breath sounds. No wheezing or rales.  Chest:     Chest wall: No tenderness.  Abdominal:     General: Bowel sounds are normal. There is no distension or abdominal bruit.     Palpations: Abdomen is soft. There is no hepatomegaly, splenomegaly, mass or pulsatile mass.     Tenderness: There is no abdominal tenderness.  Musculoskeletal:        General: Normal range of motion.     Cervical back: Normal range of motion and neck supple.  Lymphadenopathy:  Cervical: No cervical adenopathy.  Skin:    General: Skin is warm and dry.     Comments: 2cm annular superficial open area on right buttocks.  Neurological:     Mental Status: She is alert and oriented to person, place, and time.     Deep Tendon Reflexes: Reflexes are normal and symmetric.  Psychiatric:        Behavior: Behavior normal.        Thought Content: Thought content normal.        Judgment: Judgment normal.    Pulse (!) 47   Temp 97.7 F (36.5 C) (Temporal)   Resp 20   Ht 4' 10" (1.473 m)   Wt 164 lb (74.4 kg)   LMP 11/05/1992   SpO2 96%   BMI 34.28 kg/m          Assessment & Plan:  Daun AVIN UPPERMAN comes in today with chief complaint of Medical Management of Chronic Issues   Diagnosis and orders addressed:  1. Essential hypertension Low soidum diet - CBC with Differential/Platelet - CMP14+EGFR - losartan (COZAAR) 100 MG tablet; Take 1 tablet (100 mg total) by mouth daily.  Dispense: 90 tablet; Refill: 1  2. Chronic diastolic congestive heart failure ([place]) Report any sob  3. 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 PAF (paroxysmal atrial fibrillation) (HCC) - apixaban (ELIQUIS) 5 MG TABS tablet; Take 1 tablet (5 mg total) by mouth 2 (two) times daily.  Dispense: 180 tablet; Refill: 1  5.  Dysphagia, post-stroke Chew food well  6. Gastroesophageal reflux disease without esophagitis Avoid spicy foods Do not eat 2 hours prior to bedtime  7. Stage 3a chronic kidney disease ([place]) Labs pending  8. Hyperlipidemia with target LDL less than 100 Low fat diet - Lipid panel - atorvastatin (LIPITOR) 40 MG tablet; Take 1 tablet (40 mg total) by mouth daily at 6 PM.  Dispense: 90 tablet; Refill: 1  9. PBA (pseudobulbar affect)  10. BMI 30.0-30.9,adult Discussed diet and exercise for person with BMI >25 Will recheck weight in 3-6 months  11. Pressure injury of right buttock, stage 1 Get donut fro her to seat on Needs  To get up and move around every 2 hours during the day  12. Acute midline low back pain without sciatica Massage back daily Con use salponia patched OTC daily if can figure out exactly where back is hurting to put in proper place.  Cannot do motrin or NSAIDS due to kidney function. - diclofenac Sodium (VOLTAREN) 1 % GEL; Apply 4 g topically 4 (four) times daily.  Dispense: 350 g; Refill: 11  13. Anxiety Stress management - hydrOXYzine (ATARAX/VISTARIL) 25 MG tablet; 1/2 -1 po BID prn anxiety  Dispense: 90 tablet; Refill: 1  14. Peripheral edema Elevate legs when swelling occurs. - furosemide (LASIX) 40 MG tablet; Take 1 tablet (40 mg total) by mouth daily.  Dispense: 90 tablet; Refill: 1  15. Primary insomnia Bedtime routine. - traZODone (DESYREL) 50 MG tablet; Take 1 tablet (50 mg total) by mouth at bedtime.  Dispense: 90 tablet; Refill: 1    Labs pending Health Maintenance reviewed Diet and exercise encouraged  Follow up plan: *6 MONTHS**   Mary-Margaret Hassell Done, FNP

## 2020-05-15 DIAGNOSIS — M6281 Muscle weakness (generalized): Secondary | ICD-10-CM | POA: Diagnosis not present

## 2020-05-15 DIAGNOSIS — R279 Unspecified lack of coordination: Secondary | ICD-10-CM | POA: Diagnosis not present

## 2020-05-15 DIAGNOSIS — Z9181 History of falling: Secondary | ICD-10-CM | POA: Diagnosis not present

## 2020-05-15 DIAGNOSIS — I10 Essential (primary) hypertension: Secondary | ICD-10-CM | POA: Diagnosis not present

## 2020-05-15 LAB — CMP14+EGFR
ALT: 17 IU/L (ref 0–32)
AST: 25 IU/L (ref 0–40)
Albumin/Globulin Ratio: 1.4 (ref 1.2–2.2)
Albumin: 4.3 g/dL (ref 3.7–4.7)
Alkaline Phosphatase: 98 IU/L (ref 44–121)
BUN/Creatinine Ratio: 14 (ref 12–28)
BUN: 27 mg/dL (ref 8–27)
Bilirubin Total: 0.5 mg/dL (ref 0.0–1.2)
CO2: 19 mmol/L — ABNORMAL LOW (ref 20–29)
Calcium: 9.8 mg/dL (ref 8.7–10.3)
Chloride: 104 mmol/L (ref 96–106)
Creatinine, Ser: 1.93 mg/dL — ABNORMAL HIGH (ref 0.57–1.00)
GFR calc Af Amer: 28 mL/min/{1.73_m2} — ABNORMAL LOW (ref >59)
GFR calc non Af Amer: 24 mL/min/{1.73_m2} — ABNORMAL LOW (ref >59)
Globulin, Total: 3.1 g/dL (ref 1.5–4.5)
Glucose: 83 mg/dL (ref 65–99)
Potassium: 4.5 mmol/L (ref 3.5–5.2)
Sodium: 142 mmol/L (ref 134–144)
Total Protein: 7.4 g/dL (ref 6.0–8.5)

## 2020-05-15 LAB — CBC WITH DIFFERENTIAL/PLATELET
Basophils Absolute: 0 10*3/uL (ref 0.0–0.2)
Basos: 0 %
EOS (ABSOLUTE): 0 10*3/uL (ref 0.0–0.4)
Eos: 1 %
Hematocrit: 35.1 % (ref 34.0–46.6)
Hemoglobin: 11.3 g/dL (ref 11.1–15.9)
Immature Grans (Abs): 0 10*3/uL (ref 0.0–0.1)
Immature Granulocytes: 0 %
Lymphocytes Absolute: 2.9 10*3/uL (ref 0.7–3.1)
Lymphs: 45 %
MCH: 31.7 pg (ref 26.6–33.0)
MCHC: 32.2 g/dL (ref 31.5–35.7)
MCV: 98 fL — ABNORMAL HIGH (ref 79–97)
Monocytes Absolute: 0.3 10*3/uL (ref 0.1–0.9)
Monocytes: 5 %
Neutrophils Absolute: 3 10*3/uL (ref 1.4–7.0)
Neutrophils: 49 %
Platelets: 229 10*3/uL (ref 150–450)
RBC: 3.57 x10E6/uL — ABNORMAL LOW (ref 3.77–5.28)
RDW: 13.3 % (ref 11.7–15.4)
WBC: 6.3 10*3/uL (ref 3.4–10.8)

## 2020-05-15 LAB — LIPID PANEL
Chol/HDL Ratio: 3.3 ratio (ref 0.0–4.4)
Cholesterol, Total: 173 mg/dL (ref 100–199)
HDL: 52 mg/dL (ref >39)
LDL Chol Calc (NIH): 102 mg/dL — ABNORMAL HIGH (ref 0–99)
Triglycerides: 103 mg/dL (ref 0–149)
VLDL Cholesterol Cal: 19 mg/dL (ref 5–40)

## 2020-05-17 DIAGNOSIS — Z9181 History of falling: Secondary | ICD-10-CM | POA: Diagnosis not present

## 2020-05-17 DIAGNOSIS — R279 Unspecified lack of coordination: Secondary | ICD-10-CM | POA: Diagnosis not present

## 2020-05-17 DIAGNOSIS — M6281 Muscle weakness (generalized): Secondary | ICD-10-CM | POA: Diagnosis not present

## 2020-05-17 DIAGNOSIS — I10 Essential (primary) hypertension: Secondary | ICD-10-CM | POA: Diagnosis not present

## 2020-05-24 DIAGNOSIS — M6281 Muscle weakness (generalized): Secondary | ICD-10-CM | POA: Diagnosis not present

## 2020-05-24 DIAGNOSIS — I10 Essential (primary) hypertension: Secondary | ICD-10-CM | POA: Diagnosis not present

## 2020-05-24 DIAGNOSIS — R279 Unspecified lack of coordination: Secondary | ICD-10-CM | POA: Diagnosis not present

## 2020-05-24 DIAGNOSIS — Z9181 History of falling: Secondary | ICD-10-CM | POA: Diagnosis not present

## 2020-05-28 ENCOUNTER — Encounter: Payer: Self-pay | Admitting: Nurse Practitioner

## 2020-05-28 ENCOUNTER — Ambulatory Visit (INDEPENDENT_AMBULATORY_CARE_PROVIDER_SITE_OTHER): Payer: Medicare Other | Admitting: Nurse Practitioner

## 2020-05-28 ENCOUNTER — Other Ambulatory Visit: Payer: Self-pay

## 2020-05-28 VITALS — BP 143/71 | HR 50 | Temp 98.1°F | Resp 20 | Ht <= 58 in | Wt 165.0 lb

## 2020-05-28 DIAGNOSIS — L89311 Pressure ulcer of right buttock, stage 1: Secondary | ICD-10-CM

## 2020-05-28 NOTE — Patient Instructions (Signed)
Pressure Injury  A pressure injury is damage to the skin and underlying tissue that results from pressure being applied to an area of the body. It often affects people who must spend a long time in a bed or chair because of a medical condition. Pressure injuries usually occur:  Over bony parts of the body, such as the tailbone, shoulders, elbows, hips, heels, spine, ankles, and back of the head.  Under medical devices that make contact with the body, such as respiratory equipment, stockings, tubes, and splints. Pressure injuries start as reddened areas on the skin and can lead to pain and an open wound. What are the causes? This condition is caused by frequent or constant pressure to an area of the body. Decreased blood flow to the skin can eventually cause the skin tissue to die and break down, causing a wound. What increases the risk? You are more likely to develop this condition if you:  Are in the hospital or an extended care facility.  Are bedridden or in a wheelchair.  Have an injury or disease that keeps you from: ? Moving normally. ? Feeling pain or pressure.  Have a condition that: ? Makes you sleepy or less alert. ? Causes poor blood flow.  Need to wear a medical device.  Have poor control of your bladder or bowel functions (incontinence).  Have poor nutrition (malnutrition). If you are at risk for pressure injuries, your health care provider may recommend certain types of mattresses, mattress covers, pillows, cushions, or boots to help prevent them. These may include products filled with air, foam, gel, or sand. What are the signs or symptoms? Symptoms of this condition depend on the severity of the injury. Symptoms may include:  Red or dark areas of the skin.  Pain, warmth, or a change of skin texture.  Blisters.  An open wound. How is this diagnosed? This condition is diagnosed with a medical history and physical exam. You may also have tests, such as:  Blood  tests.  Imaging tests.  Blood flow tests. Your pressure injury will be staged based on its severity. Staging is based on:  The depth of the tissue injury, including whether there is exposure of muscle, bone, or tendon.  The cause of the pressure injury. How is this treated? This condition may be treated by:  Relieving or redistributing pressure on your skin. This includes: ? Frequently changing your position. ? Avoiding positions that caused the wound or that can make the wound worse. ? Using specific bed mattresses, chair cushions, or protective boots. ? Moving medical devices from an area of pressure, or placing padding between the skin and the device. ? Using foams, creams, or powders to prevent rubbing (friction) on the skin.  Keeping your skin clean and dry. This may include using a skin cleanser or skin barrier as told by your health care provider.  Cleaning your injury and removing any dead tissue from the wound (debridement).  Placing a bandage (dressing) over your injury.  Using medicines for pain or to prevent or treat infection. Surgery may be needed if other treatments are not working or if your injury is very deep. Follow these instructions at home: Wound care  Follow instructions from your health care provider about how to take care of your wound. Make sure you: ? Wash your hands with soap and water before and after you change your bandage (dressing). If soap and water are not available, use hand sanitizer. ? Change your dressing as told   by your health care provider.  Check your wound every day for signs of infection. Have a caregiver do this for you if you are not able. Check for: ? Redness, swelling, or increased pain. ? More fluid or blood. ? Warmth. ? Pus or a bad smell. Skin care  Keep your skin clean and dry. Gently pat your skin dry.  Do not rub or massage your skin.  You or a caregiver should check your skin every day for any changes in color or  any new blisters or sores (ulcers). Medicines  Take over-the-counter and prescription medicines only as told by your health care provider.  If you were prescribed an antibiotic medicine, take or apply it as told by your health care provider. Do not stop using the antibiotic even if your condition improves. Reducing and redistributing pressure  Do not lie or sit in one position for a long time. Move or change position every 1-2 hours, or as told by your health care provider.  Use pillows or cushions to reduce pressure. Ask your health care provider to recommend cushions or pads for you. General instructions   Eat a healthy diet that includes lots of protein.  Drink enough fluid to keep your urine pale yellow.  Be as active as you can every day. Ask your health care provider to suggest safe exercises or activities.  Do not abuse drugs or alcohol.  Do not use any products that contain nicotine or tobacco, such as cigarettes, e-cigarettes, and chewing tobacco. If you need help quitting, ask your health care provider.  Keep all follow-up visits as told by your health care provider. This is important. Contact a health care provider if:  You have: ? A fever or chills. ? Pain that is not helped by medicine. ? Any changes in skin color. ? New blisters or sores. ? Pus or a bad smell coming from your wound. ? Redness, swelling, or pain around your wound. ? More fluid or blood coming from your wound.  Your wound does not improve after 1-2 weeks of treatment. Summary  A pressure injury is damage to the skin and underlying tissue that results from pressure being applied to an area of the body.  Do not lie or sit in one position for a long time. Your health care provider may advise you to move or change position every 1-2 hours.  Follow instructions from your health care provider about how to take care of your wound.  Keep all follow-up visits as told by your health care provider. This  is important. This information is not intended to replace advice given to you by your health care provider. Make sure you discuss any questions you have with your health care provider. Document Revised: 02/24/2018 Document Reviewed: 02/24/2018 Elsevier Patient Education  2020 [employer]  

## 2020-05-28 NOTE — Progress Notes (Signed)
   Subjective:    Patient ID: Brooke Chavez, female    DOB: [birth year], 79 y.o.   MRN: [id number]   Chief Complaint: Sore on bottom   HPI Patient was seen in office for chronic follow up on 05/14/20. She had a small pressure lesion on her buttocks. They were told to keep clean and dry and get a donut for her to seat on. They come back today stating lesion is no better. Has had no drainage. She was told to use calmoseptine and daughter th ought that as for her low back pain and she had been putting voltaren gel on her sore.   Review of Systems  Skin: Positive for wound (buttocks).       Objective:   Physical Exam Vitals and nursing note reviewed.  Constitutional:      Appearance: Normal appearance.  Skin:    Comments: 2cm annular superficial wound on bil buttocks  Neurological:     Mental Status: She is alert.    BP (!) 143/71   Pulse (!) 50   Temp 98.1 F (36.7 C) (Temporal)   Resp 20   Ht 4\' 10"  (1.473 m)   Wt 165 lb (74.8 kg)   LMP 11/05/1992   SpO2 94%   BMI 34.49 kg/m         Assessment & Plan:  Brooke Chavez in today with chief complaint of Sore on bottom   1. Pressure injury of right buttock, stage 1 Does not appear infected Use calmospetine BID  Encourage to get up and walk around every few hours in the house Encourage to seat on donut Call if not improving  The above assessment and management plan was discussed with the patient. The patient verbalized understanding of and has agreed to the management plan. Patient is aware to call the clinic if symptoms persist or worsen. Patient is aware when to return to the clinic for a follow-up visit. Patient educated on when it is appropriate to go to the emergency department.   Mary-Margaret Hassell Done, FNP

## 2020-05-29 DIAGNOSIS — Z9181 History of falling: Secondary | ICD-10-CM | POA: Diagnosis not present

## 2020-05-29 DIAGNOSIS — I10 Essential (primary) hypertension: Secondary | ICD-10-CM | POA: Diagnosis not present

## 2020-05-29 DIAGNOSIS — R279 Unspecified lack of coordination: Secondary | ICD-10-CM | POA: Diagnosis not present

## 2020-05-29 DIAGNOSIS — M6281 Muscle weakness (generalized): Secondary | ICD-10-CM | POA: Diagnosis not present

## 2020-06-04 ENCOUNTER — Other Ambulatory Visit: Payer: Self-pay

## 2020-06-04 ENCOUNTER — Ambulatory Visit (INDEPENDENT_AMBULATORY_CARE_PROVIDER_SITE_OTHER): Payer: Medicare Other

## 2020-06-12 DIAGNOSIS — R279 Unspecified lack of coordination: Secondary | ICD-10-CM | POA: Diagnosis not present

## 2020-06-12 DIAGNOSIS — I10 Essential (primary) hypertension: Secondary | ICD-10-CM | POA: Diagnosis not present

## 2020-06-12 DIAGNOSIS — M6281 Muscle weakness (generalized): Secondary | ICD-10-CM | POA: Diagnosis not present

## 2020-06-12 DIAGNOSIS — Z9181 History of falling: Secondary | ICD-10-CM | POA: Diagnosis not present

## 2020-06-14 ENCOUNTER — Other Ambulatory Visit: Payer: Self-pay | Admitting: Nurse Practitioner

## 2020-06-14 DIAGNOSIS — M6281 Muscle weakness (generalized): Secondary | ICD-10-CM | POA: Diagnosis not present

## 2020-06-14 DIAGNOSIS — R609 Edema, unspecified: Secondary | ICD-10-CM

## 2020-06-14 DIAGNOSIS — R279 Unspecified lack of coordination: Secondary | ICD-10-CM | POA: Diagnosis not present

## 2020-06-14 DIAGNOSIS — I10 Essential (primary) hypertension: Secondary | ICD-10-CM | POA: Diagnosis not present

## 2020-06-14 DIAGNOSIS — Z9181 History of falling: Secondary | ICD-10-CM | POA: Diagnosis not present

## 2020-06-18 ENCOUNTER — Telehealth: Payer: Self-pay | Admitting: Nurse Practitioner

## 2020-06-18 NOTE — Telephone Encounter (Signed)
Do OTC dulcolax- 1 po

## 2020-06-18 NOTE — Telephone Encounter (Signed)
Daughter aware.

## 2020-06-19 DIAGNOSIS — I10 Essential (primary) hypertension: Secondary | ICD-10-CM | POA: Diagnosis not present

## 2020-06-19 DIAGNOSIS — Z9181 History of falling: Secondary | ICD-10-CM | POA: Diagnosis not present

## 2020-06-19 DIAGNOSIS — M6281 Muscle weakness (generalized): Secondary | ICD-10-CM | POA: Diagnosis not present

## 2020-06-19 DIAGNOSIS — R279 Unspecified lack of coordination: Secondary | ICD-10-CM | POA: Diagnosis not present

## 2020-06-21 DIAGNOSIS — R279 Unspecified lack of coordination: Secondary | ICD-10-CM | POA: Diagnosis not present

## 2020-06-21 DIAGNOSIS — Z9181 History of falling: Secondary | ICD-10-CM | POA: Diagnosis not present

## 2020-06-21 DIAGNOSIS — M6281 Muscle weakness (generalized): Secondary | ICD-10-CM | POA: Diagnosis not present

## 2020-06-21 DIAGNOSIS — I10 Essential (primary) hypertension: Secondary | ICD-10-CM | POA: Diagnosis not present

## 2020-06-25 ENCOUNTER — Inpatient Hospital Stay: Payer: Medicare Other

## 2020-06-25 ENCOUNTER — Inpatient Hospital Stay: Payer: Medicare Other | Admitting: Oncology

## 2020-06-26 DIAGNOSIS — I10 Essential (primary) hypertension: Secondary | ICD-10-CM | POA: Diagnosis not present

## 2020-06-26 DIAGNOSIS — M6281 Muscle weakness (generalized): Secondary | ICD-10-CM | POA: Diagnosis not present

## 2020-06-26 DIAGNOSIS — Z9181 History of falling: Secondary | ICD-10-CM | POA: Diagnosis not present

## 2020-06-26 DIAGNOSIS — R279 Unspecified lack of coordination: Secondary | ICD-10-CM | POA: Diagnosis not present

## 2020-06-27 ENCOUNTER — Institutional Professional Consult (permissible substitution): Payer: Medicare Other | Admitting: Neurology

## 2020-06-28 ENCOUNTER — Other Ambulatory Visit: Payer: Self-pay

## 2020-06-28 ENCOUNTER — Ambulatory Visit (HOSPITAL_COMMUNITY)
Admission: RE | Admit: 2020-06-28 | Discharge: 2020-06-28 | Disposition: A | Discharge status: Home / Self Care | Payer: Medicare Other | Source: Ambulatory Visit | Attending: Oncology | Admitting: Oncology

## 2020-06-28 DIAGNOSIS — K8689 Other specified diseases of pancreas: Secondary | ICD-10-CM | POA: Diagnosis not present

## 2020-06-28 DIAGNOSIS — R279 Unspecified lack of coordination: Secondary | ICD-10-CM | POA: Diagnosis not present

## 2020-06-28 DIAGNOSIS — Z9181 History of falling: Secondary | ICD-10-CM | POA: Diagnosis not present

## 2020-06-28 DIAGNOSIS — K869 Disease of pancreas, unspecified: Secondary | ICD-10-CM | POA: Diagnosis not present

## 2020-06-28 DIAGNOSIS — I10 Essential (primary) hypertension: Secondary | ICD-10-CM | POA: Diagnosis not present

## 2020-06-28 DIAGNOSIS — M6281 Muscle weakness (generalized): Secondary | ICD-10-CM | POA: Diagnosis not present

## 2020-06-28 DIAGNOSIS — K7689 Other specified diseases of liver: Secondary | ICD-10-CM | POA: Diagnosis not present

## 2020-06-28 DIAGNOSIS — Q630 Accessory kidney: Secondary | ICD-10-CM | POA: Diagnosis not present

## 2020-06-28 DIAGNOSIS — N281 Cyst of kidney, acquired: Secondary | ICD-10-CM | POA: Diagnosis not present

## 2020-06-28 MED ORDER — GADOBUTROL 1 MMOL/ML IV SOLN
7.0000 mL | Freq: Once | INTRAVENOUS | Status: AC | PRN
  Administered 2020-06-28: 7 mL via INTRAVENOUS

## 2020-07-04 ENCOUNTER — Telehealth: Payer: Self-pay | Admitting: *Deleted

## 2020-07-04 NOTE — Telephone Encounter (Signed)
-----   Message from Ladell Pier, MD sent at 07/04/2020  3:17 PM EST ----- MRI with no evidence of cancer, was supposed to have follow-up visit here after the MRI, please schedule follow-up with Benay Spice or Lattie Haw next 1 month

## 2020-07-04 NOTE — Telephone Encounter (Signed)
Notified daughter of MRI results. Scheduler will be calling for OV in ~ 1 month.

## 2020-07-06 DIAGNOSIS — Z23 Encounter for immunization: Secondary | ICD-10-CM | POA: Diagnosis not present

## 2020-07-09 ENCOUNTER — Telehealth: Payer: Self-pay | Admitting: Oncology

## 2020-07-09 DIAGNOSIS — M6281 Muscle weakness (generalized): Secondary | ICD-10-CM | POA: Diagnosis not present

## 2020-07-09 DIAGNOSIS — R279 Unspecified lack of coordination: Secondary | ICD-10-CM | POA: Diagnosis not present

## 2020-07-09 DIAGNOSIS — I10 Essential (primary) hypertension: Secondary | ICD-10-CM | POA: Diagnosis not present

## 2020-07-09 DIAGNOSIS — Z9181 History of falling: Secondary | ICD-10-CM | POA: Diagnosis not present

## 2020-07-09 NOTE — Telephone Encounter (Signed)
Scheduled appt per 12/4 sch msg - left message for pt to call back if appt does not work . Left message with appt date and time

## 2020-07-09 NOTE — Telephone Encounter (Signed)
Rescheduled appointment per patient's daughter, Jolayne Panther request. Spoke to daughter who is aware of updated appointment date and time.

## 2020-07-12 DIAGNOSIS — M6281 Muscle weakness (generalized): Secondary | ICD-10-CM | POA: Diagnosis not present

## 2020-07-12 DIAGNOSIS — Z9181 History of falling: Secondary | ICD-10-CM | POA: Diagnosis not present

## 2020-07-12 DIAGNOSIS — R279 Unspecified lack of coordination: Secondary | ICD-10-CM | POA: Diagnosis not present

## 2020-07-12 DIAGNOSIS — I10 Essential (primary) hypertension: Secondary | ICD-10-CM | POA: Diagnosis not present

## 2020-07-17 DIAGNOSIS — R279 Unspecified lack of coordination: Secondary | ICD-10-CM | POA: Diagnosis not present

## 2020-07-17 DIAGNOSIS — M6281 Muscle weakness (generalized): Secondary | ICD-10-CM | POA: Diagnosis not present

## 2020-07-17 DIAGNOSIS — I10 Essential (primary) hypertension: Secondary | ICD-10-CM | POA: Diagnosis not present

## 2020-07-17 DIAGNOSIS — Z9181 History of falling: Secondary | ICD-10-CM | POA: Diagnosis not present

## 2020-07-19 DIAGNOSIS — M6281 Muscle weakness (generalized): Secondary | ICD-10-CM | POA: Diagnosis not present

## 2020-07-19 DIAGNOSIS — R279 Unspecified lack of coordination: Secondary | ICD-10-CM | POA: Diagnosis not present

## 2020-07-19 DIAGNOSIS — I10 Essential (primary) hypertension: Secondary | ICD-10-CM | POA: Diagnosis not present

## 2020-07-19 DIAGNOSIS — R269 Unspecified abnormalities of gait and mobility: Secondary | ICD-10-CM | POA: Diagnosis not present

## 2020-07-19 DIAGNOSIS — I639 Cerebral infarction, unspecified: Secondary | ICD-10-CM | POA: Diagnosis not present

## 2020-07-19 DIAGNOSIS — Z9181 History of falling: Secondary | ICD-10-CM | POA: Diagnosis not present

## 2020-07-24 DIAGNOSIS — Z9181 History of falling: Secondary | ICD-10-CM | POA: Diagnosis not present

## 2020-07-24 DIAGNOSIS — M6281 Muscle weakness (generalized): Secondary | ICD-10-CM | POA: Diagnosis not present

## 2020-07-24 DIAGNOSIS — R279 Unspecified lack of coordination: Secondary | ICD-10-CM | POA: Diagnosis not present

## 2020-07-24 DIAGNOSIS — I639 Cerebral infarction, unspecified: Secondary | ICD-10-CM | POA: Diagnosis not present

## 2020-07-24 DIAGNOSIS — I10 Essential (primary) hypertension: Secondary | ICD-10-CM | POA: Diagnosis not present

## 2020-07-24 DIAGNOSIS — R269 Unspecified abnormalities of gait and mobility: Secondary | ICD-10-CM | POA: Diagnosis not present

## 2020-07-26 DIAGNOSIS — M6281 Muscle weakness (generalized): Secondary | ICD-10-CM | POA: Diagnosis not present

## 2020-07-26 DIAGNOSIS — R269 Unspecified abnormalities of gait and mobility: Secondary | ICD-10-CM | POA: Diagnosis not present

## 2020-07-26 DIAGNOSIS — Z9181 History of falling: Secondary | ICD-10-CM | POA: Diagnosis not present

## 2020-07-26 DIAGNOSIS — I639 Cerebral infarction, unspecified: Secondary | ICD-10-CM | POA: Diagnosis not present

## 2020-07-31 DIAGNOSIS — Z9181 History of falling: Secondary | ICD-10-CM | POA: Diagnosis not present

## 2020-07-31 DIAGNOSIS — I10 Essential (primary) hypertension: Secondary | ICD-10-CM | POA: Diagnosis not present

## 2020-07-31 DIAGNOSIS — M6281 Muscle weakness (generalized): Secondary | ICD-10-CM | POA: Diagnosis not present

## 2020-07-31 DIAGNOSIS — R279 Unspecified lack of coordination: Secondary | ICD-10-CM | POA: Diagnosis not present

## 2020-08-02 DIAGNOSIS — M6281 Muscle weakness (generalized): Secondary | ICD-10-CM | POA: Diagnosis not present

## 2020-08-02 DIAGNOSIS — I10 Essential (primary) hypertension: Secondary | ICD-10-CM | POA: Diagnosis not present

## 2020-08-02 DIAGNOSIS — Z9181 History of falling: Secondary | ICD-10-CM | POA: Diagnosis not present

## 2020-08-02 DIAGNOSIS — R279 Unspecified lack of coordination: Secondary | ICD-10-CM | POA: Diagnosis not present

## 2020-08-06 ENCOUNTER — Other Ambulatory Visit: Payer: Self-pay

## 2020-08-06 ENCOUNTER — Ambulatory Visit (INDEPENDENT_AMBULATORY_CARE_PROVIDER_SITE_OTHER): Payer: Medicare Other | Admitting: Neurology

## 2020-08-06 ENCOUNTER — Encounter: Payer: Self-pay | Admitting: Neurology

## 2020-08-06 VITALS — BP 163/61 | HR 57

## 2020-08-06 DIAGNOSIS — I63512 Cerebral infarction due to unspecified occlusion or stenosis of left middle cerebral artery: Secondary | ICD-10-CM | POA: Diagnosis not present

## 2020-08-06 DIAGNOSIS — R4701 Aphasia: Secondary | ICD-10-CM | POA: Diagnosis not present

## 2020-08-06 DIAGNOSIS — I6990 Unspecified sequelae of unspecified cerebrovascular disease: Secondary | ICD-10-CM

## 2020-08-06 DIAGNOSIS — G40009 Localization-related (focal) (partial) idiopathic epilepsy and epileptic syndromes with seizures of localized onset, not intractable, without status epilepticus: Secondary | ICD-10-CM | POA: Diagnosis not present

## 2020-08-06 DIAGNOSIS — G40909 Epilepsy, unspecified, not intractable, without status epilepticus: Secondary | ICD-10-CM

## 2020-08-06 NOTE — Progress Notes (Signed)
[name] [address] [place]. 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  Seen today for new office consultation visit for possible seizure.  History is obtained from the patient and Brooke Chavez as well as review of electronic medical records and I personally reviewed available pertinent imaging films in PACS.  Patient apparently had 2 brief episodes of altered awareness 1 in September and 1 in October.  The Chavez was eyewitness noticed that she had some twitchings in the right side of the face and she was staring unresponsive and did not respond to questions.  This lasted only 5-61minutes and she returned to Brooke baseline.  She did not have any tonic-clonic activity, incontinence, increased weakness or speech difficulty.  She mentioned this to Brooke primary care physician who thought that she had just used a bathroom disc was possibly a convulsive syncope however the patient did not pass out or lose consciousness or fall.  Second episode occurred a month later but this was much more brief and not as intense.  No obvious trigger to both episodes.  Patient has no known history of seizures, loss of consciousness, tonic-clonic activity.  She has no prior history of significant head injury with loss of consciousness.  Is no family history of epilepsy or seizures.  Patient was seen by me in the office last on 01/11/2019 and was doing well  on Eliquis.  She has had no other recurrent stroke or TIA symptoms.  Blood pressure and cholesterol has apparently been under good control though the Chavez cannot tell me the last time she had lab work done or screening carotid Dopplers done.  ROS:   14 system review of systems is positive for aphasia, speech difficulty, trouble understanding, altered awareness, facial twitching's and all other systems negative  PMH:  Past Medical History:  Diagnosis Date  . Atrial flutter ([place])    Typical by EKG diagnosis 9/11 s/p CIT ablation 11/11  . Bradycardia   . Chronic diastolic congestive heart failure ([place])   . 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 DJD (degenerative joint disease)   . Dysrhythmia   . Femur fracture, left ([place]) 2013  . Global aphasia   . Hyperlipidemia    x5 years  . Hypertension    Since 1997  . 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 Liver masses 11/13/2017   Multiple small nodules seen on CT and MRI  . Mitral regurgitation    severe  . 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 Stroke ([place])   . TR (tricuspid regurgitation)    Mild with RA enlargment    Social History:  Social History   Socioeconomic History  .  Marital status: Widowed    Spouse name: Not on file  . Number of children: 3  . Years of education: 66  . Highest education level: Not on file  Occupational History    Comment: na  Tobacco Use  . Smoking status: Never Smoker  . Smokeless tobacco: Never Used  Vaping Use  . Vaping Use: Never used  Substance and Sexual Activity  . Alcohol use: No  . Drug use: No  . Sexual activity: Never  Other Topics Concern  . Not on file  Social History Narrative   Brooke  Chavez lives with Brooke   Patient unable to talk   Little caffeine   Social Determinants of Health   Financial Resource Strain: Not on file  Food Insecurity: Not on file  Transportation Needs: Not on file  Physical Activity: Not on file  Stress: Not on file  Social Connections: Not on file  Intimate Partner Violence: Not on file    Medications:   Current Outpatient Medications on File Prior to Visit  Medication Sig Dispense Refill  . acetaminophen (TYLENOL) 325 MG tablet Take 2 tablets (650 mg total) by mouth every 6 (six) hours as needed for mild pain (or Fever >/= 101).    Marland Kitchen apixaban (ELIQUIS) 5 MG TABS tablet Take 1 tablet (5 mg total) by mouth 2 (two) times daily. 180 tablet 1  . atorvastatin (LIPITOR) 40 MG tablet Take 1 tablet (40 mg total) by mouth daily at 6 PM. 90 tablet 1  . diclofenac Sodium (VOLTAREN) 1 % GEL Apply 4 g topically 4 (four) times daily. 350 g 11  . furosemide (LASIX) 40 MG tablet Take 1 tablet by mouth once daily 30 tablet 2  . hydrOXYzine (ATARAX/VISTARIL) 25 MG tablet 1/2 -1 po BID prn anxiety 90 tablet 1  . losartan (COZAAR) 100 MG tablet Take 1 tablet (100 mg total) by mouth daily. 90 tablet 1  . Menthol-Zinc Oxide (CALMOSEPTINE) 0.44-20.6 % OINT Apply to affected area bid 113 g 11  . traZODone (DESYREL) 50 MG tablet Take 1 tablet (50 mg total) by mouth at bedtime. 90 tablet 1   No current facility-administered medications on file prior to visit.    Allergies:   Allergies  Allergen Reactions  . Oxycodone-Acetaminophen Anxiety and Other (See Comments)    Hallucinations  . Aspirin Nausea And Vomiting and Nausea Only    Other reaction(s): GI Upset (intolerance)    Physical Exam General: well developed, well nourished elderly African-American lady, seated, in no evident distress Head: head normocephalic and atraumatic.   Neck: supple with no carotid or supraclavicular bruits Cardiovascular: regular rate and rhythm, no murmurs Musculoskeletal: no  deformity Skin:  no rash/petichiae Vascular:  Normal pulses all extremities  Neurologic Exam Mental Status: Awake and fully alert.  Globally aphasic but able to follow only simple midline and few one-step commands.  Makes a few guttural and noises and nonsensical words.   Cranial Nerves: Fundoscopic exam reveals sharp disc margins. Pupils equal, briskly reactive to light. Extraocular movements full without nystagmus. Visual fields full to confrontation. Hearing intact. Facial sensation intact.  Slight right lower facial asymmetry tongue, palate moves normally and symmetrically.  Motor: Normal bulk and tone. Normal strength in all tested extremity muscles except mild right grip and ankle dorsiflexor weakness.  Diminished fine finger movements on the right.  Orbits left over right upper extremity.. Sensory.: intact to touch , pinprick , position and vibratory sensation.  Coordination: Rapid alternating movements normal in all  extremities. Finger-to-nose and heel-to-shin performed accurately bilaterally. Gait and Station: Arises from chair with slight difficulty. Stance is normal.  Uses a cane gait demonstrates normal stride length and balance . Able to heel, toe and tandem walk without difficulty.  Reflexes: 1+ and asymmetric and brisker on the right. Toes downgoing.     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     PLAN: I had a long discussion with the patient and Brooke Chavez regarding Brooke episodes of transient altered awareness and facial twitching likely representing partial complex seizures given Brooke prior history of large left MCA infarct.  The patient and Chavez seem reluctant to start anticonvulsants at the present time but are open to further evaluation.  In case she has further  recurrent episodes they are willing to try Keppra XR 500 mg daily.  I recommend we check EEG, screening carotid ultrasound, transplant Doppler studies, lipid profile and hemoglobin A1c.  She will continue Eliquis for stroke prevention and maintain aggressive risk factor modification with strict control of hypertension with blood pressure goal below 130/90, lipids with LDL cholesterol goal below 70 mg percent..  Continue to use cane for ambulation and follow fall safety precautions.  She will return for follow-up in the future in 3 months or call earlier if necessary.  Greater than 50% time during this 45-minute consultation visit were spent on counseling and coordination of care about episode of possible seizure and deficit from old stroke and answering questions Antony Contras, MD Note: This document was prepared with digital dictation and possible smart phrase technology. Any transcriptional errors that result from this process are unintentional.

## 2020-08-06 NOTE — Patient Instructions (Signed)
I had a long discussion with the patient and her daughter regarding her episodes of transient altered awareness and facial twitching likely representing partial complex seizures given her prior history of large left MCA infarct.  The patient and daughter seem reluctant to start anticonvulsants at the present time but are open to further evaluation.  In case she has further recurrent episodes they are willing to try Keppra XR 500 mg daily.  I recommend we check EEG, screening carotid ultrasound, transplant Doppler studies, lipid profile and hemoglobin A1c.  She will continue Eliquis for stroke prevention and maintain aggressive risk factor modification with strict control of hypertension with blood pressure goal below 130/90, lipids with LDL cholesterol goal below 70 mg percent..  Continue to use cane for ambulation and follow fall safety precautions.  She will return for follow-up in the future in 3 months or call earlier if necessary.

## 2020-08-07 ENCOUNTER — Ambulatory Visit: Payer: Medicare Other | Admitting: Oncology

## 2020-08-07 LAB — LIPID PANEL
Chol/HDL Ratio: 3.3 ratio (ref 0.0–4.4)
Cholesterol, Total: 182 mg/dL (ref 100–199)
HDL: 55 mg/dL (ref >39)
LDL Chol Calc (NIH): 107 mg/dL — ABNORMAL HIGH (ref 0–99)
Triglycerides: 111 mg/dL (ref 0–149)
VLDL Cholesterol Cal: 20 mg/dL (ref 5–40)

## 2020-08-07 LAB — HEMOGLOBIN A1C
Est. average glucose Bld gHb Est-mCnc: 131 mg/dL
Hgb A1c MFr Bld: 6.2 % — ABNORMAL HIGH (ref 4.8–5.6)

## 2020-08-08 ENCOUNTER — Other Ambulatory Visit: Payer: Self-pay | Admitting: Neurology

## 2020-08-08 DIAGNOSIS — E785 Hyperlipidemia, unspecified: Secondary | ICD-10-CM

## 2020-08-08 MED ORDER — ATORVASTATIN CALCIUM 40 MG PO TABS: 80.0000 mg | ORAL_TABLET | Freq: Every day | ORAL | 1 refills | Status: DC

## 2020-08-08 NOTE — Progress Notes (Signed)
Kindly inform the patient that cholesterol profile is not satisfactory and bad cholesterol is 107 with goal being below 70 hence I recommend he increase the dose of Lipitor to 80 mg daily.  Screening test for diabetes was satisfactory

## 2020-08-09 ENCOUNTER — Telehealth: Payer: Self-pay | Admitting: Emergency Medicine

## 2020-08-09 ENCOUNTER — Telehealth: Payer: Self-pay | Admitting: Neurology

## 2020-08-09 ENCOUNTER — Other Ambulatory Visit: Payer: Self-pay | Admitting: Neurology

## 2020-08-09 MED ORDER — ATORVASTATIN CALCIUM 80 MG PO TABS: 80.0000 mg | ORAL_TABLET | Freq: Every evening | ORAL | 1 refills | Status: DC

## 2020-08-09 NOTE — Telephone Encounter (Signed)
Spoke to Daughter earlier as documented.  Please see previous phone note.

## 2020-08-09 NOTE — Telephone Encounter (Signed)
Patient's daughter called regarding increase in atorvastatin, explained that her cholesterol was still not within ideal range and Dr. Leonie Man increased it to assist with managing her cholesterol better.  Daughter expressed appreciation for call and explantation.

## 2020-08-09 NOTE — Telephone Encounter (Signed)
Pt's daughter had a question about her mothers change in medication dosage that was done 08/06/20. Best call back is [phone]

## 2020-08-09 NOTE — Telephone Encounter (Signed)
-----   Message from Garvin Fila, MD sent at 08/08/2020  3:46 PM EST ----- Kindly inform the patient that cholesterol profile is not satisfactory and bad cholesterol is 107 with goal being below 70 hence I recommend he increase the dose of Lipitor to 80 mg daily.  Screening test for diabetes was satisfactory

## 2020-08-13 ENCOUNTER — Other Ambulatory Visit: Payer: Self-pay | Admitting: Nurse Practitioner

## 2020-08-13 ENCOUNTER — Ambulatory Visit: Payer: Medicare Other | Admitting: Oncology

## 2020-08-13 DIAGNOSIS — I480 Paroxysmal atrial fibrillation: Secondary | ICD-10-CM

## 2020-08-14 ENCOUNTER — Ambulatory Visit (HOSPITAL_BASED_OUTPATIENT_CLINIC_OR_DEPARTMENT_OTHER)
Admission: RE | Admit: 2020-08-14 | Discharge: 2020-08-14 | Disposition: A | Discharge status: Home / Self Care | Payer: Medicare Other | Source: Ambulatory Visit | Attending: Neurology | Admitting: Neurology

## 2020-08-14 ENCOUNTER — Other Ambulatory Visit: Payer: Self-pay

## 2020-08-14 ENCOUNTER — Ambulatory Visit (INDEPENDENT_AMBULATORY_CARE_PROVIDER_SITE_OTHER): Payer: Medicare Other | Admitting: Neurology

## 2020-08-14 ENCOUNTER — Inpatient Hospital Stay: Payer: Medicare Other | Attending: Oncology | Admitting: Oncology

## 2020-08-14 ENCOUNTER — Ambulatory Visit (HOSPITAL_COMMUNITY)
Admission: RE | Admit: 2020-08-14 | Discharge: 2020-08-14 | Disposition: A | Discharge status: Home / Self Care | Payer: Medicare Other | Source: Ambulatory Visit | Attending: Neurology | Admitting: Neurology

## 2020-08-14 VITALS — BP 156/46 | HR 72 | Temp 97.5°F | Resp 18 | Ht <= 58 in | Wt 172.3 lb

## 2020-08-14 DIAGNOSIS — G40209 Localization-related (focal) (partial) symptomatic epilepsy and epileptic syndromes with complex partial seizures, not intractable, without status epilepticus: Secondary | ICD-10-CM | POA: Diagnosis not present

## 2020-08-14 DIAGNOSIS — N289 Disorder of kidney and ureter, unspecified: Secondary | ICD-10-CM | POA: Insufficient documentation

## 2020-08-14 DIAGNOSIS — G40909 Epilepsy, unspecified, not intractable, without status epilepticus: Secondary | ICD-10-CM

## 2020-08-14 DIAGNOSIS — I6990 Unspecified sequelae of unspecified cerebrovascular disease: Secondary | ICD-10-CM

## 2020-08-14 DIAGNOSIS — K8689 Other specified diseases of pancreas: Secondary | ICD-10-CM | POA: Diagnosis not present

## 2020-08-14 DIAGNOSIS — I10 Essential (primary) hypertension: Secondary | ICD-10-CM | POA: Diagnosis not present

## 2020-08-14 DIAGNOSIS — I4892 Unspecified atrial flutter: Secondary | ICD-10-CM | POA: Diagnosis not present

## 2020-08-14 NOTE — Progress Notes (Signed)
Kindly inform the patient that carotid ultrasound study was unremarkable

## 2020-08-14 NOTE — Progress Notes (Signed)
  [facility] OFFICE PROGRESS NOTE   Diagnosis: Pancreas lesion  INTERVAL HISTORY:   Brooke Chavez returns for a scheduled visit.  She is here today with her daughter.  She has no complaint.  No diarrhea or flushing.  Her daughter noted Brooke Chavez had erythema at the left conjunctiva this morning. She is followed by Dr. Leonie Man for evaluation of recent complex partial seizures.  Objective:  Vital signs in last 24 hours:  Blood pressure (!) 156/46, pulse 72, temperature (!) 97.5 F (36.4 C), temperature source Tympanic, resp. rate 18, height 4\' 10"  (1.473 m), weight 172 lb 4.8 oz (78.2 kg), last menstrual period 11/05/1992.    Lymphatics: No cervical, supraclavicular, axillary, or inguinal nodes Resp: Distant breath sounds, inspiratory rhonchi at the lower posterior chest bilaterally, no respiratory distress Cardio: Irregular, 2/6 systolic murmur GI: No mass, nontender, no hepatosplenomegaly Vascular: No leg edema Neuro: Alert, follows commands, ambulates to the exam table with assistance, right leg weakness    Portacath/PICC-without erythema  Lab Results:  Lab Results  Component Value Date   WBC 6.3 05/14/2020   HGB 11.3 05/14/2020   HCT 35.1 05/14/2020   MCV 98 (H) 05/14/2020   PLT 229 05/14/2020   NEUTROABS 3.0 05/14/2020    CMP  Lab Results  Component Value Date   NA 142 05/14/2020   K 4.5 05/14/2020   CL 104 05/14/2020   CO2 19 (L) 05/14/2020   GLUCOSE 83 05/14/2020   BUN 27 05/14/2020   CREATININE 1.93 (H) 05/14/2020   CALCIUM 9.8 05/14/2020   PROT 7.4 05/14/2020   ALBUMIN 4.3 05/14/2020   AST 25 05/14/2020   ALT 17 05/14/2020   ALKPHOS 98 05/14/2020   BILITOT 0.5 05/14/2020   GFRNONAA 24 (L) 05/14/2020   GFRAA 28 (L) 05/14/2020    Medications: I have reviewed the patient's current medications.   Assessment/Plan: 1. 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 Atrial flutter 3. 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 Left MCA CVA 01/24/2018 5. Hypertension 6. Renal insufficiency     Disposition: Ms. Cordner appears stable.  She does not have symptoms to suggest development of a pancreas neoplasm.  She will continue [facility].  She will be scheduled for a CT abdomen/pelvis without IV contrast in 1 year.  She will continue follow-up with Dr. Leonie Man for post stroke management and evaluation for seizures.  She plans to schedule an appoint with Dr. Percival Spanish for follow-up of the arrhythmia and heart failure  Brooke Coder, MD  08/14/2020  12:34 PM

## 2020-08-14 NOTE — Progress Notes (Signed)
Carotid duplex bilateral and TCD studies completed.   Preliminary report completed on behalf of Maudry Mayhew, RDMS, RVT, RDCS  Please see CV Proc for preliminary results.   Darlin Coco, RDMS

## 2020-08-14 NOTE — Progress Notes (Signed)
Kindly inform the patient that transcranial doppler study of brain was technically difficult due to poor bony windows but shows no major blockages to worry about

## 2020-08-15 ENCOUNTER — Telehealth: Payer: Self-pay | Admitting: Emergency Medicine

## 2020-08-15 ENCOUNTER — Telehealth: Payer: Self-pay | Admitting: Oncology

## 2020-08-15 ENCOUNTER — Encounter (HOSPITAL_COMMUNITY): Payer: Medicare Other

## 2020-08-15 ENCOUNTER — Other Ambulatory Visit (HOSPITAL_COMMUNITY): Payer: Medicare Other

## 2020-08-15 NOTE — Telephone Encounter (Signed)
-----   Message from Garvin Fila, MD sent at 08/14/2020  5:54 PM EST ----- Kindly inform the patient that carotid ultrasound study was unremarkable

## 2020-08-15 NOTE — Telephone Encounter (Signed)
-----   Message from Garvin Fila, MD sent at 08/14/2020  5:56 PM EST ----- Kindly inform the patient that transcranial doppler study of brain was technically difficult due to poor bony windows but shows no major blockages to worry about

## 2020-08-15 NOTE — Telephone Encounter (Signed)
Patient's daughter called back and I informed her of Dr. Clydene Fake findings regarding patient's results.  Daughter denied any questions and expressed appreciation.

## 2020-08-15 NOTE — Telephone Encounter (Signed)
Scheduled appointment per 1/4 los. Mailed updated calendar to patient.

## 2020-08-20 NOTE — Progress Notes (Signed)
Currently inform the patient that EEG study was normal

## 2020-08-21 ENCOUNTER — Telehealth: Payer: Self-pay | Admitting: Emergency Medicine

## 2020-08-21 NOTE — Telephone Encounter (Signed)
-----   Message from Garvin Fila, MD sent at 08/20/2020  5:35 PM EST ----- Currently inform the patient that EEG study was normal

## 2020-08-21 NOTE — Telephone Encounter (Signed)
LVM of findings from Dr. Clydene Fake review of patient's EEG per DPR.

## 2020-08-24 ENCOUNTER — Telehealth: Payer: Self-pay

## 2020-08-24 NOTE — Telephone Encounter (Signed)
  Prescription Request  08/24/2020  What is the name of the medication or equipment? [email or web address]  Have you contacted your pharmacy to request a refill? (if applicable) yes  Which pharmacy would you like this sent to? [facility]   Patient notified that their request is being sent to the clinical staff for review and that they should receive a response within 2 business days.

## 2020-08-24 NOTE — Telephone Encounter (Signed)
Cancel the med refill. Daughter said she has refills.

## 2020-08-24 NOTE — Telephone Encounter (Signed)
Closing encounter since pt has refills

## 2020-08-28 NOTE — Progress Notes (Signed)
Cardiology Office Note   Date:  08/29/2020   ID:  Brooke, Chavez October 23, 1940, MRN [id number]  PCP:  [name], FNP  Cardiologist:   [name], MD   Chief Complaint  Patient presents with  . Atrial Fibrillation      History of Present Illness: Brooke Chavez is a 80 y.o. female who presents for follow up of atrial flutter. She is s/p ablation and redo ablation. Post procedure she had some paroxysms of probable fibrillation though these were avery brief period. She has had lots of atrial ectopy and runs of atrial tachycardia. However, she had no sustained tachyarrhythmias. She had some bradycardic episodes but no sustained bradycardia arrhythmias. There were some junctional beats. We have managed her medically for this. She had severe MR. On5/24/19 she underwent minimally invasive MV repair, MAZE, and closure of PFO. She had been on Eliquis pre op, Coumadin was started post op. She was discharged 01/14/18. On 01/24/18 she presented to an outside hospital with a left brain stroke. Her INR on presentation was 1.7.    Since I last saw her she has had no acute complaints.  She saw her oncologist to follow some lesions on her pancreas that have been stable.  He hurt All on her exam.  We suggested she follow-up with me.  She gets around with a cane.  Today's birthday. The patient denies any new symptoms such as chest discomfort, neck or arm discomfort. There has been no new shortness of breath, PND or orthopnea. There have been no reported palpitations, presyncope or syncope.   Past Medical History:  Diagnosis Date  . Atrial flutter ([place])    Typical by EKG diagnosis 9/11 s/p CIT ablation 11/11  . Bradycardia   . Chronic diastolic congestive heart failure ([place])   . 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 DJD (degenerative joint disease)   . Dysrhythmia   . Femur fracture, left ([place]) 2013  . Global aphasia   . Hyperlipidemia    x5  years  . Hypertension    Since 1997  . 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 Liver masses 11/13/2017   Multiple small nodules seen on CT and MRI  . Mitral regurgitation    severe  . 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 Stroke ([place])   . 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 A FLUTTER ABLATION N/A 06/27/2011   Procedure: ABLATION A FLUTTER;  Surgeon: Thompson Grayer, MD;  Location: [facility] CATH LAB;  Service: Cardiovascular;  Laterality: N/A;  . ATRIAL ABLATION SURGERY  06/2010  . HIP SURGERY     Left (fracture) 3/13  . JOINT REPLACEMENT     both knees   . KNEE ARTHROSCOPY     Right  . LUMBAR SPINE SURGERY    . MITRAL VALVE REPAIR Right 01/07/2018   Procedure: MINIMALLY INVASIVE MITRAL VALVE REPAIR using LivaNova ring size 28 MM;  Surgeon: Rexene Alberts, MD;  Location: [place];  Service: Open Heart Surgery;  Laterality: Right;  . PATENT FORAMEN OVALE(PFO) CLOSURE N/A 01/07/2018   Procedure: PATENT FORAMEN OVALE (PFO) CLOSURE;  Surgeon: Rexene Alberts, MD;  Location: [place];  Service: Open Heart Surgery;  Laterality: N/A;  . RIGHT/LEFT HEART CATH AND CORONARY ANGIOGRAPHY N/A 11/11/2017   Procedure: RIGHT/LEFT HEART CATH AND CORONARY ANGIOGRAPHY;  Surgeon: Sherren Mocha, MD;  Location: [place] CV LAB;  Service: Cardiovascular;  Laterality: N/A;  . TEE WITHOUT CARDIOVERSION N/A 09/18/2017   Procedure: TRANSESOPHAGEAL ECHOCARDIOGRAM (TEE);  Surgeon: Skeet Latch, MD;  Location: [place];  Service: Cardiovascular;  Laterality: N/A;  . TEE WITHOUT CARDIOVERSION N/A 01/07/2018   Procedure: TRANSESOPHAGEAL ECHOCARDIOGRAM (TEE);  Surgeon: Rexene Alberts,  MD;  Location: [place];  Service: Open Heart Surgery;  Laterality: N/A;  . TOTAL KNEE ARTHROPLASTY     Left  . TUBAL LIGATION       Current Outpatient Medications  Medication Sig Dispense Refill  . acetaminophen (TYLENOL) 325 MG tablet Take 2 tablets (650 mg total) by mouth every 6 (six) hours as needed for mild pain (or Fever >/= 101).    Marland Kitchen atorvastatin (LIPITOR) 40 MG tablet Take 2 tablets (80 mg total) by mouth daily at 6 PM. 90 tablet 1  . atorvastatin (LIPITOR) 80 MG tablet Take 1 tablet (80 mg total) by mouth every evening. (Patient not taking: Reported on 08/14/2020) 90 tablet 1  . ELIQUIS 5 MG TABS tablet Take 1 tablet by mouth twice daily 180 tablet 1  . furosemide (LASIX) 40 MG tablet Take 1 tablet by mouth once daily 30 tablet 2  . hydrOXYzine (ATARAX/VISTARIL) 25 MG tablet 1/2 -1 po BID prn anxiety 90 tablet 1  . losartan (COZAAR) 100 MG tablet Take 1 tablet (100 mg total) by mouth daily. 90 tablet 1  . traZODone (DESYREL) 50 MG tablet Take 1 tablet (50 mg total) by mouth at bedtime. 90 tablet 1   No current facility-administered medications for this visit.    Allergies:   Oxycodone-acetaminophen and Aspirin    ROS:  Please see the history of present illness.   Otherwise, review of systems are positive for none.   All other systems are reviewed and negative.    PHYSICAL EXAM: VS:  BP 126/68   Pulse 81   Ht 4\' 10"  (1.473 m)   Wt 171 lb 3.2 oz (77.7 kg)   LMP 11/05/1992   BMI 35.78 kg/m  , BMI Body mass index is 35.78 kg/m.  GENERAL:  Well appearing NECK:  No jugular venous distention, waveform within normal limits, carotid upstroke brisk and symmetric, no bruits, no thyromegaly LUNGS:  Clear to auscultation bilaterally CHEST:  Unremarkable HEART:  PMI not displaced or sustained,S1 and S2 within normal limits, no S3, no clicks, no rubs, no murmurs, irregular ABD:  Flat, positive bowel sounds normal in frequency in pitch, no bruits, no rebound, no guarding, no midline  pulsatile mass, no hepatomegaly, no splenomegaly EXT:  2 plus pulses throughout, no edema, no cyanosis no clubbing   EKG:  EKG is  ordered today.  The ekg ordered today demonstrates atrial fibrillation, rate 71 interventricular conduction delay, left axis deviation, left anterior fascicular block   Recent Labs: 01/05/2020: BNP 426.7 05/14/2020: ALT 17; BUN 27; Creatinine, Ser 1.93; Hemoglobin 11.3; Platelets 229; Potassium 4.5; Sodium 142    Lipid Panel    Component Value Date/Time   CHOL 182 08/06/2020 1234   TRIG 111 08/06/2020 1234   TRIG 58 09/19/2014 0938   HDL 55 08/06/2020 1234   HDL 67 09/19/2014 0938   CHOLHDL 3.3 08/06/2020 1234   CHOLHDL 3.0 01/25/2018  0618   VLDL 18 01/25/2018 0618   LDLCALC 107 (H) 08/06/2020 1234   LDLCALC 83 11/21/2013 0830      Wt Readings from Last 3 Encounters:  08/29/20 171 lb 3.2 oz (77.7 kg)  08/14/20 172 lb 4.8 oz (78.2 kg)  05/28/20 165 lb (74.8 kg)      Other studies Reviewed: Additional studies/ records that were reviewed today include: Oncology notes. Review of the above records demonstrates:  Please see elsewhere in the note.     ASSESSMENT AND PLAN:  ATRIAL FLUTTER -  She has had no symptoms with her persistent atrial fibrillation.  She tolerates anticoagulation.  She does turn 80-day but does not other criteria to have her Eliquis dose reduced.  She will continue on the meds as listed.   MITRAL REGURGITATION -  I will check an echocardiogram in June as it would have been 3 years.  She has had stable repair at this time.   HYPERTENSION -  The blood pressure is at target.  No change in therapy.   BRADYCARDIA - She has had no symptomatic bradycardia arrhythmias.  No change in therapy.    Current medicines are reviewed at length with the patient today.  The patient does not have concerns regarding medicines.  The following changes have been made:  None  Labs/ tests ordered today include:    Orders Placed This  Encounter  Procedures  . EKG 12-Lead  . ECHOCARDIOGRAM COMPLETE     Disposition:   FU with me one year.  I will follow-up with the echo as indicated   Signed, [name], MD  08/29/2020 10:18 AM    [place]

## 2020-08-29 ENCOUNTER — Other Ambulatory Visit: Payer: Self-pay

## 2020-08-29 ENCOUNTER — Encounter: Payer: Self-pay | Admitting: Cardiology

## 2020-08-29 ENCOUNTER — Ambulatory Visit (INDEPENDENT_AMBULATORY_CARE_PROVIDER_SITE_OTHER): Payer: Medicare Other | Admitting: Cardiology

## 2020-08-29 VITALS — BP 126/68 | HR 81 | Ht <= 58 in | Wt 171.2 lb

## 2020-08-29 DIAGNOSIS — I483 Typical atrial flutter: Secondary | ICD-10-CM

## 2020-08-29 DIAGNOSIS — R001 Bradycardia, unspecified: Secondary | ICD-10-CM | POA: Diagnosis not present

## 2020-08-29 DIAGNOSIS — I10 Essential (primary) hypertension: Secondary | ICD-10-CM

## 2020-08-29 DIAGNOSIS — Z9889 Other specified postprocedural states: Secondary | ICD-10-CM | POA: Diagnosis not present

## 2020-08-29 NOTE — Patient Instructions (Signed)
Medication Instructions:  The current medical regimen is effective;  continue present plan and medications.  *If you need a refill on your cardiac medications before your next appointment, please call your pharmacy*  Testing/Procedures: Your physician has requested that you have an echocardiogram in June 2022. Echocardiography is a painless test that uses sound waves to create images of your heart. It provides your doctor with information about the size and shape of your heart and how well your heart's chambers and valves are working. This procedure takes approximately one hour. There are no restrictions for this procedure.  Follow-Up: At [facility], you and your health needs are our priority.  As part of our continuing mission to provide you with exceptional heart care, we have created designated Provider Care Teams.  These Care Teams include your primary Cardiologist (physician) and Advanced Practice Providers (APPs -  Physician Assistants and Nurse Practitioners) who all work together to provide you with the care you need, when you need it.  We recommend signing up for the patient portal called "MyChart".  Sign up information is provided on this After Visit Summary.  MyChart is used to connect with patients for Virtual Visits (Telemedicine).  Patients are able to view lab/test results, encounter notes, upcoming appointments, etc.  Non-urgent messages can be sent to your provider as well.   To learn more about what you can do with MyChart, go to [email or web address].    Your next appointment:   12 month(s)  The format for your next appointment:   In Person  Provider:   [name], MD   Thank you for choosing [facility]!!

## 2020-09-04 DIAGNOSIS — I639 Cerebral infarction, unspecified: Secondary | ICD-10-CM | POA: Diagnosis not present

## 2020-09-04 DIAGNOSIS — M6281 Muscle weakness (generalized): Secondary | ICD-10-CM | POA: Diagnosis not present

## 2020-09-04 DIAGNOSIS — Z9181 History of falling: Secondary | ICD-10-CM | POA: Diagnosis not present

## 2020-09-04 DIAGNOSIS — R269 Unspecified abnormalities of gait and mobility: Secondary | ICD-10-CM | POA: Diagnosis not present

## 2020-09-10 ENCOUNTER — Telehealth: Payer: Self-pay

## 2020-09-10 NOTE — Telephone Encounter (Signed)
Daughter aware and verbalizes understanding per dpr.

## 2020-09-10 NOTE — Telephone Encounter (Signed)
Daughter aware to treat symptoms with otc medication, rest and stay hydrated.  Seek medication antitension if she starts having chest pain or trouble breathing.  Daughter would like to know with patient HTN what would be best for over the counter?

## 2020-09-10 NOTE — Telephone Encounter (Signed)
Mucinex for cough. Let me know if symptoms worsen

## 2020-09-21 ENCOUNTER — Other Ambulatory Visit: Payer: Self-pay | Admitting: Nurse Practitioner

## 2020-09-21 DIAGNOSIS — R609 Edema, unspecified: Secondary | ICD-10-CM

## 2020-09-27 DIAGNOSIS — R269 Unspecified abnormalities of gait and mobility: Secondary | ICD-10-CM | POA: Diagnosis not present

## 2020-09-27 DIAGNOSIS — I639 Cerebral infarction, unspecified: Secondary | ICD-10-CM | POA: Diagnosis not present

## 2020-09-27 DIAGNOSIS — Z9181 History of falling: Secondary | ICD-10-CM | POA: Diagnosis not present

## 2020-09-27 DIAGNOSIS — M6281 Muscle weakness (generalized): Secondary | ICD-10-CM | POA: Diagnosis not present

## 2020-10-02 DIAGNOSIS — I639 Cerebral infarction, unspecified: Secondary | ICD-10-CM | POA: Diagnosis not present

## 2020-10-02 DIAGNOSIS — Z9181 History of falling: Secondary | ICD-10-CM | POA: Diagnosis not present

## 2020-10-02 DIAGNOSIS — R269 Unspecified abnormalities of gait and mobility: Secondary | ICD-10-CM | POA: Diagnosis not present

## 2020-10-02 DIAGNOSIS — M6281 Muscle weakness (generalized): Secondary | ICD-10-CM | POA: Diagnosis not present

## 2020-10-04 DIAGNOSIS — M6281 Muscle weakness (generalized): Secondary | ICD-10-CM | POA: Diagnosis not present

## 2020-10-04 DIAGNOSIS — I639 Cerebral infarction, unspecified: Secondary | ICD-10-CM | POA: Diagnosis not present

## 2020-10-04 DIAGNOSIS — R269 Unspecified abnormalities of gait and mobility: Secondary | ICD-10-CM | POA: Diagnosis not present

## 2020-10-04 DIAGNOSIS — Z9181 History of falling: Secondary | ICD-10-CM | POA: Diagnosis not present

## 2020-10-09 DIAGNOSIS — M6281 Muscle weakness (generalized): Secondary | ICD-10-CM | POA: Diagnosis not present

## 2020-10-09 DIAGNOSIS — R269 Unspecified abnormalities of gait and mobility: Secondary | ICD-10-CM | POA: Diagnosis not present

## 2020-10-09 DIAGNOSIS — Z9181 History of falling: Secondary | ICD-10-CM | POA: Diagnosis not present

## 2020-10-09 DIAGNOSIS — I639 Cerebral infarction, unspecified: Secondary | ICD-10-CM | POA: Diagnosis not present

## 2020-10-11 DIAGNOSIS — I639 Cerebral infarction, unspecified: Secondary | ICD-10-CM | POA: Diagnosis not present

## 2020-10-11 DIAGNOSIS — Z9181 History of falling: Secondary | ICD-10-CM | POA: Diagnosis not present

## 2020-10-11 DIAGNOSIS — R269 Unspecified abnormalities of gait and mobility: Secondary | ICD-10-CM | POA: Diagnosis not present

## 2020-10-11 DIAGNOSIS — M6281 Muscle weakness (generalized): Secondary | ICD-10-CM | POA: Diagnosis not present

## 2020-10-16 DIAGNOSIS — Z9181 History of falling: Secondary | ICD-10-CM | POA: Diagnosis not present

## 2020-10-16 DIAGNOSIS — R269 Unspecified abnormalities of gait and mobility: Secondary | ICD-10-CM | POA: Diagnosis not present

## 2020-10-16 DIAGNOSIS — I639 Cerebral infarction, unspecified: Secondary | ICD-10-CM | POA: Diagnosis not present

## 2020-10-16 DIAGNOSIS — M6281 Muscle weakness (generalized): Secondary | ICD-10-CM | POA: Diagnosis not present

## 2020-10-18 DIAGNOSIS — Z9181 History of falling: Secondary | ICD-10-CM | POA: Diagnosis not present

## 2020-10-18 DIAGNOSIS — R269 Unspecified abnormalities of gait and mobility: Secondary | ICD-10-CM | POA: Diagnosis not present

## 2020-10-18 DIAGNOSIS — I639 Cerebral infarction, unspecified: Secondary | ICD-10-CM | POA: Diagnosis not present

## 2020-10-18 DIAGNOSIS — M6281 Muscle weakness (generalized): Secondary | ICD-10-CM | POA: Diagnosis not present

## 2020-10-23 DIAGNOSIS — M6281 Muscle weakness (generalized): Secondary | ICD-10-CM | POA: Diagnosis not present

## 2020-10-23 DIAGNOSIS — R269 Unspecified abnormalities of gait and mobility: Secondary | ICD-10-CM | POA: Diagnosis not present

## 2020-10-23 DIAGNOSIS — Z9181 History of falling: Secondary | ICD-10-CM | POA: Diagnosis not present

## 2020-10-23 DIAGNOSIS — I639 Cerebral infarction, unspecified: Secondary | ICD-10-CM | POA: Diagnosis not present

## 2020-10-25 DIAGNOSIS — M6281 Muscle weakness (generalized): Secondary | ICD-10-CM | POA: Diagnosis not present

## 2020-10-25 DIAGNOSIS — Z9181 History of falling: Secondary | ICD-10-CM | POA: Diagnosis not present

## 2020-10-25 DIAGNOSIS — R269 Unspecified abnormalities of gait and mobility: Secondary | ICD-10-CM | POA: Diagnosis not present

## 2020-10-25 DIAGNOSIS — I639 Cerebral infarction, unspecified: Secondary | ICD-10-CM | POA: Diagnosis not present

## 2020-11-01 DIAGNOSIS — M6281 Muscle weakness (generalized): Secondary | ICD-10-CM | POA: Diagnosis not present

## 2020-11-01 DIAGNOSIS — Z9181 History of falling: Secondary | ICD-10-CM | POA: Diagnosis not present

## 2020-11-01 DIAGNOSIS — R269 Unspecified abnormalities of gait and mobility: Secondary | ICD-10-CM | POA: Diagnosis not present

## 2020-11-01 DIAGNOSIS — I639 Cerebral infarction, unspecified: Secondary | ICD-10-CM | POA: Diagnosis not present

## 2020-11-06 ENCOUNTER — Other Ambulatory Visit: Payer: Self-pay

## 2020-11-06 ENCOUNTER — Encounter: Payer: Self-pay | Admitting: Nurse Practitioner

## 2020-11-06 ENCOUNTER — Ambulatory Visit (INDEPENDENT_AMBULATORY_CARE_PROVIDER_SITE_OTHER): Payer: Medicare Other | Admitting: Nurse Practitioner

## 2020-11-06 VITALS — BP 142/57 | HR 51 | Temp 97.7°F | Resp 20 | Ht <= 58 in | Wt 167.0 lb

## 2020-11-06 DIAGNOSIS — F419 Anxiety disorder, unspecified: Secondary | ICD-10-CM | POA: Diagnosis not present

## 2020-11-06 MED ORDER — ESCITALOPRAM OXALATE 10 MG PO TABS: 10.0000 mg | ORAL_TABLET | Freq: Every day | ORAL | 5 refills | Status: DC

## 2020-11-06 NOTE — Progress Notes (Signed)
   Subjective:    Patient ID: Brooke Chavez, female    DOB: 27-Dec-1940, 80 y.o.   MRN: [id number]   Chief Complaint: medication discussion  HPI Patient brought I today by her daughter who is her caregiver. SHe has multiple medical problems and is on lots of meds. She has been doing well, but her daughter says that she has been getting really frustrated and angry. She has been doing this randomly. Stays aggravated for several minutes then she starts crying. This started about 3- 4 weeks ago. She has had no medication changes. She is on hydroxyzine on Tues and thurs when [facility] [place]. She has been sleeping well on trazadone.  * her caregiver, (her niece) was shot and died several weeks ago.  Review of Systems  Constitutional: Negative for diaphoresis.  Eyes: Negative for pain.  Respiratory: Negative for shortness of breath.   Cardiovascular: Negative for chest pain, palpitations and leg swelling.  Gastrointestinal: Negative for abdominal pain.  Endocrine: Negative for polydipsia.  Skin: Negative for rash.  Neurological: Negative for dizziness, weakness and headaches.  Hematological: Does not bruise/bleed easily.  Psychiatric/Behavioral: The patient is nervous/anxious.   All other systems reviewed and are negative.      Objective:   Physical Exam Vitals and nursing note reviewed.  Constitutional:      Appearance: Normal appearance.  Cardiovascular:     Rate and Rhythm: Regular rhythm.     Pulses: Normal pulses.     Heart sounds: Normal heart sounds.  Pulmonary:     Effort: Pulmonary effort is normal.     Breath sounds: Normal breath sounds.  Skin:    General: Skin is warm.  Neurological:     General: No focal deficit present.     Mental Status: She is alert.  Psychiatric:        Mood and Affect: Mood normal.        Behavior: Behavior normal.     Comments: Patient gets very frustrated during visit because she does not want to go on anymore meds.     BP (!) 142/57   Pulse (!) 51   Temp 97.7 F (36.5 C) (Temporal)   Resp 20   Ht 4\' 10"  (1.473 m)   Wt 167 lb (75.8 kg)   LMP 11/05/1992   SpO2 98%   BMI 34.90 kg/m         Assessment & Plan:  Brooke Chavez in today with chief complaint of No chief complaint on file.   1. Anxiety Stress management Daughters are aware of medication side effects They will let me know how she is doing - escitalopram (LEXAPRO) 10 MG tablet; Take 1 tablet (10 mg total) by mouth daily.  Dispense: 30 tablet; Refill: 5    The above assessment and management plan was discussed with the patient. The patient verbalized understanding of and has agreed to the management plan. Patient is aware to call the clinic if symptoms persist or worsen. Patient is aware when to return to the clinic for a follow-up visit. Patient educated on when it is appropriate to go to the emergency department.   Mary-Margaret Hassell Done, FNP

## 2020-11-08 DIAGNOSIS — M6281 Muscle weakness (generalized): Secondary | ICD-10-CM | POA: Diagnosis not present

## 2020-11-08 DIAGNOSIS — Z9181 History of falling: Secondary | ICD-10-CM | POA: Diagnosis not present

## 2020-11-08 DIAGNOSIS — I639 Cerebral infarction, unspecified: Secondary | ICD-10-CM | POA: Diagnosis not present

## 2020-11-08 DIAGNOSIS — R269 Unspecified abnormalities of gait and mobility: Secondary | ICD-10-CM | POA: Diagnosis not present

## 2020-11-12 ENCOUNTER — Telehealth (INDEPENDENT_AMBULATORY_CARE_PROVIDER_SITE_OTHER): Payer: Medicare Other | Admitting: Neurology

## 2020-11-12 DIAGNOSIS — G40209 Localization-related (focal) (partial) symptomatic epilepsy and epileptic syndromes with complex partial seizures, not intractable, without status epilepticus: Secondary | ICD-10-CM

## 2020-11-12 NOTE — Progress Notes (Signed)
Virtual Visit via Video Note  I connected with Brooke Chavez on 11/12/20 at  2:00 PM EDT by a video enabled telemedicine application and verified that I am speaking with the correct person using two identifiers.  Location: Patient: at home Provider: at [facility] office   I discussed the limitations of evaluation and management by telemedicine and the availability of in person appointments. The patient expressed understanding and agreed to proceed.  I was initially able to connect via video with the patient but due to technical limitations I could not complete my exam and had to do most of this visit via telephone.  History of Present Illness: This was a schedule office visit but upon patient's request got changed to a virtual video visit following her last consultation visit with me on 08/06/2020.  Visit was facilitated by patient's daughter.  Patient is doing well.  She has had no recurrent TIA or stroke symptoms.  She has not had any recurrent seizures.  She is tolerating Keppra well without any side effects.  She had EEG done on 08/20/2020 which was normal.  Carotid ultrasound and transcranial Doppler studies both done on 08/14/2020 showed no large vessel stenosis or occlusion in extracranial intracranial vessels.  Patient stated she was doing well and has no new complaints.   Observations/Objective: Limited due to constraints of virtual video visit and due to technical limitations patient could not be examined.  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   Follow Up Instructions:   I had a long discussion with the patient and her daughter regarding her episodes of transient altered awareness and facial twitching likely represent complex partial seizures.  I recommend she continue Keppra and the current dosage for  seizure prophylaxis and Eliquis for stroke prophylaxis and maintain aggressive risk factor modificationwith strict control of hypertension with blood pressure goal below 130/90, lipids with LDL cholesterol goal below 70 mg percent..    She will return for follow-up in the future in 6 months or call earlier if necessary I discussed the assessment and treatment plan with the patient. The patient was provided an opportunity to ask questions and all were answered. The patient agreed with the plan and demonstrated an understanding of the instructions.   The patient was advised to call back or seek an in-person evaluation if the symptoms worsen or if the condition fails to improve as anticipated.  I provided 25 minutes of non-face-to-face time during this encounter.   Antony Contras, MD

## 2020-11-15 ENCOUNTER — Ambulatory Visit: Payer: Self-pay | Admitting: Nurse Practitioner

## 2020-11-15 ENCOUNTER — Other Ambulatory Visit: Payer: Self-pay | Admitting: Nurse Practitioner

## 2020-11-15 DIAGNOSIS — I10 Essential (primary) hypertension: Secondary | ICD-10-CM

## 2020-11-27 ENCOUNTER — Ambulatory Visit (HOSPITAL_BASED_OUTPATIENT_CLINIC_OR_DEPARTMENT_OTHER)
Admission: RE | Admit: 2020-11-27 | Discharge: 2020-11-27 | Disposition: A | Discharge status: Home / Self Care | Payer: Medicare Other | Source: Ambulatory Visit | Attending: Oncology | Admitting: Oncology

## 2020-11-27 ENCOUNTER — Other Ambulatory Visit: Payer: Self-pay

## 2020-11-27 DIAGNOSIS — K5730 Diverticulosis of large intestine without perforation or abscess without bleeding: Secondary | ICD-10-CM | POA: Diagnosis not present

## 2020-11-27 DIAGNOSIS — K8689 Other specified diseases of pancreas: Secondary | ICD-10-CM | POA: Diagnosis not present

## 2020-11-27 DIAGNOSIS — K7689 Other specified diseases of liver: Secondary | ICD-10-CM | POA: Diagnosis not present

## 2020-11-27 DIAGNOSIS — K862 Cyst of pancreas: Secondary | ICD-10-CM | POA: Diagnosis not present

## 2020-11-28 ENCOUNTER — Ambulatory Visit (INDEPENDENT_AMBULATORY_CARE_PROVIDER_SITE_OTHER): Payer: Medicare Other | Admitting: Nurse Practitioner

## 2020-11-28 ENCOUNTER — Encounter: Payer: Self-pay | Admitting: Nurse Practitioner

## 2020-11-28 VITALS — BP 163/62 | HR 70 | Temp 97.9°F | Resp 20 | Ht <= 58 in | Wt 164.0 lb

## 2020-11-28 DIAGNOSIS — I69391 Dysphagia following cerebral infarction: Secondary | ICD-10-CM | POA: Diagnosis not present

## 2020-11-28 DIAGNOSIS — I5032 Chronic diastolic (congestive) heart failure: Secondary | ICD-10-CM | POA: Diagnosis not present

## 2020-11-28 DIAGNOSIS — E785 Hyperlipidemia, unspecified: Secondary | ICD-10-CM

## 2020-11-28 DIAGNOSIS — I10 Essential (primary) hypertension: Secondary | ICD-10-CM | POA: Diagnosis not present

## 2020-11-28 DIAGNOSIS — I484 Atypical atrial flutter: Secondary | ICD-10-CM

## 2020-11-28 DIAGNOSIS — F482 Pseudobulbar affect: Secondary | ICD-10-CM | POA: Diagnosis not present

## 2020-11-28 DIAGNOSIS — I69320 Aphasia following cerebral infarction: Secondary | ICD-10-CM

## 2020-11-28 DIAGNOSIS — Z6830 Body mass index (BMI) 30.0-30.9, adult: Secondary | ICD-10-CM

## 2020-11-28 DIAGNOSIS — I69351 Hemiplegia and hemiparesis following cerebral infarction affecting right dominant side: Secondary | ICD-10-CM | POA: Diagnosis not present

## 2020-11-28 DIAGNOSIS — N1831 Chronic kidney disease, stage 3a: Secondary | ICD-10-CM

## 2020-11-28 DIAGNOSIS — M810 Age-related osteoporosis without current pathological fracture: Secondary | ICD-10-CM | POA: Diagnosis not present

## 2020-11-28 DIAGNOSIS — K219 Gastro-esophageal reflux disease without esophagitis: Secondary | ICD-10-CM

## 2020-11-28 NOTE — Patient Instructions (Signed)
Fall Prevention in the Home, Adult Falls can cause injuries and can happen to people of all ages. There are many things you can do to make your home safe and to help prevent falls. Ask for help when making these changes. What actions can I take to prevent falls? General Instructions  Use good lighting in all rooms. Replace any light bulbs that burn out.  Turn on the lights in dark areas. Use night-lights.  Keep items that you use often in easy-to-reach places. Lower the shelves around your home if needed.  Set up your furniture so you have a clear path. Avoid moving your furniture around.  Do not have throw rugs or other things on the floor that can make you trip.  Avoid walking on wet floors.  If any of your floors are uneven, fix them.  Add color or contrast paint or tape to clearly mark and help you see: ? Grab bars or handrails. ? First and last steps of staircases. ? Where the edge of each step is.  If you use a stepladder: ? Make sure that it is fully opened. Do not climb a closed stepladder. ? Make sure the sides of the stepladder are locked in place. ? Ask someone to hold the stepladder while you use it.  Know where your pets are when moving through your home. What can I do in the bathroom?  Keep the floor dry. Clean up any water on the floor right away.  Remove soap buildup in the tub or shower.  Use nonskid mats or decals on the floor of the tub or shower.  Attach bath mats securely with double-sided, nonslip rug tape.  If you need to sit down in the shower, use a plastic, nonslip stool.  Install grab bars by the toilet and in the tub and shower. Do not use towel bars as grab bars.      What can I do in the bedroom?  Make sure that you have a light by your bed that is easy to reach.  Do not use any sheets or blankets for your bed that hang to the floor.  Have a firm chair with side arms that you can use for support when you get dressed. What can I do in  the kitchen?  Clean up any spills right away.  If you need to reach something above you, use a step stool with a grab bar.  Keep electrical cords out of the way.  Do not use floor polish or wax that makes floors slippery. What can I do with my stairs?  Do not leave any items on the stairs.  Make sure that you have a light switch at the top and the bottom of the stairs.  Make sure that there are handrails on both sides of the stairs. Fix handrails that are broken or loose.  Install nonslip stair treads on all your stairs.  Avoid having throw rugs at the top or bottom of the stairs.  Choose a carpet that does not hide the edge of the steps on the stairs.  Check carpeting to make sure that it is firmly attached to the stairs. Fix carpet that is loose or worn. What can I do on the outside of my home?  Use bright outdoor lighting.  Fix the edges of walkways and driveways and fix any cracks.  Remove anything that might make you trip as you walk through a door, such as a raised step or threshold.  Trim any   bushes or trees on paths to your home.  Check to see if handrails are loose or broken and that both sides of all steps have handrails.  Install guardrails along the edges of any raised decks and porches.  Clear paths of anything that can make you trip, such as tools or rocks.  Have leaves, snow, or ice cleared regularly.  Use sand or salt on paths during winter.  Clean up any spills in your garage right away. This includes grease or oil spills. What other actions can I take?  Wear shoes that: ? Have a low heel. Do not wear high heels. ? Have rubber bottoms. ? Feel good on your feet and fit well. ? Are closed at the toe. Do not wear open-toe sandals.  Use tools that help you move around if needed. These include: ? Canes. ? Walkers. ? Scooters. ? Crutches.  Review your medicines with your doctor. Some medicines can make you feel dizzy. This can increase your chance  of falling. Ask your doctor what else you can do to help prevent falls. Where to find more information  Centers for Disease Control and Prevention, [employer]: www.cdc.gov  National Institute on Aging: www.nia.nih.gov Contact a doctor if:  You are afraid of falling at home.  You feel weak, drowsy, or dizzy at home.  You fall at home. Summary  There are many simple things that you can do to make your home safe and to help prevent falls.  Ways to make your home safe include removing things that can make you trip and installing grab bars in the bathroom.  Ask for help when making these changes in your home. This information is not intended to replace advice given to you by your health care provider. Make sure you discuss any questions you have with your health care provider. Document Revised: 02/29/2020 Document Reviewed: 02/29/2020 Elsevier Patient Education  2021 [employer]  

## 2020-11-28 NOTE — Progress Notes (Signed)
Subjective:    Patient ID: Brooke Chavez, female    DOB: May 15, 1941, 80 y.o.   MRN: [id number]   Chief Complaint: medical management of chronic issues      HPI:  1. Essential hypertension No c/o chest pain, sob or headache. Does not check blood pressure at home. BP Readings from Last 3 Encounters:  11/06/20 (!) 142/57  08/29/20 126/68  08/14/20 (!) 156/46     2. Chronic diastolic congestive heart failure ([place]) Last saw cardiology on 08/29/20.  3. Atrial flutter ([place]) Again saw cardiology on 08/29/20. According to office note , the only change =made was eliquis dose was decreased.  4. Hyperlipidemia with target LDL less than 100 family tries to help her watch her diet. She is not able to do any exercise. Lab Results  Component Value Date   CHOL 182 08/06/2020   HDL 55 08/06/2020   LDLCALC 107 (H) 08/06/2020   TRIG 111 08/06/2020   CHOLHDL 3.3 08/06/2020     5. Gastroesophageal reflux disease without esophagitis No c/o reflux symptoms  6. Dysphagia, post-stroke Is doing better  7. Hemiparesis of right dominant side as late effect of cerebral infarction [facility]) Has right sided weakness form previous stroke. She gets around pretty good with some assistance.  8. Stage 3a chronic kidney disease (HCC) No problems voiding  9. PBA (pseudobulbar affect) Cries a lot but does not want to go on any meds at this time  10. 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 Eventually is able to get out what she needs to.  11. Age-related osteoporosis without current pathological fracture Family really does not want her to do naymore dexascan at this time.  12. BMI 30.0-30.9,adult No recent weight changes  Wt Readings from Last 3 Encounters:  11/28/20 164 lb (74.4 kg)  11/06/20 167 lb (75.8 kg)  08/29/20 171 lb 3.2 oz (77.7 kg)   BMI Readings from Last 3 Encounters:  11/28/20 34.28 kg/m   11/06/20 34.90 kg/m  08/29/20 35.78 kg/m       Outpatient Encounter Medications as of 11/28/2020  Medication Sig  . acetaminophen (TYLENOL) 325 MG tablet Take 2 tablets (650 mg total) by mouth every 6 (six) hours as needed for mild pain (or Fever >/= 101).  Marland Kitchen atorvastatin (LIPITOR) 80 MG tablet Take 1 tablet (80 mg total) by mouth every evening.  Marland Kitchen ELIQUIS 5 MG TABS tablet Take 1 tablet by mouth twice daily  . escitalopram (LEXAPRO) 10 MG tablet Take 1 tablet (10 mg total) by mouth daily.  . furosemide (LASIX) 40 MG tablet Take 1 tablet by mouth once daily  . losartan (COZAAR) 100 MG tablet TAKE 1 TABLET BY MOUTH EVERY DAY  . traZODone (DESYREL) 50 MG tablet Take 1 tablet (50 mg total) by mouth at bedtime.   No facility-administered encounter medications on file as of 11/28/2020.    Past Surgical History:  Procedure Laterality Date  . A FLUTTER ABLATION N/A 06/27/2011   Procedure: ABLATION A FLUTTER;  Surgeon: Thompson Grayer, MD;  Location: [facility] C[facility] LAB;  Service: Cardiovascular;  Laterality: N/A;  . ATRIAL ABLATION SURGERY  06/2010  . HIP SURGERY     Left (fracture) 3/13  . JOINT REPLACEMENT     both knees   . KNEE ARTHROSCOPY     Right  . LUMBAR SPINE SURGERY    . MITRAL VALVE REPAIR Right 01/07/2018   Procedure: MINIMALLY INVASIVE MITRAL VALVE REPAIR  using LivaNova ring size 28 MM;  Surgeon: Rexene Alberts, MD;  Location: [place];  Service: Open Heart Surgery;  Laterality: Right;  . PATENT FORAMEN OVALE(PFO) CLOSURE N/A 01/07/2018   Procedure: PATENT FORAMEN OVALE (PFO) CLOSURE;  Surgeon: Rexene Alberts, MD;  Location: [place];  Service: Open Heart Surgery;  Laterality: N/A;  . RIGHT/LEFT HEART CATH AND CORONARY ANGIOGRAPHY N/A 11/11/2017   Procedure: RIGHT/LEFT HEART CATH AND CORONARY ANGIOGRAPHY;  Surgeon: Sherren Mocha, MD;  Location: [place] CV LAB;  Service: Cardiovascular;  Laterality: N/A;  . TEE WITHOUT CARDIOVERSION N/A 09/18/2017   Procedure: TRANSESOPHAGEAL  ECHOCARDIOGRAM (TEE);  Surgeon: Skeet Latch, MD;  Location: [place];  Service: Cardiovascular;  Laterality: N/A;  . TEE WITHOUT CARDIOVERSION N/A 01/07/2018   Procedure: TRANSESOPHAGEAL ECHOCARDIOGRAM (TEE);  Surgeon: Rexene Alberts, MD;  Location: [place];  Service: Open Heart Surgery;  Laterality: N/A;  . TOTAL KNEE ARTHROPLASTY     Left  . TUBAL LIGATION      Family History  Problem Relation Age of Onset  . Hypertension Mother   . Cancer Sister        leukemia  . Diabetes Brother   . Stroke Sister   . Diabetes Brother   . Heart attack Brother   . Healthy Daughter   . Healthy Son   . Healthy Daughter     New complaints: Patient was seen on 11/06/20 for anxiety. We started her on lexapro.has helped some. Still gets upset at times and cries frequently  Social history: *she lives with her daughter  Controlled substance contract: n/a    Review of Systems  Constitutional: Negative for diaphoresis.  Eyes: Negative for pain.  Respiratory: Negative for shortness of breath.   Cardiovascular: Negative for chest pain, palpitations and leg swelling.  Gastrointestinal: Negative for abdominal pain.  Endocrine: Negative for polydipsia.  Skin: Negative for rash.  Neurological: Negative for dizziness, weakness and headaches.  Hematological: Does not bruise/bleed easily.  All other systems reviewed and are negative.      Objective:   Physical Exam Vitals and nursing note reviewed.  Constitutional:      General: She is not in acute distress.    Appearance: Normal appearance. She is well-developed.  HENT:     Head: Normocephalic.     Nose: Nose normal.  Eyes:     Pupils: Pupils are equal, round, and reactive to light.  Neck:     Vascular: No carotid bruit or JVD.  Cardiovascular:     Rate and Rhythm: Normal rate and regular rhythm.     Heart sounds: Normal heart sounds.  Pulmonary:     Effort: Pulmonary effort is normal. No respiratory distress.     Breath  sounds: Normal breath sounds. No wheezing or rales.  Chest:     Chest wall: No tenderness.  Abdominal:     General: Bowel sounds are normal. There is no distension or abdominal bruit.     Palpations: Abdomen is soft. There is no hepatomegaly, splenomegaly, mass or pulsatile mass.     Tenderness: There is no abdominal tenderness.  Musculoskeletal:        General: Normal range of motion.     Cervical back: Normal range of motion and neck supple.     Comments: Right sided weakness- using a cane to walk  Lymphadenopathy:     Cervical: No cervical adenopathy.  Skin:    General: Skin is warm and dry.  Neurological:     Mental  Status: She is alert and oriented to person, place, and time.     Deep Tendon Reflexes: Reflexes are normal and symmetric.  Psychiatric:        Behavior: Behavior normal.        Thought Content: Thought content normal.        Judgment: Judgment normal.     BP (!) 163/62   Pulse 70   Temp 97.9 F (36.6 C) (Temporal)   Resp 20   Ht 4\' 10"  (1.473 m)   Wt 164 lb (74.4 kg)   LMP 11/05/1992   SpO2 95%   BMI 34.28 kg/m       Assessment & Plan:  Brooke Chavez comes in today with chief complaint of Medical Management of Chronic Issues   Diagnosis and orders addressed:  1. Essential hypertension Low sodium diet  2. Chronic diastolic congestive heart failure ([place]) Keep follow up  3. Atypical atrial flutter ([place]) Keep follow up with cardiology  4. Hyperlipidemia with target LDL less than 100 Low fat diet  5. Gastroesophageal reflux disease without esophagitis Avoid spicy foods Do not eat 2 hours prior to bedtime  6. Dysphagia, post-stroke Chew food good  7. 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 Stage 3a chronic kidney disease ([place]) Labs pending  9. PBA (pseudobulbar affect) Discussed nudexta  10. 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 Age-related osteoporosis without current pathological fracture Cant do weight bearing exercises  12. BMI 30.0-30.9,adult Discussed diet and exercise for person with BMI >25 Will recheck weight in 3-6 months   Labs pending Health Maintenance reviewed Diet and exercise encouraged  Follow up plan: 6 months   Mary-Margaret Hassell Done, FNP

## 2020-12-08 ENCOUNTER — Other Ambulatory Visit: Payer: Self-pay | Admitting: Nurse Practitioner

## 2020-12-08 DIAGNOSIS — F5101 Primary insomnia: Secondary | ICD-10-CM

## 2020-12-12 ENCOUNTER — Encounter (HOSPITAL_COMMUNITY): Payer: Self-pay

## 2020-12-12 ENCOUNTER — Other Ambulatory Visit: Payer: Self-pay

## 2020-12-12 DIAGNOSIS — I130 Hypertensive heart and chronic kidney disease with heart failure and stage 1 through stage 4 chronic kidney disease, or unspecified chronic kidney disease: Secondary | ICD-10-CM | POA: Diagnosis not present

## 2020-12-12 DIAGNOSIS — W19XXXA Unspecified fall, initial encounter: Secondary | ICD-10-CM | POA: Insufficient documentation

## 2020-12-12 DIAGNOSIS — M25561 Pain in right knee: Secondary | ICD-10-CM | POA: Insufficient documentation

## 2020-12-12 DIAGNOSIS — I5032 Chronic diastolic (congestive) heart failure: Secondary | ICD-10-CM | POA: Diagnosis not present

## 2020-12-12 DIAGNOSIS — Z79899 Other long term (current) drug therapy: Secondary | ICD-10-CM | POA: Diagnosis not present

## 2020-12-12 DIAGNOSIS — Z96653 Presence of artificial knee joint, bilateral: Secondary | ICD-10-CM | POA: Diagnosis not present

## 2020-12-12 DIAGNOSIS — Z7901 Long term (current) use of anticoagulants: Secondary | ICD-10-CM | POA: Insufficient documentation

## 2020-12-12 DIAGNOSIS — N183 Chronic kidney disease, stage 3 unspecified: Secondary | ICD-10-CM | POA: Diagnosis not present

## 2020-12-12 DIAGNOSIS — Z043 Encounter for examination and observation following other accident: Secondary | ICD-10-CM | POA: Diagnosis not present

## 2020-12-12 NOTE — ED Triage Notes (Signed)
Pt to er, daughter with pt, states that pt has a hx of expressive aphasia, states that they are here because earlier today pt's knee gave out and she fell, states that no pt is not putting any weight on her R leg.

## 2020-12-13 ENCOUNTER — Emergency Department (HOSPITAL_COMMUNITY)
Admission: EM | Admit: 2020-12-13 | Discharge: 2020-12-13 | Disposition: A | Discharge status: Home / Self Care | Payer: Medicare Other | Attending: Emergency Medicine | Admitting: Emergency Medicine

## 2020-12-13 ENCOUNTER — Emergency Department (HOSPITAL_COMMUNITY): Payer: Medicare Other

## 2020-12-13 DIAGNOSIS — Z043 Encounter for examination and observation following other accident: Secondary | ICD-10-CM | POA: Diagnosis not present

## 2020-12-13 DIAGNOSIS — M25561 Pain in right knee: Secondary | ICD-10-CM | POA: Diagnosis not present

## 2020-12-13 DIAGNOSIS — W19XXXA Unspecified fall, initial encounter: Secondary | ICD-10-CM

## 2020-12-13 MED ORDER — ACETAMINOPHEN 500 MG PO TABS
1000.0000 mg | ORAL_TABLET | Freq: Once | ORAL | Status: AC
  Administered 2020-12-13: 1000 mg via ORAL
  Filled 2020-12-13: qty 2

## 2020-12-13 NOTE — ED Provider Notes (Signed)
[facility] EMERGENCY DEPARTMENT Provider Note   CSN: [id number] Arrival date & time: 12/12/20  2237     History Chief Complaint  Patient presents with  . Fall         Brooke Chavez is a 80 y.o. female.  Had a mechanical fall. Here with persistent right leg pain. Seems to be around the knee. Apparently some difficulty with ambulating at home.    Fall This is a new problem. The current episode started 3 to 5 hours ago. The problem occurs constantly. The problem has not changed since onset.Pertinent negatives include no chest pain, no abdominal pain, no headaches and no shortness of breath. Nothing aggravates the symptoms. Nothing relieves the symptoms. She has tried nothing for the symptoms. The treatment provided no relief.       Past Medical History:  Diagnosis Date  . Atrial flutter ([place])    Typical by EKG diagnosis 9/11 s/p CIT ablation 11/11  . Bradycardia   . Chronic diastolic congestive heart failure ([place])   . Combined receptive and expressive aphasia due to acute stroke ([place])   . DJD (degenerative joint disease)   . Dysrhythmia   . Femur fracture, left ([place]) 2013  . Global aphasia   . Hyperlipidemia    x5 years  . Hypertension    Since 1997  . 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 Liver masses 11/13/2017   Multiple small nodules seen on CT and MRI  . Mitral regurgitation    severe  . 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 Stroke ([place])   . TR (tricuspid regurgitation)    Mild with RA enlargment    Patient Active Problem List   Diagnosis Date Noted  . Aortoiliac occlusive disease ([place]) 02/18/2019  . PBA (pseudobulbar affect) 07/19/2018  . Junctional bradycardia   . H/O mitral valve repair   .  Stage 3 chronic kidney disease ([place])   . Hypoalbuminemia due to protein-calorie malnutrition ([place])   . 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 Gait disturbance, post-stroke   . Dysphagia, post-stroke   . Advance care planning   . Acute ischemic left MCA stroke ([place]) 01/24/2018  . 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 Chronic diastolic congestive heart failure ([place])   . BMI 30.0-30.9,adult 07/24/2015  . Osteoporosis 09/19/2014  . Hip fracture requiring operative repair ([place]) 11/13/2011  . GERD (gastroesophageal reflux disease) 07/29/2011  . PAF (paroxysmal atrial fibrillation) ([place]) 05/29/2010  . Bradycardia 01/09/2009  . Hyperlipidemia with target LDL less than 100 01/04/2009  . Essential hypertension 01/04/2009    Past Surgical History:  Procedure Laterality Date  . A FLUTTER ABLATION N/A 06/27/2011   Procedure: ABLATION A FLUTTER;  Surgeon: Thompson Grayer, MD;  Location: [facility] CATH LAB;  Service: Cardiovascular;  Laterality: N/A;  . ATRIAL ABLATION SURGERY  06/2010  . HIP SURGERY     Left (fracture) 3/13  . JOINT REPLACEMENT     both knees   . KNEE ARTHROSCOPY     Right  . LUMBAR SPINE SURGERY    . MITRAL VALVE REPAIR Right 01/07/2018  Procedure: MINIMALLY INVASIVE MITRAL VALVE REPAIR using LivaNova ring size 28 MM;  Surgeon: Rexene Alberts, MD;  Location: [place];  Service: Open Heart Surgery;  Laterality: Right;  . PATENT FORAMEN OVALE(PFO) CLOSURE N/A 01/07/2018   Procedure: PATENT FORAMEN OVALE (PFO) CLOSURE;  Surgeon: Rexene Alberts, MD;  Location: [place];  Service: Open Heart Surgery;  Laterality: N/A;  . RIGHT/LEFT HEART CATH AND CORONARY ANGIOGRAPHY N/A 11/11/2017   Procedure: RIGHT/LEFT HEART CATH AND CORONARY ANGIOGRAPHY;  Surgeon: Sherren Mocha, MD;  Location: [place] CV LAB;  Service: Cardiovascular;  Laterality:  N/A;  . TEE WITHOUT CARDIOVERSION N/A 09/18/2017   Procedure: TRANSESOPHAGEAL ECHOCARDIOGRAM (TEE);  Surgeon: Skeet Latch, MD;  Location: [place];  Service: Cardiovascular;  Laterality: N/A;  . TEE WITHOUT CARDIOVERSION N/A 01/07/2018   Procedure: TRANSESOPHAGEAL ECHOCARDIOGRAM (TEE);  Surgeon: Rexene Alberts, MD;  Location: [place];  Service: Open Heart Surgery;  Laterality: N/A;  . TOTAL KNEE ARTHROPLASTY     Left  . TUBAL LIGATION       OB History   No obstetric history on file.     Family History  Problem Relation Age of Onset  . Hypertension Mother   . Cancer Sister        leukemia  . Diabetes Brother   . Stroke Sister   . Diabetes Brother   . Heart attack Brother   . Healthy Daughter   . Healthy Son   . Healthy Daughter     Social History   Tobacco Use  . Smoking status: Never Smoker  . Smokeless tobacco: Never Used  Vaping Use  . Vaping Use: Never used  Substance Use Topics  . Alcohol use: No  . Drug use: No    Home Medications Prior to Admission medications   Medication Sig Start Date End Date Taking? Authorizing Provider  acetaminophen (TYLENOL) 325 MG tablet Take 2 tablets (650 mg total) by mouth every 6 (six) hours as needed for mild pain (or Fever >/= 101). 02/12/18   Angiulli, Lavon Paganini, PA-C  atorvastatin (LIPITOR) 80 MG tablet Take 1 tablet (80 mg total) by mouth every evening. 08/09/20   Garvin Fila, MD  ELIQUIS 5 MG TABS tablet Take 1 tablet by mouth twice daily 08/14/20   Hassell Done, Mary-Margaret, FNP  escitalopram (LEXAPRO) 10 MG tablet Take 1 tablet (10 mg total) by mouth daily. 11/06/20   [name], FNP  furosemide (LASIX) 40 MG tablet Take 1 tablet by mouth once daily 09/24/20   [name], FNP  losartan (COZAAR) 100 MG tablet TAKE 1 TABLET BY MOUTH EVERY DAY 11/15/20   [name], FNP  traZODone (DESYREL) 50 MG tablet TAKE 1 TABLET BY MOUTH AT BEDTIME 12/10/20   [name], FNP    Allergies     Oxycodone-acetaminophen and Aspirin  Review of Systems   Review of Systems  Respiratory: Negative for shortness of breath.   Cardiovascular: Negative for chest pain.  Gastrointestinal: Negative for abdominal pain.  Neurological: Negative for headaches.  All other systems reviewed and are negative.   Physical Exam Updated Vital Signs BP (!) 154/55 (BP Location: Right Arm)   Pulse 60   Temp 98.8 F (37.1 C) (Oral)   Resp 17   Ht 5\' 3"  (1.6 m)   Wt 68 kg   LMP 11/05/1992   SpO2 97%   BMI 26.57 kg/m   Physical Exam Vitals and nursing note reviewed.  Constitutional:      Appearance: She is well-developed.  HENT:     Head: Normocephalic and atraumatic.     Mouth/Throat:     Mouth: Mucous membranes are moist.     Pharynx: Oropharynx is clear.  Eyes:     Pupils: Pupils are equal, round, and reactive to light.  Cardiovascular:     Rate and Rhythm: Normal rate and regular rhythm.  Pulmonary:     Effort: No respiratory distress.     Breath sounds: No stridor.  Abdominal:     General: Abdomen is flat. There is no distension.  Musculoskeletal:        General: Swelling (right knee ) and tenderness present. Normal range of motion.     Cervical back: Normal range of motion.  Skin:    General: Skin is warm and dry.     Findings: No erythema.  Neurological:     General: No focal deficit present.     Mental Status: She is alert.  Psychiatric:        Mood and Affect: Mood normal.     ED Results / Procedures / Treatments   Labs (all labs ordered are listed, but only abnormal results are displayed) Labs Reviewed - No data to display  EKG None  Radiology DG Femur Min 2 Views Right  Result Date: 12/13/2020 CLINICAL DATA:  Fall EXAM: RIGHT FEMUR 2 VIEWS COMPARISON:  None. FINDINGS: There is no evidence of fracture or other focal bone lesions. Soft tissues are unremarkable. IMPRESSION: Negative. Electronically Signed   By: Ulyses Jarred M.D.   On: 12/13/2020 02:06     Procedures Procedures   Medications Ordered in ED Medications  acetaminophen (TYLENOL) tablet 1,000 mg (1,000 mg Oral Given 12/13/20 0303)    ED Course  I have reviewed the triage vital signs and the nursing notes.  Pertinent labs & imaging results that were available during my care of the patient were reviewed by me and considered in my medical decision making (see chart for details).    MDM Rules/Calculators/A&P                          With an Ace wrap, Tylenol and ice the patient had improvement in the pain.  Patient is able to ambulate with a walker.  Felt to be stable for discharge with the care that she has at home.  She will follow-up with her doctors in few days if her pain is not improving however I think is probably related to the fusion she has.  No obvious fractures on x-ray.  May need MRI in the future however at this time stable for discharge with family members. Final Clinical Impression(s) / ED Diagnoses Final diagnoses:  Fall, initial encounter  Acute pain of right knee    Rx / DC Orders ED Discharge Orders    None       Fuquan Wilson, Corene Cornea, MD 12/13/20 [phone]

## 2020-12-15 ENCOUNTER — Other Ambulatory Visit: Payer: Self-pay | Admitting: Nurse Practitioner

## 2020-12-15 DIAGNOSIS — R609 Edema, unspecified: Secondary | ICD-10-CM

## 2020-12-17 ENCOUNTER — Telehealth: Payer: Self-pay | Admitting: *Deleted

## 2020-12-17 NOTE — Telephone Encounter (Signed)
Per Dr.Sherrill, called pt daughter Marlis Edelson with message below. Pt daughter verbalized understanding. Advised to call office with any concerns

## 2020-12-17 NOTE — Telephone Encounter (Signed)
-----   Message from Ladell Pier, MD sent at 12/14/2020  4:11 PM EDT ----- Please call daughter, CT shows no evidence of progressive cancer, follow-up as scheduled

## 2020-12-21 ENCOUNTER — Encounter: Payer: Self-pay | Admitting: Nurse Practitioner

## 2020-12-21 ENCOUNTER — Other Ambulatory Visit: Payer: Self-pay

## 2020-12-21 ENCOUNTER — Ambulatory Visit (INDEPENDENT_AMBULATORY_CARE_PROVIDER_SITE_OTHER): Payer: Medicare Other | Admitting: Nurse Practitioner

## 2020-12-21 VITALS — BP 138/51 | HR 43 | Temp 97.1°F | Resp 20 | Ht 63.0 in | Wt 165.0 lb

## 2020-12-21 DIAGNOSIS — L2084 Intrinsic (allergic) eczema: Secondary | ICD-10-CM

## 2020-12-21 DIAGNOSIS — N184 Chronic kidney disease, stage 4 (severe): Secondary | ICD-10-CM | POA: Diagnosis not present

## 2020-12-21 DIAGNOSIS — R2681 Unsteadiness on feet: Secondary | ICD-10-CM

## 2020-12-21 MED ORDER — TRIAMCINOLONE ACETONIDE 0.1 % EX OINT: TOPICAL_OINTMENT | Freq: Two times a day (BID) | CUTANEOUS | 0 refills | Status: DC

## 2020-12-21 MED ORDER — DICLOFENAC SODIUM 1 % EX GEL: 4.0000 g | Freq: Four times a day (QID) | CUTANEOUS | 1 refills | Status: DC

## 2020-12-21 NOTE — Patient Instructions (Signed)
Fall Prevention in the Home, Adult Falls can cause injuries and can happen to people of all ages. There are many things you can do to make your home safe and to help prevent falls. Ask for help when making these changes. What actions can I take to prevent falls? General Instructions  Use good lighting in all rooms. Replace any light bulbs that burn out.  Turn on the lights in dark areas. Use night-lights.  Keep items that you use often in easy-to-reach places. Lower the shelves around your home if needed.  Set up your furniture so you have a clear path. Avoid moving your furniture around.  Do not have throw rugs or other things on the floor that can make you trip.  Avoid walking on wet floors.  If any of your floors are uneven, fix them.  Add color or contrast paint or tape to clearly mark and help you see: ? Grab bars or handrails. ? First and last steps of staircases. ? Where the edge of each step is.  If you use a stepladder: ? Make sure that it is fully opened. Do not climb a closed stepladder. ? Make sure the sides of the stepladder are locked in place. ? Ask someone to hold the stepladder while you use it.  Know where your pets are when moving through your home. What can I do in the bathroom?  Keep the floor dry. Clean up any water on the floor right away.  Remove soap buildup in the tub or shower.  Use nonskid mats or decals on the floor of the tub or shower.  Attach bath mats securely with double-sided, nonslip rug tape.  If you need to sit down in the shower, use a plastic, nonslip stool.  Install grab bars by the toilet and in the tub and shower. Do not use towel bars as grab bars.      What can I do in the bedroom?  Make sure that you have a light by your bed that is easy to reach.  Do not use any sheets or blankets for your bed that hang to the floor.  Have a firm chair with side arms that you can use for support when you get dressed. What can I do in  the kitchen?  Clean up any spills right away.  If you need to reach something above you, use a step stool with a grab bar.  Keep electrical cords out of the way.  Do not use floor polish or wax that makes floors slippery. What can I do with my stairs?  Do not leave any items on the stairs.  Make sure that you have a light switch at the top and the bottom of the stairs.  Make sure that there are handrails on both sides of the stairs. Fix handrails that are broken or loose.  Install nonslip stair treads on all your stairs.  Avoid having throw rugs at the top or bottom of the stairs.  Choose a carpet that does not hide the edge of the steps on the stairs.  Check carpeting to make sure that it is firmly attached to the stairs. Fix carpet that is loose or worn. What can I do on the outside of my home?  Use bright outdoor lighting.  Fix the edges of walkways and driveways and fix any cracks.  Remove anything that might make you trip as you walk through a door, such as a raised step or threshold.  Trim any   bushes or trees on paths to your home.  Check to see if handrails are loose or broken and that both sides of all steps have handrails.  Install guardrails along the edges of any raised decks and porches.  Clear paths of anything that can make you trip, such as tools or rocks.  Have leaves, snow, or ice cleared regularly.  Use sand or salt on paths during winter.  Clean up any spills in your garage right away. This includes grease or oil spills. What other actions can I take?  Wear shoes that: ? Have a low heel. Do not wear high heels. ? Have rubber bottoms. ? Feel good on your feet and fit well. ? Are closed at the toe. Do not wear open-toe sandals.  Use tools that help you move around if needed. These include: ? Canes. ? Walkers. ? Scooters. ? Crutches.  Review your medicines with your doctor. Some medicines can make you feel dizzy. This can increase your chance  of falling. Ask your doctor what else you can do to help prevent falls. Where to find more information  Centers for Disease Control and Prevention, [employer]: www.cdc.gov  National Institute on Aging: www.nia.nih.gov Contact a doctor if:  You are afraid of falling at home.  You feel weak, drowsy, or dizzy at home.  You fall at home. Summary  There are many simple things that you can do to make your home safe and to help prevent falls.  Ways to make your home safe include removing things that can make you trip and installing grab bars in the bathroom.  Ask for help when making these changes in your home. This information is not intended to replace advice given to you by your health care provider. Make sure you discuss any questions you have with your health care provider. Document Revised: 02/29/2020 Document Reviewed: 02/29/2020 Elsevier Patient Education  2021 [employer]  

## 2020-12-21 NOTE — Progress Notes (Signed)
Subjective:    Patient ID: Brooke Chavez, female    DOB: 1941/01/24, 80 y.o.   MRN: [id number]   Chief Complaint: Hospitalization Follow-up   HPI Patient was  taken to the hospital. She had a slow fall stepping in to the house. Her right keg gave away with her and she eased down into the floor. The next day her right knee was swollen and she was having trouble walking. No fractures on xray. They have been icing it and elevating it. She is now back to walking but her knee is still bothering her some. She has had a knee replacement in that side in the past. Rash on neck for 3 days Needs walker with seat on it to go out in public- patient not able to walk  Without walker.  Review of Systems  Constitutional: Negative for diaphoresis.  Eyes: Negative for pain.  Respiratory: Negative for shortness of breath.   Cardiovascular: Negative for chest pain, palpitations and leg swelling.  Gastrointestinal: Negative for abdominal pain.  Endocrine: Negative for polydipsia.  Skin: Negative for rash.  Neurological: Negative for dizziness, weakness and headaches.  Hematological: Does not bruise/bleed easily.  All other systems reviewed and are negative.      Objective:   Physical Exam Vitals and nursing note reviewed.  Constitutional:      Appearance: Normal appearance.  Cardiovascular:     Rate and Rhythm: Normal rate and regular rhythm.     Heart sounds: Normal heart sounds.  Pulmonary:     Effort: Pulmonary effort is normal.     Breath sounds: Normal breath sounds.  Musculoskeletal:     Comments: Mild right knee effusion FROM with pain on full flexion.  Skin:    General: Skin is warm.     Comments: Erythematous macular lesions in a semicircular pattern on posterior neck  Neurological:     General: No focal deficit present.     Mental Status: She is alert and oriented to person, place, and time.  Psychiatric:        Mood and Affect: Mood normal.        Behavior: Behavior normal.     BP (!) 138/51   Pulse (!) 43   Temp (!) 97.1 F (36.2 C) (Temporal)   Resp 20   Ht _0  (1.6 m)   Wt 165 lb (74.8 kg)   LMP 11/05/1992   SpO2 100%   BMI 29.23 kg/m         Assessment & Plan:  Brooke Chavez in today with chief complaint of Hospitalization Follow-up   1. Chronic kidney disease, stage 4 (severe) (HCC) Continue to ice BID Extra strength tylenol OTC for now due to kidney function. If has improved may due something else  - CMP14+EGFR  Meds ordered this encounter  Medications  . diclofenac Sodium (VOLTAREN) 1 % GEL    Sig: Apply 4 g topically 4 (four) times daily.    Dispense:  350 g    Refill:  1    Order Specific Question:   Supervising Provider    Answer:   Caryl Pina A [[id number]]   2. Atopic dermatitis Avoid scratching Triamcinolone/eucerin cream BID  3. Unsteady gait Walker with seat   The above assessment and management plan was discussed with the patient. The patient verbalized understanding of and has agreed to the management plan. Patient is aware to call the clinic if symptoms persist or worsen. Patient is aware when to return to the  clinic for a follow-up visit. Patient educated on when it is appropriate to go to the emergency department.   Mary-Margaret Hassell Done, FNP

## 2021-01-16 ENCOUNTER — Ambulatory Visit (HOSPITAL_COMMUNITY): Payer: Medicare Other | Attending: Cardiovascular Disease

## 2021-01-16 ENCOUNTER — Other Ambulatory Visit: Payer: Self-pay

## 2021-01-16 DIAGNOSIS — Z9889 Other specified postprocedural states: Secondary | ICD-10-CM | POA: Diagnosis not present

## 2021-01-16 DIAGNOSIS — Z954 Presence of other heart-valve replacement: Secondary | ICD-10-CM | POA: Diagnosis not present

## 2021-01-16 DIAGNOSIS — I483 Typical atrial flutter: Secondary | ICD-10-CM | POA: Diagnosis not present

## 2021-01-16 LAB — ECHOCARDIOGRAM COMPLETE
Area-P 1/2: 2.27 cm2
MV M vel: 5.3 m/s
MV Peak grad: 112.5 mmHg
MV VTI: 1.04 cm2
P 1/2 time: 135.5 msec
S' Lateral: 3.3 cm

## 2021-02-08 ENCOUNTER — Other Ambulatory Visit: Payer: Self-pay | Admitting: Neurology

## 2021-02-08 ENCOUNTER — Other Ambulatory Visit: Payer: Self-pay | Admitting: Nurse Practitioner

## 2021-02-08 DIAGNOSIS — I10 Essential (primary) hypertension: Secondary | ICD-10-CM

## 2021-02-08 DIAGNOSIS — I480 Paroxysmal atrial fibrillation: Secondary | ICD-10-CM

## 2021-02-26 DIAGNOSIS — Z20822 Contact with and (suspected) exposure to covid-19: Secondary | ICD-10-CM | POA: Diagnosis not present

## 2021-04-10 ENCOUNTER — Telehealth: Payer: Self-pay | Admitting: Nurse Practitioner

## 2021-04-10 NOTE — Telephone Encounter (Signed)
Technically able to take over-the-counter Imodium if needed for severe diarrhea.  However, the question is is this diarrhea occurring because there is an acute illness like food poisoning, viral illness or is it because they have diarrhea going around chronic constipation.  Imodium certainly would prolong infection if there is an infection and worsen constipation if this is diarrhea around constipation.  Clinical correlation is needed.  Additionally, would consider holding the Lasix if patient is having severe diarrhea as they increased risk of dehydration and further kidney injury.  Patient has CKD 4.  Recommend pushing oral fluids and certainly recommend in office evaluation if symptoms are ongoing

## 2021-04-10 NOTE — Telephone Encounter (Signed)
Discussed all options with daughter - does not think it is related to infection etc. Will try the immodium and aware of lasix recommendation.  She will call if s/s worsen.

## 2021-05-04 ENCOUNTER — Other Ambulatory Visit: Payer: Self-pay | Admitting: Nurse Practitioner

## 2021-05-04 DIAGNOSIS — I480 Paroxysmal atrial fibrillation: Secondary | ICD-10-CM

## 2021-05-04 DIAGNOSIS — F5101 Primary insomnia: Secondary | ICD-10-CM

## 2021-06-11 ENCOUNTER — Other Ambulatory Visit: Payer: Self-pay | Admitting: Neurology

## 2021-07-19 ENCOUNTER — Other Ambulatory Visit: Payer: Self-pay | Admitting: Nurse Practitioner

## 2021-07-19 DIAGNOSIS — I10 Essential (primary) hypertension: Secondary | ICD-10-CM

## 2021-08-10 ENCOUNTER — Other Ambulatory Visit: Payer: Self-pay | Admitting: Nurse Practitioner

## 2021-08-10 DIAGNOSIS — I480 Paroxysmal atrial fibrillation: Secondary | ICD-10-CM

## 2021-08-14 ENCOUNTER — Other Ambulatory Visit: Payer: Self-pay

## 2021-08-14 ENCOUNTER — Encounter: Payer: Self-pay | Admitting: Nurse Practitioner

## 2021-08-14 ENCOUNTER — Inpatient Hospital Stay: Payer: Medicare Other | Attending: Nurse Practitioner | Admitting: Nurse Practitioner

## 2021-08-14 VITALS — BP 169/76 | HR 63 | Temp 97.8°F | Resp 18 | Ht 63.0 in | Wt 162.2 lb

## 2021-08-14 DIAGNOSIS — N289 Disorder of kidney and ureter, unspecified: Secondary | ICD-10-CM | POA: Insufficient documentation

## 2021-08-14 DIAGNOSIS — K7689 Other specified diseases of liver: Secondary | ICD-10-CM | POA: Diagnosis not present

## 2021-08-14 DIAGNOSIS — K869 Disease of pancreas, unspecified: Secondary | ICD-10-CM | POA: Insufficient documentation

## 2021-08-14 DIAGNOSIS — K8689 Other specified diseases of pancreas: Secondary | ICD-10-CM

## 2021-08-14 DIAGNOSIS — I10 Essential (primary) hypertension: Secondary | ICD-10-CM | POA: Insufficient documentation

## 2021-08-14 DIAGNOSIS — I4892 Unspecified atrial flutter: Secondary | ICD-10-CM | POA: Diagnosis not present

## 2021-08-14 NOTE — Progress Notes (Signed)
°  [place] OFFICE PROGRESS NOTE   Diagnosis: Pancreas lesion  INTERVAL HISTORY:   Brooke Chavez returns as scheduled.  She is accompanied by her daughter.  Brooke Chavez has no complaints.  No diarrhea.  No abdominal pain.  No flushing episodes.  She has a good appetite.  Objective:  Vital signs in last 24 hours:  Blood pressure (!) 169/76, pulse 63, temperature 97.8 F (36.6 C), temperature source Oral, resp. rate 18, height 5\' 3"  (1.6 m), weight 162 lb 3.2 oz (73.6 kg), last menstrual period 11/05/1992, SpO2 99 %.   She did not feel comfortable maneuvering onto the exam table.  She was examined in the chair.  Lymphatics: No palpable cervical, supraclavicular, axillary or inguinal lymph nodes. Resp: Distant breath sounds.  No respiratory distress. Cardio: Irregular. GI: Abdomen soft and nontender.  No hepatosplenomegaly.  No mass. Vascular: No leg edema.   Lab Results:  Lab Results  Component Value Date   WBC 6.3 05/14/2020   HGB 11.3 05/14/2020   HCT 35.1 05/14/2020   MCV 98 (H) 05/14/2020   PLT 229 05/14/2020   NEUTROABS 3.0 05/14/2020    Imaging:  No results found.  Medications: I have reviewed the patient's current medications.  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  Stable small hepatic cysts.  No new or enlarging liver lesions seen. 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  There is no clinical evidence to suggest development of a pancreas neoplasm.  Dr. Benay Spice recommends next noncontrast CT abdomen/pelvis at a 1 year interval.  We will contact her daughter with the final report.  She will return for follow-up here in 1 year.  We are available to see her sooner if needed.    Ned Card ANP/GNP-BC   08/14/2021  10:27 AM

## 2021-08-26 ENCOUNTER — Encounter: Payer: Self-pay | Admitting: Nurse Practitioner

## 2021-08-26 ENCOUNTER — Ambulatory Visit (INDEPENDENT_AMBULATORY_CARE_PROVIDER_SITE_OTHER): Payer: Medicare Other | Admitting: Nurse Practitioner

## 2021-08-26 VITALS — BP 162/80 | HR 68 | Temp 97.4°F | Resp 20 | Ht 63.0 in | Wt 160.0 lb

## 2021-08-26 DIAGNOSIS — I5032 Chronic diastolic (congestive) heart failure: Secondary | ICD-10-CM

## 2021-08-26 DIAGNOSIS — N1831 Chronic kidney disease, stage 3a: Secondary | ICD-10-CM | POA: Diagnosis not present

## 2021-08-26 DIAGNOSIS — I480 Paroxysmal atrial fibrillation: Secondary | ICD-10-CM | POA: Diagnosis not present

## 2021-08-26 DIAGNOSIS — E785 Hyperlipidemia, unspecified: Secondary | ICD-10-CM

## 2021-08-26 DIAGNOSIS — F5101 Primary insomnia: Secondary | ICD-10-CM

## 2021-08-26 DIAGNOSIS — R609 Edema, unspecified: Secondary | ICD-10-CM

## 2021-08-26 DIAGNOSIS — Z6830 Body mass index (BMI) 30.0-30.9, adult: Secondary | ICD-10-CM

## 2021-08-26 DIAGNOSIS — R269 Unspecified abnormalities of gait and mobility: Secondary | ICD-10-CM

## 2021-08-26 DIAGNOSIS — I10 Essential (primary) hypertension: Secondary | ICD-10-CM

## 2021-08-26 DIAGNOSIS — I69398 Other sequelae of cerebral infarction: Secondary | ICD-10-CM

## 2021-08-26 DIAGNOSIS — F482 Pseudobulbar affect: Secondary | ICD-10-CM

## 2021-08-26 DIAGNOSIS — I69351 Hemiplegia and hemiparesis following cerebral infarction affecting right dominant side: Secondary | ICD-10-CM | POA: Diagnosis not present

## 2021-08-26 DIAGNOSIS — I6930 Unspecified sequelae of cerebral infarction: Secondary | ICD-10-CM | POA: Insufficient documentation

## 2021-08-26 MED ORDER — ATORVASTATIN CALCIUM 80 MG PO TABS: 80.0000 mg | ORAL_TABLET | Freq: Every evening | ORAL | 1 refills | Status: DC

## 2021-08-26 MED ORDER — AMLODIPINE BESYLATE 5 MG PO TABS: 5.0000 mg | ORAL_TABLET | Freq: Every day | ORAL | 1 refills | Status: DC

## 2021-08-26 MED ORDER — APIXABAN 5 MG PO TABS: 5.0000 mg | ORAL_TABLET | Freq: Two times a day (BID) | ORAL | 1 refills | Status: DC

## 2021-08-26 MED ORDER — TRAZODONE HCL 50 MG PO TABS
50.0000 mg | ORAL_TABLET | Freq: Every day | ORAL | 1 refills | Status: DC
Start: 2021-08-26 — End: 2022-03-07

## 2021-08-26 MED ORDER — FUROSEMIDE 40 MG PO TABS
40.0000 mg | ORAL_TABLET | Freq: Every day | ORAL | 1 refills | Status: DC
Start: 2021-08-26 — End: 2022-03-07

## 2021-08-26 MED ORDER — LOSARTAN POTASSIUM 100 MG PO TABS: 100.0000 mg | ORAL_TABLET | Freq: Every day | ORAL | 1 refills | Status: DC

## 2021-08-26 NOTE — Patient Instructions (Signed)

## 2021-08-26 NOTE — Progress Notes (Signed)
Subjective:    Patient ID: Brooke Chavez, female    DOB: 10/28/1940, 81 y.o.   MRN: [id number]   Chief Complaint: medical management of chronic issues     HPI:  Brooke Chavez is a 81 y.o. who identifies as a female who was assigned female at birth.   Social history: Lives with: daughters Work history: retired   Scientist, forensic in today for follow up of the following chronic medical issues:  1. Essential hypertension No c/o chest pain, sob or headache. Does not check blood pressure at home. BP Readings from Last 3 Encounters:  08/14/21 (!) 169/76  12/21/20 (!) 138/51  12/13/20 (!) 154/55     2. Chronic diastolic congestive heart failure ([place]) Last saw cardiology on 08/29/20. Reviewing office note showed no change in plan of care. She was to have an echo in June 2022 which was normal  3. PAF (paroxysmal atrial fibrillation) ([place]) Had an ablation twice. Is currently on eliquis without any bleeding.   4. 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  5. Hyperlipidemia with target LDL less than 100 Eats whatever her family fixes for her. Lab Results  Component Value Date   CHOL 182 08/06/2020   HDL 55 08/06/2020   LDLCALC 107 (H) 08/06/2020   TRIG 111 08/06/2020   CHOLHDL 3.3 08/06/2020     6. Stage 3a chronic kidney disease (HCC) No problems voiding Lab Results  Component Value Date   CREATININE 1.93 (H) 05/14/2020     7. Gait disturbance, post-stroke Uses walker when needed. No recent falls  8. PBA (pseudobulbar affect) Is on no meds for this  9. Insomnia Is on trazadone to sleep at night and sleeps about 8-9 hours a night.   10. BMI 30.0-30.9,adult No recent weight changes Wt Readings from Last 3 Encounters:  08/26/21 160 lb (72.6 kg)  08/14/21 162 lb 3.2 oz (73.6 kg)  12/21/20 165 lb (74.8 kg)   BMI Readings from Last 3 Encounters:   08/26/21 28.34 kg/m  08/14/21 28.73 kg/m  12/21/20 29.23 kg/m     New complaints: None today  Allergies  Allergen Reactions   Oxycodone-Acetaminophen Anxiety and Other (See Comments)    Hallucinations   Aspirin Nausea And Vomiting and Nausea Only    Other reaction(s): GI Upset (intolerance)   Outpatient Encounter Medications as of 08/26/2021  Medication Sig   acetaminophen (TYLENOL) 325 MG tablet Take 2 tablets (650 mg total) by mouth every 6 (six) hours as needed for mild pain (or Fever >/= 101).   apixaban (ELIQUIS) 5 MG TABS tablet Take 1 tablet (5 mg total) by mouth 2 (two) times daily. Needs to be seen for any further refill   atorvastatin (LIPITOR) 80 MG tablet Take 1 tablet (80 mg total) by mouth every evening. Please call and make overdue appt for further refills   diclofenac Sodium (VOLTAREN) 1 % GEL Apply 4 g topically 4 (four) times daily.   escitalopram (LEXAPRO) 10 MG tablet Take 1 tablet (10 mg total) by mouth daily. (Patient not taking: Reported on 08/14/2021)   furosemide (LASIX) 40 MG tablet Take 1 tablet by mouth once daily   losartan (COZAAR) 100 MG tablet Take 1 tablet (100 mg total) by mouth daily. (NEEDS TO BE SEEN BEFORE NEXT REFILL)   traZODone (DESYREL) 50 MG tablet TAKE 1 TABLET BY MOUTH AT BEDTIME  triamcinolone 0.1% oint-Eucerin equivalent cream 1:1 mixture Apply topically 2 (two) times daily.   No facility-administered encounter medications on file as of 08/26/2021.    Past Surgical History:  Procedure Laterality Date   A FLUTTER ABLATION N/A 06/27/2011   Procedure: ABLATION A FLUTTER;  Surgeon: Thompson Grayer, MD;  Location: [facility] CATH LAB;  Service: Cardiovascular;  Laterality: N/A;   ATRIAL ABLATION SURGERY  06/2010   HIP SURGERY     Left (fracture) 3/13   JOINT REPLACEMENT     both knees    KNEE ARTHROSCOPY     Right   LUMBAR SPINE SURGERY     MITRAL VALVE REPAIR Right 01/07/2018   Procedure: MINIMALLY INVASIVE MITRAL VALVE REPAIR using  LivaNova ring size 28 MM;  Surgeon: Rexene Alberts, MD;  Location: [facility];  Service: Open Heart Surgery;  Laterality: Right;   PATENT FORAMEN OVALE(PFO) CLOSURE N/A 01/07/2018   Procedure: PATENT FORAMEN OVALE (PFO) CLOSURE;  Surgeon: Rexene Alberts, MD;  Location: [place];  Service: Open Heart Surgery;  Laterality: N/A;   RIGHT/LEFT HEART CATH AND CORONARY ANGIOGRAPHY N/A 11/11/2017   Procedure: RIGHT/LEFT HEART CATH AND CORONARY ANGIOGRAPHY;  Surgeon: Sherren Mocha, MD;  Location: [facility] CV LAB;  Service: Cardiovascular;  Laterality: N/A;   TEE WITHOUT CARDIOVERSION N/A 09/18/2017   Procedure: TRANSESOPHAGEAL ECHOCARDIOGRAM (TEE);  Surgeon: Skeet Latch, MD;  Location: [place];  Service: Cardiovascular;  Laterality: N/A;   TEE WITHOUT CARDIOVERSION N/A 01/07/2018   Procedure: TRANSESOPHAGEAL ECHOCARDIOGRAM (TEE);  Surgeon: Rexene Alberts, MD;  Location: [place];  Service: Open Heart Surgery;  Laterality: N/A;   TOTAL KNEE ARTHROPLASTY     Left   TUBAL LIGATION      Family History  Problem Relation Age of Onset   Hypertension Mother    Cancer Sister        leukemia   Diabetes Brother    Stroke Sister    Diabetes Brother    Heart attack Brother    Healthy Daughter    Healthy Son    Healthy Daughter       Controlled substance contract: n/a     Review of Systems  Constitutional:  Negative for diaphoresis.  Eyes:  Negative for pain.  Respiratory:  Negative for shortness of breath.   Cardiovascular:  Negative for chest pain, palpitations and leg swelling.  Gastrointestinal:  Negative for abdominal pain.  Endocrine: Negative for polydipsia.  Skin:  Negative for rash.  Neurological:  Negative for dizziness, weakness and headaches.  Hematological:  Does not bruise/bleed easily.  All other systems reviewed and are negative.     Objective:   Physical Exam Vitals and nursing note reviewed.  Constitutional:      General: She is not in acute distress.     Appearance: Normal appearance. She is well-developed.  HENT:     Head: Normocephalic.     Right Ear: Tympanic membrane normal.     Left Ear: Tympanic membrane normal.     Nose: Nose normal.     Mouth/Throat:     Mouth: Mucous membranes are moist.  Eyes:     Pupils: Pupils are equal, round, and reactive to light.  Neck:     Vascular: No carotid bruit or JVD.  Cardiovascular:     Rate and Rhythm: Normal rate and regular rhythm.     Heart sounds: Normal heart sounds.  Pulmonary:     Effort: Pulmonary effort is normal. No respiratory distress.  Breath sounds: Normal breath sounds. No wheezing or rales.  Chest:     Chest wall: No tenderness.  Abdominal:     General: Bowel sounds are normal. There is no distension or abdominal bruit.     Palpations: Abdomen is soft. There is no hepatomegaly, splenomegaly, mass or pulsatile mass.     Tenderness: There is no abdominal tenderness.  Musculoskeletal:        General: Normal range of motion.     Cervical back: Normal range of motion and neck supple.  Lymphadenopathy:     Cervical: No cervical adenopathy.  Skin:    General: Skin is warm and dry.  Neurological:     Mental Status: She is alert and oriented to person, place, and time.     Deep Tendon Reflexes: Reflexes are normal and symmetric.  Psychiatric:        Behavior: Behavior normal.        Thought Content: Thought content normal.        Judgment: Judgment normal.    BP (!) 162/80    Pulse 68    Temp (!) 97.4 F (36.3 C) (Temporal)    Resp 20    Ht 5\' 3"  (1.6 m)    Wt 160 lb (72.6 kg)    LMP 11/05/1992    SpO2 99%    BMI 28.34 kg/m        Assessment & Plan:  Brooke Chavez comes in today with chief complaint of Medical Management of Chronic Issues   Diagnosis and orders addressed:  1. Essential hypertension Low sodium diet - losartan (COZAAR) 100 MG tablet; Take 1 tablet (100 mg total) by mouth daily. (NEEDS TO BE SEEN BEFORE NEXT REFILL)  Dispense: 90 tablet; Refill:  1  2. Chronic diastolic congestive heart failure ([place]) Keep yearly follow up with cardiology  3. PAF (paroxysmal atrial fibrillation) (HCC) Avoid caffeine - apixaban (ELIQUIS) 5 MG TABS tablet; Take 1 tablet (5 mg total) by mouth 2 (two) times daily. Needs to be seen for any further refill  Dispense: 180 tablet; Refill: 1  4. 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 Hyperlipidemia with target LDL less than 100 Low fat diet - atorvastatin (LIPITOR) 80 MG tablet; Take 1 tablet (80 mg total) by mouth every evening. Please call and make overdue appt for further refills  Dispense: 90 tablet; Refill: 1  6. Stage 3a chronic kidney disease ([place]) Labs pending  7. Gait disturbance, post-stroke Fall prevention  8. PBA (pseudobulbar affect)  9. BMI 30.0-30.9,adult Discussed diet and exercise for person with BMI >25 Will recheck weight in 3-6 months   10. Peripheral edema Elevate legs when sitting - furosemide (LASIX) 40 MG tablet; Take 1 tablet (40 mg total) by mouth daily.  Dispense: 90 tablet; Refill: 1  11. Primary insomnia Bedtime routine - traZODone (DESYREL) 50 MG tablet; Take 1 tablet (50 mg total) by mouth at bedtime.  Dispense: 90 tablet; Refill: 1   Labs pending Health Maintenance reviewed Diet and exercise encouraged  Follow up plan: 6 months   Mary-Margaret Hassell Done, FNP

## 2021-08-27 LAB — CMP14+EGFR
ALT: 15 IU/L (ref 0–32)
AST: 25 IU/L (ref 0–40)
Albumin/Globulin Ratio: 1.4 (ref 1.2–2.2)
Albumin: 4.1 g/dL (ref 3.7–4.7)
Alkaline Phosphatase: 99 IU/L (ref 44–121)
BUN/Creatinine Ratio: 12 (ref 12–28)
BUN: 19 mg/dL (ref 8–27)
Bilirubin Total: 0.6 mg/dL (ref 0.0–1.2)
CO2: 27 mmol/L (ref 20–29)
Calcium: 9.7 mg/dL (ref 8.7–10.3)
Chloride: 101 mmol/L (ref 96–106)
Creatinine, Ser: 1.65 mg/dL — ABNORMAL HIGH (ref 0.57–1.00)
Globulin, Total: 3 g/dL (ref 1.5–4.5)
Glucose: 81 mg/dL (ref 70–99)
Potassium: 4.5 mmol/L (ref 3.5–5.2)
Sodium: 144 mmol/L (ref 134–144)
Total Protein: 7.1 g/dL (ref 6.0–8.5)
eGFR: 31 mL/min/{1.73_m2} — ABNORMAL LOW (ref >59)

## 2021-08-27 LAB — CBC WITH DIFFERENTIAL/PLATELET
Basophils Absolute: 0 10*3/uL (ref 0.0–0.2)
Basos: 0 %
EOS (ABSOLUTE): 0 10*3/uL (ref 0.0–0.4)
Eos: 1 %
Hematocrit: 35.1 % (ref 34.0–46.6)
Hemoglobin: 11.4 g/dL (ref 11.1–15.9)
Immature Grans (Abs): 0 10*3/uL (ref 0.0–0.1)
Immature Granulocytes: 1 %
Lymphocytes Absolute: 2.2 10*3/uL (ref 0.7–3.1)
Lymphs: 43 %
MCH: 30.5 pg (ref 26.6–33.0)
MCHC: 32.5 g/dL (ref 31.5–35.7)
MCV: 94 fL (ref 79–97)
Monocytes Absolute: 0.3 10*3/uL (ref 0.1–0.9)
Monocytes: 7 %
Neutrophils Absolute: 2.5 10*3/uL (ref 1.4–7.0)
Neutrophils: 48 %
RBC: 3.74 x10E6/uL — ABNORMAL LOW (ref 3.77–5.28)
RDW: 13.6 % (ref 11.7–15.4)
WBC: 5.1 10*3/uL (ref 3.4–10.8)

## 2021-08-27 LAB — LIPID PANEL
Chol/HDL Ratio: 3.3 ratio (ref 0.0–4.4)
Cholesterol, Total: 176 mg/dL (ref 100–199)
HDL: 53 mg/dL (ref >39)
LDL Chol Calc (NIH): 103 mg/dL — ABNORMAL HIGH (ref 0–99)
Triglycerides: 109 mg/dL (ref 0–149)
VLDL Cholesterol Cal: 20 mg/dL (ref 5–40)

## 2021-08-29 ENCOUNTER — Ambulatory Visit (INDEPENDENT_AMBULATORY_CARE_PROVIDER_SITE_OTHER): Payer: Medicare Other

## 2021-08-29 VITALS — Ht 63.0 in | Wt 160.0 lb

## 2021-08-29 DIAGNOSIS — Z0000 Encounter for general adult medical examination without abnormal findings: Secondary | ICD-10-CM | POA: Diagnosis not present

## 2021-08-29 NOTE — Patient Instructions (Signed)
[name] , Thank you for taking time to come for your Medicare Wellness Visit. I appreciate your ongoing commitment to your health goals. Please review the following plan we discussed and let me know if I can assist you in the future.   Screening recommendations/referrals: Colonoscopy: no longer required Mammogram: no longer required Bone Density: no longer required Recommended yearly ophthalmology/optometry visit for glaucoma screening and checkup Recommended yearly dental visit for hygiene and checkup  Vaccinations: Influenza vaccine: Declined Pneumococcal vaccine: Done 04/29/2011 - ask about Prevnar Tdap vaccine: done 04/29/2011 - Repeat in 10 years *due Shingles vaccine: Due   Covid-19: Done 09/02/2019, 09/23/2019, 07/06/2020  Advanced directives: in chart  Conditions/risks identified: Aim for 30 minutes of exercise or walking each day - 5-10 minutes at a time, drink 6-8 glasses of water and eat lots of fruits and vegetables.   Next appointment: Follow up in one year for your annual wellness visit    Preventive Care 65 Years and Older, Female Preventive care refers to lifestyle choices and visits with your health care provider that can promote health and wellness. What does preventive care include? A yearly physical exam. This is also called an annual well check. Dental exams once or twice a year. Routine eye exams. Ask your health care provider how often you should have your eyes checked. Personal lifestyle choices, including: Daily care of your teeth and gums. Regular physical activity. Eating a healthy diet. Avoiding tobacco and drug use. Limiting alcohol use. Practicing safe sex. Taking low-dose aspirin every day. Taking vitamin and mineral supplements as recommended by your health care provider. What happens during an annual well check? The services and screenings done by your health care provider during your annual well check will depend on your age, overall health,  lifestyle risk factors, and family history of disease. Counseling  Your health care provider may ask you questions about your: Alcohol use. Tobacco use. Drug use. Emotional well-being. Home and relationship well-being. Sexual activity. Eating habits. History of falls. Memory and ability to understand (cognition). Work and work Statistician. Reproductive health. Screening  You may have the following tests or measurements: Height, weight, and BMI. Blood pressure. Lipid and cholesterol levels. These may be checked every 5 years, or more frequently if you are over 46 years old. Skin check. Lung cancer screening. You may have this screening every year starting at age 9 if you have a 30-pack-year history of smoking and currently smoke or have quit within the past 15 years. Fecal occult blood test (FOBT) of the stool. You may have this test every year starting at age 61. Flexible sigmoidoscopy or colonoscopy. You may have a sigmoidoscopy every 5 years or a colonoscopy every 10 years starting at age 79. Hepatitis C blood test. Hepatitis B blood test. Sexually transmitted disease (STD) testing. Diabetes screening. This is done by checking your blood sugar (glucose) after you have not eaten for a while (fasting). You may have this done every 1-3 years. Bone density scan. This is done to screen for osteoporosis. You may have this done starting at age 59. Mammogram. This may be done every 1-2 years. Talk to your health care provider about how often you should have regular mammograms. Talk with your health care provider about your test results, treatment options, and if necessary, the need for more tests. Vaccines  Your health care provider may recommend certain vaccines, such as: Influenza vaccine. This is recommended every year. Tetanus, diphtheria, and acellular pertussis (Tdap, Td) vaccine. You may  need a Td booster every 10 years. Zoster vaccine. You may need this after age  77. Pneumococcal 13-valent conjugate (PCV13) vaccine. One dose is recommended after age 80. Pneumococcal polysaccharide (PPSV23) vaccine. One dose is recommended after age 30. Talk to your health care provider about which screenings and vaccines you need and how often you need them. This information is not intended to replace advice given to you by your health care provider. Make sure you discuss any questions you have with your health care provider. Document Released: 08/24/2015 Document Revised: 04/16/2016 Document Reviewed: 05/29/2015 Elsevier Interactive Patient Education  2017 [employer] Prevention in the Home Falls can cause injuries. They can happen to people of all ages. There are many things you can do to make your home safe and to help prevent falls. What can I do on the outside of my home? Regularly fix the edges of walkways and driveways and fix any cracks. Remove anything that might make you trip as you walk through a door, such as a raised step or threshold. Trim any bushes or trees on the path to your home. Use bright outdoor lighting. Clear any walking paths of anything that might make someone trip, such as rocks or tools. Regularly check to see if handrails are loose or broken. Make sure that both sides of any steps have handrails. Any raised decks and porches should have guardrails on the edges. Have any leaves, snow, or ice cleared regularly. Use sand or salt on walking paths during winter. Clean up any spills in your garage right away. This includes oil or grease spills. What can I do in the bathroom? Use night lights. Install grab bars by the toilet and in the tub and shower. Do not use towel bars as grab bars. Use non-skid mats or decals in the tub or shower. If you need to sit down in the shower, use a plastic, non-slip stool. Keep the floor dry. Clean up any water that spills on the floor as soon as it happens. Remove soap buildup in the tub or shower  regularly. Attach bath mats securely with double-sided non-slip rug tape. Do not have throw rugs and other things on the floor that can make you trip. What can I do in the bedroom? Use night lights. Make sure that you have a light by your bed that is easy to reach. Do not use any sheets or blankets that are too big for your bed. They should not hang down onto the floor. Have a firm chair that has side arms. You can use this for support while you get dressed. Do not have throw rugs and other things on the floor that can make you trip. What can I do in the kitchen? Clean up any spills right away. Avoid walking on wet floors. Keep items that you use a lot in easy-to-reach places. If you need to reach something above you, use a strong step stool that has a grab bar. Keep electrical cords out of the way. Do not use floor polish or wax that makes floors slippery. If you must use wax, use non-skid floor wax. Do not have throw rugs and other things on the floor that can make you trip. What can I do with my stairs? Do not leave any items on the stairs. Make sure that there are handrails on both sides of the stairs and use them. Fix handrails that are broken or loose. Make sure that handrails are as long as the stairways.  Check any carpeting to make sure that it is firmly attached to the stairs. Fix any carpet that is loose or worn. Avoid having throw rugs at the top or bottom of the stairs. If you do have throw rugs, attach them to the floor with carpet tape. Make sure that you have a light switch at the top of the stairs and the bottom of the stairs. If you do not have them, ask someone to add them for you. What else can I do to help prevent falls? Wear shoes that: Do not have high heels. Have rubber bottoms. Are comfortable and fit you well. Are closed at the toe. Do not wear sandals. If you use a stepladder: Make sure that it is fully opened. Do not climb a closed stepladder. Make sure that  both sides of the stepladder are locked into place. Ask someone to hold it for you, if possible. Clearly mark and make sure that you can see: Any grab bars or handrails. First and last steps. Where the edge of each step is. Use tools that help you move around (mobility aids) if they are needed. These include: Canes. Walkers. Scooters. Crutches. Turn on the lights when you go into a dark area. Replace any light bulbs as soon as they burn out. Set up your furniture so you have a clear path. Avoid moving your furniture around. If any of your floors are uneven, fix them. If there are any pets around you, be aware of where they are. Review your medicines with your doctor. Some medicines can make you feel dizzy. This can increase your chance of falling. Ask your doctor what other things that you can do to help prevent falls. This information is not intended to replace advice given to you by your health care provider. Make sure you discuss any questions you have with your health care provider. Document Released: 05/24/2009 Document Revised: 01/03/2016 Document Reviewed: 09/01/2014 Elsevier Interactive Patient Education  2017 [employer].

## 2021-08-29 NOTE — Progress Notes (Signed)
Subjective:   Brooke Chavez is a 81 y.o. female who presents for Medicare Annual (Subsequent) preventive examination.  Virtual Visit via Telephone Note  I connected with  Brooke Chavez on 08/29/21 at  4:15 PM EST by telephone and verified that I am speaking with the correct person using two identifiers.  Location: Patient: Home Provider: [facility] Persons participating in the virtual visit: patient/daughter, [place]ette Advisor   I discussed the limitations, risks, security and privacy concerns of performing an evaluation and management service by telephone and the availability of in person appointments. The patient expressed understanding and agreed to proceed.  Interactive audio and video telecommunications were attempted between this nurse and patient, however failed, due to patient having technical difficulties OR patient did not have access to video capability.  We continued and completed visit with audio only.  Some vital signs may be absent or patient reported.   Brooke Chavez E Brooke Follansbee, LPN   Review of Systems     Cardiac Risk Factors include: advanced age (>24men, >64 women);sedentary lifestyle;dyslipidemia;hypertension;Other (see comment), Risk factor comments: CHF, A.Fib., hx of CVA     Objective:    Today's Vitals   08/29/21 1600 08/29/21 1601  Weight: 160 lb (72.6 kg)   Height: 5\' 3"  (1.6 m)   PainSc:  0-No pain   Body mass index is 28.34 kg/m.  Advanced Directives 08/29/2021 08/14/2021 12/12/2020 08/14/2020 12/27/2019 03/13/2018 02/20/2018  Does Patient Have a Medical Advance Directive? Yes Yes Yes Yes Yes No No  Type of Paramedic of [place];Living will [place];Living will;Out of facility DNR (pink MOST or yellow form) Stamps;Living will [place];Living will [place];Living will - -  Does patient want to make changes to medical advance directive? - No - Patient  declined - No - Patient declined No - Patient declined - No - Patient declined  Copy of [place] in Chart? Yes - validated most recent copy scanned in chart (See row information) - - Yes - validated most recent copy scanned in chart (See row information) Yes - validated most recent copy scanned in chart (See row information) - -  Would patient like information on creating a medical advance directive? - No - Patient declined - - - - Yes (Inpatient - patient defers creating a medical advance directive at this time)    Current Medications (verified) Outpatient Encounter Medications as of 08/29/2021  Medication Sig   acetaminophen (TYLENOL) 325 MG tablet Take 2 tablets (650 mg total) by mouth every 6 (six) hours as needed for mild pain (or Fever >/= 101).   amLODipine (NORVASC) 5 MG tablet Take 1 tablet (5 mg total) by mouth daily.   apixaban (ELIQUIS) 5 MG TABS tablet Take 1 tablet (5 mg total) by mouth 2 (two) times daily. Needs to be seen for any further refill   atorvastatin (LIPITOR) 80 MG tablet Take 1 tablet (80 mg total) by mouth every evening. Please call and make overdue appt for further refills   diclofenac Sodium (VOLTAREN) 1 % GEL Apply 4 g topically 4 (four) times daily.   furosemide (LASIX) 40 MG tablet Take 1 tablet (40 mg total) by mouth daily.   losartan (COZAAR) 100 MG tablet Take 1 tablet (100 mg total) by mouth daily. (NEEDS TO BE SEEN BEFORE NEXT REFILL)   traZODone (DESYREL) 50 MG tablet Take 1 tablet (50 mg total) by mouth at bedtime.  triamcinolone 0.1% oint-Eucerin equivalent cream 1:1 mixture Apply topically 2 (two) times daily.   No facility-administered encounter medications on file as of 08/29/2021.    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   LMCA stroke post MV repair  01/24/18 (INR 1.7)   Liver masses 11/13/2017   Multiple small nodules seen on CT and MRI   Mitral regurgitation    severe   Pancreatic mass 11/13/2017   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   Complex valvuloplasty including artificial Gore-tex neochord placement x6 and 28 mm Sorin Memo 4D ring annuloplasty via right mini thoracotomy approach   Stroke ([place])    TR (tricuspid regurgitation)    Mild with RA enlargment   Past Surgical History:  Procedure Laterality Date   A FLUTTER ABLATION N/A 06/27/2011   Procedure: ABLATION A FLUTTER;  Surgeon: Thompson Grayer, MD;  [facility] C[facility] LAB;  Service: Cardiovascular;  Laterality: N/A;   ATRIAL ABLATION SURGERY  06/2010   HIP SURGERY     Left (fracture) 3/13   JOINT REPLACEMENT     both knees    KNEE ARTHROSCOPY     Right   LUMBAR SPINE SURGERY     MITRAL VALVE REPAIR Right 01/07/2018   Procedure: MINIMALLY INVASIVE MITRAL VALVE REPAIR using LivaNova ring size 28 MM;  Surgeon: Rexene Alberts, MD;  Location: [place];  Service: Open Heart Surgery;  Laterality: Right;   PATENT FORAMEN OVALE(PFO) CLOSURE N/A 01/07/2018   Procedure: PATENT FORAMEN OVALE (PFO) CLOSURE;  Surgeon: Rexene Alberts, MD;  Location: [place];  Service: Open Heart Surgery;  Laterality: N/A;   RIGHT/LEFT HEART CATH AND CORONARY ANGIOGRAPHY N/A 11/11/2017   Procedure: RIGHT/LEFT HEART CATH AND CORONARY ANGIOGRAPHY;  Surgeon: Sherren Mocha, MD;  Location: [place] CV LAB;  Service: Cardiovascular;  Laterality: N/A;   TEE WITHOUT CARDIOVERSION N/A 09/18/2017   Procedure: TRANSESOPHAGEAL ECHOCARDIOGRAM (TEE);  Surgeon: Skeet Latch, MD;   Location: [place];  Service: Cardiovascular;  Laterality: N/A;   TEE WITHOUT CARDIOVERSION N/A 01/07/2018   Procedure: TRANSESOPHAGEAL ECHOCARDIOGRAM (TEE);  Surgeon: Rexene Alberts, MD;  Location: [place];  Service: Open Heart Surgery;  Laterality: N/A;   TOTAL KNEE ARTHROPLASTY     Left   TUBAL LIGATION     Family History  Problem Relation Age of Onset   Hypertension Mother    Cancer Sister        leukemia   Diabetes Brother    Stroke Sister    Diabetes Brother    Heart attack Brother    Healthy Daughter    Healthy Son    Healthy Daughter    Social History   Socioeconomic History   Marital status: Widowed    Spouse name: Not on file   Number of children: 3   Years of education: 59   Highest education level: Not on file  Occupational History    Comment:  na  Tobacco Use   Smoking status: Never   Smokeless tobacco: Never  Vaping Use   Vaping Use: Never used  Substance and Sexual Activity   Alcohol use: No   Drug use: No   Sexual activity: Never  Other Topics Concern   Not on file  Social History Narrative   Her daughter lives with her - Dillard [place]   Patient unable to talk   Little caffeine   Social Determinants of Health   Financial Resource Strain: Medium Risk   Difficulty of Paying Living Expenses: Somewhat hard  Food Insecurity: No Food Insecurity   Worried About Charity fundraiser in the Last Year: Never true   Ran Out of Food in the Last Year: Never true  Transportation Needs: No Transportation Needs   Lack of Transportation (Medical): No   Lack of Transportation (Non-Medical): No  Physical Activity: Insufficiently Active   Days of Exercise per Week: 7 days   Minutes of Exercise per Session: 10 min  Stress: No Stress Concern Present   Feeling of Stress : Only a little  Social Connections: Moderately Integrated   Frequency of Communication with Friends and Family: More than three times a week   Frequency of Social Gatherings with Friends and  Family: More than three times a week   Attends Religious Services: 1 to 4 times per year   Active Member of [employer] or Organizations: Yes   Attends Archivist Meetings: 1 to 4 times per year   Marital Status: Widowed    Tobacco Counseling Counseling given: Not Answered   Clinical Intake:  Pre-visit preparation completed: Yes  Pain : No/denies pain Pain Score: 0-No pain     BMI - recorded: 28.34 Nutritional Status: BMI 25 -29 Overweight Nutritional Risks: None Diabetes: No  How often do you need to have someone help you when you read instructions, pamphlets, or other written materials from your doctor or pharmacy?: 1 - Never  Diabetic? no  Interpreter Needed?: No  Information entered by :: Connell Bognar, LPN   Activities of Daily Living In your present state of health, do you have any difficulty performing the following activities: 08/29/2021  Hearing? N  Vision? N  Difficulty concentrating or making decisions? Y  Walking or climbing stairs? Y  Dressing or bathing? Y  Doing errands, shopping? Y  Preparing Food and eating ? Y  Using the Toilet? N  In the past six months, have you accidently leaked urine? N  Do you have problems with loss of bowel control? N  Managing your Medications? Y  Managing your Finances? Y  Housekeeping or managing your Housekeeping? Y  Some recent data might be hidden    Patient Care Team: [name], FNP as PCP - General (Nurse Practitioner) [name], MD as PCP - Cardiology (Cardiology)  Indicate any recent Medical Services you may have received from other than [facility] providers in the past year (date may be approximate).     Assessment:   This is a routine wellness examination for [place].  Hearing/Vision screen Hearing Screening - Comments:: Denies hearing difficulties  Vision Screening - Comments:: Denies vision difficulties - no eye doctor  Dietary issues and exercise activities discussed: Current  Exercise Habits: Home exercise routine, Type of exercise: walking, Time (Minutes): 10, Frequency (Times/Week): 7, Weekly Exercise (Minutes/Week): 70, Intensity: Mild, Exercise limited by: neurologic condition(s);orthopedic condition(s)   Goals Addressed             This Visit's Progress  Exercise 150 minutes per week (moderate activity)   Not on track    Chair exercises daily        Depression Screen PHQ 2/9 Scores 08/29/2021 08/26/2021 11/28/2020 11/06/2020 05/14/2020 04/24/2020 01/05/2020  PHQ - 2 Score 1 1 0 0 0 0 0  PHQ- 9 Score 3 2 - - - - -    Fall Risk Fall Risk  08/29/2021 08/26/2021 12/21/2020 11/28/2020 11/06/2020  Falls in the past year? 1 0 1 0 0  Number falls in past yr: 1 - 0 - -  Injury with Fall? 0 - 0 - -  Risk Factor Category  - - - - -  Risk for fall due to : History of fall(s);Impaired balance/gait;Orthopedic patient;Mental status change - History of fall(s) - -  Follow up Education provided;Falls prevention discussed - Education provided - -    FALL RISK PREVENTION PERTAINING TO THE HOME:  Any stairs in or around the home? No  If so, are there any without handrails? No  Home free of loose throw rugs in walkways, pet beds, electrical cords, etc? Yes  Adequate lighting in your home to reduce risk of falls? Yes   ASSISTIVE DEVICES UTILIZED TO PREVENT FALLS:  Life alert? No  Use of a cane, walker or w/c? Yes  Grab bars in the bathroom? Yes  Shower chair or bench in shower? Yes  Elevated toilet seat or a handicapped toilet? Yes   TIMED UP AND GO:  Was the test performed? No .   Cognitive Function:Cognitive status assessed by direct observation. Patient has current diagnosis of cognitive impairment. Patient is followed by neurology for ongoing assessment.  Patient is unable to complete screening 6CIT or MMSE.   MMSE - Mini Mental State Exam 08/29/2021 05/19/2017 07/25/2015  Not completed: Unable to complete;Refused - -  Orientation to time - 4 5  Orientation  to Place - 5 5  Registration - 3 3  Attention/ Calculation - 5 5  Recall - 3 3  Language- name 2 objects - 2 2  Language- repeat - 1 1  Language- follow 3 step command - 3 3  Language- read & follow direction - 1 1  Write a sentence - 1 1  Copy design - 1 1  Total score - 29 30        Immunizations Immunization History  Administered Date(s) Administered   PFIZER(Purple Top)SARS-COV-2 Vaccination 09/02/2019, 09/23/2019, 07/06/2020   PPD Test 01/11/2020   Pneumococcal Polysaccharide-23 04/29/2011   Tdap 04/29/2011    TDAP status: Due, Education has been provided regarding the importance of this vaccine. Advised may receive this vaccine at local pharmacy or Health Dept. Aware to provide a copy of the vaccination record if obtained from local pharmacy or Health Dept. Verbalized acceptance and understanding.  Flu Vaccine status: Declined, Education has been provided regarding the importance of this vaccine but patient still declined. Advised may receive this vaccine at local pharmacy or Health Dept. Aware to provide a copy of the vaccination record if obtained from local pharmacy or Health Dept. Verbalized acceptance and understanding.  Pneumococcal vaccine status: Due, Education has been provided regarding the importance of this vaccine. Advised may receive this vaccine at local pharmacy or Health Dept. Aware to provide a copy of the vaccination record if obtained from local pharmacy or Health Dept. Verbalized acceptance and understanding.  Covid-19 vaccine status: Completed vaccines  Qualifies for Shingles Vaccine? Yes   Zostavax completed No   Shingrix Completed?: No.  Education has been provided regarding the importance of this vaccine. Patient has been advised to call insurance company to determine out of pocket expense if they have not yet received this vaccine. Advised may also receive vaccine at local pharmacy or Health Dept. Verbalized acceptance and  understanding.  Screening Tests Health Maintenance  Topic Date Due   PAP SMEAR-Modifier  09/19/2016   COVID-19 Vaccine (4 - Booster for Pfizer series) 09/11/2021 (Originally 08/31/2020)   INFLUENZA VACCINE  11/08/2021 (Originally 03/11/2021)   Zoster Vaccines- Shingrix (1 of 2) 11/24/2021 (Originally 08/29/1990)   MAMMOGRAM  11/28/2021 (Originally 06/30/2018)   DEXA SCAN  11/28/2021 (Originally 07/12/2017)   Pneumonia Vaccine 6+ Years old (2 - PCV) 08/26/2022 (Originally 04/28/2012)   TETANUS/TDAP  08/26/2022 (Originally 04/28/2021)   HPV VACCINES  Aged Out    Health Maintenance  Health Maintenance Due  Topic Date Due   PAP SMEAR-Modifier  09/19/2016    Colorectal cancer screening: No longer required.   Mammogram status: No longer required due to age.  Bone Density scan: declined  Lung Cancer Screening: (Low Dose CT Chest recommended if Age 56-80 years, 30 pack-year currently smoking OR have quit w/in 15years.) does not qualify  Additional Screening:  Hepatitis C Screening: does not qualify  Vision Screening: Recommended annual ophthalmology exams for early detection of glaucoma and other disorders of the eye. Is the patient up to date with their annual eye exam?  No  Who is the provider or what is the name of the office in which the patient attends annual eye exams? none If pt is not established with a provider, would they like to be referred to a provider to establish care? No .   Dental Screening: Recommended annual dental exams for proper oral hygiene  Community Resource Referral / Chronic Care Management: CRR required this visit?  No   CCM required this visit?  No      Plan:     I have personally reviewed and noted the following in the patients chart:   Medical and social history Use of alcohol, tobacco or illicit drugs  Current medications and supplements including opioid prescriptions.  Functional ability and status Nutritional status Physical  activity Advanced directives List of other physicians Hospitalizations, surgeries, and ER visits in previous 12 months Vitals Screenings to include cognitive, depression, and falls Referrals and appointments  In addition, I have reviewed and discussed with patient certain preventive protocols, quality metrics, and best practice recommendations. A written personalized care plan for preventive services as well as general preventive health recommendations were provided to patient.     Sandrea Hammond, LPN   4/43/1540   Nurse Notes: none

## 2021-09-30 ENCOUNTER — Telehealth: Payer: Self-pay | Admitting: Nurse Practitioner

## 2021-09-30 DIAGNOSIS — E785 Hyperlipidemia, unspecified: Secondary | ICD-10-CM

## 2021-09-30 MED ORDER — ROSUVASTATIN CALCIUM 10 MG PO TABS: 10.0000 mg | ORAL_TABLET | Freq: Every day | ORAL | 3 refills | Status: DC

## 2021-09-30 NOTE — Telephone Encounter (Signed)
Changed form lipitor 80mg  to crestor 10mg  and see if that is easier for her to swallow.

## 2021-09-30 NOTE — Telephone Encounter (Signed)
Changed from lipitor 80mg  to crestor 10mg  and see if that is easier for her to swallow.

## 2021-09-30 NOTE — Telephone Encounter (Signed)
Patient aware and verbalized understanding. °

## 2021-09-30 NOTE — Telephone Encounter (Signed)
Patient's daughter calling because the patient is taking atorvastatin (LIPITOR) 80 MG tablet but is wanting to know if there is anything she can take that would be the same dosage wise but a pill that is smaller. Please call back and advise.

## 2021-10-15 DIAGNOSIS — Z20822 Contact with and (suspected) exposure to covid-19: Secondary | ICD-10-CM | POA: Diagnosis not present

## 2021-10-18 ENCOUNTER — Telehealth: Payer: Self-pay

## 2021-10-18 NOTE — Chronic Care Management (AMB) (Signed)
?  Chronic Care Management  ? ?Note ? ?10/18/2021 ?Name: Brooke Chavez MRN: [id number] DOB: April 07, 1941 ? ?Brooke Chavez is a 81 y.o. year old female who is a primary care patient of [name], FNP. I reached out to [place] by phone today in response to a referral sent by Ms. [place] PCP. ? ?Ms. Roell was given information about Chronic Care Management services today including:  ?CCM service includes personalized support from designated clinical staff supervised by her physician, including individualized plan of care and coordination with other care providers ?24/7 contact phone numbers for assistance for urgent and routine care needs. ?Service will only be billed when office clinical staff spend 20 minutes or more in a month to coordinate care. ?Only one practitioner may furnish and bill the service in a calendar month. ?The patient may stop CCM services at any time (effective at the end of the month) by phone call to the office staff. ?The patient is responsible for co-pay (up to 20% after annual deductible is met) if co-pay is required by the individual health plan.  ? ?Patient agreed to services and verbal consent obtained.  ? ?Follow up plan: ?Telephone appointment with care management team member scheduled for:10/21/2021 ? ?Noreene Larsson, RMA ?Care Guide, Embedded Care Coordination ?[facility]  ?[place], [place] [address] ?Direct Dial: [phone] ?[email or web address] ?Website: [email or web address]  ? ?

## 2021-10-21 ENCOUNTER — Ambulatory Visit (INDEPENDENT_AMBULATORY_CARE_PROVIDER_SITE_OTHER): Payer: Medicare Other | Admitting: *Deleted

## 2021-10-21 DIAGNOSIS — E785 Hyperlipidemia, unspecified: Secondary | ICD-10-CM

## 2021-10-21 DIAGNOSIS — I69398 Other sequelae of cerebral infarction: Secondary | ICD-10-CM

## 2021-10-21 DIAGNOSIS — R269 Unspecified abnormalities of gait and mobility: Secondary | ICD-10-CM

## 2021-10-21 DIAGNOSIS — I10 Essential (primary) hypertension: Secondary | ICD-10-CM

## 2021-10-21 NOTE — Chronic Care Management (AMB) (Signed)
Chronic Care Management   CCM RN Visit Note  10/21/2021 Name: Brooke Chavez MRN: [id number] DOB: [birth year]  Subjective: Brooke Chavez is a 81 y.o. year old female who is a primary care patient of [name], [place]. The care management team was consulted for assistance with disease management and care coordination needs.    Engaged with patient by telephone for initial visit in response to provider referral for case management and/or care coordination services.   Consent to Services:  The patient was given the following information about Chronic Care Management services today, agreed to services, and gave verbal consent: 1. CCM service includes personalized support from designated clinical staff supervised by the primary care provider, including individualized plan of care and coordination with other care providers 2. 24/7 contact phone numbers for assistance for urgent and routine care needs. 3. Service will only be billed when office clinical staff spend 20 minutes or more in a month to coordinate care. 4. Only one practitioner may furnish and bill the service in a calendar month. 5.The patient may stop CCM services at any time (effective at the end of the month) by phone call to the office staff. 6. The patient will be responsible for cost sharing (co-pay) of up to 20% of the service fee (after annual deductible is met). Patient agreed to services and consent obtained.  Patient agreed to services and verbal consent obtained.   Assessment: Review of patient past medical history, allergies, medications, health status, including review of consultants reports, laboratory and other test data, was performed as part of comprehensive evaluation and provision of chronic care management services.   SDOH (Social Determinants of Health) assessments and interventions performed:    CCM Care Plan  Allergies  Allergen Reactions   Oxycodone-Acetaminophen Anxiety and Other (See Comments)     Hallucinations   Aspirin Nausea And Vomiting and Nausea Only    Other reaction(s): GI Upset (intolerance)    Outpatient Encounter Medications as of 10/21/2021  Medication Sig   acetaminophen (TYLENOL) 325 MG tablet Take 2 tablets (650 mg total) by mouth every 6 (six) hours as needed for mild pain (or Fever >/= 101).   amLODipine (NORVASC) 5 MG tablet Take 1 tablet (5 mg total) by mouth daily.   apixaban (ELIQUIS) 5 MG TABS tablet Take 1 tablet (5 mg total) by mouth 2 (two) times daily. Needs to be seen for any further refill   diclofenac Sodium (VOLTAREN) 1 % GEL Apply 4 g topically 4 (four) times daily.   furosemide (LASIX) 40 MG tablet Take 1 tablet (40 mg total) by mouth daily.   losartan (COZAAR) 100 MG tablet Take 1 tablet (100 mg total) by mouth daily. (NEEDS TO BE SEEN BEFORE NEXT REFILL)   rosuvastatin (CRESTOR) 10 MG tablet Take 1 tablet (10 mg total) by mouth daily.   traZODone (DESYREL) 50 MG tablet Take 1 tablet (50 mg total) by mouth at bedtime. (Patient taking differently: Take 25 mg by mouth at bedtime.)   triamcinolone 0.1% oint-Eucerin equivalent cream 1:1 mixture Apply topically 2 (two) times daily.   No facility-administered encounter medications on file as of 10/21/2021.    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)    Gait disturbance, post-stroke    Dysphagia, post-stroke    Advance care planning    Acute ischemic left MCA stroke ([place]) 01/24/2018   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   Chronic diastolic congestive heart failure  ([place])    BMI 30.0-30.9,adult 07/24/2015   Osteoporosis 09/19/2014   Hip fracture requiring operative repair ([place]) 11/13/2011   GERD (gastroesophageal reflux disease) 07/29/2011   PAF (paroxysmal atrial fibrillation) ([place]) 05/29/2010   Hyperlipidemia with target LDL less than 100 01/04/2009   Essential hypertension 01/04/2009    Conditions to be addressed/monitored:HTN, HLD, and latent effects of CVA  Care Plan : [facility] Care Plan  Updates made by Ilean China, RN since 10/21/2021 12:00 AM     Problem: Chronic Disease Management Needs   Priority: High  Onset Date: 10/21/2021     Goal: Patient will Work with RN Care Manager Regarding Care Management and Care Coordination Associated with HTN HLD, Hx CVA with latent effects, AFib, CKD, expressive aphasia, gait disturbance   Start Date: 10/21/2021  Expected End Date: 10/22/2022  This Visit's Progress: On track  Priority: High  Note:   Current Barriers:  Care Coordination needs related to Financial constraints related to medication costs (Eliquis)  Chronic Disease Management support and education needs related to HTN HLD, Hx CVA with latent effects, AFib, CKD, expressive aphasia, gait disturbance Film/video editor.   RNCM Clinical Goal(s):  Patient will continue to work with RN Care Manager and/or Social Worker to address care management and care coordination needs related to HTN HLD, Hx CVA with latent effects, AFib, CKD, expressive aphasia, gait disturbance as evidenced by adherence to CM Team Scheduled appointments     through collaboration with LCSW, provider, and care team.   Interventions: 1:1 collaboration with primary care provider regarding development and update of comprehensive plan of care as evidenced by provider attestation and co-signature Inter-disciplinary care team collaboration (see longitudinal plan of care) Evaluation of current treatment plan related to  self management and patient's adherence to plan as  established by provider Assessed family/social support Lives with daughter. Has 3 children that assist with finances, errands, ADLs, etc Discussed mobility and ability to perform ADLs Was completely independent prior to CVA in 2019   SDOH Barriers (Status: New goal.) Short Term Goal  Patient interviewed and SDOH assessment performed     Discussed cost of medication. Eliquis is $145 for 90 day supply after meeting deductible. Prior to deductible it can be > $600 per month. Her 3 children help to pay for medication.  Provided with information regarding Eliqus prescription assistance requirements    Provided patient with information about Medicare Extra Help Prepared written materials regarding Medicare Extra Help and collaborated with [facility] front office staff to print and mail resources    Falls:  (Status: New goal.) Long Term Goal  Reviewed medications and discussed potential side effects of medications such as dizziness and frequent urination Advised patient of importance of notifying provider of falls Assessed for signs and symptoms of orthostatic hypotension Assessed for falls since last encounter Assessed social determinant of health barriers Encouraged continued use of walker for ambulation and shower chair for bathing Encouraged to move carefully and change positions slowly to avoid falls Discussed physical limitations due to CVA in 2019   Hyperlipidemia:  (Status: New goal.) Long Term Goal  Lab  Results  Component Value Date   CHOL 176 08/26/2021   HDL 53 08/26/2021   LDLCALC 103 (H) 08/26/2021   TRIG 109 08/26/2021   CHOLHDL 3.3 08/26/2021    Medication review performed; medication list updated in electronic medical record.  Provider established cholesterol goals reviewed; Counseled on importance of regular laboratory monitoring as prescribed; Reviewed role and benefits of statin for ASCVD risk reduction; Reviewed importance of limiting foods high in cholesterol; Assessed  social determinant of health barriers;    Hypertension: (Status: New goal.) Long Term Goal  Last practice recorded BP readings:  BP Readings from Last 3 Encounters:  08/26/21 (!) 162/80  08/14/21 (!) 169/76  12/21/20 (!) 138/51  Most recent eGFR/CrCl:  Lab Results  Component Value Date   EGFR 31 (L) 08/26/2021    No components found for: CRCL  Evaluation of current treatment plan related to hypertension self management and patient's adherence to plan as established by provider;   Provided education to patient re: stroke prevention, s/s of heart attack and stroke; Reviewed medications with patient and discussed importance of compliance;  Discussed plans with patient for ongoing care management follow up and provided patient with direct contact information for care management team; Advised patient, providing education and rationale, to monitor blood pressure daily and record, calling PCP for findings outside established parameters;  Discussed complications of poorly controlled blood pressure such as heart disease, stroke, circulatory complications, vision complications, kidney impairment, sexual dysfunction;    Patient Goals/Self-Care Activities: Take medications as prescribed   Attend all scheduled provider appointments Call provider office for new concerns or questions  Review information regarding Medicare Extra Help and apply online or request paper application by telephone check blood pressure daily write blood pressure results in a log or diary take blood pressure log to all doctor appointments call doctor for signs and symptoms of high blood pressure report new symptoms to your doctor eat more whole grains, fruits and vegetables, lean meats and healthy fats  Plan:Telephone follow up appointment with care management team member scheduled for:  11/26/21 with RNCM The patient has been provided with contact information for the care management team and has been advised to call with  any health related questions or concerns.   Chong Sicilian, BSN, RN-BC Embedded Chronic Care Manager [facility] / [place] [facility] Direct Dial: [phone]

## 2021-10-21 NOTE — Patient Instructions (Signed)
Visit Information ? ?Patient Care Plan: [facility] Care Plan  ?  ? ?Problem Identified: Chronic Disease Management Needs   ?Priority: High  ?Onset Date: 10/21/2021  ?  ? ?Goal: Patient will Work with RN Care Manager Regarding Care Management and [place] with HTN HLD, Hx CVA with latent effects, AFib, CKD, expressive aphasia, gait disturbance   ?Start Date: 10/21/2021  ?Expected End Date: 10/22/2022  ?This Visit's Progress: On track  ?Priority: High  ?Note:   ?Current Barriers:  ?Care Coordination needs related to Financial constraints related to medication costs (Eliquis)  ?Chronic Disease Management support and education needs related to HTN HLD, Hx CVA with latent effects, AFib, CKD, expressive aphasia, gait disturbance ?Film/video editor.  ? ?RNCM Clinical Goal(s):  ?Patient will continue to work with RN Care Manager and/or Social Worker to address care management and care coordination needs related to HTN HLD, Hx CVA with latent effects, AFib, CKD, expressive aphasia, gait disturbance as evidenced by adherence to CM Team Scheduled appointments     through collaboration with LCSW, provider, and care team.  ? ?Interventions: ?1:1 collaboration with primary care provider regarding development and update of comprehensive plan of care as evidenced by provider attestation and co-signature ?Inter-disciplinary care team collaboration (see longitudinal plan of care) ?Evaluation of current treatment plan related to  self management and patient's adherence to plan as established by provider ?Assessed family/social support ?Lives with daughter. Has 3 children that assist with finances, errands, ADLs, etc ?Discussed mobility and ability to perform ADLs ?Was completely independent prior to CVA in 2019 ? ? ?SDOH Barriers (Status: New goal.) Short Term Goal  ?Patient interviewed and SDOH assessment performed     ?Discussed cost of medication. Eliquis is $145 for 90 day supply after meeting deductible. Prior to  deductible it can be > $600 per month. Her 3 children help to pay for medication.  ?Provided with information regarding Eliqus prescription assistance requirements    ?Provided patient with information about Medicare Extra Help ?Prepared written materials regarding Medicare Extra Help and collaborated with [facility] front office staff to print and mail resources ?  ? ?Falls:  (Status: New goal.) Long Term Goal  ?Reviewed medications and discussed potential side effects of medications such as dizziness and frequent urination ?Advised patient of importance of notifying provider of falls ?Assessed for signs and symptoms of orthostatic hypotension ?Assessed for falls since last encounter ?Assessed social determinant of health barriers ?Encouraged continued use of walker for ambulation and shower chair for bathing ?Encouraged to move carefully and change positions slowly to avoid falls ?Discussed physical limitations due to CVA in 2019 ? ? ?Hyperlipidemia:  (Status: New goal.) Long Term Goal  ?Lab Results  ?Component Value Date  ? CHOL 176 08/26/2021  ? HDL 53 08/26/2021  ? LDLCALC 103 (H) 08/26/2021  ? TRIG 109 08/26/2021  ? CHOLHDL 3.3 08/26/2021  ?  ?Medication review performed; medication list updated in electronic medical record.  ?Provider established cholesterol goals reviewed; ?Counseled on importance of regular laboratory monitoring as prescribed; ?Reviewed role and benefits of statin for ASCVD risk reduction; ?Reviewed importance of limiting foods high in cholesterol; ?Assessed social determinant of health barriers;  ? ? ?Hypertension: (Status: New goal.) Long Term Goal  ?Last practice recorded BP readings:  ?BP Readings from Last 3 Encounters:  ?08/26/21 (!) 162/80  ?08/14/21 (!) 169/76  ?12/21/20 (!) 138/51  ?Most recent eGFR/CrCl:  ?Lab Results  ?Component Value Date  ? EGFR 31 (L) 08/26/2021  ?  No  components found for: CRCL ? ?Evaluation of current treatment plan related to hypertension self management and  patient's adherence to plan as established by provider;   ?Provided education to patient re: stroke prevention, s/s of heart attack and stroke; ?Reviewed medications with patient and discussed importance of compliance;  ?Discussed plans with patient for ongoing care management follow up and provided patient with direct contact information for care management team; ?Advised patient, providing education and rationale, to monitor blood pressure daily and record, calling PCP for findings outside established parameters;  ?Discussed complications of poorly controlled blood pressure such as heart disease, stroke, circulatory complications, vision complications, kidney impairment, sexual dysfunction;  ? ? ?Patient Goals/Self-Care Activities: ?Take medications as prescribed   ?Attend all scheduled provider appointments ?Call provider office for new concerns or questions  ?Review information regarding Medicare Extra Help and apply online or request paper application by telephone ?check blood pressure daily ?write blood pressure results in a log or diary ?take blood pressure log to all doctor appointments ?call doctor for signs and symptoms of high blood pressure ?report new symptoms to your doctor ?eat more whole grains, fruits and vegetables, lean meats and healthy fats ? ? ?  ?  ? ?Patient verbalizes understanding of instructions and care plan provided today and agrees to view in [place]. Active MyChart status confirmed with patient.   ? ?Ms. Christo was given information about Chronic Care Management services today by me including:  ?CCM service includes personalized support from designated clinical staff supervised by the primary care provider, including individualized plan of care and coordination with other care providers ?24/7 contact phone numbers for assistance for urgent and routine care needs. ?Service will only be billed when office clinical staff spend 20 minutes or more in a month to coordinate care. ?Only one  practitioner may furnish and bill the service in a calendar month. ?The patient may stop CCM services at any time (effective at the end of the month) by phone call to the office staff. ?The patient will be responsible for cost sharing (co-pay) of up to 20% of the service fee (after annual deductible is met).  ? ?Patient agreed to services and verbal consent obtained.  ?  ? ?Plan:Telephone follow up appointment with care management team member scheduled for:  11/26/21 with RNCM ?The patient has been provided with contact information for the care management team and has been advised to call with any health related questions or concerns.  ? ?Chong Sicilian, BSN, RN-BC ?Embedded Chronic Care Manager ?[place] / [place] Management ?Direct Dial: [phone] ?

## 2021-10-29 ENCOUNTER — Telehealth: Payer: Self-pay

## 2021-10-29 NOTE — Chronic Care Management (AMB) (Signed)
?  Care Management  ? ?Note ? ?10/29/2021 ?Name: SHALETHA HUMBLE MRN: [id number] DOB: 1941-05-09 ? ?Zahniya KANAI HILGER is a 81 y.o. year old female who is a primary care patient of [name], [place] and is actively engaged with the care management team. I reached out to [place] by phone today to assist with re-scheduling a follow up visit with the RN Case Manager ? ?Follow up plan: ?Unsuccessful telephone outreach attempt made. A HIPAA compliant phone message was left for the patient providing contact information and requesting a return call.  ?The care management team will reach out to the patient again over the next 7 days.  ?If patient returns call to provider office, please advise to call [place]  at [phone] ? ?Noreene Larsson, RMA ?Care Guide, Embedded Care Coordination ?[facility]  Care Management  ?[place], [place] [address] ?Direct Dial: [phone] ?Museum/gallery [email or web address].[email or web address] ?Website: [email or web address]  ? ?

## 2021-11-02 ENCOUNTER — Other Ambulatory Visit: Payer: Self-pay | Admitting: Nurse Practitioner

## 2021-11-02 DIAGNOSIS — I480 Paroxysmal atrial fibrillation: Secondary | ICD-10-CM

## 2021-11-05 DIAGNOSIS — Z1152 Encounter for screening for COVID-19: Secondary | ICD-10-CM | POA: Diagnosis not present

## 2021-11-05 DIAGNOSIS — Z20822 Contact with and (suspected) exposure to covid-19: Secondary | ICD-10-CM | POA: Diagnosis not present

## 2021-11-08 DIAGNOSIS — I10 Essential (primary) hypertension: Secondary | ICD-10-CM | POA: Diagnosis not present

## 2021-11-08 DIAGNOSIS — E785 Hyperlipidemia, unspecified: Secondary | ICD-10-CM

## 2021-11-18 DIAGNOSIS — Z029 Encounter for administrative examinations, unspecified: Secondary | ICD-10-CM

## 2021-11-26 ENCOUNTER — Telehealth: Payer: Medicare Other

## 2021-11-26 ENCOUNTER — Ambulatory Visit (HOSPITAL_BASED_OUTPATIENT_CLINIC_OR_DEPARTMENT_OTHER)
Admission: RE | Admit: 2021-11-26 | Discharge: 2021-11-26 | Disposition: A | Discharge status: Home / Self Care | Payer: Medicare Other | Source: Ambulatory Visit | Attending: Nurse Practitioner | Admitting: Nurse Practitioner

## 2021-11-26 DIAGNOSIS — K8689 Other specified diseases of pancreas: Secondary | ICD-10-CM | POA: Insufficient documentation

## 2021-11-26 DIAGNOSIS — K5730 Diverticulosis of large intestine without perforation or abscess without bleeding: Secondary | ICD-10-CM | POA: Diagnosis not present

## 2021-11-26 DIAGNOSIS — K7689 Other specified diseases of liver: Secondary | ICD-10-CM | POA: Diagnosis not present

## 2021-11-26 DIAGNOSIS — I700 Atherosclerosis of aorta: Secondary | ICD-10-CM | POA: Diagnosis not present

## 2021-11-26 DIAGNOSIS — N281 Cyst of kidney, acquired: Secondary | ICD-10-CM | POA: Diagnosis not present

## 2021-11-27 ENCOUNTER — Telehealth: Payer: Self-pay

## 2021-11-27 NOTE — Telephone Encounter (Signed)
-----   Message from Owens Shark, NP sent at 11/27/2021  7:45 AM EDT ----- ?Please let her daughter know CTs are unchanged , f/u as scheduled ? ?

## 2021-11-27 NOTE — Telephone Encounter (Signed)
Called and left a vm to let the daughter know CT's are unchanged, f/u as scheduled  ?

## 2021-11-28 DIAGNOSIS — Z20822 Contact with and (suspected) exposure to covid-19: Secondary | ICD-10-CM | POA: Diagnosis not present

## 2021-12-05 NOTE — Chronic Care Management (AMB) (Signed)
?  Care Management  ? ?Note ? ?12/05/2021 ?Name: KAMI KUBE MRN: [id number] DOB: Jun 25, 1941 ? ?Apphia BRANDIN DILDAY is a 81 y.o. year old female who is a primary care patient of [name], [place] and is actively engaged with the care management team. I reached out to [place] by phone today to assist with re-scheduling a follow up visit with the RN Case Manager ? ?Follow up plan: ?Unsuccessful telephone outreach attempt made. A HIPAA compliant phone message was left for the patient providing contact information and requesting a return call.  ?The care management team will reach out to the patient again over the next 7 days.  ?If patient returns call to provider office, please advise to call [place]  at [phone] ? ?Noreene Larsson, RMA ?Care Guide, Embedded Care Coordination ?  Care Management  ?[place], [place] [address] ?Direct Dial: [phone] ?Museum/gallery conservator.[email or web address] ?Website: [email or web address]  ? ?

## 2021-12-05 NOTE — Chronic Care Management (AMB) (Signed)
?  Care Management  ? ?Note ? ?12/05/2021 ?Name: Brooke Chavez MRN: [id number] DOB: October 23, 1940 ? ?Brooke Chavez is a 81 y.o. year old female who is a primary care patient of [name], [place] and is actively engaged with the care management team. I reached out to [place] by phone today to assist with re-scheduling a follow up visit with the RN Case Manager ? ?Follow up plan: ?Patient declines further follow up and engagement by the care management team. Appropriate care team members and provider have been notified via electronic communication.  ? ?Noreene Larsson, RMA ?Care Guide, Embedded Care Coordination ?[facility]  Care Management  ?[place], [place] [address] ?Direct Dial: [phone] ?Museum/gallery conservator.[email or web address] ?Website: [email or web address]  ? ?

## 2021-12-07 DIAGNOSIS — Z20822 Contact with and (suspected) exposure to covid-19: Secondary | ICD-10-CM | POA: Diagnosis not present

## 2021-12-11 DIAGNOSIS — Z20822 Contact with and (suspected) exposure to covid-19: Secondary | ICD-10-CM | POA: Diagnosis not present

## 2021-12-12 DIAGNOSIS — Z20822 Contact with and (suspected) exposure to covid-19: Secondary | ICD-10-CM | POA: Diagnosis not present

## 2021-12-14 DIAGNOSIS — Z20822 Contact with and (suspected) exposure to covid-19: Secondary | ICD-10-CM | POA: Diagnosis not present

## 2021-12-16 DIAGNOSIS — Z20822 Contact with and (suspected) exposure to covid-19: Secondary | ICD-10-CM | POA: Diagnosis not present

## 2021-12-25 ENCOUNTER — Ambulatory Visit (INDEPENDENT_AMBULATORY_CARE_PROVIDER_SITE_OTHER): Payer: Medicare Other | Admitting: Family Medicine

## 2021-12-25 ENCOUNTER — Ambulatory Visit (INDEPENDENT_AMBULATORY_CARE_PROVIDER_SITE_OTHER): Payer: Medicare Other

## 2021-12-25 ENCOUNTER — Encounter: Payer: Self-pay | Admitting: Family Medicine

## 2021-12-25 VITALS — BP 142/61 | HR 76 | Temp 98.9°F | Ht 63.0 in | Wt 163.8 lb

## 2021-12-25 DIAGNOSIS — M25571 Pain in right ankle and joints of right foot: Secondary | ICD-10-CM

## 2021-12-25 DIAGNOSIS — L03115 Cellulitis of right lower limb: Secondary | ICD-10-CM

## 2021-12-25 DIAGNOSIS — M7989 Other specified soft tissue disorders: Secondary | ICD-10-CM | POA: Diagnosis not present

## 2021-12-25 MED ORDER — MINOCYCLINE HCL 100 MG PO CAPS: 100.0000 mg | ORAL_CAPSULE | Freq: Two times a day (BID) | ORAL | 0 refills | Status: AC

## 2021-12-25 NOTE — Progress Notes (Signed)
?  ? ?Subjective:  ?Patient ID: Brooke Chavez, female    DOB: 05/06/1941, 81 y.o.   MRN: [id number] ? ?Patient Care Team: ?[name], FNP as PCP - General (Nurse Practitioner) ?[name], MD as PCP - Cardiology (Cardiology) ?Ilean China, RN as Case Manager  ? ?Chief Complaint:  Joint Swelling (Right ankle/Warm to the touch) ? ? ?HPI: ?Brooke Chavez is a 81 y.o. female presenting on 12/25/2021 for Joint Swelling (Right ankle/Warm to the touch) ? ? ?Pt presents today with her family for evaluation of right ankle swelling, redness, and increased warmth. No reported injuries. Does sit with feet down for long periods of time during the day. Pt states the ankle is tender to palpation but she is able to walk without difficulty. No fever, chills, increased weakness, or confusion. This started 2 days ago. No changes in urinary output.  ? ? ? ?Relevant past medical, surgical, family, and social history reviewed and updated as indicated.  ?Allergies and medications reviewed and updated. Data reviewed: Chart in Epic. ? ? ?Past Medical History:  ?Diagnosis Date  ? Atrial flutter ([place])   ? Typical by EKG diagnosis 9/11 s/p CIT ablation 11/11  ? Bradycardia   ? Chronic diastolic congestive heart failure ([place])   ? 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 DJD (degenerative joint disease)   ? Dysrhythmia   ? Femur fracture, left ([place]) 2013  ? Global aphasia   ? Hyperlipidemia   ? x5 years  ? Hypertension   ? Since 1997  ? 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 LMCA stroke post MV repair  01/24/18 (INR 1.7)  ? Liver masses 11/13/2017  ? Multiple small nodules seen on CT and MRI  ? Mitral regurgitation   ? severe  ? Pancreatic mass 11/13/2017  ? 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  ? Complex valvuloplasty including artificial Gore-tex neochord placement x6 and 28 mm Sorin Memo 4D ring annuloplasty via right mini thoracotomy approach  ? Stroke [facility])   ? TR  (tricuspid regurgitation)   ? Mild with RA enlargment  ? ? ?Past Surgical History:  ?Procedure Laterality Date  ? A FLUTTER ABLATION N/A 06/27/2011  ? Procedure: ABLATION A FLUTTER;  Surgeon: Thompson Grayer, MD;  Location[facility] CATH LAB;  Service: Cardiovascular;  Laterality: N/A;  ? ATRIAL ABLATION SURGERY  06/2010  ? HIP SURGERY    ? Left (fracture) 3/13  ? JOINT REPLACEMENT    ? both knees   ? KNEE ARTHROSCOPY    ? Right  ? LUMBAR SPINE SURGERY    ? MITRAL VALVE REPAIR Right 01/07/2018  ? Procedure: MINIMALLY INVASIVE MITRAL VALVE REPAIR using LivaNova ring size 28 MM;  Surgeon: Rexene Alberts, MD;  Location: [place];  Service: Open Heart Surgery;  Laterality: Right;  ? PATENT FORAMEN OVALE(PFO) CLOSURE N/A 01/07/2018  ? Procedure: PATENT FORAMEN OVALE (PFO) CLOSURE;  Surgeon: Rexene Alberts, MD;  Location: [place];  Service: Open Heart Surgery;  Laterality: N/A;  ? RIGHT/LEFT HEART CATH AND CORONARY ANGIOGRAPHY N/A 11/11/2017  ? Procedure: RIGHT/LEFT HEART CATH AND CORONARY ANGIOGRAPHY;  Surgeon: Sherren Mocha, MD;  Location: [place] CV LAB;  Service: Cardiovascular;  Laterality: N/A;  ? TEE WITHOUT CARDIOVERSION N/A 09/18/2017  ? Procedure: TRANSESOPHAGEAL ECHOCARDIOGRAM (TEE);  Surgeon: Skeet Latch, MD;  Location: [place];  Service: Cardiovascular;  Laterality: N/A;  ? TEE WITHOUT CARDIOVERSION N/A 01/07/2018  ? Procedure: TRANSESOPHAGEAL  ECHOCARDIOGRAM (TEE);  Surgeon: Rexene Alberts, MD;  Location: [place];  Service: Open Heart Surgery;  Laterality: N/A;  ? TOTAL KNEE ARTHROPLASTY    ? Left  ? TUBAL LIGATION    ? ? ?Social History  ? ?Socioeconomic History  ? Marital status: Widowed  ?  Spouse name: Not on file  ? Number of children: 3  ? Years of education: 62  ? Highest education level: Not on file  ?Occupational History  ?  Comment: na  ?Tobacco Use  ? Smoking status: Never  ? Smokeless tobacco: Never  ?Vaping Use  ? Vaping Use: Never used  ?Substance and Sexual Activity  ? Alcohol use: No  ? Drug  use: No  ? Sexual activity: Never  ?Other Topics Concern  ? Not on file  ?Social History Narrative  ? Her daughter lives with her - Brooke Chavez  ? Patient unable to talk  ? Little caffeine  ? ?Social Determinants of Health  ? ?Financial Resource Strain: Low Risk   ? Difficulty of Paying Living Expenses: Not very hard  ?Food Insecurity: No Food Insecurity  ? Worried About Charity fundraiser in the Last Year: Never true  ? Ran Out of Food in the Last Year: Never true  ?Transportation Needs: No Transportation Needs  ? Lack of Transportation (Medical): No  ? Lack of Transportation (Non-Medical): No  ?Physical Activity: Insufficiently Active  ? Days of Exercise per Week: 7 days  ? Minutes of Exercise per Session: 10 min  ?Stress: No Stress Concern Present  ? Feeling of Stress : Only a little  ?Social Connections: Moderately Integrated  ? Frequency of Communication with Friends and Family: More than three times a week  ? Frequency of Social Gatherings with Friends and Family: More than three times a week  ? Attends Religious Services: 1 to 4 times per year  ? Active Member of Clubs or Organizations: Yes  ? Attends Archivist Meetings: 1 to 4 times per year  ? Marital Status: Widowed  ?Intimate Partner Violence: Not At Risk  ? Fear of Current or Ex-Partner: No  ? Emotionally Abused: No  ? Physically Abused: No  ? Sexually Abused: No  ? ? ?Outpatient Encounter Medications as of 12/25/2021  ?Medication Sig  ? acetaminophen (TYLENOL) 325 MG tablet Take 2 tablets (650 mg total) by mouth every 6 (six) hours as needed for mild pain (or Fever >/= 101).  ? amLODipine (NORVASC) 5 MG tablet Take 1 tablet (5 mg total) by mouth daily.  ? apixaban (ELIQUIS) 5 MG TABS tablet Take 1 tablet (5 mg total) by mouth 2 (two) times daily.  ? diclofenac Sodium (VOLTAREN) 1 % GEL Apply 4 g topically 4 (four) times daily.  ? furosemide (LASIX) 40 MG tablet Take 1 tablet (40 mg total) by mouth daily.  ? losartan (COZAAR) 100 MG tablet  Take 1 tablet (100 mg total) by mouth daily. (NEEDS TO BE SEEN BEFORE NEXT REFILL)  ? minocycline (MINOCIN) 100 MG capsule Take 1 capsule (100 mg total) by mouth 2 (two) times daily for 10 days.  ? rosuvastatin (CRESTOR) 10 MG tablet Take 1 tablet (10 mg total) by mouth daily.  ? traZODone (DESYREL) 50 MG tablet Take 1 tablet (50 mg total) by mouth at bedtime. (Patient taking differently: Take 25 mg by mouth at bedtime.)  ? triamcinolone 0.1% oint-Eucerin equivalent cream 1:1 mixture Apply topically 2 (two) times daily.  ? ?No facility-administered encounter medications on file  as of 12/25/2021.  ? ? ?Allergies  ?Allergen Reactions  ? Oxycodone-Acetaminophen Anxiety and Other (See Comments)  ?  Hallucinations  ? Aspirin Nausea And Vomiting and Nausea Only  ?  Other reaction(s): GI Upset (intolerance)  ? ? ?Review of Systems  ?Constitutional:  Negative for activity change, appetite change, chills, diaphoresis, fatigue, fever and unexpected weight change.  ?Respiratory:  Negative for cough and shortness of breath.   ?Cardiovascular:  Positive for leg swelling. Negative for chest pain and palpitations.  ?Genitourinary:  Negative for decreased urine volume and difficulty urinating.  ?Musculoskeletal:  Positive for joint swelling.  ?Skin:  Positive for color change.  ?Psychiatric/Behavioral:  Negative for confusion.   ?All other systems reviewed and are negative. ? ?   ? ?Objective:  ?BP (!) 142/61   Pulse 76   Temp 98.9 ?F (37.2 ?C)   Ht '5\' 3"'$  (1.6 m)   Wt 163 lb 12.8 oz (74.3 kg)   LMP 11/05/1992   SpO2 95%   BMI 29.02 kg/m?   ? ?Wt Readings from Last 3 Encounters:  ?12/25/21 163 lb 12.8 oz (74.3 kg)  ?08/29/21 160 lb (72.6 kg)  ?08/26/21 160 lb (72.6 kg)  ? ? ?Physical Exam ?Vitals and nursing note reviewed.  ?Constitutional:   ?   General: She is not in acute distress. ?   Appearance: Normal appearance. She is not ill-appearing, toxic-appearing or diaphoretic.  ?Cardiovascular:  ?   Rate and Rhythm: Normal rate  and regular rhythm.  ?   Heart sounds: Normal heart sounds.  ?Musculoskeletal:  ?   Right lower leg: Normal.  ?   Right ankle: Swelling present. No deformity, ecchymosis or lacerations. Tenderness present. Constance Holster

## 2022-02-04 ENCOUNTER — Other Ambulatory Visit: Payer: Self-pay | Admitting: Nurse Practitioner

## 2022-02-04 DIAGNOSIS — I480 Paroxysmal atrial fibrillation: Secondary | ICD-10-CM

## 2022-03-02 ENCOUNTER — Other Ambulatory Visit: Payer: Self-pay | Admitting: Nurse Practitioner

## 2022-03-03 NOTE — Telephone Encounter (Signed)
Appt made for 03/07/2022, daughter not sure if she needs refill or has enough stated she will call back

## 2022-03-03 NOTE — Telephone Encounter (Signed)
30 days given ntbs

## 2022-03-07 ENCOUNTER — Ambulatory Visit (INDEPENDENT_AMBULATORY_CARE_PROVIDER_SITE_OTHER): Payer: Medicare Other | Admitting: Nurse Practitioner

## 2022-03-07 ENCOUNTER — Encounter: Payer: Self-pay | Admitting: Nurse Practitioner

## 2022-03-07 VITALS — BP 132/78 | HR 67 | Temp 98.6°F | Resp 20 | Ht 63.0 in | Wt 165.0 lb

## 2022-03-07 DIAGNOSIS — N1831 Chronic kidney disease, stage 3a: Secondary | ICD-10-CM

## 2022-03-07 DIAGNOSIS — E785 Hyperlipidemia, unspecified: Secondary | ICD-10-CM | POA: Diagnosis not present

## 2022-03-07 DIAGNOSIS — I5032 Chronic diastolic (congestive) heart failure: Secondary | ICD-10-CM | POA: Diagnosis not present

## 2022-03-07 DIAGNOSIS — I69351 Hemiplegia and hemiparesis following cerebral infarction affecting right dominant side: Secondary | ICD-10-CM | POA: Diagnosis not present

## 2022-03-07 DIAGNOSIS — Z6830 Body mass index (BMI) 30.0-30.9, adult: Secondary | ICD-10-CM

## 2022-03-07 DIAGNOSIS — I480 Paroxysmal atrial fibrillation: Secondary | ICD-10-CM

## 2022-03-07 DIAGNOSIS — F482 Pseudobulbar affect: Secondary | ICD-10-CM

## 2022-03-07 DIAGNOSIS — R609 Edema, unspecified: Secondary | ICD-10-CM

## 2022-03-07 DIAGNOSIS — I10 Essential (primary) hypertension: Secondary | ICD-10-CM

## 2022-03-07 DIAGNOSIS — F5101 Primary insomnia: Secondary | ICD-10-CM

## 2022-03-07 DIAGNOSIS — K219 Gastro-esophageal reflux disease without esophagitis: Secondary | ICD-10-CM

## 2022-03-07 DIAGNOSIS — I69391 Dysphagia following cerebral infarction: Secondary | ICD-10-CM

## 2022-03-07 MED ORDER — LOSARTAN POTASSIUM 100 MG PO TABS: 100.0000 mg | ORAL_TABLET | Freq: Every day | ORAL | 1 refills | Status: DC

## 2022-03-07 MED ORDER — TRAZODONE HCL 50 MG PO TABS: 50.0000 mg | ORAL_TABLET | Freq: Every day | ORAL | 1 refills | Status: DC

## 2022-03-07 MED ORDER — APIXABAN 5 MG PO TABS: 5.0000 mg | ORAL_TABLET | Freq: Two times a day (BID) | ORAL | 0 refills | Status: DC

## 2022-03-07 MED ORDER — ROSUVASTATIN CALCIUM 10 MG PO TABS: 10.0000 mg | ORAL_TABLET | Freq: Every day | ORAL | 3 refills | Status: DC

## 2022-03-07 MED ORDER — FUROSEMIDE 40 MG PO TABS: 40.0000 mg | ORAL_TABLET | Freq: Every day | ORAL | 1 refills | Status: DC

## 2022-03-07 MED ORDER — AMLODIPINE BESYLATE 5 MG PO TABS: 5.0000 mg | ORAL_TABLET | Freq: Every day | ORAL | 0 refills | Status: DC

## 2022-03-07 NOTE — Patient Instructions (Signed)
Peripheral Edema  Peripheral edema is swelling that is caused by a buildup of fluid. Peripheral edema most often affects the lower legs, ankles, and feet. It can also develop in the arms, hands, and face. The area of the body that has peripheral edema will look swollen. It may also feel heavy or warm. Your clothes may start to feel tight. Pressing on the area may make a temporary dent in your skin (pitting edema). You may not be able to move your swollen arm or leg as much as usual. There are many causes of peripheral edema. It can happen because of a complication of other conditions such as heart failure, kidney disease, or a problem with your circulation. It also can be a side effect of certain medicines or happen because of an infection. It often happens to women during pregnancy. Sometimes, the cause is not known. Follow these instructions at home: Managing pain, stiffness, and swelling  Raise (elevate) your legs while you are sitting or lying down. Move around often to prevent stiffness and to reduce swelling. Do not sit or stand for long periods of time. Do not wear tight clothing. Do not wear garters on your upper legs. Exercise your legs to get your circulation going. This helps to move the fluid back into your blood vessels, and it may help the swelling go down. Wear compression stockings as told by your health care provider. These stockings help to prevent blood clots and reduce swelling in your legs. It is important that these are the correct size. These stockings should be prescribed by your doctor to prevent possible injuries. If elastic bandages or wraps are recommended, use them as told by your health care provider. Medicines Take over-the-counter and prescription medicines only as told by your health care provider. Your health care provider may prescribe medicine to help your body get rid of excess water (diuretic). Take this medicine if you are told to take it. General  instructions Eat a low-salt (low-sodium) diet as told by your health care provider. Sometimes, eating less salt may reduce swelling. Pay attention to any changes in your symptoms. Moisturize your skin daily to help prevent skin from cracking and draining. Keep all follow-up visits. This is important. Contact a health care provider if: You have a fever. You have swelling in only one leg. You have increased swelling, redness, or pain in one or both of your legs. You have drainage or sores at the area where you have edema. Get help right away if: You have edema that starts suddenly or is getting worse, especially if you are pregnant or have a medical condition. You develop shortness of breath, especially when you are lying down. You have pain in your chest or abdomen. You feel weak. You feel like you will faint. These symptoms may be an emergency. Get help right away. Call 911. Do not wait to see if the symptoms will go away. Do not drive yourself to the hospital. Summary Peripheral edema is swelling that is caused by a buildup of fluid. Peripheral edema most often affects the lower legs, ankles, and feet. Move around often to prevent stiffness and to reduce swelling. Do not sit or stand for long periods of time. Pay attention to any changes in your symptoms. Contact a health care provider if you have edema that starts suddenly or is getting worse, especially if you are pregnant or have a medical condition. Get help right away if you develop shortness of breath, especially when lying down.   This information is not intended to replace advice given to you by your health care provider. Make sure you discuss any questions you have with your health care provider. Document Revised: 04/01/2021 Document Reviewed: 04/01/2021 Elsevier Patient Education  2023 Elsevier Inc.  

## 2022-03-07 NOTE — Progress Notes (Signed)
Subjective:    Patient ID: Brooke Chavez, female    DOB: 1940-11-09, 81 y.o.   MRN: [id number]   Chief Complaint: medical management of chronic issues     HPI:  Brooke Chavez is a 81 y.o. who identifies as a female who was assigned female at birth.   Social history: Lives with: daughters Work history: disability   Comes in today for follow up of the following chronic medical issues:  1. Essential hypertension No c/o chest pain, sob or headache. Does not check blood pressure at home. BP Readings from Last 3 Encounters:  12/25/21 (!) 142/61  08/26/21 (!) 162/80  08/14/21 (!) 169/76     2. PAF (paroxysmal atrial fibrillation) (HCC) No c/o palpitations or heart racing  3. Chronic diastolic congestive heart failure (HCC) Has some lower ext edema but denies sob.  4. Dysphagia, post-stroke Does well chewing foods so she does not get choked.  5. Gastroesophageal reflux disease without esophagitis Has not been complaining of heart burn symptoms.  6. Hemiparesis of right dominant side as late effect of cerebral infarction [facility]) Doing well. Walks with cane or walker for stability  7. PBA (pseudobulbar affect) Cries very frequently.  8. Stage 3a chronic kidney disease (HCC) No voiding issues Lab Results  Component Value Date   CREATININE 1.65 (H) 08/26/2021     9. Hyperlipidemia with target LDL less than 100 Eats whatever her family fixes her to eat. Has poor appetite Lab Results  Component Value Date   CHOL 176 08/26/2021   HDL 53 08/26/2021   LDLCALC 103 (H) 08/26/2021   TRIG 109 08/26/2021   CHOLHDL 3.3 08/26/2021     10. BMI 30.0-30.9,adult no recent weight changes Wt Readings from Last 3 Encounters:  03/07/22 165 lb (74.8 kg)  12/25/21 163 lb 12.8 oz (74.3 kg)  08/29/21 160 lb (72.6 kg)   BMI Readings from Last 3 Encounters:  03/07/22 29.23 kg/m  12/25/21 29.02 kg/m  08/29/21 28.34 kg/m     New complaints: None today  Allergies   Allergen Reactions   Oxycodone-Acetaminophen Anxiety and Other (See Comments)    Hallucinations   Aspirin Nausea And Vomiting and Nausea Only    Other reaction(s): GI Upset (intolerance)   Outpatient Encounter Medications as of 03/07/2022  Medication Sig   acetaminophen (TYLENOL) 325 MG tablet Take 2 tablets (650 mg total) by mouth every 6 (six) hours as needed for mild pain (or Fever >/= 101).   amLODipine (NORVASC) 5 MG tablet Take 1 tablet (5 mg total) by mouth daily. Needs office visit for further refills   apixaban (ELIQUIS) 5 MG TABS tablet Take 1 tablet (5 mg total) by mouth 2 (two) times daily. (NEEDS TO BE SEEN BEFORE NEXT REFILL)   diclofenac Sodium (VOLTAREN) 1 % GEL Apply 4 g topically 4 (four) times daily.   furosemide (LASIX) 40 MG tablet Take 1 tablet (40 mg total) by mouth daily.   losartan (COZAAR) 100 MG tablet Take 1 tablet (100 mg total) by mouth daily. (NEEDS TO BE SEEN BEFORE NEXT REFILL)   rosuvastatin (CRESTOR) 10 MG tablet Take 1 tablet (10 mg total) by mouth daily.   traZODone (DESYREL) 50 MG tablet Take 1 tablet (50 mg total) by mouth at bedtime. (Patient taking differently: Take 25 mg by mouth at bedtime.)   triamcinolone 0.1% oint-Eucerin equivalent cream 1:1 mixture Apply topically 2 (two) times daily.   No facility-administered encounter medications on file as of 03/07/2022.  Past Surgical History:  Procedure Laterality Date   A FLUTTER ABLATION N/A 06/27/2011   Procedure: ABLATION A FLUTTER;  Surgeon: Thompson Grayer, MD;  [facility] CATH LAB;  Service: Cardiovascular;  Laterality: N/A;   ATRIAL ABLATION SURGERY  06/2010   HIP SURGERY     Left (fracture) 3/13   JOINT REPLACEMENT     both knees    KNEE ARTHROSCOPY     Right   LUMBAR SPINE SURGERY     MITRAL VALVE REPAIR Right 01/07/2018   Procedure: MINIMALLY INVASIVE MITRAL VALVE REPAIR using LivaNova ring size 28 MM;  Surgeon: Rexene Alberts, MD;  Location: [place];  Service: Open Heart Surgery;   Laterality: Right;   PATENT FORAMEN OVALE(PFO) CLOSURE N/A 01/07/2018   Procedure: PATENT FORAMEN OVALE (PFO) CLOSURE;  Surgeon: Rexene Alberts, MD;  Location: [place];  Service: Open Heart Surgery;  Laterality: N/A;   RIGHT/LEFT HEART CATH AND CORONARY ANGIOGRAPHY N/A 11/11/2017   Procedure: RIGHT/LEFT HEART CATH AND CORONARY ANGIOGRAPHY;  Surgeon: Sherren Mocha, MD;  Location: [place] CV LAB;  Service: Cardiovascular;  Laterality: N/A;   TEE WITHOUT CARDIOVERSION N/A 09/18/2017   Procedure: TRANSESOPHAGEAL ECHOCARDIOGRAM (TEE);  Surgeon: Skeet Latch, MD;  Location: [place];  Service: Cardiovascular;  Laterality: N/A;   TEE WITHOUT CARDIOVERSION N/A 01/07/2018   Procedure: TRANSESOPHAGEAL ECHOCARDIOGRAM (TEE);  Surgeon: Rexene Alberts, MD;  Location: [place];  Service: Open Heart Surgery;  Laterality: N/A;   TOTAL KNEE ARTHROPLASTY     Left   TUBAL LIGATION      Family History  Problem Relation Age of Onset   Hypertension Mother    Cancer Sister        leukemia   Diabetes Brother    Stroke Sister    Diabetes Brother    Heart attack Brother    Healthy Daughter    Healthy Son    Healthy Daughter       Controlled substance contract: n/a     Review of Systems  Constitutional:  Negative for diaphoresis.  Eyes:  Negative for pain.  Respiratory:  Negative for shortness of breath.   Cardiovascular:  Negative for chest pain, palpitations and leg swelling.  Gastrointestinal:  Negative for abdominal pain.  Endocrine: Negative for polydipsia.  Skin:  Negative for rash.  Neurological:  Negative for dizziness, weakness and headaches.  Hematological:  Does not bruise/bleed easily.  All other systems reviewed and are negative.      Objective:   Physical Exam Vitals and nursing note reviewed.  Constitutional:      General: She is not in acute distress.    Appearance: Normal appearance. She is well-developed.  HENT:     Head: Normocephalic.     Right Ear: Tympanic  membrane normal.     Left Ear: Tympanic membrane normal.     Nose: Nose normal.     Mouth/Throat:     Mouth: Mucous membranes are moist.  Eyes:     Pupils: Pupils are equal, round, and reactive to light.  Neck:     Vascular: No carotid bruit or JVD.  Cardiovascular:     Rate and Rhythm: Normal rate and regular rhythm.     Heart sounds: Normal heart sounds.  Pulmonary:     Effort: Pulmonary effort is normal. No respiratory distress.     Breath sounds: Normal breath sounds. No wheezing or rales.  Chest:     Chest wall: No tenderness.  Abdominal:     General:  Bowel sounds are normal. There is no distension or abdominal bruit.     Palpations: Abdomen is soft. There is no hepatomegaly, splenomegaly, mass or pulsatile mass.     Tenderness: There is no abdominal tenderness.  Musculoskeletal:        General: Normal range of motion.     Cervical back: Normal range of motion and neck supple.     Right lower leg: Edema (2+) present.     Left lower leg: Edema (2+) present.     Comments: Ing with walker- gait is slow and steady  Lymphadenopathy:     Cervical: No cervical adenopathy.  Skin:    General: Skin is warm and dry.  Neurological:     Mental Status: She is alert and oriented to person, place, and time.     Deep Tendon Reflexes: Reflexes are normal and symmetric.  Psychiatric:        Behavior: Behavior normal.        Thought Content: Thought content normal.        Judgment: Judgment normal.     BP 132/78   Pulse 67   Temp 98.6 F (37 C) (Temporal)   Resp 20   Ht 5' 3"  (1.6 m)   Wt 165 lb (74.8 kg)   LMP 11/05/1992   SpO2 99%   BMI 29.23 kg/m         Assessment & Plan:  Brooke Chavez comes in today with chief complaint of Medical Management of Chronic Issues   Diagnosis and orders addressed:  1. Essential hypertension Lowsodium diet - CBC with Differential/Platelet - CMP14+EGFR - losartan (COZAAR) 100 MG tablet; Take 1 tablet (100 mg total) by mouth daily.  (NEEDS TO BE SEEN BEFORE NEXT REFILL)  Dispense: 90 tablet; Refill: 1 - amLODipine (NORVASC) 5 MG tablet; Take 1 tablet (5 mg total) by mouth daily. Needs office visit for further refills  Dispense: 30 tablet; Refill: 0 - CBC with Differential/Platelet - CMP14+EGFR  2. PAF (paroxysmal atrial fibrillation) (HCC) Reprot any palpitations or heart racing - apixaban (ELIQUIS) 5 MG TABS tablet; Take 1 tablet (5 mg total) by mouth 2 (two) times daily. (NEEDS TO BE SEEN BEFORE NEXT REFILL)  Dispense: 60 tablet; Refill: 0  3. Chronic diastolic congestive heart failure (HCC) Elevate legs when sitting  4. Dysphagia, post-stroke Chew food well  5. Gastroesophageal reflux disease without esophagitis Avoid spicy foods Do not eat 2 hours prior to bedtime  6. 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 PBA (pseudobulbar affect)  8. Stage 3a chronic kidney disease ([place]) Labs pending  9. Hyperlipidemia with target LDL less than 100 Low fat diet - Lipid panel - rosuvastatin (CRESTOR) 10 MG tablet; Take 1 tablet (10 mg total) by mouth daily.  Dispense: 90 tablet; Refill: 3 - Lipid panel  10. BMI 30.0-30.9,adult Discussed diet and exercise for person with BMI >25 Will recheck weight in 3-6 months  11. Peripheral edema Elevate legs when sitting - furosemide (LASIX) 40 MG tablet; Take 1 tablet (40 mg total) by mouth daily.  Dispense: 90 tablet; Refill: 1  12. Primary insomnia Bedtime routine - traZODone (DESYREL) 50 MG tablet; Take 1 tablet (50 mg total) by mouth at bedtime.  Dispense: 90 tablet; Refill: 1   Labs pending Health Maintenance reviewed Diet and exercise encouraged  Follow up plan: 6 months   Mary-Margaret Hassell Done, FNP

## 2022-03-09 LAB — CBC WITH DIFFERENTIAL/PLATELET
Basophils Absolute: 0 10*3/uL (ref 0.0–0.2)
Basos: 0 %
EOS (ABSOLUTE): 0.1 10*3/uL (ref 0.0–0.4)
Eos: 1 %
Hematocrit: 35.5 % (ref 34.0–46.6)
Hemoglobin: 11.2 g/dL (ref 11.1–15.9)
Immature Grans (Abs): 0 10*3/uL (ref 0.0–0.1)
Immature Granulocytes: 0 %
Lymphocytes Absolute: 2.2 10*3/uL (ref 0.7–3.1)
Lymphs: 48 %
MCH: 29.5 pg (ref 26.6–33.0)
MCHC: 31.5 g/dL (ref 31.5–35.7)
MCV: 93 fL (ref 79–97)
Monocytes Absolute: 0.2 10*3/uL (ref 0.1–0.9)
Monocytes: 5 %
Neutrophils Absolute: 2.1 10*3/uL (ref 1.4–7.0)
Neutrophils: 46 %
RBC: 3.8 x10E6/uL (ref 3.77–5.28)
RDW: 14.8 % (ref 11.7–15.4)
WBC: 4.6 10*3/uL (ref 3.4–10.8)

## 2022-03-09 LAB — CMP14+EGFR
ALT: 10 IU/L (ref 0–32)
AST: 24 IU/L (ref 0–40)
Albumin/Globulin Ratio: 1.3 (ref 1.2–2.2)
Albumin: 4.2 g/dL (ref 3.7–4.7)
Alkaline Phosphatase: 79 IU/L (ref 44–121)
BUN/Creatinine Ratio: 10 — ABNORMAL LOW (ref 12–28)
BUN: 15 mg/dL (ref 8–27)
Bilirubin Total: 0.5 mg/dL (ref 0.0–1.2)
CO2: 24 mmol/L (ref 20–29)
Calcium: 9.6 mg/dL (ref 8.7–10.3)
Chloride: 103 mmol/L (ref 96–106)
Creatinine, Ser: 1.51 mg/dL — ABNORMAL HIGH (ref 0.57–1.00)
Globulin, Total: 3.2 g/dL (ref 1.5–4.5)
Glucose: 86 mg/dL (ref 70–99)
Potassium: 4.3 mmol/L (ref 3.5–5.2)
Sodium: 142 mmol/L (ref 134–144)
Total Protein: 7.4 g/dL (ref 6.0–8.5)
eGFR: 35 mL/min/{1.73_m2} — ABNORMAL LOW (ref >59)

## 2022-03-09 LAB — LIPID PANEL
Chol/HDL Ratio: 2.8 ratio (ref 0.0–4.4)
Cholesterol, Total: 178 mg/dL (ref 100–199)
HDL: 64 mg/dL (ref >39)
LDL Chol Calc (NIH): 98 mg/dL (ref 0–99)
Triglycerides: 86 mg/dL (ref 0–149)
VLDL Cholesterol Cal: 16 mg/dL (ref 5–40)

## 2022-03-13 ENCOUNTER — Ambulatory Visit: Payer: Self-pay | Admitting: *Deleted

## 2022-03-13 NOTE — Chronic Care Management (AMB) (Signed)
  Chronic Care Management   Note  03/13/2022 Name: Brooke Chavez MRN: [id number] DOB: 11-23-1940   Due to changes in the Chronic Care Management program, I am removing myself as the RN Care Manager from the Care Team and closing [place]. Patient was not scheduled to be followed by the RN Care Coordination nurse for [facility].   Patient does not have an open Care Plan with another CCM team member. Patient does not have a current CCM referral placed since 12/09/21. CCM enrollment status changed to "not enrolled".   Patient's PCP can place a new referral if the they needs Care Management or Care Coordination services in the future.  Chong Sicilian, BSN, RN-BC Proofreader Dial: [phone]

## 2022-03-13 NOTE — Patient Instructions (Signed)
Brooke Chavez  At some point during the past 4 years, I have worked with you through the [place] Management Program at [place].  Due to program changes I am removing myself from your care team.   If you are currently active with another CCM Team Member, you will remain active with them unless they reach out to you with additional information.   If you feel that you need services in the future,  please talk with your primary care provider and request a new referral for Care Management or Care Coordination services. This does not affect your status as a patient at [place].   Thank you for allowing me to participate in your your healthcare journey.  Chong Sicilian, BSN, RN-BC Proofreader Dial: [phone]

## 2022-03-25 ENCOUNTER — Other Ambulatory Visit: Payer: Self-pay | Admitting: *Deleted

## 2022-03-25 DIAGNOSIS — I10 Essential (primary) hypertension: Secondary | ICD-10-CM

## 2022-03-25 MED ORDER — AMLODIPINE BESYLATE 5 MG PO TABS: 5.0000 mg | ORAL_TABLET | Freq: Every day | ORAL | 1 refills | Status: DC

## 2022-04-12 ENCOUNTER — Other Ambulatory Visit: Payer: Self-pay | Admitting: Nurse Practitioner

## 2022-04-12 DIAGNOSIS — I480 Paroxysmal atrial fibrillation: Secondary | ICD-10-CM

## 2022-06-06 ENCOUNTER — Other Ambulatory Visit: Payer: Self-pay | Admitting: Nurse Practitioner

## 2022-06-06 DIAGNOSIS — I480 Paroxysmal atrial fibrillation: Secondary | ICD-10-CM

## 2022-06-10 ENCOUNTER — Telehealth: Payer: Self-pay | Admitting: Nurse Practitioner

## 2022-06-10 NOTE — Telephone Encounter (Signed)
Daughter notified that when form is received we will fill it out and fax it back. Daughter verbalized understanding

## 2022-08-14 ENCOUNTER — Inpatient Hospital Stay: Payer: Medicare Other | Attending: Oncology | Admitting: Oncology

## 2022-08-14 VITALS — BP 161/79 | HR 76 | Temp 98.2°F | Resp 18 | Ht 63.0 in | Wt 170.0 lb

## 2022-08-14 DIAGNOSIS — K7689 Other specified diseases of liver: Secondary | ICD-10-CM | POA: Insufficient documentation

## 2022-08-14 DIAGNOSIS — C7A8 Other malignant neuroendocrine tumors: Secondary | ICD-10-CM | POA: Diagnosis not present

## 2022-08-14 DIAGNOSIS — I4892 Unspecified atrial flutter: Secondary | ICD-10-CM | POA: Diagnosis not present

## 2022-08-14 DIAGNOSIS — I10 Essential (primary) hypertension: Secondary | ICD-10-CM | POA: Insufficient documentation

## 2022-08-14 DIAGNOSIS — N289 Disorder of kidney and ureter, unspecified: Secondary | ICD-10-CM | POA: Diagnosis not present

## 2022-08-14 DIAGNOSIS — K8689 Other specified diseases of pancreas: Secondary | ICD-10-CM | POA: Diagnosis not present

## 2022-08-14 NOTE — Progress Notes (Signed)
  [facility] OFFICE PROGRESS NOTE   Diagnosis: Pancreas neuroendocrine tumor  INTERVAL HISTORY:   Ms. Foskett returns as scheduled.  She is here with her daughter.  No complaint.  Good appetite.  No diarrhea or flushing.  No pain.  She goes to an adult daycare 2 days/week.  Objective:  Vital signs in last 24 hours:  Blood pressure (!) 161/79, pulse 76, temperature 98.2 F (36.8 C), temperature source Oral, resp. rate 18, height '5\' 3"'$  (1.6 m), weight 170 lb (77.1 kg), last menstrual period 11/05/1992, SpO2 93 %.    Lymphatics: No cervical, supraclavicular, axillary, or inguinal nodes Resp: Lungs clear bilaterally, no respiratory distress Cardio: Irregular GI: Nontender, no mass, no hepatosplenomegaly Vascular: No leg edema   Portacath/PICC-without erythema  Lab Results:  Lab Results  Component Value Date   WBC 4.6 03/07/2022   HGB 11.2 03/07/2022   HCT 35.5 03/07/2022   MCV 93 03/07/2022   PLT CANCELED 03/07/2022   NEUTROABS 2.1 03/07/2022    CMP  Lab Results  Component Value Date   NA 142 03/07/2022   K 4.3 03/07/2022   CL 103 03/07/2022   CO2 24 03/07/2022   GLUCOSE 86 03/07/2022   BUN 15 03/07/2022   CREATININE 1.51 (H) 03/07/2022   CALCIUM 9.6 03/07/2022   PROT 7.4 03/07/2022   ALBUMIN 4.2 03/07/2022   AST 24 03/07/2022   ALT 10 03/07/2022   ALKPHOS 79 03/07/2022   BILITOT 0.5 03/07/2022   GFRNONAA 24 (L) 05/14/2020   GFRAA 28 (L) 05/14/2020     Medications: I have reviewed the patient's current medications.   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 and multiple enhancing liver lesions noted on CT 11/13/2017 MRI 11/21/2017-hypervascular liver lesions evident on arterial phase, pancreas lesion not identified [place] 11/30/2017- intense uptake at the pancreas tail lesion consistent with a primary neuroendocrine neoplasm, no uptake in the liver above background CT 06/29/2019-unchanged pancreas tail  lesion multiple low-attenuation liver lesions-largest lesions were characterized as cysts and hemangiomata by MRI, small hyperenhancing lesion seen by MRI are not appreciated by CT MRI abdomen 06/28/2020-stable hepatic cysts, no visible pancreas lesion-study limited by respiratory motion, no ductal dilatation, cardiac enlargement CT abdomen/pelvis 11/27/2020-previously seen tiny hyper vascular lesion in pancreatic tail not visualized on current unenhanced exam.  Stable small hepatic cysts.  No new or enlarging liver lesions seen. 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 Vazguez has a history clinical history consistent with a pancreas neuroendocrine tumor.  There is no clinical or radiologic evidence of disease progression to date.  She will undergo a restaging CT abdomen in April.  She will return for an office visit in 1 year.  Ms. Hem[facility] or her daughter will contact us in the interim for new symptoms. Betsy Coder, MD  08/14/2022  10:34 AM

## 2022-09-08 ENCOUNTER — Telehealth: Payer: Self-pay | Admitting: Nurse Practitioner

## 2022-09-08 ENCOUNTER — Ambulatory Visit: Payer: PRIVATE HEALTH INSURANCE | Admitting: Nurse Practitioner

## 2022-09-08 NOTE — Telephone Encounter (Signed)
Appointment rescheduled for 2/9 at 4:15 with [name], [place].

## 2022-09-09 ENCOUNTER — Other Ambulatory Visit: Payer: Self-pay | Admitting: Nurse Practitioner

## 2022-09-09 DIAGNOSIS — I480 Paroxysmal atrial fibrillation: Secondary | ICD-10-CM

## 2022-09-09 NOTE — Telephone Encounter (Signed)
Fax from [email or web address] Request form for Eliquis script for pt Pt does not have this pharmacy in her list, has not used them before or given [place] permission to send to them. She uses the local [employer] [facility] fax back with denied written on it.

## 2022-09-19 ENCOUNTER — Ambulatory Visit (INDEPENDENT_AMBULATORY_CARE_PROVIDER_SITE_OTHER): Payer: Medicare Other | Admitting: Nurse Practitioner

## 2022-09-19 ENCOUNTER — Encounter: Payer: Self-pay | Admitting: Nurse Practitioner

## 2022-09-19 VITALS — BP 175/76 | HR 67 | Temp 97.7°F | Resp 20 | Ht 63.0 in | Wt 170.0 lb

## 2022-09-19 DIAGNOSIS — I69391 Dysphagia following cerebral infarction: Secondary | ICD-10-CM

## 2022-09-19 DIAGNOSIS — N1831 Chronic kidney disease, stage 3a: Secondary | ICD-10-CM | POA: Diagnosis not present

## 2022-09-19 DIAGNOSIS — I5032 Chronic diastolic (congestive) heart failure: Secondary | ICD-10-CM | POA: Diagnosis not present

## 2022-09-19 DIAGNOSIS — M810 Age-related osteoporosis without current pathological fracture: Secondary | ICD-10-CM | POA: Diagnosis not present

## 2022-09-19 DIAGNOSIS — I10 Essential (primary) hypertension: Secondary | ICD-10-CM | POA: Diagnosis not present

## 2022-09-19 DIAGNOSIS — Z6830 Body mass index (BMI) 30.0-30.9, adult: Secondary | ICD-10-CM

## 2022-09-19 DIAGNOSIS — I6930 Unspecified sequelae of cerebral infarction: Secondary | ICD-10-CM

## 2022-09-19 DIAGNOSIS — I130 Hypertensive heart and chronic kidney disease with heart failure and stage 1 through stage 4 chronic kidney disease, or unspecified chronic kidney disease: Secondary | ICD-10-CM

## 2022-09-19 DIAGNOSIS — I69398 Other sequelae of cerebral infarction: Secondary | ICD-10-CM | POA: Diagnosis not present

## 2022-09-19 DIAGNOSIS — R269 Unspecified abnormalities of gait and mobility: Secondary | ICD-10-CM

## 2022-09-19 DIAGNOSIS — I69351 Hemiplegia and hemiparesis following cerebral infarction affecting right dominant side: Secondary | ICD-10-CM

## 2022-09-19 DIAGNOSIS — F482 Pseudobulbar affect: Secondary | ICD-10-CM | POA: Diagnosis not present

## 2022-09-19 DIAGNOSIS — I480 Paroxysmal atrial fibrillation: Secondary | ICD-10-CM

## 2022-09-19 DIAGNOSIS — F5101 Primary insomnia: Secondary | ICD-10-CM

## 2022-09-19 DIAGNOSIS — I69320 Aphasia following cerebral infarction: Secondary | ICD-10-CM

## 2022-09-19 DIAGNOSIS — I7409 Other arterial embolism and thrombosis of abdominal aorta: Secondary | ICD-10-CM | POA: Diagnosis not present

## 2022-09-19 DIAGNOSIS — E785 Hyperlipidemia, unspecified: Secondary | ICD-10-CM

## 2022-09-19 DIAGNOSIS — R609 Edema, unspecified: Secondary | ICD-10-CM

## 2022-09-19 MED ORDER — ROSUVASTATIN CALCIUM 10 MG PO TABS: 10.0000 mg | ORAL_TABLET | Freq: Every day | ORAL | 3 refills | Status: DC

## 2022-09-19 MED ORDER — LOSARTAN POTASSIUM 100 MG PO TABS: 100.0000 mg | ORAL_TABLET | Freq: Every day | ORAL | 1 refills | Status: DC

## 2022-09-19 MED ORDER — APIXABAN 5 MG PO TABS
5.0000 mg | ORAL_TABLET | Freq: Two times a day (BID) | ORAL | 5 refills | Status: DC
Start: 2022-09-19 — End: 2023-04-10

## 2022-09-19 MED ORDER — AMLODIPINE BESYLATE 5 MG PO TABS: 5.0000 mg | ORAL_TABLET | Freq: Every day | ORAL | 1 refills | Status: DC

## 2022-09-19 MED ORDER — TRAZODONE HCL 50 MG PO TABS: 50.0000 mg | ORAL_TABLET | Freq: Every day | ORAL | 1 refills | Status: DC

## 2022-09-19 MED ORDER — FUROSEMIDE 40 MG PO TABS: 40.0000 mg | ORAL_TABLET | Freq: Every day | ORAL | 1 refills | Status: DC

## 2022-09-19 NOTE — Progress Notes (Signed)
Subjective:    Patient ID: Brooke Chavez, female    DOB: [birth year], 82 y.o.   MRN: [id number]   Chief Complaint: medical management of chronic issues     HPI:  Brooke Chavez is a 82 y.o. who identifies as a female who was assigned female at birth.   Social history: Lives with: daughters Work history: disabliity from [place] in today for follow up of the following chronic medical issues:  1. Essential hypertension No c/o chest  pain, sob or headache. Her family does not check her blood pressure at home. BP Readings from Last 3 Encounters:  08/14/22 (!) 161/79  03/07/22 132/78  12/25/21 (!) 142/61     2. Aortoiliac occlusive disease ([place]) 3. Chronic diastolic congestive heart failure ([place]) 4. PAF (paroxysmal atrial fibrillation) ([facility]) Denies palpitations or heart racing. She has last seen cardiology 08/29/21. She was doing well at that time and no changes were made  to plan of care. She was to follow up in 1 year.  5. Late effect of cerebrovascular accident (CVA) 6. Dysphagia, post-stroke 7. PBA (pseudobulbar affect) 8. Hemiparesis of right dominant side as late effect of cerebral infarction ([place]) 9. 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 Gait disturbance, post-stroke Patient is doing quite well since her stroke. Her dysphagia has greatly improved. She is on meds for her PBA and no longer cries as often as she did. Speech is understandable at times. She has to have help when walking and will use her walker as needed. She has not had any recent falls  to speak of. She had some PT in 2022 to increase her strength and family said it did help. But patient does not do much at home on her own so weakness is still an issue.  11. Hyperlipidemia with target LDL less than 100 Family tries to get her to eat healthy. Lab Results  Component Value Date   CHOL 178 03/07/2022   HDL 64 03/07/2022   LDLCALC 98 03/07/2022   TRIG  86 03/07/2022   CHOLHDL 2.8 03/07/2022     12. Stage 3a chronic kidney disease ([place]) Lab Results  Component Value Date   CREATININE 1.51 (H) 03/07/2022     13. Age-related osteoporosis without current pathological fracture Last dexascan was done in 2016 and family no longer wishes to repeat this.  14. BMI 30.0-30.9,adult No recent weight changes. Wt Readings from Last 3 Encounters:  09/19/22 170 lb (77.1 kg)  08/14/22 170 lb (77.1 kg)  03/07/22 165 lb (74.8 kg)   BMI Readings from Last 3 Encounters:  09/19/22 30.11 kg/m  08/14/22 30.11 kg/m  03/07/22 29.23 kg/m     New complaints: Patient was discovered to have a pancreatic mass in 08/2021 but did not feel like this was a neoplasm.Suggest repeat CT scan in 1 year. They ended up repeat in 11/2021 which showed no change at that time. She had oncology followup 08/2022 and they are going to repeat CT scan in APril.  Allergies  Allergen Reactions   Oxycodone-Acetaminophen Anxiety and Other (See Comments)    Hallucinations   Aspirin Nausea And Vomiting and Nausea Only    Other reaction(s): GI Upset (intolerance)   Outpatient Encounter Medications as of 09/19/2022  Medication Sig   acetaminophen (TYLENOL) 325 MG tablet Take 2 tablets (650 mg total) by mouth every 6 (six) hours as needed for mild pain (or Fever >/= 101).   amLODipine (NORVASC)  5 MG tablet Take 1 tablet (5 mg total) by mouth daily.   ELIQUIS 5 MG TABS tablet Take 1 tablet by mouth twice daily   furosemide (LASIX) 40 MG tablet Take 1 tablet (40 mg total) by mouth daily.   losartan (COZAAR) 100 MG tablet Take 1 tablet (100 mg total) by mouth daily. (NEEDS TO BE SEEN BEFORE NEXT REFILL)   rosuvastatin (CRESTOR) 10 MG tablet Take 1 tablet (10 mg total) by mouth daily.   traZODone (DESYREL) 50 MG tablet Take 1 tablet (50 mg total) by mouth at bedtime.   No facility-administered encounter medications on file as of 09/19/2022.    Past Surgical History:  Procedure  Laterality Date   A FLUTTER ABLATION N/A 06/27/2011   Procedure: ABLATION A FLUTTER;  Surgeon: Thompson Grayer, MD;  [facility]: [facility] CATH LAB;  Service: Cardiovascular;  Laterality: N/A;   ATRIAL ABLATION SURGERY  06/2010   HIP SURGERY     Left (fracture) 3/13   JOINT REPLACEMENT     both knees    KNEE ARTHROSCOPY     Right   LUMBAR SPINE SURGERY     MITRAL VALVE REPAIR Right 01/07/2018   Procedure: MINIMALLY INVASIVE MITRAL VALVE REPAIR using LivaNova ring size 28 MM;  Surgeon: Rexene Alberts, MD;  Location: [facility];  Service: Open Heart Surgery;  Laterality: Right;   PATENT FORAMEN OVALE(PFO) CLOSURE N/A 01/07/2018   Procedure: PATENT FORAMEN OVALE (PFO) CLOSURE;  Surgeon: Rexene Alberts, MD;  Location: [facility];  Service: Open Heart Surgery;  Laterality: N/A;   RIGHT/LEFT HEART CATH AND CORONARY ANGIOGRAPHY N/A 11/11/2017   Procedure: RIGHT/LEFT HEART CATH AND CORONARY ANGIOGRAPHY;  Surgeon: Sherren Mocha, MD;  Location: [facility] CV LAB;  Service: Cardiovascular;  Laterality: N/A;   TEE WITHOUT CARDIOVERSION N/A 09/18/2017   Procedure: TRANSESOPHAGEAL ECHOCARDIOGRAM (TEE);  Surgeon: Skeet Latch, MD;  Location: [facility];  Service: Cardiovascular;  Laterality: N/A;   TEE WITHOUT CARDIOVERSION N/A 01/07/2018   Procedure: TRANSESOPHAGEAL ECHOCARDIOGRAM (TEE);  Surgeon: Rexene Alberts, MD;  Location: [facility];  Service: Open Heart Surgery;  Laterality: N/A;   TOTAL KNEE ARTHROPLASTY     Left   TUBAL LIGATION      Family History  Problem Relation Age of Onset   Hypertension Mother    Cancer Sister        leukemia   Diabetes Brother    Stroke Sister    Diabetes Brother    Heart attack Brother    Healthy Daughter    Healthy Son    Healthy Daughter       Controlled substance contract: n/a     Review of Systems  Constitutional:  Negative for diaphoresis.  Eyes:  Negative for pain.  Respiratory:  Negative for shortness of breath.   Cardiovascular:  Negative for chest pain,  palpitations and leg swelling.  Gastrointestinal:  Negative for abdominal pain.  Endocrine: Negative for polydipsia.  Skin:  Negative for rash.  Neurological:  Negative for dizziness, weakness and headaches.  Hematological:  Does not bruise/bleed easily.  All other systems reviewed and are negative.      Objective:   Physical Exam Vitals and nursing note reviewed.  Constitutional:      General: She is not in acute distress.    Appearance: Normal appearance. She is well-developed.  HENT:     Head: Normocephalic.     Right Ear: Tympanic membrane normal.     Left Ear: Tympanic membrane normal.  Nose: Nose normal.     Mouth/Throat:     Mouth: Mucous membranes are moist.  Eyes:     Pupils: Pupils are equal, round, and reactive to light.  Neck:     Vascular: No carotid bruit or JVD.  Cardiovascular:     Rate and Rhythm: Normal rate and regular rhythm.     Heart sounds: Normal heart sounds.  Pulmonary:     Effort: Pulmonary effort is normal. No respiratory distress.     Breath sounds: Normal breath sounds. No wheezing or rales.  Chest:     Chest wall: No tenderness.  Abdominal:     General: Bowel sounds are normal. There is no distension or abdominal bruit.     Palpations: Abdomen is soft. There is no hepatomegaly, splenomegaly, mass or pulsatile mass.     Tenderness: There is no abdominal tenderness.  Musculoskeletal:        General: Normal range of motion.     Cervical back: Normal range of motion and neck supple.     Comments: Right side upper and lower ext weakness Walking with walker  Lymphadenopathy:     Cervical: No cervical adenopathy.  Skin:    General: Skin is warm and dry.  Neurological:     Mental Status: She is alert and oriented to person, place, and time.     Deep Tendon Reflexes: Reflexes are normal and symmetric.  Psychiatric:        Behavior: Behavior normal.        Thought Content: Thought content normal.        Judgment: Judgment normal.      Comments: Speech slurred    BP (!) 175/76   Pulse 67   Temp 97.7 F (36.5 C) (Temporal)   Resp 20   Ht 5' 3"$  (1.6 m)   Wt 170 lb (77.1 kg)   LMP 11/05/1992   SpO2 94%   BMI 30.11 kg/m         Assessment & Plan:  Patience SARAIYAH SALVATORI comes in today with chief complaint of Medical Management of Chronic Issues   Diagnosis and orders addressed:  1. Essential hypertension Low sodium diet - amLODipine (NORVASC) 5 MG tablet; Take 1 tablet (5 mg total) by mouth daily.  Dispense: 90 tablet; Refill: 1 - losartan (COZAAR) 100 MG tablet; Take 1 tablet (100 mg total) by mouth daily. (NEEDS TO BE SEEN BEFORE NEXT REFILL)  Dispense: 90 tablet; Refill: 1  2. Aortoiliac occlusive disease ([place]) 3. Chronic diastolic congestive heart failure ([place]) 4. PAF (paroxysmal atrial fibrillation) ([place]) Follow up with cardiology - apixaban (ELIQUIS) 5 MG TABS tablet; Take 1 tablet (5 mg total) by mouth 2 (two) times daily.  Dispense: 60 tablet; Refill: 5  5. Late effect of cerebrovascular accident (CVA) Fall precautions  6. Dysphagia, post-stroke Chew food well  7. PBA (pseudobulbar affect)  8. 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 Gait disturbance, post-stroke Fall prevention  11. Hyperlipidemia with target LDL less than 100 Low fat diet - rosuvastatin (CRESTOR) 10 MG tablet; Take 1 tablet (10 mg total) by mouth daily.  Dispense: 90 tablet; Refill: 3  12. Stage 3a chronic kidney disease ([place]) Labs pending  13. Age-related osteoporosis without current pathological fracture  14. BMI 30.0-30.9,adult Discussed diet and exercise for person with BMI >25 Will recheck weight in 3-6 months  15. Peripheral  edema Elevate legs when sitting - furosemide (LASIX) 40 MG tablet; Take 1 tablet (40 mg total) by mouth daily.  Dispense: 90 tablet; Refill: 1  16.  Primary insomnia Bedtime routine - traZODone (DESYREL) 50 MG tablet; Take 1 tablet (50 mg total) by mouth at bedtime.  Dispense: 90 tablet; Refill: 1   Labs pending Health Maintenance reviewed Diet and exercise encouraged  Follow up plan: 6 months   Mary-Margaret Hassell Done, FNP

## 2022-09-19 NOTE — Addendum Note (Signed)
Addended by: Rolena Infante on: 09/19/2022 05:07 PM   Modules accepted: Orders

## 2022-09-22 LAB — CBC WITH DIFFERENTIAL/PLATELET
Basophils Absolute: 0 10*3/uL (ref 0.0–0.2)
Basos: 0 %
EOS (ABSOLUTE): 0 10*3/uL (ref 0.0–0.4)
Eos: 1 %
Hematocrit: 36.9 % (ref 34.0–46.6)
Hemoglobin: 12.4 g/dL (ref 11.1–15.9)
Immature Grans (Abs): 0 10*3/uL (ref 0.0–0.1)
Immature Granulocytes: 1 %
Lymphocytes Absolute: 2 10*3/uL (ref 0.7–3.1)
Lymphs: 48 %
MCH: 31.1 pg (ref 26.6–33.0)
MCHC: 33.6 g/dL (ref 31.5–35.7)
MCV: 93 fL (ref 79–97)
Monocytes Absolute: 0.3 10*3/uL (ref 0.1–0.9)
Monocytes: 6 %
Neutrophils Absolute: 1.8 10*3/uL (ref 1.4–7.0)
Neutrophils: 44 %
Platelets: 114 10*3/uL — ABNORMAL LOW (ref 150–450)
RBC: 3.99 x10E6/uL (ref 3.77–5.28)
RDW: 14.1 % (ref 11.7–15.4)
WBC: 4 10*3/uL (ref 3.4–10.8)

## 2022-09-22 LAB — CMP14+EGFR
ALT: 19 IU/L (ref 0–32)
AST: 28 IU/L (ref 0–40)
Albumin/Globulin Ratio: 1.3 (ref 1.2–2.2)
Albumin: 4.5 g/dL (ref 3.7–4.7)
Alkaline Phosphatase: 100 IU/L (ref 44–121)
BUN/Creatinine Ratio: 10 — ABNORMAL LOW (ref 12–28)
BUN: 15 mg/dL (ref 8–27)
Bilirubin Total: 0.7 mg/dL (ref 0.0–1.2)
CO2: 21 mmol/L (ref 20–29)
Calcium: 9.9 mg/dL (ref 8.7–10.3)
Chloride: 100 mmol/L (ref 96–106)
Creatinine, Ser: 1.44 mg/dL — ABNORMAL HIGH (ref 0.57–1.00)
Globulin, Total: 3.6 g/dL (ref 1.5–4.5)
Glucose: 66 mg/dL — ABNORMAL LOW (ref 70–99)
Potassium: 4.4 mmol/L (ref 3.5–5.2)
Sodium: 142 mmol/L (ref 134–144)
Total Protein: 8.1 g/dL (ref 6.0–8.5)
eGFR: 36 mL/min/{1.73_m2} — ABNORMAL LOW (ref >59)

## 2022-09-22 LAB — LIPID PANEL
Chol/HDL Ratio: 2.7 ratio (ref 0.0–4.4)
Cholesterol, Total: 190 mg/dL (ref 100–199)
HDL: 70 mg/dL (ref >39)
LDL Chol Calc (NIH): 100 mg/dL — ABNORMAL HIGH (ref 0–99)
Triglycerides: 113 mg/dL (ref 0–149)
VLDL Cholesterol Cal: 20 mg/dL (ref 5–40)

## 2022-10-01 ENCOUNTER — Ambulatory Visit (INDEPENDENT_AMBULATORY_CARE_PROVIDER_SITE_OTHER): Payer: Medicare Other

## 2022-10-01 VITALS — Ht 63.0 in | Wt 170.0 lb

## 2022-10-01 DIAGNOSIS — Z780 Asymptomatic menopausal state: Secondary | ICD-10-CM

## 2022-10-01 DIAGNOSIS — Z0100 Encounter for examination of eyes and vision without abnormal findings: Secondary | ICD-10-CM

## 2022-10-01 DIAGNOSIS — Z0000 Encounter for general adult medical examination without abnormal findings: Secondary | ICD-10-CM

## 2022-10-01 NOTE — Progress Notes (Signed)
Subjective:   Brooke Chavez is a 82 y.o. female who presents for Medicare Annual (Subsequent) preventive examination. I connected with  Brooke Chavez on 10/01/22 by a audio enabled telemedicine application and verified that I am speaking with the correct person using two identifiers.  Patient Location: Home  Provider Location: Home Office  I discussed the limitations of evaluation and management by telemedicine. The patient expressed understanding and agreed to proceed.  Review of Systems           Objective:    There were no vitals filed for this visit. There is no height or weight on file to calculate BMI.     08/14/2022   10:19 AM 08/29/2021    4:57 PM 08/14/2021   10:00 AM 12/12/2020   10:45 PM 08/14/2020   12:35 PM 12/27/2019    3:03 PM 03/13/2018    6:22 PM  Advanced Directives  Does Patient Have a Medical Advance Directive? Yes Yes Yes Yes Yes Yes No  Type of Paramedic of [place];Living will [place];Living will [place];Living will;Out of facility DNR (pink MOST or yellow form) [place];Living will [place];Living will [place];Living will   Does patient want to make changes to medical advance directive? No - Patient declined  No - Patient declined  No - Patient declined No - Patient declined   Copy of [place] in Chart? Yes - validated most recent copy scanned in chart (See row information) Yes - validated most recent copy scanned in chart (See row information)   Yes - validated most recent copy scanned in chart (See row information) Yes - validated most recent copy scanned in chart (See row information)   Would patient like information on creating a medical advance directive?   No - Patient declined        Current Medications (verified) Outpatient Encounter Medications as of 10/01/2022  Medication Sig   acetaminophen (TYLENOL) 325 MG  tablet Take 2 tablets (650 mg total) by mouth every 6 (six) hours as needed for mild pain (or Fever >/= 101).   amLODipine (NORVASC) 5 MG tablet Take 1 tablet (5 mg total) by mouth daily.   apixaban (ELIQUIS) 5 MG TABS tablet Take 1 tablet (5 mg total) by mouth 2 (two) times daily.   furosemide (LASIX) 40 MG tablet Take 1 tablet (40 mg total) by mouth daily.   losartan (COZAAR) 100 MG tablet Take 1 tablet (100 mg total) by mouth daily. (NEEDS TO BE SEEN BEFORE NEXT REFILL)   rosuvastatin (CRESTOR) 10 MG tablet Take 1 tablet (10 mg total) by mouth daily.   traZODone (DESYREL) 50 MG tablet Take 1 tablet (50 mg total) by mouth at bedtime.   No facility-administered encounter medications on file as of 10/01/2022.    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  LMCA stroke post MV repair  01/24/18 (INR 1.7)   Liver masses 11/13/2017   Multiple small nodules seen on CT and MRI   Mitral regurgitation    severe   Pancreatic mass 11/13/2017   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   Complex valvuloplasty including artificial Gore-tex neochord placement x6 and 28 mm Sorin Memo 4D ring annuloplasty via right mini thoracotomy approach   Stroke ([place])    TR (tricuspid regurgitation)    Mild with RA enlargment   Past Surgical History:  Procedure Laterality Date   A FLUTTER ABLATION N/A 06/27/2011   Procedure: ABLATION A FLUTTER;  Surgeon: Thompson Grayer, MD;  Location: [facility] CATH LAB;  Service:  Cardiovascular;  Laterality: N/A;   ATRIAL ABLATION SURGERY  06/2010   HIP SURGERY     Left (fracture) 3/13   JOINT REPLACEMENT     both knees    KNEE ARTHROSCOPY     Right   LUMBAR SPINE SURGERY     MITRAL VALVE REPAIR Right 01/07/2018   Procedure: MINIMALLY INVASIVE MITRAL VALVE REPAIR using LivaNova ring size 28 MM;  Surgeon: Rexene Alberts, MD;  Location: [facility];  Service: Open Heart Surgery;  Laterality: Right;   PATENT FORAMEN OVALE(PFO) CLOSURE N/A 01/07/2018   Procedure: PATENT FORAMEN OVALE (PFO) CLOSURE;  Surgeon: Rexene Alberts, MD;  Location: [facility];  Service: Open Heart Surgery;  Laterality: N/A;   RIGHT/LEFT HEART CATH AND CORONARY ANGIOGRAPHY N/A 11/11/2017   Procedure: RIGHT/LEFT HEART CATH AND CORONARY ANGIOGRAPHY;  Surgeon: Sherren Mocha, MD;  Location: [facility] CV LAB;  Service: Cardiovascular;  Laterality: N/A;   TEE WITHOUT CARDIOVERSION N/A 09/18/2017   Procedure: TRANSESOPHAGEAL ECHOCARDIOGRAM (TEE);  Surgeon: Skeet Latch, MD;  Location: [facility];  Service: Cardiovascular;  Laterality: N/A;   TEE WITHOUT CARDIOVERSION N/A 01/07/2018   Procedure: TRANSESOPHAGEAL ECHOCARDIOGRAM (TEE);  Surgeon: Rexene Alberts, MD;  Location: [facility];  Service: Open Heart Surgery;  Laterality: N/A;   TOTAL KNEE ARTHROPLASTY     Left   TUBAL LIGATION     Family History  Problem Relation Age of Onset   Hypertension Mother    Cancer Sister        leukemia   Diabetes Brother    Stroke Sister    Diabetes Brother    Heart attack Brother    Healthy Daughter    Healthy Son    Healthy Daughter    Social History   Socioeconomic History   Marital status: Widowed    Spouse name: Not on file   Number of children: 3   Years of education: 61   Highest education level: Not on file  Occupational History    Comment: na  Tobacco Use   Smoking status: Never   Smokeless tobacco: Never  Vaping Use   Vaping Use: Never used  Substance and Sexual Activity   Alcohol use: No    Drug use: No   Sexual activity: Never  Other Topics Concern   Not on file  Social History Narrative   Her daughter lives with her - Dillard [place]   Patient unable to talk   Little caffeine   Social Determinants of Health   Financial Resource Strain: Low Risk  (10/21/2021)   Overall Financial Resource Strain (CARDIA)    Difficulty of Paying Living Expenses: Not very hard  Recent Concern: Financial Resource Strain - Medium Risk (08/29/2021)   Overall Financial Resource Strain (CARDIA)    Difficulty of Paying Living Expenses:  Somewhat hard  Food Insecurity: No Food Insecurity (10/21/2021)   Hunger Vital Sign    Worried About Running Out of Food in the Last Year: Never true    Ran Out of Food in the Last Year: Never true  Transportation Needs: No Transportation Needs (10/21/2021)   PRAPARE - Hydrologist (Medical): No    Lack of Transportation (Non-Medical): No  Physical Activity: Insufficiently Active (08/29/2021)   Exercise Vital Sign    Days of Exercise per Week: 7 days    Minutes of Exercise per Session: 10 min  Stress: No Stress Concern Present (08/29/2021)   [place]    Feeling of Stress : Only a little  Social Connections: Moderately Integrated (08/29/2021)   Social Connection and Isolation Panel [NHANES]    Frequency of Communication with Friends and Family: More than three times a week    Frequency of Social Gatherings with Friends and Family: More than three times a week    Attends Religious Services: 1 to 4 times per year    Active Member of [employer] or Organizations: Yes    Attends Archivist Meetings: 1 to 4 times per year    Marital Status: Widowed    Tobacco Counseling Counseling given: Not Answered   Clinical Intake:                 Diabetic?no          Activities of Daily Living     No data to display          Patient Care Team: [name], FNP as PCP - General (Nurse Practitioner) [name], MD as PCP - Cardiology (Cardiology)  Indicate any recent Medical Services you may have received from other than [facility] providers in the past year (date may be approximate).     Assessment:   This is a routine wellness examination for Brooke Chavez.  Hearing/Vision screen No results found.  Dietary issues and exercise activities discussed:     Goals Addressed   None    Depression Screen    09/19/2022    4:39 PM 03/07/2022   12:25 PM 08/29/2021    4:55 PM 08/26/2021    3:28 PM 11/28/2020    4:30 PM 11/06/2020    3:10 PM 05/14/2020    4:13 PM  PHQ 2/9 Scores  PHQ - 2 Score 0 0 1 1 0 0 0  PHQ- 9 Score 0 0 3 2       Fall Risk    09/19/2022    4:39 PM 03/07/2022   12:25 PM 10/21/2021    2:04 PM 08/29/2021    4:58 PM 08/26/2021    3:28 PM  Fall Risk   Falls in the past year? 0 0 1 1 0  Number falls in past yr:   1 1   Injury with Fall?   0 0   Risk for fall due to :   History of fall(s);Impaired balance/gait;Mental status change History of fall(s);Impaired balance/gait;Orthopedic patient;Mental status change   Follow up   Falls prevention discussed;Education provided Education provided;Falls prevention discussed     FALL RISK PREVENTION PERTAINING TO THE HOME:  Any stairs in or around the home? No  If so, are there any without handrails? No  Home free of loose throw rugs in walkways, pet beds, electrical cords, etc? Yes  Adequate lighting in your home to reduce risk of falls? Yes  ASSISTIVE DEVICES UTILIZED TO PREVENT FALLS:  Life alert? No  Use of a cane, walker or w/c? No  Grab bars in the bathroom? Yes  Shower chair or bench in shower? Yes  Elevated toilet seat or a handicapped toilet? Yes       08/29/2021    5:02 PM 05/19/2017   11:57 AM 07/25/2015    9:07 AM  MMSE - Mini Mental State Exam  Not completed: Unable to complete;Refused    Orientation to time  4 5  Orientation to Place  5 5   Registration  3 3  Attention/ Calculation  5 5  Recall  3 3  Language- name 2 objects  2 2  Language- repeat  1 1  Language- follow 3 step command  3 3  Language- read & follow direction  1 1  Write a sentence  1 1  Copy design  1 1  Total score  29 30        Immunizations Immunization History  Administered Date(s) Administered   PFIZER(Purple Top)SARS-COV-2 Vaccination 09/02/2019, 09/23/2019, 07/06/2020   PPD Test 01/11/2020   Pneumococcal Polysaccharide-23 04/29/2011   Tdap 04/29/2011    TDAP status: Due, Education has been provided regarding the importance of this vaccine. Advised may receive this vaccine at local pharmacy or Health Dept. Aware to provide a copy of the vaccination record if obtained from local pharmacy or Health Dept. Verbalized acceptance and understanding.  Flu Vaccine status: Due, Education has been provided regarding the importance of this vaccine. Advised may receive this vaccine at local pharmacy or Health Dept. Aware to provide a copy of the vaccination record if obtained from local pharmacy or Health Dept. Verbalized acceptance and understanding.  Pneumococcal vaccine status: Up to date  Covid-19 vaccine status: Completed vaccines  Qualifies for Shingles Vaccine? Yes   Zostavax completed No   Shingrix Completed?: No.    Education has been provided regarding the importance of this vaccine. Patient has been advised to call insurance company to determine out of pocket expense if they have not yet received this vaccine. Advised may also receive vaccine at local pharmacy or Health Dept. Verbalized acceptance and understanding.  Screening Tests Health Maintenance  Topic Date Due   PAP SMEAR-Modifier  09/19/2016   DTaP/Tdap/Td (2 - Td or Tdap) 04/28/2021   Medicare Annual Wellness (AWV)  08/29/2022   COVID-19 Vaccine (4 - 2023-24 season) 10/05/2022 (Originally 04/11/2022)   Zoster Vaccines- Shingrix (1 of 2) 12/18/2022 (Originally 08/29/1990)   INFLUENZA  VACCINE  08/12/2023 (Originally 03/11/2022)   Pneumonia Vaccine 34+ Years old (2 of 2 - PCV) 09/20/2023 (Originally 04/28/2012)   DEXA SCAN  09/20/2023 (Originally 07/12/2017)   HPV VACCINES  Aged Out    Health Maintenance  Health Maintenance Due  Topic Date Due   PAP SMEAR-Modifier  09/19/2016   DTaP/Tdap/Td (2 - Td or Tdap) 04/28/2021   Medicare Annual Wellness (AWV)  08/29/2022    Colorectal cancer screening: No longer required.   Mammogram status: No longer required due to age .  Bone Density status: Ordered 10/01/2022. Pt provided with contact info and advised to call to schedule appt.  Lung Cancer Screening: (Low Dose CT Chest recommended if Age 71-80 years, 30 pack-year currently smoking OR have quit w/in 15years.) does not qualify.   Lung Cancer Screening Referral: n/a  Additional Screening:  Hepatitis C Screening: does not qualify;   Vision Screening: Recommended annual ophthalmology exams for early detection of glaucoma and other disorders of the  eye. Is the patient up to date with their annual eye exam?  No  Who is the provider or what is the name of the office in which the patient attends annual eye exams? None , referral 10/01/2022 If pt is not established with a provider, would they like to be referred to a provider to establish care? No .   Dental Screening: Recommended annual dental exams for proper oral hygiene  Community Resource Referral / Chronic Care Management: CRR required this visit?  No   CCM required this visit?  No      Plan:     I have personally reviewed and noted the following in the patient's chart:   Medical and social history Use of alcohol, tobacco or illicit drugs  Current medications and supplements including opioid prescriptions. Patient is not currently taking opioid prescriptions. Functional ability and status Nutritional status Physical activity Advanced directives List of other physicians Hospitalizations, surgeries, and ER  visits in previous 12 months Vitals Screenings to include cognitive, depression, and falls Referrals and appointments  In addition, I have reviewed and discussed with patient certain preventive protocols, quality metrics, and best practice recommendations. A written personalized care plan for preventive services as well as general preventive health recommendations were provided to patient.     Daphane Shepherd, LPN   [id number]   Nurse Notes: due Tdap vaccine

## 2022-10-14 ENCOUNTER — Encounter: Payer: Self-pay | Admitting: *Deleted

## 2022-11-19 ENCOUNTER — Ambulatory Visit: Payer: Medicare Other | Admitting: Cardiology

## 2022-12-01 ENCOUNTER — Ambulatory Visit (HOSPITAL_BASED_OUTPATIENT_CLINIC_OR_DEPARTMENT_OTHER)
Admission: RE | Admit: 2022-12-01 | Discharge: 2022-12-01 | Disposition: A | Discharge status: Home / Self Care | Payer: Medicare Other | Source: Ambulatory Visit | Attending: Oncology | Admitting: Oncology

## 2022-12-01 DIAGNOSIS — K8689 Other specified diseases of pancreas: Secondary | ICD-10-CM | POA: Diagnosis not present

## 2022-12-01 DIAGNOSIS — N281 Cyst of kidney, acquired: Secondary | ICD-10-CM | POA: Diagnosis not present

## 2022-12-01 DIAGNOSIS — K5730 Diverticulosis of large intestine without perforation or abscess without bleeding: Secondary | ICD-10-CM | POA: Diagnosis not present

## 2022-12-03 ENCOUNTER — Telehealth: Payer: Self-pay

## 2022-12-03 NOTE — Telephone Encounter (Signed)
Patient gave verbal understanding and had no further questions or concerns  

## 2022-12-03 NOTE — Telephone Encounter (Signed)
-----   Message from Ladene Artist, MD sent at 12/03/2022  8:10 AM EDT ----- Please call patient, the pancreas lesion is not seen on CT, no evidence of progressive cancer, f/u as scheduled

## 2022-12-14 DIAGNOSIS — I4892 Unspecified atrial flutter: Secondary | ICD-10-CM | POA: Insufficient documentation

## 2022-12-14 NOTE — Progress Notes (Unsigned)
  Cardiology Office Note:   Date:  12/17/2022  ID:  Brooke Chavez, DOB 1940-09-14, MRN [id number]  History of Present Illness:   Brooke Chavez is a 82 y.o. female who presents for follow up of atrial flutter.   She is s/p ablation and redo ablation.   Post procedure she had some paroxysms of probable fibrillation though these were a very brief period.  She has had lots of atrial ectopy and runs of atrial tachycardia. However, she had no sustained tachyarrhythmias. She had some bradycardic episodes but no sustained bradycardia arrhythmias. There were some junctional beats.  We have managed her medically for this.   She had severe MR.  On 01/01/18 she underwent minimally invasive MV repair, MAZE, and closure of PFO. She had been on Eliquis pre op, Coumadin was started post op. She was discharged 01/14/18. On 01/24/18 she presented to an outside hospital with a left brain stroke. Her INR on presentation was 1.7.    Since I last saw her she has had no new cardiovascular complaints.  She gets around slowly following her stroke.  She uses a walker.  She has no acute complaints.  She is not having any new shortness of breath, PND or orthopnea.  She has not been having any palpitations, presyncope or syncope.  She lives with a daughter and a grandson.    ROS: As stated in the HPI and negative for all other systems.  Studies Reviewed:    EKG: Atrial flutter with ventricular rate 57    Risk Assessment/Calculations:    CHA2DS2-VASc Score = 6   This indicates a 9.7% annual risk of stroke. The patient's score is based upon: CHF History: 0 HTN History: 1 Diabetes History: 0 Stroke History: 2 Vascular Disease History: 0 Age Score: 2 Gender Score: 1     Physical Exam:   VS:  BP 128/78   Pulse (!) 57   Ht 5\' 3"  (1.6 m)   Wt 177 lb (80.3 kg)   LMP 11/05/1992   BMI 31.35 kg/m    Wt Readings from Last 3 Encounters:  12/17/22 177 lb (80.3 kg)  10/01/22 170 lb (77.1 kg)  09/19/22 170 lb (77.1 kg)      GEN: Well nourished, well developed in no acute distress NECK: No JVD; No carotid bruits CARDIAC: RRR, no murmurs, rubs, gallops RESPIRATORY:  Clear to auscultation without rales, wheezing or rhonchi  ABDOMEN: Soft, non-tender, non-distended EXTREMITIES:  Mild leg edema; No deformity   ASSESSMENT AND PLAN:   ATRIAL FLUTTER: She tolerates anticoagulation.  No change in therapy.  She is on the appropriate dose of anticoagulant although her creatinine is borderline.  MITRAL REGURGITATION: This was stable when I looked back in 2022.  No further imaging.  HYPERTENSION : Her blood pressure is well-controlled.  No change in therapy.   BRADYCARDIA : She has no symptoms.  No change in therapy.  EDEMA: She has increased leg edema.  She will not keep her feet elevated and does not want to wear compression stockings.  I am just going to give her Lasix and extra 20 mg for the next couple of days.  She does eat sandwiches as her main food her family understands the salt content but she will not eat much else.        Signed, Rollene Rotunda, MD

## 2022-12-17 ENCOUNTER — Ambulatory Visit (INDEPENDENT_AMBULATORY_CARE_PROVIDER_SITE_OTHER): Payer: Medicare Other | Admitting: Cardiology

## 2022-12-17 ENCOUNTER — Encounter: Payer: Self-pay | Admitting: Cardiology

## 2022-12-17 VITALS — BP 128/78 | HR 57 | Ht 63.0 in | Wt 177.0 lb

## 2022-12-17 DIAGNOSIS — I10 Essential (primary) hypertension: Secondary | ICD-10-CM | POA: Diagnosis not present

## 2022-12-17 DIAGNOSIS — I340 Nonrheumatic mitral (valve) insufficiency: Secondary | ICD-10-CM

## 2022-12-17 DIAGNOSIS — I4892 Unspecified atrial flutter: Secondary | ICD-10-CM | POA: Diagnosis not present

## 2022-12-17 DIAGNOSIS — R001 Bradycardia, unspecified: Secondary | ICD-10-CM

## 2022-12-17 NOTE — Patient Instructions (Signed)
Medication Instructions:  Please take an extra Furosemide 20 mg dose for 2 days then return to normal. Continue all other medications as listed.  *If you need a refill on your cardiac medications before your next appointment, please call your pharmacy*  Follow-Up: At [facility], you and your health needs are our priority.  As part of our continuing mission to provide you with exceptional heart care, we have created designated Provider Care Teams.  These Care Teams include your primary Cardiologist (physician) and Advanced Practice Providers (APPs -  Physician Assistants and Nurse Practitioners) who all work together to provide you with the care you need, when you need it.  We recommend signing up for the patient portal called "MyChart".  Sign up information is provided on this After Visit Summary.  MyChart is used to connect with patients for Virtual Visits (Telemedicine).  Patients are able to view lab/test results, encounter notes, upcoming appointments, etc.  Non-urgent messages can be sent to your provider as well.   To learn more about what you can do with MyChart, go to [email or web address].    Your next appointment:   1 year(s)  Provider:   Rollene Rotunda, MD

## 2023-01-23 ENCOUNTER — Telehealth: Payer: Self-pay | Admitting: Nurse Practitioner

## 2023-01-23 NOTE — Telephone Encounter (Signed)
Unfortunately sheis not going to eat if she does nit want to. Just continue to encourage her to eat. Let me know how sheis doing next week. You can try giving her some ensure daily for protein and calories

## 2023-01-23 NOTE — Telephone Encounter (Signed)
  Incoming Patient Call  01/23/2023  What symptoms do you have? Pt is eating but she is not eating like daughter thinks she should, she is very picky. She was eating a sandwich but now she is not eating them she eats some vegetables, she is not eating greens  How long have you been sick?for awhile, about a week or two she started not eatingsandwiches  Have you been seen for this problem? Yes MMM knows but wanted to update her and know if she needs to be seen?  If your provider decides to give you a prescription, which pharmacy would you like for it to be sent to? [facility]   Patient informed that this information will be sent to the clinical staff for review and that they should receive a follow up call.

## 2023-01-26 NOTE — Telephone Encounter (Signed)
Called and spoke with [place]. Advised about the Ensure and she said she would try that. She is very stubborn and will only eat what she wants. She states that she will try the ensure and let us know.

## 2023-03-16 ENCOUNTER — Ambulatory Visit: Payer: PRIVATE HEALTH INSURANCE | Admitting: Nurse Practitioner

## 2023-03-16 ENCOUNTER — Other Ambulatory Visit: Payer: Medicare Other

## 2023-03-21 ENCOUNTER — Other Ambulatory Visit: Payer: Self-pay | Admitting: Nurse Practitioner

## 2023-03-21 DIAGNOSIS — I10 Essential (primary) hypertension: Secondary | ICD-10-CM

## 2023-04-10 ENCOUNTER — Other Ambulatory Visit: Payer: Self-pay | Admitting: Nurse Practitioner

## 2023-04-10 DIAGNOSIS — I480 Paroxysmal atrial fibrillation: Secondary | ICD-10-CM

## 2023-04-14 ENCOUNTER — Encounter: Payer: Self-pay | Admitting: Nurse Practitioner

## 2023-04-14 ENCOUNTER — Other Ambulatory Visit: Payer: Self-pay | Admitting: Nurse Practitioner

## 2023-04-14 ENCOUNTER — Ambulatory Visit (INDEPENDENT_AMBULATORY_CARE_PROVIDER_SITE_OTHER): Payer: Medicare Other | Admitting: Nurse Practitioner

## 2023-04-14 ENCOUNTER — Ambulatory Visit: Payer: Medicare Other

## 2023-04-14 VITALS — BP 142/78 | HR 66 | Temp 97.3°F | Resp 20 | Ht 63.0 in | Wt 168.0 lb

## 2023-04-14 DIAGNOSIS — F5101 Primary insomnia: Secondary | ICD-10-CM

## 2023-04-14 DIAGNOSIS — E785 Hyperlipidemia, unspecified: Secondary | ICD-10-CM

## 2023-04-14 DIAGNOSIS — I69391 Dysphagia following cerebral infarction: Secondary | ICD-10-CM

## 2023-04-14 DIAGNOSIS — I5032 Chronic diastolic (congestive) heart failure: Secondary | ICD-10-CM

## 2023-04-14 DIAGNOSIS — R600 Localized edema: Secondary | ICD-10-CM

## 2023-04-14 DIAGNOSIS — I69398 Other sequelae of cerebral infarction: Secondary | ICD-10-CM | POA: Diagnosis not present

## 2023-04-14 DIAGNOSIS — I10 Essential (primary) hypertension: Secondary | ICD-10-CM | POA: Diagnosis not present

## 2023-04-14 DIAGNOSIS — I69351 Hemiplegia and hemiparesis following cerebral infarction affecting right dominant side: Secondary | ICD-10-CM

## 2023-04-14 DIAGNOSIS — M810 Age-related osteoporosis without current pathological fracture: Secondary | ICD-10-CM | POA: Diagnosis not present

## 2023-04-14 DIAGNOSIS — Z780 Asymptomatic menopausal state: Secondary | ICD-10-CM

## 2023-04-14 DIAGNOSIS — N1831 Chronic kidney disease, stage 3a: Secondary | ICD-10-CM

## 2023-04-14 DIAGNOSIS — I480 Paroxysmal atrial fibrillation: Secondary | ICD-10-CM | POA: Diagnosis not present

## 2023-04-14 DIAGNOSIS — K219 Gastro-esophageal reflux disease without esophagitis: Secondary | ICD-10-CM

## 2023-04-14 MED ORDER — LOSARTAN POTASSIUM 100 MG PO TABS
100.0000 mg | ORAL_TABLET | Freq: Every day | ORAL | 1 refills | Status: DC
Start: 2023-04-14 — End: 2023-10-15

## 2023-04-14 MED ORDER — TRAZODONE HCL 50 MG PO TABS
50.0000 mg | ORAL_TABLET | Freq: Every day | ORAL | 1 refills | Status: DC
Start: 2023-04-14 — End: 2023-10-19

## 2023-04-14 MED ORDER — ROSUVASTATIN CALCIUM 10 MG PO TABS
10.0000 mg | ORAL_TABLET | Freq: Every day | ORAL | 1 refills | Status: DC
Start: 2023-04-14 — End: 2023-10-19

## 2023-04-14 MED ORDER — AMLODIPINE BESYLATE 5 MG PO TABS
5.0000 mg | ORAL_TABLET | Freq: Every day | ORAL | 1 refills | Status: DC
Start: 2023-04-14 — End: 2023-10-19

## 2023-04-14 MED ORDER — APIXABAN 5 MG PO TABS
5.0000 mg | ORAL_TABLET | Freq: Two times a day (BID) | ORAL | 5 refills | Status: DC
Start: 2023-04-14 — End: 2023-10-19

## 2023-04-14 MED ORDER — FUROSEMIDE 40 MG PO TABS
40.0000 mg | ORAL_TABLET | Freq: Every day | ORAL | 1 refills | Status: DC
Start: 2023-04-14 — End: 2023-10-19

## 2023-04-14 NOTE — Progress Notes (Signed)
Subjective:    Patient ID: Brooke Chavez, female    DOB: 12-29-1940, 82 y.o.   MRN: [id number]   Chief Complaint: medical management of chronic issues     HPI:  Brooke Chavez is a 82 y.o. who identifies as a female who was assigned female at birth.   Social history: Lives with: daughters Work history: disabled   Comes in today for follow up of the following chronic medical issues:  1. Essential hypertension No c/o chest pain, sob or headache. Does not check blood pressure at home. BP Readings from Last 3 Encounters:  12/17/22 128/78  09/19/22 (!) 175/76  08/14/22 (!) 161/79     2. Chronic diastolic congestive heart failure (HCC) 3. Junctional bradycardia 4. PAF (paroxysmal atrial fibrillation) (HCC) 5. Aortic occlusive disease Denies heart racing or palpitations. No syncopal  or near syncopal episodes. Last saw cardiology on 12/17/22. She had edema the day she saw cardiology so he increased her lasix by 20mg  for a few days.   6. Dysphagia, post-stroke No issues  7. Gastroesophageal reflux disease without esophagitis On no meds. Has no recent complaints  8. 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 Gait disturbance, post-stroke 10. PBA (pseudobulbar affect) Is on eliquis with no issues. She still cries occasionally but is doing well. She as had no recent falls despite hr gait issues. She uses walker to get around  11. Hyperlipidemia with target LDL less than 100 Has very poor appetite and will only eat a few things. Her diet does not change much from day to day. She does no exercise at all.  12. Stage 3a chronic kidney disease (HCC) No voiding issues Lab Results  Component Value Date   CREATININE 1.44 (H) 09/19/2022     13. Age-related osteoporosis without current pathological fracture No weight bearing exercise. Last bone density test was in 2016. Dont feel that necessary  14. BMI 30.0-30.9,adult Weight is down  9lbs . Wt Readings from Last 3 Encounters:  04/14/23 168 lb (76.2 kg)  12/17/22 177 lb (80.3 kg)  10/01/22 170 lb (77.1 kg)   BMI Readings from Last 3 Encounters:  04/14/23 29.76 kg/m  12/17/22 31.35 kg/m  10/01/22 30.11 kg/m      New complaints: None today  Allergies  Allergen Reactions   Oxycodone-Acetaminophen Anxiety and Other (See Comments)    Hallucinations   Aspirin Nausea And Vomiting and Nausea Only    Other reaction(s): GI Upset (intolerance)   Outpatient Encounter Medications as of 04/14/2023  Medication Sig   acetaminophen (TYLENOL) 325 MG tablet Take 2 tablets (650 mg total) by mouth every 6 (six) hours as needed for mild pain (or Fever >/= 101).   amLODipine (NORVASC) 5 MG tablet Take 1 tablet by mouth once daily   ELIQUIS 5 MG TABS tablet Take 1 tablet by mouth twice daily   furosemide (LASIX) 40 MG tablet Take 1 tablet (40 mg total) by mouth daily.   losartan (COZAAR) 100 MG tablet Take 1 tablet (100 mg total) by mouth daily. (NEEDS TO BE SEEN BEFORE NEXT REFILL)   rosuvastatin (CRESTOR) 10 MG tablet Take 1 tablet (10 mg total) by mouth daily.   traZODone (DESYREL) 50 MG tablet Take 1 tablet (50 mg total) by mouth at bedtime.   [DISCONTINUED] apixaban (ELIQUIS) 5 MG TABS tablet Take 1 tablet (5 mg total) by mouth 2 (two) times daily.   No facility-administered encounter medications on file as of 04/14/2023.  Past Surgical History:  Procedure Laterality Date   A FLUTTER ABLATION N/A 06/27/2011   Procedure: ABLATION A FLUTTER;  Surgeon: Hillis Range, MD;  Location: [facility] CATH LAB;  Service: Cardiovascular;  Laterality: N/A;   ATRIAL ABLATION SURGERY  06/2010   HIP SURGERY     Left (fracture) 3/13   JOINT REPLACEMENT     both knees    KNEE ARTHROSCOPY     Right   LUMBAR SPINE SURGERY     MITRAL VALVE REPAIR Right 01/07/2018   Procedure: MINIMALLY INVASIVE MITRAL VALVE REPAIR using LivaNova ring size 28 MM;  Surgeon: Purcell Nails, MD;  Location: [facility] OR;   Service: Open Heart Surgery;  Laterality: Right;   PATENT FORAMEN OVALE(PFO) CLOSURE N/A 01/07/2018   Procedure: PATENT FORAMEN OVALE (PFO) CLOSURE;  Surgeon: Purcell Nails, MD;  Location: [facility] OR;  Service: Open Heart Surgery;  Laterality: N/A;   RIGHT/LEFT HEART CATH AND CORONARY ANGIOGRAPHY N/A 11/11/2017   Procedure: RIGHT/LEFT HEART CATH AND CORONARY ANGIOGRAPHY;  Surgeon: Tonny Bollman, MD;  Location: [facility] INVASIVE CV LAB;  Service: Cardiovascular;  Laterality: N/A;   TEE WITHOUT CARDIOVERSION N/A 09/18/2017   Procedure: TRANSESOPHAGEAL ECHOCARDIOGRAM (TEE);  Surgeon: Chilton Si, MD;  Location: [facility] ENDOSCOPY;  Service: Cardiovascular;  Laterality: N/A;   TEE WITHOUT CARDIOVERSION N/A 01/07/2018   Procedure: TRANSESOPHAGEAL ECHOCARDIOGRAM (TEE);  Surgeon: Purcell Nails, MD;  Location: [facility] OR;  Service: Open Heart Surgery;  Laterality: N/A;   TOTAL KNEE ARTHROPLASTY     Left   TUBAL LIGATION      Family History  Problem Relation Age of Onset   Hypertension Mother    Cancer Sister        leukemia   Diabetes Brother    Stroke Sister    Diabetes Brother    Heart attack Brother    Healthy Daughter    Healthy Son    Healthy Daughter       Controlled substance contract: n/a     Review of Systems  Constitutional:  Negative for diaphoresis.  Eyes:  Negative for pain.  Respiratory:  Negative for shortness of breath.   Cardiovascular:  Negative for chest pain, palpitations and leg swelling.  Gastrointestinal:  Negative for abdominal pain.  Endocrine: Negative for polydipsia.  Skin:  Negative for rash.  Neurological:  Negative for dizziness, weakness and headaches.  Hematological:  Does not bruise/bleed easily.  All other systems reviewed and are negative.      Objective:   Physical Exam Vitals and nursing note reviewed.  Constitutional:      General: She is not in acute distress.    Appearance: Normal appearance. She is well-developed.  HENT:     Head:  Normocephalic.     Right Ear: Tympanic membrane normal.     Left Ear: Tympanic membrane normal.     Nose: Nose normal.     Mouth/Throat:     Mouth: Mucous membranes are moist.  Eyes:     Pupils: Pupils are equal, round, and reactive to light.  Neck:     Vascular: No carotid bruit or JVD.  Cardiovascular:     Rate and Rhythm: Normal rate. Rhythm irregular.     Heart sounds: Normal heart sounds.  Pulmonary:     Effort: Pulmonary effort is normal. No respiratory distress.     Breath sounds: Normal breath sounds. No wheezing or rales.  Chest:     Chest wall: No tenderness.  Abdominal:     General: Bowel  sounds are normal. There is no distension or abdominal bruit.     Palpations: Abdomen is soft. There is no hepatomegaly, splenomegaly, mass or pulsatile mass.     Tenderness: There is no abdominal tenderness.  Musculoskeletal:        General: Normal range of motion.     Cervical back: Normal range of motion and neck supple.     Right lower leg: Edema (1+) present.     Left lower leg: Edema (1+) present.     Comments: Gait slow and steady with walker  Lymphadenopathy:     Cervical: No cervical adenopathy.  Skin:    General: Skin is warm and dry.  Neurological:     Mental Status: She is alert and oriented to person, place, and time.     Deep Tendon Reflexes: Reflexes are normal and symmetric.  Psychiatric:        Behavior: Behavior normal.        Thought Content: Thought content normal.        Judgment: Judgment normal.     BP (!) 142/78   Pulse 66   Temp (!) 97.3 F (36.3 C) (Temporal)   Resp 20   Ht 5\' 3"  (1.6 m)   Wt 168 lb (76.2 kg)   LMP 11/05/1992   SpO2 96%   BMI 29.76 kg/m         Assessment & Plan:   Brooke Chavez comes in today with chief complaint of Medical Management of Chronic Issues   Diagnosis and orders addressed:  1. Essential hypertension Low sodium diet - amLODipine (NORVASC) 5 MG tablet; Take 1 tablet (5 mg total) by mouth daily.   Dispense: 90 tablet; Refill: 1 - losartan (COZAAR) 100 MG tablet; Take 1 tablet (100 mg total) by mouth daily. (NEEDS TO BE SEEN BEFORE NEXT REFILL)  Dispense: 90 tablet; Refill: 1  2. Chronic diastolic congestive heart failure (HCC) 3. Junctional bradycardia 4. PAF (paroxysmal atrial fibrillation) (HCC) Keep followup with cardiology - apixaban (ELIQUIS) 5 MG TABS tablet; Take 1 tablet (5 mg total) by mouth 2 (two) times daily.  Dispense: 60 tablet; Refill: 5  5. Dysphagia, post-stroke Chew food well  6. Gastroesophageal reflux disease without esophagitis Avoid spicy foods Do not eat 2 hours prior to bedtime   7. 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 Gait disturbance, post-stroke Fall precautions  9. PBA (pseudobulbar affect)   10. Hyperlipidemia with target LDL less than 100 Low fat diet - rosuvastatin (CRESTOR) 10 MG tablet; Take 1 tablet (10 mg total) by mouth daily.  Dispense: 90 tablet; Refill: 1  11. Stage 3a chronic kidney disease (HCC) Labs pending  12. Age-related osteoporosis without current pathological fracture  13. BMI 30.0-30.9,adult Discussed diet and exercise for person with BMI >25 Will recheck weight in 3-6 months   14. Aortoiliac occlusive disease (HCC)  15. Peripheral edema Elevate legs when sitting - furosemide (LASIX) 40 MG tablet; Take 1 tablet (40 mg total) by mouth daily.  Dispense: 90 tablet; Refill: 1  16. Primary insomnia Bedtime routine - traZODone (DESYREL) 50 MG tablet; Take 1 tablet (50 mg total) by mouth at bedtime.  Dispense: 90 tablet; Refill: 1   Labs pending Health Maintenance reviewed Diet and exercise encouraged  Follow up plan: 6 months   Mary-Margaret Daphine Deutscher, FNP

## 2023-04-14 NOTE — Patient Instructions (Signed)
Fall Prevention in the Home, Adult Falls can cause injuries and can happen to people of all ages. There are many things you can do to make your home safer and to help prevent falls. What actions can I take to prevent falls? General information Use good lighting in all rooms. Make sure to: Replace any light bulbs that burn out. Turn on the lights in dark areas and use night-lights. Keep items that you use often in easy-to-reach places. Lower the shelves around your home if needed. Move furniture so that there are clear paths around it. Do not use throw rugs or other things on the floor that can make you trip. If any of your floors are uneven, fix them. Add color or contrast paint or tape to clearly mark and help you see: Grab bars or handrails. First and last steps of staircases. Where the edge of each step is. If you use a ladder or stepladder: Make sure that it is fully opened. Do not climb a closed ladder. Make sure the sides of the ladder are locked in place. Have someone hold the ladder while you use it. Know where your pets are as you move through your home. What can I do in the bathroom?     Keep the floor dry. Clean up any water on the floor right away. Remove soap buildup in the bathtub or shower. Buildup makes bathtubs and showers slippery. Use non-skid mats or decals on the floor of the bathtub or shower. Attach bath mats securely with double-sided, non-slip rug tape. If you need to sit down in the shower, use a non-slip stool. Install grab bars by the toilet and in the bathtub and shower. Do not use towel bars as grab bars. What can I do in the bedroom? Make sure that you have a light by your bed that is easy to reach. Do not use any sheets or blankets on your bed that hang to the floor. Have a firm chair or bench with side arms that you can use for support when you get dressed. What can I do in the kitchen? Clean up any spills right away. If you need to reach something  above you, use a step stool with a grab bar. Keep electrical cords out of the way. Do not use floor polish or wax that makes floors slippery. What can I do with my stairs? Do not leave anything on the stairs. Make sure that you have a light switch at the top and the bottom of the stairs. Make sure that there are handrails on both sides of the stairs. Fix handrails that are broken or loose. Install non-slip stair treads on all your stairs if they do not have carpet. Avoid having throw rugs at the top or bottom of the stairs. Choose a carpet that does not hide the edge of the steps on the stairs. Make sure that the carpet is firmly attached to the stairs. Fix carpet that is loose or worn. What can I do on the outside of my home? Use bright outdoor lighting. Fix the edges of walkways and driveways and fix any cracks. Clear paths of anything that can make you trip, such as tools or rocks. Add color or contrast paint or tape to clearly mark and help you see anything that might make you trip as you walk through a door, such as a raised step or threshold. Trim any bushes or trees on paths to your home. Check to see if handrails are loose   or broken and that both sides of all steps have handrails. Install guardrails along the edges of any raised decks and porches. Have leaves, snow, or ice cleared regularly. Use sand, salt, or ice melter on paths if you live where there is ice and snow during the winter. Clean up any spills in your garage right away. This includes grease or oil spills. What other actions can I take? Review your medicines with your doctor. Some medicines can cause dizziness or changes in blood pressure, which increase your risk of falling. Wear shoes that: Have a low heel. Do not wear high heels. Have rubber bottoms and are closed at the toe. Feel good on your feet and fit well. Use tools that help you move around if needed. These include: Canes. Walkers. Scooters. Crutches. Ask  your doctor what else you can do to help prevent falls. This may include seeing a physical therapist to learn to do exercises to move better and get stronger. Where to find more information Centers for Disease Control and Prevention, [employer]: cdc.gov National Institute on Aging: nia.nih.gov National Institute on Aging: nia.nih.gov Contact a doctor if: You are afraid of falling at home. You feel weak, drowsy, or dizzy at home. You fall at home. Get help right away if you: Lose consciousness or have trouble moving after a fall. Have a fall that causes a head injury. These symptoms may be an emergency. Get help right away. Call 911. Do not wait to see if the symptoms will go away. Do not drive yourself to the hospital. This information is not intended to replace advice given to you by your health care provider. Make sure you discuss any questions you have with your health care provider. Document Revised: 03/31/2022 Document Reviewed: 03/31/2022 Elsevier Patient Education  2024 Elsevier Inc.  

## 2023-04-15 LAB — CBC WITH DIFFERENTIAL/PLATELET
Basophils Absolute: 0 10*3/uL (ref 0.0–0.2)
Basos: 0 %
EOS (ABSOLUTE): 0 10*3/uL (ref 0.0–0.4)
Eos: 0 %
Hematocrit: 35.9 % (ref 34.0–46.6)
Hemoglobin: 12.1 g/dL (ref 11.1–15.9)
Immature Grans (Abs): 0 10*3/uL (ref 0.0–0.1)
Immature Granulocytes: 1 %
Lymphocytes Absolute: 1.7 10*3/uL (ref 0.7–3.1)
Lymphs: 44 %
MCH: 31.4 pg (ref 26.6–33.0)
MCHC: 33.7 g/dL (ref 31.5–35.7)
MCV: 93 fL (ref 79–97)
Monocytes Absolute: 0.2 10*3/uL (ref 0.1–0.9)
Monocytes: 5 %
Neutrophils Absolute: 1.9 10*3/uL (ref 1.4–7.0)
Neutrophils: 50 %
RBC: 3.85 x10E6/uL (ref 3.77–5.28)
RDW: 13.5 % (ref 11.7–15.4)
WBC: 3.8 10*3/uL (ref 3.4–10.8)

## 2023-04-15 LAB — CMP14+EGFR
ALT: 9 IU/L (ref 0–32)
AST: 24 IU/L (ref 0–40)
Albumin: 4.1 g/dL (ref 3.7–4.7)
Alkaline Phosphatase: 83 IU/L (ref 44–121)
BUN/Creatinine Ratio: 9 — ABNORMAL LOW (ref 12–28)
BUN: 17 mg/dL (ref 8–27)
Bilirubin Total: 0.7 mg/dL (ref 0.0–1.2)
CO2: 17 mmol/L — ABNORMAL LOW (ref 20–29)
Calcium: 9.5 mg/dL (ref 8.7–10.3)
Chloride: 102 mmol/L (ref 96–106)
Creatinine, Ser: 1.84 mg/dL — ABNORMAL HIGH (ref 0.57–1.00)
Globulin, Total: 3.5 g/dL (ref 1.5–4.5)
Glucose: 107 mg/dL — ABNORMAL HIGH (ref 70–99)
Potassium: 4.4 mmol/L (ref 3.5–5.2)
Sodium: 144 mmol/L (ref 134–144)
Total Protein: 7.6 g/dL (ref 6.0–8.5)
eGFR: 27 mL/min/{1.73_m2} — ABNORMAL LOW (ref >59)

## 2023-04-15 LAB — LIPID PANEL
Chol/HDL Ratio: 3.2 ratio (ref 0.0–4.4)
Cholesterol, Total: 173 mg/dL (ref 100–199)
HDL: 54 mg/dL (ref >39)
LDL Chol Calc (NIH): 100 mg/dL — ABNORMAL HIGH (ref 0–99)
Triglycerides: 108 mg/dL (ref 0–149)
VLDL Cholesterol Cal: 19 mg/dL (ref 5–40)

## 2023-08-13 ENCOUNTER — Ambulatory Visit: Payer: Medicare Other | Admitting: Oncology

## 2023-08-14 ENCOUNTER — Ambulatory Visit: Payer: Medicare Other | Admitting: Oncology

## 2023-08-27 ENCOUNTER — Inpatient Hospital Stay: Payer: Medicare Other | Attending: Oncology | Admitting: Oncology

## 2023-08-27 VITALS — BP 131/69 | HR 66 | Temp 98.0°F | Resp 20 | Ht 63.0 in | Wt 161.1 lb

## 2023-08-27 DIAGNOSIS — K8689 Other specified diseases of pancreas: Secondary | ICD-10-CM

## 2023-08-27 DIAGNOSIS — I4892 Unspecified atrial flutter: Secondary | ICD-10-CM | POA: Insufficient documentation

## 2023-08-27 DIAGNOSIS — K869 Disease of pancreas, unspecified: Secondary | ICD-10-CM | POA: Diagnosis not present

## 2023-08-27 DIAGNOSIS — K7689 Other specified diseases of liver: Secondary | ICD-10-CM | POA: Insufficient documentation

## 2023-08-27 DIAGNOSIS — N289 Disorder of kidney and ureter, unspecified: Secondary | ICD-10-CM | POA: Diagnosis not present

## 2023-08-27 DIAGNOSIS — Z8673 Personal history of transient ischemic attack (TIA), and cerebral infarction without residual deficits: Secondary | ICD-10-CM | POA: Diagnosis not present

## 2023-08-27 DIAGNOSIS — Z8774 Personal history of (corrected) congenital malformations of heart and circulatory system: Secondary | ICD-10-CM | POA: Diagnosis not present

## 2023-08-27 DIAGNOSIS — I10 Essential (primary) hypertension: Secondary | ICD-10-CM | POA: Insufficient documentation

## 2023-08-27 NOTE — Progress Notes (Signed)
  [facility] OFFICE PROGRESS NOTE   Diagnosis: Pancreas neuroendocrine tumor  INTERVAL HISTORY:   Ms. Dekoning returns as scheduled.  She is here with her daughter.  No specific complaint.  Good appetite.  No fever, sweats, rash, or diarrhea.  Objective:  Vital signs in last 24 hours:  Blood pressure 131/69, pulse 66, temperature 98 F (36.7 C), temperature source Oral, resp. rate 20, height 5\' 3"  (1.6 m), weight 161 lb 1.6 oz (73.1 kg), last menstrual period 11/05/1992.    Lymphatics: No cervical, supraclavicular, axillary, or inguinal nodes Resp: Shallow inspirations, no respiratory distress Cardio: Regular rhythm with premature beats GI: No mass, no hepatosplenomegaly Vascular: Trace lower leg/ankle edema bilaterally Neuro: Alert, possible expressive aphasia   Lab Results:  Lab Results  Component Value Date   WBC 3.8 04/14/2023   HGB 12.1 04/14/2023   HCT 35.9 04/14/2023   MCV 93 04/14/2023   PLT Comment 04/14/2023   NEUTROABS 1.9 04/14/2023    CMP  Lab Results  Component Value Date   NA 144 04/14/2023   K 4.4 04/14/2023   CL 102 04/14/2023   CO2 17 (L) 04/14/2023   GLUCOSE 107 (H) 04/14/2023   BUN 17 04/14/2023   CREATININE 1.84 (H) 04/14/2023   CALCIUM 9.5 04/14/2023   PROT 7.6 04/14/2023   ALBUMIN 4.1 04/14/2023   AST 24 04/14/2023   ALT 9 04/14/2023   ALKPHOS 83 04/14/2023   BILITOT 0.7 04/14/2023   GFRNONAA 24 (L) 05/14/2020   GFRAA 28 (L) 05/14/2020    Medications: I have reviewed the patient's current medications.   Assessment/Plan:  Pancreas mass 0.8 cm hypervascular lesion in the pancreas body and multiple enhancing liver lesions noted on CT 11/13/2017 MRI 11/21/2017-hypervascular liver lesions evident on arterial phase, pancreas lesion not identified Netspot 11/30/2017- intense uptake at the pancreas tail lesion consistent with a primary neuroendocrine neoplasm, no uptake in the liver above background CT 06/29/2019-unchanged  pancreas tail lesion multiple low-attenuation liver lesions-largest lesions were characterized as cysts and hemangiomata by MRI, small hyperenhancing lesion seen by MRI are not appreciated by CT MRI abdomen 06/28/2020-stable hepatic cysts, no visible pancreas lesion-study limited by respiratory motion, no ductal dilatation, cardiac enlargement CT abdomen/pelvis 11/27/2020-previously seen tiny hyper vascular lesion in pancreatic tail not visualized on current unenhanced exam.  Stable small hepatic cysts.  No new or enlarging liver lesions seen. 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  She has a history of liver cysts.  There is no clinical evidence for progression of the pancreas neuroendocrine tumor.  She would like to continue follow-up at the Cancer center.  She will return for an office visit in 1 year.  I discussed the indication for continuing surveillance imaging with her daughter.  We decided against further CT imaging.  Thornton Papas, MD  08/27/2023  9:48 AM

## 2023-10-06 ENCOUNTER — Ambulatory Visit (INDEPENDENT_AMBULATORY_CARE_PROVIDER_SITE_OTHER): Payer: Medicare Other

## 2023-10-06 VITALS — Ht 63.0 in | Wt 161.0 lb

## 2023-10-06 DIAGNOSIS — Z0000 Encounter for general adult medical examination without abnormal findings: Secondary | ICD-10-CM | POA: Diagnosis not present

## 2023-10-06 NOTE — Patient Instructions (Signed)
 [name] , Thank you for taking time to come for your Medicare Wellness Visit. I appreciate your ongoing commitment to your health goals. Please review the following plan we discussed and let me know if I can assist you in the future.   Referrals/Orders/Follow-Ups/Clinician Recommendations: Aim for 30 minutes of exercise or brisk walking, 6-8 glasses of water, and 5 servings of fruits and vegetables each day.  This is a list of the screening recommended for you and due dates:  Health Maintenance  Topic Date Due   Zoster (Shingles) Vaccine (1 of 2) Never done   Pneumonia Vaccine (2 of 2 - PCV) 04/28/2012   Pap Smear  09/19/2016   DEXA scan (bone density measurement)  07/12/2017   DTaP/Tdap/Td vaccine (2 - Td or Tdap) 04/28/2021   COVID-19 Vaccine (4 - 2024-25 season) 04/12/2023   Flu Shot  11/09/2023*   Medicare Annual Wellness Visit  10/05/2024   HPV Vaccine  Aged Out  *Topic was postponed. The date shown is not the original due date.    Advanced directives: (In Chart) A copy of your advanced directives are scanned into your chart should your provider ever need it.  Next Medicare Annual Wellness Visit scheduled for next year: Yes

## 2023-10-06 NOTE — Progress Notes (Signed)
 Subjective:   Brooke Chavez is a 83 y.o. who presents for a Medicare Wellness preventive visit.  Visit Complete: Virtual I connected with  Brooke Chavez on 10/06/23 by a audio enabled telemedicine application and verified that I am speaking with the correct person using two identifiers.  Patient Location: Home  Provider Location: Home Office  I discussed the limitations of evaluation and management by telemedicine. The patient expressed understanding and agreed to proceed.  Vital Signs: Because this visit was a virtual/telehealth visit, some criteria may be missing or patient reported. Any vitals not documented were not able to be obtained and vitals that have been documented are patient reported.  VideoDeclined- This patient declined Librarian, academic. Therefore the visit was completed with audio only.  AWV Questionnaire: No: Patient Medicare AWV questionnaire was not completed prior to this visit.  Cardiac Risk Factors include: advanced age (>79men, >80 women);dyslipidemia;hypertension     Objective:    Today's Vitals   10/06/23 0818  Weight: 161 lb (73 kg)  Height: 5\' 3"  (1.6 m)   Body mass index is 28.52 kg/m.     10/06/2023    8:28 AM 08/27/2023    9:20 AM 10/01/2022    2:56 PM 08/14/2022   10:19 AM 08/29/2021    4:57 PM 08/14/2021   10:00 AM 12/12/2020   10:45 PM  Advanced Directives  Does Patient Have a Medical Advance Directive? No Yes Yes Yes Yes Yes Yes  Type of Furniture conservator/restorer;Living will Healthcare Power of [place];Living will Healthcare Power of [place];Living will Healthcare Power of [place];Living will Healthcare Power of [place];Living will;Out of facility DNR (pink MOST or yellow form) Healthcare Power of [place];Living will  Does patient want to make changes to medical advance directive?  No - Patient declined No - Patient declined No - Patient declined  No - Patient declined   Copy of Healthcare  Power of Attorney in Chart?  Yes - validated most recent copy scanned in chart (See row information) Yes - validated most recent copy scanned in chart (See row information) Yes - validated most recent copy scanned in chart (See row information) Yes - validated most recent copy scanned in chart (See row information)    Would patient like information on creating a medical advance directive? Yes (MAU/Ambulatory/Procedural Areas - Information given)     No - Patient declined     Current Medications (verified) Outpatient Encounter Medications as of 10/06/2023  Medication Sig   acetaminophen (TYLENOL) 325 MG tablet Take 2 tablets (650 mg total) by mouth every 6 (six) hours as needed for mild pain (or Fever >/= 101).   amLODipine (NORVASC) 5 MG tablet Take 1 tablet (5 mg total) by mouth daily.   apixaban (ELIQUIS) 5 MG TABS tablet Take 1 tablet (5 mg total) by mouth 2 (two) times daily.   furosemide (LASIX) 40 MG tablet Take 1 tablet (40 mg total) by mouth daily.   losartan (COZAAR) 100 MG tablet Take 1 tablet (100 mg total) by mouth daily. (NEEDS TO BE SEEN BEFORE NEXT REFILL)   rosuvastatin (CRESTOR) 10 MG tablet Take 1 tablet (10 mg total) by mouth daily.   traZODone (DESYREL) 50 MG tablet Take 1 tablet (50 mg total) by mouth at bedtime.   No facility-administered encounter medications on file as of 10/06/2023.    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   LMCA stroke post MV repair  01/24/18 (INR  1.7)   Liver masses 11/13/2017   Multiple small nodules seen on CT and MRI   Mitral regurgitation    severe   Pancreatic mass 11/13/2017   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   Complex valvuloplasty including artificial Gore-tex neochord placement x6 and 28 mm Sorin Memo 4D ring annuloplasty via right mini thoracotomy approach   Stroke (HCC)    TR (tricuspid regurgitation)    Mild with RA enlargment   Past Surgical History:  Procedure Laterality Date   A FLUTTER ABLATION N/A 06/27/2011   Procedure: ABLATION A FLUTTER;  Surgeon: Hillis Range, MD;  Location: [facility] CATH LAB;  Service: Cardiovascular;  Laterality: N/A;   ATRIAL ABLATION SURGERY  06/2010   HIP SURGERY     Left (fracture) 3/13   JOINT REPLACEMENT     both knees    KNEE ARTHROSCOPY     Right   LUMBAR SPINE SURGERY     MITRAL VALVE REPAIR Right 01/07/2018   Procedure: MINIMALLY INVASIVE MITRAL VALVE REPAIR using LivaNova ring size 28 MM;  Surgeon: Purcell Nails, MD;  Location: [facility] OR;  Service: Open Heart Surgery;  Laterality: Right;   PATENT FORAMEN OVALE(PFO) CLOSURE N/A 01/07/2018   Procedure: PATENT FORAMEN OVALE (PFO) CLOSURE;  Surgeon: Purcell Nails, MD;  Location: [facility] OR;  Service: Open Heart Surgery;  Laterality: N/A;   RIGHT/LEFT HEART CATH AND CORONARY ANGIOGRAPHY N/A 11/11/2017   Procedure: RIGHT/LEFT HEART CATH AND CORONARY ANGIOGRAPHY;  Surgeon: Tonny Bollman, MD;  Location: [facility] INVASIVE CV LAB;  Service: Cardiovascular;  Laterality: N/A;   TEE WITHOUT CARDIOVERSION N/A 09/18/2017   Procedure: TRANSESOPHAGEAL ECHOCARDIOGRAM (TEE);  Surgeon: Chilton Si, MD;  Location: [facility] ENDOSCOPY;  Service: Cardiovascular;  Laterality: N/A;   TEE WITHOUT CARDIOVERSION N/A 01/07/2018   Procedure: TRANSESOPHAGEAL ECHOCARDIOGRAM (TEE);  Surgeon: Purcell Nails, MD;  Location: [facility] OR;  Service: Open Heart Surgery;  Laterality: N/A;   TOTAL KNEE ARTHROPLASTY     Left   TUBAL LIGATION     Family History  Problem  Relation Age of Onset   Hypertension Mother    Cancer Sister        leukemia   Diabetes Brother    Stroke Sister    Diabetes Brother    Heart attack Brother    Healthy Daughter    Healthy Son    Healthy Daughter    Social History   Socioeconomic History   Marital status: Widowed    Spouse name: Not on file   Number of children: 3   Years of education: 21   Highest education level: Not on file  Occupational History    Comment: na  Tobacco Use   Smoking status: Never   Smokeless tobacco: Never  Vaping Use   Vaping status: Never Used  Substance and Sexual Activity   Alcohol use: No   Drug use: No   Sexual activity: Never  Other Topics Concern  Not on file  Social History Narrative   Her daughter lives with her - Al Pimple   Patient unable to talk   Little caffeine   Social Drivers of Health   Financial Resource Strain: Low Risk  (10/06/2023)   Overall Financial Resource Strain (CARDIA)    Difficulty of Paying Living Expenses: Not hard at all  Food Insecurity: No Food Insecurity (10/06/2023)   Hunger Vital Sign    Worried About Running Out of Food in the Last Year: Never true    Ran Out of Food in the Last Year: Never true  Transportation Needs: No Transportation Needs (10/06/2023)   PRAPARE - Administrator, Civil Service (Medical): No    Lack of Transportation (Non-Medical): No  Physical Activity: Inactive (10/06/2023)   Exercise Vital Sign    Days of Exercise per Week: 0 days    Minutes of Exercise per Session: 0 min  Stress: No Stress Concern Present (10/06/2023)   [employer] of Occupational Health - Occupational Stress Questionnaire    Feeling of Stress : Not at all  Social Connections: Moderately Integrated (10/06/2023)   Social Connection and Isolation Panel [NHANES]    Frequency of Communication with Friends and Family: More than three times a week    Frequency of Social Gatherings with Friends and Family: Three times a week     Attends Religious Services: More than 4 times per year    Active Member of Clubs or Organizations: Yes    Attends Banker Meetings: More than 4 times per year    Marital Status: Widowed    Tobacco Counseling Counseling given: Not Answered    Clinical Intake:  Pre-visit preparation completed: Yes  Pain : No/denies pain     Diabetes: No  How often do you need to have someone help you when you read instructions, pamphlets, or other written materials from your doctor or pharmacy?: 1 - Never  Interpreter Needed?: No  Comments: Assisted with visit by daughter- Larina Bras Information entered by :: Kandis Fantasia LPN   Activities of Daily Living     10/06/2023    8:24 AM  In your present state of health, do you have any difficulty performing the following activities:  Hearing? 0  Vision? 0  Difficulty concentrating or making decisions? 1  Walking or climbing stairs? 1  Dressing or bathing? 0  Doing errands, shopping? 1  Preparing Food and eating ? N  Using the Toilet? N  In the past six months, have you accidently leaked urine? N  Do you have problems with loss of bowel control? N  Managing your Medications? Y  Managing your Finances? Y  Housekeeping or managing your Housekeeping? Y    Patient Care Team: Bennie Pierini, FNP as PCP - General (Nurse Practitioner) Rollene Rotunda, MD as PCP - Cardiology (Cardiology)  Indicate any recent Medical Services you may have received from other than [facility] providers in the past year (date may be approximate).     Assessment:   This is a routine wellness examination for Brooke Chavez.  Hearing/Vision screen Hearing Screening - Comments:: Denies hearing difficulties   Vision Screening - Comments:: No vision problems; will schedule routine eye exam soon     Goals Addressed             This Visit's Progress    Prevent falls         Depression Screen     10/06/2023    8:22 AM 04/14/2023  11:16 AM  10/01/2022    2:55 PM 09/19/2022    4:39 PM 03/07/2022   12:25 PM 08/29/2021    4:55 PM 08/26/2021    3:28 PM  PHQ 2/9 Scores  PHQ - 2 Score  3 0 0 0 1 1  PHQ- 9 Score  7 0 0 0 3 2  Exception Documentation Medical reason          Fall Risk     10/06/2023    8:23 AM 04/14/2023   11:16 AM 10/01/2022    2:54 PM 09/19/2022    4:39 PM 03/07/2022   12:25 PM  Fall Risk   Falls in the past year? 0 0 0 0 0  Number falls in past yr: 0  0    Injury with Fall? 0  0    Risk for fall due to : Impaired mobility;Impaired balance/gait  No Fall Risks    Follow up Falls prevention discussed;Education provided;Falls evaluation completed  Falls prevention discussed      MEDICARE RISK AT HOME:  Medicare Risk at Home Any stairs in or around the home?: No If so, are there any without handrails?: No Home free of loose throw rugs in walkways, pet beds, electrical cords, etc?: Yes Adequate lighting in your home to reduce risk of falls?: Yes Life alert?: No Use of a cane, walker or w/c?: Yes Grab bars in the bathroom?: Yes Shower chair or bench in shower?: Yes Elevated toilet seat or a handicapped toilet?: Yes  TIMED UP AND GO:   Was the test performed?  No  Cognitive Function: Impaired: Patient is unable to complete screening 6CIT or other cognitive screening test. Patient is nonverbal post CVA     08/29/2021    5:02 PM 05/19/2017   11:57 AM 07/25/2015    9:07 AM  MMSE - Mini Mental State Exam  Not completed: Unable to complete;Refused    Orientation to time  4 5  Orientation to Place  5 5  Registration  3 3  Attention/ Calculation  5 5  Recall  3 3  Language- name 2 objects  2 2  Language- repeat  1 1  Language- follow 3 step command  3 3  Language- read & follow direction  1 1  Write a sentence  1 1  Copy design  1 1  Total score  29 30        10/01/2022    2:57 PM  6CIT Screen  What Year? 0 points  What month? 0 points  What time? 3 points  Count back from 20 2 points  Months in  reverse 2 points  Repeat phrase 0 points  Total Score 7 points    Immunizations Immunization History  Administered Date(s) Administered   PFIZER(Purple Top)SARS-COV-2 Vaccination 09/02/2019, 09/23/2019, 07/06/2020   PPD Test 01/11/2020   Pneumococcal Polysaccharide-23 04/29/2011   Tdap 04/29/2011    Screening Tests Health Maintenance  Topic Date Due   Zoster Vaccines- Shingrix (1 of 2) Never done   Pneumonia Vaccine 65+ Years old (2 of 2 - PCV) 04/28/2012   Cervical Cancer Screening (Pap smear)  09/19/2016   DEXA SCAN  07/12/2017   DTaP/Tdap/Td (2 - Td or Tdap) 04/28/2021   COVID-19 Vaccine (4 - 2024-25 season) 04/12/2023   INFLUENZA VACCINE  11/09/2023 (Originally 03/12/2023)   Medicare Annual Wellness (AWV)  10/05/2024   HPV VACCINES  Aged Out    Health Maintenance  Health Maintenance Due  Topic Date Due  Zoster Vaccines- Shingrix (1 of 2) Never done   Pneumonia Vaccine 58+ Years old (2 of 2 - PCV) 04/28/2012   Cervical Cancer Screening (Pap smear)  09/19/2016   DEXA SCAN  07/12/2017   DTaP/Tdap/Td (2 - Td or Tdap) 04/28/2021   COVID-19 Vaccine (4 - 2024-25 season) 04/12/2023   Health Maintenance Items Addressed: Declines vaccines   Additional Screening:  Vision Screening: Recommended annual ophthalmology exams for early detection of glaucoma and other disorders of the eye.  Dental Screening: Recommended annual dental exams for proper oral hygiene  Community Resource Referral / Chronic Care Management: CRR required this visit?  No   CCM required this visit?  No     Plan:     I have personally reviewed and noted the following in the patient's chart:   Medical and social history Use of alcohol, tobacco or illicit drugs  Current medications and supplements including opioid prescriptions. Patient is not currently taking opioid prescriptions. Functional ability and status Nutritional status Physical activity Advanced directives List of other  physicians Hospitalizations, surgeries, and ER visits in previous 12 months Vitals Screenings to include cognitive, depression, and falls Referrals and appointments  In addition, I have reviewed and discussed with patient certain preventive protocols, quality metrics, and best practice recommendations. A written personalized care plan for preventive services as well as general preventive health recommendations were provided to patient.     Kandis Fantasia [place], [place]   1/61/0960   After Visit Summary: (MyChart) Due to this being a telephonic visit, the after visit summary with patients personalized plan was offered to patient via MyChart   Notes: Nothing significant to report at this time.

## 2023-10-14 ENCOUNTER — Other Ambulatory Visit: Payer: Self-pay | Admitting: Nurse Practitioner

## 2023-10-14 DIAGNOSIS — I10 Essential (primary) hypertension: Secondary | ICD-10-CM

## 2023-10-16 ENCOUNTER — Telehealth: Payer: Self-pay | Admitting: Nurse Practitioner

## 2023-10-16 NOTE — Telephone Encounter (Signed)
 Paperwork was dropped off by patients daughter for Adult Daycare that patient goes to. Wants to know if it has been filled out yet?

## 2023-10-19 ENCOUNTER — Ambulatory Visit (INDEPENDENT_AMBULATORY_CARE_PROVIDER_SITE_OTHER): Payer: PRIVATE HEALTH INSURANCE | Admitting: Nurse Practitioner

## 2023-10-19 ENCOUNTER — Encounter: Payer: Self-pay | Admitting: Nurse Practitioner

## 2023-10-19 ENCOUNTER — Ambulatory Visit: Payer: 59

## 2023-10-19 VITALS — BP 138/76 | HR 78 | Temp 98.1°F | Ht 63.0 in | Wt 164.0 lb

## 2023-10-19 DIAGNOSIS — F5101 Primary insomnia: Secondary | ICD-10-CM

## 2023-10-19 DIAGNOSIS — R600 Localized edema: Secondary | ICD-10-CM

## 2023-10-19 DIAGNOSIS — I10 Essential (primary) hypertension: Secondary | ICD-10-CM | POA: Diagnosis not present

## 2023-10-19 DIAGNOSIS — N1831 Chronic kidney disease, stage 3a: Secondary | ICD-10-CM

## 2023-10-19 DIAGNOSIS — Z6830 Body mass index (BMI) 30.0-30.9, adult: Secondary | ICD-10-CM | POA: Diagnosis not present

## 2023-10-19 DIAGNOSIS — I69398 Other sequelae of cerebral infarction: Secondary | ICD-10-CM

## 2023-10-19 DIAGNOSIS — I69391 Dysphagia following cerebral infarction: Secondary | ICD-10-CM | POA: Diagnosis not present

## 2023-10-19 DIAGNOSIS — I5032 Chronic diastolic (congestive) heart failure: Secondary | ICD-10-CM | POA: Diagnosis not present

## 2023-10-19 DIAGNOSIS — K219 Gastro-esophageal reflux disease without esophagitis: Secondary | ICD-10-CM | POA: Diagnosis not present

## 2023-10-19 DIAGNOSIS — F482 Pseudobulbar affect: Secondary | ICD-10-CM

## 2023-10-19 DIAGNOSIS — I480 Paroxysmal atrial fibrillation: Secondary | ICD-10-CM

## 2023-10-19 DIAGNOSIS — R001 Bradycardia, unspecified: Secondary | ICD-10-CM

## 2023-10-19 DIAGNOSIS — M810 Age-related osteoporosis without current pathological fracture: Secondary | ICD-10-CM

## 2023-10-19 DIAGNOSIS — R269 Unspecified abnormalities of gait and mobility: Secondary | ICD-10-CM

## 2023-10-19 DIAGNOSIS — E785 Hyperlipidemia, unspecified: Secondary | ICD-10-CM | POA: Diagnosis not present

## 2023-10-19 DIAGNOSIS — I69351 Hemiplegia and hemiparesis following cerebral infarction affecting right dominant side: Secondary | ICD-10-CM

## 2023-10-19 DIAGNOSIS — I7409 Other arterial embolism and thrombosis of abdominal aorta: Secondary | ICD-10-CM

## 2023-10-19 MED ORDER — FUROSEMIDE 40 MG PO TABS: 40.0000 mg | ORAL_TABLET | Freq: Every day | ORAL | 1 refills | Status: DC

## 2023-10-19 MED ORDER — TRAZODONE HCL 50 MG PO TABS: 50.0000 mg | ORAL_TABLET | Freq: Every day | ORAL | 1 refills | Status: DC

## 2023-10-19 MED ORDER — LOSARTAN POTASSIUM 100 MG PO TABS: 100.0000 mg | ORAL_TABLET | Freq: Every day | ORAL | 1 refills | Status: DC

## 2023-10-19 MED ORDER — AMLODIPINE BESYLATE 5 MG PO TABS
5.0000 mg | ORAL_TABLET | Freq: Every day | ORAL | 1 refills | Status: DC
Start: 2023-10-19 — End: 2024-06-16

## 2023-10-19 MED ORDER — APIXABAN 5 MG PO TABS: 5.0000 mg | ORAL_TABLET | Freq: Two times a day (BID) | ORAL | 5 refills | Status: DC

## 2023-10-19 MED ORDER — ROSUVASTATIN CALCIUM 10 MG PO TABS: 10.0000 mg | ORAL_TABLET | Freq: Every day | ORAL | 1 refills | Status: DC

## 2023-10-19 NOTE — Patient Instructions (Signed)
 Fall Prevention in the Home, Adult Falls can cause injuries and can happen to people of all ages. There are many things you can do to make your home safer and to help prevent falls. What actions can I take to prevent falls? General information Use good lighting in all rooms. Make sure to: Replace any light bulbs that burn out. Turn on the lights in dark areas and use night-lights. Keep items that you use often in easy-to-reach places. Lower the shelves around your home if needed. Move furniture so that there are clear paths around it. Do not use throw rugs or other things on the floor that can make you trip. If any of your floors are uneven, fix them. Add color or contrast paint or tape to clearly mark and help you see: Grab bars or handrails. First and last steps of staircases. Where the edge of each step is. If you use a ladder or stepladder: Make sure that it is fully opened. Do not climb a closed ladder. Make sure the sides of the ladder are locked in place. Have someone hold the ladder while you use it. Know where your pets are as you move through your home. What can I do in the bathroom?     Keep the floor dry. Clean up any water on the floor right away. Remove soap buildup in the bathtub or shower. Buildup makes bathtubs and showers slippery. Use non-skid mats or decals on the floor of the bathtub or shower. Attach bath mats securely with double-sided, non-slip rug tape. If you need to sit down in the shower, use a non-slip stool. Install grab bars by the toilet and in the bathtub and shower. Do not use towel bars as grab bars. What can I do in the bedroom? Make sure that you have a light by your bed that is easy to reach. Do not use any sheets or blankets on your bed that hang to the floor. Have a firm chair or bench with side arms that you can use for support when you get dressed. What can I do in the kitchen? Clean up any spills right away. If you need to reach something  above you, use a step stool with a grab bar. Keep electrical cords out of the way. Do not use floor polish or wax that makes floors slippery. What can I do with my stairs? Do not leave anything on the stairs. Make sure that you have a light switch at the top and the bottom of the stairs. Make sure that there are handrails on both sides of the stairs. Fix handrails that are broken or loose. Install non-slip stair treads on all your stairs if they do not have carpet. Avoid having throw rugs at the top or bottom of the stairs. Choose a carpet that does not hide the edge of the steps on the stairs. Make sure that the carpet is firmly attached to the stairs. Fix carpet that is loose or worn. What can I do on the outside of my home? Use bright outdoor lighting. Fix the edges of walkways and driveways and fix any cracks. Clear paths of anything that can make you trip, such as tools or rocks. Add color or contrast paint or tape to clearly mark and help you see anything that might make you trip as you walk through a door, such as a raised step or threshold. Trim any bushes or trees on paths to your home. Check to see if handrails are loose  or broken and that both sides of all steps have handrails. Install guardrails along the edges of any raised decks and porches. Have leaves, snow, or ice cleared regularly. Use sand, salt, or ice melter on paths if you live where there is ice and snow during the winter. Clean up any spills in your garage right away. This includes grease or oil spills. What other actions can I take? Review your medicines with your doctor. Some medicines can cause dizziness or changes in blood pressure, which increase your risk of falling. Wear shoes that: Have a low heel. Do not wear high heels. Have rubber bottoms and are closed at the toe. Feel good on your feet and fit well. Use tools that help you move around if needed. These include: Canes. Walkers. Scooters. Crutches. Ask  your doctor what else you can do to help prevent falls. This may include seeing a physical therapist to learn to do exercises to move better and get stronger. Where to find more information Centers for Disease Control and Prevention, STEADI: TonerPromos.no [employer] on Aging: BaseRingTones.pl National Institute on Aging: BaseRingTones.pl Contact a doctor if: You are afraid of falling at home. You feel weak, drowsy, or dizzy at home. You fall at home. Get help right away if you: Lose consciousness or have trouble moving after a fall. Have a fall that causes a head injury. These symptoms may be an emergency. Get help right away. Call 911. Do not wait to see if the symptoms will go away. Do not drive yourself to the hospital. This information is not intended to replace advice given to you by your health care provider. Make sure you discuss any questions you have with your health care provider. Document Revised: 03/31/2022 Document Reviewed: 03/31/2022 Elsevier Patient Education  2024 [employer].

## 2023-10-19 NOTE — Telephone Encounter (Signed)
 Paperwork has already been picked up.

## 2023-10-19 NOTE — Addendum Note (Signed)
 Addended by: Bennie Pierini on: 10/19/2023 04:38 PM   Modules accepted: Orders

## 2023-10-19 NOTE — Progress Notes (Signed)
 Subjective:    Patient ID: Brooke Chavez, female    DOB: [birth year], 83 y.o.   MRN: [id number]   Chief Complaint: medical management of chronic issues     HPI:  Brooke Chavez is a 83 y.o. who identifies as a female who was assigned female at birth.   Social history: Lives with: daughters Work history: disabled   Comes in today for follow up of the following chronic medical issues:  1. Essential hypertension No c/o chest pain, sob or headache. Does not check blood pressure at home. BP Readings from Last 3 Encounters:  08/27/23 131/69  04/14/23 (!) 142/78  12/17/22 128/78     2. Chronic diastolic congestive heart failure (HCC) 3. Junctional bradycardia 4. PAF (paroxysmal atrial fibrillation) (HCC) 5. Aortic occlusive disease Denies heart racing or palpitations. No syncopal  or near syncopal episodes. Last saw cardiology on 12/17/22. She had edema the day she saw cardiology so he increased her lasix by 20mg  for a few days.   6. Dysphagia, post-stroke No issues  7. Gastroesophageal reflux disease without esophagitis On no meds. Has no recent complaints  8. 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 Gait disturbance, post-stroke 10. PBA (pseudobulbar affect) Is on eliquis with no issues. She still cries occasionally but is doing well. She as had no recent falls despite hr gait issues. She uses walker to get around  11. Hyperlipidemia with target LDL less than 100 Has very poor appetite and will only eat a few things. Her diet does not change much from day to day. She does no exercise at all. Lab Results  Component Value Date   CHOL 173 04/14/2023   HDL 54 04/14/2023   LDLCALC 100 (H) 04/14/2023   TRIG 108 04/14/2023   CHOLHDL 3.2 04/14/2023     12. Stage 3a chronic kidney disease (HCC) No voiding issues Lab Results  Component Value Date   CREATININE 1.84 (H) 04/14/2023     13. Age-related osteoporosis without current  pathological fracture No weight bearing exercise. Last bone density test was in 2016. Dont feel that necessary  14. BMI 30.0-30.9,adult Weight is unchanged . Wt Readings from Last 3 Encounters:  10/06/23 161 lb (73 kg)  08/27/23 161 lb 1.6 oz (73.1 kg)  04/14/23 168 lb (76.2 kg)   BMI Readings from Last 3 Encounters:  10/06/23 28.52 kg/m  08/27/23 28.54 kg/m  04/14/23 29.76 kg/m      New complaints: None today  Allergies  Allergen Reactions   Oxycodone-Acetaminophen Anxiety and Other (See Comments)    Hallucinations   Aspirin Nausea And Vomiting and Nausea Only    Other reaction(s): GI Upset (intolerance)   Outpatient Encounter Medications as of 10/19/2023  Medication Sig   acetaminophen (TYLENOL) 325 MG tablet Take 2 tablets (650 mg total) by mouth every 6 (six) hours as needed for mild pain (or Fever >/= 101).   amLODipine (NORVASC) 5 MG tablet Take 1 tablet (5 mg total) by mouth daily.   apixaban (ELIQUIS) 5 MG TABS tablet Take 1 tablet (5 mg total) by mouth 2 (two) times daily.   furosemide (LASIX) 40 MG tablet Take 1 tablet (40 mg total) by mouth daily.   losartan (COZAAR) 100 MG tablet Take 1 tablet (100 mg total) by mouth daily.   rosuvastatin (CRESTOR) 10 MG tablet Take 1 tablet (10 mg total) by mouth daily.   traZODone (DESYREL) 50 MG tablet Take 1 tablet (50 mg total)  by mouth at bedtime.   No facility-administered encounter medications on file as of 10/19/2023.    Past Surgical History:  Procedure Laterality Date   A FLUTTER ABLATION N/A 06/27/2011   Procedure: ABLATION A FLUTTER;  Surgeon: Hillis Range, MD;  Location: [facility] CATH LAB;  Service: Cardiovascular;  Laterality: N/A;   ATRIAL ABLATION SURGERY  06/2010   HIP SURGERY     Left (fracture) 3/13   JOINT REPLACEMENT     both knees    KNEE ARTHROSCOPY     Right   LUMBAR SPINE SURGERY     MITRAL VALVE REPAIR Right 01/07/2018   Procedure: MINIMALLY INVASIVE MITRAL VALVE REPAIR using LivaNova ring size  28 MM;  Surgeon: Purcell Nails, MD;  Location: [facility] OR;  Service: Open Heart Surgery;  Laterality: Right;   PATENT FORAMEN OVALE(PFO) CLOSURE N/A 01/07/2018   Procedure: PATENT FORAMEN OVALE (PFO) CLOSURE;  Surgeon: Purcell Nails, MD;  Location: [facility] OR;  Service: Open Heart Surgery;  Laterality: N/A;   RIGHT/LEFT HEART CATH AND CORONARY ANGIOGRAPHY N/A 11/11/2017   Procedure: RIGHT/LEFT HEART CATH AND CORONARY ANGIOGRAPHY;  Surgeon: Tonny Bollman, MD;  Location: [facility] INVASIVE CV LAB;  Service: Cardiovascular;  Laterality: N/A;   TEE WITHOUT CARDIOVERSION N/A 09/18/2017   Procedure: TRANSESOPHAGEAL ECHOCARDIOGRAM (TEE);  Surgeon: Chilton Si, MD;  Location: [facility] ENDOSCOPY;  Service: Cardiovascular;  Laterality: N/A;   TEE WITHOUT CARDIOVERSION N/A 01/07/2018   Procedure: TRANSESOPHAGEAL ECHOCARDIOGRAM (TEE);  Surgeon: Purcell Nails, MD;  Location: [facility] OR;  Service: Open Heart Surgery;  Laterality: N/A;   TOTAL KNEE ARTHROPLASTY     Left   TUBAL LIGATION      Family History  Problem Relation Age of Onset   Hypertension Mother    Cancer Sister        leukemia   Diabetes Brother    Stroke Sister    Diabetes Brother    Heart attack Brother    Healthy Daughter    Healthy Son    Healthy Daughter       Controlled substance contract: n/a     Review of Systems  Constitutional:  Negative for diaphoresis.  Eyes:  Negative for pain.  Respiratory:  Negative for shortness of breath.   Cardiovascular:  Negative for chest pain, palpitations and leg swelling.  Gastrointestinal:  Negative for abdominal pain.  Endocrine: Negative for polydipsia.  Skin:  Negative for rash.  Neurological:  Negative for dizziness, weakness and headaches.  Hematological:  Does not bruise/bleed easily.  All other systems reviewed and are negative.      Objective:   Physical Exam Vitals and nursing note reviewed.  Constitutional:      General: She is not in acute distress.    Appearance: Normal  appearance. She is well-developed.  HENT:     Head: Normocephalic.     Right Ear: Tympanic membrane normal.     Left Ear: Tympanic membrane normal.     Nose: Nose normal.     Mouth/Throat:     Mouth: Mucous membranes are moist.  Eyes:     Pupils: Pupils are equal, round, and reactive to light.  Neck:     Vascular: No carotid bruit or JVD.  Cardiovascular:     Rate and Rhythm: Normal rate. Rhythm irregular.     Heart sounds: Normal heart sounds.  Pulmonary:     Effort: Pulmonary effort is normal. No respiratory distress.     Breath sounds: Normal breath sounds. No wheezing or rales.  Chest:     Chest wall: No tenderness.  Abdominal:     General: Bowel sounds are normal. There is no distension or abdominal bruit.     Palpations: Abdomen is soft. There is no hepatomegaly, splenomegaly, mass or pulsatile mass.     Tenderness: There is no abdominal tenderness.  Musculoskeletal:        General: Normal range of motion.     Cervical back: Normal range of motion and neck supple.     Right lower leg: Edema (2+) present.     Left lower leg: Edema (2+) present.     Comments: Gait slow and steady with walker  Lymphadenopathy:     Cervical: No cervical adenopathy.  Skin:    General: Skin is warm and dry.  Neurological:     Mental Status: She is alert and oriented to person, place, and time.     Deep Tendon Reflexes: Reflexes are normal and symmetric.  Psychiatric:        Behavior: Behavior normal.        Thought Content: Thought content normal.        Judgment: Judgment normal.     LMP 11/05/1992    BP 138/76   Pulse 78   Temp 98.1 F (36.7 C) (Temporal)   Ht 5\' 3"  (1.6 m)   Wt 164 lb (74.4 kg)   LMP 11/05/1992   BMI 29.05 kg/m          Assessment & Plan:   Brooke Chavez comes in today with chief complaint of medical management of chronic issues    Diagnosis and orders addressed:  1. Essential hypertension Low sodium diet - amLODipine (NORVASC) 5 MG tablet;  Take 1 tablet (5 mg total) by mouth daily.  Dispense: 90 tablet; Refill: 1 - losartan (COZAAR) 100 MG tablet; Take 1 tablet (100 mg total) by mouth daily. (NEEDS TO BE SEEN BEFORE NEXT REFILL)  Dispense: 90 tablet; Refill: 1  2. Chronic diastolic congestive heart failure (HCC) 3. Junctional bradycardia 4. PAF (paroxysmal atrial fibrillation) (HCC) Keep followup with cardiology - apixaban (ELIQUIS) 5 MG TABS tablet; Take 1 tablet (5 mg total) by mouth 2 (two) times daily.  Dispense: 60 tablet; Refill: 5  5. Dysphagia, post-stroke Chew food well  6. Gastroesophageal reflux disease without esophagitis Avoid spicy foods Do not eat 2 hours prior to bedtime   7. 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 Gait disturbance, post-stroke Fall precautions  9. PBA (pseudobulbar affect)   10. Hyperlipidemia with target LDL less than 100 Low fat diet - rosuvastatin (CRESTOR) 10 MG tablet; Take 1 tablet (10 mg total) by mouth daily.  Dispense: 90 tablet; Refill: 1  11. Stage 3a chronic kidney disease (HCC) Labs pending  12. Age-related osteoporosis without current pathological fracture  13. BMI 30.0-30.9,adult Discussed diet and exercise for person with BMI >25 Will recheck weight in 3-6 months   14. Aortoiliac occlusive disease (HCC)  15. Peripheral edema Elevate legs when sitting - furosemide (LASIX) 40 MG tablet; Take 1 tablet (40 mg total) by mouth daily.  Dispense: 90 tablet; Refill: 1  16. Primary insomnia Bedtime routine - traZODone (DESYREL) 50 MG tablet; Take 1 tablet (50 mg total) by mouth at bedtime.  Dispense: 90 tablet; Refill: 1   Labs pending Health Maintenance reviewed Diet and exercise encouraged  Follow up plan: 6 months   Mary-Margaret Daphine Deutscher, FNP

## 2023-10-20 LAB — CBC WITH DIFFERENTIAL/PLATELET
Basophils Absolute: 0 10*3/uL (ref 0.0–0.2)
Basos: 0 %
EOS (ABSOLUTE): 0 10*3/uL (ref 0.0–0.4)
Eos: 0 %
Hematocrit: 36.9 % (ref 34.0–46.6)
Hemoglobin: 12.1 g/dL (ref 11.1–15.9)
Immature Grans (Abs): 0 10*3/uL (ref 0.0–0.1)
Immature Granulocytes: 1 %
Lymphocytes Absolute: 2.4 10*3/uL (ref 0.7–3.1)
Lymphs: 47 %
MCH: 30.3 pg (ref 26.6–33.0)
MCHC: 32.8 g/dL (ref 31.5–35.7)
MCV: 92 fL (ref 79–97)
Monocytes Absolute: 0.3 10*3/uL (ref 0.1–0.9)
Monocytes: 6 %
Neutrophils Absolute: 2.3 10*3/uL (ref 1.4–7.0)
Neutrophils: 46 %
Platelets: 136 10*3/uL — ABNORMAL LOW (ref 150–450)
RBC: 4 x10E6/uL (ref 3.77–5.28)
RDW: 13.5 % (ref 11.7–15.4)
WBC: 5.1 10*3/uL (ref 3.4–10.8)

## 2023-10-20 LAB — CMP14+EGFR
ALT: 23 IU/L (ref 0–32)
AST: 47 IU/L — ABNORMAL HIGH (ref 0–40)
Albumin: 4.5 g/dL (ref 3.7–4.7)
Alkaline Phosphatase: 110 IU/L (ref 44–121)
BUN/Creatinine Ratio: 10 — ABNORMAL LOW (ref 12–28)
BUN: 16 mg/dL (ref 8–27)
Bilirubin Total: 0.5 mg/dL (ref 0.0–1.2)
CO2: 20 mmol/L (ref 20–29)
Calcium: 10 mg/dL (ref 8.7–10.3)
Chloride: 99 mmol/L (ref 96–106)
Creatinine, Ser: 1.64 mg/dL — ABNORMAL HIGH (ref 0.57–1.00)
Globulin, Total: 3.9 g/dL (ref 1.5–4.5)
Glucose: 92 mg/dL (ref 70–99)
Potassium: 4.6 mmol/L (ref 3.5–5.2)
Sodium: 142 mmol/L (ref 134–144)
Total Protein: 8.4 g/dL (ref 6.0–8.5)
eGFR: 31 mL/min/{1.73_m2} — ABNORMAL LOW (ref >59)

## 2023-10-20 LAB — LIPID PANEL
Chol/HDL Ratio: 2.8 ratio (ref 0.0–4.4)
Cholesterol, Total: 187 mg/dL (ref 100–199)
HDL: 68 mg/dL (ref >39)
LDL Chol Calc (NIH): 101 mg/dL — ABNORMAL HIGH (ref 0–99)
Triglycerides: 103 mg/dL (ref 0–149)
VLDL Cholesterol Cal: 18 mg/dL (ref 5–40)

## 2023-11-13 ENCOUNTER — Ambulatory Visit: Admitting: Family

## 2023-11-13 ENCOUNTER — Encounter: Payer: Self-pay | Admitting: Family

## 2023-11-13 VITALS — BP 138/58 | HR 63 | Temp 98.1°F | Ht 63.0 in | Wt 162.0 lb

## 2023-11-13 DIAGNOSIS — R350 Frequency of micturition: Secondary | ICD-10-CM

## 2023-11-13 DIAGNOSIS — R399 Unspecified symptoms and signs involving the genitourinary system: Secondary | ICD-10-CM

## 2023-11-13 MED ORDER — CEPHALEXIN 500 MG PO CAPS: 500.0000 mg | ORAL_CAPSULE | Freq: Two times a day (BID) | ORAL | 0 refills | Status: DC

## 2023-11-13 NOTE — Patient Instructions (Signed)

## 2023-11-13 NOTE — Progress Notes (Signed)
 Subjective:    Patient ID: Brooke Chavez, female    DOB: 18-Sep-1940, 83 y.o.   MRN: [id number]  Chief Complaint  Patient presents with   Urinary Frequency   PT presents to the office today with urinary frequency and nocturia.  Urinary Frequency  This is a new problem. The current episode started 1 to 4 weeks ago. The problem occurs every urination. The problem has been gradually worsening. Associated symptoms include frequency and urgency. Pertinent negatives include no hesitancy, nausea or vomiting. She has tried increased fluids for the symptoms. The treatment provided mild relief.      Review of Systems  Unable to perform ROS: Dementia  Gastrointestinal:  Negative for nausea and vomiting.  Genitourinary:  Positive for frequency and urgency. Negative for hesitancy.    Social History   Socioeconomic History   Marital status: Widowed    Spouse name: Not on file   Number of children: 3   Years of education: 71   Highest education level: Not on file  Occupational History    Comment: na  Tobacco Use   Smoking status: Never   Smokeless tobacco: Never  Vaping Use   Vaping status: Never Used  Substance and Sexual Activity   Alcohol use: No   Drug use: No   Sexual activity: Never  Other Topics Concern   Not on file  Social History Narrative   Her daughter lives with her - Brooke Chavez   Patient unable to talk   Little caffeine   Social Drivers of Health   Financial Resource Strain: Low Risk  (10/06/2023)   Overall Financial Resource Strain (CARDIA)    Difficulty of Paying Living Expenses: Not hard at all  Food Insecurity: No Food Insecurity (10/06/2023)   Hunger Vital Sign    Worried About Running Out of Food in the Last Year: Never true    Ran Out of Food in the Last Year: Never true  Transportation Needs: No Transportation Needs (10/06/2023)   PRAPARE - Administrator, Civil Service (Medical): No    Lack of Transportation (Non-Medical): No  Physical  Activity: Inactive (10/06/2023)   Exercise Vital Sign    Days of Exercise per Week: 0 days    Minutes of Exercise per Session: 0 min  Stress: No Stress Concern Present (10/06/2023)   [employer] of Occupational Health - Occupational Stress Questionnaire    Feeling of Stress : Not at all  Social Connections: Moderately Integrated (10/06/2023)   Social Connection and Isolation Panel [NHANES]    Frequency of Communication with Friends and Family: More than three times a week    Frequency of Social Gatherings with Friends and Family: Three times a week    Attends Religious Services: More than 4 times per year    Active Member of Clubs or Organizations: Yes    Attends Banker Meetings: More than 4 times per year    Marital Status: Widowed   Family History  Problem Relation Age of Onset   Hypertension Mother    Cancer Sister        leukemia   Diabetes Brother    Stroke Sister    Diabetes Brother    Heart attack Brother    Healthy Daughter    Healthy Son    Healthy Daughter         Objective:   Physical Exam Vitals reviewed.  Constitutional:      General: She is not in acute distress.  Appearance: She is well-developed. She is obese.  HENT:     Head: Normocephalic and atraumatic.  Eyes:     Pupils: Pupils are equal, round, and reactive to light.  Neck:     Thyroid: No thyromegaly.  Cardiovascular:     Rate and Rhythm: Normal rate and regular rhythm.     Heart sounds: Normal heart sounds. No murmur heard. Pulmonary:     Effort: Pulmonary effort is normal. No respiratory distress.     Breath sounds: Normal breath sounds. No wheezing.  Abdominal:     General: Bowel sounds are normal. There is no distension.     Palpations: Abdomen is soft.     Tenderness: There is no abdominal tenderness.  Musculoskeletal:        General: No tenderness. Normal range of motion.     Cervical back: Normal range of motion and neck supple.     Right lower leg: Edema (3+)  present.     Left lower leg: Edema (3+) present.  Skin:    General: Skin is warm and dry.  Neurological:     Mental Status: She is alert. She is disoriented.     Cranial Nerves: No cranial nerve deficit.     Deep Tendon Reflexes: Reflexes are normal and symmetric.  Psychiatric:        Mood and Affect: Affect is not flat.        Speech: She is noncommunicative.        Behavior: Behavior normal.        Thought Content: Thought content normal.        Cognition and Memory: Cognition is impaired.        Judgment: Judgment normal.       BP (!) 138/58   Pulse 63   Temp 98.1 F (36.7 C) (Temporal)   Ht 5\' 3"  (1.6 m)   Wt 162 lb (73.5 kg)   LMP 11/05/1992   SpO2 97%   BMI 28.70 kg/m      Assessment & Plan:  Brooke Chavez comes in today with chief complaint of Urinary Frequency   Diagnosis and orders addressed:  1. Urinary frequency (Primary) - Urinalysis, Routine w reflex microscopic - Urine Culture  2. UTI symptoms Force fluids AZO over the counter X2 days RTO prn Pt unable to leave urine today. Given it is the weekend will treat with keflex. Urine cup given to bring back if sample if symptoms do not not improve or worsen.  - cephALEXin (KEFLEX) 500 MG capsule; Take 1 capsule (500 mg total) by mouth 2 (two) times daily.  Dispense: 14 capsule; Refill: 0    Jannifer Rodney, FNP

## 2023-12-28 ENCOUNTER — Ambulatory Visit (INDEPENDENT_AMBULATORY_CARE_PROVIDER_SITE_OTHER): Admitting: Family

## 2023-12-28 ENCOUNTER — Encounter: Payer: Self-pay | Admitting: Family

## 2023-12-28 ENCOUNTER — Ambulatory Visit: Payer: Self-pay

## 2023-12-28 VITALS — BP 134/61 | HR 61 | Temp 98.0°F | Ht 63.0 in | Wt 160.6 lb

## 2023-12-28 DIAGNOSIS — I69320 Aphasia following cerebral infarction: Secondary | ICD-10-CM | POA: Diagnosis not present

## 2023-12-28 DIAGNOSIS — R399 Unspecified symptoms and signs involving the genitourinary system: Secondary | ICD-10-CM

## 2023-12-28 DIAGNOSIS — R319 Hematuria, unspecified: Secondary | ICD-10-CM

## 2023-12-28 MED ORDER — CEPHALEXIN 500 MG PO CAPS: 500.0000 mg | ORAL_CAPSULE | Freq: Two times a day (BID) | ORAL | 0 refills | Status: DC

## 2023-12-28 NOTE — Telephone Encounter (Signed)
 noted

## 2023-12-28 NOTE — Patient Instructions (Signed)
 Asymptomatic Bacteriuria Asymptomatic bacteriuria is when you have a lot of germs called bacteria in your pee (urine), but they don't cause symptoms.  You may not need treatment for this condition. But you may be more likely to get an infection in the future. What are the causes? You may get more germs in your pee because of: Germs going into your urinary tract. Your urinary tract includes your kidneys, ureters, bladder, and urethra. Germs can get into your urinary tract during sex. A blockage in your urinary tract. This may be from a kidney stone or from an abnormal growth of cells called a tumor. Bladder problems that stop the bladder from emptying. What increases the risk? You're more likely to get this condition if: You have diabetes. You're an older adult. You're even more likely to get it if you live in a long-term care facility. You're female. You're pregnant. You have kidney stones. You've had a kidney transplant. You have a soft tube called a catheter that drains your pee. What are the signs or symptoms? There are no symptoms. How is this diagnosed? This condition is diagnosed with a pee test. It may be found when a sample of pee is taken to treat another condition, such as a problem with your kidney. If you're in your first trimester of pregnancy, you may be screened for this condition. How is this treated? In most cases, treatment isn't needed. Treating the condition can lead to other problems, such as: A yeast infection. The growth of antibiotic-resistant bacteria. These are germs that don't respond to treatment. But some people need to take antibiotics to prevent a kidney infection. You may need treatment if: You're having a procedure that affects your urinary tract. You've had a kidney transplant. You're pregnant. A kidney infection during pregnancy can lead to: Early labor. Very low birth weight. Newborn death. Follow these instructions at home: Medicines Take your  medicines only as told. Take your antibiotics as told. Do not stop taking them even if you start to feel better. General instructions Closely watch your condition for any changes. Drink enough fluid to keep your pee pale yellow. Pee more often to keep your bladder empty. If you're female, keep the area around your vagina and butt clean. Wipe from front to back after you pee or poop. Use each tissue only once when you wipe. Contact a health care provider if: You have symptoms of an infection. These symptoms may include: A burning feeling, or pain when you pee. A strong need to pee. Peeing more often. Pee that turns discolored or cloudy. Blood in your pee. Pee that smells bad or odd. A fever or chills. You have very bad pain in your back or lower belly. You faint. This information is not intended to replace advice given to you by your health care provider. Make sure you discuss any questions you have with your health care provider. Document Revised: 12/02/2022 Document Reviewed: 12/02/2022 Elsevier Patient Education  2024 [employer].

## 2023-12-28 NOTE — Progress Notes (Signed)
 Subjective:    Patient ID: Brooke Chavez, female    DOB: [birth year], 83 y.o.   MRN: [id number]  Chief Complaint  Patient presents with   Hematuria   Pt presents to the office today with hematuria that she noticed last night. She has hx of CVS and has right sided weakness.  Hematuria This is a new problem. The current episode started yesterday. The problem is unchanged. Irritative symptoms do not include frequency, nocturia or urgency. Pertinent negatives include no chills, dysuria, fever, flank pain, nausea, urinary retention or vomiting.      Review of Systems  Constitutional:  Negative for chills and fever.  Gastrointestinal:  Negative for nausea and vomiting.  Genitourinary:  Positive for hematuria. Negative for dysuria, flank pain, frequency, nocturia and urgency.  All other systems reviewed and are negative.   Social History   Socioeconomic History   Marital status: Widowed    Spouse name: Not on file   Number of children: 3   Years of education: 24   Highest education level: Not on file  Occupational History    Comment: na  Tobacco Use   Smoking status: Never   Smokeless tobacco: Never  Vaping Use   Vaping status: Never Used  Substance and Sexual Activity   Alcohol use: No   Drug use: No   Sexual activity: Never  Other Topics Concern   Not on file  Social History Narrative   Her daughter lives with her - Merleen Stare   Patient unable to talk   Little caffeine   Social Drivers of Health   Financial Resource Strain: Low Risk  (10/06/2023)   Overall Financial Resource Strain (CARDIA)    Difficulty of Paying Living Expenses: Not hard at all  Food Insecurity: No Food Insecurity (10/06/2023)   Hunger Vital Sign    Worried About Running Out of Food in the Last Year: Never true    Ran Out of Food in the Last Year: Never true  Transportation Needs: No Transportation Needs (10/06/2023)   PRAPARE - Administrator, Civil Service (Medical): No    Lack  of Transportation (Non-Medical): No  Physical Activity: Inactive (10/06/2023)   Exercise Vital Sign    Days of Exercise per Week: 0 days    Minutes of Exercise per Session: 0 min  Stress: No Stress Concern Present (10/06/2023)   [employer] of Occupational Health - Occupational Stress Questionnaire    Feeling of Stress : Not at all  Social Connections: Moderately Integrated (10/06/2023)   Social Connection and Isolation Panel [NHANES]    Frequency of Communication with Friends and Family: More than three times a week    Frequency of Social Gatherings with Friends and Family: Three times a week    Attends Religious Services: More than 4 times per year    Active Member of Clubs or Organizations: Yes    Attends Banker Meetings: More than 4 times per year    Marital Status: Widowed   Family History  Problem Relation Age of Onset   Hypertension Mother    Cancer Sister        leukemia   Diabetes Brother    Stroke Sister    Diabetes Brother    Heart attack Brother    Healthy Daughter    Healthy Son    Healthy Daughter         Objective:   Physical Exam Vitals reviewed.  Constitutional:      General:  She is not in acute distress.    Appearance: She is well-developed.  HENT:     Head: Normocephalic and atraumatic.  Eyes:     Pupils: Pupils are equal, round, and reactive to light.  Neck:     Thyroid : No thyromegaly.  Cardiovascular:     Rate and Rhythm: Normal rate and regular rhythm.     Heart sounds: Normal heart sounds. No murmur heard. Pulmonary:     Effort: Pulmonary effort is normal. No respiratory distress.     Breath sounds: Normal breath sounds. No wheezing.  Abdominal:     General: Bowel sounds are normal. There is no distension.     Palpations: Abdomen is soft.     Tenderness: There is no abdominal tenderness.  Musculoskeletal:        General: No tenderness.     Cervical back: Normal range of motion and neck supple.     Right lower leg:  Edema (3+) present.     Left lower leg: Edema (3+) present.  Skin:    General: Skin is warm and dry.  Neurological:     Mental Status: She is alert and oriented to person, place, and time.     Cranial Nerves: No cranial nerve deficit.     Motor: Weakness present.     Gait: Gait abnormal (using rollign walker).     Deep Tendon Reflexes: Reflexes are normal and symmetric.  Psychiatric:        Cognition and Memory: Cognition is impaired. Memory is impaired. She exhibits impaired recent memory and impaired remote memory.        Judgment: Judgment is inappropriate.       BP 134/61   Pulse 61   Temp 98 F (36.7 C) (Temporal)   Ht 5\' 3"  (1.6 m)   Wt 160 lb 9.6 oz (72.8 kg)   LMP 11/05/1992   BMI 28.45 kg/m      Assessment & Plan:  Rawan SHAQUELA WEICHERT comes in today with chief complaint of Hematuria   Diagnosis and orders addressed:  1. Hematuria, unspecified type (Primary) - Urinalysis, Complete - Urine Culture; Future  2. UTI symptoms - cephALEXin  (KEFLEX ) 500 MG capsule; Take 1 capsule (500 mg total) by mouth 2 (two) times daily.  Dispense: 14 capsule; Refill: 0  3. 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 Will bring sample back. Will not start keflex  until after urine sample.  Continue current medications  Force fluids Keep follow up with PCP  Tommas Fragmin, FNP

## 2023-12-28 NOTE — Telephone Encounter (Signed)
 Copied from CRM [id number]. Topic: Clinical - Red Word Triage >> Dec 28, 2023 12:32 PM Brittney F wrote: Red Word that prompted transfer to Nurse Triage:  blood in her urine (noticed last night); blood in her  urine noticed by home aide( said it was a lot)   Chief Complaint: Blood in urine  Symptoms: Blood in urine  Frequency: Two episodes that daughter knows about  Pertinent Negatives: Patient denies pain Disposition: [] ED /[x] Urgent Care (no appt availability in office) / [] Appointment(In office/virtual)/ []  [facility]/ [] Home Care/ [] Refused Recommended Disposition /[] [facility]/ []  Follow-up with PCP Additional Notes: Patient's daughter called to report that the patient began to complain of blood in her urine last night. She states that today a caretaker noticed "a lot of blood" present after the patient urinated. She states that the patient has not complained of any pain but that she has decreased sensation due to a previous stroke. I advised that with the patient's blood in urine as well as her taking Eliquis  that the patient should be seen today. She states that the patient would not be able to make the available appointments today and that she will take the patient to urgent care for evaluation.     Reason for Disposition  Taking Coumadin  (warfarin) or other strong blood thinner, or known bleeding disorder (e.g., thrombocytopenia)  Answer Assessment - Initial Assessment Questions 1. COLOR of URINE: "Describe the color of the urine."  (e.g., tea-colored, pink, red, bloody) "Do you have blood clots in your urine?" (e.g., none, pea, grape, small coin)     Red 2. ONSET: "When did the bleeding start?"      Last night  3. EPISODES: "How many times has there been blood in the urine?" or "How many times today?"     At least 2 4. PAIN with URINATION: "Is there any pain with passing your urine?" If Yes, ask: "How bad is the pain?"  (Scale 1-10; or mild, moderate, severe)     - MILD: Complains slightly about urination hurting.    - MODERATE: Interferes with normal activities.      - SEVERE: Excruciating, unwilling or unable to urinate because of the pain.      No pain reported  5. FEVER: "Do you have a fever?" If Yes, ask: "What is your temperature, how was it measured, and when did it start?"     No 6. ASSOCIATED SYMPTOMS: "Are you passing urine more frequently than usual?"     No 7. OTHER SYMPTOMS: "Do you have any other symptoms?" (e.g., back/flank pain, abdomen pain, vomiting)     No  Protocols used: Urine - Blood In-A-AH

## 2023-12-29 ENCOUNTER — Other Ambulatory Visit

## 2023-12-29 DIAGNOSIS — R319 Hematuria, unspecified: Secondary | ICD-10-CM | POA: Diagnosis not present

## 2023-12-29 LAB — URINALYSIS, COMPLETE
Bilirubin, UA: NEGATIVE
Glucose, UA: NEGATIVE
Ketones, UA: NEGATIVE
Nitrite, UA: NEGATIVE
Protein,UA: NEGATIVE
RBC, UA: NEGATIVE
Specific Gravity, UA: 1.01 (ref 1.005–1.030)
Urobilinogen, Ur: 0.2 mg/dL (ref 0.2–1.0)
pH, UA: 6 (ref 5.0–7.5)

## 2023-12-29 LAB — MICROSCOPIC EXAMINATION
Epithelial Cells (non renal): NONE SEEN /HPF (ref 0–10)
RBC, Urine: NONE SEEN /HPF (ref 0–2)
Renal Epithel, UA: NONE SEEN /HPF
Yeast, UA: NONE SEEN

## 2023-12-31 ENCOUNTER — Ambulatory Visit: Payer: Self-pay | Admitting: Family

## 2024-01-01 LAB — URINE CULTURE

## 2024-04-12 ENCOUNTER — Telehealth: Payer: Self-pay | Admitting: Family Medicine

## 2024-04-12 ENCOUNTER — Other Ambulatory Visit: Payer: Self-pay

## 2024-04-12 DIAGNOSIS — R300 Dysuria: Secondary | ICD-10-CM

## 2024-04-12 NOTE — Telephone Encounter (Signed)
 Spoke with caregiver. MMM nurse wants her to bring in urine sample

## 2024-04-12 NOTE — Telephone Encounter (Signed)
 Copied from CRM [id number]. Topic: Clinical - Medication Question >> Apr 12, 2024  8:26 AM Susanna ORN wrote: Reason for CRM: Patient's daughter, Nat, called in stating that she talked to the nurse on call over the weekend. States her mom had a UTI & was previously prescribed cephalexin  a while back. Nat stated that she had some pills left and she took those. She wants to see if Dr. Marjie can prescribe some more cephalexin  for her mom. She also stated that she had urine in her blood as well but has since died down a whole lot. Nat states she was told by the nurse on call that she would have someone to contact her within 2 hours but they didn't. She is requesting that Dr. Marjie give her a call as well. Please give Nat a call back. CB #: [phone].

## 2024-04-12 NOTE — Telephone Encounter (Signed)
 Needs to be seen

## 2024-04-13 ENCOUNTER — Other Ambulatory Visit

## 2024-04-13 DIAGNOSIS — R300 Dysuria: Secondary | ICD-10-CM

## 2024-04-13 NOTE — Telephone Encounter (Signed)
 Patients daughter called to see if she could still get the prescription for Cephalexin  before the urine specimen results come back. Daughter said she would have the specimen dropped off today.

## 2024-04-14 ENCOUNTER — Telehealth: Payer: Self-pay | Admitting: Nurse Practitioner

## 2024-04-14 LAB — URINALYSIS, ROUTINE W REFLEX MICROSCOPIC
Bilirubin, UA: NEGATIVE
Glucose, UA: NEGATIVE
Ketones, UA: NEGATIVE
Nitrite, UA: NEGATIVE
Specific Gravity, UA: 1.02 (ref 1.005–1.030)
Urobilinogen, Ur: 0.2 mg/dL (ref 0.2–1.0)
pH, UA: 5.5 (ref 5.0–7.5)

## 2024-04-14 MED ORDER — SULFAMETHOXAZOLE-TRIMETHOPRIM 800-160 MG PO TABS: 1.0000 | ORAL_TABLET | Freq: Two times a day (BID) | ORAL | 0 refills | Status: DC

## 2024-04-14 NOTE — Telephone Encounter (Signed)
 Patient having urinary symptoms.they are unable to bring her into office during regular hours. They brought urine in yesterday and looks bad. Culture was ordered. Meds ordered this encounter  Medications   sulfamethoxazole -trimethoprim  (BACTRIM  DS) 800-160 MG tablet    Sig: Take 1 tablet by mouth 2 (two) times daily.    Dispense:  14 tablet    Refill:  0    Supervising Provider:   DETTINGER, JOSHUA A [[id number]]   Take medication as prescribe Cotton underwear Take shower not bath Cranberry juice, yogurt Force fluids AZO over the counter X2 days Culture pending RTO prn  Mary-Margaret Gladis, FNP

## 2024-04-15 ENCOUNTER — Ambulatory Visit: Payer: Self-pay | Admitting: Nurse Practitioner

## 2024-04-16 LAB — URINE CULTURE

## 2024-04-18 ENCOUNTER — Ambulatory Visit: Payer: PRIVATE HEALTH INSURANCE | Admitting: Nurse Practitioner

## 2024-04-18 ENCOUNTER — Ambulatory Visit (INDEPENDENT_AMBULATORY_CARE_PROVIDER_SITE_OTHER): Payer: PRIVATE HEALTH INSURANCE

## 2024-04-18 ENCOUNTER — Other Ambulatory Visit: Payer: Self-pay | Admitting: Nurse Practitioner

## 2024-04-18 DIAGNOSIS — Z13820 Encounter for screening for osteoporosis: Secondary | ICD-10-CM

## 2024-04-18 DIAGNOSIS — Z780 Asymptomatic menopausal state: Secondary | ICD-10-CM

## 2024-04-26 ENCOUNTER — Ambulatory Visit: Payer: Self-pay

## 2024-04-26 DIAGNOSIS — M8589 Other specified disorders of bone density and structure, multiple sites: Secondary | ICD-10-CM | POA: Diagnosis not present

## 2024-04-26 DIAGNOSIS — Z780 Asymptomatic menopausal state: Secondary | ICD-10-CM | POA: Diagnosis not present

## 2024-04-26 NOTE — Telephone Encounter (Signed)
 contacted patients daughter and was told not much can do. Blood will reabsorb

## 2024-04-26 NOTE — Telephone Encounter (Signed)
 Appt made.

## 2024-04-26 NOTE — Telephone Encounter (Signed)
 Daughter called back. Triaged in another encounter

## 2024-04-26 NOTE — Telephone Encounter (Signed)
 2nd attempt. No answer. Left voicemail on daughter's [place]; on [facility]) phone to return call for nurse triage.

## 2024-04-26 NOTE — Telephone Encounter (Signed)
 FYI Only or Action Required?: FYI only for provider.  Patient was last seen in primary care on 12/28/2023 by Lavell Bari LABOR, FNP.  Called Nurse Triage reporting Eye Problem.  Symptoms began Sunday evening.  Interventions attempted: Nothing.  Symptoms are: unchanged.  Triage Disposition: See PCP When Office is Open (Within 3 Days)  Patient/caregiver understands and will follow disposition?: Yes  Reason for Disposition  Bleeding on white of the eye  Answer Assessment - Initial Assessment Questions 1. LOCATION: Where is the redness? (e.g., eyeball or outer eyelids) Note: When callers say the eye is red, they usually mean the sclera (white of the eye) is red.       Corner of left eye 2. ONE OR BOTH EYES: Is the redness in one or both eyes?      Left eye 3. ONSET: When did the redness start? (e.g., hours, days)      Sunday 4. EYELIDS: Are the eyelids red or swollen? If Yes, ask: How much?      no 5. VISION: Do you have blurred vision?     Patient isn't able to say if she has blurred vision 6. ITCHING: Does it feel itchy? If so ask: How bad is it (Scale 1-10; or mild, moderate, severe)     Will itch at the eye when family is looking at it but other than that won't mess with her eye 7. PAIN: Is it painful? If Yes, ask: How bad is the pain? (Scale 0-10; or none, mild, moderate, severe)     Unable to tell if she is having pain 8. CONTACT LENS: Do you wear contacts?     no 9. CAUSE: What do you think is causing the redness?     unsure 10. OTHER SYMPTOMS: Do you have any other symptoms? (e.g., fever, runny nose, cough, vomiting)       No  Patient has a history of stroke back in 2019-daughter states she noticed redness to the corner of her left eye on Sunday. Daughter is concerned that something might be going on. Patient is unable to say if she is having pain or if anything is wrong with her vision.  Protocols used: Eye - Redness-A-AH

## 2024-04-26 NOTE — Telephone Encounter (Signed)
 1st attempt. No answer, left voicemail for daughter [place], on [place]) to call back for nurse triage.  Copied from CRM [id number]. Topic: Clinical - Medical Advice >> Apr 26, 2024  8:03 AM Avram MATSU wrote: Reason for CRM: daughter Etta stated hr mom has a blood vessel that popped in her left eye. She would like to know what to do., no other symptoms besides red eye. Please advise [phone].

## 2024-04-26 NOTE — Telephone Encounter (Signed)
 3rd attempt, no answer. Left voicemail for Brooke Chavez to call back for nurse triage. Routing to clinic.  Reason for Disposition  Third attempt to contact caller AND no contact made. Phone number verified.  Protocols used: No Contact or Duplicate Contact Call-A-AH

## 2024-04-27 ENCOUNTER — Telehealth: Payer: Self-pay

## 2024-04-27 ENCOUNTER — Ambulatory Visit: Admitting: Family Medicine

## 2024-04-27 NOTE — Telephone Encounter (Signed)
 The patient's appointment scheduled for 08/26/2024 has been rescheduled to 08/25/2024 due to the provider's altered office hours. A reminder letter has been sent to the patient.

## 2024-04-28 ENCOUNTER — Encounter: Payer: Self-pay | Admitting: Nurse Practitioner

## 2024-05-05 ENCOUNTER — Ambulatory Visit: Payer: Self-pay

## 2024-05-05 NOTE — Telephone Encounter (Signed)
 FYI Only or Action Required?: FYI only for provider.  Patient was last seen in primary care on 12/28/2023 by Lavell Bari LABOR, FNP.  Called Nurse Triage reporting Fall.  Symptoms began Sunday.  Interventions attempted: Nothing.  Symptoms are: stable.  Triage Disposition: No disposition on file.  Patient/caregiver understands and will follow disposition?:

## 2024-05-05 NOTE — Telephone Encounter (Signed)
 Copied from CRM #[id number]. Topic: Clinical - Red Word Triage >> May 05, 2024  8:52 AM Myrick T wrote: Red Word that prompted transfer to Nurse Triage: daughter Etta called said patient has a bruise/knot on her bottom and behind her left leg from a fall on Sunday. She also has had a cough since Friday but patient does not understand to spit the phlegm out so daughter doesn't know what color it is Reason for Disposition . [1] Small swelling or lump AND [2] unexplained AND [3] present < 1 week  Answer Assessment - Initial Assessment Questions 1. MECHANISM: How did the fall happen?     Went to sit back on cough and was too far away from cough and fell on cough and slid to floor  2. DOMESTIC VIOLENCE AND ELDER ABUSE SCREENING: Did you fall because someone pushed you or tried to hurt you? If Yes, ask: Are you safe now?     no 3. ONSET: When did the fall happen? (e.g., minutes, hours, or days ago)     Sunday  4. LOCATION: What part of the body hit the ground? (e.g., back, buttocks, head, hips, knees, hands, head, stomach)     Buttocks  5. INJURY: Did you hurt (injure) yourself when you fell? If Yes, ask: What did you injure? Tell me more about this? (e.g., body area; type of injury; pain severity)    Knot on left cheek 6. PAIN: Is there any pain? If Yes, ask: How bad is the pain? (e.g., Scale 0-10; or none, mild,      no 7. SIZE: For cuts, bruises, or swelling, ask: How large is it? (e.g., inches or centimeters)      Can't see it only on palpation quarter sized  8. PREGNANCY: Is there any chance you are pregnant? When was your last menstrual period?     N/a 9. OTHER SYMPTOMS: Do you have any other symptoms? (e.g., dizziness, fever, weakness; new-onset or worsening).      Productive cough no SOB, no wheezing  10. CAUSE: What do you think caused the fall (or falling)? (e.g., dizzy spell, tripped)       Mistook the distance to the cough  Answer Assessment - Initial  Assessment Questions 1. APPEARANCE of SWELLING: What does it look like?     Skint with brusing  2. SIZE: How large is the swelling? (e.g., inches, cm; or compare to size of pinhead, tip of pen, eraser, coin, pea, grape, ping pong ball)      quarter 3. LOCATION: Where is the swelling located?     Mid left buttock  4. ONSET: When did the swelling start?     Sunday after fall 5. COLOR: What color is it? Is there more than one color?     Skin colored 6. PAIN: Is there any pain? If Yes, ask: How bad is the pain? (Scale 1-10; or mild, moderate, severe)       no 7. ITCH: Does it itch? If Yes, ask: How bad is the itch?      no 8. CAUSE: What do you think caused the swelling?    Sustained form fall 9 OTHER SYMPTOMS: Do you have any other symptoms? (e.g., fever)    Congested cough  Protocols used: Falls and Falling-A-AH, Skin Lump or Localized Swelling-A-AH

## 2024-05-05 NOTE — Telephone Encounter (Signed)
 Appt made.

## 2024-05-06 ENCOUNTER — Ambulatory Visit: Admitting: Family Medicine

## 2024-05-06 ENCOUNTER — Other Ambulatory Visit: Payer: Self-pay | Admitting: Nurse Practitioner

## 2024-05-06 ENCOUNTER — Ambulatory Visit (INDEPENDENT_AMBULATORY_CARE_PROVIDER_SITE_OTHER)

## 2024-05-06 VITALS — BP 146/76 | HR 64 | Temp 98.0°F | Ht 63.0 in | Wt 148.0 lb

## 2024-05-06 DIAGNOSIS — I69320 Aphasia following cerebral infarction: Secondary | ICD-10-CM

## 2024-05-06 DIAGNOSIS — K5900 Constipation, unspecified: Secondary | ICD-10-CM | POA: Diagnosis not present

## 2024-05-06 DIAGNOSIS — R051 Acute cough: Secondary | ICD-10-CM

## 2024-05-06 DIAGNOSIS — S300XXA Contusion of lower back and pelvis, initial encounter: Secondary | ICD-10-CM

## 2024-05-06 DIAGNOSIS — I480 Paroxysmal atrial fibrillation: Secondary | ICD-10-CM

## 2024-05-06 DIAGNOSIS — R7981 Abnormal blood-gas level: Secondary | ICD-10-CM | POA: Diagnosis not present

## 2024-05-06 DIAGNOSIS — R0602 Shortness of breath: Secondary | ICD-10-CM | POA: Diagnosis not present

## 2024-05-06 DIAGNOSIS — R059 Cough, unspecified: Secondary | ICD-10-CM | POA: Diagnosis not present

## 2024-05-06 DIAGNOSIS — K6389 Other specified diseases of intestine: Secondary | ICD-10-CM | POA: Diagnosis not present

## 2024-05-06 MED ORDER — DOXYCYCLINE HYCLATE 100 MG PO TABS: 100.0000 mg | ORAL_TABLET | Freq: Two times a day (BID) | ORAL | 0 refills | Status: AC

## 2024-05-06 NOTE — Progress Notes (Signed)
 Acute Office Visit  Subjective:     Patient ID: Brooke Chavez, female    DOB: [birth year], 83 y.o.   MRN: [id number]  Chief Complaint  Patient presents with   Cough    HPI  History of Present Illness   Brooke Chavez is an 83 year old female with a history of prior CVA is here with daughter for a cough. History was difficult to obtain due to aphasia.   Cough and respiratory symptoms - Persistent productive cough since last Saturday, worsening - Sputum color unspecified - No fever - No chest pain - Mild shortness of breath - No history of chronic lung disease such as COPD or asthma - Low oxygen  levels today initially then improved to upper 90s even after walking. Suspect low oxygen  sats were due to cold hangs.  - No edema  Gastrointestinal symptoms - Unsure of last BM - Aide has not noted constipation or diarrhea - No nausea or vomiting, except for a single episode of vomiting associated with coughing - Denies abdominal pain.  Hematoma - Hematoma present on buttock - Getting smaller - She misjudged distance to couch and slid to the floot.   Constitutional and neurological symptoms - No dizziness or lightheadedness       ROS As per HPI.      Objective:    BP (!) 146/76   Pulse 64   Temp 98 F (36.7 C) (Temporal)   Ht 5' 3 (1.6 m)   Wt 148 lb (67.1 kg)   LMP 11/05/1992   SpO2 99%   BMI 26.22 kg/m  SpO2 Readings from Last 3 Encounters:  05/06/24 (!) 89%  11/13/23 97%  04/14/23 96%      Physical Exam Vitals and nursing note reviewed.  Constitutional:      General: She is not in acute distress.    Appearance: She is not toxic-appearing.  HENT:     Right Ear: Tympanic membrane, ear canal and external ear normal.     Left Ear: Tympanic membrane, ear canal and external ear normal.     Nose: Congestion present.     Mouth/Throat:     Mouth: Mucous membranes are moist.     Pharynx: Oropharynx is clear. No oropharyngeal exudate or posterior  oropharyngeal erythema.  Eyes:     General:        Right eye: No discharge.        Left eye: No discharge.     Conjunctiva/sclera: Conjunctivae normal.  Cardiovascular:     Rate and Rhythm: Regular rhythm.     Heart sounds: Normal heart sounds. No murmur heard. Pulmonary:     Effort: Pulmonary effort is normal. No respiratory distress.     Breath sounds: Normal breath sounds. No stridor. No wheezing, rhonchi or rales.  Abdominal:     General: Bowel sounds are normal. There is no distension.     Palpations: Abdomen is soft.     Tenderness: There is no abdominal tenderness. There is no guarding or rebound.  Musculoskeletal:     Cervical back: Neck supple. No rigidity.     Right lower leg: No edema.     Left lower leg: No edema.  Skin:    General: Skin is warm and dry.     Comments: 1 cm hematoma present to right buttock.   Neurological:     Mental Status: She is alert. Mental status is at baseline.     Cranial Nerves: No cranial nerve deficit.  Motor: Weakness (using rolling walker) present.  Psychiatric:        Mood and Affect: Mood normal.        Behavior: Behavior normal.     No results found for any visits on 05/06/24.      Assessment & Plan:   Adjoa was seen today for cough.  Diagnoses and all orders for this visit:  Acute cough -     DG Chest 2 View; Future -     doxycycline  (VIBRA -TABS) 100 MG tablet; Take 1 tablet (100 mg total) by mouth 2 (two) times daily for 7 days.  Low oxygen  saturation -     DG Chest 2 View; Future -     doxycycline  (VIBRA -TABS) 100 MG tablet; Take 1 tablet (100 mg total) by mouth 2 (two) times daily for 7 days.  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 No pneumonia on x-ray. Suspect low oxygen  saturation initially was due to cold hands.  - Prescribed doxycycline   with food. - Recommended Mucinex every 12 hours, avoid Mucinex DM.  Constipation with colonic dilation Colonic dilation on xray noted, likely due to constipation, no signs of obstruction or significant symptoms. - Monitor bowel movement frequency. - Recommend Miralax once to twice daily as needed.  Hematoma of buttock Hematoma likely from fall, stable in size, no infection signs. - Monitor hematoma size.      Return to office for new or worsening symptoms, or if symptoms persist.   The patient indicates understanding of these issues and agrees with the plan.  Brooke Chavez Search, FNP

## 2024-05-09 ENCOUNTER — Encounter: Payer: Self-pay | Admitting: Family Medicine

## 2024-05-12 ENCOUNTER — Ambulatory Visit: Payer: Self-pay

## 2024-05-12 NOTE — Telephone Encounter (Signed)
 FYI Only or Action Required?: FYI only for provider.  Patient was last seen in primary care on 05/06/2024 by Joesph Annabella HERO, FNP.  Called Nurse Triage reporting Cough.  Symptoms began a week ago.  Interventions attempted: Prescription medications: Doxycycline .  Symptoms are: stable.  Triage Disposition: See PCP When Office is Open (Within 3 Days)  Patient/caregiver understands and will follow disposition?: Yes Reason for Disposition  Cough has been present for > 3 weeks  Answer Assessment - Initial Assessment Questions Speaking with patients daughter. Started doxycycline  last week, finishing dose tomorrow. Patient's daughter concerned patient is not getting all the phlegm out and it's staying in her chest.  1. ONSET: When did the cough begin?      Over a week ago  2. SEVERITY: How bad is the cough today?      Deep cough   3. SPUTUM: Describe the color of your sputum (e.g., none, dry cough; clear, white, yellow, green)     Slight discoloration, off white  5. DIFFICULTY BREATHING: Are you having difficulty breathing? If Yes, ask: How bad is it? (e.g., mild, moderate, severe)      No   6. FEVER: Do you have a fever? If Yes, ask: What is your temperature, how was it measured, and when did it start?     No  10. OTHER SYMPTOMS: Do you have any other symptoms? (e.g., runny nose, wheezing, chest pain)       No  Protocols used: Cough - Acute Productive-A-AH  Copied from CRM #[id number]. Topic: Clinical - Medical Advice >> May 12, 2024 12:48 PM Sophia H wrote: Reason for CRM: Daughter Etta is calling in stating that last Friday patient was given meds to help with a cough she has. She is still having the cough and has started coughing up phlegm, states not discolored but she is worried given her mothers age. Wanting to know if she should be reevaluated or finish round of medication before that. Please advise # [phone]   doxycycline  (VIBRA -TABS) 100 MG tablet;  Take 1 tablet (100 mg total) by mouth 2 (two) times daily for 7 days.

## 2024-05-12 NOTE — Telephone Encounter (Signed)
 NOTED. PATIENT HAS AN APPT TOMORROW FOR THIS ISSUE.

## 2024-05-13 ENCOUNTER — Ambulatory Visit (INDEPENDENT_AMBULATORY_CARE_PROVIDER_SITE_OTHER): Admitting: Family Medicine

## 2024-05-13 ENCOUNTER — Encounter: Payer: Self-pay | Admitting: Family Medicine

## 2024-05-13 VITALS — BP 132/88 | HR 86 | Temp 97.7°F | Ht 63.0 in | Wt 145.0 lb

## 2024-05-13 DIAGNOSIS — R1110 Vomiting, unspecified: Secondary | ICD-10-CM

## 2024-05-13 DIAGNOSIS — I69391 Dysphagia following cerebral infarction: Secondary | ICD-10-CM

## 2024-05-13 NOTE — Progress Notes (Signed)
 Subjective:  Patient ID: Brooke Chavez, female    DOB: 01-22-1941, 83 y.o.   MRN: [id number]  Patient Care Team: Gladis Mustard, FNP as PCP - General (Nurse Practitioner) Lavona Agent, MD as PCP - Cardiology (Cardiology)   Chief Complaint:  Vomiting   HPI: Brooke Chavez is a 83 y.o. female presenting on 05/13/2024 for Vomiting   Brooke Chavez is an 83 year old female with a history of stroke and dysphagia who presents with episodes of vomiting and coughing.  She has experienced episodes of vomiting on Wednesday and Monday, with Wednesday's episode being more significant. Vomiting occurs only when she is with the caregiver and not with family members. There is concern that vomiting might be related to her medication, which requires food intake prior to administration. The vomit is suspected to be phlegm rather than food.  She has been coughing, seemingly associated with vomiting. The cough is infrequent and appears to be an attempt to clear phlegm. She struggles to expectorate the phlegm and often swallows it instead.  She has a history of dysphagia but does not require pureed foods and eats a regular diet. She is selective about her food intake, eating what she likes and refusing what she does not. She underwent speech therapy, including swallowing therapy, after a stroke. She prefers sleeping in a recliner as it is difficult for her to sleep in a bed.          Relevant past medical, surgical, family, and social history reviewed and updated as indicated.  Allergies and medications reviewed and updated. Data reviewed: Chart in Epic.   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   LMCA stroke post MV repair  01/24/18 (INR 1.7)   Liver masses 11/13/2017   Multiple small nodules seen on CT and MRI   Mitral regurgitation    severe   Pancreatic mass 11/13/2017   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   Complex valvuloplasty including artificial Gore-tex neochord placement x6 and 28 mm Sorin Memo 4D ring annuloplasty via right mini thoracotomy approach   Stroke (HCC)    TR (tricuspid regurgitation)    Mild with RA enlargment    Past Surgical History:  Procedure Laterality Date   A FLUTTER ABLATION N/A 06/27/2011   Procedure: ABLATION A FLUTTER;  Surgeon: Agent Rakers, MD;  Location: [facility] CATH LAB;  Service: Cardiovascular;  Laterality: N/A;   ATRIAL ABLATION SURGERY  06/2010   HIP SURGERY     Left (fracture) 3/13   JOINT REPLACEMENT     both knees    KNEE ARTHROSCOPY     Right   LUMBAR SPINE SURGERY     MITRAL VALVE REPAIR Right 01/07/2018   Procedure: MINIMALLY INVASIVE MITRAL VALVE REPAIR using LivaNova ring size 28 MM;  Surgeon: Dusty Sudie DEL, MD;  Location: [facility] OR;  Service: Open Heart Surgery;  Laterality: Right;   PATENT FORAMEN OVALE(PFO) CLOSURE N/A 01/07/2018  Procedure: PATENT FORAMEN OVALE (PFO) CLOSURE;  Surgeon: Dusty Sudie DEL, MD;  Location: [facility] OR;  Service: Open Heart Surgery;  Laterality: N/A;   RIGHT/LEFT HEART CATH AND CORONARY ANGIOGRAPHY N/A 11/11/2017   Procedure: RIGHT/LEFT HEART CATH AND CORONARY ANGIOGRAPHY;  Surgeon: Wonda Sharper, MD;  Location: [facility] INVASIVE CV LAB;  Service: Cardiovascular;  Laterality: N/A;   TEE WITHOUT CARDIOVERSION N/A 09/18/2017   Procedure: TRANSESOPHAGEAL ECHOCARDIOGRAM (TEE);  Surgeon: Raford Riggs, MD;  Location: [facility] ENDOSCOPY;  Service: Cardiovascular;  Laterality: N/A;   TEE WITHOUT CARDIOVERSION N/A 01/07/2018   Procedure: TRANSESOPHAGEAL ECHOCARDIOGRAM  (TEE);  Surgeon: Dusty Sudie DEL, MD;  Location: [facility] OR;  Service: Open Heart Surgery;  Laterality: N/A;   TOTAL KNEE ARTHROPLASTY     Left   TUBAL LIGATION      Social History   Socioeconomic History   Marital status: Widowed    Spouse name: Not on file   Number of children: 3   Years of education: 79   Highest education level: Not on file  Occupational History    Comment: na  Tobacco Use   Smoking status: Never   Smokeless tobacco: Never  Vaping Use   Vaping status: Never Used  Substance and Sexual Activity   Alcohol use: No   Drug use: No   Sexual activity: Never  Other Topics Concern   Not on file  Social History Narrative   Her daughter lives with her - Brooke Chavez   Patient unable to talk   Little caffeine   Social Drivers of Health   Financial Resource Strain: Low Risk  (10/06/2023)   Overall Financial Resource Strain (CARDIA)    Difficulty of Paying Living Expenses: Not hard at all  Food Insecurity: No Food Insecurity (10/06/2023)   Hunger Vital Sign    Worried About Running Out of Food in the Last Year: Never true    Ran Out of Food in the Last Year: Never true  Transportation Needs: No Transportation Needs (10/06/2023)   PRAPARE - Administrator, Civil Service (Medical): No    Lack of Transportation (Non-Medical): No  Physical Activity: Inactive (10/06/2023)   Exercise Vital Sign    Days of Exercise per Week: 0 days    Minutes of Exercise per Session: 0 min  Stress: No Stress Concern Present (10/06/2023)   [employer] of Occupational Health - Occupational Stress Questionnaire    Feeling of Stress : Not at all  Social Connections: Moderately Integrated (10/06/2023)   Social Connection and Isolation Panel    Frequency of Communication with Friends and Family: More than three times a week    Frequency of Social Gatherings with Friends and Family: Three times a week    Attends Religious Services: More than 4 times per year    Active Member  of Clubs or Organizations: Yes    Attends Banker Meetings: More than 4 times per year    Marital Status: Widowed  Intimate Partner Violence: Not At Risk (10/06/2023)   Humiliation, Afraid, Rape, and Kick questionnaire    Fear of Current or Ex-Partner: No    Emotionally Abused: No    Physically Abused: No    Sexually Abused: No    Outpatient Encounter Medications as of 05/13/2024  Medication Sig   acetaminophen  (TYLENOL ) 325 MG tablet Take 2 tablets (650 mg total) by mouth every 6 (six) hours as needed for mild pain (or Fever >/= 101).   amLODipine  (NORVASC ) 5 MG tablet Take 1  tablet (5 mg total) by mouth daily.   apixaban  (ELIQUIS ) 5 MG TABS tablet Take 1 tablet (5 mg total) by mouth 2 (two) times daily. **NEEDS TO BE SEEN BEFORE NEXT REFILL**   doxycycline  (VIBRA -TABS) 100 MG tablet Take 1 tablet (100 mg total) by mouth 2 (two) times daily for 7 days.   furosemide  (LASIX ) 40 MG tablet Take 1 tablet (40 mg total) by mouth daily.   losartan  (COZAAR ) 100 MG tablet Take 1 tablet (100 mg total) by mouth daily.   rosuvastatin  (CRESTOR ) 10 MG tablet Take 1 tablet (10 mg total) by mouth daily.   traZODone  (DESYREL ) 50 MG tablet Take 1 tablet (50 mg total) by mouth at bedtime.   No facility-administered encounter medications on file as of 05/13/2024.    Allergies  Allergen Reactions   Oxycodone -Acetaminophen  Anxiety and Other (See Comments)    Hallucinations   Aspirin  Nausea And Vomiting and Nausea Only    Other reaction(s): GI Upset (intolerance)    Pertinent ROS per HPI, otherwise unremarkable      Objective:  BP 132/88   Pulse 86   Temp 97.7 F (36.5 C) (Temporal)   Ht 5' 3 (1.6 m)   Wt 145 lb (65.8 kg)   LMP 11/05/1992   SpO2 94%   BMI 25.69 kg/m    Wt Readings from Last 3 Encounters:  05/13/24 145 lb (65.8 kg)  05/06/24 148 lb (67.1 kg)  12/28/23 160 lb 9.6 oz (72.8 kg)    Physical Exam Vitals and nursing note reviewed.  Constitutional:       Appearance: She is ill-appearing (chronically ill).  HENT:     Head: Normocephalic and atraumatic.     Nose: Nose normal.     Mouth/Throat:     Lips: Pink.     Mouth: Mucous membranes are moist.     Comments: Holding sputum in mouth Eyes:     Pupils: Pupils are equal, round, and reactive to light.  Cardiovascular:     Rate and Rhythm: Normal rate. Rhythm irregularly irregular.     Heart sounds: Normal heart sounds.  Pulmonary:     Effort: Pulmonary effort is normal.     Breath sounds: Normal breath sounds. No stridor. No wheezing, rhonchi or rales.  Musculoskeletal:     Right lower leg: No edema.     Left lower leg: No edema.  Skin:    General: Skin is warm and dry.     Capillary Refill: Capillary refill takes less than 2 seconds.  Neurological:     Mental Status: She is alert. Mental status is at baseline.     Gait: Gait abnormal (slow, using walker).  Psychiatric:        Cognition and Memory: Cognition is impaired. Memory is impaired.     Results for orders placed or performed in visit on 04/13/24  Urinalysis, Routine w reflex microscopic   Collection Time: 04/13/24  4:33 PM  Result Value Ref Range   Specific Gravity, UA 1.020 1.005 - 1.030   pH, UA 5.5 5.0 - 7.5   Color, UA Yellow Yellow   Appearance Ur Clear Clear   Leukocytes,UA 2+ (A) Negative   Protein,UA 1+ (A) Negative/Trace   Glucose, UA Negative Negative   Ketones, UA Negative Negative   RBC, UA 3+ (A) Negative   Bilirubin, UA Negative Negative   Urobilinogen, Ur 0.2 0.2 - 1.0 mg/dL   Nitrite, UA Negative Negative   Microscopic Examination CANCELED   Urine Culture  Collection Time: 04/13/24  4:58 PM   Specimen: Urine   UR  Result Value Ref Range   Urine Culture, Routine Final report (A)    Organism ID, Bacteria Comment (A)    Antimicrobial Susceptibility Comment        Pertinent labs & imaging results that were available during my care of the patient were reviewed by me and considered in my  medical decision making.  Assessment & Plan:  Brooke Chavez was seen today for vomiting.  Diagnoses and all orders for this visit:  Vomiting in adult -     For home use only DME Other see comment  Dysphagia, post-stroke -     For home use only DME Other see comment     Cough with phlegm production and vomiting Intermittent vomiting and coughing with phlegm production, likely due to ineffective expectoration of phlegm. Lungs are clear with good oxygen  saturation, indicating no aspiration. - Encourage increased water intake to thin phlegm. - Consider obtaining a bulb suction for oral secretions if symptoms worsen. - Prescribe a portable suction device.  Dysphagia Dysphagia post-stroke, currently able to eat regular foods without difficulty after completing speech and swallowing therapy.          Continue all other maintenance medications.  Follow up plan: Return if symptoms worsen or fail to improve.   Continue healthy lifestyle choices, including diet (rich in fruits, vegetables, and lean proteins, and low in salt and simple carbohydrates) and exercise (at least 30 minutes of moderate physical activity daily).   The above assessment and management plan was discussed with the patient. The patient verbalized understanding of and has agreed to the management plan. Patient is aware to call the clinic if they develop any new symptoms or if symptoms persist or worsen. Patient is aware when to return to the clinic for a follow-up visit. Patient educated on when it is appropriate to go to the emergency department.   Brooke Bruns, FNP-C [facility] [phone]

## 2024-05-25 ENCOUNTER — Telehealth: Payer: Self-pay

## 2024-05-25 ENCOUNTER — Inpatient Hospital Stay: Payer: PRIVATE HEALTH INSURANCE | Admitting: Family Medicine

## 2024-05-25 NOTE — Telephone Encounter (Signed)
 Left message for patient or daughter to call back. Need more information regarding patients appt for this afternoon. Its scheduled as a hospital follow up but I dont see any hospital admission or discharge notes. If she wasn't admitted to the hospital then the appt shouldn't be scheduled as a hospital follow up. It should be scheduled as a 30 min office visit. Did she go to the ER? Or was she just seen by EMS? ER Follow Up appts are also scheduled as 30 min office visits.

## 2024-05-30 ENCOUNTER — Ambulatory Visit: Payer: Self-pay

## 2024-05-30 ENCOUNTER — Ambulatory Visit: Payer: Self-pay | Admitting: *Deleted

## 2024-05-30 NOTE — Telephone Encounter (Signed)
 FYI Only or Action Required?: FYI only for provider.  Patient was last seen in primary care on 05/13/2024 by Severa Rock HERO, FNP.  Called Nurse Triage reporting Urinary Frequency.  Symptoms began several days ago.  Interventions attempted: Nothing.  Symptoms are: unchanged.  Triage Disposition: See Physician Within 24 Hours  Patient/caregiver understands and will follow disposition?: Yes          Copied from CRM #[id number]. Topic: Clinical - Red Word Triage >> May 30, 2024  3:39 PM Ivette P wrote: Red Word that prompted transfer to Nurse Triage: UTI symptoms >> May 30, 2024  4:00 PM Antwanette L wrote: Patient's daughter, Nat, called regarding concerns about her mother experiencing frequent urges to urinate without actual output, along with fatigue. The patient is requesting a urine sample cup to provide a specimen for evaluation. Reason for Disposition  Age > 50 years  Answer Assessment - Initial Assessment Questions 1. SEVERITY: How bad is the pain?  (e.g., Scale 1-10; mild, moderate, or severe)     Endorses urge and frequency 2. FREQUENCY: How many times have you had painful urination today?      About once an hour last night 3. PATTERN: Is pain present every time you urinate or just sometimes?      N/a 4. ONSET: When did the painful urination start?      A few days 5. FEVER: Do you have a fever? If Yes, ask: What is your temperature, how was it measured, and when did it start?     denies 6. PAST UTI: Have you had a urine infection before? If Yes, ask: When was the last time? and What happened that time?      Yes, unknown 7. CAUSE: What do you think is causing the painful urination?  (e.g., UTI, scratch, Herpes sore)     Uti  8. OTHER SYMPTOMS: Do you have any other symptoms? (e.g., blood in urine, flank pain, genital sores, urgency, vaginal discharge)     N/a 9. PREGNANCY: Is there any chance you are pregnant? When was your last menstrual  period?     N/a  Protocols used: Urination Pain - Female-A-AH

## 2024-05-30 NOTE — Telephone Encounter (Signed)
 Appt made.

## 2024-05-30 NOTE — Telephone Encounter (Signed)
 Copied from CRM #[id number]. Topic: Clinical - Red Word Triage >> May 30, 2024  3:39 PM Ivette P wrote: Red Word that prompted transfer to Nurse Triage: UTI symptoms  Call was connected with daughter- she asked if she could call back- she had to take a call on the other line.

## 2024-05-31 ENCOUNTER — Ambulatory Visit (INDEPENDENT_AMBULATORY_CARE_PROVIDER_SITE_OTHER): Admitting: Family Medicine

## 2024-05-31 ENCOUNTER — Encounter: Payer: Self-pay | Admitting: Family Medicine

## 2024-05-31 VITALS — BP 102/60 | HR 88 | Temp 97.1°F | Ht 63.0 in | Wt 143.2 lb

## 2024-05-31 DIAGNOSIS — R350 Frequency of micturition: Secondary | ICD-10-CM | POA: Diagnosis not present

## 2024-05-31 NOTE — Progress Notes (Signed)
 Subjective:  Patient ID: Brooke Chavez, female    DOB: Nov 09, 1940, 83 y.o.   MRN: [id number]  Patient Care Team: Gladis Mustard, FNP as PCP - General (Nurse Practitioner) Lavona Agent, MD as PCP - Cardiology (Cardiology)   Chief Complaint:  Urinary Frequency and Urinary Urgency (X 1 week )   HPI: Brooke Chavez is a 83 y.o. female presenting on 05/31/2024 for Urinary Frequency and Urinary Urgency (X 1 week )   Brooke Chavez is an 83 year old female who presents with urinary urgency and frequency.  Urinary symptoms - Urinary urgency and frequency, with episodes occurring every hour - Often unable to void despite frequent urge to urinate - No hematuria during this episode - No fever - History of hematuria with previous urinary tract infections  Decreased oral intake and weight loss - Decreased weight as observed by family - Very selective eating habits - Reduced intake of usual foods, including cereal - Eating only small amounts of food, such as nuts and yogurt  - Two pound weight loss over the last month          Relevant past medical, surgical, family, and social history reviewed and updated as indicated.  Allergies and medications reviewed and updated. Data reviewed: Chart in Epic.   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   LMCA stroke post MV repair  01/24/18 (INR 1.7)   Liver masses 11/13/2017   Multiple small nodules seen on CT and MRI   Mitral regurgitation    severe   Pancreatic mass 11/13/2017   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   Complex valvuloplasty including artificial Gore-tex neochord placement x6 and 28 mm Sorin Memo 4D ring annuloplasty via right mini thoracotomy approach   Stroke (HCC)    TR (tricuspid regurgitation)    Mild with RA enlargment    Past Surgical History:  Procedure Laterality Date   A FLUTTER ABLATION N/A 06/27/2011   Procedure: ABLATION A FLUTTER;  Surgeon: Agent Rakers, MD;  Location: [facility] CATH LAB;  Service: Cardiovascular;  Laterality: N/A;   ATRIAL ABLATION SURGERY  06/2010   HIP SURGERY     Left (fracture) 3/13   JOINT REPLACEMENT     both knees    KNEE ARTHROSCOPY     Right   LUMBAR SPINE SURGERY     MITRAL VALVE REPAIR Right 01/07/2018   Procedure: MINIMALLY INVASIVE MITRAL VALVE REPAIR using LivaNova ring size 28 MM;  Surgeon: Dusty Sudie DEL, MD;  Location: [facility] OR;  Service: Open Heart Surgery;  Laterality: Right;   PATENT FORAMEN OVALE(PFO) CLOSURE N/A 01/07/2018   Procedure: PATENT FORAMEN OVALE (PFO) CLOSURE;  Surgeon: Dusty Sudie DEL, MD;  Location: [facility] OR;  Service: Open Heart Surgery;  Laterality: N/A;   RIGHT/LEFT HEART CATH AND CORONARY ANGIOGRAPHY N/A 11/11/2017   Procedure: RIGHT/LEFT HEART CATH AND CORONARY ANGIOGRAPHY;  Surgeon: Wonda Sharper, MD;  Location: [facility] INVASIVE CV LAB;  Service:  Cardiovascular;  Laterality: N/A;   TEE WITHOUT CARDIOVERSION N/A 09/18/2017   Procedure: TRANSESOPHAGEAL ECHOCARDIOGRAM (TEE);  Surgeon: Raford Riggs, MD;  Location: [facility] ENDOSCOPY;  Service: Cardiovascular;  Laterality: N/A;   TEE WITHOUT CARDIOVERSION N/A 01/07/2018   Procedure: TRANSESOPHAGEAL ECHOCARDIOGRAM (TEE);  Surgeon: Dusty Sudie DEL, MD;  Location: [facility] OR;  Service: Open Heart Surgery;  Laterality: N/A;   TOTAL KNEE ARTHROPLASTY     Left   TUBAL LIGATION      Social History   Socioeconomic History   Marital status: Widowed    Spouse name: Not on file   Number of children: 3   Years of education: 51   Highest education level: Not on  file  Occupational History    Comment: na  Tobacco Use   Smoking status: Never   Smokeless tobacco: Never  Vaping Use   Vaping status: Never Used  Substance and Sexual Activity   Alcohol use: No   Drug use: No   Sexual activity: Never  Other Topics Concern   Not on file  Social History Narrative   Her daughter lives with her - Brooke Chavez   Patient unable to talk   Little caffeine   Social Drivers of Health   Financial Resource Strain: Low Risk  (10/06/2023)   Overall Financial Resource Strain (CARDIA)    Difficulty of Paying Living Expenses: Not hard at all  Food Insecurity: No Food Insecurity (10/06/2023)   Hunger Vital Sign    Worried About Running Out of Food in the Last Year: Never true    Ran Out of Food in the Last Year: Never true  Transportation Needs: No Transportation Needs (10/06/2023)   PRAPARE - Administrator, Civil Service (Medical): No    Lack of Transportation (Non-Medical): No  Physical Activity: Inactive (10/06/2023)   Exercise Vital Sign    Days of Exercise per Week: 0 days    Minutes of Exercise per Session: 0 min  Stress: No Stress Concern Present (10/06/2023)   [employer] of Occupational Health - Occupational Stress Questionnaire    Feeling of Stress : Not at all  Social Connections: Moderately Integrated (10/06/2023)   Social Connection and Isolation Panel    Frequency of Communication with Friends and Family: More than three times a week    Frequency of Social Gatherings with Friends and Family: Three times a week    Attends Religious Services: More than 4 times per year    Active Member of Clubs or Organizations: Yes    Attends Banker Meetings: More than 4 times per year    Marital Status: Widowed  Intimate Partner Violence: Not At Risk (10/06/2023)   Humiliation, Afraid, Rape, and Kick questionnaire    Fear of Current or Ex-Partner: No    Emotionally Abused: No    Physically Abused: No    Sexually Abused: No     Outpatient Encounter Medications as of 05/31/2024  Medication Sig   acetaminophen  (TYLENOL ) 325 MG tablet Take 2 tablets (650 mg total) by mouth every 6 (six) hours as needed for mild pain (or Fever >/= 101).   amLODipine  (NORVASC ) 5 MG tablet Take 1 tablet (5 mg total) by mouth daily.   apixaban  (ELIQUIS ) 5 MG TABS tablet Take 1 tablet (5 mg total) by mouth 2 (two) times daily. **NEEDS TO BE SEEN BEFORE NEXT REFILL**   furosemide  (LASIX ) 40 MG tablet Take 1 tablet (40 mg total) by mouth daily.   losartan  (COZAAR ) 100 MG  tablet Take 1 tablet (100 mg total) by mouth daily.   rosuvastatin  (CRESTOR ) 10 MG tablet Take 1 tablet (10 mg total) by mouth daily.   traZODone  (DESYREL ) 50 MG tablet Take 1 tablet (50 mg total) by mouth at bedtime.   No facility-administered encounter medications on file as of 05/31/2024.    Allergies  Allergen Reactions   Oxycodone -Acetaminophen  Anxiety and Other (See Comments)    Hallucinations   Aspirin  Nausea And Vomiting and Nausea Only    Other reaction(s): GI Upset (intolerance)    Pertinent ROS per HPI, otherwise unremarkable      Objective:  BP 102/60   Pulse 88   Temp (!) 97.1 F (36.2 C)   Ht 5' 3 (1.6 m)   Wt 143 lb 3.2 oz (65 kg)   LMP 11/05/1992   BMI 25.37 kg/m    Wt Readings from Last 3 Encounters:  05/31/24 143 lb 3.2 oz (65 kg)  05/13/24 145 lb (65.8 kg)  05/06/24 148 lb (67.1 kg)    Physical Exam Vitals and nursing note reviewed.  Constitutional:      General: She is not in acute distress.    Appearance: She is ill-appearing (chronically). She is not toxic-appearing or diaphoretic.  HENT:     Head: Normocephalic and atraumatic.     Mouth/Throat:     Mouth: Mucous membranes are moist.  Eyes:     Pupils: Pupils are equal, round, and reactive to light.  Cardiovascular:     Rate and Rhythm: Normal rate. Rhythm irregularly irregular.     Heart sounds: Normal heart sounds.  Pulmonary:     Effort: Pulmonary effort is  normal.     Breath sounds: Normal breath sounds.  Abdominal:     General: Bowel sounds are normal.     Palpations: Abdomen is soft.     Tenderness: There is no right CVA tenderness or left CVA tenderness.  Musculoskeletal:     Right lower leg: No edema.     Left lower leg: No edema.  Skin:    General: Skin is warm and dry.     Capillary Refill: Capillary refill takes less than 2 seconds.  Neurological:     Mental Status: She is alert. Mental status is at baseline.  Psychiatric:        Cognition and Memory: Cognition is impaired. Memory is impaired.      Results for orders placed or performed in visit on 04/13/24  Urinalysis, Routine w reflex microscopic   Collection Time: 04/13/24  4:33 PM  Result Value Ref Range   Specific Gravity, UA 1.020 1.005 - 1.030   pH, UA 5.5 5.0 - 7.5   Color, UA Yellow Yellow   Appearance Ur Clear Clear   Leukocytes,UA 2+ (A) Negative   Protein,UA 1+ (A) Negative/Trace   Glucose, UA Negative Negative   Ketones, UA Negative Negative   RBC, UA 3+ (A) Negative   Bilirubin, UA Negative Negative   Urobilinogen, Ur 0.2 0.2 - 1.0 mg/dL   Nitrite, UA Negative Negative   Microscopic Examination CANCELED   Urine Culture   Collection Time: 04/13/24  4:58 PM   Specimen: Urine   UR  Result Value Ref Range   Urine Culture, Routine Final report (A)    Organism ID, Bacteria Comment (A)    Antimicrobial Susceptibility Comment        Pertinent labs & imaging results that were available during my care of the patient were reviewed by me and considered  in my medical decision making.  Assessment & Plan:  Brooke Chavez was seen today for urinary frequency and urinary urgency.  Diagnoses and all orders for this visit:  Urinary frequency -     Urine Culture -     Urinalysis, Routine w reflex microscopic     Urinary frequency and urgency Urinary frequency and urgency without fever or hematuria. No increased weakness or confusion. Unable to provide urine sample  during visit. - Provide a urine collection cup for home use. - Instruct to refrigerate urine sample and return it the next day. - Advise to increase water intake and avoid caffeine. - Plan to start treatment based on urine test results.  Unintentional weight loss Reported weight loss of a few pounds. Picky eater with decreased cereal intake. Consumes nuts and yogurt. Advised on ways to increase oral intake.          Continue all other maintenance medications.  Follow up plan: Return if symptoms worsen or fail to improve.   The above assessment and management plan was discussed with the patient. The patient verbalized understanding of and has agreed to the management plan. Patient is aware to call the clinic if they develop any new symptoms or if symptoms persist or worsen. Patient is aware when to return to the clinic for a follow-up visit. Patient educated on when it is appropriate to go to the emergency department.   Brooke Bruns, FNP-C [facility] [phone]

## 2024-06-01 ENCOUNTER — Other Ambulatory Visit

## 2024-06-01 DIAGNOSIS — R350 Frequency of micturition: Secondary | ICD-10-CM | POA: Diagnosis not present

## 2024-06-02 ENCOUNTER — Ambulatory Visit: Payer: Self-pay | Admitting: Family Medicine

## 2024-06-02 DIAGNOSIS — N3001 Acute cystitis with hematuria: Secondary | ICD-10-CM

## 2024-06-02 LAB — MICROSCOPIC EXAMINATION
Renal Epithel, UA: NONE SEEN /HPF
Yeast, UA: NONE SEEN

## 2024-06-02 LAB — URINALYSIS, ROUTINE W REFLEX MICROSCOPIC
Glucose, UA: NEGATIVE
Nitrite, UA: NEGATIVE
Specific Gravity, UA: 1.015 (ref 1.005–1.030)
Urobilinogen, Ur: 0.2 mg/dL (ref 0.2–1.0)
pH, UA: 5.5 (ref 5.0–7.5)

## 2024-06-02 MED ORDER — AMOXICILLIN-POT CLAVULANATE 875-125 MG PO TABS: 1.0000 | ORAL_TABLET | Freq: Two times a day (BID) | ORAL | 0 refills | Status: DC

## 2024-06-04 ENCOUNTER — Other Ambulatory Visit: Payer: Self-pay | Admitting: Nurse Practitioner

## 2024-06-04 DIAGNOSIS — I480 Paroxysmal atrial fibrillation: Secondary | ICD-10-CM

## 2024-06-05 LAB — URINE CULTURE

## 2024-06-06 MED ORDER — APIXABAN 5 MG PO TABS: 5.0000 mg | ORAL_TABLET | Freq: Two times a day (BID) | ORAL | 0 refills | Status: DC

## 2024-06-06 NOTE — Telephone Encounter (Signed)
 Appt. Nov 6th with MMM

## 2024-06-06 NOTE — Addendum Note (Signed)
 Addended by: Ranald Alessio D on: 06/06/2024 11:30 AM   Modules accepted: Orders

## 2024-06-06 NOTE — Telephone Encounter (Signed)
 MMM pt NTBS 30-d given 05/09/24

## 2024-06-12 DIAGNOSIS — M7989 Other specified soft tissue disorders: Secondary | ICD-10-CM | POA: Insufficient documentation

## 2024-06-12 NOTE — Progress Notes (Unsigned)
  Remains in ardiology Office Note:   Date:  06/15/2024  ID:  Brooke Chavez, Brooke Chavez 21-May-1941, MRN [id number] PCP: Gladis Mustard, FNP   HeartCare Providers Cardiologist:  Lynwood Schilling, MD {  History of Present Illness:   Brooke Chavez is a 83 y.o. female who presents for follow up of atrial flutter.   She is s/p ablation and redo ablation.   Post procedure she had some paroxysms of probable fibrillation though these were a very brief period.  She has had lots of atrial ectopy and runs of atrial tachycardia. However, she had no sustained tachyarrhythmias. She had some bradycardic episodes but no sustained bradycardia arrhythmias. There were some junctional beats.  We have managed her medically for this.   She had severe MR.  On 01/01/18 she underwent minimally invasive MV repair, MAZE, and closure of PFO. She had been on Eliquis  pre op, Coumadin  was started post op. She was discharged 01/14/18. On 01/24/18 she presented to an outside hospital with a left brain stroke. Her INR on presentation was 1.7.     Since I last saw her she has done OK.  She does have some limited speech following her stroke.  She has weakness with some mild paresis.  She lives with a daughter and grandchild.  The patient denies any new symptoms such as chest discomfort, neck or arm discomfort. There has been no new shortness of breath, PND or orthopnea. There have been no reported palpitations, presyncope or syncope.   ROS: As stated in the HPI and negative for all other systems.  Studies Reviewed:    EKG:   EKG Interpretation Date/Time:  Wednesday June 15 2024 14:21:34 EST Ventricular Rate:  66 PR Interval:    QRS Duration:  138 QT Interval:  462 QTC Calculation: 484 R Axis:   -55  Text Interpretation: Atrial fibrillation Left axis deviation Left bundle branch block Confirmed by Schilling Lynwood ([id number]) on 06/15/2024 2:38:26 PM    Risk Assessment/Calculations:              Physical Exam:   VS:   BP (!) 110/50   Pulse 66   Ht 5' 3 (1.6 m)   Wt 145 lb (65.8 kg)   LMP 11/05/1992   BMI 25.69 kg/m    Wt Readings from Last 3 Encounters:  06/15/24 145 lb (65.8 kg)  05/31/24 143 lb 3.2 oz (65 kg)  05/13/24 145 lb (65.8 kg)     GEN: Well nourished, well developed in no acute distress NECK: No JVD; No carotid bruits CARDIAC: Irregular RR, no murmurs, rubs, gallops RESPIRATORY:  Clear to auscultation without rales, wheezing or rhonchi  ABDOMEN: Soft, non-tender, non-distended EXTREMITIES:  No edema; No deformity   ASSESSMENT AND PLAN:   ATRIAL FIBRILLATION.:     She has no symptoms related to this.  She has good rate control.  She tolerates the anticoagulation.  No change in therapy.did not   MITRAL REGURGITATION:   I will follow with an echo next year before I see her.    HYPERTENSION : Her blood pressure is at target.  No change in therapy.  BRADYCARDIA : She has no symptoms.  No change in therapy.   EDEMA: This has been mild and unchanged.  No change in therapy.  Follow up with me in 1 year after the echocardiogram.  Signed, Lynwood Schilling, MD

## 2024-06-15 ENCOUNTER — Encounter: Payer: Self-pay | Admitting: Cardiology

## 2024-06-15 ENCOUNTER — Ambulatory Visit (INDEPENDENT_AMBULATORY_CARE_PROVIDER_SITE_OTHER): Admitting: Cardiology

## 2024-06-15 VITALS — BP 110/50 | HR 66 | Ht 63.0 in | Wt 145.0 lb

## 2024-06-15 DIAGNOSIS — I4892 Unspecified atrial flutter: Secondary | ICD-10-CM

## 2024-06-15 DIAGNOSIS — I340 Nonrheumatic mitral (valve) insufficiency: Secondary | ICD-10-CM | POA: Diagnosis not present

## 2024-06-15 DIAGNOSIS — I10 Essential (primary) hypertension: Secondary | ICD-10-CM

## 2024-06-15 DIAGNOSIS — M7989 Other specified soft tissue disorders: Secondary | ICD-10-CM | POA: Diagnosis not present

## 2024-06-15 NOTE — Patient Instructions (Addendum)
 Medication Instructions:  Your physician recommends that you continue on your current medications as directed. Please refer to the Current Medication list given to you today.  Labwork: none  Testing/Procedures: Your physician has requested that you have an echocardiogram next year before your next visit 05/2025. Echocardiography is a painless test that uses sound waves to create images of your heart. It provides your doctor with information about the size and shape of your heart and how well your heart's chambers and valves are working. This procedure takes approximately one hour. There are no restrictions for this procedure. Please do NOT wear cologne, perfume, aftershave, or lotions (deodorant is allowed). Please arrive 15 minutes prior to your appointment time.  Please note: We ask at that you not bring children with you during ultrasound (echo/ vascular) testing. Due to room size and safety concerns, children are not allowed in the ultrasound rooms during exams. Our front office staff cannot provide observation of children in our lobby area while testing is being conducted. An adult accompanying a patient to their appointment will only be allowed in the ultrasound room at the discretion of the ultrasound technician under special circumstances. We apologize for any inconvenience.  Follow-Up: Your physician recommends that you schedule a follow-up appointment in: 1 year. You will receive a reminder call in about 8-10 months reminding you to schedule your appointment. If you don't receive this call, please contact our office.  Any Other Special Instructions Will Be Listed Below (If Applicable).  If you need a refill on your cardiac medications before your next appointment, please call your pharmacy.

## 2024-06-16 ENCOUNTER — Encounter: Payer: Self-pay | Admitting: Nurse Practitioner

## 2024-06-16 ENCOUNTER — Ambulatory Visit: Payer: PRIVATE HEALTH INSURANCE | Admitting: Nurse Practitioner

## 2024-06-16 ENCOUNTER — Ambulatory Visit (INDEPENDENT_AMBULATORY_CARE_PROVIDER_SITE_OTHER): Payer: Self-pay | Admitting: Nurse Practitioner

## 2024-06-16 VITALS — BP 137/68 | HR 75 | Temp 97.2°F | Ht 63.0 in | Wt 147.0 lb

## 2024-06-16 DIAGNOSIS — N1831 Chronic kidney disease, stage 3a: Secondary | ICD-10-CM

## 2024-06-16 DIAGNOSIS — I7409 Other arterial embolism and thrombosis of abdominal aorta: Secondary | ICD-10-CM

## 2024-06-16 DIAGNOSIS — I69351 Hemiplegia and hemiparesis following cerebral infarction affecting right dominant side: Secondary | ICD-10-CM

## 2024-06-16 DIAGNOSIS — Z6830 Body mass index (BMI) 30.0-30.9, adult: Secondary | ICD-10-CM

## 2024-06-16 DIAGNOSIS — I69398 Other sequelae of cerebral infarction: Secondary | ICD-10-CM

## 2024-06-16 DIAGNOSIS — I480 Paroxysmal atrial fibrillation: Secondary | ICD-10-CM

## 2024-06-16 DIAGNOSIS — K219 Gastro-esophageal reflux disease without esophagitis: Secondary | ICD-10-CM | POA: Diagnosis not present

## 2024-06-16 DIAGNOSIS — M810 Age-related osteoporosis without current pathological fracture: Secondary | ICD-10-CM | POA: Diagnosis not present

## 2024-06-16 DIAGNOSIS — F482 Pseudobulbar affect: Secondary | ICD-10-CM

## 2024-06-16 DIAGNOSIS — R001 Bradycardia, unspecified: Secondary | ICD-10-CM | POA: Diagnosis not present

## 2024-06-16 DIAGNOSIS — I10 Essential (primary) hypertension: Secondary | ICD-10-CM | POA: Diagnosis not present

## 2024-06-16 DIAGNOSIS — R600 Localized edema: Secondary | ICD-10-CM

## 2024-06-16 DIAGNOSIS — I69391 Dysphagia following cerebral infarction: Secondary | ICD-10-CM | POA: Diagnosis not present

## 2024-06-16 DIAGNOSIS — I5032 Chronic diastolic (congestive) heart failure: Secondary | ICD-10-CM | POA: Diagnosis not present

## 2024-06-16 DIAGNOSIS — F5101 Primary insomnia: Secondary | ICD-10-CM

## 2024-06-16 DIAGNOSIS — E785 Hyperlipidemia, unspecified: Secondary | ICD-10-CM

## 2024-06-16 MED ORDER — ROSUVASTATIN CALCIUM 10 MG PO TABS
10.0000 mg | ORAL_TABLET | Freq: Every day | ORAL | 1 refills | Status: AC
Start: 2024-06-16 — End: ?

## 2024-06-16 MED ORDER — LOSARTAN POTASSIUM 100 MG PO TABS
100.0000 mg | ORAL_TABLET | Freq: Every day | ORAL | 1 refills | Status: AC
Start: 2024-06-16 — End: ?

## 2024-06-16 MED ORDER — APIXABAN 5 MG PO TABS: 5.0000 mg | ORAL_TABLET | Freq: Two times a day (BID) | ORAL | 1 refills | Status: AC

## 2024-06-16 MED ORDER — AMLODIPINE BESYLATE 5 MG PO TABS
5.0000 mg | ORAL_TABLET | Freq: Every day | ORAL | 1 refills | Status: AC
Start: 2024-06-16 — End: ?

## 2024-06-16 MED ORDER — FUROSEMIDE 40 MG PO TABS: 40.0000 mg | ORAL_TABLET | Freq: Every day | ORAL | 1 refills | Status: AC

## 2024-06-16 MED ORDER — TRAZODONE HCL 50 MG PO TABS: 50.0000 mg | ORAL_TABLET | Freq: Every day | ORAL | 1 refills | Status: AC

## 2024-06-16 NOTE — Patient Instructions (Signed)
 Peripheral Edema  Peripheral edema is swelling that is caused by a buildup of fluid. Peripheral edema most often affects the lower legs, ankles, and feet. It can also develop in the arms, hands, and face. The area of the body that has peripheral edema will look swollen. It may also feel heavy or warm. Your clothes may start to feel tight. Pressing on the area may make a temporary dent in your skin (pitting edema). You may not be able to move your swollen arm or leg as much as usual. There are many causes of peripheral edema. It can happen because of a complication of other conditions such as heart failure, kidney disease, or a problem with your circulation. It also can be a side effect of certain medicines or happen because of an infection. It often happens to women during pregnancy. Sometimes, the cause is not known. Follow these instructions at home: Managing pain, stiffness, and swelling  Raise (elevate) your legs while you are sitting or lying down. Move around often to prevent stiffness and to reduce swelling. Do not sit or stand for long periods of time. Do not wear tight clothing. Do not wear garters on your upper legs. Exercise your legs to get your circulation going. This helps to move the fluid back into your blood vessels, and it may help the swelling go down. Wear compression stockings as told by your health care provider. These stockings help to prevent blood clots and reduce swelling in your legs. It is important that these are the correct size. These stockings should be prescribed by your doctor to prevent possible injuries. If elastic bandages or wraps are recommended, use them as told by your health care provider. Medicines Take over-the-counter and prescription medicines only as told by your health care provider. Your health care provider may prescribe medicine to help your body get rid of excess water (diuretic). Take this medicine if you are told to take it. General  instructions Eat a low-salt (low-sodium) diet as told by your health care provider. Sometimes, eating less salt may reduce swelling. Pay attention to any changes in your symptoms. Moisturize your skin daily to help prevent skin from cracking and draining. Keep all follow-up visits. This is important. Contact a health care provider if: You have a fever. You have swelling in only one leg. You have increased swelling, redness, or pain in one or both of your legs. You have drainage or sores at the area where you have edema. Get help right away if: You have edema that starts suddenly or is getting worse, especially if you are pregnant or have a medical condition. You develop shortness of breath, especially when you are lying down. You have pain in your chest or abdomen. You feel weak. You feel like you will faint. These symptoms may be an emergency. Get help right away. Call 911. Do not wait to see if the symptoms will go away. Do not drive yourself to the hospital. Summary Peripheral edema is swelling that is caused by a buildup of fluid. Peripheral edema most often affects the lower legs, ankles, and feet. Move around often to prevent stiffness and to reduce swelling. Do not sit or stand for long periods of time. Pay attention to any changes in your symptoms. Contact a health care provider if you have edema that starts suddenly or is getting worse, especially if you are pregnant or have a medical condition. Get help right away if you develop shortness of breath, especially when lying down.  This information is not intended to replace advice given to you by your health care provider. Make sure you discuss any questions you have with your health care provider. Document Revised: 04/01/2021 Document Reviewed: 04/01/2021 Elsevier Patient Education  2024 [employer].

## 2024-06-16 NOTE — Progress Notes (Signed)
 Subjective:    Patient ID: Brooke Chavez, female    DOB: [birth year], 83 y.o.   MRN: [id number]   Chief Complaint: medical management of chronic issues     HPI:  Brooke Chavez is a 83 y.o. who identifies as a female who was assigned female at birth.   Social history: Lives with: daughters Work history: disabled   Comes in today for follow up of the following chronic medical issues:  1. Essential hypertension No c/o chest pain, sob or headache. Does not check blood pressure at home. BP Readings from Last 3 Encounters:  06/15/24 (!) 110/50  05/31/24 102/60  05/13/24 132/88     2. Chronic diastolic congestive heart failure (HCC) 3. Junctional bradycardia 4. PAF (paroxysmal atrial fibrillation) (HCC) 5. Aortic occlusive disease Denies heart racing or palpitations. No syncopal  or near syncopal episodes. Last saw cardiology on 06/15/24. According to office note. No change in plan of care.  6. Dysphagia, post-stroke No issues  7. Gastroesophageal reflux disease without esophagitis On no meds. Has no recent complaints  8. 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 Gait disturbance, post-stroke 10. PBA (pseudobulbar affect) Is on eliquis  with no issues. She still cries occasionally but is doing well. She as had no recent falls despite hr gait issues. She uses walker to get around  11. Hyperlipidemia with target LDL less than 100 Has very poor appetite and will only eat a few things. Her diet does not change much from day to day. She does no exercise at all. Lab Results  Component Value Date   CHOL 187 10/19/2023   HDL 68 10/19/2023   LDLCALC 101 (H) 10/19/2023   TRIG 103 10/19/2023   CHOLHDL 2.8 10/19/2023     12. Stage 3a chronic kidney disease (HCC) No voiding issues Lab Results  Component Value Date   CREATININE 1.64 (H) 10/19/2023     13. Age-related osteoporosis without current pathological fracture No weight  bearing exercise. Last bone density test was in 2016. Dont feel that necessary  14. BMI 30.0-30.9,adult Weight is unchanged . Wt Readings from Last 3 Encounters:  06/15/24 145 lb (65.8 kg)  05/31/24 143 lb 3.2 oz (65 kg)  05/13/24 145 lb (65.8 kg)   BMI Readings from Last 3 Encounters:  06/15/24 25.69 kg/m  05/31/24 25.37 kg/m  05/13/24 25.69 kg/m      New complaints: None today  Allergies  Allergen Reactions   Oxycodone -Acetaminophen  Anxiety and Other (See Comments)    Hallucinations   Aspirin  Nausea And Vomiting and Nausea Only    Other reaction(s): GI Upset (intolerance)   Outpatient Encounter Medications as of 06/16/2024  Medication Sig   acetaminophen  (TYLENOL ) 325 MG tablet Take 2 tablets (650 mg total) by mouth every 6 (six) hours as needed for mild pain (or Fever >/= 101).   amLODipine  (NORVASC ) 5 MG tablet Take 1 tablet (5 mg total) by mouth daily.   apixaban  (ELIQUIS ) 5 MG TABS tablet Take 1 tablet (5 mg total) by mouth 2 (two) times daily.   furosemide  (LASIX ) 40 MG tablet Take 1 tablet (40 mg total) by mouth daily.   losartan  (COZAAR ) 100 MG tablet Take 1 tablet (100 mg total) by mouth daily.   rosuvastatin  (CRESTOR ) 10 MG tablet Take 1 tablet (10 mg total) by mouth daily.   traZODone  (DESYREL ) 50 MG tablet Take 1 tablet (50 mg total) by mouth at bedtime.   No facility-administered encounter medications  on file as of 06/16/2024.    Past Surgical History:  Procedure Laterality Date   A FLUTTER ABLATION N/A 06/27/2011   Procedure: ABLATION A FLUTTER;  Surgeon: Lynwood Rakers, MD;  Location: [facility] CATH LAB;  Service: Cardiovascular;  Laterality: N/A;   ATRIAL ABLATION SURGERY  06/2010   HIP SURGERY     Left (fracture) 3/13   JOINT REPLACEMENT     both knees    KNEE ARTHROSCOPY     Right   LUMBAR SPINE SURGERY     MITRAL VALVE REPAIR Right 01/07/2018   Procedure: MINIMALLY INVASIVE MITRAL VALVE REPAIR using LivaNova ring size 28 MM;  Surgeon: Dusty Sudie DEL,  MD;  Location: [facility] OR;  Service: Open Heart Surgery;  Laterality: Right;   PATENT FORAMEN OVALE(PFO) CLOSURE N/A 01/07/2018   Procedure: PATENT FORAMEN OVALE (PFO) CLOSURE;  Surgeon: Dusty Sudie DEL, MD;  Location: [facility] OR;  Service: Open Heart Surgery;  Laterality: N/A;   RIGHT/LEFT HEART CATH AND CORONARY ANGIOGRAPHY N/A 11/11/2017   Procedure: RIGHT/LEFT HEART CATH AND CORONARY ANGIOGRAPHY;  Surgeon: Wonda Sharper, MD;  Location: [facility] INVASIVE CV LAB;  Service: Cardiovascular;  Laterality: N/A;   TEE WITHOUT CARDIOVERSION N/A 09/18/2017   Procedure: TRANSESOPHAGEAL ECHOCARDIOGRAM (TEE);  Surgeon: Raford Riggs, MD;  Location: [facility] ENDOSCOPY;  Service: Cardiovascular;  Laterality: N/A;   TEE WITHOUT CARDIOVERSION N/A 01/07/2018   Procedure: TRANSESOPHAGEAL ECHOCARDIOGRAM (TEE);  Surgeon: Dusty Sudie DEL, MD;  Location: [facility] OR;  Service: Open Heart Surgery;  Laterality: N/A;   TOTAL KNEE ARTHROPLASTY     Left   TUBAL LIGATION      Family History  Problem Relation Age of Onset   Hypertension Mother    Cancer Sister        leukemia   Diabetes Brother    Stroke Sister    Diabetes Brother    Heart attack Brother    Healthy Daughter    Healthy Son    Healthy Daughter       Controlled substance contract: n/a     Review of Systems  Constitutional:  Negative for diaphoresis.  Eyes:  Negative for pain.  Respiratory:  Negative for shortness of breath.   Cardiovascular:  Negative for chest pain, palpitations and leg swelling.  Gastrointestinal:  Negative for abdominal pain.  Endocrine: Negative for polydipsia.  Skin:  Negative for rash.  Neurological:  Negative for dizziness, weakness and headaches.  Hematological:  Does not bruise/bleed easily.  All other systems reviewed and are negative.      Objective:   Physical Exam Vitals and nursing note reviewed.  Constitutional:      General: She is not in acute distress.    Appearance: Normal appearance. She is well-developed.  HENT:      Head: Normocephalic.     Right Ear: Tympanic membrane normal.     Left Ear: Tympanic membrane normal.     Nose: Nose normal.     Mouth/Throat:     Mouth: Mucous membranes are moist.  Eyes:     Pupils: Pupils are equal, round, and reactive to light.  Neck:     Vascular: No carotid bruit or JVD.  Cardiovascular:     Rate and Rhythm: Normal rate. Rhythm irregular.     Heart sounds: Normal heart sounds.  Pulmonary:     Effort: Pulmonary effort is normal. No respiratory distress.     Breath sounds: Normal breath sounds. No wheezing or rales.  Chest:     Chest wall: No tenderness.  Abdominal:     General: Bowel sounds are normal. There is no distension or abdominal bruit.     Palpations: Abdomen is soft. There is no hepatomegaly, splenomegaly, mass or pulsatile mass.     Tenderness: There is no abdominal tenderness.  Musculoskeletal:        General: Normal range of motion.     Cervical back: Normal range of motion and neck supple.     Right lower leg: Edema (2+) present.     Left lower leg: Edema (2+) present.     Comments: Gait slow and steady with walker  Lymphadenopathy:     Cervical: No cervical adenopathy.  Skin:    General: Skin is warm and dry.  Neurological:     Mental Status: She is alert and oriented to person, place, and time.     Deep Tendon Reflexes: Reflexes are normal and symmetric.  Psychiatric:        Behavior: Behavior normal.        Thought Content: Thought content normal.        Judgment: Judgment normal.     BP 137/68   Pulse 75   Temp (!) 97.2 F (36.2 C) (Temporal)   Ht 5' 3 (1.6 m)   Wt 147 lb (66.7 kg)   LMP 11/05/1992   BMI 26.04 kg/m     LMP 11/05/1992          Assessment & Plan:   Brooke Chavez comes in today with chief complaint of medical management of chronic issues    Diagnosis and orders addressed:  1. Essential hypertension Low sodium diet - amLODipine  (NORVASC ) 5 MG tablet; Take 1 tablet (5 mg total) by mouth  daily.  Dispense: 90 tablet; Refill: 1 - losartan  (COZAAR ) 100 MG tablet; Take 1 tablet (100 mg total) by mouth daily. (NEEDS TO BE SEEN BEFORE NEXT REFILL)  Dispense: 90 tablet; Refill: 1  2. Chronic diastolic congestive heart failure (HCC) 3. Junctional bradycardia 4. PAF (paroxysmal atrial fibrillation) (HCC) Keep followup with cardiology - apixaban  (ELIQUIS ) 5 MG TABS tablet; Take 1 tablet (5 mg total) by mouth 2 (two) times daily.  Dispense: 60 tablet; Refill: 5  5. Dysphagia, post-stroke Chew food well  6. Gastroesophageal reflux disease without esophagitis Avoid spicy foods Do not eat 2 hours prior to bedtime   7. 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 Gait disturbance, post-stroke Fall precautions  9. PBA (pseudobulbar affect)   10. Hyperlipidemia with target LDL less than 100 Low fat diet - rosuvastatin  (CRESTOR ) 10 MG tablet; Take 1 tablet (10 mg total) by mouth daily.  Dispense: 90 tablet; Refill: 1  11. Stage 3a chronic kidney disease (HCC) Labs pending  12. Age-related osteoporosis without current pathological fracture  13. BMI 30.0-30.9,adult Discussed diet and exercise for person with BMI >25 Will recheck weight in 3-6 months   14. Aortoiliac occlusive disease (HCC)  15. Peripheral edema Elevate legs when sitting - furosemide  (LASIX ) 40 MG tablet; Take 1 tablet (40 mg total) by mouth daily.  Dispense: 90 tablet; Refill: 1  16. Primary insomnia Bedtime routine - traZODone  (DESYREL ) 50 MG tablet; Take 1 tablet (50 mg total) by mouth at bedtime.  Dispense: 90 tablet; Refill: 1   Labs pending Health Maintenance reviewed Diet and exercise encouraged  Follow up plan: 6 months   Mary-Margaret Gladis, FNP

## 2024-06-20 ENCOUNTER — Other Ambulatory Visit: Payer: Self-pay | Admitting: Nurse Practitioner

## 2024-06-20 ENCOUNTER — Other Ambulatory Visit

## 2024-06-20 DIAGNOSIS — R2241 Localized swelling, mass and lump, right lower limb: Secondary | ICD-10-CM

## 2024-06-20 DIAGNOSIS — I10 Essential (primary) hypertension: Secondary | ICD-10-CM | POA: Diagnosis not present

## 2024-06-20 DIAGNOSIS — E785 Hyperlipidemia, unspecified: Secondary | ICD-10-CM | POA: Diagnosis not present

## 2024-06-20 NOTE — Progress Notes (Signed)
 Nontender nodule right lower ext  Orders Placed This Encounter  Procedures   US  Venous Img Lower Unilateral Right (DVT)    Standing Status:   Future    Expiration Date:   06/20/2025    Scheduling Instructions:     Within 2 days    Reason for Exam (SYMPTOM  OR DIAGNOSIS REQUIRED):   lower ext nodule    Preferred imaging location?:   [facility] [place], [place]

## 2024-06-21 ENCOUNTER — Ambulatory Visit: Payer: Self-pay | Admitting: Nurse Practitioner

## 2024-06-21 ENCOUNTER — Telehealth: Payer: Self-pay | Admitting: Nurse Practitioner

## 2024-06-21 LAB — CMP14+EGFR
ALT: 18 IU/L (ref 0–32)
AST: 25 IU/L (ref 0–40)
Albumin: 3.9 g/dL (ref 3.7–4.7)
Alkaline Phosphatase: 87 IU/L (ref 48–129)
BUN/Creatinine Ratio: 8 — ABNORMAL LOW (ref 12–28)
BUN: 18 mg/dL (ref 8–27)
Bilirubin Total: 0.4 mg/dL (ref 0.0–1.2)
CO2: 22 mmol/L (ref 20–29)
Calcium: 9.3 mg/dL (ref 8.7–10.3)
Chloride: 99 mmol/L (ref 96–106)
Creatinine, Ser: 2.13 mg/dL — ABNORMAL HIGH (ref 0.57–1.00)
Globulin, Total: 3.1 g/dL (ref 1.5–4.5)
Glucose: 103 mg/dL — ABNORMAL HIGH (ref 70–99)
Potassium: 4.3 mmol/L (ref 3.5–5.2)
Sodium: 141 mmol/L (ref 134–144)
Total Protein: 7 g/dL (ref 6.0–8.5)
eGFR: 23 mL/min/1.73 — ABNORMAL LOW (ref >59)

## 2024-06-21 LAB — CBC WITH DIFFERENTIAL/PLATELET
Basophils Absolute: 0 x10E3/uL (ref 0.0–0.2)
Basos: 0 %
EOS (ABSOLUTE): 0 x10E3/uL (ref 0.0–0.4)
Eos: 0 %
Hematocrit: 31.1 % — ABNORMAL LOW (ref 34.0–46.6)
Hemoglobin: 10.1 g/dL — ABNORMAL LOW (ref 11.1–15.9)
Immature Grans (Abs): 0 x10E3/uL (ref 0.0–0.1)
Immature Granulocytes: 0 %
Lymphocytes Absolute: 2 x10E3/uL (ref 0.7–3.1)
Lymphs: 46 %
MCH: 30.2 pg (ref 26.6–33.0)
MCHC: 32.5 g/dL (ref 31.5–35.7)
MCV: 93 fL (ref 79–97)
Monocytes Absolute: 0.3 x10E3/uL (ref 0.1–0.9)
Monocytes: 6 %
Neutrophils Absolute: 2.1 x10E3/uL (ref 1.4–7.0)
Neutrophils: 48 %
Platelets: 154 x10E3/uL (ref 150–450)
RBC: 3.34 x10E6/uL — ABNORMAL LOW (ref 3.77–5.28)
RDW: 15.2 % (ref 11.7–15.4)
WBC: 4.4 x10E3/uL (ref 3.4–10.8)

## 2024-06-21 LAB — LIPID PANEL
Chol/HDL Ratio: 2.4 ratio (ref 0.0–4.4)
Cholesterol, Total: 165 mg/dL (ref 100–199)
HDL: 69 mg/dL (ref >39)
LDL Chol Calc (NIH): 82 mg/dL (ref 0–99)
Triglycerides: 76 mg/dL (ref 0–149)
VLDL Cholesterol Cal: 14 mg/dL (ref 5–40)

## 2024-06-21 NOTE — Telephone Encounter (Unsigned)
 Copied from CRM [id number]. Topic: Clinical - Request for Lab/Test Order >> Jun 21, 2024  7:58 AM Donna BRAVO wrote: Reason for CRM: patient daughter Nat calling in regarding US  Venous Img Lower Unilateral Right (DVT) (Order [id number])  patient is available the latest time available on Tuesday or Thursday.

## 2024-06-24 ENCOUNTER — Telehealth: Payer: Self-pay

## 2024-06-24 NOTE — Telephone Encounter (Signed)
 This was placed in the STAT referral chat on Monday. Please review and advise

## 2024-06-24 NOTE — Telephone Encounter (Signed)
 Copied from CRM [id number]. Topic: Clinical - Request for Lab/Test Order >> Jun 24, 2024  4:10 PM Everette C wrote: Reason for CRM: The patient's daughter has called to follow up on their previous request for an Ultrasound of their right leg. The patient's family would like to discuss scheduling and scheduling preferences for the imaging appointment at [facility] on a Tuesday or Thursday in the late afternoon, if at all possible. Please contact further when available

## 2024-06-27 ENCOUNTER — Other Ambulatory Visit (HOSPITAL_COMMUNITY)

## 2024-06-27 NOTE — Telephone Encounter (Signed)
 Patient was scheduled for today but Daughter called back and rescheduled for 11/20.

## 2024-06-30 ENCOUNTER — Ambulatory Visit (HOSPITAL_COMMUNITY)
Admission: RE | Admit: 2024-06-30 | Discharge: 2024-06-30 | Disposition: A | Discharge status: Home / Self Care | Source: Ambulatory Visit | Attending: Nurse Practitioner | Admitting: Nurse Practitioner

## 2024-06-30 DIAGNOSIS — R2241 Localized swelling, mass and lump, right lower limb: Secondary | ICD-10-CM | POA: Insufficient documentation

## 2024-07-01 ENCOUNTER — Ambulatory Visit: Payer: Self-pay | Admitting: Nurse Practitioner

## 2024-08-25 ENCOUNTER — Inpatient Hospital Stay: Attending: Oncology | Admitting: Oncology

## 2024-08-26 ENCOUNTER — Ambulatory Visit: Payer: Medicare Other | Admitting: Oncology

## 2024-08-29 ENCOUNTER — Encounter: Payer: Self-pay | Admitting: Nurse Practitioner

## 2024-08-29 ENCOUNTER — Ambulatory Visit: Admitting: Nurse Practitioner

## 2024-08-29 VITALS — BP 136/59 | HR 79 | Temp 97.3°F

## 2024-08-29 DIAGNOSIS — R0989 Other specified symptoms and signs involving the circulatory and respiratory systems: Secondary | ICD-10-CM

## 2024-08-29 MED ORDER — AZITHROMYCIN 250 MG PO TABS: ORAL_TABLET | ORAL | 0 refills | Status: AC

## 2024-08-29 NOTE — Patient Instructions (Signed)

## 2024-08-29 NOTE — Progress Notes (Signed)
 "  Subjective:    Patient ID: Brooke Chavez, female    DOB: [birth year], 84 y.o.   MRN: [id number]   Chief Complaint: cough   Cough This is a new problem. The current episode started in the past 7 days. The problem has been waxing and waning. The problem occurs every few minutes. The cough is Non-productive. Associated symptoms include rhinorrhea. Pertinent negatives include no chills, fever, nasal congestion or shortness of breath. Treatments tried: corcidin. The treatment provided mild relief.    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)    Gait disturbance, post-stroke    Dysphagia, post-stroke    Advance care planning    Late effect of cerebrovascular accident (CVA) 01/24/2018   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  HENT:  Positive for rhinorrhea.   Respiratory:  Positive for cough. Negative for shortness of breath.        Objective:   Physical Exam Constitutional:      Appearance: Normal appearance.  HENT:     Right Ear: Tympanic membrane normal.     Left Ear: Tympanic membrane normal.      Nose: Congestion and rhinorrhea present.     Mouth/Throat:     Pharynx: No posterior oropharyngeal erythema.  Pulmonary:     Breath sounds: Rales (left lower lobe) present.  Skin:    General: Skin is warm.  Neurological:     General: No focal deficit present.     Mental Status: She is oriented to person, place, and time.  Psychiatric:        Mood and Affect: Mood normal.        Behavior: Behavior normal.    BP (!) 136/59   Pulse 79   Temp (!) 97.3 F (36.3 C) (Temporal)   LMP 11/05/1992         Dodi CHRISTELLA Cook in today with chief complaint of Cough and chest congestion   1. Rales (Primary) 1. Take meds as prescribed 2. Use a cool mist humidifier especially during the winter months and when heat has been humid. 3. Use saline nose sprays frequently 4. Saline irrigations of the nose can be very helpful if done frequently.  * 4X daily for 1 week*  * Use of a nettie pot can be helpful with this. Follow directions with this* 5. Drink plenty of fluids 6. Keep thermostat turn down low 7.For any cough or congestion- meucinex, delsym  or corcidin 8. For fever or aces or pains- take tylenol  or ibuprofen appropriate for age and weight.  * for fevers greater than 101 orally you may alternate ibuprofen and tylenol  every  3 hours.    - azithromycin  (ZITHROMAX  Z-PAK) 250 MG tablet; As directed  Dispense: 6 tablet; Refill: 0    The above assessment and management plan was discussed with the patient. The patient verbalized understanding of and has agreed to the management plan. Patient is aware to call the clinic if symptoms persist or worsen. Patient is aware when to return to the clinic for a follow-up visit. Patient educated on when it is appropriate to go to the emergency department.   Mary-Margaret Gladis, FNP    "

## 2024-10-06 ENCOUNTER — Ambulatory Visit: Payer: PRIVATE HEALTH INSURANCE

## 2024-11-03 ENCOUNTER — Ambulatory Visit: Payer: PRIVATE HEALTH INSURANCE

## 2024-12-13 ENCOUNTER — Ambulatory Visit: Admitting: Nurse Practitioner
